# Patient Record
Sex: Male | Born: 1970
Health system: Southern US, Community
[De-identification: ages and names within clinical notes are randomized; demographics above are authoritative.]

## PROBLEM LIST (undated history)

## (undated) DIAGNOSIS — D649 Anemia, unspecified: Secondary | ICD-10-CM

## (undated) DIAGNOSIS — N186 End stage renal disease: Secondary | ICD-10-CM

## (undated) DIAGNOSIS — I499 Cardiac arrhythmia, unspecified: Secondary | ICD-10-CM

## (undated) DIAGNOSIS — J449 Chronic obstructive pulmonary disease, unspecified: Secondary | ICD-10-CM

## (undated) DIAGNOSIS — Z720 Tobacco use: Secondary | ICD-10-CM

## (undated) DIAGNOSIS — I509 Heart failure, unspecified: Secondary | ICD-10-CM

## (undated) DIAGNOSIS — I10 Essential (primary) hypertension: Secondary | ICD-10-CM

## (undated) DIAGNOSIS — J45909 Unspecified asthma, uncomplicated: Secondary | ICD-10-CM

## (undated) DIAGNOSIS — I5032 Chronic diastolic (congestive) heart failure: Secondary | ICD-10-CM

## (undated) DIAGNOSIS — E669 Obesity, unspecified: Secondary | ICD-10-CM

## (undated) DIAGNOSIS — Z992 Dependence on renal dialysis: Secondary | ICD-10-CM

## (undated) HISTORY — DX: Essential (primary) hypertension: I10

## (undated) HISTORY — DX: End stage renal disease: N18.6

## (undated) HISTORY — DX: Anemia, unspecified: D64.9

## (undated) HISTORY — DX: Tobacco use: Z72.0

## (undated) HISTORY — DX: Obesity, unspecified: E66.9

## (undated) HISTORY — DX: Chronic obstructive pulmonary disease, unspecified: J44.9

## (undated) HISTORY — DX: Heart failure, unspecified: I50.9

## (undated) HISTORY — DX: Chronic diastolic (congestive) heart failure: I50.32

## (undated) HISTORY — DX: Dependence on renal dialysis: Z99.2

## (undated) SURGERY — Surgical Case
Anesthesia: *Unknown

---

## 2006-02-15 ENCOUNTER — Emergency Department (HOSPITAL_COMMUNITY): Admission: EM | Admit: 2006-02-15 | Discharge: 2006-02-15 | Payer: Self-pay | Admitting: Emergency Medicine

## 2006-03-02 ENCOUNTER — Emergency Department (HOSPITAL_COMMUNITY): Admission: EM | Admit: 2006-03-02 | Discharge: 2006-03-02 | Payer: Self-pay | Admitting: Emergency Medicine

## 2006-03-11 ENCOUNTER — Inpatient Hospital Stay (HOSPITAL_COMMUNITY): Admission: EM | Admit: 2006-03-11 | Discharge: 2006-03-14 | Payer: Self-pay | Admitting: Emergency Medicine

## 2007-10-12 ENCOUNTER — Ambulatory Visit (HOSPITAL_COMMUNITY): Admission: RE | Admit: 2007-10-12 | Discharge: 2007-10-12 | Payer: Self-pay | Admitting: Nephrology

## 2008-09-26 ENCOUNTER — Emergency Department (HOSPITAL_COMMUNITY): Admission: EM | Admit: 2008-09-26 | Discharge: 2008-09-26 | Payer: Self-pay | Admitting: Emergency Medicine

## 2009-05-24 DIAGNOSIS — I5032 Chronic diastolic (congestive) heart failure: Secondary | ICD-10-CM

## 2009-05-24 HISTORY — PX: ARTERIOVENOUS GRAFT PLACEMENT: SUR1029

## 2009-05-24 HISTORY — DX: Chronic diastolic (congestive) heart failure: I50.32

## 2009-08-02 ENCOUNTER — Inpatient Hospital Stay (HOSPITAL_COMMUNITY)
Admission: EM | Admit: 2009-08-02 | Discharge: 2009-08-07 | Payer: Self-pay | Source: Home / Self Care | Admitting: Emergency Medicine

## 2009-08-04 ENCOUNTER — Encounter (INDEPENDENT_AMBULATORY_CARE_PROVIDER_SITE_OTHER): Payer: Self-pay | Admitting: Nephrology

## 2009-09-01 ENCOUNTER — Ambulatory Visit: Payer: Self-pay | Admitting: Surgery

## 2009-10-14 ENCOUNTER — Ambulatory Visit: Payer: Self-pay | Admitting: Surgery

## 2009-11-05 ENCOUNTER — Ambulatory Visit: Payer: Self-pay | Admitting: Vascular Surgery

## 2009-11-05 ENCOUNTER — Ambulatory Visit (HOSPITAL_COMMUNITY)
Admission: RE | Admit: 2009-11-05 | Discharge: 2009-11-05 | Payer: Self-pay | Source: Home / Self Care | Admitting: Surgery

## 2009-12-25 ENCOUNTER — Ambulatory Visit: Payer: Self-pay | Admitting: Surgery

## 2010-02-11 DIAGNOSIS — I509 Heart failure, unspecified: Secondary | ICD-10-CM | POA: Insufficient documentation

## 2010-07-25 ENCOUNTER — Inpatient Hospital Stay (HOSPITAL_COMMUNITY)
Admission: EM | Admit: 2010-07-25 | Discharge: 2010-07-28 | DRG: 190 | Disposition: A | Discharge status: Home / Self Care | Payer: Medicaid Other | Attending: Internal Medicine | Admitting: Internal Medicine

## 2010-07-25 ENCOUNTER — Emergency Department (HOSPITAL_COMMUNITY): Payer: Medicaid Other

## 2010-07-25 DIAGNOSIS — I5043 Acute on chronic combined systolic (congestive) and diastolic (congestive) heart failure: Secondary | ICD-10-CM | POA: Diagnosis present

## 2010-07-25 DIAGNOSIS — I120 Hypertensive chronic kidney disease with stage 5 chronic kidney disease or end stage renal disease: Secondary | ICD-10-CM | POA: Diagnosis present

## 2010-07-25 DIAGNOSIS — N186 End stage renal disease: Secondary | ICD-10-CM | POA: Diagnosis present

## 2010-07-25 DIAGNOSIS — N039 Chronic nephritic syndrome with unspecified morphologic changes: Secondary | ICD-10-CM | POA: Diagnosis present

## 2010-07-25 DIAGNOSIS — D631 Anemia in chronic kidney disease: Secondary | ICD-10-CM | POA: Diagnosis present

## 2010-07-25 DIAGNOSIS — I509 Heart failure, unspecified: Secondary | ICD-10-CM | POA: Diagnosis present

## 2010-07-25 DIAGNOSIS — J441 Chronic obstructive pulmonary disease with (acute) exacerbation: Principal | ICD-10-CM | POA: Diagnosis present

## 2010-07-25 DIAGNOSIS — F17200 Nicotine dependence, unspecified, uncomplicated: Secondary | ICD-10-CM | POA: Diagnosis present

## 2010-07-25 LAB — BLOOD GAS, ARTERIAL
Acid-base deficit: 2.7 mmol/L — ABNORMAL HIGH (ref 0.0–2.0)
Bicarbonate: 22.2 mEq/L (ref 20.0–24.0)
Drawn by: 23534
FIO2: 0.21 %
O2 Content: 21 L/min
O2 Saturation: 77 %
Patient temperature: 37
TCO2: 20.4 mmol/L (ref 0–100)
pCO2 arterial: 42.7 mmHg (ref 35.0–45.0)
pH, Arterial: 7.336 — ABNORMAL LOW (ref 7.350–7.450)
pO2, Arterial: 45.4 mmHg — ABNORMAL LOW (ref 80.0–100.0)

## 2010-07-25 LAB — DIFFERENTIAL
Basophils Absolute: 0 10*3/uL (ref 0.0–0.1)
Basophils Relative: 0 % (ref 0–1)
Eosinophils Absolute: 0.3 10*3/uL (ref 0.0–0.7)
Eosinophils Relative: 3 % (ref 0–5)
Lymphocytes Relative: 7 % — ABNORMAL LOW (ref 12–46)
Lymphs Abs: 0.8 10*3/uL (ref 0.7–4.0)
Monocytes Absolute: 1 10*3/uL (ref 0.1–1.0)
Monocytes Relative: 9 % (ref 3–12)
Neutro Abs: 8.7 10*3/uL — ABNORMAL HIGH (ref 1.7–7.7)
Neutrophils Relative %: 81 % — ABNORMAL HIGH (ref 43–77)

## 2010-07-25 LAB — CBC
HCT: 37.8 % — ABNORMAL LOW (ref 39.0–52.0)
Hemoglobin: 11.9 g/dL — ABNORMAL LOW (ref 13.0–17.0)
MCH: 27.7 pg (ref 26.0–34.0)
MCHC: 31.5 g/dL (ref 30.0–36.0)
MCV: 88.1 fL (ref 78.0–100.0)
Platelets: 193 10*3/uL (ref 150–400)
RBC: 4.29 MIL/uL (ref 4.22–5.81)
RDW: 15.7 % — ABNORMAL HIGH (ref 11.5–15.5)
WBC: 10.8 10*3/uL — ABNORMAL HIGH (ref 4.0–10.5)

## 2010-07-25 LAB — CARDIAC PANEL(CRET KIN+CKTOT+MB+TROPI)
CK, MB: 15.3 ng/mL (ref 0.3–4.0)
Relative Index: 1.6 (ref 0.0–2.5)
Total CK: 959 U/L — ABNORMAL HIGH (ref 7–232)
Troponin I: 0.04 ng/mL (ref 0.00–0.06)

## 2010-07-25 LAB — PROTIME-INR
INR: 1.06 (ref 0.00–1.49)
Prothrombin Time: 14 seconds (ref 11.6–15.2)

## 2010-07-25 LAB — GLUCOSE, CAPILLARY: Glucose-Capillary: 163 mg/dL — ABNORMAL HIGH (ref 70–99)

## 2010-07-25 LAB — BRAIN NATRIURETIC PEPTIDE: Pro B Natriuretic peptide (BNP): 530 pg/mL — ABNORMAL HIGH (ref 0.0–100.0)

## 2010-07-25 LAB — BASIC METABOLIC PANEL
BUN: 50 mg/dL — ABNORMAL HIGH (ref 6–23)
CO2: 23 mEq/L (ref 19–32)
Calcium: 9 mg/dL (ref 8.4–10.5)
Chloride: 106 mEq/L (ref 96–112)
Creatinine, Ser: 5.7 mg/dL — ABNORMAL HIGH (ref 0.4–1.5)
GFR calc Af Amer: 13 mL/min — ABNORMAL LOW (ref >60)
GFR calc non Af Amer: 11 mL/min — ABNORMAL LOW (ref >60)
Glucose, Bld: 117 mg/dL — ABNORMAL HIGH (ref 70–99)
Potassium: 4.3 mEq/L (ref 3.5–5.1)
Sodium: 139 mEq/L (ref 135–145)

## 2010-07-25 LAB — APTT: aPTT: 34 seconds (ref 24–37)

## 2010-07-25 LAB — POCT CARDIAC MARKERS
CKMB, poc: 12.4 ng/mL (ref 1.0–8.0)
Myoglobin, poc: >500 ng/mL (ref 12–200)
Troponin i, poc: <0.05 ng/mL (ref 0.00–0.09)

## 2010-07-25 LAB — CK TOTAL AND CKMB (NOT AT ARMC)
CK, MB: 15.5 ng/mL (ref 0.3–4.0)
Relative Index: 1.6 (ref 0.0–2.5)
Total CK: 998 U/L — ABNORMAL HIGH (ref 7–232)

## 2010-07-25 LAB — TROPONIN I: Troponin I: 0.03 ng/mL (ref 0.00–0.06)

## 2010-07-25 LAB — MRSA PCR SCREENING: MRSA by PCR: NEGATIVE

## 2010-07-26 LAB — GLUCOSE, CAPILLARY
Glucose-Capillary: 169 mg/dL — ABNORMAL HIGH (ref 70–99)
Glucose-Capillary: 174 mg/dL — ABNORMAL HIGH (ref 70–99)
Glucose-Capillary: 136 mg/dL — ABNORMAL HIGH (ref 70–99)
Glucose-Capillary: 140 mg/dL — ABNORMAL HIGH (ref 70–99)

## 2010-07-26 LAB — CBC
HCT: 36 % — ABNORMAL LOW (ref 39.0–52.0)
Hemoglobin: 11.1 g/dL — ABNORMAL LOW (ref 13.0–17.0)
MCH: 27.5 pg (ref 26.0–34.0)
MCHC: 30.8 g/dL (ref 30.0–36.0)
MCV: 89.3 fL (ref 78.0–100.0)
Platelets: 184 10*3/uL (ref 150–400)
RBC: 4.03 MIL/uL — ABNORMAL LOW (ref 4.22–5.81)
RDW: 15.7 % — ABNORMAL HIGH (ref 11.5–15.5)
WBC: 8.4 10*3/uL (ref 4.0–10.5)

## 2010-07-26 LAB — URINE MICROSCOPIC-ADD ON

## 2010-07-26 LAB — URINALYSIS, ROUTINE W REFLEX MICROSCOPIC
Bilirubin Urine: NEGATIVE
Glucose, UA: NEGATIVE mg/dL
Ketones, ur: NEGATIVE mg/dL
Leukocytes, UA: NEGATIVE
Nitrite: NEGATIVE
Protein, ur: 100 mg/dL — AB
Specific Gravity, Urine: 1.025 (ref 1.005–1.030)
Urobilinogen, UA: 0.2 mg/dL (ref 0.0–1.0)
pH: 5 (ref 5.0–8.0)

## 2010-07-26 LAB — CARDIAC PANEL(CRET KIN+CKTOT+MB+TROPI)
CK, MB: 12.6 ng/mL (ref 0.3–4.0)
CK, MB: 12.7 ng/mL (ref 0.3–4.0)
Relative Index: 1.5 (ref 0.0–2.5)
Relative Index: 1.7 (ref 0.0–2.5)
Total CK: 731 U/L — ABNORMAL HIGH (ref 7–232)
Total CK: 854 U/L — ABNORMAL HIGH (ref 7–232)
Troponin I: 0.03 ng/mL (ref 0.00–0.06)
Troponin I: 0.04 ng/mL (ref 0.00–0.06)

## 2010-07-26 LAB — RENAL FUNCTION PANEL
Albumin: 3.7 g/dL (ref 3.5–5.2)
BUN: 58 mg/dL — ABNORMAL HIGH (ref 6–23)
CO2: 23 mEq/L (ref 19–32)
Calcium: 9 mg/dL (ref 8.4–10.5)
Chloride: 108 mEq/L (ref 96–112)
Creatinine, Ser: 6.03 mg/dL — ABNORMAL HIGH (ref 0.4–1.5)
GFR calc Af Amer: 13 mL/min — ABNORMAL LOW (ref >60)
GFR calc non Af Amer: 10 mL/min — ABNORMAL LOW (ref >60)
Glucose, Bld: 147 mg/dL — ABNORMAL HIGH (ref 70–99)
Phosphorus: 4.2 mg/dL (ref 2.3–4.6)
Potassium: 5 mEq/L (ref 3.5–5.1)
Sodium: 142 mEq/L (ref 135–145)

## 2010-07-26 LAB — RAPID URINE DRUG SCREEN, HOSP PERFORMED
Amphetamines: NOT DETECTED
Barbiturates: NOT DETECTED
Benzodiazepines: NOT DETECTED
Cocaine: NOT DETECTED
Opiates: NOT DETECTED
Tetrahydrocannabinol: NOT DETECTED

## 2010-07-26 LAB — BRAIN NATRIURETIC PEPTIDE: Pro B Natriuretic peptide (BNP): 515 pg/mL — ABNORMAL HIGH (ref 0.0–100.0)

## 2010-07-27 LAB — CBC
HCT: 36.3 % — ABNORMAL LOW (ref 39.0–52.0)
HCT: 36.6 % — ABNORMAL LOW (ref 39.0–52.0)
Hemoglobin: 11.3 g/dL — ABNORMAL LOW (ref 13.0–17.0)
Hemoglobin: 11.3 g/dL — ABNORMAL LOW (ref 13.0–17.0)
MCH: 27.9 pg (ref 26.0–34.0)
MCH: 27.6 pg (ref 26.0–34.0)
MCHC: 31.1 g/dL (ref 30.0–36.0)
MCHC: 30.9 g/dL (ref 30.0–36.0)
MCV: 89.6 fL (ref 78.0–100.0)
MCV: 89.3 fL (ref 78.0–100.0)
Platelets: 185 10*3/uL (ref 150–400)
Platelets: 181 10*3/uL (ref 150–400)
RBC: 4.05 MIL/uL — ABNORMAL LOW (ref 4.22–5.81)
RBC: 4.1 MIL/uL — ABNORMAL LOW (ref 4.22–5.81)
RDW: 15.7 % — ABNORMAL HIGH (ref 11.5–15.5)
RDW: 15.8 % — ABNORMAL HIGH (ref 11.5–15.5)
WBC: 11.2 10*3/uL — ABNORMAL HIGH (ref 4.0–10.5)
WBC: 10.1 10*3/uL (ref 4.0–10.5)

## 2010-07-27 LAB — DIFFERENTIAL
Basophils Absolute: 0 10*3/uL (ref 0.0–0.1)
Basophils Relative: 0 % (ref 0–1)
Eosinophils Absolute: 0 10*3/uL (ref 0.0–0.7)
Eosinophils Relative: 0 % (ref 0–5)
Lymphocytes Relative: 6 % — ABNORMAL LOW (ref 12–46)
Lymphs Abs: 0.6 10*3/uL — ABNORMAL LOW (ref 0.7–4.0)
Monocytes Absolute: 0.3 10*3/uL (ref 0.1–1.0)
Monocytes Relative: 3 % (ref 3–12)
Neutro Abs: 9.2 10*3/uL — ABNORMAL HIGH (ref 1.7–7.7)
Neutrophils Relative %: 92 % — ABNORMAL HIGH (ref 43–77)

## 2010-07-27 LAB — RENAL FUNCTION PANEL
Albumin: 3.6 g/dL (ref 3.5–5.2)
Albumin: 3.7 g/dL (ref 3.5–5.2)
BUN: 75 mg/dL — ABNORMAL HIGH (ref 6–23)
BUN: 80 mg/dL — ABNORMAL HIGH (ref 6–23)
CO2: 22 mEq/L (ref 19–32)
CO2: 20 mEq/L (ref 19–32)
Calcium: 8.6 mg/dL (ref 8.4–10.5)
Calcium: 8.5 mg/dL (ref 8.4–10.5)
Chloride: 104 mEq/L (ref 96–112)
Chloride: 104 mEq/L (ref 96–112)
Creatinine, Ser: 6.46 mg/dL — ABNORMAL HIGH (ref 0.4–1.5)
Creatinine, Ser: 6.67 mg/dL — ABNORMAL HIGH (ref 0.4–1.5)
GFR calc Af Amer: 12 mL/min — ABNORMAL LOW (ref >60)
GFR calc Af Amer: 11 mL/min — ABNORMAL LOW (ref >60)
GFR calc non Af Amer: 10 mL/min — ABNORMAL LOW (ref >60)
GFR calc non Af Amer: 9 mL/min — ABNORMAL LOW (ref >60)
Glucose, Bld: 179 mg/dL — ABNORMAL HIGH (ref 70–99)
Glucose, Bld: 213 mg/dL — ABNORMAL HIGH (ref 70–99)
Phosphorus: 3.9 mg/dL (ref 2.3–4.6)
Phosphorus: 5 mg/dL — ABNORMAL HIGH (ref 2.3–4.6)
Potassium: 4.9 mEq/L (ref 3.5–5.1)
Potassium: 4.6 mEq/L (ref 3.5–5.1)
Sodium: 138 mEq/L (ref 135–145)
Sodium: 137 mEq/L (ref 135–145)

## 2010-07-27 LAB — GLUCOSE, CAPILLARY
Glucose-Capillary: 136 mg/dL — ABNORMAL HIGH (ref 70–99)
Glucose-Capillary: 189 mg/dL — ABNORMAL HIGH (ref 70–99)
Glucose-Capillary: 174 mg/dL — ABNORMAL HIGH (ref 70–99)

## 2010-07-28 LAB — HEPATITIS C ANTIBODY: HCV Ab: NEGATIVE

## 2010-07-28 LAB — BASIC METABOLIC PANEL
BUN: 90 mg/dL — ABNORMAL HIGH (ref 6–23)
CO2: 21 mEq/L (ref 19–32)
Calcium: 8.2 mg/dL — ABNORMAL LOW (ref 8.4–10.5)
Chloride: 102 mEq/L (ref 96–112)
Creatinine, Ser: 6.82 mg/dL — ABNORMAL HIGH (ref 0.4–1.5)
GFR calc Af Amer: 11 mL/min — ABNORMAL LOW (ref >60)
GFR calc non Af Amer: 9 mL/min — ABNORMAL LOW (ref >60)
Glucose, Bld: 169 mg/dL — ABNORMAL HIGH (ref 70–99)
Potassium: 4.7 mEq/L (ref 3.5–5.1)
Sodium: 133 mEq/L — ABNORMAL LOW (ref 135–145)

## 2010-07-28 LAB — CULTURE, RESPIRATORY W GRAM STAIN: Culture: NORMAL

## 2010-07-28 LAB — CBC
HCT: 36.2 % — ABNORMAL LOW (ref 39.0–52.0)
Hemoglobin: 11.3 g/dL — ABNORMAL LOW (ref 13.0–17.0)
MCH: 27.8 pg (ref 26.0–34.0)
MCHC: 31.2 g/dL (ref 30.0–36.0)
MCV: 89.2 fL (ref 78.0–100.0)
Platelets: 200 10*3/uL (ref 150–400)
RBC: 4.06 MIL/uL — ABNORMAL LOW (ref 4.22–5.81)
RDW: 15.8 % — ABNORMAL HIGH (ref 11.5–15.5)
WBC: 9.5 10*3/uL (ref 4.0–10.5)

## 2010-07-28 LAB — GLUCOSE, CAPILLARY
Glucose-Capillary: 162 mg/dL — ABNORMAL HIGH (ref 70–99)
Glucose-Capillary: 153 mg/dL — ABNORMAL HIGH (ref 70–99)
Glucose-Capillary: 175 mg/dL — ABNORMAL HIGH (ref 70–99)

## 2010-07-28 LAB — HEPATITIS B SURFACE ANTIGEN: Hepatitis B Surface Ag: NEGATIVE

## 2010-07-28 LAB — HEPATITIS B CORE ANTIBODY, IGM: Hep B C IgM: NEGATIVE

## 2010-08-02 NOTE — Consult Note (Signed)
NAMERIGOVERTO, STONEBARGER             ACCOUNT NO.:  [id number]  MEDICAL RECORD NO.:  [id number]           PATIENT TYPE:  I  LOCATION:  [id number]                          FACILITY:  [facility]  PHYSICIAN:  Alison Murray, M.D.DATE OF BIRTH:  [birth year]  DATE OF CONSULTATION: DATE OF DISCHARGE:                                CONSULTATION   REASON FOR CONSULTATION:  Worsening of renal failure.  Brian Barrera is a 40 year old gentleman with a history of hypertension and a history of worsening diastolic dysfunction with ejection fraction of 75% and a history of obesity presently who came to the emergency room with complaints of shortness of breath, orthopnea of 2-3 days' duration. According to the patient, also he has cough with yellowish sputum production.  Presently, he said he is feeling better.  He denies any nausea.  No vomiting.  Appetite is overall good.  PAST MEDICAL HISTORY: 1. He has a history of chronic renal failure, stage IV with baseline    creatinine about 5.1 to 5.6 and his GFR calculated was between 12-     13. 2. History of hypertension, usually not controlled that well, most of     it is because of financial issues, and he does not get his     medications. 3. History of diastolic dysfunction. 4. History of systolic dysfunction with ejection fraction of 75%. 5. History of obesity. 6. History of asthma/COPD. 7. History of unequal kidney. 8. History of anemia, possibly related to his renal insufficiency. 9. History of hypertension, the patient is on multiple medications. 10.History of secondary hyperparathyroidism.  Medications at this moment consist of albuterol inhaler 2.5 mg every 6 hours, Norvasc 10 mg p.o. daily, aspirin 81 mg p.o. daily, clonidine 0.3 mg p.o. t.i.d., Mucinex 600 mg p.o. b.i.d., heparin 5000 units subcu q.8 h., and hydralazine 50 mg p.o. t.i.d.  He is also on insulin.  He is on Atrovent 0.5 mg q.6 h., Solu-Medrol 60 mg IV q.12 h., Avelox 400 mg  p.o. daily, Renagel 800 mg p.o. t.i.d. with each meal.  Other medications at this moment are p.r.n. medications.  SOCIAL HISTORY:  He has history of still smoking.  He smokes roughly about a little lesser than a pack a day.  He does not have any history of illicit drug use.  He does not have also any history of alcohol abuse.  FAMILY HISTORY:  His mother used to be on dialysis.  REVIEW OF SYSTEMS:  He seems to be feeling better, but he came with shortness of breath and also orthopnea and paroxysmal nocturnal dyspnea. This is also associated with cough with yellowish sputum production.  He denies any fevers, chills, or sweating.  He denies any chest pain.  He does not have any nausea or vomiting.  Appetite is good.  He does not have any swelling of the legs.  PHYSICAL EXAMINATION:  VITAL SIGNS:  Temperature is 97.2, pulse was 91, blood pressure 170/93. CHEST:  Decreased breath sounds, otherwise seems to be clear.  No rales, no rhonchi.  No egophony.  However, he has expiratory crackles.  He has also some expiratory wheezing. HEART:  Regular  rate and rhythm.  No murmur.  No S3. ABDOMEN:  Positive bowel sounds. EXTREMITIES:  He does not have any edema.  Blood work, his white blood cell count is 10.1, hemoglobin 11.3, hematocrit 76.6, and platelet of 181.  Sodium is 147, potassium 4.6, BUN is 80, creatinine is 6.67.  His albumin is 3.7, calcium 8.5, and phosphorus of 5.  His BNP is 5015.  He has chest x-ray, which showed cardiomegaly with vascular congestion.  ASSESSMENT: 1. Congestive heart failure, probably multifactorial including     uncontrolled salt and fluid intake and also, the patient has severe     post diastolic and systolic dysfunction and presently came with     shortness of breath, and he was found to have congestive heart     failure.  He is on diuretics.  However, the urine output is not     documented.  Hence, very difficult to know how much he has lost.      However, the patient states that he is feeling better. 2. History of asthma/chronic obstructive pulmonary disease.  He is on     inhaler and also he is on antibiotics.  Presently, seems to be     feeling better also. 3. History of hypertension.  The patient is on hydralazine 50 mg p.o.     t.i.d., he is also on clonidine 0.3 mg p.o. b.i.d., amlodipine 10     mg p.o. daily.  Blood pressure still does not seem to be     controlled. 4. History of anemia, thought to be secondary to chronic renal     failure. 5. History of morbid obesity. 6. History of unequal kidney. 7. History of hyperphosphatemia.  He is on a binder.  RECOMMENDATIONS:  At this moment, I agree with diuretics, probably we will use Demadex oral.  I have discussed with him also because of worsening of renal failure plus the patient having severe cardiomyopathy with recurrent CHF, probably he may benefit from dialysis.  At this moment since the patient did not make any decisions, we will follow his blood work and we will follow his input and output.  We will check his hepatitis B surface antigen and also hepatitis surface antibody and core antibody plus we will check also PPD.  We will talk with him tomorrow. If he agrees with dialysis, probably we will continue with that.     Alison Murray, M.D.     BB/MEDQ  D:  07/27/2010  T:  07/28/2010  Job:  [id number]  Electronically Signed by Alison Murray M.D. on 08/02/2010 08:35:06 AM

## 2010-08-04 NOTE — Discharge Summary (Signed)
Brian Barrera, Brian Barrera             ACCOUNT NO.:  [id number]  MEDICAL RECORD NO.:  [id number]           PATIENT TYPE:  I  LOCATION:  [id number]                          FACILITY:  [facility]  PHYSICIAN:  Niel Hummer, MD    DATE OF BIRTH:  14-Jun-1970  DATE OF ADMISSION:  07/25/2010 DATE OF DISCHARGE:  03/06/2012LH                              DISCHARGE SUMMARY   DISCHARGE DIAGNOSES: 1. Asthma/chronic obstructive pulmonary disease exacerbation. 2. Respiratory distress secondary to asthma, chronic obstructive     pulmonary disease exacerbation. 3. End-stage renal disease. 4. Mild exacerbation of systolic and diastolic heart failure, ejection     fraction 35% by 2-D echo in 2011. 5. Uncontrolled hypertension. 6. Elevation of CK-MB and total CK likely secondary to renal failure.     Decreased renal excretion.  OTHER PAST MEDICAL HISTORY: 1. Morbid obesity. 2. Tobacco abuse. 3. Anemia of chronic disease. 4. History of asthma. 5. Prior history of hypertensive encephalopathy.  DISCHARGE MEDICATIONS: 1. Combivent inhaler every 6 hours. 2. Lasix 40 mg tablet 100 mg b.i.d. 3. Guaifenesin 600 mg p.o. b.i.d. 4. Moxifloxacin 400 mg p.o. daily. 5. Prednisone take 3 tablets for 3 days, then 2 tablets for 2 days,     then 1 tablet for 1 day, then half tablet, then stop. 6. Clonidine 0.3 mg p.o.  t.i.d. 7. Aspirin 81 mg p.o. daily. 8. Hydralazine 50 mg one tablet by mouth 3 times a day. 9. Norvasc 10 mg one tablet by mouth daily. 10.Renvela 800 mg one tablet by mouth 3 times a day.  MEDICATIONS:  Stopped during this hospitalization:  Labetalol, potassium chloride, and chlorpheniramine.  DISPOSITION AND FOLLOWUP:  [name] will need to follow with Dr. Lowanda Foster within 1 week.  He will need a BMET to follow renal function and potassium level.  He will be referred to the health self.  For further evaluation of COPD, asthma.  He will need a pulmonary function test.  He had a PPD that was placed  on July 28, 2010.  He was instructed to go to Dr. Florentina Addison office to have reader to go with the emergency department.  BRIEF HISTORY OF PRESENT ILLNESS:  This is a 40 year old with past medical history of end-stage renal disease, systolic, and diastolic heart failure, ejection fraction 25%.  Prior history of asthma, current smoker who presents complaining of shortness of breath.  He related productive cough yellow sputum that started the night prior to admission.  He is having shortness of breath and has been progressively getting worse accompanied by wheezing.  He was using albuterol inhaler that he got from his girlfriend, he had some relief with albuterol use. He relates some chest pain when he coughs.  STUDIES PERFORMED:  Chest x-ray show cardiomegaly with vascular congestion.  CONSULTANT:  Nephrologist, Dr. Lowanda Foster.  HOSPITAL COURSE: 1. Respiratory failure.  The patient presented with shortness of     breath very tachypneic, it was thought to be multifactorial     secondary to mild CHF exacerbation plus a component of asthma and     COPD exacerbation.  The patient was admitted to step-down unit.  He  was started on Solu-Medrol, albuterol, ipratropium, and he received     a dose of Lasix.  During hospitalization, his respiratory failure     resolved.  His oxygen saturation on the date of discharge was at 96     on room air. 2. Asthma and COPD exacerbation.  He will be discharged on Combivent.     He will need to follow up with primary care physician.  Followup     arrangement will be made.  He will need a pulmonary function test.     He was counseled regarding smoking. 3. Hypertension.  uncontrol will continue with hydralazine,     clonidine, amlodipine.  A clonidine was increased to t.i.d..  He     will need to follow with his nephrologist for further adjustment of     medications.  Labetalol was stopped because of acute asthma     exacerbation.  We will need to wait  the patient asthma and COPD be     better controlled prior to starting him back on Labetalol. 4. End-stage renal disease.  The patient received a dose of Lasix for     mild CHF exacerbation.  Because during this hospitalization his      creatinine increased and his renal function was worsening,     Dr. Lowanda Foster was consulted.  He recommended start Lasix 100 mg p.o.     b.i.d.  He discussed with the patient option for dialysis but the     patient has declined at this time.  The patient will follow up with     Dr. Lowanda Foster for further care. 5. History of systolic and diastolic heart failure.  The patient was     restarted on Lasix 100 mg p.o. b.i.d.  No shortness of breath,     heart failure at this time compensated.  On the day of discharge,     the patient was in improved condition.  He denies shortness of     breath.  He is feeling very well.  Blood pressure 156/80, sat 96 on     room air, respirations 20, pulse 82, temperature 98.2.  Hepatitis B     core antibody negative.  CBC white blood cell 9.5, hemoglobin 11.3,     hematocrit 36.2, platelets 200.  Sodium 133, potassium 4.7,     chloride 102, CO2 of 21, glucose 169, BUN 90, and creatinine 6.18.     The patient was discharged in improved condition.     Niel Hummer, MD     BR/MEDQ  D:  07/28/2010  T:  07/29/2010  Job:  [id number]  Electronically Signed by Niel Hummer MD on 08/04/2010 09:39:37 AM

## 2010-08-04 NOTE — H&P (Signed)
NAMEBREN, Brian Barrera             ACCOUNT NO.:  [id number]  MEDICAL RECORD NO.:  [id number]           PATIENT TYPE:  I  LOCATION:  IC02                          FACILITY:  [facility]  PHYSICIAN:  Niel Hummer, MD    DATE OF BIRTH:  1970-12-24  DATE OF ADMISSION:  07/25/2010 DATE OF DISCHARGE:  LH                             HISTORY & PHYSICAL   PRIMARY CARE PHYSICIAN:  No physician.  NEPHROLOGIST:  Alison Murray, MD.  CHIEF COMPLAINT:  Shortness of breath.  HISTORY OF PRESENT ILLNESS:  This is a 40 year old African-American with past medical history significant for systolic and diastolic heart failure, ejection fraction at 35% by 2-D echo on March 2011, a history of asthma, current smoker, end-stage renal disease, not on dialysis, who presented to the emergency department complaining of shortness of breath.  The patient relates cough productive yellow sputum that started the night prior to admission.  He said to have shortness of breath. Also that started the night prior to admission is that they are progressively getting worse.  Shortness of breath has been getting progressively worse accompanied by wheezing.  He use albuterol inhaler that he got from his girlfriend.  He use it twice with some relief.  He relates some chest tightness and abdominal pain when he cough.  He denies fever or diarrhea.  Denies worsening lower extremity edema.  He has been taken his medications.  In the emergency department, he received nitroglycerin ointment, albuterol, and prednisone.  He relates improvement after medications were provided.  ALLERGIES:  No known drug allergies.  PAST MEDICAL HISTORY: 1. Systolic and diastolic heart failure, ejection fraction 35%, on 2-D     echo on March 2011. 2. End-stage renal disease. 3. Hypertension. 4. Morbid obesity. 5. Tobacco abuse. 6. Anemia of chronic disease. 7. History of asthma. 8. Prior history of hypertensive encephalopathy.  HOME  MEDICATIONS: 1. Hydralazine 50 mg p.o. t.i.d. 2. Clonidine 0.3 mg p.o. b.i.d. 3. Renvela 800 mg. 4. Amlodipine 10 mg. 5. Labetalol 200 mg p.o. b.i.d. 6. Zemplar 1 mcg. 7. Potassium supplement.  SOCIAL HISTORY:  He is on disability.  He denies alcohol or recreational drugs.  He is current smoker, smokes half a pack per day for the last 20 years.  He is single.  He has a girlfriend, he has three children.  FAMILY HISTORY:  Mother and father are deceased.  Her mother had a prior history of diabetes and hypertension.  Father died of lung disease.  He was not able to specify.  REVIEW OF SYSTEMS:  Negative except as per HPI.  PHYSICAL EXAMINATION:  VITALS:  Sat 94 on 2 L, pulse 100-99, blood pressure 183/91, respirations 24, temperature 98.8. GENERAL:  The patient lying in bed, in no acute distress, speaking in full sentences, not in acute distress. HEENT:  Head, atraumatic and normocephalic.  Eyes, anicteric.  Pupils equal and reactive to light.  Extraocular muscle intact. NECK:  Supple.  JVD difficult to assess due to thick neck, no thyromegaly. CARDIOVASCULAR:  S1 and S2, regular rhythm and rate.  No rub, murmurs, or gallops. LUNGS:  Bilateral diffuse wheezes and bilateral  crackles at the bases. ABDOMEN:  Bowel sounds positive, soft, nontender, and nondistended.  No rigidity, no guarding. EXTREMITY:  Pulse present +1 edema. NEURO EXAM:  Nonfocal.  LABORATORY DATA:  Admission labs, troponin 0.03, sodium 139, potassium 4.3, chloride 106, bicarb 23, glucose 117, BUN 50, creatinine 5.7, calcium 9.0, total CK 998, CK-MB 15.5.  ABG, it was reported as an arterial, but ED physician said that it was venous, pH 7.3, PCO2 of 42, PO2 of 45.  BNP 530.  CBC:  White blood cell 10.8, hemoglobin 11.9, platelet 193, troponin 0.05.  RADIOGRAPHIC STUDIES:  Chest x-ray:  Cardiomegaly with vascular congestion.  ASSESSMENT AND PLAN:  This is a 40 year old that presented with productive cough,  shortness of breath, wheezing on lung exam. 1. Asthma exacerbation versus chronic obstructive pulmonary disease     exacerbation.  The patient presents complaining of shortness of     breath, productive cough, wheezing on lung exam, current smoker.     We will admit to the [facility].  We will start Solu-Medrol,     Atrovent, albuterol, Avelox to cover for any infectious process.     He will need pulmonary function test as an outpatient.  He will     need a smoking cessation counseling.  Less likely that his     shortness of breath is secondary to congestive heart failure     exacerbation.  His BNP is lower than his baseline, no worsening     lower extremity edema.  Chest x-ray only showed some mild vascular     congestion.  He takes Lasix as needed.  I will give him a dose     today. 2. Respiratory distress, improved at this time, likely secondary to     asthma exacerbation versus a component of chronic obstructive     pulmonary disease.  We will admit the patient to the [facility].     Please refer to asthma exacerbation versus chronic obstructive     pulmonary disease exacerbation. 3. End-stage renal disease, not on dialysis yet.  His creatinine is at     5.7.  Prior records show a creatinine at 5.1.  I will give him a     dose of Lasix today.  We will monitor renal function closely. 4. History of systolic and diastolic heart failure.  We will give one     dose of Lasix.  Less likely, this is a heart failure exacerbation.     I will continue with hydralazine.  I will hold at this time     labetalol due to possible asthma and chronic obstructive pulmonary     disease  exacerbation. 5. Increased CK-MB, total CK.  This is likely secondary to decreased     excretion secondary to renal failure.  We will monitor for now.     Troponin is negative at this time. 6. Prolonged QT, chronic.  We will repeat EKG in the morning. 7. For deep venous thrombosis prophylaxis, Lovenox. 8. Uncontrolled  hypertension.  His hypertension has been in the     180/170.  I will restart his hydralazine 50 mg p.o. t.i.d., Norvasc     10 mg.  We will continue with clonidine 0.3 mg p.o. b.i.d.  We will     hold labetalol due to asthma exacerbation.  The patient received     nitroglycerin ointment in the emergency department.     Niel Hummer, MD     BR/MEDQ  D:  07/25/2010  T:  07/25/2010  Job:  [id number]  Electronically Signed by Niel Hummer MD on 08/04/2010 09:42:49 AM

## 2010-08-10 LAB — POCT I-STAT 4, (NA,K, GLUC, HGB,HCT)
Glucose, Bld: 107 mg/dL — ABNORMAL HIGH (ref 70–99)
HCT: 42 % (ref 39.0–52.0)
Hemoglobin: 14.3 g/dL (ref 13.0–17.0)
Potassium: 4.2 mEq/L (ref 3.5–5.1)
Sodium: 146 mEq/L — ABNORMAL HIGH (ref 135–145)

## 2010-08-16 LAB — DIFFERENTIAL
Basophils Absolute: 0 10*3/uL (ref 0.0–0.1)
Basophils Absolute: 0 10*3/uL (ref 0.0–0.1)
Basophils Absolute: 0 10*3/uL (ref 0.0–0.1)
Basophils Relative: 1 % (ref 0–1)
Basophils Relative: 1 % (ref 0–1)
Basophils Relative: 1 % (ref 0–1)
Eosinophils Absolute: 0.1 10*3/uL (ref 0.0–0.7)
Eosinophils Absolute: 0.2 10*3/uL (ref 0.0–0.7)
Eosinophils Absolute: 0.2 10*3/uL (ref 0.0–0.7)
Eosinophils Relative: 1 % (ref 0–5)
Eosinophils Relative: 4 % (ref 0–5)
Eosinophils Relative: 5 % (ref 0–5)
Lymphocytes Relative: 12 % (ref 12–46)
Lymphocytes Relative: 21 % (ref 12–46)
Lymphocytes Relative: 25 % (ref 12–46)
Lymphs Abs: 1 10*3/uL (ref 0.7–4.0)
Lymphs Abs: 1.2 10*3/uL (ref 0.7–4.0)
Lymphs Abs: 1.3 10*3/uL (ref 0.7–4.0)
Monocytes Absolute: 0.4 10*3/uL (ref 0.1–1.0)
Monocytes Absolute: 0.4 10*3/uL (ref 0.1–1.0)
Monocytes Absolute: 0.7 10*3/uL (ref 0.1–1.0)
Monocytes Relative: 7 % (ref 3–12)
Monocytes Relative: 8 % (ref 3–12)
Monocytes Relative: 8 % (ref 3–12)
Neutro Abs: 2.8 10*3/uL (ref 1.7–7.7)
Neutro Abs: 4.1 10*3/uL (ref 1.7–7.7)
Neutro Abs: 6.7 10*3/uL (ref 1.7–7.7)
Neutrophils Relative %: 61 % (ref 43–77)
Neutrophils Relative %: 68 % (ref 43–77)
Neutrophils Relative %: 79 % — ABNORMAL HIGH (ref 43–77)

## 2010-08-16 LAB — BASIC METABOLIC PANEL
BUN: 58 mg/dL — ABNORMAL HIGH (ref 6–23)
BUN: 59 mg/dL — ABNORMAL HIGH (ref 6–23)
BUN: 59 mg/dL — ABNORMAL HIGH (ref 6–23)
BUN: 64 mg/dL — ABNORMAL HIGH (ref 6–23)
BUN: 66 mg/dL — ABNORMAL HIGH (ref 6–23)
CO2: 25 mEq/L (ref 19–32)
CO2: 25 mEq/L (ref 19–32)
CO2: 27 mEq/L (ref 19–32)
CO2: 27 mEq/L (ref 19–32)
CO2: 27 mEq/L (ref 19–32)
Calcium: 8.4 mg/dL (ref 8.4–10.5)
Calcium: 8.8 mg/dL (ref 8.4–10.5)
Calcium: 8.9 mg/dL (ref 8.4–10.5)
Calcium: 8.9 mg/dL (ref 8.4–10.5)
Calcium: 8.9 mg/dL (ref 8.4–10.5)
Chloride: 105 mEq/L (ref 96–112)
Chloride: 105 mEq/L (ref 96–112)
Chloride: 107 mEq/L (ref 96–112)
Chloride: 107 mEq/L (ref 96–112)
Chloride: 107 mEq/L (ref 96–112)
Creatinine, Ser: 5.17 mg/dL — ABNORMAL HIGH (ref 0.4–1.5)
Creatinine, Ser: 5.31 mg/dL — ABNORMAL HIGH (ref 0.4–1.5)
Creatinine, Ser: 5.36 mg/dL — ABNORMAL HIGH (ref 0.4–1.5)
Creatinine, Ser: 5.42 mg/dL — ABNORMAL HIGH (ref 0.4–1.5)
Creatinine, Ser: 5.66 mg/dL — ABNORMAL HIGH (ref 0.4–1.5)
GFR calc Af Amer: 14 mL/min — ABNORMAL LOW (ref >60)
GFR calc Af Amer: 14 mL/min — ABNORMAL LOW (ref >60)
GFR calc Af Amer: 15 mL/min — ABNORMAL LOW (ref >60)
GFR calc Af Amer: 15 mL/min — ABNORMAL LOW (ref >60)
GFR calc Af Amer: 15 mL/min — ABNORMAL LOW (ref >60)
GFR calc non Af Amer: 11 mL/min — ABNORMAL LOW (ref >60)
GFR calc non Af Amer: 12 mL/min — ABNORMAL LOW (ref >60)
GFR calc non Af Amer: 12 mL/min — ABNORMAL LOW (ref >60)
GFR calc non Af Amer: 12 mL/min — ABNORMAL LOW (ref >60)
GFR calc non Af Amer: 12 mL/min — ABNORMAL LOW (ref >60)
Glucose, Bld: 107 mg/dL — ABNORMAL HIGH (ref 70–99)
Glucose, Bld: 113 mg/dL — ABNORMAL HIGH (ref 70–99)
Glucose, Bld: 113 mg/dL — ABNORMAL HIGH (ref 70–99)
Glucose, Bld: 117 mg/dL — ABNORMAL HIGH (ref 70–99)
Glucose, Bld: 118 mg/dL — ABNORMAL HIGH (ref 70–99)
Potassium: 3.1 mEq/L — ABNORMAL LOW (ref 3.5–5.1)
Potassium: 3.4 mEq/L — ABNORMAL LOW (ref 3.5–5.1)
Potassium: 3.7 mEq/L (ref 3.5–5.1)
Potassium: 3.9 mEq/L (ref 3.5–5.1)
Potassium: 4 mEq/L (ref 3.5–5.1)
Sodium: 141 mEq/L (ref 135–145)
Sodium: 141 mEq/L (ref 135–145)
Sodium: 141 mEq/L (ref 135–145)
Sodium: 142 mEq/L (ref 135–145)
Sodium: 144 mEq/L (ref 135–145)

## 2010-08-16 LAB — HEPATITIS PANEL, ACUTE
HCV Ab: NEGATIVE
Hep A IgM: NEGATIVE
Hep B C IgM: NEGATIVE
Hepatitis B Surface Ag: NEGATIVE

## 2010-08-16 LAB — URINALYSIS, ROUTINE W REFLEX MICROSCOPIC
Bilirubin Urine: NEGATIVE
Glucose, UA: NEGATIVE mg/dL
Ketones, ur: NEGATIVE mg/dL
Leukocytes, UA: NEGATIVE
Nitrite: NEGATIVE
Protein, ur: 30 mg/dL — AB
Specific Gravity, Urine: 1.01 (ref 1.005–1.030)
Urobilinogen, UA: 0.2 mg/dL (ref 0.0–1.0)
pH: 5.5 (ref 5.0–8.0)

## 2010-08-16 LAB — COMPREHENSIVE METABOLIC PANEL
ALT: 29 U/L (ref 0–53)
AST: 22 U/L (ref 0–37)
Albumin: 2.9 g/dL — ABNORMAL LOW (ref 3.5–5.2)
Alkaline Phosphatase: 64 U/L (ref 39–117)
BUN: 66 mg/dL — ABNORMAL HIGH (ref 6–23)
CO2: 26 mEq/L (ref 19–32)
Calcium: 8.7 mg/dL (ref 8.4–10.5)
Chloride: 107 mEq/L (ref 96–112)
Creatinine, Ser: 5.91 mg/dL — ABNORMAL HIGH (ref 0.4–1.5)
GFR calc Af Amer: 13 mL/min — ABNORMAL LOW (ref >60)
GFR calc non Af Amer: 11 mL/min — ABNORMAL LOW (ref >60)
Glucose, Bld: 118 mg/dL — ABNORMAL HIGH (ref 70–99)
Potassium: 3.2 mEq/L — ABNORMAL LOW (ref 3.5–5.1)
Sodium: 143 mEq/L (ref 135–145)
Total Bilirubin: 1.3 mg/dL — ABNORMAL HIGH (ref 0.3–1.2)
Total Protein: 5.3 g/dL — ABNORMAL LOW (ref 6.0–8.3)

## 2010-08-16 LAB — CBC
HCT: 39.6 % (ref 39.0–52.0)
HCT: 40.9 % (ref 39.0–52.0)
HCT: 44.1 % (ref 39.0–52.0)
Hemoglobin: 13.1 g/dL (ref 13.0–17.0)
Hemoglobin: 13.6 g/dL (ref 13.0–17.0)
Hemoglobin: 14.4 g/dL (ref 13.0–17.0)
MCHC: 32.5 g/dL (ref 30.0–36.0)
MCHC: 33 g/dL (ref 30.0–36.0)
MCHC: 33.2 g/dL (ref 30.0–36.0)
MCV: 86.3 fL (ref 78.0–100.0)
MCV: 86.4 fL (ref 78.0–100.0)
MCV: 86.5 fL (ref 78.0–100.0)
Platelets: 199 10*3/uL (ref 150–400)
Platelets: 200 10*3/uL (ref 150–400)
Platelets: 213 10*3/uL (ref 150–400)
RBC: 4.58 MIL/uL (ref 4.22–5.81)
RBC: 4.73 MIL/uL (ref 4.22–5.81)
RBC: 5.11 MIL/uL (ref 4.22–5.81)
RDW: 15.6 % — ABNORMAL HIGH (ref 11.5–15.5)
RDW: 15.7 % — ABNORMAL HIGH (ref 11.5–15.5)
RDW: 15.8 % — ABNORMAL HIGH (ref 11.5–15.5)
WBC: 4.7 10*3/uL (ref 4.0–10.5)
WBC: 6.1 10*3/uL (ref 4.0–10.5)
WBC: 8.5 10*3/uL (ref 4.0–10.5)

## 2010-08-16 LAB — SEDIMENTATION RATE: Sed Rate: 13 mm/hr (ref 0–16)

## 2010-08-16 LAB — HIV ANTIBODY (ROUTINE TESTING W REFLEX): HIV: NONREACTIVE

## 2010-08-16 LAB — PHOSPHORUS
Phosphorus: 4.8 mg/dL — ABNORMAL HIGH (ref 2.3–4.6)
Phosphorus: 5.3 mg/dL — ABNORMAL HIGH (ref 2.3–4.6)
Phosphorus: 5.3 mg/dL — ABNORMAL HIGH (ref 2.3–4.6)
Phosphorus: 5.4 mg/dL — ABNORMAL HIGH (ref 2.3–4.6)

## 2010-08-16 LAB — ANTI-NEUTROPHIL ANTIBODY

## 2010-08-16 LAB — POCT CARDIAC MARKERS
CKMB, poc: 5.4 ng/mL (ref 1.0–8.0)
Myoglobin, poc: 452 ng/mL (ref 12–200)
Troponin i, poc: <0.05 ng/mL (ref 0.00–0.09)

## 2010-08-16 LAB — BRAIN NATRIURETIC PEPTIDE
Pro B Natriuretic peptide (BNP): 1150 pg/mL — ABNORMAL HIGH (ref 0.0–100.0)
Pro B Natriuretic peptide (BNP): 1200 pg/mL — ABNORMAL HIGH (ref 0.0–100.0)
Pro B Natriuretic peptide (BNP): 2780 pg/mL — ABNORMAL HIGH (ref 0.0–100.0)
Pro B Natriuretic peptide (BNP): 841 pg/mL — ABNORMAL HIGH (ref 0.0–100.0)
Pro B Natriuretic peptide (BNP): 874 pg/mL — ABNORMAL HIGH (ref 0.0–100.0)
Pro B Natriuretic peptide (BNP): >3200 pg/mL — ABNORMAL HIGH (ref 0.0–100.0)

## 2010-08-16 LAB — MPO/PR-3 (ANCA) ANTIBODIES

## 2010-08-16 LAB — ALBUMIN: Albumin: 3 g/dL — ABNORMAL LOW (ref 3.5–5.2)

## 2010-08-16 LAB — PTH, INTACT AND CALCIUM
Calcium, Total (PTH): 8.8 mg/dL (ref 8.4–10.5)
PTH: 270 pg/mL — ABNORMAL HIGH (ref 14.0–72.0)

## 2010-08-16 LAB — MRSA PCR SCREENING: MRSA by PCR: NEGATIVE

## 2010-08-16 LAB — URINE MICROSCOPIC-ADD ON

## 2010-08-16 LAB — PROTEIN, URINE, 24 HOUR
Collection Interval-UPROT: 24 hours
Protein, 24H Urine: 1209 mg/d — ABNORMAL HIGH (ref 50–100)
Urine Total Volume-UPROT: 3100 mL

## 2010-08-16 LAB — ANA: Anti Nuclear Antibody(ANA): NEGATIVE

## 2010-10-06 NOTE — Procedures (Signed)
CEPHALIC VEIN MAPPING   INDICATION:  Preoperative arteriovenous fistula placement.   HISTORY:   EXAM:   The right cephalic vein is compressible.   Diameter measurements range from 0.16 to 0.29 cm.   The right basilic vein is compressible.   Diameter measurements range from 0.21 to 0.40 cm.   The left cephalic vein is compressible.   Diameter measurements range from 0.15 to 0.22 cm.   The left basilic vein is compressible.   Diameter measurements range from 0.19 to 0.23 cm.   See attached worksheet for all measurements.   IMPRESSION:  The patient's bilateral cephalic veins are not of  acceptable diameter for use as a dialysis access site.   The patient's bilateral basilic veins are marginally of acceptable  diameter for use as a dialysis access site.        ___________________________________________  V. Leia Alf, MD   CB/MEDQ  D:  09/01/2009  T:  09/02/2009  Job:  [id number]

## 2010-10-06 NOTE — Assessment & Plan Note (Signed)
OFFICE VISIT   Brian Barrera, Brian Barrera  DOB:  1971-02-01                                       09/01/2009  CHART#:[id number]   REASON FOR VISIT:  Access planning.   REFERRING PHYSICIAN:  Dr. Lowanda Foster.   HISTORY:  This is a 40 year old gentleman with chronic kidney disease,  stage IV, who is nearing dialysis.  He is right-handed.  His renal  failure is secondary to hypertension.  He has been admitted multiple  times to the hospital for hypertensive urgency.  He is now having  complaints of swelling in his legs.  He was admitted to the hospital for  this in March 2011.  He is not having shortness of breath at this time.  The patient continues to be a smoker.  He smokes a half pack a day.   REVIEW OF SYSTEMS:  Positive for leg swelling.  Negative for shortness  of breath.  Negative for chest pain.  All other review systems are  negative as documented in the encounter form.   PAST MEDICAL HISTORY:  Hypertension.   FAMILY HISTORY:  Positive for renal disease in his mother.   SOCIAL HISTORY:  He is single with 3 children.  Currently smokes a half  pack a day.  Does not drink alcohol.   ALLERGIES:  None.   PHYSICAL EXAMINATION:  Heart rate 73, blood pressure 164/113,  temperature is 98.0.  General:  He is well-appearing in no distress.  HEENT:  Within normal limits.  Lungs:  Respirations are nonlabored.  Cardiovascular:  Regular rate and rhythm.  He has a palpable left  brachial and left radial pulse.  Abdomen is obese and soft.  Musculoskeletal:  Without major deformities.  Neuro:  He has no focal  deficits or weakness.  Skin is without rash.   DIAGNOSTIC STUDIES:  Vein mapping was performed today.  The patient's  cephalic and basilic veins are not adequate for fistula.   Chronic kidney disease, needing access.   PLAN:  The patient is scheduled for a left forearm graft, as he is not a  candidate for a fistula.  I discussed the details of the procedure,  including the risk of infection, the risk of steal syndrome.  The  patient is going to contact me for the time of his operation.Eldridge Abrahams, MD  Electronically Signed   VWB/MEDQ  D:  09/01/2009  T:  09/02/2009  Job:  [id number]   cc:   Tesfaye D. Legrand Rams, MD  Dr. Lowanda Foster

## 2010-10-09 NOTE — Group Therapy Note (Signed)
[name], Brian Barrera             ACCOUNT NO.:  [id number]   MEDICAL RECORD NO.:  [id number]          PATIENT TYPE:  INP   LOCATION:  A204                          FACILITY:  [facility]   PHYSICIAN:  Audria Nine, M.D.DATE OF BIRTH:  1970/05/30   DATE OF PROCEDURE:  03/12/2006  DATE OF DISCHARGE:                                   PROGRESS NOTE   SUBJECTIVE:  Patient feels much better today.  He has no more nausea or  vomiting, no more vertigo.  He denies any chest pain.  No shortness of  breath.  Patient was admitted with what appears to be hypertensive  encephalopathy yesterday.  Patient has refractory hypertension and he is on  multiple medications but has very poor compliance.  Patient also has some  polysubstance abuse issues including alcohol and cigarettes.   OBJECTIVE:  GENERAL:  Conscious, alert, comfortable, not in acute distress.  VITAL SIGNS:  Blood pressure ranging between 170-160/70-90, respiratory rate  of 16, temperature 98.5, oxygen saturation 98% on room air.  HEENT:  Normocephalic, atraumatic.  Oral mucosa was moist.  No exudates.  NECK:  Supple.  No JVD or lymphadenopathy.  LUNGS:  Clear clinically.  Good air entry bilaterally.  HEART:  S1 and S2 regular.  No S3, S4 gallops or rubs.  ABDOMEN:  Soft, nontender.  Bowel sounds positive.  No masses palpable.  EXTREMITIES:  No pitting pedal edema.  No calf induration or tenderness.  CNS:  Grossly intact with no focal neurological deficits.   LABORATORY/DIAGNOSTIC DATA:  White blood cell count was 7.0, hemoglobin  14.4, hematocrit 42.7, platelet count 194 with no left shift.  Sodium 137,  potassium 3.4, chloride 103, CO2 of 26, glucose 104, BUN of 15, creatinine  1.5, calcium 8.8.  Hemoglobin A1c was 6.2.  Cardiac enzymes were negative.  LDL was 123, total cholesterol 188, triglycerides 67, cholesterol was 52.  Homocystine level was 11.6.  TSH was 0.546.   1. Hypertensive encephalopathy.  Patient has improved.  Blood  pressure has      returned to normal, and patient is off the nitroprusside infusion.      Would increase his Norvasc to 10 mg p.o. once a day.  I am not so sure      that clonidine will be a good choice of antihypertensive in this      patient, given his history of poor compliance and risk of rebound      hypertension with noncompliance.  Will evaluate this as he is      hospitalized and we have more information about his compliance.  Will      continue all the other medications at this time.  2. Probable borderline diabetes.  I discussed this with him.  I suspect      type 2 diabetes as patient is fairly overweight.  We will continue      order a diabetic diet for now.  I have informed him that he would need      a weight loss and lifestyle changes to prevent rapid acceleration to      diabetes mellitus.  Audria Nine, M.D.  Electronically Signed    AM/MEDQ  D:  03/12/2006  T:  03/12/2006  Job:  [id number]

## 2010-10-09 NOTE — H&P (Signed)
NAME:  Brian Barrera, AKRIDGE NO.:  [id number]   MEDICAL RECORD NO.:  [id number]          PATIENT TYPE:  INP   LOCATION:  IC04                          FACILITY:  [facility]   PHYSICIAN:  Edythe Lynn, M.D.       DATE OF BIRTH:  1970-12-12   DATE OF ADMISSION:  03/11/2006  DATE OF DISCHARGE:  LH                                HISTORY & PHYSICAL   PRIMARY CARE PHYSICIAN:  The patient's primary care physician is in  [place], [place].   CHIEF COMPLAINT:  Dizziness.   HISTORY OF PRESENT ILLNESS:  Brian Barrera is a 40 year old African American  gentleman known with severe hypertension on five different antihypertensive  medications at home, that presented to [facility] Emergency Room after  sudden onset of vertigo, nausea and vomiting on the day of admission.  The  symptoms were so severe that the patient just could not stand on his feet  and could not eat anything so he had to come to the Emergency Room.  In the  Emergency Room he was found to have elevated blood pressure levels and it  was considered that he may have hypertensive encephalopathy so he was  started on repeat doses of antihypertensives to improve his symptoms.  I was  called to admit the patient to the hospital and by the time I am arriving  the patient reports that his vertigo has improved.  When asked if he is  compliant with the medications the patient adamantly tells me that he is,  although his girl friend was sitting right next to him at the bedside is  shaking her head.   PAST MEDICAL HISTORY:  1. Hypertension.  2. Probable mild asthma.  3. Recent episode of cellulitis of the back that has completely resolved.   HOME MEDICATIONS:  1. Aspirin 81 mg daily.  2. Micardis/HCTZ 40/12.5 mg daily.  3. Hydralazine 100 mg three times a day.  4. Labetalol 300 mg three times a day.  5. Lisinopril 40 mg twice a day.   SOCIAL HISTORY:  The patient smokes 1/2 pack of cigarettes a day.  He  occasionally drinks  alcohol, beer and liquor.  In terms of quantity, he  claims very little to none but his girl friend at the bedside says that he  drinks much more than he is claiming to.  The patient works in a [employer].  He is single.  He does have three children.   FAMILY HISTORY:  Patient's mother died at age 56 with diabetes,  hypertension, end stage renal disease.  The patient's father died in his  64's of unknown cause.  The patient has three brothers and three sisters  alive, two of them have severe hypertension.   REVIEW OF SYSTEMS:  As per HPI.  The patient also denies chest pain.  Denies  abdominal pain.  Reports some occasional lower extremity swelling.  All  other systems are negative.   PHYSICAL EXAMINATION:  VITAL SIGNS:  The patient is afebrile with blood  pressure of 206/136.  Pulse 73, respirations 19, saturation 97% on room  air.  GENERAL APPEARANCE:  Obese African American gentleman in no acute distress  lying on a stretcher with eyes closed.  Calm and cooperative, in no acute  distress.  EYES:  Pupils are equal, round and reactive to light and accommodation.  Fundi as far as I can tell, they do not have major papilledema.  Sclerae  anicteric.  Conjunctivae pink.  Throat is clear.  NECK:  Supple.  No jugular venous distention, no carotid bruits.  CHEST:  Clear to auscultation bilaterally without wheezes, rhonchi or  crackles.  HEART:  Regular rate and rhythm without murmurs, gallops or rubs.  ABDOMEN:  The patient's abdomen is soft, nontender, nondistended.  Bowel  sounds are present.  EXTREMITIES:  +1 trace edema.  MUSCULOSKELETAL:  The patient has increased muscle bulk and tone due to his  constitution.  NEUROLOGIC:  Roughly as best I can tell the patient has 5/5 strength, intact  sensation but he is not willing to participate in a full neurological exam  at this point in time.   LABORATORY DATA:  Sodium 140, potassium 3.8, chloride 108, bicarb 27, BUN  17,  creatinine 1.8, calcium 9.5, glucose 179, white blood cell count 10.2,  hemoglobin 14.7, hematocrit 42.6, platelet count 229.  EKG shows severe left  ventricular hypertrophy with repolarization changes.   ASSESSMENT/PLAN:  1. Acute onset nausea and vomiting and vertigo.  The symptoms could be      related to hypertensive encephalopathy but they also could be related      to lacunar stroke.  The patient will be admitted to the Intensive Care      Unit with careful monitoring of his neurological status and vital      signs.  The patient will have an MRI of the brain to further delineate      intracranial process.  At this point in time the patient is on      Nitroprusside drip which I have indicated not to be titrated further      since the blood pressure is already decreased significantly.  The      patient will be resumed on Hydralazine, Labetalol and Lisinopril in the      hospital and he will be observed closely for titration as indicated for      the medications.  Also will check fasting lipid panel, cardiac enzymes,      hemoglobin A1C, TSH, urine drug screen and urinalysis.  In regards to      his tobacco abuse will place the patient on nicotine patch and give him      smoking cessation consultation.  2. Elevated creatinine.  This is probably related to chronic kidney      disease secondary to the hypertrophy the patient is experiencing.      Unfortunately I do not have a baseline creatinine but I do not suspect      this is an acute process for the patient.  In any case, I will hold      angiotensin receptor blocker and recheck a creatinine tomorrow.      Edythe Lynn, M.D.  Electronically Signed     SL/MEDQ  D:  03/11/2006  T:  03/11/2006  Job:  [id number]   cc:   Dr. Alroy Dust - [place]

## 2010-10-09 NOTE — Procedures (Signed)
[name], Brian Barrera             ACCOUNT NO.:  [id number]   MEDICAL RECORD NO.:  [id number]          PATIENT TYPE:  INP   LOCATION:  A204                          FACILITY:  [facility]   PHYSICIAN:  Edward L. Luan Pulling, M.D.DATE OF BIRTH:  31-Mar-1971   DATE OF PROCEDURE:  03/11/2006  DATE OF DISCHARGE:                                EKG INTERPRETATION   Time 4:40, March 11, 2006.  The rhythm is sinus rhythm with a rate in the  70s.  There is right atrial enlargement.  There is left ventricular  hypertrophy with strain.  QT interval is prolonged which may be due to  multiple causes.  Abnormal electrocardiogram.      Jasper Loser. Luan Pulling, M.D.  Electronically Signed     ELH/MEDQ  D:  03/11/2006  T:  03/12/2006  Job:  [id number]

## 2010-10-09 NOTE — Group Therapy Note (Signed)
Brian Barrera, Brian Barrera             ACCOUNT NO.:  [id number]   MEDICAL RECORD NO.:  [id number]          PATIENT TYPE:  INP   LOCATION:  A204                          FACILITY:  [facility]   PHYSICIAN:  Audria Nine, M.D.DATE OF BIRTH:  1971-01-17   DATE OF PROCEDURE:  03/13/2006  DATE OF DISCHARGE:                                   PROGRESS NOTE   OBJECTIVE:  The patient feels a little tired, but he is better.  He denies  any nausea or vomiting, no diarrhea.  He has no chest pain or shortness of  breath.  The patient has history of refractory severe hypertension with poor  compliance to medications.   OBJECTIVE:  GENERAL:  He is conscious, alert, comfortable, not in acute  distress.  VITAL SIGNS:  Blood pressure:  Is improved to between 120/65 to 152/91.  Oxygen saturation 97% on room air.  Pulse:  80.  Respirations:  18.  T-max  97.6.  HEENT:  Normocephalic, atraumatic.  Oral mucosa is moist with no exudates.  NECK:  Supple.  No JVD, no lymphadenopathy.  LUNGS:  Clear, good air entry bilaterally.  HEART:  S1, S2 regular.  No S3, S4, gallops or rubs.  ABDOMEN:  Soft, nontender.  Bowel sounds positive.  EXTREMITIES:  No edema.   LABORATORY/DIAGNOSTIC DATA:  BUN, CBC was normal and unchanged from  yesterday.   ASSESSMENT/PLAN:  1. Hypertensive encephalopathy.  This is resolved.  The patient's blood      pressure is better.  Will continue his current medications.  I think we      will observe him for 1 more day to insure that improvement in his blood      pressure is sustained.  He will likely be able to go home tomorrow.  2. Probable borderline diabetes.  His blood sugar on his b-mets today was      137 and this is a fasting blood sugar.  It was drawn at about 5 a.m.  I      have again informed him that he will need to be on a diet and close      followup with his primary care physician.   DISPOSITION:  Blood pressure is improved.  Will continue current  medications.  I expect  discharge in the a.m.      Audria Nine, M.D.  Electronically Signed     AM/MEDQ  D:  03/13/2006  T:  03/13/2006  Job:  [id number]

## 2010-10-09 NOTE — Discharge Summary (Signed)
NAMEMONSERRATE, FERRELLI NO.:  [id number]   MEDICAL RECORD NO.:  [id number]          PATIENT TYPE:  INP   LOCATION:  A204                          FACILITY:  [facility]   PHYSICIAN:  Bonnielee Haff, MD     DATE OF BIRTH:  Jan 21, 1971   DATE OF ADMISSION:  03/11/2006  DATE OF DISCHARGE:  10/22/2007LH                                 DISCHARGE SUMMARY   DISCHARGE SUMMARY:  Please review H&P dictated by Dr. Edythe Lynn for details  regarding patient's presenting illness.   DISCHARGE DIAGNOSES:  1. Hypertensive urgency with symptoms of nausea/vomiting, improved.  2. Chronic renal insufficiency, stable.  3. History of mild asthma, stable.   BRIEF HOSPITAL COURSE:  Briefly, this is a 40 year old African-American male  who has severe hypertension, on multiple medications, who presented with  symptoms of sudden-onset dizziness, nausea, vomiting.  He was found to have  elevated blood pressure when he showed up in the emergency department  measuring 206/136.  It was thought that the patient had either vertigo or  hypertensive  encephalopathy.  Patient underwent a CAT scan of the head,  which was unremarkable.  Chest x-ray showed mild cardiomegaly with no acute  abnormalities.  Patient was admitted to the hospital and underwent ruling  out protocol for acute coronary syndrome, which was also ruled out.  His  lipid profiles were not markedly abnormal.  His TSH was normal.  Homocystine  was normal.  HBA1c was 6.2, however.  Blood sugars, otherwise, were in the  normal range.  Patient was started on additional antihypertensive agents  apart from the ones that he was already taking.  He was initiated on  clonidine as well as Norvasc; with this his blood pressure started  improving, and on the day of discharge it was down in the acceptable range,  though not completely normal.   On the day of discharge the patient was feeling well, asymptomatic, no chest  pain, no shortness of breath.   His vital signs showed that he was afebrile,  heart rate was in the 70s, blood pressure 162/94, though it had been running  128/65, 140/71 prior to that.  Considering improvement in his blood  pressures as well as his symptoms, he was considered stable for discharge.  He was counseled extensively on the importance of being compliant with his  medication regimen.  He was also told that he needs to follow up with his  PMD on a regular basis to avoid similar complications in the future.   He was also noted to have elevated creatinine of 1.8.  This went down to  1.6, 1.5.  Patient is a slightly large individual, probably obese.  His BUN  was normal.  This could all be coming from his muscles, or patient could  also have chronic renal insufficiency.  We do not have imaging studies on  his kidneys to offer any more diagnoses.  He was asked to follow up with his  PMD for the same.   DISCHARGE MEDICATIONS:  1. Clonidine 0.2 mg p.o. b.i.d.  2. Norvasc 10 mg p.o. daily.  3.  He was otherwise asked to continue his home medications including      aspirin, Micardis, HCTZ, hydralazine, labetalol, and lisinopril. Please      see H&P for full list.   FOLLOWUP:  With his PMD in the next couple of weeks to make sure his blood  pressures are in the therapeutic range.   DIET:  He needs to have a heart-healthy diet.   PHYSICAL ACTIVITY:  No restrictions.   Total time of discharge about 35 minutes.      Bonnielee Haff, MD  Electronically Signed     GK/MEDQ  D:  03/15/2006  T:  03/15/2006  Job:  [id number]

## 2011-02-08 ENCOUNTER — Ambulatory Visit: Payer: Self-pay | Admitting: Vascular Surgery

## 2011-05-27 DIAGNOSIS — D509 Iron deficiency anemia, unspecified: Secondary | ICD-10-CM | POA: Diagnosis not present

## 2011-05-27 DIAGNOSIS — N039 Chronic nephritic syndrome with unspecified morphologic changes: Secondary | ICD-10-CM | POA: Diagnosis not present

## 2011-05-27 DIAGNOSIS — N2581 Secondary hyperparathyroidism of renal origin: Secondary | ICD-10-CM | POA: Diagnosis not present

## 2011-05-27 DIAGNOSIS — N186 End stage renal disease: Secondary | ICD-10-CM | POA: Diagnosis not present

## 2011-05-27 DIAGNOSIS — D631 Anemia in chronic kidney disease: Secondary | ICD-10-CM | POA: Diagnosis not present

## 2011-05-29 DIAGNOSIS — D509 Iron deficiency anemia, unspecified: Secondary | ICD-10-CM | POA: Diagnosis not present

## 2011-05-29 DIAGNOSIS — N186 End stage renal disease: Secondary | ICD-10-CM | POA: Diagnosis not present

## 2011-05-29 DIAGNOSIS — D631 Anemia in chronic kidney disease: Secondary | ICD-10-CM | POA: Diagnosis not present

## 2011-05-29 DIAGNOSIS — N2581 Secondary hyperparathyroidism of renal origin: Secondary | ICD-10-CM | POA: Diagnosis not present

## 2011-06-01 DIAGNOSIS — D631 Anemia in chronic kidney disease: Secondary | ICD-10-CM | POA: Diagnosis not present

## 2011-06-01 DIAGNOSIS — N186 End stage renal disease: Secondary | ICD-10-CM | POA: Diagnosis not present

## 2011-06-01 DIAGNOSIS — D509 Iron deficiency anemia, unspecified: Secondary | ICD-10-CM | POA: Diagnosis not present

## 2011-06-01 DIAGNOSIS — N2581 Secondary hyperparathyroidism of renal origin: Secondary | ICD-10-CM | POA: Diagnosis not present

## 2011-06-03 DIAGNOSIS — D509 Iron deficiency anemia, unspecified: Secondary | ICD-10-CM | POA: Diagnosis not present

## 2011-06-03 DIAGNOSIS — D631 Anemia in chronic kidney disease: Secondary | ICD-10-CM | POA: Diagnosis not present

## 2011-06-03 DIAGNOSIS — N2581 Secondary hyperparathyroidism of renal origin: Secondary | ICD-10-CM | POA: Diagnosis not present

## 2011-06-03 DIAGNOSIS — N186 End stage renal disease: Secondary | ICD-10-CM | POA: Diagnosis not present

## 2011-06-05 DIAGNOSIS — N186 End stage renal disease: Secondary | ICD-10-CM | POA: Diagnosis not present

## 2011-06-05 DIAGNOSIS — D631 Anemia in chronic kidney disease: Secondary | ICD-10-CM | POA: Diagnosis not present

## 2011-06-05 DIAGNOSIS — N039 Chronic nephritic syndrome with unspecified morphologic changes: Secondary | ICD-10-CM | POA: Diagnosis not present

## 2011-06-05 DIAGNOSIS — N2581 Secondary hyperparathyroidism of renal origin: Secondary | ICD-10-CM | POA: Diagnosis not present

## 2011-06-05 DIAGNOSIS — D509 Iron deficiency anemia, unspecified: Secondary | ICD-10-CM | POA: Diagnosis not present

## 2011-06-08 DIAGNOSIS — N2581 Secondary hyperparathyroidism of renal origin: Secondary | ICD-10-CM | POA: Diagnosis not present

## 2011-06-08 DIAGNOSIS — D631 Anemia in chronic kidney disease: Secondary | ICD-10-CM | POA: Diagnosis not present

## 2011-06-08 DIAGNOSIS — N186 End stage renal disease: Secondary | ICD-10-CM | POA: Diagnosis not present

## 2011-06-08 DIAGNOSIS — N039 Chronic nephritic syndrome with unspecified morphologic changes: Secondary | ICD-10-CM | POA: Diagnosis not present

## 2011-06-08 DIAGNOSIS — D509 Iron deficiency anemia, unspecified: Secondary | ICD-10-CM | POA: Diagnosis not present

## 2011-06-10 DIAGNOSIS — N2581 Secondary hyperparathyroidism of renal origin: Secondary | ICD-10-CM | POA: Diagnosis not present

## 2011-06-10 DIAGNOSIS — D509 Iron deficiency anemia, unspecified: Secondary | ICD-10-CM | POA: Diagnosis not present

## 2011-06-10 DIAGNOSIS — N186 End stage renal disease: Secondary | ICD-10-CM | POA: Diagnosis not present

## 2011-06-10 DIAGNOSIS — D631 Anemia in chronic kidney disease: Secondary | ICD-10-CM | POA: Diagnosis not present

## 2011-06-12 DIAGNOSIS — N186 End stage renal disease: Secondary | ICD-10-CM | POA: Diagnosis not present

## 2011-06-12 DIAGNOSIS — D509 Iron deficiency anemia, unspecified: Secondary | ICD-10-CM | POA: Diagnosis not present

## 2011-06-12 DIAGNOSIS — N2581 Secondary hyperparathyroidism of renal origin: Secondary | ICD-10-CM | POA: Diagnosis not present

## 2011-06-12 DIAGNOSIS — D631 Anemia in chronic kidney disease: Secondary | ICD-10-CM | POA: Diagnosis not present

## 2011-06-15 DIAGNOSIS — D631 Anemia in chronic kidney disease: Secondary | ICD-10-CM | POA: Diagnosis not present

## 2011-06-15 DIAGNOSIS — N186 End stage renal disease: Secondary | ICD-10-CM | POA: Diagnosis not present

## 2011-06-15 DIAGNOSIS — D509 Iron deficiency anemia, unspecified: Secondary | ICD-10-CM | POA: Diagnosis not present

## 2011-06-15 DIAGNOSIS — N2581 Secondary hyperparathyroidism of renal origin: Secondary | ICD-10-CM | POA: Diagnosis not present

## 2011-06-17 DIAGNOSIS — N2581 Secondary hyperparathyroidism of renal origin: Secondary | ICD-10-CM | POA: Diagnosis not present

## 2011-06-17 DIAGNOSIS — N186 End stage renal disease: Secondary | ICD-10-CM | POA: Diagnosis not present

## 2011-06-17 DIAGNOSIS — D631 Anemia in chronic kidney disease: Secondary | ICD-10-CM | POA: Diagnosis not present

## 2011-06-17 DIAGNOSIS — D509 Iron deficiency anemia, unspecified: Secondary | ICD-10-CM | POA: Diagnosis not present

## 2011-06-19 DIAGNOSIS — N186 End stage renal disease: Secondary | ICD-10-CM | POA: Diagnosis not present

## 2011-06-19 DIAGNOSIS — D631 Anemia in chronic kidney disease: Secondary | ICD-10-CM | POA: Diagnosis not present

## 2011-06-19 DIAGNOSIS — D509 Iron deficiency anemia, unspecified: Secondary | ICD-10-CM | POA: Diagnosis not present

## 2011-06-19 DIAGNOSIS — N2581 Secondary hyperparathyroidism of renal origin: Secondary | ICD-10-CM | POA: Diagnosis not present

## 2011-06-22 DIAGNOSIS — N2581 Secondary hyperparathyroidism of renal origin: Secondary | ICD-10-CM | POA: Diagnosis not present

## 2011-06-22 DIAGNOSIS — D631 Anemia in chronic kidney disease: Secondary | ICD-10-CM | POA: Diagnosis not present

## 2011-06-22 DIAGNOSIS — D509 Iron deficiency anemia, unspecified: Secondary | ICD-10-CM | POA: Diagnosis not present

## 2011-06-22 DIAGNOSIS — N186 End stage renal disease: Secondary | ICD-10-CM | POA: Diagnosis not present

## 2011-06-24 DIAGNOSIS — N2581 Secondary hyperparathyroidism of renal origin: Secondary | ICD-10-CM | POA: Diagnosis not present

## 2011-06-24 DIAGNOSIS — D631 Anemia in chronic kidney disease: Secondary | ICD-10-CM | POA: Diagnosis not present

## 2011-06-24 DIAGNOSIS — D509 Iron deficiency anemia, unspecified: Secondary | ICD-10-CM | POA: Diagnosis not present

## 2011-06-24 DIAGNOSIS — N186 End stage renal disease: Secondary | ICD-10-CM | POA: Diagnosis not present

## 2011-06-26 DIAGNOSIS — N186 End stage renal disease: Secondary | ICD-10-CM | POA: Diagnosis not present

## 2011-06-26 DIAGNOSIS — D509 Iron deficiency anemia, unspecified: Secondary | ICD-10-CM | POA: Diagnosis not present

## 2011-06-26 DIAGNOSIS — D631 Anemia in chronic kidney disease: Secondary | ICD-10-CM | POA: Diagnosis not present

## 2011-06-26 DIAGNOSIS — N2581 Secondary hyperparathyroidism of renal origin: Secondary | ICD-10-CM | POA: Diagnosis not present

## 2011-06-28 ENCOUNTER — Encounter: Payer: Self-pay | Admitting: Family Medicine

## 2011-06-28 ENCOUNTER — Ambulatory Visit (INDEPENDENT_AMBULATORY_CARE_PROVIDER_SITE_OTHER): Payer: Medicare Other | Admitting: Family Medicine

## 2011-06-28 VITALS — BP 120/80 | HR 93 | Resp 16 | Ht 72.25 in | Wt 293.4 lb

## 2011-06-28 DIAGNOSIS — N189 Chronic kidney disease, unspecified: Secondary | ICD-10-CM

## 2011-06-28 DIAGNOSIS — N186 End stage renal disease: Secondary | ICD-10-CM

## 2011-06-28 DIAGNOSIS — I5042 Chronic combined systolic (congestive) and diastolic (congestive) heart failure: Secondary | ICD-10-CM

## 2011-06-28 DIAGNOSIS — J449 Chronic obstructive pulmonary disease, unspecified: Secondary | ICD-10-CM | POA: Diagnosis not present

## 2011-06-28 DIAGNOSIS — D631 Anemia in chronic kidney disease: Secondary | ICD-10-CM

## 2011-06-28 DIAGNOSIS — M25569 Pain in unspecified knee: Secondary | ICD-10-CM

## 2011-06-28 DIAGNOSIS — J069 Acute upper respiratory infection, unspecified: Secondary | ICD-10-CM

## 2011-06-28 DIAGNOSIS — E669 Obesity, unspecified: Secondary | ICD-10-CM

## 2011-06-28 DIAGNOSIS — Z992 Dependence on renal dialysis: Secondary | ICD-10-CM

## 2011-06-28 DIAGNOSIS — I10 Essential (primary) hypertension: Secondary | ICD-10-CM

## 2011-06-28 DIAGNOSIS — M25519 Pain in unspecified shoulder: Secondary | ICD-10-CM

## 2011-06-28 MED ORDER — IPRATROPIUM-ALBUTEROL 18-103 MCG/ACT IN AERO: 2.0000 | INHALATION_SPRAY | Freq: Four times a day (QID) | RESPIRATORY_TRACT | Status: DC | PRN

## 2011-06-28 MED ORDER — GUAIFENESIN-CODEINE 100-10 MG/5ML PO SYRP: 5.0000 mL | ORAL_SOLUTION | Freq: Three times a day (TID) | ORAL | Status: DC | PRN

## 2011-06-28 MED ORDER — TRAMADOL HCL 50 MG PO TABS: 50.0000 mg | ORAL_TABLET | Freq: Four times a day (QID) | ORAL | Status: DC | PRN

## 2011-06-28 NOTE — Patient Instructions (Signed)
Call if the pain medication is not helping in 2 weeks Use your inhaler as needed four times a day Use the cough medicine as needed for your virus Continue your other medications I will review your records  F/u 6 weeks

## 2011-06-28 NOTE — Assessment & Plan Note (Signed)
Obtain records from patient nephrologist.

## 2011-06-28 NOTE — Assessment & Plan Note (Signed)
Currently compensated. Hospital records state that heart failure is mild.

## 2011-06-28 NOTE — Progress Notes (Signed)
  Subjective:    Patient ID: Brian Barrera, male    DOB: 05/29/1970, 41 y.o.   MRN: [id number]  HPI  Pt here to establish care, previous PCP  [facility]   Cough- cough productive of sputum for past 3-4days, no fever, no SOB, no sore throat, +flu shot   ESRD- July 2012, renal failure secondary to HTN, mother was on dialysis as well , T, Th, Sat, taking neprho vite, Renevela , Dr. 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 No specific injury to shoulder    Review of Systems   GEN- denies fatigue, fever, weight loss,weakness, recent illness HEENT- denies eye drainage, change in vision, nasal discharge, CVS- denies chest pain, palpitations RESP- denies SOB, +cough, denieswheeze ABD- denies N/V, change in stools, abd pain GU- denies dysuria, hematuria, dribbling, incontinence MSK- +joint pain, muscle aches, injury Neuro- denies headache, dizziness, syncope, seizure activity       Objective:   Physical Exam GEN- NAD, alert and oriented x3, obese HEENT- PERRL, EOMI, non injected sclera, pink conjunctiva, MMM, oropharynx clear Neck- Supple, no JVD, normal ROM CVS- RRR, no murmur RESP-CTAB, no rhonchi, no crackles ABD- NABS, soft, NT,ND EXT- No edema, shoulder- rotator cuff in tact bilat, biceps in tact bilat, strength equal bilat upper and lower ext.  Left knee- no effusion, no deformity, +crepitus, ligaments grossly in tact, mild TTP lateral aspect of knee Pulses- Radial, DP- 2+ AV graft-  Left UE        Assessment & Plan:

## 2011-06-28 NOTE — Assessment & Plan Note (Signed)
Blood pressure at goal on multiple agents.

## 2011-06-28 NOTE — Assessment & Plan Note (Signed)
No evidence of bronchitis or pneumonia. Patient with cough symptoms for the past 3 days. Will give Robitussin AC for cough. No antibiotics needed at this time

## 2011-06-28 NOTE — Assessment & Plan Note (Signed)
No evidence of acute exacerbation. Patient has COPD and asthma. Prescription given for Combivent. The new nebulizer machine was also sent in

## 2011-06-28 NOTE — Assessment & Plan Note (Signed)
No specific injury. Exam not concerning .He is  getting generalized aches and pains in the joints. He may have some early arthritis. Ultram per above her knee

## 2011-06-28 NOTE — Assessment & Plan Note (Signed)
Likely early arthritis. He is a very heavyset male also with injury in the past. No red flags on exam. Will give trial of Ultram. This does not work will use low-dose hydrocodone when necessary.

## 2011-06-28 NOTE — Assessment & Plan Note (Signed)
Obtain most recent set of labs. Patient followed by nephrology.

## 2011-07-05 ENCOUNTER — Other Ambulatory Visit: Payer: Self-pay | Admitting: Family Medicine

## 2011-07-05 MED ORDER — FLUTICASONE-SALMETEROL 250-50 MCG/DOSE IN AEPB: 1.0000 | INHALATION_SPRAY | Freq: Two times a day (BID) | RESPIRATORY_TRACT | Status: DC

## 2011-07-05 MED ORDER — ALBUTEROL 90 MCG/ACT IN AERS: 2.0000 | INHALATION_SPRAY | RESPIRATORY_TRACT | Status: DC | PRN

## 2011-07-05 NOTE — Progress Notes (Signed)
Pt aware.

## 2011-07-12 ENCOUNTER — Telehealth: Payer: Self-pay | Admitting: Family Medicine

## 2011-07-12 MED ORDER — HYDROCODONE-ACETAMINOPHEN 5-500 MG PO TABS: 1.0000 | ORAL_TABLET | Freq: Two times a day (BID) | ORAL | Status: DC | PRN

## 2011-07-12 NOTE — Telephone Encounter (Signed)
Start low dose vicodin

## 2011-07-12 NOTE — Telephone Encounter (Signed)
Noted and completed.

## 2011-07-22 DIAGNOSIS — N186 End stage renal disease: Secondary | ICD-10-CM | POA: Diagnosis not present

## 2011-07-24 DIAGNOSIS — N2581 Secondary hyperparathyroidism of renal origin: Secondary | ICD-10-CM | POA: Diagnosis not present

## 2011-07-24 DIAGNOSIS — D631 Anemia in chronic kidney disease: Secondary | ICD-10-CM | POA: Diagnosis not present

## 2011-07-24 DIAGNOSIS — D509 Iron deficiency anemia, unspecified: Secondary | ICD-10-CM | POA: Diagnosis not present

## 2011-07-24 DIAGNOSIS — N186 End stage renal disease: Secondary | ICD-10-CM | POA: Diagnosis not present

## 2011-08-06 ENCOUNTER — Ambulatory Visit: Payer: Self-pay | Admitting: Vascular Surgery

## 2011-08-06 DIAGNOSIS — Z79899 Other long term (current) drug therapy: Secondary | ICD-10-CM | POA: Diagnosis not present

## 2011-08-06 DIAGNOSIS — T82898A Other specified complication of vascular prosthetic devices, implants and grafts, initial encounter: Secondary | ICD-10-CM | POA: Diagnosis not present

## 2011-08-06 DIAGNOSIS — I120 Hypertensive chronic kidney disease with stage 5 chronic kidney disease or end stage renal disease: Secondary | ICD-10-CM | POA: Diagnosis not present

## 2011-08-06 DIAGNOSIS — E669 Obesity, unspecified: Secondary | ICD-10-CM | POA: Diagnosis not present

## 2011-08-06 DIAGNOSIS — F17200 Nicotine dependence, unspecified, uncomplicated: Secondary | ICD-10-CM | POA: Diagnosis not present

## 2011-08-06 DIAGNOSIS — N186 End stage renal disease: Secondary | ICD-10-CM | POA: Diagnosis not present

## 2011-08-06 DIAGNOSIS — Z7982 Long term (current) use of aspirin: Secondary | ICD-10-CM | POA: Diagnosis not present

## 2011-08-06 DIAGNOSIS — I10 Essential (primary) hypertension: Secondary | ICD-10-CM | POA: Diagnosis not present

## 2011-08-06 DIAGNOSIS — Z992 Dependence on renal dialysis: Secondary | ICD-10-CM | POA: Diagnosis not present

## 2011-08-06 LAB — POTASSIUM: Potassium: 4.2 mmol/L (ref 3.5–5.1)

## 2011-08-09 ENCOUNTER — Ambulatory Visit (INDEPENDENT_AMBULATORY_CARE_PROVIDER_SITE_OTHER): Payer: Medicare Other | Admitting: Family Medicine

## 2011-08-09 ENCOUNTER — Encounter: Payer: Self-pay | Admitting: Family Medicine

## 2011-08-09 VITALS — BP 130/92 | HR 75 | Resp 16 | Ht 72.25 in | Wt 284.0 lb

## 2011-08-09 DIAGNOSIS — F17200 Nicotine dependence, unspecified, uncomplicated: Secondary | ICD-10-CM

## 2011-08-09 DIAGNOSIS — J449 Chronic obstructive pulmonary disease, unspecified: Secondary | ICD-10-CM

## 2011-08-09 DIAGNOSIS — I10 Essential (primary) hypertension: Secondary | ICD-10-CM

## 2011-08-09 DIAGNOSIS — F329 Major depressive disorder, single episode, unspecified: Secondary | ICD-10-CM

## 2011-08-09 DIAGNOSIS — Z992 Dependence on renal dialysis: Secondary | ICD-10-CM

## 2011-08-09 DIAGNOSIS — G4700 Insomnia, unspecified: Secondary | ICD-10-CM | POA: Diagnosis not present

## 2011-08-09 DIAGNOSIS — N186 End stage renal disease: Secondary | ICD-10-CM | POA: Diagnosis not present

## 2011-08-09 DIAGNOSIS — F3289 Other specified depressive episodes: Secondary | ICD-10-CM

## 2011-08-09 DIAGNOSIS — Z720 Tobacco use: Secondary | ICD-10-CM

## 2011-08-09 DIAGNOSIS — R4589 Other symptoms and signs involving emotional state: Secondary | ICD-10-CM

## 2011-08-09 MED ORDER — ZOLPIDEM TARTRATE 10 MG PO TABS: 10.0000 mg | ORAL_TABLET | Freq: Every evening | ORAL | Status: DC | PRN

## 2011-08-09 NOTE — Progress Notes (Signed)
  Subjective:    Patient ID: Brian Barrera, male    DOB: 08/08/1970, 41 y.o.   MRN: [id number]  HPI Pt here to f/u medications and COPD  COPD- doing well no SOB HTN- no change in medication recently BP has been stable, no edema ESRD- his graft is now non functional and he has temporary access in his chest wall , they are planning for another graft vs fistula Knee pain- asking for refill on medications for pain Insomnia- unable to sleep       Review of Systems     GEN- denies fatigue, fever, weight loss,weakness, recent illness HEENT- denies eye drainage, change in vision, nasal discharge, CVS- denies chest pain, palpitations RESP- denies SOB, +cough, denieswheeze ABD- denies N/V, change in stools, abd pain GU- denies dysuria, hematuria, dribbling, incontinence MSK- +joint pain, muscle aches, injury Neuro- denies headache, dizziness, syncope, seizure activity     Objective:   Physical Exam GEN- NAD, alert and oriented x3, obese HEENT- PERRL, EOMI, non injected sclera, pink conjunctiva, MMM, oropharynx clear CVS- RRR, no murmur RESP-CTAB, no rhonchi, no crackles EXT- No edema,  Pulses- Radial, DP- 2+ Right chest wall- catheter D/C/I Psych- depressed affect, no anxious appearing      Assessment & Plan:

## 2011-08-09 NOTE — Patient Instructions (Addendum)
Continue your current medications Try the ambien for sleep  Call ahead for your pain medication refill  I advise you to quit smoking F/U 2 months

## 2011-08-11 DIAGNOSIS — R4589 Other symptoms and signs involving emotional state: Secondary | ICD-10-CM | POA: Insufficient documentation

## 2011-08-11 DIAGNOSIS — N186 End stage renal disease: Secondary | ICD-10-CM | POA: Diagnosis not present

## 2011-08-11 DIAGNOSIS — T82898A Other specified complication of vascular prosthetic devices, implants and grafts, initial encounter: Secondary | ICD-10-CM | POA: Diagnosis not present

## 2011-08-11 DIAGNOSIS — Z01810 Encounter for preprocedural cardiovascular examination: Secondary | ICD-10-CM | POA: Diagnosis not present

## 2011-08-11 DIAGNOSIS — G4700 Insomnia, unspecified: Secondary | ICD-10-CM | POA: Insufficient documentation

## 2011-08-11 DIAGNOSIS — F17200 Nicotine dependence, unspecified, uncomplicated: Secondary | ICD-10-CM | POA: Diagnosis not present

## 2011-08-11 DIAGNOSIS — N19 Unspecified kidney failure: Secondary | ICD-10-CM | POA: Diagnosis not present

## 2011-08-11 NOTE — Assessment & Plan Note (Signed)
Trial of ambien 

## 2011-08-11 NOTE — Assessment & Plan Note (Signed)
No change to meds on HD

## 2011-08-11 NOTE — Assessment & Plan Note (Signed)
Will continue current medications, breathing improved

## 2011-08-11 NOTE — Assessment & Plan Note (Signed)
He has a quite depressed affect, he is very young and in now on HD with difficulties with COPD, HTN and mild heart failure. He is concerned he is already experiencing trouble with his HD sites and he knows he needs HD in order to live. He declines any medications to help his mood, he stays to himself does not see family very much, encouraged him to get out and visit with friends and family. I will give him a trial of ambien for sleep

## 2011-08-11 NOTE — Assessment & Plan Note (Signed)
I reviewed his last set of labs, and scanned, he is quite upset about having to have his graft redone

## 2011-08-11 NOTE — Assessment & Plan Note (Signed)
counseled to quit.  

## 2011-08-13 ENCOUNTER — Ambulatory Visit: Payer: Self-pay | Admitting: Vascular Surgery

## 2011-08-13 DIAGNOSIS — I10 Essential (primary) hypertension: Secondary | ICD-10-CM | POA: Diagnosis not present

## 2011-08-13 DIAGNOSIS — Z01810 Encounter for preprocedural cardiovascular examination: Secondary | ICD-10-CM | POA: Diagnosis not present

## 2011-08-13 DIAGNOSIS — Z01812 Encounter for preprocedural laboratory examination: Secondary | ICD-10-CM | POA: Diagnosis not present

## 2011-08-13 DIAGNOSIS — N186 End stage renal disease: Secondary | ICD-10-CM | POA: Diagnosis not present

## 2011-08-13 LAB — CBC
HCT: 30.4 % — ABNORMAL LOW (ref 40.0–52.0)
HGB: 10.1 g/dL — ABNORMAL LOW (ref 13.0–18.0)
MCH: 31.8 pg (ref 26.0–34.0)
MCHC: 33.2 g/dL (ref 32.0–36.0)
MCV: 96 fL (ref 80–100)
Platelet: 237 10*3/uL (ref 150–440)
RBC: 3.17 10*6/uL — ABNORMAL LOW (ref 4.40–5.90)
RDW: 14.7 % — ABNORMAL HIGH (ref 11.5–14.5)
WBC: 7.1 10*3/uL (ref 3.8–10.6)

## 2011-08-13 LAB — BASIC METABOLIC PANEL
Anion Gap: 10 (ref 7–16)
BUN: 24 mg/dL — ABNORMAL HIGH (ref 7–18)
Calcium, Total: 9.6 mg/dL (ref 8.5–10.1)
Chloride: 104 mmol/L (ref 98–107)
Co2: 27 mmol/L (ref 21–32)
Creatinine: 7.85 mg/dL — ABNORMAL HIGH (ref 0.60–1.30)
EGFR (African American): 10 — ABNORMAL LOW
EGFR (Non-African Amer.): 8 — ABNORMAL LOW
Glucose: 90 mg/dL (ref 65–99)
Osmolality: 285 (ref 275–301)
Potassium: 4.1 mmol/L (ref 3.5–5.1)
Sodium: 141 mmol/L (ref 136–145)

## 2011-08-18 ENCOUNTER — Telehealth: Payer: Self-pay | Admitting: Family Medicine

## 2011-08-18 MED ORDER — TEMAZEPAM 15 MG PO CAPS: 15.0000 mg | ORAL_CAPSULE | Freq: Every evening | ORAL | Status: DC | PRN

## 2011-08-18 NOTE — Telephone Encounter (Signed)
Is there anything else he can try

## 2011-08-18 NOTE — Telephone Encounter (Signed)
Pt aware.

## 2011-08-18 NOTE — Telephone Encounter (Signed)
He can try restoril. Please fax to [employer]

## 2011-08-22 DIAGNOSIS — N186 End stage renal disease: Secondary | ICD-10-CM | POA: Diagnosis not present

## 2011-08-24 DIAGNOSIS — N186 End stage renal disease: Secondary | ICD-10-CM | POA: Diagnosis not present

## 2011-08-24 DIAGNOSIS — N2581 Secondary hyperparathyroidism of renal origin: Secondary | ICD-10-CM | POA: Diagnosis not present

## 2011-08-24 DIAGNOSIS — D509 Iron deficiency anemia, unspecified: Secondary | ICD-10-CM | POA: Diagnosis not present

## 2011-08-24 DIAGNOSIS — D631 Anemia in chronic kidney disease: Secondary | ICD-10-CM | POA: Diagnosis not present

## 2011-09-03 ENCOUNTER — Ambulatory Visit (INDEPENDENT_AMBULATORY_CARE_PROVIDER_SITE_OTHER): Payer: Medicare Other | Admitting: Cardiology

## 2011-09-03 ENCOUNTER — Encounter: Payer: Self-pay | Admitting: Cardiology

## 2011-09-03 ENCOUNTER — Telehealth: Payer: Self-pay | Admitting: Family Medicine

## 2011-09-03 DIAGNOSIS — I10 Essential (primary) hypertension: Secondary | ICD-10-CM

## 2011-09-03 DIAGNOSIS — N039 Chronic nephritic syndrome with unspecified morphologic changes: Secondary | ICD-10-CM | POA: Diagnosis not present

## 2011-09-03 DIAGNOSIS — Z992 Dependence on renal dialysis: Secondary | ICD-10-CM

## 2011-09-03 DIAGNOSIS — N189 Chronic kidney disease, unspecified: Secondary | ICD-10-CM | POA: Diagnosis not present

## 2011-09-03 DIAGNOSIS — N186 End stage renal disease: Secondary | ICD-10-CM

## 2011-09-03 DIAGNOSIS — Z720 Tobacco use: Secondary | ICD-10-CM

## 2011-09-03 DIAGNOSIS — I5042 Chronic combined systolic (congestive) and diastolic (congestive) heart failure: Secondary | ICD-10-CM

## 2011-09-03 DIAGNOSIS — I428 Other cardiomyopathies: Secondary | ICD-10-CM | POA: Diagnosis not present

## 2011-09-03 DIAGNOSIS — F17200 Nicotine dependence, unspecified, uncomplicated: Secondary | ICD-10-CM

## 2011-09-03 DIAGNOSIS — D631 Anemia in chronic kidney disease: Secondary | ICD-10-CM

## 2011-09-03 MED ORDER — HYDROCODONE-ACETAMINOPHEN 5-500 MG PO TABS: 1.0000 | ORAL_TABLET | Freq: Two times a day (BID) | ORAL | Status: DC | PRN

## 2011-09-03 NOTE — Assessment & Plan Note (Signed)
The importance of discontinuing tobacco use was discussed with Brian Barrera.  Unfortunately, he does not appear ready to stop smoking.

## 2011-09-03 NOTE — Telephone Encounter (Signed)
Pt aware.

## 2011-09-03 NOTE — Telephone Encounter (Signed)
When can he collect?

## 2011-09-03 NOTE — Assessment & Plan Note (Addendum)
Dialysis is proceeding via central line pending surgical creation of new vascular access.  The planned surgery Is associated with modest physiologic stress and low perioperative risk.  No preoperative testing nor change in medical therapy would likely significantly lower the magnitude of the small risk. Surgery can proceed as planned at the surgeon's and patient's earliest mutual convenience.

## 2011-09-03 NOTE — Telephone Encounter (Signed)
He can pick up today

## 2011-09-03 NOTE — Assessment & Plan Note (Addendum)
Anemia is minimal and does not contribute to perioperative risk for the relatively minor surgical procedure that is proposed.

## 2011-09-03 NOTE — Assessment & Plan Note (Addendum)
Patient has no signs nor symptoms at present to suggest active congestive heart failure.  BNP level was not dramatically increased one month ago.  Chest x-ray from one year ago showed vascular redistribution without frank pulmonary edema.  A preoperative study should be obtained and reviewed.  If pulmonary edema is noted, additional dialysis should be performed until the lungs are clear.  An echocardiogram will be performed to determine if previously diagnosed left ventricular systolic dysfunction was transient.

## 2011-09-03 NOTE — Patient Instructions (Signed)
Your physician recommends that you schedule a follow-up appointment in: 3 months  Your physician has requested that you have an echocardiogram. Echocardiography is a painless test that uses sound waves to create images of your heart. It provides your doctor with information about the size and shape of your heart and how well your heart's chambers and valves are working. This procedure takes approximately one hour. There are no restrictions for this procedure.   

## 2011-09-03 NOTE — Assessment & Plan Note (Signed)
Blood pressure control is good, but patient feels betrayed that he was told antihypertensive regimen would be reduced once he started dialysis, and it has not been.  I see no benefit from treatment with furosemide and would manage intravascular volume with dialysis.  If LV systolic function is not substantially reduced, hydralazine can likely be discontinued.

## 2011-09-03 NOTE — Progress Notes (Signed)
Name: Brian Barrera    DOB: March 20, 1971  Age: 41 y.o.  MR#: [id number]       PCP:  Vic Blackbird, MD, MD      Insurance: @PAYORNAME @   CC:    Chief Complaint  Patient presents with  . Per dr Hortencia Pilar @ [facility]    Clearance for dialysis catheter placement    VS BP 138/78  Pulse 89  Ht 6' (1.829 m)  Wt 286 lb (129.729 kg)  BMI 38.79 kg/m2  SpO2 97%  Weights Current Weight  09/03/11 286 lb (129.729 kg)  08/09/11 284 lb (128.822 kg)  06/28/11 293 lb 6.4 oz (133.085 kg)    Blood Pressure  BP Readings from Last 3 Encounters:  09/03/11 138/78  08/09/11 130/92  06/28/11 120/80     Admit date:  (Not on file) Last encounter with RMR:  Visit date not found   Allergy No Known Allergies  Current Outpatient Prescriptions  Medication Sig Dispense Refill  . albuterol (PROVENTIL,VENTOLIN) 90 MCG/ACT inhaler Inhale 2 puffs into the lungs every 4 (four) hours as needed for wheezing.  17 g  3  . albuterol-ipratropium (COMBIVENT) 18-103 MCG/ACT inhaler Inhale 2 puffs into the lungs 4 (four) times daily as needed for wheezing.  1 Inhaler  3  . amLODipine (NORVASC) 10 MG tablet Take 10 mg by mouth daily.      [name] aspirin 81 MG tablet Take 160 mg by mouth daily.      [name] b complex-vitamin c-folic acid (NEPHRO-VITE) 0.8 MG TABS Take 0.8 mg by mouth at bedtime.      . cloNIDine (CATAPRES) 0.3 MG tablet Take 0.3 mg by mouth 3 (three) times daily.      . Fluticasone-Salmeterol (ADVAIR DISKUS) 250-50 MCG/DOSE AEPB Inhale 1 puff into the lungs 2 (two) times daily. For COPD  1 each  6  . furosemide (LASIX) 40 MG tablet Take 80 mg by mouth 2 (two) times daily.      . hydrALAZINE (APRESOLINE) 50 MG tablet Take 50 mg by mouth 3 (three) times daily.      [name] HYDROcodone-acetaminophen (VICODIN) 5-500 MG per tablet Take 1 tablet by mouth 2 (two) times daily as needed for pain.  45 tablet  1  . labetalol (NORMODYNE) 200 MG tablet Take 200 mg by mouth 2 (two) times daily.      . sevelamer  (RENVELA) 800 MG tablet Take 800 mg by mouth 3 (three) times daily with meals.      . temazepam (RESTORIL) 15 MG capsule Take 1 capsule (15 mg total) by mouth at bedtime as needed for sleep.  30 capsule  0  . traMADol (ULTRAM) 50 MG tablet Take 1 tablet (50 mg total) by mouth every 6 (six) hours as needed for pain. Take 1-2 tablets by mouth twice a day as needed for pain  40 tablet  1  . zolpidem (AMBIEN) 10 MG tablet Take 1 tablet (10 mg total) by mouth at bedtime as needed for sleep.  30 tablet  1    Discontinued Meds:    Medications Discontinued During This Encounter  Medication Reason  . guaiFENesin-codeine (ROBITUSSIN AC) 100-10 MG/5ML syrup Completed Course    Patient Active Problem List  Diagnoses  . COPD (chronic obstructive pulmonary disease)/Asthma  . ESRD (end stage renal disease) on dialysis  . HTN (hypertension)  . Anemia of renal disease  . Shoulder pain  . Knee pain  . Obesity  . Heart failure, systolic and  diastolic, chronic  . Insomnia  . Depressed affect  . Tobacco user    LABS No visits with results within 3 Month(s) from this visit. Latest known visit with results is:  Admission on 07/25/2010, Discharged on 07/28/2010  No results displayed because visit has over 200 results.       Results for this Opt Visit:     Results for orders placed during the hospital encounter of 07/25/10  POCT CARDIAC MARKERS      Component Value Range   Myoglobin, poc >500 (*) 12 - 200 (ng/mL)   CKMB, poc 12.4 (*) 1.0 - 8.0 (ng/mL)   Troponin i, poc <0.05  0.00 - 0.09 (ng/mL)   Comment       Value:            TROPONIN VALUES IN THE RANGE     OF 0.00-0.09 ng/mL SHOW     NO INDICATION OF     MYOCARDIAL INJURY.                PERSISTENTLY INCREASED TROPONIN     VALUES IN THE RANGE OF 0.10-0.24     ng/mL CAN BE SEEN IN:           -UNSTABLE ANGINA           -CONGESTIVE HEART FAILURE           -MYOCARDITIS           -CHEST TRAUMA           -ARRYHTHMIAS           -LATE  PRESENTING MI           -COPD       CLINICAL FOLLOW-UP RECOMMENDED.                TROPONIN VALUES >=0.25 ng/mL     INDICATE POSSIBLE MYOCARDIAL     ISCHEMIA. SERIAL TESTING     RECOMMENDED.  DIFFERENTIAL      Component Value Range   Neutrophils Relative 81 (*) 43 - 77 (%)   Neutro Abs 8.7 (*) 1.7 - 7.7 (K/uL)   Lymphocytes Relative 7 (*) 12 - 46 (%)   Lymphs Abs 0.8  0.7 - 4.0 (K/uL)   Monocytes Relative 9  3 - 12 (%)   Monocytes Absolute 1.0  0.1 - 1.0 (K/uL)   Eosinophils Relative 3  0 - 5 (%)   Eosinophils Absolute 0.3  0.0 - 0.7 (K/uL)   Basophils Relative 0  0 - 1 (%)   Basophils Absolute 0.0  0.0 - 0.1 (K/uL)  CBC      Component Value Range   WBC 10.8 (*) 4.0 - 10.5 (K/uL)   RBC 4.29  4.22 - 5.81 (MIL/uL)   Hemoglobin 11.9 (*) 13.0 - 17.0 (g/dL)   HCT 37.8 (*) 39.0 - 52.0 (%)   MCV 88.1  78.0 - 100.0 (fL)   MCH 27.7  26.0 - 34.0 (pg)   MCHC 31.5  30.0 - 36.0 (g/dL)   RDW 15.7 (*) 11.5 - 15.5 (%)   Platelets 193  150 - 400 (K/uL)  BASIC METABOLIC PANEL      Component Value Range   Sodium 139  135 - 145 (mEq/L)   Potassium 4.3  3.5 - 5.1 (mEq/L)   Chloride 106  96 - 112 (mEq/L)   CO2 23  19 - 32 (mEq/L)   Glucose, Bld 117 (*) 70 - 99 (mg/dL)   BUN 50 (*) 6 - 23 (  mg/dL)   Creatinine, Ser 5.70 (*) 0.4 - 1.5 (mg/dL)   Calcium 9.0  8.4 - 10.5 (mg/dL)   GFR calc non Af Amer 11 (*) >60 (mL/min)   GFR calc Af Amer   (*) >60 (mL/min)   Value: 13            The eGFR has been calculated     using the MDRD equation.     This calculation has not been     validated in all clinical     situations.     eGFR's persistently     <60 mL/min signify     possible Chronic Kidney Disease.  BRAIN NATRIURETIC PEPTIDE      Component Value Range   Pro B Natriuretic peptide (BNP) 530.0 (*) 0.0 - 100.0 (pg/mL)  BLOOD GAS, ARTERIAL      Component Value Range   FIO2 0.21     O2 Content 21.0     Delivery systems ROOM AIR     pH, Arterial 7.336 (*) 7.350 - 7.450    pCO2 arterial 42.7   35.0 - 45.0 (mmHg)   pO2, Arterial 45.4 (*) 80.0 - 100.0 (mmHg)   Bicarbonate 22.2  20.0 - 24.0 (mEq/L)   TCO2 20.4  0 - 100 (mmol/L)   Acid-base deficit 2.7 (*) 0.0 - 2.0 (mmol/L)   O2 Saturation 77.0     Patient temperature 37.0     Collection site RIGHT RADIAL     Drawn by [phone]     Sample type ARTERIAL     Allens test (pass/fail) PASS  PASS   CK TOTAL AND CKMB      Component Value Range   Total CK 998 (*) 7 - 232 (U/L)   CK, MB   (*) 0.3 - 4.0 (ng/mL)   Value: 15.5 CRITICAL RESULT CALLED TO, READ BACK BY AND VERIFIED WITH: YOUNG J AT 1510 ON 07/25/10 BY VASSER A   Relative Index 1.6  0.0 - 2.5   TROPONIN I      Component Value Range   Troponin I    0.00 - 0.06 (ng/mL)   Value: 0.03            NO INDICATION OF     MYOCARDIAL INJURY.  PROTIME-INR      Component Value Range   Prothrombin Time 14.0  11.6 - 15.2 (seconds)   INR 1.06  0.00 - 1.49   APTT      Component Value Range   aPTT 34  24 - 37 (seconds)  CARDIAC PANEL(CRET KIN+CKTOT+MB+TROPI)      Component Value Range   Total CK 959 (*) 7 - 232 (U/L)   CK, MB   (*) 0.3 - 4.0 (ng/mL)   Value: 15.3 CRITICAL VALUE NOTED.  VALUE IS CONSISTENT WITH PREVIOUSLY REPORTED AND CALLED VALUE.   Troponin I    0.00 - 0.06 (ng/mL)   Value: 0.04            NO INDICATION OF     MYOCARDIAL INJURY.   Relative Index 1.6  0.0 - 2.5   MRSA PCR SCREENING      Component Value Range   MRSA by PCR    NEGATIVE    Value: NEGATIVE            The GeneXpert MRSA Assay (FDA     approved for NASAL specimens     only), is one component of a     comprehensive MRSA colonization  surveillance program. It is not     intended to diagnose MRSA     infection nor to guide or     monitor treatment for     MRSA infections.  GLUCOSE, CAPILLARY      Component Value Range   Glucose-Capillary 163 (*) 70 - 99 (mg/dL)  CARDIAC PANEL(CRET KIN+CKTOT+MB+TROPI)      Component Value Range   Total CK 854 (*) 7 - 232 (U/L)   CK, MB   (*) 0.3 - 4.0 (ng/mL)    Value: 12.7 CRITICAL VALUE NOTED.  VALUE IS CONSISTENT WITH PREVIOUSLY REPORTED AND CALLED VALUE.   Troponin I    0.00 - 0.06 (ng/mL)   Value: 0.04            NO INDICATION OF     MYOCARDIAL INJURY.   Relative Index 1.5  0.0 - 2.5   CBC      Component Value Range   WBC 8.4  4.0 - 10.5 (K/uL)   RBC 4.03 (*) 4.22 - 5.81 (MIL/uL)   Hemoglobin 11.1 (*) 13.0 - 17.0 (g/dL)   HCT 36.0 (*) 39.0 - 52.0 (%)   MCV 89.3  78.0 - 100.0 (fL)   MCH 27.5  26.0 - 34.0 (pg)   MCHC 30.8  30.0 - 36.0 (g/dL)   RDW 15.7 (*) 11.5 - 15.5 (%)   Platelets 184  150 - 400 (K/uL)  RENAL FUNCTION PANEL      Component Value Range   Sodium 142  135 - 145 (mEq/L)   Potassium 5.0  3.5 - 5.1 (mEq/L)   Chloride 108  96 - 112 (mEq/L)   CO2 23  19 - 32 (mEq/L)   Glucose, Bld 147 (*) 70 - 99 (mg/dL)   BUN 58 (*) 6 - 23 (mg/dL)   Creatinine, Ser 6.03 (*) 0.4 - 1.5 (mg/dL)   Calcium 9.0  8.4 - 10.5 (mg/dL)   Phosphorus 4.2  2.3 - 4.6 (mg/dL)   Albumin 3.7  3.5 - 5.2 (g/dL)   GFR calc non Af Amer 10 (*) >60 (mL/min)   GFR calc Af Amer   (*) >60 (mL/min)   Value: 13            The eGFR has been calculated     using the MDRD equation.     This calculation has not been     validated in all clinical     situations.     eGFR's persistently     <60 mL/min signify     possible Chronic Kidney Disease.  GLUCOSE, CAPILLARY      Component Value Range   Glucose-Capillary 169 (*) 70 - 99 (mg/dL)   Comment 1 Notify RN     Comment 2 Documented in Chart    BRAIN NATRIURETIC PEPTIDE      Component Value Range   Pro B Natriuretic peptide (BNP) 515.0 (*) 0.0 - 100.0 (pg/mL)  CARDIAC PANEL(CRET KIN+CKTOT+MB+TROPI)      Component Value Range   Total CK 731 (*) 7 - 232 (U/L)   CK, MB   (*) 0.3 - 4.0 (ng/mL)   Value: 12.6 CRITICAL VALUE NOTED.  VALUE IS CONSISTENT WITH PREVIOUSLY REPORTED AND CALLED VALUE.   Troponin I    0.00 - 0.06 (ng/mL)   Value: 0.03            NO INDICATION OF     MYOCARDIAL INJURY.   Relative Index  1.7  0.0 - 2.5   GLUCOSE, CAPILLARY  Component Value Range   Glucose-Capillary 174 (*) 70 - 99 (mg/dL)   Comment 1 Documented in Chart     Comment 2 Notify RN    DRUG SCREEN PANEL, EMERGENCY      Component Value Range   Opiates NONE DETECTED  NONE DETECTED    Cocaine NONE DETECTED  NONE DETECTED    Benzodiazepines NONE DETECTED  NONE DETECTED    Amphetamines NONE DETECTED  NONE DETECTED    Tetrahydrocannabinol NONE DETECTED  NONE DETECTED    Barbiturates    NONE DETECTED    Value: NONE DETECTED            DRUG SCREEN FOR MEDICAL PURPOSES     ONLY.  IF CONFIRMATION IS NEEDED     FOR ANY PURPOSE, NOTIFY LAB     WITHIN 5 DAYS.                LOWEST DETECTABLE LIMITS     FOR URINE DRUG SCREEN     Drug Class       Cutoff (ng/mL)     Amphetamine      1000     Barbiturate      200     Benzodiazepine   [id number]     Tricyclics       [id number]     Opiates          300     Cocaine          300     THC              50  URINALYSIS, ROUTINE W REFLEX MICROSCOPIC      Component Value Range   Color, Urine YELLOW  YELLOW    APPearance CLEAR  CLEAR    Specific Gravity, Urine 1.025  1.005 - 1.030    pH 5.0  5.0 - 8.0    Glucose, UA NEGATIVE  NEGATIVE (mg/dL)   Hgb urine dipstick TRACE (*) NEGATIVE    Bilirubin Urine NEGATIVE  NEGATIVE    Ketones, ur NEGATIVE  NEGATIVE (mg/dL)   Protein, ur 100 (*) NEGATIVE (mg/dL)   Urobilinogen, UA 0.2  0.0 - 1.0 (mg/dL)   Nitrite NEGATIVE  NEGATIVE    Leukocytes, UA NEGATIVE  NEGATIVE   URINE MICROSCOPIC-ADD ON      Component Value Range   WBC, UA 3-6  <3 (WBC/hpf)   RBC / HPF 3-6  <3 (RBC/hpf)   Bacteria, UA RARE  RARE    Casts GRANULAR CAST (*) NEGATIVE   GLUCOSE, CAPILLARY      Component Value Range   Glucose-Capillary 136 (*) 70 - 99 (mg/dL)   Comment 1 Notify RN     Comment 2 Documented in Chart    GLUCOSE, CAPILLARY      Component Value Range   Glucose-Capillary 140 (*) 70 - 99 (mg/dL)  CULTURE, RESPIRATORY      Component Value Range    Specimen Description SPUTUM     Special Requests NONE     Gram Stain       Value: RARE WBC PRESENT, PREDOMINANTLY PMN     FEW SQUAMOUS EPITHELIAL CELLS PRESENT     MODERATE GRAM NEGATIVE RODS     FEW GRAM POSITIVE RODS     FEW GRAM POSITIVE COCCI IN PAIRS     IN CHAINS   Culture NORMAL OROPHARYNGEAL FLORA     Report Status 07/28/2010 FINAL    CBC      Component Value Range  WBC 11.2 (*) 4.0 - 10.5 (K/uL)   RBC 4.05 (*) 4.22 - 5.81 (MIL/uL)   Hemoglobin 11.3 (*) 13.0 - 17.0 (g/dL)   HCT 36.3 (*) 39.0 - 52.0 (%)   MCV 89.6  78.0 - 100.0 (fL)   MCH 27.9  26.0 - 34.0 (pg)   MCHC 31.1  30.0 - 36.0 (g/dL)   RDW 15.7 (*) 11.5 - 15.5 (%)   Platelets 185  150 - 400 (K/uL)  RENAL FUNCTION PANEL      Component Value Range   Sodium 138  135 - 145 (mEq/L)   Potassium 4.9  3.5 - 5.1 (mEq/L)   Chloride 104  96 - 112 (mEq/L)   CO2 22  19 - 32 (mEq/L)   Glucose, Bld 179 (*) 70 - 99 (mg/dL)   BUN 75 (*) 6 - 23 (mg/dL)   Creatinine, Ser 6.46 (*) 0.4 - 1.5 (mg/dL)   Calcium 8.6  8.4 - 10.5 (mg/dL)   Phosphorus 3.9  2.3 - 4.6 (mg/dL)   Albumin 3.6  3.5 - 5.2 (g/dL)   GFR calc non Af Amer 10 (*) >60 (mL/min)   GFR calc Af Amer   (*) >60 (mL/min)   Value: 12            The eGFR has been calculated     using the MDRD equation.     This calculation has not been     validated in all clinical     situations.     eGFR's persistently     <60 mL/min signify     possible Chronic Kidney Disease.  GLUCOSE, CAPILLARY      Component Value Range   Glucose-Capillary 136 (*) 70 - 99 (mg/dL)  DIFFERENTIAL      Component Value Range   Neutrophils Relative 92 (*) 43 - 77 (%)   Neutro Abs 9.2 (*) 1.7 - 7.7 (K/uL)   Lymphocytes Relative 6 (*) 12 - 46 (%)   Lymphs Abs 0.6 (*) 0.7 - 4.0 (K/uL)   Monocytes Relative 3  3 - 12 (%)   Monocytes Absolute 0.3  0.1 - 1.0 (K/uL)   Eosinophils Relative 0  0 - 5 (%)   Eosinophils Absolute 0.0  0.0 - 0.7 (K/uL)   Basophils Relative 0  0 - 1 (%)   Basophils  Absolute 0.0  0.0 - 0.1 (K/uL)  CBC      Component Value Range   WBC 10.1  4.0 - 10.5 (K/uL)   RBC 4.10 (*) 4.22 - 5.81 (MIL/uL)   Hemoglobin 11.3 (*) 13.0 - 17.0 (g/dL)   HCT 36.6 (*) 39.0 - 52.0 (%)   MCV 89.3  78.0 - 100.0 (fL)   MCH 27.6  26.0 - 34.0 (pg)   MCHC 30.9  30.0 - 36.0 (g/dL)   RDW 15.8 (*) 11.5 - 15.5 (%)   Platelets 181  150 - 400 (K/uL)  RENAL FUNCTION PANEL      Component Value Range   Sodium 137  135 - 145 (mEq/L)   Potassium 4.6  3.5 - 5.1 (mEq/L)   Chloride 104  96 - 112 (mEq/L)   CO2 20  19 - 32 (mEq/L)   Glucose, Bld 213 (*) 70 - 99 (mg/dL)   BUN 80 (*) 6 - 23 (mg/dL)   Creatinine, Ser 6.67 (*) 0.4 - 1.5 (mg/dL)   Calcium 8.5  8.4 - 10.5 (mg/dL)   Phosphorus 5.0 (*) 2.3 - 4.6 (mg/dL)   Albumin 3.7  3.5 -  5.2 (g/dL)   GFR calc non Af Amer 9 (*) >60 (mL/min)   GFR calc Af Amer   (*) >60 (mL/min)   Value: 11            The eGFR has been calculated     using the MDRD equation.     This calculation has not been     validated in all clinical     situations.     eGFR's persistently     <60 mL/min signify     possible Chronic Kidney Disease.  GLUCOSE, CAPILLARY      Component Value Range   Glucose-Capillary 189 (*) 70 - 99 (mg/dL)  GLUCOSE, CAPILLARY      Component Value Range   Glucose-Capillary 174 (*) 70 - 99 (mg/dL)  HEPATITIS B SURFACE ANTIGEN      Component Value Range   Hepatitis B Surface Ag NEGATIVE  NEGATIVE   HEPATITIS C ANTIBODY      Component Value Range   HCV Ab NEGATIVE  NEGATIVE   GLUCOSE, CAPILLARY      Component Value Range   Glucose-Capillary 162 (*) 70 - 99 (mg/dL)   Comment 1 Notify RN     Comment 2 Documented in Chart    BASIC METABOLIC PANEL      Component Value Range   Sodium 133 (*) 135 - 145 (mEq/L)   Potassium 4.7  3.5 - 5.1 (mEq/L)   Chloride 102  96 - 112 (mEq/L)   CO2 21  19 - 32 (mEq/L)   Glucose, Bld 169 (*) 70 - 99 (mg/dL)   BUN 90 (*) 6 - 23 (mg/dL)   Creatinine, Ser 6.82 (*) 0.4 - 1.5 (mg/dL)   Calcium 8.2  (*) 8.4 - 10.5 (mg/dL)   GFR calc non Af Amer 9 (*) >60 (mL/min)   GFR calc Af Amer   (*) >60 (mL/min)   Value: 11            The eGFR has been calculated     using the MDRD equation.     This calculation has not been     validated in all clinical     situations.     eGFR's persistently     <60 mL/min signify     possible Chronic Kidney Disease.  CBC      Component Value Range   WBC 9.5  4.0 - 10.5 (K/uL)   RBC 4.06 (*) 4.22 - 5.81 (MIL/uL)   Hemoglobin 11.3 (*) 13.0 - 17.0 (g/dL)   HCT 36.2 (*) 39.0 - 52.0 (%)   MCV 89.2  78.0 - 100.0 (fL)   MCH 27.8  26.0 - 34.0 (pg)   MCHC 31.2  30.0 - 36.0 (g/dL)   RDW 15.8 (*) 11.5 - 15.5 (%)   Platelets 200  150 - 400 (K/uL)  HEPATITIS B CORE ANTIBODY, IGM      Component Value Range   Hep B C IgM    NEGATIVE    Value: NEGATIVE     (NOTE) High levels of Hepatitis B Core IgM antibody are detectable during the acute stage of Hepatitis B. This antibody is used to differentiate current from past HBV infection.   GLUCOSE, CAPILLARY      Component Value Range   Glucose-Capillary 153 (*) 70 - 99 (mg/dL)  GLUCOSE, CAPILLARY      Component Value Range   Glucose-Capillary 175 (*) 70 - 99 (mg/dL)    EKG Orders placed in visit on 03/11/06  .  EKG     Prior Assessment and Plan Problem List as of 09/03/2011          Cardiology Problems   HTN (hypertension)   Last Assessment & Plan Note   08/09/2011 Office Visit Signed 08/11/2011  6:04 PM by [name], MD    No change to meds on HD    Heart failure, systolic and diastolic, chronic   Last Assessment & Plan Note   06/28/2011 Office Visit Signed 06/28/2011  4:30 PM by [name], MD    Currently compensated. Hospital records state that heart failure is mild.      Other   COPD (chronic obstructive pulmonary disease)/Asthma   Last Assessment & Plan Note   08/09/2011 Office Visit Signed 08/11/2011  6:03 PM by [name], MD    Will continue current medications, breathing  improved    ESRD (end stage renal disease) on dialysis   Last Assessment & Plan Note   08/09/2011 Office Visit Signed 08/11/2011  6:04 PM by [name], MD    I reviewed his last set of labs, and scanned, he is quite upset about having to have his graft redone    Anemia of renal disease   Last Assessment & Plan Note   06/28/2011 Office Visit Signed 06/28/2011  4:29 PM by [name], MD    Obtain most recent set of labs. Patient followed by nephrology.    Shoulder pain   Last Assessment & Plan Note   06/28/2011 Office Visit Signed 06/28/2011  4:31 PM by [name], MD    No specific injury. Exam not concerning .He is  getting generalized aches and pains in the joints. He may have some early arthritis. Ultram per above her knee    Knee pain   Last Assessment & Plan Note   06/28/2011 Office Visit Signed 06/28/2011  4:31 PM by [name], MD    Likely early arthritis. He is a very heavyset male also with injury in the past. No red flags on exam. Will give trial of Ultram. This does not work will use low-dose hydrocodone when necessary.    Obesity   Insomnia   Last Assessment & Plan Note   08/09/2011 Office Visit Signed 08/11/2011  6:07 PM by [name], MD    Trial of ambien    Depressed affect   Last Assessment & Plan Note   08/09/2011 Office Visit Signed 08/11/2011  6:07 PM by [name], MD    He has a quite depressed affect, he is very young and in now on HD with difficulties with COPD, HTN and mild heart failure. He is concerned he is already experiencing trouble with his HD sites and he knows he needs HD in order to live. He declines any medications to help his mood, he stays to himself does not see family very much, encouraged him to get out and visit with friends and family. I will give him a trial of ambien for sleep    Tobacco user   Last Assessment & Plan Note   08/09/2011 Office Visit Signed 08/11/2011  6:08 PM by [name], MD    counseled to  quit        Imaging: No results found.   FRS Calculation: Score not calculated. Missing: Total Cholesterol, HDL

## 2011-09-03 NOTE — Progress Notes (Signed)
Patient ID: Brian Barrera, male   DOB: [birth year], 41 y.o.   MRN: [id number]  HPI: Brian Barrera gentleman with a history of severe hypertension, hypertensive heart disease and end-stage renal disease referred by Dr. Leotis Pain for preoperative consultation prior to planned vascular access graft surgery.  [name] has had severe hypertension for as long as he can remember and was noncompliant with medications when he was younger resulting in long-standing poorly controlled hypertension.   He was previously followed by a cardiologist in [place], but does not believe that he has a history of significant cardiac problems.  He was admitted to [facility] in 07/2009 with edema and generalized weakness.  Advanced chronic kidney disease was present prompting arrangements for his 1st vascular surgery for fistula creation.  An echocardiogram at that time revealed severe LVH with global hypokinesis and an ejection fraction of approximately 35%.  Since dialysis was initiated approximately 9 months ago, patient has done fairly well.  He has had no signs nor symptoms of congestive heart failure.  The graft in his left arm recently occluded prompting plans for new access in the right arm.  Preoperative EKG showed marked ST-T wave abnormalities prompting preoperative consultation.  Patient is somewhat perturbed about the requirement for preoperative consultation.  He notes that his original graft was done without any cardiology involvement.  I explained the nature of his known cardiac problem and the importance of continuing cardiology care.  He has not experienced chest discomfort, dyspnea, orthopnea nor PND.  He has not been employed for 5 years, and activity is limited.  Blood pressure control has been good since he became compliant with his medical regime.  He continues to urinate and is maintained on furosemide despite thrice weekly dialysis sessions.  Current Outpatient Prescriptions on File Prior to  Visit  Medication Sig Dispense Refill  . albuterol (PROVENTIL,VENTOLIN) 90 MCG/ACT inhaler Inhale 2 puffs into the lungs every 4 (four) hours as needed for wheezing.  17 g  3  . albuterol-ipratropium (COMBIVENT) 18-103 MCG/ACT inhaler Inhale 2 puffs into the lungs 4 (four) times daily as needed for wheezing.  1 Inhaler  3  . amLODipine (NORVASC) 10 MG tablet Take 10 mg by mouth daily.      [name] aspirin 81 MG tablet Take 160 mg by mouth daily.      [name] b complex-vitamin c-folic acid (NEPHRO-VITE) 0.8 MG TABS Take 0.8 mg by mouth at bedtime.      . cloNIDine (CATAPRES) 0.3 MG tablet Take 0.3 mg by mouth 3 (three) times daily.      . Fluticasone-Salmeterol (ADVAIR DISKUS) 250-50 MCG/DOSE AEPB Inhale 1 puff into the lungs 2 (two) times daily. For COPD  1 each  6  . furosemide (LASIX) 40 MG tablet Take 80 mg by mouth 2 (two) times daily.      . hydrALAZINE (APRESOLINE) 50 MG tablet Take 50 mg by mouth 3 (three) times daily.      [name] labetalol (NORMODYNE) 200 MG tablet Take 200 mg by mouth 2 (two) times daily.      . sevelamer (RENVELA) 800 MG tablet Take 800 mg by mouth 3 (three) times daily with meals.      . temazepam (RESTORIL) 15 MG capsule Take 1 capsule (15 mg total) by mouth at bedtime as needed for sleep.  30 capsule  0  . traMADol (ULTRAM) 50 MG tablet Take 1 tablet (50 mg total) by mouth every 6 (six) hours as needed for pain.  Take 1-2 tablets by mouth twice a day as needed for pain  40 tablet  1  . zolpidem (AMBIEN) 10 MG tablet Take 1 tablet (10 mg total) by mouth at bedtime as needed for sleep.  30 tablet  1    No Known Allergies    Past Medical History  Diagnosis Date  . Hypertension   . Dialysis care December 01, 2010  . COPD (chronic obstructive pulmonary disease)   . Asthma   . Anemia   . CHF (congestive heart failure) 2011    EF 35%  . ESRD (end stage renal disease) on dialysis      Past Surgical History  Procedure Date  . Arteriovenous graft placement 2011     Family History    Problem Relation Age of Onset  . Heart disease Father   . Diabetes Sister   . Hypertension Sister   . Diabetes Sister   . Hypertension Sister   . Kidney disease Mother   . Hypertension Mother   . Hypertension Sister   . Hypertension Brother   . Hypertension Brother   . Hypertension Brother      History   Social History  . Marital Status: Single    Spouse Name: N/A    Number of Children: N/A  . Years of Education: N/A   Occupational History  . Not on file.   Social History Main Topics  . Smoking status: Current Everyday Smoker -- 0.5 packs/day  . Smokeless tobacco: Not on file  . Alcohol Use: No     quit etoh  . Drug Use: No  . Sexually Active: Not on file   Other Topics Concern  . Not on file   Social History Narrative  . No narrative on file     ROS:      All other systems reviewed and are negative.  PHYSICAL EXAM: BP 138/78  Pulse 89  Ht 6' (1.829 m)  Wt 129.729 kg (286 lb)  BMI 38.79 kg/m2  SpO2 97%  General-Well-developed; no acute distress Body Habitus-Moderately overweight HEENT-[place]/AT; PERRL; EOM intact; conjunctiva and lids nl Neck-No JVD; no carotid bruits Endocrine-No thyromegaly Lungs-Expiratory wheeze with markedly prolonged expiratory phase; resonant percussion; tunneled catheter over the right chest Cardiovascular- normal PMI; normal S1 and S2; S4 present; modest systolic ejection murmur Abdomen-BS normal; soft and non-tender without masses or organomegaly Musculoskeletal-No deformities, cyanosis or clubbing Neurologic-Nl cranial nerves; symmetric strength and tone Skin- Warm, no significant lesions Extremities-Nl distal pulses; no edema; graft in the left upper extremity is occluded  EKG:  Tracing performed 08/14/11 obtained and reviewed: Normal sinus rhythm, borderline left atrial abnormality, ST-T wave abnormalities consistent with inferolateral ischemia or LVH.   ASSESSMENT AND PLAN:  Jacqulyn Ducking, MD 09/03/2011 3:52 PM

## 2011-09-06 ENCOUNTER — Encounter: Payer: Self-pay | Admitting: Cardiology

## 2011-09-08 ENCOUNTER — Ambulatory Visit (HOSPITAL_COMMUNITY)
Admission: RE | Admit: 2011-09-08 | Discharge: 2011-09-08 | Disposition: A | Discharge status: Home / Self Care | Payer: Medicare Other | Source: Ambulatory Visit | Attending: Cardiology | Admitting: Cardiology

## 2011-09-08 DIAGNOSIS — I517 Cardiomegaly: Secondary | ICD-10-CM

## 2011-09-08 DIAGNOSIS — I428 Other cardiomyopathies: Secondary | ICD-10-CM | POA: Diagnosis not present

## 2011-09-08 NOTE — Progress Notes (Signed)
*  PRELIMINARY RESULTS* Echocardiogram 2D Echocardiogram has been performed.  Tera Partridge 09/08/2011, 9:11 AM

## 2011-09-09 ENCOUNTER — Encounter: Payer: Self-pay | Admitting: *Deleted

## 2011-09-15 ENCOUNTER — Ambulatory Visit: Payer: Self-pay | Admitting: Vascular Surgery

## 2011-09-15 DIAGNOSIS — F17200 Nicotine dependence, unspecified, uncomplicated: Secondary | ICD-10-CM | POA: Diagnosis not present

## 2011-09-15 DIAGNOSIS — J449 Chronic obstructive pulmonary disease, unspecified: Secondary | ICD-10-CM | POA: Diagnosis not present

## 2011-09-15 DIAGNOSIS — E669 Obesity, unspecified: Secondary | ICD-10-CM | POA: Diagnosis not present

## 2011-09-15 DIAGNOSIS — Z992 Dependence on renal dialysis: Secondary | ICD-10-CM | POA: Diagnosis not present

## 2011-09-15 DIAGNOSIS — Z7982 Long term (current) use of aspirin: Secondary | ICD-10-CM | POA: Diagnosis not present

## 2011-09-15 DIAGNOSIS — I120 Hypertensive chronic kidney disease with stage 5 chronic kidney disease or end stage renal disease: Secondary | ICD-10-CM | POA: Diagnosis not present

## 2011-09-15 DIAGNOSIS — N186 End stage renal disease: Secondary | ICD-10-CM | POA: Diagnosis not present

## 2011-09-15 DIAGNOSIS — Z79899 Other long term (current) drug therapy: Secondary | ICD-10-CM | POA: Diagnosis not present

## 2011-09-15 LAB — POTASSIUM: Potassium: 4.4 mmol/L (ref 3.5–5.1)

## 2011-09-21 DIAGNOSIS — N186 End stage renal disease: Secondary | ICD-10-CM | POA: Diagnosis not present

## 2011-09-23 DIAGNOSIS — D631 Anemia in chronic kidney disease: Secondary | ICD-10-CM | POA: Diagnosis not present

## 2011-09-23 DIAGNOSIS — N2581 Secondary hyperparathyroidism of renal origin: Secondary | ICD-10-CM | POA: Diagnosis not present

## 2011-09-23 DIAGNOSIS — N186 End stage renal disease: Secondary | ICD-10-CM | POA: Diagnosis not present

## 2011-09-23 DIAGNOSIS — D509 Iron deficiency anemia, unspecified: Secondary | ICD-10-CM | POA: Diagnosis not present

## 2011-10-05 ENCOUNTER — Telehealth: Payer: Self-pay | Admitting: Family Medicine

## 2011-10-06 DIAGNOSIS — N186 End stage renal disease: Secondary | ICD-10-CM | POA: Diagnosis not present

## 2011-10-06 DIAGNOSIS — I10 Essential (primary) hypertension: Secondary | ICD-10-CM | POA: Diagnosis not present

## 2011-10-06 DIAGNOSIS — T82898A Other specified complication of vascular prosthetic devices, implants and grafts, initial encounter: Secondary | ICD-10-CM | POA: Diagnosis not present

## 2011-10-06 MED ORDER — HYDROCODONE-ACETAMINOPHEN 5-500 MG PO TABS: 1.0000 | ORAL_TABLET | Freq: Two times a day (BID) | ORAL | Status: DC | PRN

## 2011-10-06 NOTE — Telephone Encounter (Signed)
Refill done, Pt needs pain contact if not signed

## 2011-10-06 NOTE — Telephone Encounter (Signed)
Is it ok to refill Vicodin?

## 2011-10-06 NOTE — Telephone Encounter (Signed)
Called pt.  No answering machine available.

## 2011-10-07 ENCOUNTER — Telehealth: Payer: Self-pay

## 2011-10-07 NOTE — Telephone Encounter (Signed)
Pt in to pick up rx 

## 2011-10-07 NOTE — Telephone Encounter (Signed)
I spoke with pharmacy- Pt has been having medication filled by myself as well as [facility], he recently tried to get my hydrocodone prescription filled when he was given percocet #50 tablets yesterday.  I have left a message with [facility] and will discuss with prescriber and patient

## 2011-10-08 NOTE — Telephone Encounter (Signed)
He has an appt on Monday will discuss with him then, no answer again

## 2011-10-08 NOTE — Telephone Encounter (Signed)
I spoke with Brian Barrera at [facility] he was prescribing narcotics for his arm pain but was not aware he was also getting medication from our office. He will no longer prescribe narcotics.I will call and talk to Brian Barrera about this, he did not answer his phone this morning. Unable to leave voice message

## 2011-10-11 ENCOUNTER — Encounter: Payer: Self-pay | Admitting: Family Medicine

## 2011-10-11 ENCOUNTER — Ambulatory Visit (INDEPENDENT_AMBULATORY_CARE_PROVIDER_SITE_OTHER): Payer: Medicare Other | Admitting: Family Medicine

## 2011-10-11 VITALS — BP 142/84 | HR 85 | Resp 18 | Ht 72.25 in | Wt 287.1 lb

## 2011-10-11 DIAGNOSIS — E669 Obesity, unspecified: Secondary | ICD-10-CM

## 2011-10-11 DIAGNOSIS — I5042 Chronic combined systolic (congestive) and diastolic (congestive) heart failure: Secondary | ICD-10-CM | POA: Diagnosis not present

## 2011-10-11 DIAGNOSIS — M25519 Pain in unspecified shoulder: Secondary | ICD-10-CM

## 2011-10-11 DIAGNOSIS — N529 Male erectile dysfunction, unspecified: Secondary | ICD-10-CM

## 2011-10-11 DIAGNOSIS — F17200 Nicotine dependence, unspecified, uncomplicated: Secondary | ICD-10-CM

## 2011-10-11 DIAGNOSIS — J449 Chronic obstructive pulmonary disease, unspecified: Secondary | ICD-10-CM

## 2011-10-11 DIAGNOSIS — I10 Essential (primary) hypertension: Secondary | ICD-10-CM

## 2011-10-11 DIAGNOSIS — Z720 Tobacco use: Secondary | ICD-10-CM

## 2011-10-11 MED ORDER — TEMAZEPAM 15 MG PO CAPS: 15.0000 mg | ORAL_CAPSULE | Freq: Every evening | ORAL | Status: DC | PRN

## 2011-10-11 MED ORDER — TADALAFIL 5 MG PO TABS: 5.0000 mg | ORAL_TABLET | Freq: Every day | ORAL | Status: DC | PRN

## 2011-10-11 MED ORDER — LABETALOL HCL 200 MG PO TABS: 200.0000 mg | ORAL_TABLET | Freq: Two times a day (BID) | ORAL | Status: DC

## 2011-10-11 NOTE — Patient Instructions (Signed)
Continue your current medication Work on the smoking! You are on a pain contract, no narcotics from other practices Your prescriptions will be sent to [facility] F/U 4 months

## 2011-10-11 NOTE — Assessment & Plan Note (Signed)
Cardiology note reviewed, no change to meds

## 2011-10-11 NOTE — Assessment & Plan Note (Signed)
Counseled on cessation, pt not interested in quitting

## 2011-10-11 NOTE — Assessment & Plan Note (Signed)
Trial of Cialis, given instructions and side effects, no history of CAD and pt not on nitrate

## 2011-10-11 NOTE — Assessment & Plan Note (Signed)
Currently stable.

## 2011-10-11 NOTE — Progress Notes (Signed)
  Subjective:    Patient ID: Brian Barrera, male    DOB: 08-Aug-1970, 41 y.o.   MRN: [id number]  HPI  Pt here to f/u COPD and medications  COPD has been stable. He has not required his short acting inhaler. He continues to use his Advair. He is not interested in quitting smoking.  Hypertension-he has been seen by cardiology secondary to cardiac clearance. At that visit echocardiogram was at which showed LVH. There was concern that maybe he did not eat his diuretics of the hydralazine however they were not discontinued. He continues to have elevated blood pressures especially before hemodialysis Erectile dysfunction-he has had problems getting his erection for many months now. He said started after he started hemodialysis. He cannot keep his erection. He denies premature ejaculation. He is in a relationship and would like to try something for this Chronic pain-I discussed his use of narcotics as well as a prescription for myself and his vascular specialist. He was not aware that he was getting prescription medication. He states it the medication I gave him did not last [employer] he would use the medication from the surgeon as well. We discussed his pain policy again today. He is aware that pain and vascular will not be prescribing any narcotic medications for him.   Review of Systems   GEN- denies fatigue, fever, weight loss,weakness, recent illness HEENT- denies eye drainage, change in vision, nasal discharge, CVS- denies chest pain, palpitations RESP- denies SOB, cough, wheeze ABD- denies N/V, change in stools, abd pain GU- denies dysuria, hematuria, dribbling, incontinence MSK- + joint pain, muscle aches, injury Neuro- denies headache, dizziness, syncope, seizure activity      Objective:   Physical Exam GEN- NAD, alert and oriented x3, obese CVS- RRR, no murmur RESP-CTAB, no rhonchi, no crackles EXT- No edema,  Pulses- Radial, DP- 2+ Graft Right forearm Psych- depressed affect, no  anxious appearing       Assessment & Plan:

## 2011-10-11 NOTE — Assessment & Plan Note (Signed)
unchanged

## 2011-10-11 NOTE — Assessment & Plan Note (Signed)
BP elevated some today, with his increasing BP before HD, would not change medications

## 2011-10-11 NOTE — Assessment & Plan Note (Signed)
Chronic pain, discussed pain policy, he will only be prescribed vicodin by my office.

## 2011-10-11 NOTE — Progress Notes (Signed)
Addended by: Vic Blackbird F on: 10/11/2011 12:47 PM   Modules accepted: Orders

## 2011-10-15 ENCOUNTER — Telehealth: Payer: Self-pay | Admitting: Family Medicine

## 2011-10-15 NOTE — Telephone Encounter (Signed)
Paper faxed back to [facility] with dea #

## 2011-10-22 DIAGNOSIS — N186 End stage renal disease: Secondary | ICD-10-CM | POA: Diagnosis not present

## 2011-10-23 DIAGNOSIS — N186 End stage renal disease: Secondary | ICD-10-CM | POA: Diagnosis not present

## 2011-10-23 DIAGNOSIS — D509 Iron deficiency anemia, unspecified: Secondary | ICD-10-CM | POA: Diagnosis not present

## 2011-10-23 DIAGNOSIS — D631 Anemia in chronic kidney disease: Secondary | ICD-10-CM | POA: Diagnosis not present

## 2011-10-23 DIAGNOSIS — N2581 Secondary hyperparathyroidism of renal origin: Secondary | ICD-10-CM | POA: Diagnosis not present

## 2011-11-03 DIAGNOSIS — N189 Chronic kidney disease, unspecified: Secondary | ICD-10-CM | POA: Diagnosis not present

## 2011-11-03 DIAGNOSIS — Z01818 Encounter for other preprocedural examination: Secondary | ICD-10-CM | POA: Diagnosis not present

## 2011-11-03 DIAGNOSIS — Z01810 Encounter for preprocedural cardiovascular examination: Secondary | ICD-10-CM | POA: Diagnosis not present

## 2011-11-19 DIAGNOSIS — N186 End stage renal disease: Secondary | ICD-10-CM | POA: Diagnosis not present

## 2011-11-19 DIAGNOSIS — T82898A Other specified complication of vascular prosthetic devices, implants and grafts, initial encounter: Secondary | ICD-10-CM | POA: Diagnosis not present

## 2011-11-21 DIAGNOSIS — N186 End stage renal disease: Secondary | ICD-10-CM | POA: Diagnosis not present

## 2011-11-23 DIAGNOSIS — D509 Iron deficiency anemia, unspecified: Secondary | ICD-10-CM | POA: Diagnosis not present

## 2011-11-23 DIAGNOSIS — D631 Anemia in chronic kidney disease: Secondary | ICD-10-CM | POA: Diagnosis not present

## 2011-11-23 DIAGNOSIS — N186 End stage renal disease: Secondary | ICD-10-CM | POA: Diagnosis not present

## 2011-11-23 DIAGNOSIS — N2581 Secondary hyperparathyroidism of renal origin: Secondary | ICD-10-CM | POA: Diagnosis not present

## 2011-11-24 DIAGNOSIS — N189 Chronic kidney disease, unspecified: Secondary | ICD-10-CM | POA: Diagnosis not present

## 2011-11-24 DIAGNOSIS — Z01811 Encounter for preprocedural respiratory examination: Secondary | ICD-10-CM | POA: Diagnosis not present

## 2011-11-24 DIAGNOSIS — I517 Cardiomegaly: Secondary | ICD-10-CM | POA: Diagnosis not present

## 2011-11-24 DIAGNOSIS — I440 Atrioventricular block, first degree: Secondary | ICD-10-CM | POA: Diagnosis not present

## 2011-11-24 DIAGNOSIS — Z01810 Encounter for preprocedural cardiovascular examination: Secondary | ICD-10-CM | POA: Diagnosis not present

## 2011-11-24 DIAGNOSIS — I059 Rheumatic mitral valve disease, unspecified: Secondary | ICD-10-CM | POA: Diagnosis not present

## 2011-12-03 ENCOUNTER — Other Ambulatory Visit: Payer: Self-pay | Admitting: Family Medicine

## 2011-12-03 MED ORDER — HYDROCODONE-ACETAMINOPHEN 5-500 MG PO TABS: 1.0000 | ORAL_TABLET | Freq: Two times a day (BID) | ORAL | Status: DC | PRN

## 2011-12-03 NOTE — Telephone Encounter (Signed)
Pain med refill °

## 2011-12-06 ENCOUNTER — Encounter: Payer: Self-pay | Admitting: Cardiology

## 2011-12-06 ENCOUNTER — Ambulatory Visit (INDEPENDENT_AMBULATORY_CARE_PROVIDER_SITE_OTHER): Payer: Medicare Other | Admitting: Cardiology

## 2011-12-06 VITALS — BP 122/80 | HR 74 | Ht 72.0 in | Wt 283.0 lb

## 2011-12-06 DIAGNOSIS — Z720 Tobacco use: Secondary | ICD-10-CM | POA: Insufficient documentation

## 2011-12-06 DIAGNOSIS — F17200 Nicotine dependence, unspecified, uncomplicated: Secondary | ICD-10-CM

## 2011-12-06 DIAGNOSIS — N186 End stage renal disease: Secondary | ICD-10-CM

## 2011-12-06 DIAGNOSIS — I10 Essential (primary) hypertension: Secondary | ICD-10-CM

## 2011-12-06 DIAGNOSIS — J449 Chronic obstructive pulmonary disease, unspecified: Secondary | ICD-10-CM

## 2011-12-06 DIAGNOSIS — I5032 Chronic diastolic (congestive) heart failure: Secondary | ICD-10-CM | POA: Insufficient documentation

## 2011-12-06 NOTE — Progress Notes (Signed)
Patient ID: Brian Barrera, male   DOB: December 24, 1970, 41 y.o.   MRN: [id number]  HPI: Scheduled return visit for this very nice young gentleman with severe hypertension and end-stage renal disease.  Uncomplicated and successful surgery was performed to establish dialysis access with recent utilization of the fistula for his dialysis treatments.  Unfortunately, he continues to smoke cigarettes, albeit at a reduced rate of 1/2 pack per day.  He reports good exercise tolerance without any other health problems.  He has been evaluated at [facility] and reportedly approved for renal transplantation.  Prior to Admission medications   Medication Sig Start Date End Date Taking? Authorizing Provider  albuterol (PROVENTIL,VENTOLIN) 90 MCG/ACT inhaler Inhale 2 puffs into the lungs every 4 (four) hours as needed for wheezing. 07/05/11 06/29/12 Yes [name], MD  amLODipine (NORVASC) 10 MG tablet Take 10 mg by mouth daily.   Yes Historical Provider, MD  aspirin 325 MG tablet Take 325 mg by mouth daily.   Yes Historical Provider, MD  b complex-vitamin c-folic acid (NEPHRO-VITE) 0.8 MG TABS Take 0.8 mg by mouth at bedtime.   Yes Historical Provider, MD  cloNIDine (CATAPRES) 0.3 MG tablet Take 0.3 mg by mouth 3 (three) times daily.   Yes Historical Provider, MD  Fluticasone-Salmeterol (ADVAIR DISKUS) 250-50 MCG/DOSE AEPB Inhale 1 puff into the lungs 2 (two) times daily. For COPD 07/05/11 07/04/12 Yes [name], MD  furosemide (LASIX) 40 MG tablet Take 80 mg by mouth 2 (two) times daily.   Yes Historical Provider, MD  hydrALAZINE (APRESOLINE) 50 MG tablet Take 50 mg by mouth 3 (three) times daily.   Yes Historical Provider, MD  HYDROcodone-acetaminophen (VICODIN) 5-500 MG per tablet Take 1 tablet by mouth 2 (two) times daily as needed for pain. 12/03/11 12/02/12 Yes [name], MD  labetalol (NORMODYNE) 200 MG tablet Take 1 tablet (200 mg total) by mouth 2 (two) times daily. 10/11/11  Yes [name], MD    oxyCODONE-acetaminophen (PERCOCET) 5-325 MG per tablet Take 1 tablet by mouth every 6 (six) hours as needed.   Yes Historical Provider, MD  sevelamer (RENVELA) 800 MG tablet Take 800 mg by mouth 3 (three) times daily with meals.   Yes Historical Provider, MD  temazepam (RESTORIL) 15 MG capsule Take 1 capsule (15 mg total) by mouth at bedtime as needed. 10/11/11  Yes [name], MD  tadalafil (CIALIS) 5 MG tablet Take 1 tablet (5 mg total) by mouth daily as needed for erectile dysfunction. 10/11/11 11/10/11  [name], MD   No Known Allergies    Past medical history, social history, and family history reviewed and updated.  ROS: Denies chest discomfort, orthopnea, PND, peripheral edema, excessive salt intake, palpitations, known hyperlipidemia or syncope.  He has some difficulty in following his renal diet.  All other systems reviewed and are negative.  PHYSICAL EXAM: BP 122/80  Pulse 74  Ht 6' (1.829 m)  Wt 128.368 kg (283 lb)  BMI 38.38 kg/m2  SpO2 97%  General-Well developed; no acute distress Body habitus-Moderately overweight Neck-No JVD; no carotid bruits Lungs-Inspiratory and expiratory rhonchi; prolonged expiratory phase; resonant to percussion Cardiovascular-normal PMI; normal S1 and S2; modest basilar systolic murmur Abdomen-normal bowel sounds; soft and non-tender without masses or organomegaly Musculoskeletal-No deformities, no cyanosis or clubbing Neurologic-Normal cranial nerves; symmetric strength and tone Skin-Warm, no significant lesions Extremities-distal pulses intact; no edema  ASSESSMENT AND PLAN:  Jacqulyn Ducking, MD 12/06/2011 11:47 AM

## 2011-12-06 NOTE — Assessment & Plan Note (Signed)
Hypertensive control has improved since dialysis was initiated although a multidrug regimen is still required.  Patient reports BP is typically slightly elevated prior to dialysis, but normal throughout and there after.  Dr. Lowanda Foster has the best information to allow him to adjust antihypertensive therapy.

## 2011-12-06 NOTE — Assessment & Plan Note (Signed)
Patient is tolerating dialysis well.  I cannot locate a lipid profile and will ask that one be performed at his next treatment.

## 2011-12-06 NOTE — Progress Notes (Deleted)
Name: Brian Barrera    DOB: February 23, 1971  Age: 41 y.o.  MR#: [id number]       PCP:  Vic Blackbird, MD      Insurance: @PAYORNAME @   CC:    Chief Complaint  Patient presents with  . HYPERTENSION    NO COMPLAINTS/ MED BOTTLES/ TC    VS BP 122/80  Pulse 74  Ht 6' (1.829 m)  Wt 283 lb (128.368 kg)  BMI 38.38 kg/m2  SpO2 97%  Weights Current Weight  12/06/11 283 lb (128.368 kg)  10/11/11 287 lb 1.3 oz (130.219 kg)  09/03/11 286 lb (129.729 kg)    Blood Pressure  BP Readings from Last 3 Encounters:  12/06/11 122/80  10/11/11 142/84  09/03/11 138/78     Admit date:  (Not on file) Last encounter with RMR:  09/06/2011   Allergy No Known Allergies  Current Outpatient Prescriptions  Medication Sig Dispense Refill  . albuterol (PROVENTIL,VENTOLIN) 90 MCG/ACT inhaler Inhale 2 puffs into the lungs every 4 (four) hours as needed for wheezing.  17 g  3  . amLODipine (NORVASC) 10 MG tablet Take 10 mg by mouth daily.      [name] aspirin 81 MG tablet Take 160 mg by mouth daily.      [name] b complex-vitamin c-folic acid (NEPHRO-VITE) 0.8 MG TABS Take 0.8 mg by mouth at bedtime.      . cloNIDine (CATAPRES) 0.3 MG tablet Take 0.3 mg by mouth 3 (three) times daily.      . Fluticasone-Salmeterol (ADVAIR DISKUS) 250-50 MCG/DOSE AEPB Inhale 1 puff into the lungs 2 (two) times daily. For COPD  1 each  6  . furosemide (LASIX) 40 MG tablet Take 80 mg by mouth 2 (two) times daily.      . hydrALAZINE (APRESOLINE) 50 MG tablet Take 50 mg by mouth 3 (three) times daily.      [name] HYDROcodone-acetaminophen (VICODIN) 5-500 MG per tablet Take 1 tablet by mouth 2 (two) times daily as needed for pain.  60 tablet  1  . labetalol (NORMODYNE) 200 MG tablet Take 1 tablet (200 mg total) by mouth 2 (two) times daily.  60 tablet  0  . oxyCODONE-acetaminophen (PERCOCET) 5-325 MG per tablet Take 1 tablet by mouth every 6 (six) hours as needed.      . sevelamer (RENVELA) 800 MG tablet Take 800 mg by mouth 3 (three) times  daily with meals.      . temazepam (RESTORIL) 15 MG capsule Take 1 capsule (15 mg total) by mouth at bedtime as needed.  90 capsule  1  . tadalafil (CIALIS) 5 MG tablet Take 1 tablet (5 mg total) by mouth daily as needed for erectile dysfunction.  30 tablet  0    Discontinued Meds:   There are no discontinued medications.  Patient Active Problem List  Diagnosis  . ESRD (end stage renal disease) on dialysis  . Anemia of renal disease  . Obesity  . Insomnia  . Depressed affect  . ED (erectile dysfunction)  . Hypertension  . Chronic obstructive pulmonary disease  . Chronic diastolic heart failure  . Tobacco abuse    LABS No visits with results within 3 Month(s) from this visit. Latest known visit with results is:  Admission on 07/25/2010, Discharged on 07/28/2010  No results displayed because visit has over 200 results.       Results for this Opt Visit:     Results for orders placed during the hospital encounter of  07/25/10  POCT CARDIAC MARKERS      Component Value Range   Myoglobin, poc >500 (*) 12 - 200 ng/mL   CKMB, poc 12.4 (*) 1.0 - 8.0 ng/mL   Troponin i, poc <0.05  0.00 - 0.09 ng/mL   Comment       Value:            TROPONIN VALUES IN THE RANGE     OF 0.00-0.09 ng/mL SHOW     NO INDICATION OF     MYOCARDIAL INJURY.                PERSISTENTLY INCREASED TROPONIN     VALUES IN THE RANGE OF 0.10-0.24     ng/mL CAN BE SEEN IN:           -UNSTABLE ANGINA           -CONGESTIVE HEART FAILURE           -MYOCARDITIS           -CHEST TRAUMA           -ARRYHTHMIAS           -LATE PRESENTING MI           -COPD       CLINICAL FOLLOW-UP RECOMMENDED.                TROPONIN VALUES >=0.25 ng/mL     INDICATE POSSIBLE MYOCARDIAL     ISCHEMIA. SERIAL TESTING     RECOMMENDED.  DIFFERENTIAL      Component Value Range   Neutrophils Relative 81 (*) 43 - 77 %   Neutro Abs 8.7 (*) 1.7 - 7.7 K/uL   Lymphocytes Relative 7 (*) 12 - 46 %   Lymphs Abs 0.8  0.7 - 4.0 K/uL    Monocytes Relative 9  3 - 12 %   Monocytes Absolute 1.0  0.1 - 1.0 K/uL   Eosinophils Relative 3  0 - 5 %   Eosinophils Absolute 0.3  0.0 - 0.7 K/uL   Basophils Relative 0  0 - 1 %   Basophils Absolute 0.0  0.0 - 0.1 K/uL  CBC      Component Value Range   WBC 10.8 (*) 4.0 - 10.5 K/uL   RBC 4.29  4.22 - 5.81 MIL/uL   Hemoglobin 11.9 (*) 13.0 - 17.0 g/dL   HCT 37.8 (*) 39.0 - 52.0 %   MCV 88.1  78.0 - 100.0 fL   MCH 27.7  26.0 - 34.0 pg   MCHC 31.5  30.0 - 36.0 g/dL   RDW 15.7 (*) 11.5 - 15.5 %   Platelets 193  150 - 400 K/uL  BASIC METABOLIC PANEL      Component Value Range   Sodium 139  135 - 145 mEq/L   Potassium 4.3  3.5 - 5.1 mEq/L   Chloride 106  96 - 112 mEq/L   CO2 23  19 - 32 mEq/L   Glucose, Bld 117 (*) 70 - 99 mg/dL   BUN 50 (*) 6 - 23 mg/dL   Creatinine, Ser 5.70 (*) 0.4 - 1.5 mg/dL   Calcium 9.0  8.4 - 10.5 mg/dL   GFR calc non Af Amer 11 (*) >60 mL/min   GFR calc Af Amer   (*) >60 mL/min   Value: 13            The eGFR has been calculated     using the MDRD equation.  This calculation has not been     validated in all clinical     situations.     eGFR's persistently     <60 mL/min signify     possible Chronic Kidney Disease.  BRAIN NATRIURETIC PEPTIDE      Component Value Range   Pro B Natriuretic peptide (BNP) 530.0 (*) 0.0 - 100.0 pg/mL  BLOOD GAS, ARTERIAL      Component Value Range   FIO2 0.21     O2 Content 21.0     Delivery systems ROOM AIR     pH, Arterial 7.336 (*) 7.350 - 7.450   pCO2 arterial 42.7  35.0 - 45.0 mmHg   pO2, Arterial 45.4 (*) 80.0 - 100.0 mmHg   Bicarbonate 22.2  20.0 - 24.0 mEq/L   TCO2 20.4  0 - 100 mmol/L   Acid-base deficit 2.7 (*) 0.0 - 2.0 mmol/L   O2 Saturation 77.0     Patient temperature 37.0     Collection site RIGHT RADIAL     Drawn by [phone]     Sample type ARTERIAL     Allens test (pass/fail) PASS  PASS  CK TOTAL AND CKMB      Component Value Range   Total CK 998 (*) 7 - 232 U/L   CK, MB   (*) 0.3 - 4.0  ng/mL   Value: 15.5 CRITICAL RESULT CALLED TO, READ BACK BY AND VERIFIED WITH: YOUNG J AT 1510 ON 07/25/10 BY VASSER A   Relative Index 1.6  0.0 - 2.5  TROPONIN I      Component Value Range   Troponin I    0.00 - 0.06 ng/mL   Value: 0.03            NO INDICATION OF     MYOCARDIAL INJURY.  PROTIME-INR      Component Value Range   Prothrombin Time 14.0  11.6 - 15.2 seconds   INR 1.06  0.00 - 1.49  APTT      Component Value Range   aPTT 34  24 - 37 seconds  CARDIAC PANEL(CRET KIN+CKTOT+MB+TROPI)      Component Value Range   Total CK 959 (*) 7 - 232 U/L   CK, MB   (*) 0.3 - 4.0 ng/mL   Value: 15.3 CRITICAL VALUE NOTED.  VALUE IS CONSISTENT WITH PREVIOUSLY REPORTED AND CALLED VALUE.   Troponin I    0.00 - 0.06 ng/mL   Value: 0.04            NO INDICATION OF     MYOCARDIAL INJURY.   Relative Index 1.6  0.0 - 2.5  MRSA PCR SCREENING      Component Value Range   MRSA by PCR    NEGATIVE   Value: NEGATIVE            The GeneXpert MRSA Assay (FDA     approved for NASAL specimens     only), is one component of a     comprehensive MRSA colonization     surveillance program. It is not     intended to diagnose MRSA     infection nor to guide or     monitor treatment for     MRSA infections.  GLUCOSE, CAPILLARY      Component Value Range   Glucose-Capillary 163 (*) 70 - 99 mg/dL  CARDIAC PANEL(CRET KIN+CKTOT+MB+TROPI)      Component Value Range   Total CK 854 (*) 7 - 232 U/L  CK, MB   (*) 0.3 - 4.0 ng/mL   Value: 12.7 CRITICAL VALUE NOTED.  VALUE IS CONSISTENT WITH PREVIOUSLY REPORTED AND CALLED VALUE.   Troponin I    0.00 - 0.06 ng/mL   Value: 0.04            NO INDICATION OF     MYOCARDIAL INJURY.   Relative Index 1.5  0.0 - 2.5  CBC      Component Value Range   WBC 8.4  4.0 - 10.5 K/uL   RBC 4.03 (*) 4.22 - 5.81 MIL/uL   Hemoglobin 11.1 (*) 13.0 - 17.0 g/dL   HCT 36.0 (*) 39.0 - 52.0 %   MCV 89.3  78.0 - 100.0 fL   MCH 27.5  26.0 - 34.0 pg   MCHC 30.8  30.0 - 36.0 g/dL     RDW 15.7 (*) 11.5 - 15.5 %   Platelets 184  150 - 400 K/uL  RENAL FUNCTION PANEL      Component Value Range   Sodium 142  135 - 145 mEq/L   Potassium 5.0  3.5 - 5.1 mEq/L   Chloride 108  96 - 112 mEq/L   CO2 23  19 - 32 mEq/L   Glucose, Bld 147 (*) 70 - 99 mg/dL   BUN 58 (*) 6 - 23 mg/dL   Creatinine, Ser 6.03 (*) 0.4 - 1.5 mg/dL   Calcium 9.0  8.4 - 10.5 mg/dL   Phosphorus 4.2  2.3 - 4.6 mg/dL   Albumin 3.7  3.5 - 5.2 g/dL   GFR calc non Af Amer 10 (*) >60 mL/min   GFR calc Af Amer   (*) >60 mL/min   Value: 13            The eGFR has been calculated     using the MDRD equation.     This calculation has not been     validated in all clinical     situations.     eGFR's persistently     <60 mL/min signify     possible Chronic Kidney Disease.  GLUCOSE, CAPILLARY      Component Value Range   Glucose-Capillary 169 (*) 70 - 99 mg/dL   Comment 1 Notify RN     Comment 2 Documented in Chart    BRAIN NATRIURETIC PEPTIDE      Component Value Range   Pro B Natriuretic peptide (BNP) 515.0 (*) 0.0 - 100.0 pg/mL  CARDIAC PANEL(CRET KIN+CKTOT+MB+TROPI)      Component Value Range   Total CK 731 (*) 7 - 232 U/L   CK, MB   (*) 0.3 - 4.0 ng/mL   Value: 12.6 CRITICAL VALUE NOTED.  VALUE IS CONSISTENT WITH PREVIOUSLY REPORTED AND CALLED VALUE.   Troponin I    0.00 - 0.06 ng/mL   Value: 0.03            NO INDICATION OF     MYOCARDIAL INJURY.   Relative Index 1.7  0.0 - 2.5  GLUCOSE, CAPILLARY      Component Value Range   Glucose-Capillary 174 (*) 70 - 99 mg/dL   Comment 1 Documented in Chart     Comment 2 Notify RN    DRUG SCREEN PANEL, EMERGENCY      Component Value Range   Opiates NONE DETECTED  NONE DETECTED   Cocaine NONE DETECTED  NONE DETECTED   Benzodiazepines NONE DETECTED  NONE DETECTED   Amphetamines NONE DETECTED  NONE DETECTED  Tetrahydrocannabinol NONE DETECTED  NONE DETECTED   Barbiturates    NONE DETECTED   Value: NONE DETECTED            DRUG SCREEN FOR MEDICAL  PURPOSES     ONLY.  IF CONFIRMATION IS NEEDED     FOR ANY PURPOSE, NOTIFY LAB     WITHIN 5 DAYS.                LOWEST DETECTABLE LIMITS     FOR URINE DRUG SCREEN     Drug Class       Cutoff (ng/mL)     Amphetamine      1000     Barbiturate      200     Benzodiazepine   [id number]     Tricyclics       [id number]     Opiates          300     Cocaine          300     THC              50  URINALYSIS, ROUTINE W REFLEX MICROSCOPIC      Component Value Range   Color, Urine YELLOW  YELLOW   APPearance CLEAR  CLEAR   Specific Gravity, Urine 1.025  1.005 - 1.030   pH 5.0  5.0 - 8.0   Glucose, UA NEGATIVE  NEGATIVE mg/dL   Hgb urine dipstick TRACE (*) NEGATIVE   Bilirubin Urine NEGATIVE  NEGATIVE   Ketones, ur NEGATIVE  NEGATIVE mg/dL   Protein, ur 100 (*) NEGATIVE mg/dL   Urobilinogen, UA 0.2  0.0 - 1.0 mg/dL   Nitrite NEGATIVE  NEGATIVE   Leukocytes, UA NEGATIVE  NEGATIVE  URINE MICROSCOPIC-ADD ON      Component Value Range   WBC, UA 3-6  <3 WBC/hpf   RBC / HPF 3-6  <3 RBC/hpf   Bacteria, UA RARE  RARE   Casts GRANULAR CAST (*) NEGATIVE  GLUCOSE, CAPILLARY      Component Value Range   Glucose-Capillary 136 (*) 70 - 99 mg/dL   Comment 1 Notify RN     Comment 2 Documented in Chart    GLUCOSE, CAPILLARY      Component Value Range   Glucose-Capillary 140 (*) 70 - 99 mg/dL  CULTURE, RESPIRATORY      Component Value Range   Specimen Description SPUTUM     Special Requests NONE     Gram Stain       Value: RARE WBC PRESENT, PREDOMINANTLY PMN     FEW SQUAMOUS EPITHELIAL CELLS PRESENT     MODERATE GRAM NEGATIVE RODS     FEW GRAM POSITIVE RODS     FEW GRAM POSITIVE COCCI IN PAIRS     IN CHAINS   Culture NORMAL OROPHARYNGEAL FLORA     Report Status 07/28/2010 FINAL    CBC      Component Value Range   WBC 11.2 (*) 4.0 - 10.5 K/uL   RBC 4.05 (*) 4.22 - 5.81 MIL/uL   Hemoglobin 11.3 (*) 13.0 - 17.0 g/dL   HCT 36.3 (*) 39.0 - 52.0 %   MCV 89.6  78.0 - 100.0 fL   MCH 27.9  26.0 - 34.0 pg    MCHC 31.1  30.0 - 36.0 g/dL   RDW 15.7 (*) 11.5 - 15.5 %   Platelets 185  150 - 400 K/uL  RENAL FUNCTION PANEL      Component  Value Range   Sodium 138  135 - 145 mEq/L   Potassium 4.9  3.5 - 5.1 mEq/L   Chloride 104  96 - 112 mEq/L   CO2 22  19 - 32 mEq/L   Glucose, Bld 179 (*) 70 - 99 mg/dL   BUN 75 (*) 6 - 23 mg/dL   Creatinine, Ser 6.46 (*) 0.4 - 1.5 mg/dL   Calcium 8.6  8.4 - 10.5 mg/dL   Phosphorus 3.9  2.3 - 4.6 mg/dL   Albumin 3.6  3.5 - 5.2 g/dL   GFR calc non Af Amer 10 (*) >60 mL/min   GFR calc Af Amer   (*) >60 mL/min   Value: 12            The eGFR has been calculated     using the MDRD equation.     This calculation has not been     validated in all clinical     situations.     eGFR's persistently     <60 mL/min signify     possible Chronic Kidney Disease.  GLUCOSE, CAPILLARY      Component Value Range   Glucose-Capillary 136 (*) 70 - 99 mg/dL  DIFFERENTIAL      Component Value Range   Neutrophils Relative 92 (*) 43 - 77 %   Neutro Abs 9.2 (*) 1.7 - 7.7 K/uL   Lymphocytes Relative 6 (*) 12 - 46 %   Lymphs Abs 0.6 (*) 0.7 - 4.0 K/uL   Monocytes Relative 3  3 - 12 %   Monocytes Absolute 0.3  0.1 - 1.0 K/uL   Eosinophils Relative 0  0 - 5 %   Eosinophils Absolute 0.0  0.0 - 0.7 K/uL   Basophils Relative 0  0 - 1 %   Basophils Absolute 0.0  0.0 - 0.1 K/uL  CBC      Component Value Range   WBC 10.1  4.0 - 10.5 K/uL   RBC 4.10 (*) 4.22 - 5.81 MIL/uL   Hemoglobin 11.3 (*) 13.0 - 17.0 g/dL   HCT 36.6 (*) 39.0 - 52.0 %   MCV 89.3  78.0 - 100.0 fL   MCH 27.6  26.0 - 34.0 pg   MCHC 30.9  30.0 - 36.0 g/dL   RDW 15.8 (*) 11.5 - 15.5 %   Platelets 181  150 - 400 K/uL  RENAL FUNCTION PANEL      Component Value Range   Sodium 137  135 - 145 mEq/L   Potassium 4.6  3.5 - 5.1 mEq/L   Chloride 104  96 - 112 mEq/L   CO2 20  19 - 32 mEq/L   Glucose, Bld 213 (*) 70 - 99 mg/dL   BUN 80 (*) 6 - 23 mg/dL   Creatinine, Ser 6.67 (*) 0.4 - 1.5 mg/dL   Calcium 8.5  8.4 -  10.5 mg/dL   Phosphorus 5.0 (*) 2.3 - 4.6 mg/dL   Albumin 3.7  3.5 - 5.2 g/dL   GFR calc non Af Amer 9 (*) >60 mL/min   GFR calc Af Amer   (*) >60 mL/min   Value: 11            The eGFR has been calculated     using the MDRD equation.     This calculation has not been     validated in all clinical     situations.     eGFR's persistently     <60 mL/min signify  possible Chronic Kidney Disease.  GLUCOSE, CAPILLARY      Component Value Range   Glucose-Capillary 189 (*) 70 - 99 mg/dL  GLUCOSE, CAPILLARY      Component Value Range   Glucose-Capillary 174 (*) 70 - 99 mg/dL  HEPATITIS B SURFACE ANTIGEN      Component Value Range   Hepatitis B Surface Ag NEGATIVE  NEGATIVE  HEPATITIS C ANTIBODY      Component Value Range   HCV Ab NEGATIVE  NEGATIVE  GLUCOSE, CAPILLARY      Component Value Range   Glucose-Capillary 162 (*) 70 - 99 mg/dL   Comment 1 Notify RN     Comment 2 Documented in Chart    BASIC METABOLIC PANEL      Component Value Range   Sodium 133 (*) 135 - 145 mEq/L   Potassium 4.7  3.5 - 5.1 mEq/L   Chloride 102  96 - 112 mEq/L   CO2 21  19 - 32 mEq/L   Glucose, Bld 169 (*) 70 - 99 mg/dL   BUN 90 (*) 6 - 23 mg/dL   Creatinine, Ser 6.82 (*) 0.4 - 1.5 mg/dL   Calcium 8.2 (*) 8.4 - 10.5 mg/dL   GFR calc non Af Amer 9 (*) >60 mL/min   GFR calc Af Amer   (*) >60 mL/min   Value: 11            The eGFR has been calculated     using the MDRD equation.     This calculation has not been     validated in all clinical     situations.     eGFR's persistently     <60 mL/min signify     possible Chronic Kidney Disease.  CBC      Component Value Range   WBC 9.5  4.0 - 10.5 K/uL   RBC 4.06 (*) 4.22 - 5.81 MIL/uL   Hemoglobin 11.3 (*) 13.0 - 17.0 g/dL   HCT 36.2 (*) 39.0 - 52.0 %   MCV 89.2  78.0 - 100.0 fL   MCH 27.8  26.0 - 34.0 pg   MCHC 31.2  30.0 - 36.0 g/dL   RDW 15.8 (*) 11.5 - 15.5 %   Platelets 200  150 - 400 K/uL  HEPATITIS B CORE ANTIBODY, IGM       Component Value Range   Hep B C IgM    NEGATIVE   Value: NEGATIVE     (NOTE) High levels of Hepatitis B Core IgM antibody are detectable during the acute stage of Hepatitis B. This antibody is used to differentiate current from past HBV infection.   GLUCOSE, CAPILLARY      Component Value Range   Glucose-Capillary 153 (*) 70 - 99 mg/dL  GLUCOSE, CAPILLARY      Component Value Range   Glucose-Capillary 175 (*) 70 - 99 mg/dL    EKG Orders placed in visit on 09/06/11  . EKG 12-LEAD     Prior Assessment and Plan Problem List as of 12/06/2011            Cardiology Problems   Hypertension   Chronic diastolic heart failure     Other   ESRD (end stage renal disease) on dialysis   Last Assessment & Plan Note   09/03/2011 Office Visit Addendum 09/09/2011  9:46 AM by Yehuda Savannah, MD    Dialysis is proceeding via central line pending surgical creation of new vascular access.  The planned surgery  Is associated with modest physiologic stress and low perioperative risk.  No preoperative testing nor change in medical therapy would likely significantly lower the magnitude of the small risk. Surgery can proceed as planned at the surgeon's and patient's earliest mutual convenience.    Anemia of renal disease   Last Assessment & Plan Note   09/03/2011 Office Visit Addendum 09/03/2011  4:10 PM by Yehuda Savannah, MD    Anemia is minimal and does not contribute to perioperative risk for the relatively minor surgical procedure that is proposed.    Obesity   Last Assessment & Plan Note   10/11/2011 Office Visit Signed 10/11/2011 12:21 PM by [name], MD    unchanged    Insomnia   Last Assessment & Plan Note   08/09/2011 Office Visit Signed 08/11/2011  6:07 PM by [name], MD    Trial of ambien    Depressed affect   Last Assessment & Plan Note   08/09/2011 Office Visit Signed 08/11/2011  6:07 PM by [name], MD    He has a quite depressed affect, he is very young and in  now on HD with difficulties with COPD, HTN and mild heart failure. He is concerned he is already experiencing trouble with his HD sites and he knows he needs HD in order to live. He declines any medications to help his mood, he stays to himself does not see family very much, encouraged him to get out and visit with friends and family. I will give him a trial of ambien for sleep    ED (erectile dysfunction)   Last Assessment & Plan Note   10/11/2011 Office Visit Signed 10/11/2011 12:18 PM by [name], MD    Trial of Cialis, given instructions and side effects, no history of CAD and pt not on nitrate    Chronic obstructive pulmonary disease   Tobacco abuse       Imaging: No results found.   FRS Calculation: Score not calculated. Missing: Total Cholesterol, HDL

## 2011-12-06 NOTE — Assessment & Plan Note (Addendum)
LV systolic function has returned to normal at some point over the past 2 years.  CHF will likely be controlled by dialysis.  He has no symptoms to suggest coronary disease, but was advised of his considerably increased risk, and the importance of optimal control of risk factors was emphasized.

## 2011-12-06 NOTE — Assessment & Plan Note (Signed)
Patient has physical findings of bronchospasm without current symptoms.  He might benefit from treatment with bronchodilators or steroids via metered dose inhaler.

## 2011-12-06 NOTE — Assessment & Plan Note (Signed)
Patient last stopped smoking when he was in the [employer] and was forced to.  Otherwise, he has not abstained for more than a week or so.  He was advised to set a date for a quit attempt, and if unsuccessful, to use nicotine patches or to seek a prescription for an agent to assist with tobacco cessation.

## 2011-12-06 NOTE — Patient Instructions (Addendum)
Your physician recommends that you schedule a follow-up appointment in: As needed  Draw lipids at next dialysis blood draw - Script provided

## 2011-12-13 ENCOUNTER — Ambulatory Visit (INDEPENDENT_AMBULATORY_CARE_PROVIDER_SITE_OTHER): Payer: Medicare Other | Admitting: Family Medicine

## 2011-12-13 ENCOUNTER — Encounter: Payer: Self-pay | Admitting: Family Medicine

## 2011-12-13 VITALS — BP 120/90 | HR 78 | Resp 16 | Ht 72.25 in | Wt 282.1 lb

## 2011-12-13 DIAGNOSIS — Z0000 Encounter for general adult medical examination without abnormal findings: Secondary | ICD-10-CM | POA: Diagnosis not present

## 2011-12-13 DIAGNOSIS — J209 Acute bronchitis, unspecified: Secondary | ICD-10-CM

## 2011-12-13 MED ORDER — GUAIFENESIN-CODEINE 100-10 MG/5ML PO SYRP: 5.0000 mL | ORAL_SOLUTION | Freq: Two times a day (BID) | ORAL | Status: DC | PRN

## 2011-12-13 MED ORDER — AZITHROMYCIN 250 MG PO TABS: ORAL_TABLET | ORAL | Status: AC

## 2011-12-13 NOTE — Patient Instructions (Addendum)
I recommend eye visit once a year I recommend dental visit every 6 months Goal is to  Exercise 30 minutes 5 days a week I will call for the cholesterol labs  Use the albuterol  Use the advair twice a day  Take the antibiotics and cough medication  F/U in 3 months- please change f/u visit

## 2011-12-13 NOTE — Progress Notes (Signed)
Subjective:    Patient ID: Brian Barrera, male    DOB: 10-09-1970, 41 y.o.   MRN: [id number]  HPI Subjective:   Patient presents for Medicare Annual/Subsequent preventive examination.   Only concern is cough with production for past 5 days, no SOB, no CP, no fever. On HD.  Cough keeping him up at night, no wheeze, has not required albuterol Smoker has COPD, has not been using advair on regular basis  Review Past Medical/Family/Social: per EMR    Risk Factors  Current exercise habits: None  Dietary issues discussed:  Renal diet   Cardiac risk factors: Obesity (BMI >= 30 kg/m2).   Depression Screen  (Note: if answer to either of the following is "Yes", a more complete depression screening is indicated)  Over the past two weeks, have you felt down, depressed or hopeless? No Over the past two weeks, have you felt little interest or pleasure in doing things? Yes Have you lost interest or pleasure in daily life? No Do you often feel hopeless? No Do you cry easily over simple problems? No   Activities of Daily Living  In your present state of health, do you have any difficulty performing the following activities?:  Driving? No  Managing money? No  Feeding yourself? No  Getting from bed to chair? No  Climbing a flight of stairs? No  Preparing food and eating?: No  Bathing or showering? No  Getting dressed: No  Getting to the toilet? No  Using the toilet:No  Moving around from place to place: No  In the past year have you fallen or had a near fall?:No  Are you sexually active? Yes Do you have more than one partner? No   Hearing Difficulties: No  Do you often ask people to speak up or repeat themselves? No  Do you experience ringing or noises in your ears? No Do you have difficulty understanding soft or whispered voices? No  Do you feel that you have a problem with memory? No Do you often misplace items? No  Do you feel safe at home? Yes  Cognitive Testing  Alert? Yes  Normal Appearance?Yes  Oriented to person? Yes Place? Yes  Time? Yes  Recall of three objects? Yes  Can perform simple calculations? Yes  Displays appropriate judgment?Yes  Can read the correct time from a watch face?Yes   List the Names of Other Physician/Practitioners you currently use:   Dr. Birdie Sons- Renal,  Dr. Lattie Haw- Cardiology, [facility]   Indicate any recent Medical Services you may have received from other than [facility] providers in the past year (date may be approximate).   [facility]-  [place] for fistula   Screening Tests / Date Colonoscopy- not due until age 50                      Influenza Vaccine - 2012 Tetanus/tdap- 2012    Assessment:    Annual wellness medicare exam   Plan:    During the course of the visit the patient was educated and counseled about appropriate screening and preventive services including:    Screen negative for depression  Lipid panel to be reviewed - obtain for [facility]/nephrology  Counsled on tobacco cessation - pt not ready to quit   Diet review for nutrition referral? Yes ____ Not Indicated __x__  Patient Instructions (the written plan) was given to the patient.  Medicare Attestation  I have personally reviewed:  The patient's medical and social history  Their  use of alcohol, tobacco or illicit drugs  Their current medications and supplements  The patient's functional ability including ADLs,fall risks, home safety risks, cognitive, and hearing and visual impairment  Diet and physical activities  Evidence for depression or mood disorders  The patient's weight, height, BMI, and visual acuity have been recorded in the chart. I have made referrals, counseling, and provided education to the patient based on review of the above and I have provided the patient with a written personalized care plan for preventive services.        Review of Systems - per above    GEN- denies fatigue, fever, weight loss,weakness, recent illness HEENT- denies  eye drainage, change in vision,+ nasal discharge, CVS- denies chest pain, palpitations RESP- denies SOB, +cough, wheeze ABD- denies N/V, change in stools, abd pain GU- denies dysuria, hematuria, dribbling, incontinence MSK- + joint pain, muscle aches, injury Neuro- denies headache, dizziness, syncope, seizure activity      Objective:   Physical Exam GEN- NAD, alert and oriented x3, obese HEENT- PERRL , EOMI, oropharynx clear, MMM, TM clear no effusion left side, right side wax obscurring TM , sinus non tender  Neck- no LAD  CVS- RRR, no murmur RESP-CTAB, no rhonchi, no crackles, normal WOB  EXT- No edema,  Pulses- Radial, DP- 2+ GU- Rectal tone normal, prostate smooth , soft brown stool  FOBT negative Graft Right forearm Psych- depressed affect, no anxious appearing        Assessment & Plan:    Acute bronchitis- treat with  zpak and cough suppressant, restart advair, albuterol prn, hold on steroids

## 2011-12-20 ENCOUNTER — Telehealth: Payer: Self-pay | Admitting: Cardiology

## 2011-12-20 NOTE — Telephone Encounter (Signed)
[facility] IS CALLING WANTING SURGICAL CLEARANCE ON PT FOR KIDNEY TRANSPLANT. THEY ARE REQUESTING THAT WE DO A STRESS TEST.

## 2011-12-21 NOTE — Telephone Encounter (Signed)
Patient has not been seen since April and will need an appointment prior to scheduling any further testing and clearance.

## 2011-12-21 NOTE — Telephone Encounter (Signed)
i show 12/06/11 for last appt, will that change anything?

## 2011-12-22 ENCOUNTER — Encounter: Payer: Self-pay | Admitting: *Deleted

## 2011-12-22 ENCOUNTER — Encounter: Payer: Self-pay | Admitting: Cardiology

## 2011-12-22 DIAGNOSIS — N186 End stage renal disease: Secondary | ICD-10-CM | POA: Diagnosis not present

## 2011-12-22 NOTE — Telephone Encounter (Signed)
Will fax note to Dr Francee Piccolo c/o priscilla at [phone]

## 2011-12-22 NOTE — Telephone Encounter (Signed)
Please advise asap

## 2011-12-22 NOTE — Telephone Encounter (Addendum)
12:20 PM  Low to moderate risk surgery planned.  Patient has no symptoms to suggest significant coronary artery disease.  Although based upon his multiple comorbidities, surgical risk is increased relative to age match cohort, no cardiac testing is required preoperatively.  Chronic treatment with beta blocker afford some measure of cardiac protection perioperatively.  2:20 PM Echocardiogram and  echocardiogram performed at [facility] earlier this month reviewed at the request of the transplant coordinator.  Echocardiogram was normal except for the presence of moderate LVH, mild left atrial enlargement and mild mitral regurgitation.  Pharmacologic stress echocardiographic examination was entirely inadequate.  Dobutamine infusion resulted in marked hypertension with a maximal value of 226/127 mmHg but a peak heart rate of only 75 bpm.  Although the stress test was inadequate and provides little diagnostic information, it was not indicated in the first place.  In the absence of symptoms in a patient with a reasonable ability to exert himself, preoperative stress testing has not been shown to be of value and is not thought to be of value by an expert consensus panel of the [employer] of Cardiology.  If preoperative stress testing is considered absolutely necessary, a pharmacologic stress nuclear study can be performed with adenosine or a similar agent.

## 2011-12-23 DIAGNOSIS — N2581 Secondary hyperparathyroidism of renal origin: Secondary | ICD-10-CM | POA: Diagnosis not present

## 2011-12-23 DIAGNOSIS — N186 End stage renal disease: Secondary | ICD-10-CM | POA: Diagnosis not present

## 2011-12-23 DIAGNOSIS — D631 Anemia in chronic kidney disease: Secondary | ICD-10-CM | POA: Diagnosis not present

## 2011-12-23 DIAGNOSIS — D509 Iron deficiency anemia, unspecified: Secondary | ICD-10-CM | POA: Diagnosis not present

## 2011-12-28 DIAGNOSIS — N186 End stage renal disease: Secondary | ICD-10-CM | POA: Diagnosis not present

## 2011-12-30 DIAGNOSIS — N186 End stage renal disease: Secondary | ICD-10-CM | POA: Diagnosis not present

## 2012-01-10 DIAGNOSIS — Z01818 Encounter for other preprocedural examination: Secondary | ICD-10-CM | POA: Diagnosis not present

## 2012-01-10 DIAGNOSIS — R9431 Abnormal electrocardiogram [ECG] [EKG]: Secondary | ICD-10-CM | POA: Diagnosis not present

## 2012-01-22 DIAGNOSIS — N186 End stage renal disease: Secondary | ICD-10-CM | POA: Diagnosis not present

## 2012-01-25 DIAGNOSIS — N2581 Secondary hyperparathyroidism of renal origin: Secondary | ICD-10-CM | POA: Diagnosis not present

## 2012-01-25 DIAGNOSIS — D509 Iron deficiency anemia, unspecified: Secondary | ICD-10-CM | POA: Diagnosis not present

## 2012-01-25 DIAGNOSIS — Z23 Encounter for immunization: Secondary | ICD-10-CM | POA: Diagnosis not present

## 2012-01-25 DIAGNOSIS — D631 Anemia in chronic kidney disease: Secondary | ICD-10-CM | POA: Diagnosis not present

## 2012-01-25 DIAGNOSIS — N186 End stage renal disease: Secondary | ICD-10-CM | POA: Diagnosis not present

## 2012-01-27 DIAGNOSIS — N186 End stage renal disease: Secondary | ICD-10-CM | POA: Diagnosis not present

## 2012-01-31 DIAGNOSIS — N186 End stage renal disease: Secondary | ICD-10-CM | POA: Diagnosis not present

## 2012-01-31 DIAGNOSIS — Z01810 Encounter for preprocedural cardiovascular examination: Secondary | ICD-10-CM | POA: Diagnosis not present

## 2012-01-31 DIAGNOSIS — F17200 Nicotine dependence, unspecified, uncomplicated: Secondary | ICD-10-CM | POA: Diagnosis not present

## 2012-01-31 DIAGNOSIS — I120 Hypertensive chronic kidney disease with stage 5 chronic kidney disease or end stage renal disease: Secondary | ICD-10-CM | POA: Diagnosis not present

## 2012-01-31 DIAGNOSIS — J449 Chronic obstructive pulmonary disease, unspecified: Secondary | ICD-10-CM | POA: Diagnosis not present

## 2012-01-31 DIAGNOSIS — I509 Heart failure, unspecified: Secondary | ICD-10-CM | POA: Diagnosis not present

## 2012-02-01 DIAGNOSIS — R0789 Other chest pain: Secondary | ICD-10-CM | POA: Diagnosis not present

## 2012-02-01 DIAGNOSIS — I519 Heart disease, unspecified: Secondary | ICD-10-CM | POA: Diagnosis not present

## 2012-02-04 ENCOUNTER — Other Ambulatory Visit: Payer: Self-pay

## 2012-02-04 MED ORDER — HYDROCODONE-ACETAMINOPHEN 5-500 MG PO TABS: 1.0000 | ORAL_TABLET | Freq: Two times a day (BID) | ORAL | Status: DC | PRN

## 2012-02-14 ENCOUNTER — Ambulatory Visit: Payer: Medicare Other | Admitting: Family Medicine

## 2012-02-16 ENCOUNTER — Other Ambulatory Visit: Payer: Self-pay | Admitting: Family Medicine

## 2012-02-21 DIAGNOSIS — N186 End stage renal disease: Secondary | ICD-10-CM | POA: Diagnosis not present

## 2012-02-22 DIAGNOSIS — D509 Iron deficiency anemia, unspecified: Secondary | ICD-10-CM | POA: Diagnosis not present

## 2012-02-22 DIAGNOSIS — Z992 Dependence on renal dialysis: Secondary | ICD-10-CM | POA: Diagnosis not present

## 2012-02-22 DIAGNOSIS — D631 Anemia in chronic kidney disease: Secondary | ICD-10-CM | POA: Diagnosis not present

## 2012-02-22 DIAGNOSIS — N186 End stage renal disease: Secondary | ICD-10-CM | POA: Diagnosis not present

## 2012-02-22 DIAGNOSIS — N2581 Secondary hyperparathyroidism of renal origin: Secondary | ICD-10-CM | POA: Diagnosis not present

## 2012-03-02 DIAGNOSIS — N186 End stage renal disease: Secondary | ICD-10-CM | POA: Diagnosis not present

## 2012-03-16 DIAGNOSIS — N186 End stage renal disease: Secondary | ICD-10-CM | POA: Diagnosis not present

## 2012-03-17 ENCOUNTER — Encounter: Payer: Self-pay | Admitting: Family Medicine

## 2012-03-17 ENCOUNTER — Ambulatory Visit (INDEPENDENT_AMBULATORY_CARE_PROVIDER_SITE_OTHER): Payer: Medicare Other | Admitting: Family Medicine

## 2012-03-17 VITALS — BP 140/74 | HR 106 | Resp 18 | Ht 72.25 in | Wt 277.1 lb

## 2012-03-17 DIAGNOSIS — F17200 Nicotine dependence, unspecified, uncomplicated: Secondary | ICD-10-CM

## 2012-03-17 DIAGNOSIS — Z992 Dependence on renal dialysis: Secondary | ICD-10-CM

## 2012-03-17 DIAGNOSIS — J449 Chronic obstructive pulmonary disease, unspecified: Secondary | ICD-10-CM

## 2012-03-17 DIAGNOSIS — G4700 Insomnia, unspecified: Secondary | ICD-10-CM

## 2012-03-17 DIAGNOSIS — E669 Obesity, unspecified: Secondary | ICD-10-CM | POA: Diagnosis not present

## 2012-03-17 DIAGNOSIS — N186 End stage renal disease: Secondary | ICD-10-CM | POA: Diagnosis not present

## 2012-03-17 DIAGNOSIS — Z720 Tobacco use: Secondary | ICD-10-CM

## 2012-03-17 DIAGNOSIS — I10 Essential (primary) hypertension: Secondary | ICD-10-CM | POA: Diagnosis not present

## 2012-03-17 NOTE — Assessment & Plan Note (Signed)
Doing well, advised to use advair during winter for illness and albuterol as rescue inhaler

## 2012-03-17 NOTE — Assessment & Plan Note (Signed)
Doing well on restoril

## 2012-03-17 NOTE — Patient Instructions (Signed)
Restart advair if you feel a cold coming on or asthma acts up Use albuterol as rescue inhaler  Have dialysis draw your cholesterol level F/U 4 months

## 2012-03-17 NOTE — Assessment & Plan Note (Signed)
No change to meds, BP looks okay today

## 2012-03-17 NOTE — Assessment & Plan Note (Signed)
Unchanged, will have dialysis draw a FLP, this was not done a few months ago

## 2012-03-17 NOTE — Assessment & Plan Note (Signed)
Discussed cessation, he is contemplating, declines use of meds, patches, electronic cigarrettte

## 2012-03-17 NOTE — Progress Notes (Signed)
  Subjective:    Patient ID: Brian Barrera, male    DOB: 02-Dec-1970, 41 y.o.   MRN: [id number]  HPI Patient here to follow chronic medical problems. He has no specific concerns today. His fasting lipid panel was not obtained her last visit. He's been followed at [facility] as a possible transplant candidate. He was advised he needs to lose 20 pounds. He's doing well on his pain medication uses as needed. His sleeping medication is also helping he uses typically prior to dialysis otherwise does not need on a regular basis His breathing has been good. He is stop the Advair and has rarely used the albuterol. Flu shot was given at hemodialysis   Review of Systems  GEN- denies fatigue, fever, weight loss,weakness, recent illness HEENT- denies eye drainage, change in vision, nasal discharge, CVS- denies chest pain, palpitations RESP- denies SOB, cough, wheeze ABD- denies N/V, change in stools, abd pain GU- denies dysuria, hematuria, dribbling, incontinence MSK- + joint pain, muscle aches, injury Neuro- denies headache, dizziness, syncope, seizure activity      Objective:   Physical Exam GEN- NAD, alert and oriented x3, obese HEENT- PERRL , EOMI, oropharynx clear, MMM,  CVS- RRR, no murmur RESP-CTAB, no rhonchi, no crackles, normal WOB  EXT- No edema,  Pulses- Radial 2+ Graft Right forearm         Assessment & Plan:

## 2012-03-17 NOTE — Assessment & Plan Note (Signed)
He has been advised to lose 20lbs, discussed water aerobics, stationary biking

## 2012-03-20 ENCOUNTER — Telehealth: Payer: Self-pay | Admitting: Family Medicine

## 2012-03-20 DIAGNOSIS — I10 Essential (primary) hypertension: Secondary | ICD-10-CM

## 2012-03-20 DIAGNOSIS — E669 Obesity, unspecified: Secondary | ICD-10-CM

## 2012-03-20 NOTE — Telephone Encounter (Signed)
Labs printed, he needs to be fasting

## 2012-03-20 NOTE — Telephone Encounter (Signed)
Patient called stating that dialysis center will no longer draw labs and send results to pcp.  Are there any labs that you would like to order for him to do at [place].  Patient stated that he will go to [facility] sometime this week if any labs need to be completed.

## 2012-03-20 NOTE — Telephone Encounter (Signed)
Patient aware that he should be fasting.

## 2012-03-22 DIAGNOSIS — I10 Essential (primary) hypertension: Secondary | ICD-10-CM | POA: Diagnosis not present

## 2012-03-22 DIAGNOSIS — E669 Obesity, unspecified: Secondary | ICD-10-CM | POA: Diagnosis not present

## 2012-03-22 LAB — CBC
HCT: 35.4 % — ABNORMAL LOW (ref 39.0–52.0)
Hemoglobin: 11.7 g/dL — ABNORMAL LOW (ref 13.0–17.0)
MCH: 31.1 pg (ref 26.0–34.0)
MCHC: 33.1 g/dL (ref 30.0–36.0)
MCV: 94.1 fL (ref 78.0–100.0)
Platelets: 231 10*3/uL (ref 150–400)
RBC: 3.76 MIL/uL — ABNORMAL LOW (ref 4.22–5.81)
RDW: 13.9 % (ref 11.5–15.5)
WBC: 6.5 10*3/uL (ref 4.0–10.5)

## 2012-03-22 LAB — LIPID PANEL
Cholesterol: 137 mg/dL (ref 0–200)
HDL: 38 mg/dL — ABNORMAL LOW (ref >39)
LDL Cholesterol: 77 mg/dL (ref 0–99)
Total CHOL/HDL Ratio: 3.6 Ratio
Triglycerides: 109 mg/dL (ref <150)
VLDL: 22 mg/dL (ref 0–40)

## 2012-03-22 LAB — COMPREHENSIVE METABOLIC PANEL
ALT: 11 U/L (ref 0–53)
AST: 15 U/L (ref 0–37)
Albumin: 4.6 g/dL (ref 3.5–5.2)
Alkaline Phosphatase: 67 U/L (ref 39–117)
BUN: 31 mg/dL — ABNORMAL HIGH (ref 6–23)
CO2: 24 mEq/L (ref 19–32)
Calcium: 9.6 mg/dL (ref 8.4–10.5)
Chloride: 106 mEq/L (ref 96–112)
Creat: 9.76 mg/dL — ABNORMAL HIGH (ref 0.50–1.35)
Glucose, Bld: 84 mg/dL (ref 70–99)
Potassium: 4.4 mEq/L (ref 3.5–5.3)
Sodium: 142 mEq/L (ref 135–145)
Total Bilirubin: 0.5 mg/dL (ref 0.3–1.2)
Total Protein: 7.2 g/dL (ref 6.0–8.3)

## 2012-03-23 DIAGNOSIS — N186 End stage renal disease: Secondary | ICD-10-CM | POA: Diagnosis not present

## 2012-03-25 DIAGNOSIS — N2581 Secondary hyperparathyroidism of renal origin: Secondary | ICD-10-CM | POA: Diagnosis not present

## 2012-03-25 DIAGNOSIS — D631 Anemia in chronic kidney disease: Secondary | ICD-10-CM | POA: Diagnosis not present

## 2012-03-25 DIAGNOSIS — D509 Iron deficiency anemia, unspecified: Secondary | ICD-10-CM | POA: Diagnosis not present

## 2012-03-25 DIAGNOSIS — N186 End stage renal disease: Secondary | ICD-10-CM | POA: Diagnosis not present

## 2012-03-30 DIAGNOSIS — N186 End stage renal disease: Secondary | ICD-10-CM | POA: Diagnosis not present

## 2012-04-04 ENCOUNTER — Telehealth: Payer: Self-pay | Admitting: Family Medicine

## 2012-04-04 MED ORDER — HYDROCODONE-ACETAMINOPHEN 5-500 MG PO TABS: 1.0000 | ORAL_TABLET | Freq: Two times a day (BID) | ORAL | Status: DC | PRN

## 2012-04-04 NOTE — Telephone Encounter (Signed)
Med printed to be signed

## 2012-04-22 DIAGNOSIS — N186 End stage renal disease: Secondary | ICD-10-CM | POA: Diagnosis not present

## 2012-04-25 DIAGNOSIS — D509 Iron deficiency anemia, unspecified: Secondary | ICD-10-CM | POA: Diagnosis not present

## 2012-04-25 DIAGNOSIS — N186 End stage renal disease: Secondary | ICD-10-CM | POA: Diagnosis not present

## 2012-04-25 DIAGNOSIS — N2581 Secondary hyperparathyroidism of renal origin: Secondary | ICD-10-CM | POA: Diagnosis not present

## 2012-04-25 DIAGNOSIS — D631 Anemia in chronic kidney disease: Secondary | ICD-10-CM | POA: Diagnosis not present

## 2012-04-27 DIAGNOSIS — N186 End stage renal disease: Secondary | ICD-10-CM | POA: Diagnosis not present

## 2012-04-27 DIAGNOSIS — N2581 Secondary hyperparathyroidism of renal origin: Secondary | ICD-10-CM | POA: Diagnosis not present

## 2012-04-27 DIAGNOSIS — D509 Iron deficiency anemia, unspecified: Secondary | ICD-10-CM | POA: Diagnosis not present

## 2012-04-27 DIAGNOSIS — D631 Anemia in chronic kidney disease: Secondary | ICD-10-CM | POA: Diagnosis not present

## 2012-04-29 DIAGNOSIS — N039 Chronic nephritic syndrome with unspecified morphologic changes: Secondary | ICD-10-CM | POA: Diagnosis not present

## 2012-04-29 DIAGNOSIS — N186 End stage renal disease: Secondary | ICD-10-CM | POA: Diagnosis not present

## 2012-04-29 DIAGNOSIS — N2581 Secondary hyperparathyroidism of renal origin: Secondary | ICD-10-CM | POA: Diagnosis not present

## 2012-04-29 DIAGNOSIS — D509 Iron deficiency anemia, unspecified: Secondary | ICD-10-CM | POA: Diagnosis not present

## 2012-04-29 DIAGNOSIS — D631 Anemia in chronic kidney disease: Secondary | ICD-10-CM | POA: Diagnosis not present

## 2012-05-02 DIAGNOSIS — D631 Anemia in chronic kidney disease: Secondary | ICD-10-CM | POA: Diagnosis not present

## 2012-05-02 DIAGNOSIS — D509 Iron deficiency anemia, unspecified: Secondary | ICD-10-CM | POA: Diagnosis not present

## 2012-05-02 DIAGNOSIS — N039 Chronic nephritic syndrome with unspecified morphologic changes: Secondary | ICD-10-CM | POA: Diagnosis not present

## 2012-05-02 DIAGNOSIS — N2581 Secondary hyperparathyroidism of renal origin: Secondary | ICD-10-CM | POA: Diagnosis not present

## 2012-05-02 DIAGNOSIS — N186 End stage renal disease: Secondary | ICD-10-CM | POA: Diagnosis not present

## 2012-05-03 ENCOUNTER — Telehealth: Payer: Self-pay | Admitting: Family Medicine

## 2012-05-04 ENCOUNTER — Other Ambulatory Visit: Payer: Self-pay

## 2012-05-04 DIAGNOSIS — N186 End stage renal disease: Secondary | ICD-10-CM | POA: Diagnosis not present

## 2012-05-04 DIAGNOSIS — D509 Iron deficiency anemia, unspecified: Secondary | ICD-10-CM | POA: Diagnosis not present

## 2012-05-04 DIAGNOSIS — D631 Anemia in chronic kidney disease: Secondary | ICD-10-CM | POA: Diagnosis not present

## 2012-05-04 DIAGNOSIS — N2581 Secondary hyperparathyroidism of renal origin: Secondary | ICD-10-CM | POA: Diagnosis not present

## 2012-05-04 MED ORDER — HYDROCODONE-ACETAMINOPHEN 5-500 MG PO TABS: 1.0000 | ORAL_TABLET | Freq: Two times a day (BID) | ORAL | Status: DC | PRN

## 2012-05-04 NOTE — Telephone Encounter (Signed)
Noted and printed.

## 2012-05-06 DIAGNOSIS — N039 Chronic nephritic syndrome with unspecified morphologic changes: Secondary | ICD-10-CM | POA: Diagnosis not present

## 2012-05-06 DIAGNOSIS — N186 End stage renal disease: Secondary | ICD-10-CM | POA: Diagnosis not present

## 2012-05-06 DIAGNOSIS — N2581 Secondary hyperparathyroidism of renal origin: Secondary | ICD-10-CM | POA: Diagnosis not present

## 2012-05-06 DIAGNOSIS — D631 Anemia in chronic kidney disease: Secondary | ICD-10-CM | POA: Diagnosis not present

## 2012-05-06 DIAGNOSIS — D509 Iron deficiency anemia, unspecified: Secondary | ICD-10-CM | POA: Diagnosis not present

## 2012-05-09 DIAGNOSIS — N186 End stage renal disease: Secondary | ICD-10-CM | POA: Diagnosis not present

## 2012-05-09 DIAGNOSIS — N2581 Secondary hyperparathyroidism of renal origin: Secondary | ICD-10-CM | POA: Diagnosis not present

## 2012-05-09 DIAGNOSIS — D631 Anemia in chronic kidney disease: Secondary | ICD-10-CM | POA: Diagnosis not present

## 2012-05-09 DIAGNOSIS — D509 Iron deficiency anemia, unspecified: Secondary | ICD-10-CM | POA: Diagnosis not present

## 2012-05-11 DIAGNOSIS — D509 Iron deficiency anemia, unspecified: Secondary | ICD-10-CM | POA: Diagnosis not present

## 2012-05-11 DIAGNOSIS — D631 Anemia in chronic kidney disease: Secondary | ICD-10-CM | POA: Diagnosis not present

## 2012-05-11 DIAGNOSIS — N186 End stage renal disease: Secondary | ICD-10-CM | POA: Diagnosis not present

## 2012-05-11 DIAGNOSIS — N2581 Secondary hyperparathyroidism of renal origin: Secondary | ICD-10-CM | POA: Diagnosis not present

## 2012-05-13 DIAGNOSIS — N2581 Secondary hyperparathyroidism of renal origin: Secondary | ICD-10-CM | POA: Diagnosis not present

## 2012-05-13 DIAGNOSIS — N039 Chronic nephritic syndrome with unspecified morphologic changes: Secondary | ICD-10-CM | POA: Diagnosis not present

## 2012-05-13 DIAGNOSIS — D631 Anemia in chronic kidney disease: Secondary | ICD-10-CM | POA: Diagnosis not present

## 2012-05-13 DIAGNOSIS — N186 End stage renal disease: Secondary | ICD-10-CM | POA: Diagnosis not present

## 2012-05-13 DIAGNOSIS — D509 Iron deficiency anemia, unspecified: Secondary | ICD-10-CM | POA: Diagnosis not present

## 2012-05-15 DIAGNOSIS — D509 Iron deficiency anemia, unspecified: Secondary | ICD-10-CM | POA: Diagnosis not present

## 2012-05-15 DIAGNOSIS — N186 End stage renal disease: Secondary | ICD-10-CM | POA: Diagnosis not present

## 2012-05-15 DIAGNOSIS — N2581 Secondary hyperparathyroidism of renal origin: Secondary | ICD-10-CM | POA: Diagnosis not present

## 2012-05-15 DIAGNOSIS — N039 Chronic nephritic syndrome with unspecified morphologic changes: Secondary | ICD-10-CM | POA: Diagnosis not present

## 2012-05-15 DIAGNOSIS — D631 Anemia in chronic kidney disease: Secondary | ICD-10-CM | POA: Diagnosis not present

## 2012-05-18 DIAGNOSIS — N186 End stage renal disease: Secondary | ICD-10-CM | POA: Diagnosis not present

## 2012-05-18 DIAGNOSIS — N2581 Secondary hyperparathyroidism of renal origin: Secondary | ICD-10-CM | POA: Diagnosis not present

## 2012-05-18 DIAGNOSIS — N039 Chronic nephritic syndrome with unspecified morphologic changes: Secondary | ICD-10-CM | POA: Diagnosis not present

## 2012-05-18 DIAGNOSIS — D631 Anemia in chronic kidney disease: Secondary | ICD-10-CM | POA: Diagnosis not present

## 2012-05-18 DIAGNOSIS — D509 Iron deficiency anemia, unspecified: Secondary | ICD-10-CM | POA: Diagnosis not present

## 2012-05-20 DIAGNOSIS — D509 Iron deficiency anemia, unspecified: Secondary | ICD-10-CM | POA: Diagnosis not present

## 2012-05-20 DIAGNOSIS — D631 Anemia in chronic kidney disease: Secondary | ICD-10-CM | POA: Diagnosis not present

## 2012-05-20 DIAGNOSIS — N2581 Secondary hyperparathyroidism of renal origin: Secondary | ICD-10-CM | POA: Diagnosis not present

## 2012-05-20 DIAGNOSIS — N186 End stage renal disease: Secondary | ICD-10-CM | POA: Diagnosis not present

## 2012-05-22 DIAGNOSIS — D509 Iron deficiency anemia, unspecified: Secondary | ICD-10-CM | POA: Diagnosis not present

## 2012-05-22 DIAGNOSIS — D631 Anemia in chronic kidney disease: Secondary | ICD-10-CM | POA: Diagnosis not present

## 2012-05-22 DIAGNOSIS — N2581 Secondary hyperparathyroidism of renal origin: Secondary | ICD-10-CM | POA: Diagnosis not present

## 2012-05-22 DIAGNOSIS — N039 Chronic nephritic syndrome with unspecified morphologic changes: Secondary | ICD-10-CM | POA: Diagnosis not present

## 2012-05-22 DIAGNOSIS — N186 End stage renal disease: Secondary | ICD-10-CM | POA: Diagnosis not present

## 2012-05-23 DIAGNOSIS — N186 End stage renal disease: Secondary | ICD-10-CM | POA: Diagnosis not present

## 2012-05-25 DIAGNOSIS — N186 End stage renal disease: Secondary | ICD-10-CM | POA: Diagnosis not present

## 2012-05-25 DIAGNOSIS — N2581 Secondary hyperparathyroidism of renal origin: Secondary | ICD-10-CM | POA: Diagnosis not present

## 2012-05-25 DIAGNOSIS — D631 Anemia in chronic kidney disease: Secondary | ICD-10-CM | POA: Diagnosis not present

## 2012-05-25 DIAGNOSIS — D509 Iron deficiency anemia, unspecified: Secondary | ICD-10-CM | POA: Diagnosis not present

## 2012-05-26 ENCOUNTER — Other Ambulatory Visit: Payer: Self-pay

## 2012-05-26 DIAGNOSIS — T82898A Other specified complication of vascular prosthetic devices, implants and grafts, initial encounter: Secondary | ICD-10-CM | POA: Diagnosis not present

## 2012-05-26 DIAGNOSIS — N186 End stage renal disease: Secondary | ICD-10-CM | POA: Diagnosis not present

## 2012-05-26 MED ORDER — HYDROCODONE-ACETAMINOPHEN 5-500 MG PO TABS: 1.0000 | ORAL_TABLET | Freq: Two times a day (BID) | ORAL | Status: DC | PRN

## 2012-05-27 DIAGNOSIS — D631 Anemia in chronic kidney disease: Secondary | ICD-10-CM | POA: Diagnosis not present

## 2012-05-27 DIAGNOSIS — D509 Iron deficiency anemia, unspecified: Secondary | ICD-10-CM | POA: Diagnosis not present

## 2012-05-27 DIAGNOSIS — N186 End stage renal disease: Secondary | ICD-10-CM | POA: Diagnosis not present

## 2012-05-27 DIAGNOSIS — N2581 Secondary hyperparathyroidism of renal origin: Secondary | ICD-10-CM | POA: Diagnosis not present

## 2012-05-30 DIAGNOSIS — N2581 Secondary hyperparathyroidism of renal origin: Secondary | ICD-10-CM | POA: Diagnosis not present

## 2012-05-30 DIAGNOSIS — D631 Anemia in chronic kidney disease: Secondary | ICD-10-CM | POA: Diagnosis not present

## 2012-05-30 DIAGNOSIS — N039 Chronic nephritic syndrome with unspecified morphologic changes: Secondary | ICD-10-CM | POA: Diagnosis not present

## 2012-05-30 DIAGNOSIS — D509 Iron deficiency anemia, unspecified: Secondary | ICD-10-CM | POA: Diagnosis not present

## 2012-05-30 DIAGNOSIS — N186 End stage renal disease: Secondary | ICD-10-CM | POA: Diagnosis not present

## 2012-06-01 DIAGNOSIS — N186 End stage renal disease: Secondary | ICD-10-CM | POA: Diagnosis not present

## 2012-06-01 DIAGNOSIS — D509 Iron deficiency anemia, unspecified: Secondary | ICD-10-CM | POA: Diagnosis not present

## 2012-06-01 DIAGNOSIS — D631 Anemia in chronic kidney disease: Secondary | ICD-10-CM | POA: Diagnosis not present

## 2012-06-01 DIAGNOSIS — N2581 Secondary hyperparathyroidism of renal origin: Secondary | ICD-10-CM | POA: Diagnosis not present

## 2012-06-03 DIAGNOSIS — D631 Anemia in chronic kidney disease: Secondary | ICD-10-CM | POA: Diagnosis not present

## 2012-06-03 DIAGNOSIS — N039 Chronic nephritic syndrome with unspecified morphologic changes: Secondary | ICD-10-CM | POA: Diagnosis not present

## 2012-06-03 DIAGNOSIS — D509 Iron deficiency anemia, unspecified: Secondary | ICD-10-CM | POA: Diagnosis not present

## 2012-06-03 DIAGNOSIS — N2581 Secondary hyperparathyroidism of renal origin: Secondary | ICD-10-CM | POA: Diagnosis not present

## 2012-06-03 DIAGNOSIS — N186 End stage renal disease: Secondary | ICD-10-CM | POA: Diagnosis not present

## 2012-06-06 DIAGNOSIS — N2581 Secondary hyperparathyroidism of renal origin: Secondary | ICD-10-CM | POA: Diagnosis not present

## 2012-06-06 DIAGNOSIS — N039 Chronic nephritic syndrome with unspecified morphologic changes: Secondary | ICD-10-CM | POA: Diagnosis not present

## 2012-06-06 DIAGNOSIS — N186 End stage renal disease: Secondary | ICD-10-CM | POA: Diagnosis not present

## 2012-06-06 DIAGNOSIS — D631 Anemia in chronic kidney disease: Secondary | ICD-10-CM | POA: Diagnosis not present

## 2012-06-06 DIAGNOSIS — D509 Iron deficiency anemia, unspecified: Secondary | ICD-10-CM | POA: Diagnosis not present

## 2012-06-08 DIAGNOSIS — D509 Iron deficiency anemia, unspecified: Secondary | ICD-10-CM | POA: Diagnosis not present

## 2012-06-08 DIAGNOSIS — D631 Anemia in chronic kidney disease: Secondary | ICD-10-CM | POA: Diagnosis not present

## 2012-06-08 DIAGNOSIS — N2581 Secondary hyperparathyroidism of renal origin: Secondary | ICD-10-CM | POA: Diagnosis not present

## 2012-06-08 DIAGNOSIS — N186 End stage renal disease: Secondary | ICD-10-CM | POA: Diagnosis not present

## 2012-06-10 DIAGNOSIS — D631 Anemia in chronic kidney disease: Secondary | ICD-10-CM | POA: Diagnosis not present

## 2012-06-10 DIAGNOSIS — N039 Chronic nephritic syndrome with unspecified morphologic changes: Secondary | ICD-10-CM | POA: Diagnosis not present

## 2012-06-10 DIAGNOSIS — N186 End stage renal disease: Secondary | ICD-10-CM | POA: Diagnosis not present

## 2012-06-10 DIAGNOSIS — N2581 Secondary hyperparathyroidism of renal origin: Secondary | ICD-10-CM | POA: Diagnosis not present

## 2012-06-10 DIAGNOSIS — D509 Iron deficiency anemia, unspecified: Secondary | ICD-10-CM | POA: Diagnosis not present

## 2012-06-13 DIAGNOSIS — N2581 Secondary hyperparathyroidism of renal origin: Secondary | ICD-10-CM | POA: Diagnosis not present

## 2012-06-13 DIAGNOSIS — N186 End stage renal disease: Secondary | ICD-10-CM | POA: Diagnosis not present

## 2012-06-13 DIAGNOSIS — D631 Anemia in chronic kidney disease: Secondary | ICD-10-CM | POA: Diagnosis not present

## 2012-06-13 DIAGNOSIS — D509 Iron deficiency anemia, unspecified: Secondary | ICD-10-CM | POA: Diagnosis not present

## 2012-06-15 ENCOUNTER — Encounter: Payer: Self-pay | Admitting: Family Medicine

## 2012-06-15 DIAGNOSIS — M25519 Pain in unspecified shoulder: Secondary | ICD-10-CM

## 2012-06-15 DIAGNOSIS — D631 Anemia in chronic kidney disease: Secondary | ICD-10-CM | POA: Diagnosis not present

## 2012-06-15 DIAGNOSIS — M179 Osteoarthritis of knee, unspecified: Secondary | ICD-10-CM | POA: Insufficient documentation

## 2012-06-15 DIAGNOSIS — N186 End stage renal disease: Secondary | ICD-10-CM | POA: Diagnosis not present

## 2012-06-15 DIAGNOSIS — D509 Iron deficiency anemia, unspecified: Secondary | ICD-10-CM | POA: Diagnosis not present

## 2012-06-15 DIAGNOSIS — M1710 Unilateral primary osteoarthritis, unspecified knee: Secondary | ICD-10-CM | POA: Insufficient documentation

## 2012-06-15 DIAGNOSIS — G8929 Other chronic pain: Secondary | ICD-10-CM | POA: Insufficient documentation

## 2012-06-15 DIAGNOSIS — N2581 Secondary hyperparathyroidism of renal origin: Secondary | ICD-10-CM | POA: Diagnosis not present

## 2012-06-17 DIAGNOSIS — N2581 Secondary hyperparathyroidism of renal origin: Secondary | ICD-10-CM | POA: Diagnosis not present

## 2012-06-17 DIAGNOSIS — N186 End stage renal disease: Secondary | ICD-10-CM | POA: Diagnosis not present

## 2012-06-17 DIAGNOSIS — D631 Anemia in chronic kidney disease: Secondary | ICD-10-CM | POA: Diagnosis not present

## 2012-06-17 DIAGNOSIS — D509 Iron deficiency anemia, unspecified: Secondary | ICD-10-CM | POA: Diagnosis not present

## 2012-06-20 DIAGNOSIS — D631 Anemia in chronic kidney disease: Secondary | ICD-10-CM | POA: Diagnosis not present

## 2012-06-20 DIAGNOSIS — D509 Iron deficiency anemia, unspecified: Secondary | ICD-10-CM | POA: Diagnosis not present

## 2012-06-20 DIAGNOSIS — N186 End stage renal disease: Secondary | ICD-10-CM | POA: Diagnosis not present

## 2012-06-20 DIAGNOSIS — N2581 Secondary hyperparathyroidism of renal origin: Secondary | ICD-10-CM | POA: Diagnosis not present

## 2012-06-22 DIAGNOSIS — N186 End stage renal disease: Secondary | ICD-10-CM | POA: Diagnosis not present

## 2012-06-22 DIAGNOSIS — N039 Chronic nephritic syndrome with unspecified morphologic changes: Secondary | ICD-10-CM | POA: Diagnosis not present

## 2012-06-22 DIAGNOSIS — D631 Anemia in chronic kidney disease: Secondary | ICD-10-CM | POA: Diagnosis not present

## 2012-06-22 DIAGNOSIS — D509 Iron deficiency anemia, unspecified: Secondary | ICD-10-CM | POA: Diagnosis not present

## 2012-06-22 DIAGNOSIS — N2581 Secondary hyperparathyroidism of renal origin: Secondary | ICD-10-CM | POA: Diagnosis not present

## 2012-06-23 DIAGNOSIS — N186 End stage renal disease: Secondary | ICD-10-CM | POA: Diagnosis not present

## 2012-06-24 DIAGNOSIS — N186 End stage renal disease: Secondary | ICD-10-CM | POA: Diagnosis not present

## 2012-06-24 DIAGNOSIS — N039 Chronic nephritic syndrome with unspecified morphologic changes: Secondary | ICD-10-CM | POA: Diagnosis not present

## 2012-06-24 DIAGNOSIS — D631 Anemia in chronic kidney disease: Secondary | ICD-10-CM | POA: Diagnosis not present

## 2012-06-24 DIAGNOSIS — D509 Iron deficiency anemia, unspecified: Secondary | ICD-10-CM | POA: Diagnosis not present

## 2012-06-24 DIAGNOSIS — N2581 Secondary hyperparathyroidism of renal origin: Secondary | ICD-10-CM | POA: Diagnosis not present

## 2012-06-27 DIAGNOSIS — D509 Iron deficiency anemia, unspecified: Secondary | ICD-10-CM | POA: Diagnosis not present

## 2012-06-27 DIAGNOSIS — D631 Anemia in chronic kidney disease: Secondary | ICD-10-CM | POA: Diagnosis not present

## 2012-06-27 DIAGNOSIS — N186 End stage renal disease: Secondary | ICD-10-CM | POA: Diagnosis not present

## 2012-06-27 DIAGNOSIS — N2581 Secondary hyperparathyroidism of renal origin: Secondary | ICD-10-CM | POA: Diagnosis not present

## 2012-06-29 DIAGNOSIS — D509 Iron deficiency anemia, unspecified: Secondary | ICD-10-CM | POA: Diagnosis not present

## 2012-06-29 DIAGNOSIS — N039 Chronic nephritic syndrome with unspecified morphologic changes: Secondary | ICD-10-CM | POA: Diagnosis not present

## 2012-06-29 DIAGNOSIS — D631 Anemia in chronic kidney disease: Secondary | ICD-10-CM | POA: Diagnosis not present

## 2012-06-29 DIAGNOSIS — N2581 Secondary hyperparathyroidism of renal origin: Secondary | ICD-10-CM | POA: Diagnosis not present

## 2012-06-29 DIAGNOSIS — N186 End stage renal disease: Secondary | ICD-10-CM | POA: Diagnosis not present

## 2012-07-01 DIAGNOSIS — D631 Anemia in chronic kidney disease: Secondary | ICD-10-CM | POA: Diagnosis not present

## 2012-07-01 DIAGNOSIS — N039 Chronic nephritic syndrome with unspecified morphologic changes: Secondary | ICD-10-CM | POA: Diagnosis not present

## 2012-07-01 DIAGNOSIS — N2581 Secondary hyperparathyroidism of renal origin: Secondary | ICD-10-CM | POA: Diagnosis not present

## 2012-07-01 DIAGNOSIS — D509 Iron deficiency anemia, unspecified: Secondary | ICD-10-CM | POA: Diagnosis not present

## 2012-07-01 DIAGNOSIS — N186 End stage renal disease: Secondary | ICD-10-CM | POA: Diagnosis not present

## 2012-07-04 DIAGNOSIS — D631 Anemia in chronic kidney disease: Secondary | ICD-10-CM | POA: Diagnosis not present

## 2012-07-04 DIAGNOSIS — N186 End stage renal disease: Secondary | ICD-10-CM | POA: Diagnosis not present

## 2012-07-04 DIAGNOSIS — D509 Iron deficiency anemia, unspecified: Secondary | ICD-10-CM | POA: Diagnosis not present

## 2012-07-04 DIAGNOSIS — N2581 Secondary hyperparathyroidism of renal origin: Secondary | ICD-10-CM | POA: Diagnosis not present

## 2012-07-07 DIAGNOSIS — D509 Iron deficiency anemia, unspecified: Secondary | ICD-10-CM | POA: Diagnosis not present

## 2012-07-07 DIAGNOSIS — N186 End stage renal disease: Secondary | ICD-10-CM | POA: Diagnosis not present

## 2012-07-07 DIAGNOSIS — D631 Anemia in chronic kidney disease: Secondary | ICD-10-CM | POA: Diagnosis not present

## 2012-07-07 DIAGNOSIS — N2581 Secondary hyperparathyroidism of renal origin: Secondary | ICD-10-CM | POA: Diagnosis not present

## 2012-07-09 DIAGNOSIS — D631 Anemia in chronic kidney disease: Secondary | ICD-10-CM | POA: Diagnosis not present

## 2012-07-09 DIAGNOSIS — N186 End stage renal disease: Secondary | ICD-10-CM | POA: Diagnosis not present

## 2012-07-09 DIAGNOSIS — N2581 Secondary hyperparathyroidism of renal origin: Secondary | ICD-10-CM | POA: Diagnosis not present

## 2012-07-09 DIAGNOSIS — D509 Iron deficiency anemia, unspecified: Secondary | ICD-10-CM | POA: Diagnosis not present

## 2012-07-09 DIAGNOSIS — N039 Chronic nephritic syndrome with unspecified morphologic changes: Secondary | ICD-10-CM | POA: Diagnosis not present

## 2012-07-11 DIAGNOSIS — N039 Chronic nephritic syndrome with unspecified morphologic changes: Secondary | ICD-10-CM | POA: Diagnosis not present

## 2012-07-11 DIAGNOSIS — N186 End stage renal disease: Secondary | ICD-10-CM | POA: Diagnosis not present

## 2012-07-11 DIAGNOSIS — N2581 Secondary hyperparathyroidism of renal origin: Secondary | ICD-10-CM | POA: Diagnosis not present

## 2012-07-11 DIAGNOSIS — D631 Anemia in chronic kidney disease: Secondary | ICD-10-CM | POA: Diagnosis not present

## 2012-07-11 DIAGNOSIS — D509 Iron deficiency anemia, unspecified: Secondary | ICD-10-CM | POA: Diagnosis not present

## 2012-07-13 DIAGNOSIS — D631 Anemia in chronic kidney disease: Secondary | ICD-10-CM | POA: Diagnosis not present

## 2012-07-13 DIAGNOSIS — N186 End stage renal disease: Secondary | ICD-10-CM | POA: Diagnosis not present

## 2012-07-13 DIAGNOSIS — N039 Chronic nephritic syndrome with unspecified morphologic changes: Secondary | ICD-10-CM | POA: Diagnosis not present

## 2012-07-13 DIAGNOSIS — N2581 Secondary hyperparathyroidism of renal origin: Secondary | ICD-10-CM | POA: Diagnosis not present

## 2012-07-13 DIAGNOSIS — D509 Iron deficiency anemia, unspecified: Secondary | ICD-10-CM | POA: Diagnosis not present

## 2012-07-15 DIAGNOSIS — N039 Chronic nephritic syndrome with unspecified morphologic changes: Secondary | ICD-10-CM | POA: Diagnosis not present

## 2012-07-15 DIAGNOSIS — N2581 Secondary hyperparathyroidism of renal origin: Secondary | ICD-10-CM | POA: Diagnosis not present

## 2012-07-15 DIAGNOSIS — D509 Iron deficiency anemia, unspecified: Secondary | ICD-10-CM | POA: Diagnosis not present

## 2012-07-15 DIAGNOSIS — D631 Anemia in chronic kidney disease: Secondary | ICD-10-CM | POA: Diagnosis not present

## 2012-07-15 DIAGNOSIS — N186 End stage renal disease: Secondary | ICD-10-CM | POA: Diagnosis not present

## 2012-07-18 DIAGNOSIS — N039 Chronic nephritic syndrome with unspecified morphologic changes: Secondary | ICD-10-CM | POA: Diagnosis not present

## 2012-07-18 DIAGNOSIS — D631 Anemia in chronic kidney disease: Secondary | ICD-10-CM | POA: Diagnosis not present

## 2012-07-18 DIAGNOSIS — D509 Iron deficiency anemia, unspecified: Secondary | ICD-10-CM | POA: Diagnosis not present

## 2012-07-18 DIAGNOSIS — N186 End stage renal disease: Secondary | ICD-10-CM | POA: Diagnosis not present

## 2012-07-18 DIAGNOSIS — N2581 Secondary hyperparathyroidism of renal origin: Secondary | ICD-10-CM | POA: Diagnosis not present

## 2012-07-20 ENCOUNTER — Telehealth: Payer: Self-pay | Admitting: Family Medicine

## 2012-07-20 ENCOUNTER — Other Ambulatory Visit: Payer: Self-pay

## 2012-07-20 DIAGNOSIS — N2581 Secondary hyperparathyroidism of renal origin: Secondary | ICD-10-CM | POA: Diagnosis not present

## 2012-07-20 DIAGNOSIS — D631 Anemia in chronic kidney disease: Secondary | ICD-10-CM | POA: Diagnosis not present

## 2012-07-20 DIAGNOSIS — D509 Iron deficiency anemia, unspecified: Secondary | ICD-10-CM | POA: Diagnosis not present

## 2012-07-20 DIAGNOSIS — N186 End stage renal disease: Secondary | ICD-10-CM | POA: Diagnosis not present

## 2012-07-20 MED ORDER — HYDROCODONE-ACETAMINOPHEN 5-500 MG PO TABS: 1.0000 | ORAL_TABLET | Freq: Two times a day (BID) | ORAL | Status: DC | PRN

## 2012-07-20 MED ORDER — HYDROCODONE-ACETAMINOPHEN 5-500 MG PO TABS
1.0000 | ORAL_TABLET | Freq: Two times a day (BID) | ORAL | Status: DC | PRN
Start: 2012-07-20 — End: 2012-08-17

## 2012-07-20 NOTE — Telephone Encounter (Signed)
Med refilled and ready for collection

## 2012-07-21 DIAGNOSIS — N186 End stage renal disease: Secondary | ICD-10-CM | POA: Diagnosis not present

## 2012-07-22 DIAGNOSIS — D631 Anemia in chronic kidney disease: Secondary | ICD-10-CM | POA: Diagnosis not present

## 2012-07-22 DIAGNOSIS — N2581 Secondary hyperparathyroidism of renal origin: Secondary | ICD-10-CM | POA: Diagnosis not present

## 2012-07-22 DIAGNOSIS — N186 End stage renal disease: Secondary | ICD-10-CM | POA: Diagnosis not present

## 2012-07-22 DIAGNOSIS — D509 Iron deficiency anemia, unspecified: Secondary | ICD-10-CM | POA: Diagnosis not present

## 2012-07-22 DIAGNOSIS — N039 Chronic nephritic syndrome with unspecified morphologic changes: Secondary | ICD-10-CM | POA: Diagnosis not present

## 2012-07-25 DIAGNOSIS — N039 Chronic nephritic syndrome with unspecified morphologic changes: Secondary | ICD-10-CM | POA: Diagnosis not present

## 2012-07-25 DIAGNOSIS — D631 Anemia in chronic kidney disease: Secondary | ICD-10-CM | POA: Diagnosis not present

## 2012-07-25 DIAGNOSIS — N186 End stage renal disease: Secondary | ICD-10-CM | POA: Diagnosis not present

## 2012-07-25 DIAGNOSIS — D509 Iron deficiency anemia, unspecified: Secondary | ICD-10-CM | POA: Diagnosis not present

## 2012-07-25 DIAGNOSIS — N2581 Secondary hyperparathyroidism of renal origin: Secondary | ICD-10-CM | POA: Diagnosis not present

## 2012-07-27 DIAGNOSIS — D509 Iron deficiency anemia, unspecified: Secondary | ICD-10-CM | POA: Diagnosis not present

## 2012-07-27 DIAGNOSIS — D631 Anemia in chronic kidney disease: Secondary | ICD-10-CM | POA: Diagnosis not present

## 2012-07-27 DIAGNOSIS — N186 End stage renal disease: Secondary | ICD-10-CM | POA: Diagnosis not present

## 2012-07-27 DIAGNOSIS — N2581 Secondary hyperparathyroidism of renal origin: Secondary | ICD-10-CM | POA: Diagnosis not present

## 2012-07-29 DIAGNOSIS — N039 Chronic nephritic syndrome with unspecified morphologic changes: Secondary | ICD-10-CM | POA: Diagnosis not present

## 2012-07-29 DIAGNOSIS — N186 End stage renal disease: Secondary | ICD-10-CM | POA: Diagnosis not present

## 2012-07-29 DIAGNOSIS — D509 Iron deficiency anemia, unspecified: Secondary | ICD-10-CM | POA: Diagnosis not present

## 2012-07-29 DIAGNOSIS — N2581 Secondary hyperparathyroidism of renal origin: Secondary | ICD-10-CM | POA: Diagnosis not present

## 2012-07-29 DIAGNOSIS — D631 Anemia in chronic kidney disease: Secondary | ICD-10-CM | POA: Diagnosis not present

## 2012-07-31 ENCOUNTER — Encounter: Payer: Self-pay | Admitting: Family Medicine

## 2012-07-31 ENCOUNTER — Ambulatory Visit (INDEPENDENT_AMBULATORY_CARE_PROVIDER_SITE_OTHER): Payer: Medicare Other | Admitting: Family Medicine

## 2012-07-31 VITALS — BP 130/80 | HR 92 | Resp 16 | Ht 72.25 in | Wt 277.8 lb

## 2012-07-31 DIAGNOSIS — IMO0002 Reserved for concepts with insufficient information to code with codable children: Secondary | ICD-10-CM | POA: Diagnosis not present

## 2012-07-31 DIAGNOSIS — J449 Chronic obstructive pulmonary disease, unspecified: Secondary | ICD-10-CM

## 2012-07-31 DIAGNOSIS — M179 Osteoarthritis of knee, unspecified: Secondary | ICD-10-CM

## 2012-07-31 DIAGNOSIS — M1710 Unilateral primary osteoarthritis, unspecified knee: Secondary | ICD-10-CM

## 2012-07-31 DIAGNOSIS — I10 Essential (primary) hypertension: Secondary | ICD-10-CM | POA: Diagnosis not present

## 2012-07-31 DIAGNOSIS — Z79899 Other long term (current) drug therapy: Secondary | ICD-10-CM

## 2012-07-31 DIAGNOSIS — F17200 Nicotine dependence, unspecified, uncomplicated: Secondary | ICD-10-CM

## 2012-07-31 DIAGNOSIS — E669 Obesity, unspecified: Secondary | ICD-10-CM

## 2012-07-31 DIAGNOSIS — G4700 Insomnia, unspecified: Secondary | ICD-10-CM

## 2012-07-31 DIAGNOSIS — Z720 Tobacco use: Secondary | ICD-10-CM

## 2012-07-31 MED ORDER — TEMAZEPAM 30 MG PO CAPS: ORAL_CAPSULE | ORAL | Status: DC

## 2012-07-31 MED ORDER — FLUTICASONE-SALMETEROL 250-50 MCG/DOSE IN AEPB: 1.0000 | INHALATION_SPRAY | Freq: Two times a day (BID) | RESPIRATORY_TRACT | Status: DC

## 2012-07-31 MED ORDER — ALBUTEROL SULFATE HFA 108 (90 BASE) MCG/ACT IN AERS: 2.0000 | INHALATION_SPRAY | RESPIRATORY_TRACT | Status: DC | PRN

## 2012-07-31 NOTE — Progress Notes (Signed)
  Subjective:    Patient ID: Brian Barrera, male    DOB: July 12, 1970, 42 y.o.   MRN: [id number]  HPI  Patient here to follow chronic medical problems. No specific concerns. He continues to use his pain medications, take at times he runs out early this he will take 3 a day. He is currently on dialysis they have been no changes He does not think that his sleeping medication is working as he often wakes up and is unable to go back to sleep after about 3 or 4 hours Flu shot given at dialysis  Review of Systems  GEN- denies fatigue, fever, weight loss,weakness, recent illness HEENT- denies eye drainage, change in vision, nasal discharge, CVS- denies chest pain, palpitations RESP- denies SOB, cough, wheeze ABD- denies N/V, change in stools, abd pain GU- denies dysuria, hematuria, dribbling, incontinence MSK- + joint pain, muscle aches, injury Neuro- denies headache, dizziness, syncope, seizure activity      Objective:   Physical Exam GEN- NAD, alert and oriented x3 HEENT- PERRL, EOMI, non injected sclera, pink conjunctiva, MMM, oropharynx clear Neck- Supple, CVS- RRR, no murmur RESP-CTAB EXT- No edema Pulses- Radial, DP- 2+        Assessment & Plan:

## 2012-07-31 NOTE — Patient Instructions (Addendum)
Increase sleeping pill to 30mg  at bedtime Drug screen to be done at lab We will call about the albuterol Restart the advair F/U 4 months

## 2012-08-01 DIAGNOSIS — N2581 Secondary hyperparathyroidism of renal origin: Secondary | ICD-10-CM | POA: Diagnosis not present

## 2012-08-01 DIAGNOSIS — N186 End stage renal disease: Secondary | ICD-10-CM | POA: Diagnosis not present

## 2012-08-01 DIAGNOSIS — D631 Anemia in chronic kidney disease: Secondary | ICD-10-CM | POA: Diagnosis not present

## 2012-08-01 DIAGNOSIS — D509 Iron deficiency anemia, unspecified: Secondary | ICD-10-CM | POA: Diagnosis not present

## 2012-08-01 DIAGNOSIS — N039 Chronic nephritic syndrome with unspecified morphologic changes: Secondary | ICD-10-CM | POA: Diagnosis not present

## 2012-08-02 ENCOUNTER — Encounter: Payer: Self-pay | Admitting: Family Medicine

## 2012-08-02 NOTE — Assessment & Plan Note (Signed)
Discussed importance of meds, restart advair

## 2012-08-02 NOTE — Assessment & Plan Note (Signed)
On chronic pain meds for this and shoulder , serum drug screen done

## 2012-08-02 NOTE — Assessment & Plan Note (Signed)
Continues to smoke tobacco cessation counseling done, he wishes to quit cold [place]

## 2012-08-02 NOTE — Assessment & Plan Note (Signed)
Discussed weight gain and need for exercise, weight loss

## 2012-08-02 NOTE — Assessment & Plan Note (Signed)
BP has done well on current meds

## 2012-08-02 NOTE — Assessment & Plan Note (Signed)
Increase restoril to 30mg 

## 2012-08-03 DIAGNOSIS — D631 Anemia in chronic kidney disease: Secondary | ICD-10-CM | POA: Diagnosis not present

## 2012-08-03 DIAGNOSIS — D509 Iron deficiency anemia, unspecified: Secondary | ICD-10-CM | POA: Diagnosis not present

## 2012-08-03 DIAGNOSIS — N2581 Secondary hyperparathyroidism of renal origin: Secondary | ICD-10-CM | POA: Diagnosis not present

## 2012-08-03 DIAGNOSIS — N039 Chronic nephritic syndrome with unspecified morphologic changes: Secondary | ICD-10-CM | POA: Diagnosis not present

## 2012-08-03 DIAGNOSIS — N186 End stage renal disease: Secondary | ICD-10-CM | POA: Diagnosis not present

## 2012-08-04 ENCOUNTER — Ambulatory Visit: Payer: Medicare Other | Admitting: Family Medicine

## 2012-08-05 DIAGNOSIS — D631 Anemia in chronic kidney disease: Secondary | ICD-10-CM | POA: Diagnosis not present

## 2012-08-05 DIAGNOSIS — N2581 Secondary hyperparathyroidism of renal origin: Secondary | ICD-10-CM | POA: Diagnosis not present

## 2012-08-05 DIAGNOSIS — N039 Chronic nephritic syndrome with unspecified morphologic changes: Secondary | ICD-10-CM | POA: Diagnosis not present

## 2012-08-05 DIAGNOSIS — D509 Iron deficiency anemia, unspecified: Secondary | ICD-10-CM | POA: Diagnosis not present

## 2012-08-05 DIAGNOSIS — N186 End stage renal disease: Secondary | ICD-10-CM | POA: Diagnosis not present

## 2012-08-08 DIAGNOSIS — D509 Iron deficiency anemia, unspecified: Secondary | ICD-10-CM | POA: Diagnosis not present

## 2012-08-08 DIAGNOSIS — D631 Anemia in chronic kidney disease: Secondary | ICD-10-CM | POA: Diagnosis not present

## 2012-08-08 DIAGNOSIS — N039 Chronic nephritic syndrome with unspecified morphologic changes: Secondary | ICD-10-CM | POA: Diagnosis not present

## 2012-08-08 DIAGNOSIS — N2581 Secondary hyperparathyroidism of renal origin: Secondary | ICD-10-CM | POA: Diagnosis not present

## 2012-08-08 DIAGNOSIS — N186 End stage renal disease: Secondary | ICD-10-CM | POA: Diagnosis not present

## 2012-08-08 LAB — DRUG SCREEN PANEL (SERUM)

## 2012-08-10 DIAGNOSIS — D509 Iron deficiency anemia, unspecified: Secondary | ICD-10-CM | POA: Diagnosis not present

## 2012-08-10 DIAGNOSIS — D631 Anemia in chronic kidney disease: Secondary | ICD-10-CM | POA: Diagnosis not present

## 2012-08-10 DIAGNOSIS — N2581 Secondary hyperparathyroidism of renal origin: Secondary | ICD-10-CM | POA: Diagnosis not present

## 2012-08-10 DIAGNOSIS — N186 End stage renal disease: Secondary | ICD-10-CM | POA: Diagnosis not present

## 2012-08-10 DIAGNOSIS — N039 Chronic nephritic syndrome with unspecified morphologic changes: Secondary | ICD-10-CM | POA: Diagnosis not present

## 2012-08-12 DIAGNOSIS — N2581 Secondary hyperparathyroidism of renal origin: Secondary | ICD-10-CM | POA: Diagnosis not present

## 2012-08-12 DIAGNOSIS — D631 Anemia in chronic kidney disease: Secondary | ICD-10-CM | POA: Diagnosis not present

## 2012-08-12 DIAGNOSIS — N186 End stage renal disease: Secondary | ICD-10-CM | POA: Diagnosis not present

## 2012-08-12 DIAGNOSIS — N039 Chronic nephritic syndrome with unspecified morphologic changes: Secondary | ICD-10-CM | POA: Diagnosis not present

## 2012-08-12 DIAGNOSIS — D509 Iron deficiency anemia, unspecified: Secondary | ICD-10-CM | POA: Diagnosis not present

## 2012-08-15 DIAGNOSIS — D631 Anemia in chronic kidney disease: Secondary | ICD-10-CM | POA: Diagnosis not present

## 2012-08-15 DIAGNOSIS — D509 Iron deficiency anemia, unspecified: Secondary | ICD-10-CM | POA: Diagnosis not present

## 2012-08-15 DIAGNOSIS — N2581 Secondary hyperparathyroidism of renal origin: Secondary | ICD-10-CM | POA: Diagnosis not present

## 2012-08-15 DIAGNOSIS — N186 End stage renal disease: Secondary | ICD-10-CM | POA: Diagnosis not present

## 2012-08-17 ENCOUNTER — Other Ambulatory Visit: Payer: Self-pay

## 2012-08-17 DIAGNOSIS — D631 Anemia in chronic kidney disease: Secondary | ICD-10-CM | POA: Diagnosis not present

## 2012-08-17 DIAGNOSIS — D509 Iron deficiency anemia, unspecified: Secondary | ICD-10-CM | POA: Diagnosis not present

## 2012-08-17 DIAGNOSIS — N2581 Secondary hyperparathyroidism of renal origin: Secondary | ICD-10-CM | POA: Diagnosis not present

## 2012-08-17 DIAGNOSIS — N186 End stage renal disease: Secondary | ICD-10-CM | POA: Diagnosis not present

## 2012-08-17 MED ORDER — HYDROCODONE-ACETAMINOPHEN 5-325 MG PO TABS: 1.0000 | ORAL_TABLET | Freq: Two times a day (BID) | ORAL | Status: DC | PRN

## 2012-08-19 DIAGNOSIS — N186 End stage renal disease: Secondary | ICD-10-CM | POA: Diagnosis not present

## 2012-08-19 DIAGNOSIS — N2581 Secondary hyperparathyroidism of renal origin: Secondary | ICD-10-CM | POA: Diagnosis not present

## 2012-08-19 DIAGNOSIS — D509 Iron deficiency anemia, unspecified: Secondary | ICD-10-CM | POA: Diagnosis not present

## 2012-08-19 DIAGNOSIS — D631 Anemia in chronic kidney disease: Secondary | ICD-10-CM | POA: Diagnosis not present

## 2012-08-21 DIAGNOSIS — N186 End stage renal disease: Secondary | ICD-10-CM | POA: Diagnosis not present

## 2012-08-22 DIAGNOSIS — N186 End stage renal disease: Secondary | ICD-10-CM | POA: Diagnosis not present

## 2012-08-22 DIAGNOSIS — D509 Iron deficiency anemia, unspecified: Secondary | ICD-10-CM | POA: Diagnosis not present

## 2012-08-22 DIAGNOSIS — N2581 Secondary hyperparathyroidism of renal origin: Secondary | ICD-10-CM | POA: Diagnosis not present

## 2012-08-22 DIAGNOSIS — D631 Anemia in chronic kidney disease: Secondary | ICD-10-CM | POA: Diagnosis not present

## 2012-08-23 ENCOUNTER — Other Ambulatory Visit: Payer: Self-pay

## 2012-08-23 MED ORDER — BUDESONIDE-FORMOTEROL FUMARATE 80-4.5 MCG/ACT IN AERO: 2.0000 | INHALATION_SPRAY | Freq: Two times a day (BID) | RESPIRATORY_TRACT | Status: DC

## 2012-08-24 DIAGNOSIS — D509 Iron deficiency anemia, unspecified: Secondary | ICD-10-CM | POA: Diagnosis not present

## 2012-08-24 DIAGNOSIS — N2581 Secondary hyperparathyroidism of renal origin: Secondary | ICD-10-CM | POA: Diagnosis not present

## 2012-08-24 DIAGNOSIS — D631 Anemia in chronic kidney disease: Secondary | ICD-10-CM | POA: Diagnosis not present

## 2012-08-24 DIAGNOSIS — N186 End stage renal disease: Secondary | ICD-10-CM | POA: Diagnosis not present

## 2012-08-26 DIAGNOSIS — D631 Anemia in chronic kidney disease: Secondary | ICD-10-CM | POA: Diagnosis not present

## 2012-08-26 DIAGNOSIS — N186 End stage renal disease: Secondary | ICD-10-CM | POA: Diagnosis not present

## 2012-08-26 DIAGNOSIS — N2581 Secondary hyperparathyroidism of renal origin: Secondary | ICD-10-CM | POA: Diagnosis not present

## 2012-08-26 DIAGNOSIS — D509 Iron deficiency anemia, unspecified: Secondary | ICD-10-CM | POA: Diagnosis not present

## 2012-08-29 DIAGNOSIS — N186 End stage renal disease: Secondary | ICD-10-CM | POA: Diagnosis not present

## 2012-08-29 DIAGNOSIS — D631 Anemia in chronic kidney disease: Secondary | ICD-10-CM | POA: Diagnosis not present

## 2012-08-29 DIAGNOSIS — D509 Iron deficiency anemia, unspecified: Secondary | ICD-10-CM | POA: Diagnosis not present

## 2012-08-29 DIAGNOSIS — N2581 Secondary hyperparathyroidism of renal origin: Secondary | ICD-10-CM | POA: Diagnosis not present

## 2012-08-31 DIAGNOSIS — N186 End stage renal disease: Secondary | ICD-10-CM | POA: Diagnosis not present

## 2012-08-31 DIAGNOSIS — D509 Iron deficiency anemia, unspecified: Secondary | ICD-10-CM | POA: Diagnosis not present

## 2012-08-31 DIAGNOSIS — D631 Anemia in chronic kidney disease: Secondary | ICD-10-CM | POA: Diagnosis not present

## 2012-08-31 DIAGNOSIS — N2581 Secondary hyperparathyroidism of renal origin: Secondary | ICD-10-CM | POA: Diagnosis not present

## 2012-09-02 DIAGNOSIS — D631 Anemia in chronic kidney disease: Secondary | ICD-10-CM | POA: Diagnosis not present

## 2012-09-02 DIAGNOSIS — N2581 Secondary hyperparathyroidism of renal origin: Secondary | ICD-10-CM | POA: Diagnosis not present

## 2012-09-02 DIAGNOSIS — D509 Iron deficiency anemia, unspecified: Secondary | ICD-10-CM | POA: Diagnosis not present

## 2012-09-02 DIAGNOSIS — N186 End stage renal disease: Secondary | ICD-10-CM | POA: Diagnosis not present

## 2012-09-02 DIAGNOSIS — N039 Chronic nephritic syndrome with unspecified morphologic changes: Secondary | ICD-10-CM | POA: Diagnosis not present

## 2012-09-05 DIAGNOSIS — N2581 Secondary hyperparathyroidism of renal origin: Secondary | ICD-10-CM | POA: Diagnosis not present

## 2012-09-05 DIAGNOSIS — D631 Anemia in chronic kidney disease: Secondary | ICD-10-CM | POA: Diagnosis not present

## 2012-09-05 DIAGNOSIS — N039 Chronic nephritic syndrome with unspecified morphologic changes: Secondary | ICD-10-CM | POA: Diagnosis not present

## 2012-09-05 DIAGNOSIS — D509 Iron deficiency anemia, unspecified: Secondary | ICD-10-CM | POA: Diagnosis not present

## 2012-09-05 DIAGNOSIS — N186 End stage renal disease: Secondary | ICD-10-CM | POA: Diagnosis not present

## 2012-09-07 DIAGNOSIS — N039 Chronic nephritic syndrome with unspecified morphologic changes: Secondary | ICD-10-CM | POA: Diagnosis not present

## 2012-09-07 DIAGNOSIS — N2581 Secondary hyperparathyroidism of renal origin: Secondary | ICD-10-CM | POA: Diagnosis not present

## 2012-09-07 DIAGNOSIS — D509 Iron deficiency anemia, unspecified: Secondary | ICD-10-CM | POA: Diagnosis not present

## 2012-09-07 DIAGNOSIS — N186 End stage renal disease: Secondary | ICD-10-CM | POA: Diagnosis not present

## 2012-09-07 DIAGNOSIS — D631 Anemia in chronic kidney disease: Secondary | ICD-10-CM | POA: Diagnosis not present

## 2012-09-09 DIAGNOSIS — D509 Iron deficiency anemia, unspecified: Secondary | ICD-10-CM | POA: Diagnosis not present

## 2012-09-09 DIAGNOSIS — N186 End stage renal disease: Secondary | ICD-10-CM | POA: Diagnosis not present

## 2012-09-09 DIAGNOSIS — N2581 Secondary hyperparathyroidism of renal origin: Secondary | ICD-10-CM | POA: Diagnosis not present

## 2012-09-09 DIAGNOSIS — D631 Anemia in chronic kidney disease: Secondary | ICD-10-CM | POA: Diagnosis not present

## 2012-09-12 DIAGNOSIS — N186 End stage renal disease: Secondary | ICD-10-CM | POA: Diagnosis not present

## 2012-09-12 DIAGNOSIS — D631 Anemia in chronic kidney disease: Secondary | ICD-10-CM | POA: Diagnosis not present

## 2012-09-12 DIAGNOSIS — D509 Iron deficiency anemia, unspecified: Secondary | ICD-10-CM | POA: Diagnosis not present

## 2012-09-12 DIAGNOSIS — N2581 Secondary hyperparathyroidism of renal origin: Secondary | ICD-10-CM | POA: Diagnosis not present

## 2012-09-12 DIAGNOSIS — N039 Chronic nephritic syndrome with unspecified morphologic changes: Secondary | ICD-10-CM | POA: Diagnosis not present

## 2012-09-14 DIAGNOSIS — N039 Chronic nephritic syndrome with unspecified morphologic changes: Secondary | ICD-10-CM | POA: Diagnosis not present

## 2012-09-14 DIAGNOSIS — N2581 Secondary hyperparathyroidism of renal origin: Secondary | ICD-10-CM | POA: Diagnosis not present

## 2012-09-14 DIAGNOSIS — N186 End stage renal disease: Secondary | ICD-10-CM | POA: Diagnosis not present

## 2012-09-14 DIAGNOSIS — D631 Anemia in chronic kidney disease: Secondary | ICD-10-CM | POA: Diagnosis not present

## 2012-09-14 DIAGNOSIS — D509 Iron deficiency anemia, unspecified: Secondary | ICD-10-CM | POA: Diagnosis not present

## 2012-09-16 DIAGNOSIS — N186 End stage renal disease: Secondary | ICD-10-CM | POA: Diagnosis not present

## 2012-09-16 DIAGNOSIS — D509 Iron deficiency anemia, unspecified: Secondary | ICD-10-CM | POA: Diagnosis not present

## 2012-09-16 DIAGNOSIS — D631 Anemia in chronic kidney disease: Secondary | ICD-10-CM | POA: Diagnosis not present

## 2012-09-16 DIAGNOSIS — N2581 Secondary hyperparathyroidism of renal origin: Secondary | ICD-10-CM | POA: Diagnosis not present

## 2012-09-18 ENCOUNTER — Telehealth: Payer: Self-pay | Admitting: Family Medicine

## 2012-09-18 MED ORDER — HYDROCODONE-ACETAMINOPHEN 5-325 MG PO TABS: 1.0000 | ORAL_TABLET | Freq: Two times a day (BID) | ORAL | Status: DC | PRN

## 2012-09-18 NOTE — Telephone Encounter (Signed)
printed

## 2012-09-19 ENCOUNTER — Encounter: Payer: Self-pay | Admitting: *Deleted

## 2012-09-19 DIAGNOSIS — N2581 Secondary hyperparathyroidism of renal origin: Secondary | ICD-10-CM | POA: Diagnosis not present

## 2012-09-19 DIAGNOSIS — N186 End stage renal disease: Secondary | ICD-10-CM | POA: Diagnosis not present

## 2012-09-19 DIAGNOSIS — D509 Iron deficiency anemia, unspecified: Secondary | ICD-10-CM | POA: Diagnosis not present

## 2012-09-19 DIAGNOSIS — D631 Anemia in chronic kidney disease: Secondary | ICD-10-CM | POA: Diagnosis not present

## 2012-09-20 DIAGNOSIS — N186 End stage renal disease: Secondary | ICD-10-CM | POA: Diagnosis not present

## 2012-09-21 DIAGNOSIS — D631 Anemia in chronic kidney disease: Secondary | ICD-10-CM | POA: Diagnosis not present

## 2012-09-21 DIAGNOSIS — N039 Chronic nephritic syndrome with unspecified morphologic changes: Secondary | ICD-10-CM | POA: Diagnosis not present

## 2012-09-21 DIAGNOSIS — N2581 Secondary hyperparathyroidism of renal origin: Secondary | ICD-10-CM | POA: Diagnosis not present

## 2012-09-21 DIAGNOSIS — D509 Iron deficiency anemia, unspecified: Secondary | ICD-10-CM | POA: Diagnosis not present

## 2012-09-21 DIAGNOSIS — N186 End stage renal disease: Secondary | ICD-10-CM | POA: Diagnosis not present

## 2012-09-23 DIAGNOSIS — N186 End stage renal disease: Secondary | ICD-10-CM | POA: Diagnosis not present

## 2012-09-23 DIAGNOSIS — N2581 Secondary hyperparathyroidism of renal origin: Secondary | ICD-10-CM | POA: Diagnosis not present

## 2012-09-23 DIAGNOSIS — D509 Iron deficiency anemia, unspecified: Secondary | ICD-10-CM | POA: Diagnosis not present

## 2012-09-23 DIAGNOSIS — D631 Anemia in chronic kidney disease: Secondary | ICD-10-CM | POA: Diagnosis not present

## 2012-09-26 DIAGNOSIS — D509 Iron deficiency anemia, unspecified: Secondary | ICD-10-CM | POA: Diagnosis not present

## 2012-09-26 DIAGNOSIS — D631 Anemia in chronic kidney disease: Secondary | ICD-10-CM | POA: Diagnosis not present

## 2012-09-26 DIAGNOSIS — N2581 Secondary hyperparathyroidism of renal origin: Secondary | ICD-10-CM | POA: Diagnosis not present

## 2012-09-26 DIAGNOSIS — N186 End stage renal disease: Secondary | ICD-10-CM | POA: Diagnosis not present

## 2012-09-27 DIAGNOSIS — T82898A Other specified complication of vascular prosthetic devices, implants and grafts, initial encounter: Secondary | ICD-10-CM | POA: Diagnosis not present

## 2012-09-27 DIAGNOSIS — N186 End stage renal disease: Secondary | ICD-10-CM | POA: Diagnosis not present

## 2012-09-28 DIAGNOSIS — D631 Anemia in chronic kidney disease: Secondary | ICD-10-CM | POA: Diagnosis not present

## 2012-09-28 DIAGNOSIS — N186 End stage renal disease: Secondary | ICD-10-CM | POA: Diagnosis not present

## 2012-09-28 DIAGNOSIS — D509 Iron deficiency anemia, unspecified: Secondary | ICD-10-CM | POA: Diagnosis not present

## 2012-09-28 DIAGNOSIS — N2581 Secondary hyperparathyroidism of renal origin: Secondary | ICD-10-CM | POA: Diagnosis not present

## 2012-09-30 DIAGNOSIS — N186 End stage renal disease: Secondary | ICD-10-CM | POA: Diagnosis not present

## 2012-09-30 DIAGNOSIS — D631 Anemia in chronic kidney disease: Secondary | ICD-10-CM | POA: Diagnosis not present

## 2012-09-30 DIAGNOSIS — D509 Iron deficiency anemia, unspecified: Secondary | ICD-10-CM | POA: Diagnosis not present

## 2012-09-30 DIAGNOSIS — N2581 Secondary hyperparathyroidism of renal origin: Secondary | ICD-10-CM | POA: Diagnosis not present

## 2012-10-03 DIAGNOSIS — N2581 Secondary hyperparathyroidism of renal origin: Secondary | ICD-10-CM | POA: Diagnosis not present

## 2012-10-03 DIAGNOSIS — D509 Iron deficiency anemia, unspecified: Secondary | ICD-10-CM | POA: Diagnosis not present

## 2012-10-03 DIAGNOSIS — N186 End stage renal disease: Secondary | ICD-10-CM | POA: Diagnosis not present

## 2012-10-03 DIAGNOSIS — D631 Anemia in chronic kidney disease: Secondary | ICD-10-CM | POA: Diagnosis not present

## 2012-10-05 DIAGNOSIS — N039 Chronic nephritic syndrome with unspecified morphologic changes: Secondary | ICD-10-CM | POA: Diagnosis not present

## 2012-10-05 DIAGNOSIS — N186 End stage renal disease: Secondary | ICD-10-CM | POA: Diagnosis not present

## 2012-10-05 DIAGNOSIS — N2581 Secondary hyperparathyroidism of renal origin: Secondary | ICD-10-CM | POA: Diagnosis not present

## 2012-10-05 DIAGNOSIS — D509 Iron deficiency anemia, unspecified: Secondary | ICD-10-CM | POA: Diagnosis not present

## 2012-10-05 DIAGNOSIS — D631 Anemia in chronic kidney disease: Secondary | ICD-10-CM | POA: Diagnosis not present

## 2012-10-07 DIAGNOSIS — N2581 Secondary hyperparathyroidism of renal origin: Secondary | ICD-10-CM | POA: Diagnosis not present

## 2012-10-07 DIAGNOSIS — N186 End stage renal disease: Secondary | ICD-10-CM | POA: Diagnosis not present

## 2012-10-07 DIAGNOSIS — D509 Iron deficiency anemia, unspecified: Secondary | ICD-10-CM | POA: Diagnosis not present

## 2012-10-07 DIAGNOSIS — D631 Anemia in chronic kidney disease: Secondary | ICD-10-CM | POA: Diagnosis not present

## 2012-10-10 DIAGNOSIS — D631 Anemia in chronic kidney disease: Secondary | ICD-10-CM | POA: Diagnosis not present

## 2012-10-10 DIAGNOSIS — D509 Iron deficiency anemia, unspecified: Secondary | ICD-10-CM | POA: Diagnosis not present

## 2012-10-10 DIAGNOSIS — N186 End stage renal disease: Secondary | ICD-10-CM | POA: Diagnosis not present

## 2012-10-10 DIAGNOSIS — N2581 Secondary hyperparathyroidism of renal origin: Secondary | ICD-10-CM | POA: Diagnosis not present

## 2012-10-12 DIAGNOSIS — D631 Anemia in chronic kidney disease: Secondary | ICD-10-CM | POA: Diagnosis not present

## 2012-10-12 DIAGNOSIS — N186 End stage renal disease: Secondary | ICD-10-CM | POA: Diagnosis not present

## 2012-10-12 DIAGNOSIS — N2581 Secondary hyperparathyroidism of renal origin: Secondary | ICD-10-CM | POA: Diagnosis not present

## 2012-10-12 DIAGNOSIS — D509 Iron deficiency anemia, unspecified: Secondary | ICD-10-CM | POA: Diagnosis not present

## 2012-10-14 DIAGNOSIS — D509 Iron deficiency anemia, unspecified: Secondary | ICD-10-CM | POA: Diagnosis not present

## 2012-10-14 DIAGNOSIS — D631 Anemia in chronic kidney disease: Secondary | ICD-10-CM | POA: Diagnosis not present

## 2012-10-14 DIAGNOSIS — N2581 Secondary hyperparathyroidism of renal origin: Secondary | ICD-10-CM | POA: Diagnosis not present

## 2012-10-14 DIAGNOSIS — N186 End stage renal disease: Secondary | ICD-10-CM | POA: Diagnosis not present

## 2012-10-17 ENCOUNTER — Encounter: Payer: Self-pay | Admitting: Family Medicine

## 2012-10-17 DIAGNOSIS — D631 Anemia in chronic kidney disease: Secondary | ICD-10-CM | POA: Diagnosis not present

## 2012-10-17 DIAGNOSIS — N186 End stage renal disease: Secondary | ICD-10-CM | POA: Diagnosis not present

## 2012-10-17 DIAGNOSIS — D509 Iron deficiency anemia, unspecified: Secondary | ICD-10-CM | POA: Diagnosis not present

## 2012-10-17 DIAGNOSIS — N2581 Secondary hyperparathyroidism of renal origin: Secondary | ICD-10-CM | POA: Diagnosis not present

## 2012-10-18 ENCOUNTER — Other Ambulatory Visit: Payer: Self-pay

## 2012-10-18 MED ORDER — HYDROCODONE-ACETAMINOPHEN 5-325 MG PO TABS: 1.0000 | ORAL_TABLET | Freq: Two times a day (BID) | ORAL | Status: DC | PRN

## 2012-10-19 DIAGNOSIS — D631 Anemia in chronic kidney disease: Secondary | ICD-10-CM | POA: Diagnosis not present

## 2012-10-19 DIAGNOSIS — N2581 Secondary hyperparathyroidism of renal origin: Secondary | ICD-10-CM | POA: Diagnosis not present

## 2012-10-19 DIAGNOSIS — D509 Iron deficiency anemia, unspecified: Secondary | ICD-10-CM | POA: Diagnosis not present

## 2012-10-19 DIAGNOSIS — N186 End stage renal disease: Secondary | ICD-10-CM | POA: Diagnosis not present

## 2012-10-21 DIAGNOSIS — D631 Anemia in chronic kidney disease: Secondary | ICD-10-CM | POA: Diagnosis not present

## 2012-10-21 DIAGNOSIS — N186 End stage renal disease: Secondary | ICD-10-CM | POA: Diagnosis not present

## 2012-10-21 DIAGNOSIS — N2581 Secondary hyperparathyroidism of renal origin: Secondary | ICD-10-CM | POA: Diagnosis not present

## 2012-10-21 DIAGNOSIS — N039 Chronic nephritic syndrome with unspecified morphologic changes: Secondary | ICD-10-CM | POA: Diagnosis not present

## 2012-10-21 DIAGNOSIS — D509 Iron deficiency anemia, unspecified: Secondary | ICD-10-CM | POA: Diagnosis not present

## 2012-10-24 DIAGNOSIS — D631 Anemia in chronic kidney disease: Secondary | ICD-10-CM | POA: Diagnosis not present

## 2012-10-24 DIAGNOSIS — N2581 Secondary hyperparathyroidism of renal origin: Secondary | ICD-10-CM | POA: Diagnosis not present

## 2012-10-24 DIAGNOSIS — N186 End stage renal disease: Secondary | ICD-10-CM | POA: Diagnosis not present

## 2012-10-24 DIAGNOSIS — N039 Chronic nephritic syndrome with unspecified morphologic changes: Secondary | ICD-10-CM | POA: Diagnosis not present

## 2012-10-24 DIAGNOSIS — D509 Iron deficiency anemia, unspecified: Secondary | ICD-10-CM | POA: Diagnosis not present

## 2012-10-26 DIAGNOSIS — N2581 Secondary hyperparathyroidism of renal origin: Secondary | ICD-10-CM | POA: Diagnosis not present

## 2012-10-26 DIAGNOSIS — N039 Chronic nephritic syndrome with unspecified morphologic changes: Secondary | ICD-10-CM | POA: Diagnosis not present

## 2012-10-26 DIAGNOSIS — N186 End stage renal disease: Secondary | ICD-10-CM | POA: Diagnosis not present

## 2012-10-26 DIAGNOSIS — D509 Iron deficiency anemia, unspecified: Secondary | ICD-10-CM | POA: Diagnosis not present

## 2012-10-26 DIAGNOSIS — D631 Anemia in chronic kidney disease: Secondary | ICD-10-CM | POA: Diagnosis not present

## 2012-10-28 DIAGNOSIS — D631 Anemia in chronic kidney disease: Secondary | ICD-10-CM | POA: Diagnosis not present

## 2012-10-28 DIAGNOSIS — D509 Iron deficiency anemia, unspecified: Secondary | ICD-10-CM | POA: Diagnosis not present

## 2012-10-28 DIAGNOSIS — N2581 Secondary hyperparathyroidism of renal origin: Secondary | ICD-10-CM | POA: Diagnosis not present

## 2012-10-28 DIAGNOSIS — N186 End stage renal disease: Secondary | ICD-10-CM | POA: Diagnosis not present

## 2012-10-31 DIAGNOSIS — D631 Anemia in chronic kidney disease: Secondary | ICD-10-CM | POA: Diagnosis not present

## 2012-10-31 DIAGNOSIS — N2581 Secondary hyperparathyroidism of renal origin: Secondary | ICD-10-CM | POA: Diagnosis not present

## 2012-10-31 DIAGNOSIS — D509 Iron deficiency anemia, unspecified: Secondary | ICD-10-CM | POA: Diagnosis not present

## 2012-10-31 DIAGNOSIS — N186 End stage renal disease: Secondary | ICD-10-CM | POA: Diagnosis not present

## 2012-11-02 DIAGNOSIS — N186 End stage renal disease: Secondary | ICD-10-CM | POA: Diagnosis not present

## 2012-11-02 DIAGNOSIS — D631 Anemia in chronic kidney disease: Secondary | ICD-10-CM | POA: Diagnosis not present

## 2012-11-02 DIAGNOSIS — D509 Iron deficiency anemia, unspecified: Secondary | ICD-10-CM | POA: Diagnosis not present

## 2012-11-02 DIAGNOSIS — N2581 Secondary hyperparathyroidism of renal origin: Secondary | ICD-10-CM | POA: Diagnosis not present

## 2012-11-04 DIAGNOSIS — N039 Chronic nephritic syndrome with unspecified morphologic changes: Secondary | ICD-10-CM | POA: Diagnosis not present

## 2012-11-04 DIAGNOSIS — N2581 Secondary hyperparathyroidism of renal origin: Secondary | ICD-10-CM | POA: Diagnosis not present

## 2012-11-04 DIAGNOSIS — N186 End stage renal disease: Secondary | ICD-10-CM | POA: Diagnosis not present

## 2012-11-04 DIAGNOSIS — D631 Anemia in chronic kidney disease: Secondary | ICD-10-CM | POA: Diagnosis not present

## 2012-11-04 DIAGNOSIS — D509 Iron deficiency anemia, unspecified: Secondary | ICD-10-CM | POA: Diagnosis not present

## 2012-11-07 DIAGNOSIS — D509 Iron deficiency anemia, unspecified: Secondary | ICD-10-CM | POA: Diagnosis not present

## 2012-11-07 DIAGNOSIS — N186 End stage renal disease: Secondary | ICD-10-CM | POA: Diagnosis not present

## 2012-11-07 DIAGNOSIS — D631 Anemia in chronic kidney disease: Secondary | ICD-10-CM | POA: Diagnosis not present

## 2012-11-07 DIAGNOSIS — N2581 Secondary hyperparathyroidism of renal origin: Secondary | ICD-10-CM | POA: Diagnosis not present

## 2012-11-09 DIAGNOSIS — N2581 Secondary hyperparathyroidism of renal origin: Secondary | ICD-10-CM | POA: Diagnosis not present

## 2012-11-09 DIAGNOSIS — N186 End stage renal disease: Secondary | ICD-10-CM | POA: Diagnosis not present

## 2012-11-09 DIAGNOSIS — D631 Anemia in chronic kidney disease: Secondary | ICD-10-CM | POA: Diagnosis not present

## 2012-11-09 DIAGNOSIS — D509 Iron deficiency anemia, unspecified: Secondary | ICD-10-CM | POA: Diagnosis not present

## 2012-11-11 DIAGNOSIS — N186 End stage renal disease: Secondary | ICD-10-CM | POA: Diagnosis not present

## 2012-11-11 DIAGNOSIS — D631 Anemia in chronic kidney disease: Secondary | ICD-10-CM | POA: Diagnosis not present

## 2012-11-11 DIAGNOSIS — N2581 Secondary hyperparathyroidism of renal origin: Secondary | ICD-10-CM | POA: Diagnosis not present

## 2012-11-11 DIAGNOSIS — D509 Iron deficiency anemia, unspecified: Secondary | ICD-10-CM | POA: Diagnosis not present

## 2012-11-14 DIAGNOSIS — N186 End stage renal disease: Secondary | ICD-10-CM | POA: Diagnosis not present

## 2012-11-14 DIAGNOSIS — N2581 Secondary hyperparathyroidism of renal origin: Secondary | ICD-10-CM | POA: Diagnosis not present

## 2012-11-14 DIAGNOSIS — N039 Chronic nephritic syndrome with unspecified morphologic changes: Secondary | ICD-10-CM | POA: Diagnosis not present

## 2012-11-14 DIAGNOSIS — D631 Anemia in chronic kidney disease: Secondary | ICD-10-CM | POA: Diagnosis not present

## 2012-11-14 DIAGNOSIS — D509 Iron deficiency anemia, unspecified: Secondary | ICD-10-CM | POA: Diagnosis not present

## 2012-11-16 ENCOUNTER — Telehealth: Payer: Self-pay | Admitting: Family Medicine

## 2012-11-16 DIAGNOSIS — N186 End stage renal disease: Secondary | ICD-10-CM | POA: Diagnosis not present

## 2012-11-16 DIAGNOSIS — N039 Chronic nephritic syndrome with unspecified morphologic changes: Secondary | ICD-10-CM | POA: Diagnosis not present

## 2012-11-16 DIAGNOSIS — D509 Iron deficiency anemia, unspecified: Secondary | ICD-10-CM | POA: Diagnosis not present

## 2012-11-16 DIAGNOSIS — D631 Anemia in chronic kidney disease: Secondary | ICD-10-CM | POA: Diagnosis not present

## 2012-11-16 DIAGNOSIS — N2581 Secondary hyperparathyroidism of renal origin: Secondary | ICD-10-CM | POA: Diagnosis not present

## 2012-11-16 NOTE — Telephone Encounter (Signed)
Yes, he needs to pick up, okay to refill

## 2012-11-16 NOTE — Telephone Encounter (Signed)
?  ok to refill °

## 2012-11-17 MED ORDER — HYDROCODONE-ACETAMINOPHEN 5-325 MG PO TABS: 1.0000 | ORAL_TABLET | Freq: Two times a day (BID) | ORAL | Status: DC | PRN

## 2012-11-17 NOTE — Telephone Encounter (Signed)
Med printed out ready to pickup

## 2012-11-18 DIAGNOSIS — N186 End stage renal disease: Secondary | ICD-10-CM | POA: Diagnosis not present

## 2012-11-18 DIAGNOSIS — N039 Chronic nephritic syndrome with unspecified morphologic changes: Secondary | ICD-10-CM | POA: Diagnosis not present

## 2012-11-18 DIAGNOSIS — D631 Anemia in chronic kidney disease: Secondary | ICD-10-CM | POA: Diagnosis not present

## 2012-11-18 DIAGNOSIS — D509 Iron deficiency anemia, unspecified: Secondary | ICD-10-CM | POA: Diagnosis not present

## 2012-11-18 DIAGNOSIS — N2581 Secondary hyperparathyroidism of renal origin: Secondary | ICD-10-CM | POA: Diagnosis not present

## 2012-11-20 DIAGNOSIS — N186 End stage renal disease: Secondary | ICD-10-CM | POA: Diagnosis not present

## 2012-11-21 ENCOUNTER — Encounter: Payer: Self-pay | Admitting: Family Medicine

## 2012-11-21 DIAGNOSIS — D631 Anemia in chronic kidney disease: Secondary | ICD-10-CM | POA: Diagnosis not present

## 2012-11-21 DIAGNOSIS — D509 Iron deficiency anemia, unspecified: Secondary | ICD-10-CM | POA: Diagnosis not present

## 2012-11-21 DIAGNOSIS — N2581 Secondary hyperparathyroidism of renal origin: Secondary | ICD-10-CM | POA: Diagnosis not present

## 2012-11-21 DIAGNOSIS — N186 End stage renal disease: Secondary | ICD-10-CM | POA: Diagnosis not present

## 2012-11-23 DIAGNOSIS — N186 End stage renal disease: Secondary | ICD-10-CM | POA: Diagnosis not present

## 2012-11-23 DIAGNOSIS — D509 Iron deficiency anemia, unspecified: Secondary | ICD-10-CM | POA: Diagnosis not present

## 2012-11-23 DIAGNOSIS — D631 Anemia in chronic kidney disease: Secondary | ICD-10-CM | POA: Diagnosis not present

## 2012-11-23 DIAGNOSIS — N2581 Secondary hyperparathyroidism of renal origin: Secondary | ICD-10-CM | POA: Diagnosis not present

## 2012-11-25 DIAGNOSIS — D631 Anemia in chronic kidney disease: Secondary | ICD-10-CM | POA: Diagnosis not present

## 2012-11-25 DIAGNOSIS — N186 End stage renal disease: Secondary | ICD-10-CM | POA: Diagnosis not present

## 2012-11-25 DIAGNOSIS — N039 Chronic nephritic syndrome with unspecified morphologic changes: Secondary | ICD-10-CM | POA: Diagnosis not present

## 2012-11-25 DIAGNOSIS — N2581 Secondary hyperparathyroidism of renal origin: Secondary | ICD-10-CM | POA: Diagnosis not present

## 2012-11-25 DIAGNOSIS — D509 Iron deficiency anemia, unspecified: Secondary | ICD-10-CM | POA: Diagnosis not present

## 2012-11-27 ENCOUNTER — Encounter: Payer: Medicare Other | Admitting: Family Medicine

## 2012-11-27 ENCOUNTER — Encounter: Payer: Self-pay | Admitting: Family Medicine

## 2012-11-28 DIAGNOSIS — N2581 Secondary hyperparathyroidism of renal origin: Secondary | ICD-10-CM | POA: Diagnosis not present

## 2012-11-28 DIAGNOSIS — D631 Anemia in chronic kidney disease: Secondary | ICD-10-CM | POA: Diagnosis not present

## 2012-11-28 DIAGNOSIS — D509 Iron deficiency anemia, unspecified: Secondary | ICD-10-CM | POA: Diagnosis not present

## 2012-11-28 DIAGNOSIS — N186 End stage renal disease: Secondary | ICD-10-CM | POA: Diagnosis not present

## 2012-11-30 DIAGNOSIS — N186 End stage renal disease: Secondary | ICD-10-CM | POA: Diagnosis not present

## 2012-11-30 DIAGNOSIS — N039 Chronic nephritic syndrome with unspecified morphologic changes: Secondary | ICD-10-CM | POA: Diagnosis not present

## 2012-11-30 DIAGNOSIS — D631 Anemia in chronic kidney disease: Secondary | ICD-10-CM | POA: Diagnosis not present

## 2012-11-30 DIAGNOSIS — N2581 Secondary hyperparathyroidism of renal origin: Secondary | ICD-10-CM | POA: Diagnosis not present

## 2012-11-30 DIAGNOSIS — D509 Iron deficiency anemia, unspecified: Secondary | ICD-10-CM | POA: Diagnosis not present

## 2012-12-02 DIAGNOSIS — D509 Iron deficiency anemia, unspecified: Secondary | ICD-10-CM | POA: Diagnosis not present

## 2012-12-02 DIAGNOSIS — D631 Anemia in chronic kidney disease: Secondary | ICD-10-CM | POA: Diagnosis not present

## 2012-12-02 DIAGNOSIS — N2581 Secondary hyperparathyroidism of renal origin: Secondary | ICD-10-CM | POA: Diagnosis not present

## 2012-12-02 DIAGNOSIS — N186 End stage renal disease: Secondary | ICD-10-CM | POA: Diagnosis not present

## 2012-12-02 DIAGNOSIS — N039 Chronic nephritic syndrome with unspecified morphologic changes: Secondary | ICD-10-CM | POA: Diagnosis not present

## 2012-12-04 ENCOUNTER — Encounter: Payer: Self-pay | Admitting: Family Medicine

## 2012-12-05 DIAGNOSIS — N2581 Secondary hyperparathyroidism of renal origin: Secondary | ICD-10-CM | POA: Diagnosis not present

## 2012-12-05 DIAGNOSIS — D631 Anemia in chronic kidney disease: Secondary | ICD-10-CM | POA: Diagnosis not present

## 2012-12-05 DIAGNOSIS — N186 End stage renal disease: Secondary | ICD-10-CM | POA: Diagnosis not present

## 2012-12-05 DIAGNOSIS — D509 Iron deficiency anemia, unspecified: Secondary | ICD-10-CM | POA: Diagnosis not present

## 2012-12-07 DIAGNOSIS — N039 Chronic nephritic syndrome with unspecified morphologic changes: Secondary | ICD-10-CM | POA: Diagnosis not present

## 2012-12-07 DIAGNOSIS — N186 End stage renal disease: Secondary | ICD-10-CM | POA: Diagnosis not present

## 2012-12-07 DIAGNOSIS — N2581 Secondary hyperparathyroidism of renal origin: Secondary | ICD-10-CM | POA: Diagnosis not present

## 2012-12-07 DIAGNOSIS — D631 Anemia in chronic kidney disease: Secondary | ICD-10-CM | POA: Diagnosis not present

## 2012-12-07 DIAGNOSIS — D509 Iron deficiency anemia, unspecified: Secondary | ICD-10-CM | POA: Diagnosis not present

## 2012-12-09 DIAGNOSIS — N2581 Secondary hyperparathyroidism of renal origin: Secondary | ICD-10-CM | POA: Diagnosis not present

## 2012-12-09 DIAGNOSIS — D631 Anemia in chronic kidney disease: Secondary | ICD-10-CM | POA: Diagnosis not present

## 2012-12-09 DIAGNOSIS — N186 End stage renal disease: Secondary | ICD-10-CM | POA: Diagnosis not present

## 2012-12-09 DIAGNOSIS — D509 Iron deficiency anemia, unspecified: Secondary | ICD-10-CM | POA: Diagnosis not present

## 2012-12-09 DIAGNOSIS — N039 Chronic nephritic syndrome with unspecified morphologic changes: Secondary | ICD-10-CM | POA: Diagnosis not present

## 2012-12-12 DIAGNOSIS — N2581 Secondary hyperparathyroidism of renal origin: Secondary | ICD-10-CM | POA: Diagnosis not present

## 2012-12-12 DIAGNOSIS — D509 Iron deficiency anemia, unspecified: Secondary | ICD-10-CM | POA: Diagnosis not present

## 2012-12-12 DIAGNOSIS — N186 End stage renal disease: Secondary | ICD-10-CM | POA: Diagnosis not present

## 2012-12-12 DIAGNOSIS — N039 Chronic nephritic syndrome with unspecified morphologic changes: Secondary | ICD-10-CM | POA: Diagnosis not present

## 2012-12-12 DIAGNOSIS — D631 Anemia in chronic kidney disease: Secondary | ICD-10-CM | POA: Diagnosis not present

## 2012-12-14 DIAGNOSIS — D509 Iron deficiency anemia, unspecified: Secondary | ICD-10-CM | POA: Diagnosis not present

## 2012-12-14 DIAGNOSIS — N039 Chronic nephritic syndrome with unspecified morphologic changes: Secondary | ICD-10-CM | POA: Diagnosis not present

## 2012-12-14 DIAGNOSIS — N2581 Secondary hyperparathyroidism of renal origin: Secondary | ICD-10-CM | POA: Diagnosis not present

## 2012-12-14 DIAGNOSIS — N186 End stage renal disease: Secondary | ICD-10-CM | POA: Diagnosis not present

## 2012-12-14 DIAGNOSIS — D631 Anemia in chronic kidney disease: Secondary | ICD-10-CM | POA: Diagnosis not present

## 2012-12-16 DIAGNOSIS — D631 Anemia in chronic kidney disease: Secondary | ICD-10-CM | POA: Diagnosis not present

## 2012-12-16 DIAGNOSIS — N186 End stage renal disease: Secondary | ICD-10-CM | POA: Diagnosis not present

## 2012-12-16 DIAGNOSIS — N2581 Secondary hyperparathyroidism of renal origin: Secondary | ICD-10-CM | POA: Diagnosis not present

## 2012-12-16 DIAGNOSIS — N039 Chronic nephritic syndrome with unspecified morphologic changes: Secondary | ICD-10-CM | POA: Diagnosis not present

## 2012-12-16 DIAGNOSIS — D509 Iron deficiency anemia, unspecified: Secondary | ICD-10-CM | POA: Diagnosis not present

## 2012-12-18 ENCOUNTER — Telehealth: Payer: Self-pay | Admitting: Family Medicine

## 2012-12-18 MED ORDER — HYDROCODONE-ACETAMINOPHEN 5-325 MG PO TABS: 1.0000 | ORAL_TABLET | Freq: Two times a day (BID) | ORAL | Status: DC | PRN

## 2012-12-18 NOTE — Telephone Encounter (Signed)
Ok to refill 

## 2012-12-18 NOTE — Telephone Encounter (Signed)
Med refilled.

## 2012-12-18 NOTE — Telephone Encounter (Signed)
Okay to refill? 

## 2012-12-19 DIAGNOSIS — D509 Iron deficiency anemia, unspecified: Secondary | ICD-10-CM | POA: Diagnosis not present

## 2012-12-19 DIAGNOSIS — D631 Anemia in chronic kidney disease: Secondary | ICD-10-CM | POA: Diagnosis not present

## 2012-12-19 DIAGNOSIS — N2581 Secondary hyperparathyroidism of renal origin: Secondary | ICD-10-CM | POA: Diagnosis not present

## 2012-12-19 DIAGNOSIS — N186 End stage renal disease: Secondary | ICD-10-CM | POA: Diagnosis not present

## 2012-12-21 DIAGNOSIS — N186 End stage renal disease: Secondary | ICD-10-CM | POA: Diagnosis not present

## 2012-12-21 DIAGNOSIS — D631 Anemia in chronic kidney disease: Secondary | ICD-10-CM | POA: Diagnosis not present

## 2012-12-21 DIAGNOSIS — D509 Iron deficiency anemia, unspecified: Secondary | ICD-10-CM | POA: Diagnosis not present

## 2012-12-21 DIAGNOSIS — N2581 Secondary hyperparathyroidism of renal origin: Secondary | ICD-10-CM | POA: Diagnosis not present

## 2012-12-23 DIAGNOSIS — N186 End stage renal disease: Secondary | ICD-10-CM | POA: Diagnosis not present

## 2012-12-23 DIAGNOSIS — D509 Iron deficiency anemia, unspecified: Secondary | ICD-10-CM | POA: Diagnosis not present

## 2012-12-23 DIAGNOSIS — D631 Anemia in chronic kidney disease: Secondary | ICD-10-CM | POA: Diagnosis not present

## 2012-12-23 DIAGNOSIS — N2581 Secondary hyperparathyroidism of renal origin: Secondary | ICD-10-CM | POA: Diagnosis not present

## 2012-12-25 ENCOUNTER — Encounter: Payer: Medicare Other | Admitting: Family Medicine

## 2012-12-26 DIAGNOSIS — D631 Anemia in chronic kidney disease: Secondary | ICD-10-CM | POA: Diagnosis not present

## 2012-12-26 DIAGNOSIS — N039 Chronic nephritic syndrome with unspecified morphologic changes: Secondary | ICD-10-CM | POA: Diagnosis not present

## 2012-12-26 DIAGNOSIS — N186 End stage renal disease: Secondary | ICD-10-CM | POA: Diagnosis not present

## 2012-12-26 DIAGNOSIS — D509 Iron deficiency anemia, unspecified: Secondary | ICD-10-CM | POA: Diagnosis not present

## 2012-12-26 DIAGNOSIS — N2581 Secondary hyperparathyroidism of renal origin: Secondary | ICD-10-CM | POA: Diagnosis not present

## 2012-12-28 DIAGNOSIS — D631 Anemia in chronic kidney disease: Secondary | ICD-10-CM | POA: Diagnosis not present

## 2012-12-28 DIAGNOSIS — D509 Iron deficiency anemia, unspecified: Secondary | ICD-10-CM | POA: Diagnosis not present

## 2012-12-28 DIAGNOSIS — N186 End stage renal disease: Secondary | ICD-10-CM | POA: Diagnosis not present

## 2012-12-28 DIAGNOSIS — N2581 Secondary hyperparathyroidism of renal origin: Secondary | ICD-10-CM | POA: Diagnosis not present

## 2012-12-30 DIAGNOSIS — D509 Iron deficiency anemia, unspecified: Secondary | ICD-10-CM | POA: Diagnosis not present

## 2012-12-30 DIAGNOSIS — N186 End stage renal disease: Secondary | ICD-10-CM | POA: Diagnosis not present

## 2012-12-30 DIAGNOSIS — D631 Anemia in chronic kidney disease: Secondary | ICD-10-CM | POA: Diagnosis not present

## 2012-12-30 DIAGNOSIS — N039 Chronic nephritic syndrome with unspecified morphologic changes: Secondary | ICD-10-CM | POA: Diagnosis not present

## 2012-12-30 DIAGNOSIS — N2581 Secondary hyperparathyroidism of renal origin: Secondary | ICD-10-CM | POA: Diagnosis not present

## 2013-01-01 ENCOUNTER — Ambulatory Visit (INDEPENDENT_AMBULATORY_CARE_PROVIDER_SITE_OTHER): Payer: Medicare Other | Admitting: Family Medicine

## 2013-01-01 ENCOUNTER — Encounter: Payer: Self-pay | Admitting: Family Medicine

## 2013-01-01 VITALS — BP 138/90 | HR 88 | Temp 97.1°F | Resp 18 | Ht 72.0 in | Wt 275.0 lb

## 2013-01-01 DIAGNOSIS — M25519 Pain in unspecified shoulder: Secondary | ICD-10-CM

## 2013-01-01 DIAGNOSIS — Z0000 Encounter for general adult medical examination without abnormal findings: Secondary | ICD-10-CM

## 2013-01-01 DIAGNOSIS — G8929 Other chronic pain: Secondary | ICD-10-CM

## 2013-01-01 DIAGNOSIS — J449 Chronic obstructive pulmonary disease, unspecified: Secondary | ICD-10-CM

## 2013-01-01 DIAGNOSIS — I10 Essential (primary) hypertension: Secondary | ICD-10-CM

## 2013-01-01 DIAGNOSIS — Z125 Encounter for screening for malignant neoplasm of prostate: Secondary | ICD-10-CM

## 2013-01-01 DIAGNOSIS — Z79899 Other long term (current) drug therapy: Secondary | ICD-10-CM

## 2013-01-01 DIAGNOSIS — G4700 Insomnia, unspecified: Secondary | ICD-10-CM

## 2013-01-01 MED ORDER — HYDROCODONE-ACETAMINOPHEN 5-325 MG PO TABS: 1.0000 | ORAL_TABLET | Freq: Four times a day (QID) | ORAL | Status: DC | PRN

## 2013-01-01 MED ORDER — TRAZODONE HCL 50 MG PO TABS: ORAL_TABLET | ORAL | Status: DC

## 2013-01-01 NOTE — Assessment & Plan Note (Signed)
Trial of trazodone 50-100mg  at bedtime

## 2013-01-01 NOTE — Assessment & Plan Note (Signed)
BP looks good today. 

## 2013-01-01 NOTE — Patient Instructions (Addendum)
Continue current medication Next pain script will give 90 tablets a month Start the trazodone take 1 tablet, if needed increase to 2 tablets We will call with lab results F/U 4 months

## 2013-01-01 NOTE — Progress Notes (Signed)
Subjective:    Patient ID: Brian Barrera, male    DOB: 07-05-1970, 42 y.o.   MRN: [id number]  HPI Subjective:   Patient presents for Medicare Annual/Subsequent preventive examination.   Continues to have difficulty sleeping, using restoril only sleeps 3 hours at a time. Ambien tried in past did not help.  Chronic pain - taking meds typically 2 times a day, some days after HD hurts worse in joints takes 3 those days, often runs out 1 week early. Last pill taken yesterday  Review Past Medical/Family/Social: Reviewed no changes   Risk Factors  Current exercise habits: Minimal Dietary issues discussed: Yes  Cardiac risk factors: Obesity (BMI >= 30 kg/m2).   Depression Screen  (Note: if answer to either of the following is "Yes", a more complete depression screening is indicated)  Over the past two weeks, have you felt down, depressed or hopeless? No Over the past two weeks, have you felt little interest or pleasure in doing things? No Have you lost interest or pleasure in daily life? No Do you often feel hopeless? No Do you cry easily over simple problems? No   Activities of Daily Living  In your present state of health, do you have any difficulty performing the following activities?:  Driving? No  Managing money? No  Feeding yourself? No  Getting from bed to chair? No  Climbing a flight of stairs? No  Preparing food and eating?: No  Bathing or showering? No  Getting dressed: No  Getting to the toilet? No  Using the toilet:No  Moving around from place to place: No  In the past year have you fallen or had a near fall?:No  Are you sexually active? No  Do you have more than one partner? No   Hearing Difficulties: No  Do you often ask people to speak up or repeat themselves? No  Do you experience ringing or noises in your ears? No Do you have difficulty understanding soft or whispered voices? No  Do you feel that you have a problem with memory? No Do you often misplace  items? No  Do you feel safe at home? Yes  Cognitive Testing  Alert? Yes Normal Appearance?Yes  Oriented to person? Yes Place? Yes  Time? Yes  Recall of three objects? Yes  Can perform simple calculations? Yes  Displays appropriate judgment?Yes  Can read the correct time from a watch face?Yes   List the Names of Other Physician/Practitioners you currently use:  Dr. Hinda Lenis- Nephrology     Screening Tests / Date Colonoscopy      - Due age 59               Influenza Vaccine - Due Fall 2014 Tetanus/tdap- UTD    Assessment:    Annual wellness medicare exam   Plan:    During the course of the visit the patient was educated and counseled about appropriate screening and preventive services including:  Screening UTD, needs FLU shot in fall Screen negative for depression.  Diet review for nutrition referral? Yes ____ Not Indicated __x__  Patient Instructions (the written plan) was given to the patient.  Fasting labs including PSA, discussed risks and benefits of PSA testing Medicare Attestation  I have personally reviewed:  The patient's medical and social history  Their use of alcohol, tobacco or illicit drugs  Their current medications and supplements  The patient's functional ability including ADLs,fall risks, home safety risks, cognitive, and hearing and visual impairment  Diet and physical activities  Evidence for depression or mood disorders  The patient's weight, height, BMI, and visual acuity have been recorded in the chart. I have made referrals, counseling, and provided education to the patient based on review of the above and I have provided the patient with a written personalized care plan for preventive services.        Review of Systems  GEN- denies fatigue, fever, weight loss,weakness, recent illness HEENT- denies eye drainage, change in vision, nasal discharge, CVS- denies chest pain, palpitations RESP- denies SOB, cough, wheeze ABD- denies N/V, change  in stools, abd pain GU- denies dysuria, hematuria, dribbling, incontinence MSK- + joint pain, muscle aches, injury Neuro- denies headache, dizziness, syncope, seizure activity      Objective:   Physical Exam GEN- NAD, alert and oriented x3, obese HEENT- PERRL , EOMI, oropharynx clear, MMM, TM clear no effusion left side, right side wax obscurring TM ,  Neck- no LAD  CVS- RRR, no murmur RESP-CTAB,  ABD-NABS,soft,NT,ND Pulses- Radial, DP- 2+ GU- Rectal tone normal, prostate smooth, no nodules  FOBT negative Graft Right forearm EXT- No edema,        Assessment & Plan:

## 2013-01-01 NOTE — Assessment & Plan Note (Signed)
On symbicort as covered by insurance

## 2013-01-01 NOTE — Assessment & Plan Note (Signed)
Continue pain meds, serum UDS

## 2013-01-02 DIAGNOSIS — D509 Iron deficiency anemia, unspecified: Secondary | ICD-10-CM | POA: Diagnosis not present

## 2013-01-02 DIAGNOSIS — D631 Anemia in chronic kidney disease: Secondary | ICD-10-CM | POA: Diagnosis not present

## 2013-01-02 DIAGNOSIS — N186 End stage renal disease: Secondary | ICD-10-CM | POA: Diagnosis not present

## 2013-01-02 DIAGNOSIS — N039 Chronic nephritic syndrome with unspecified morphologic changes: Secondary | ICD-10-CM | POA: Diagnosis not present

## 2013-01-02 DIAGNOSIS — N2581 Secondary hyperparathyroidism of renal origin: Secondary | ICD-10-CM | POA: Diagnosis not present

## 2013-01-04 DIAGNOSIS — D509 Iron deficiency anemia, unspecified: Secondary | ICD-10-CM | POA: Diagnosis not present

## 2013-01-04 DIAGNOSIS — Z79899 Other long term (current) drug therapy: Secondary | ICD-10-CM | POA: Diagnosis not present

## 2013-01-04 DIAGNOSIS — N2581 Secondary hyperparathyroidism of renal origin: Secondary | ICD-10-CM | POA: Diagnosis not present

## 2013-01-04 DIAGNOSIS — N186 End stage renal disease: Secondary | ICD-10-CM | POA: Diagnosis not present

## 2013-01-04 DIAGNOSIS — I10 Essential (primary) hypertension: Secondary | ICD-10-CM | POA: Diagnosis not present

## 2013-01-04 DIAGNOSIS — D631 Anemia in chronic kidney disease: Secondary | ICD-10-CM | POA: Diagnosis not present

## 2013-01-04 DIAGNOSIS — Z125 Encounter for screening for malignant neoplasm of prostate: Secondary | ICD-10-CM | POA: Diagnosis not present

## 2013-01-05 LAB — PSA, MEDICARE: PSA: 0.71 ng/mL (ref <=4.00)

## 2013-01-05 LAB — CBC
HCT: 35.8 % — ABNORMAL LOW (ref 39.0–52.0)
Hemoglobin: 11.8 g/dL — ABNORMAL LOW (ref 13.0–17.0)
MCH: 29.9 pg (ref 26.0–34.0)
MCHC: 33 g/dL (ref 30.0–36.0)
MCV: 90.9 fL (ref 78.0–100.0)
Platelets: 218 10*3/uL (ref 150–400)
RBC: 3.94 MIL/uL — ABNORMAL LOW (ref 4.22–5.81)
RDW: 14.4 % (ref 11.5–15.5)
WBC: 7.4 10*3/uL (ref 4.0–10.5)

## 2013-01-05 LAB — LIPID PANEL
Cholesterol: 175 mg/dL (ref 0–200)
HDL: 45 mg/dL (ref >39)
LDL Cholesterol: 96 mg/dL (ref 0–99)
Total CHOL/HDL Ratio: 3.9 Ratio
Triglycerides: 172 mg/dL — ABNORMAL HIGH (ref <150)
VLDL: 34 mg/dL (ref 0–40)

## 2013-01-05 LAB — BASIC METABOLIC PANEL
BUN: 20 mg/dL (ref 6–23)
CO2: 30 mEq/L (ref 19–32)
Calcium: 9.5 mg/dL (ref 8.4–10.5)
Chloride: 96 mEq/L (ref 96–112)
Creat: 5.75 mg/dL — ABNORMAL HIGH (ref 0.50–1.35)
Glucose, Bld: 70 mg/dL (ref 70–99)
Potassium: 3.5 mEq/L (ref 3.5–5.3)
Sodium: 139 mEq/L (ref 135–145)

## 2013-01-06 DIAGNOSIS — D631 Anemia in chronic kidney disease: Secondary | ICD-10-CM | POA: Diagnosis not present

## 2013-01-06 DIAGNOSIS — N039 Chronic nephritic syndrome with unspecified morphologic changes: Secondary | ICD-10-CM | POA: Diagnosis not present

## 2013-01-06 DIAGNOSIS — N2581 Secondary hyperparathyroidism of renal origin: Secondary | ICD-10-CM | POA: Diagnosis not present

## 2013-01-06 DIAGNOSIS — N186 End stage renal disease: Secondary | ICD-10-CM | POA: Diagnosis not present

## 2013-01-06 DIAGNOSIS — D509 Iron deficiency anemia, unspecified: Secondary | ICD-10-CM | POA: Diagnosis not present

## 2013-01-09 DIAGNOSIS — D631 Anemia in chronic kidney disease: Secondary | ICD-10-CM | POA: Diagnosis not present

## 2013-01-09 DIAGNOSIS — D509 Iron deficiency anemia, unspecified: Secondary | ICD-10-CM | POA: Diagnosis not present

## 2013-01-09 DIAGNOSIS — N2581 Secondary hyperparathyroidism of renal origin: Secondary | ICD-10-CM | POA: Diagnosis not present

## 2013-01-09 DIAGNOSIS — N186 End stage renal disease: Secondary | ICD-10-CM | POA: Diagnosis not present

## 2013-01-09 LAB — DRUG SCREEN PANEL (SERUM)

## 2013-01-11 DIAGNOSIS — N186 End stage renal disease: Secondary | ICD-10-CM | POA: Diagnosis not present

## 2013-01-11 DIAGNOSIS — D509 Iron deficiency anemia, unspecified: Secondary | ICD-10-CM | POA: Diagnosis not present

## 2013-01-11 DIAGNOSIS — D631 Anemia in chronic kidney disease: Secondary | ICD-10-CM | POA: Diagnosis not present

## 2013-01-11 DIAGNOSIS — N2581 Secondary hyperparathyroidism of renal origin: Secondary | ICD-10-CM | POA: Diagnosis not present

## 2013-01-13 DIAGNOSIS — D509 Iron deficiency anemia, unspecified: Secondary | ICD-10-CM | POA: Diagnosis not present

## 2013-01-13 DIAGNOSIS — N186 End stage renal disease: Secondary | ICD-10-CM | POA: Diagnosis not present

## 2013-01-13 DIAGNOSIS — D631 Anemia in chronic kidney disease: Secondary | ICD-10-CM | POA: Diagnosis not present

## 2013-01-13 DIAGNOSIS — N2581 Secondary hyperparathyroidism of renal origin: Secondary | ICD-10-CM | POA: Diagnosis not present

## 2013-01-16 DIAGNOSIS — D631 Anemia in chronic kidney disease: Secondary | ICD-10-CM | POA: Diagnosis not present

## 2013-01-16 DIAGNOSIS — N2581 Secondary hyperparathyroidism of renal origin: Secondary | ICD-10-CM | POA: Diagnosis not present

## 2013-01-16 DIAGNOSIS — D509 Iron deficiency anemia, unspecified: Secondary | ICD-10-CM | POA: Diagnosis not present

## 2013-01-16 DIAGNOSIS — N186 End stage renal disease: Secondary | ICD-10-CM | POA: Diagnosis not present

## 2013-01-17 ENCOUNTER — Telehealth: Payer: Self-pay | Admitting: Family Medicine

## 2013-01-17 DIAGNOSIS — J209 Acute bronchitis, unspecified: Secondary | ICD-10-CM | POA: Diagnosis not present

## 2013-01-17 DIAGNOSIS — J441 Chronic obstructive pulmonary disease with (acute) exacerbation: Secondary | ICD-10-CM | POA: Diagnosis not present

## 2013-01-17 DIAGNOSIS — I10 Essential (primary) hypertension: Secondary | ICD-10-CM | POA: Diagnosis not present

## 2013-01-17 DIAGNOSIS — N19 Unspecified kidney failure: Secondary | ICD-10-CM | POA: Diagnosis not present

## 2013-01-17 NOTE — Telephone Encounter (Signed)
Norco 5-325 mg 1 q6 hours prn pain

## 2013-01-17 NOTE — Telephone Encounter (Signed)
?   Ok to refill, last refill 01/01/13

## 2013-01-17 NOTE — Telephone Encounter (Signed)
He is due for fill,I actually changed the quantity that day.He can get 90 tablets , he has to pick up

## 2013-01-18 DIAGNOSIS — D509 Iron deficiency anemia, unspecified: Secondary | ICD-10-CM | POA: Diagnosis not present

## 2013-01-18 DIAGNOSIS — D631 Anemia in chronic kidney disease: Secondary | ICD-10-CM | POA: Diagnosis not present

## 2013-01-18 DIAGNOSIS — N186 End stage renal disease: Secondary | ICD-10-CM | POA: Diagnosis not present

## 2013-01-18 DIAGNOSIS — N2581 Secondary hyperparathyroidism of renal origin: Secondary | ICD-10-CM | POA: Diagnosis not present

## 2013-01-18 MED ORDER — HYDROCODONE-ACETAMINOPHEN 5-325 MG PO TABS: 1.0000 | ORAL_TABLET | Freq: Four times a day (QID) | ORAL | Status: DC | PRN

## 2013-01-18 NOTE — Telephone Encounter (Signed)
Script filled, needs to be signed and ready for pickup

## 2013-01-20 DIAGNOSIS — N2581 Secondary hyperparathyroidism of renal origin: Secondary | ICD-10-CM | POA: Diagnosis not present

## 2013-01-20 DIAGNOSIS — D509 Iron deficiency anemia, unspecified: Secondary | ICD-10-CM | POA: Diagnosis not present

## 2013-01-20 DIAGNOSIS — D631 Anemia in chronic kidney disease: Secondary | ICD-10-CM | POA: Diagnosis not present

## 2013-01-20 DIAGNOSIS — N186 End stage renal disease: Secondary | ICD-10-CM | POA: Diagnosis not present

## 2013-01-21 DIAGNOSIS — N186 End stage renal disease: Secondary | ICD-10-CM | POA: Diagnosis not present

## 2013-01-23 DIAGNOSIS — Z23 Encounter for immunization: Secondary | ICD-10-CM | POA: Diagnosis not present

## 2013-01-23 DIAGNOSIS — N2581 Secondary hyperparathyroidism of renal origin: Secondary | ICD-10-CM | POA: Diagnosis not present

## 2013-01-23 DIAGNOSIS — D631 Anemia in chronic kidney disease: Secondary | ICD-10-CM | POA: Diagnosis not present

## 2013-01-23 DIAGNOSIS — D509 Iron deficiency anemia, unspecified: Secondary | ICD-10-CM | POA: Diagnosis not present

## 2013-01-23 DIAGNOSIS — N186 End stage renal disease: Secondary | ICD-10-CM | POA: Diagnosis not present

## 2013-01-25 DIAGNOSIS — N186 End stage renal disease: Secondary | ICD-10-CM | POA: Diagnosis not present

## 2013-01-25 DIAGNOSIS — D631 Anemia in chronic kidney disease: Secondary | ICD-10-CM | POA: Diagnosis not present

## 2013-01-25 DIAGNOSIS — D509 Iron deficiency anemia, unspecified: Secondary | ICD-10-CM | POA: Diagnosis not present

## 2013-01-25 DIAGNOSIS — Z23 Encounter for immunization: Secondary | ICD-10-CM | POA: Diagnosis not present

## 2013-01-25 DIAGNOSIS — N2581 Secondary hyperparathyroidism of renal origin: Secondary | ICD-10-CM | POA: Diagnosis not present

## 2013-01-27 DIAGNOSIS — N2581 Secondary hyperparathyroidism of renal origin: Secondary | ICD-10-CM | POA: Diagnosis not present

## 2013-01-27 DIAGNOSIS — D509 Iron deficiency anemia, unspecified: Secondary | ICD-10-CM | POA: Diagnosis not present

## 2013-01-27 DIAGNOSIS — D631 Anemia in chronic kidney disease: Secondary | ICD-10-CM | POA: Diagnosis not present

## 2013-01-27 DIAGNOSIS — N186 End stage renal disease: Secondary | ICD-10-CM | POA: Diagnosis not present

## 2013-01-27 DIAGNOSIS — Z23 Encounter for immunization: Secondary | ICD-10-CM | POA: Diagnosis not present

## 2013-01-29 DIAGNOSIS — N186 End stage renal disease: Secondary | ICD-10-CM | POA: Diagnosis not present

## 2013-01-29 DIAGNOSIS — T82898A Other specified complication of vascular prosthetic devices, implants and grafts, initial encounter: Secondary | ICD-10-CM | POA: Diagnosis not present

## 2013-01-30 DIAGNOSIS — N186 End stage renal disease: Secondary | ICD-10-CM | POA: Diagnosis not present

## 2013-01-30 DIAGNOSIS — D509 Iron deficiency anemia, unspecified: Secondary | ICD-10-CM | POA: Diagnosis not present

## 2013-01-30 DIAGNOSIS — D631 Anemia in chronic kidney disease: Secondary | ICD-10-CM | POA: Diagnosis not present

## 2013-01-30 DIAGNOSIS — Z23 Encounter for immunization: Secondary | ICD-10-CM | POA: Diagnosis not present

## 2013-01-30 DIAGNOSIS — N2581 Secondary hyperparathyroidism of renal origin: Secondary | ICD-10-CM | POA: Diagnosis not present

## 2013-02-01 DIAGNOSIS — N2581 Secondary hyperparathyroidism of renal origin: Secondary | ICD-10-CM | POA: Diagnosis not present

## 2013-02-01 DIAGNOSIS — D631 Anemia in chronic kidney disease: Secondary | ICD-10-CM | POA: Diagnosis not present

## 2013-02-01 DIAGNOSIS — N186 End stage renal disease: Secondary | ICD-10-CM | POA: Diagnosis not present

## 2013-02-01 DIAGNOSIS — Z23 Encounter for immunization: Secondary | ICD-10-CM | POA: Diagnosis not present

## 2013-02-01 DIAGNOSIS — D509 Iron deficiency anemia, unspecified: Secondary | ICD-10-CM | POA: Diagnosis not present

## 2013-02-03 DIAGNOSIS — D631 Anemia in chronic kidney disease: Secondary | ICD-10-CM | POA: Diagnosis not present

## 2013-02-03 DIAGNOSIS — D509 Iron deficiency anemia, unspecified: Secondary | ICD-10-CM | POA: Diagnosis not present

## 2013-02-03 DIAGNOSIS — N186 End stage renal disease: Secondary | ICD-10-CM | POA: Diagnosis not present

## 2013-02-03 DIAGNOSIS — N2581 Secondary hyperparathyroidism of renal origin: Secondary | ICD-10-CM | POA: Diagnosis not present

## 2013-02-03 DIAGNOSIS — Z23 Encounter for immunization: Secondary | ICD-10-CM | POA: Diagnosis not present

## 2013-02-06 DIAGNOSIS — Z23 Encounter for immunization: Secondary | ICD-10-CM | POA: Diagnosis not present

## 2013-02-06 DIAGNOSIS — D509 Iron deficiency anemia, unspecified: Secondary | ICD-10-CM | POA: Diagnosis not present

## 2013-02-06 DIAGNOSIS — N2581 Secondary hyperparathyroidism of renal origin: Secondary | ICD-10-CM | POA: Diagnosis not present

## 2013-02-06 DIAGNOSIS — D631 Anemia in chronic kidney disease: Secondary | ICD-10-CM | POA: Diagnosis not present

## 2013-02-06 DIAGNOSIS — N186 End stage renal disease: Secondary | ICD-10-CM | POA: Diagnosis not present

## 2013-02-08 DIAGNOSIS — N186 End stage renal disease: Secondary | ICD-10-CM | POA: Diagnosis not present

## 2013-02-08 DIAGNOSIS — D509 Iron deficiency anemia, unspecified: Secondary | ICD-10-CM | POA: Diagnosis not present

## 2013-02-08 DIAGNOSIS — Z23 Encounter for immunization: Secondary | ICD-10-CM | POA: Diagnosis not present

## 2013-02-08 DIAGNOSIS — N2581 Secondary hyperparathyroidism of renal origin: Secondary | ICD-10-CM | POA: Diagnosis not present

## 2013-02-08 DIAGNOSIS — D631 Anemia in chronic kidney disease: Secondary | ICD-10-CM | POA: Diagnosis not present

## 2013-02-10 DIAGNOSIS — N2581 Secondary hyperparathyroidism of renal origin: Secondary | ICD-10-CM | POA: Diagnosis not present

## 2013-02-10 DIAGNOSIS — Z23 Encounter for immunization: Secondary | ICD-10-CM | POA: Diagnosis not present

## 2013-02-10 DIAGNOSIS — N186 End stage renal disease: Secondary | ICD-10-CM | POA: Diagnosis not present

## 2013-02-10 DIAGNOSIS — D509 Iron deficiency anemia, unspecified: Secondary | ICD-10-CM | POA: Diagnosis not present

## 2013-02-10 DIAGNOSIS — D631 Anemia in chronic kidney disease: Secondary | ICD-10-CM | POA: Diagnosis not present

## 2013-02-13 DIAGNOSIS — D631 Anemia in chronic kidney disease: Secondary | ICD-10-CM | POA: Diagnosis not present

## 2013-02-13 DIAGNOSIS — Z23 Encounter for immunization: Secondary | ICD-10-CM | POA: Diagnosis not present

## 2013-02-13 DIAGNOSIS — D509 Iron deficiency anemia, unspecified: Secondary | ICD-10-CM | POA: Diagnosis not present

## 2013-02-13 DIAGNOSIS — N186 End stage renal disease: Secondary | ICD-10-CM | POA: Diagnosis not present

## 2013-02-13 DIAGNOSIS — N2581 Secondary hyperparathyroidism of renal origin: Secondary | ICD-10-CM | POA: Diagnosis not present

## 2013-02-15 DIAGNOSIS — Z23 Encounter for immunization: Secondary | ICD-10-CM | POA: Diagnosis not present

## 2013-02-15 DIAGNOSIS — N2581 Secondary hyperparathyroidism of renal origin: Secondary | ICD-10-CM | POA: Diagnosis not present

## 2013-02-15 DIAGNOSIS — D631 Anemia in chronic kidney disease: Secondary | ICD-10-CM | POA: Diagnosis not present

## 2013-02-15 DIAGNOSIS — N186 End stage renal disease: Secondary | ICD-10-CM | POA: Diagnosis not present

## 2013-02-15 DIAGNOSIS — D509 Iron deficiency anemia, unspecified: Secondary | ICD-10-CM | POA: Diagnosis not present

## 2013-02-17 DIAGNOSIS — N186 End stage renal disease: Secondary | ICD-10-CM | POA: Diagnosis not present

## 2013-02-17 DIAGNOSIS — D509 Iron deficiency anemia, unspecified: Secondary | ICD-10-CM | POA: Diagnosis not present

## 2013-02-17 DIAGNOSIS — D631 Anemia in chronic kidney disease: Secondary | ICD-10-CM | POA: Diagnosis not present

## 2013-02-17 DIAGNOSIS — Z23 Encounter for immunization: Secondary | ICD-10-CM | POA: Diagnosis not present

## 2013-02-17 DIAGNOSIS — N2581 Secondary hyperparathyroidism of renal origin: Secondary | ICD-10-CM | POA: Diagnosis not present

## 2013-02-19 ENCOUNTER — Telehealth: Payer: Self-pay | Admitting: Family Medicine

## 2013-02-19 NOTE — Telephone Encounter (Signed)
Norco 5-325 mg 1 q6 hours prn

## 2013-02-19 NOTE — Telephone Encounter (Signed)
?   Ok to refill, last refill 01/18/13

## 2013-02-19 NOTE — Telephone Encounter (Signed)
Okay to refill? 

## 2013-02-20 DIAGNOSIS — N2581 Secondary hyperparathyroidism of renal origin: Secondary | ICD-10-CM | POA: Diagnosis not present

## 2013-02-20 DIAGNOSIS — Z23 Encounter for immunization: Secondary | ICD-10-CM | POA: Diagnosis not present

## 2013-02-20 DIAGNOSIS — D631 Anemia in chronic kidney disease: Secondary | ICD-10-CM | POA: Diagnosis not present

## 2013-02-20 DIAGNOSIS — D509 Iron deficiency anemia, unspecified: Secondary | ICD-10-CM | POA: Diagnosis not present

## 2013-02-20 DIAGNOSIS — N186 End stage renal disease: Secondary | ICD-10-CM | POA: Diagnosis not present

## 2013-02-20 MED ORDER — HYDROCODONE-ACETAMINOPHEN 5-325 MG PO TABS: 1.0000 | ORAL_TABLET | Freq: Four times a day (QID) | ORAL | Status: DC | PRN

## 2013-02-20 NOTE — Telephone Encounter (Signed)
Med phoned in °

## 2013-02-21 ENCOUNTER — Telehealth: Payer: Self-pay | Admitting: Family Medicine

## 2013-02-21 MED ORDER — HYDROCODONE-ACETAMINOPHEN 5-325 MG PO TABS: 1.0000 | ORAL_TABLET | Freq: Four times a day (QID) | ORAL | Status: DC | PRN

## 2013-02-21 NOTE — Telephone Encounter (Signed)
Script printed out,signed and ready for pick up

## 2013-02-21 NOTE — Telephone Encounter (Signed)
Pt called the other day for a refill on his Norco. We called it into [facility], but he always comes by and picks up the Rx so he can get it at a local pharmacy. Can we please cancel the one with [facility] and print it out so he can come by to get it today?

## 2013-02-22 DIAGNOSIS — D631 Anemia in chronic kidney disease: Secondary | ICD-10-CM | POA: Diagnosis not present

## 2013-02-22 DIAGNOSIS — D509 Iron deficiency anemia, unspecified: Secondary | ICD-10-CM | POA: Diagnosis not present

## 2013-02-22 DIAGNOSIS — N2581 Secondary hyperparathyroidism of renal origin: Secondary | ICD-10-CM | POA: Diagnosis not present

## 2013-02-22 DIAGNOSIS — N186 End stage renal disease: Secondary | ICD-10-CM | POA: Diagnosis not present

## 2013-02-22 DIAGNOSIS — Z992 Dependence on renal dialysis: Secondary | ICD-10-CM | POA: Diagnosis not present

## 2013-02-24 DIAGNOSIS — D509 Iron deficiency anemia, unspecified: Secondary | ICD-10-CM | POA: Diagnosis not present

## 2013-02-24 DIAGNOSIS — D631 Anemia in chronic kidney disease: Secondary | ICD-10-CM | POA: Diagnosis not present

## 2013-02-24 DIAGNOSIS — N186 End stage renal disease: Secondary | ICD-10-CM | POA: Diagnosis not present

## 2013-02-24 DIAGNOSIS — Z992 Dependence on renal dialysis: Secondary | ICD-10-CM | POA: Diagnosis not present

## 2013-02-24 DIAGNOSIS — N2581 Secondary hyperparathyroidism of renal origin: Secondary | ICD-10-CM | POA: Diagnosis not present

## 2013-02-27 DIAGNOSIS — D509 Iron deficiency anemia, unspecified: Secondary | ICD-10-CM | POA: Diagnosis not present

## 2013-02-27 DIAGNOSIS — N186 End stage renal disease: Secondary | ICD-10-CM | POA: Diagnosis not present

## 2013-02-27 DIAGNOSIS — Z992 Dependence on renal dialysis: Secondary | ICD-10-CM | POA: Diagnosis not present

## 2013-02-27 DIAGNOSIS — D631 Anemia in chronic kidney disease: Secondary | ICD-10-CM | POA: Diagnosis not present

## 2013-02-27 DIAGNOSIS — N2581 Secondary hyperparathyroidism of renal origin: Secondary | ICD-10-CM | POA: Diagnosis not present

## 2013-03-01 DIAGNOSIS — N186 End stage renal disease: Secondary | ICD-10-CM | POA: Diagnosis not present

## 2013-03-01 DIAGNOSIS — D631 Anemia in chronic kidney disease: Secondary | ICD-10-CM | POA: Diagnosis not present

## 2013-03-01 DIAGNOSIS — D509 Iron deficiency anemia, unspecified: Secondary | ICD-10-CM | POA: Diagnosis not present

## 2013-03-01 DIAGNOSIS — Z992 Dependence on renal dialysis: Secondary | ICD-10-CM | POA: Diagnosis not present

## 2013-03-01 DIAGNOSIS — N2581 Secondary hyperparathyroidism of renal origin: Secondary | ICD-10-CM | POA: Diagnosis not present

## 2013-03-03 DIAGNOSIS — D631 Anemia in chronic kidney disease: Secondary | ICD-10-CM | POA: Diagnosis not present

## 2013-03-03 DIAGNOSIS — N186 End stage renal disease: Secondary | ICD-10-CM | POA: Diagnosis not present

## 2013-03-03 DIAGNOSIS — N2581 Secondary hyperparathyroidism of renal origin: Secondary | ICD-10-CM | POA: Diagnosis not present

## 2013-03-03 DIAGNOSIS — D509 Iron deficiency anemia, unspecified: Secondary | ICD-10-CM | POA: Diagnosis not present

## 2013-03-03 DIAGNOSIS — Z992 Dependence on renal dialysis: Secondary | ICD-10-CM | POA: Diagnosis not present

## 2013-03-06 DIAGNOSIS — N186 End stage renal disease: Secondary | ICD-10-CM | POA: Diagnosis not present

## 2013-03-06 DIAGNOSIS — D631 Anemia in chronic kidney disease: Secondary | ICD-10-CM | POA: Diagnosis not present

## 2013-03-06 DIAGNOSIS — N2581 Secondary hyperparathyroidism of renal origin: Secondary | ICD-10-CM | POA: Diagnosis not present

## 2013-03-06 DIAGNOSIS — D509 Iron deficiency anemia, unspecified: Secondary | ICD-10-CM | POA: Diagnosis not present

## 2013-03-06 DIAGNOSIS — Z992 Dependence on renal dialysis: Secondary | ICD-10-CM | POA: Diagnosis not present

## 2013-03-08 DIAGNOSIS — D509 Iron deficiency anemia, unspecified: Secondary | ICD-10-CM | POA: Diagnosis not present

## 2013-03-08 DIAGNOSIS — Z992 Dependence on renal dialysis: Secondary | ICD-10-CM | POA: Diagnosis not present

## 2013-03-08 DIAGNOSIS — N2581 Secondary hyperparathyroidism of renal origin: Secondary | ICD-10-CM | POA: Diagnosis not present

## 2013-03-08 DIAGNOSIS — N186 End stage renal disease: Secondary | ICD-10-CM | POA: Diagnosis not present

## 2013-03-08 DIAGNOSIS — D631 Anemia in chronic kidney disease: Secondary | ICD-10-CM | POA: Diagnosis not present

## 2013-03-10 DIAGNOSIS — Z992 Dependence on renal dialysis: Secondary | ICD-10-CM | POA: Diagnosis not present

## 2013-03-10 DIAGNOSIS — D631 Anemia in chronic kidney disease: Secondary | ICD-10-CM | POA: Diagnosis not present

## 2013-03-10 DIAGNOSIS — D509 Iron deficiency anemia, unspecified: Secondary | ICD-10-CM | POA: Diagnosis not present

## 2013-03-10 DIAGNOSIS — N186 End stage renal disease: Secondary | ICD-10-CM | POA: Diagnosis not present

## 2013-03-10 DIAGNOSIS — N2581 Secondary hyperparathyroidism of renal origin: Secondary | ICD-10-CM | POA: Diagnosis not present

## 2013-03-13 DIAGNOSIS — D509 Iron deficiency anemia, unspecified: Secondary | ICD-10-CM | POA: Diagnosis not present

## 2013-03-13 DIAGNOSIS — D631 Anemia in chronic kidney disease: Secondary | ICD-10-CM | POA: Diagnosis not present

## 2013-03-13 DIAGNOSIS — N2581 Secondary hyperparathyroidism of renal origin: Secondary | ICD-10-CM | POA: Diagnosis not present

## 2013-03-13 DIAGNOSIS — Z992 Dependence on renal dialysis: Secondary | ICD-10-CM | POA: Diagnosis not present

## 2013-03-13 DIAGNOSIS — N186 End stage renal disease: Secondary | ICD-10-CM | POA: Diagnosis not present

## 2013-03-15 DIAGNOSIS — Z992 Dependence on renal dialysis: Secondary | ICD-10-CM | POA: Diagnosis not present

## 2013-03-15 DIAGNOSIS — D631 Anemia in chronic kidney disease: Secondary | ICD-10-CM | POA: Diagnosis not present

## 2013-03-15 DIAGNOSIS — N186 End stage renal disease: Secondary | ICD-10-CM | POA: Diagnosis not present

## 2013-03-15 DIAGNOSIS — N2581 Secondary hyperparathyroidism of renal origin: Secondary | ICD-10-CM | POA: Diagnosis not present

## 2013-03-15 DIAGNOSIS — D509 Iron deficiency anemia, unspecified: Secondary | ICD-10-CM | POA: Diagnosis not present

## 2013-03-17 DIAGNOSIS — Z992 Dependence on renal dialysis: Secondary | ICD-10-CM | POA: Diagnosis not present

## 2013-03-17 DIAGNOSIS — N2581 Secondary hyperparathyroidism of renal origin: Secondary | ICD-10-CM | POA: Diagnosis not present

## 2013-03-17 DIAGNOSIS — D631 Anemia in chronic kidney disease: Secondary | ICD-10-CM | POA: Diagnosis not present

## 2013-03-17 DIAGNOSIS — N186 End stage renal disease: Secondary | ICD-10-CM | POA: Diagnosis not present

## 2013-03-17 DIAGNOSIS — D509 Iron deficiency anemia, unspecified: Secondary | ICD-10-CM | POA: Diagnosis not present

## 2013-03-19 DIAGNOSIS — F17200 Nicotine dependence, unspecified, uncomplicated: Secondary | ICD-10-CM | POA: Diagnosis not present

## 2013-03-19 DIAGNOSIS — J449 Chronic obstructive pulmonary disease, unspecified: Secondary | ICD-10-CM | POA: Diagnosis not present

## 2013-03-19 DIAGNOSIS — N19 Unspecified kidney failure: Secondary | ICD-10-CM | POA: Diagnosis not present

## 2013-03-19 DIAGNOSIS — I10 Essential (primary) hypertension: Secondary | ICD-10-CM | POA: Diagnosis not present

## 2013-03-20 DIAGNOSIS — D509 Iron deficiency anemia, unspecified: Secondary | ICD-10-CM | POA: Diagnosis not present

## 2013-03-20 DIAGNOSIS — D631 Anemia in chronic kidney disease: Secondary | ICD-10-CM | POA: Diagnosis not present

## 2013-03-20 DIAGNOSIS — N186 End stage renal disease: Secondary | ICD-10-CM | POA: Diagnosis not present

## 2013-03-20 DIAGNOSIS — N2581 Secondary hyperparathyroidism of renal origin: Secondary | ICD-10-CM | POA: Diagnosis not present

## 2013-03-20 DIAGNOSIS — Z992 Dependence on renal dialysis: Secondary | ICD-10-CM | POA: Diagnosis not present

## 2013-03-22 DIAGNOSIS — D631 Anemia in chronic kidney disease: Secondary | ICD-10-CM | POA: Diagnosis not present

## 2013-03-22 DIAGNOSIS — D509 Iron deficiency anemia, unspecified: Secondary | ICD-10-CM | POA: Diagnosis not present

## 2013-03-22 DIAGNOSIS — N186 End stage renal disease: Secondary | ICD-10-CM | POA: Diagnosis not present

## 2013-03-22 DIAGNOSIS — Z992 Dependence on renal dialysis: Secondary | ICD-10-CM | POA: Diagnosis not present

## 2013-03-22 DIAGNOSIS — N2581 Secondary hyperparathyroidism of renal origin: Secondary | ICD-10-CM | POA: Diagnosis not present

## 2013-03-23 DIAGNOSIS — N186 End stage renal disease: Secondary | ICD-10-CM | POA: Diagnosis not present

## 2013-03-24 DIAGNOSIS — N186 End stage renal disease: Secondary | ICD-10-CM | POA: Diagnosis not present

## 2013-03-24 DIAGNOSIS — D631 Anemia in chronic kidney disease: Secondary | ICD-10-CM | POA: Diagnosis not present

## 2013-03-24 DIAGNOSIS — D509 Iron deficiency anemia, unspecified: Secondary | ICD-10-CM | POA: Diagnosis not present

## 2013-03-24 DIAGNOSIS — N2581 Secondary hyperparathyroidism of renal origin: Secondary | ICD-10-CM | POA: Diagnosis not present

## 2013-03-29 DIAGNOSIS — N186 End stage renal disease: Secondary | ICD-10-CM | POA: Diagnosis not present

## 2013-04-11 ENCOUNTER — Telehealth: Payer: Self-pay | Admitting: *Deleted

## 2013-04-11 ENCOUNTER — Other Ambulatory Visit: Payer: Self-pay | Admitting: *Deleted

## 2013-04-11 MED ORDER — CLONIDINE HCL 0.3 MG PO TABS: ORAL_TABLET | ORAL | Status: DC

## 2013-04-11 MED ORDER — LABETALOL HCL 200 MG PO TABS: ORAL_TABLET | ORAL | Status: DC

## 2013-04-11 MED ORDER — AMLODIPINE BESYLATE 10 MG PO TABS: ORAL_TABLET | ORAL | Status: DC

## 2013-04-11 MED ORDER — HYDRALAZINE HCL 50 MG PO TABS: ORAL_TABLET | ORAL | Status: DC

## 2013-04-11 NOTE — Telephone Encounter (Signed)
Labetalol 200 mg #180;clonidine 0.3mg  #270  Med refjilled

## 2013-04-11 NOTE — Telephone Encounter (Signed)
Med refilled.

## 2013-04-22 DIAGNOSIS — N186 End stage renal disease: Secondary | ICD-10-CM | POA: Diagnosis not present

## 2013-04-24 DIAGNOSIS — N186 End stage renal disease: Secondary | ICD-10-CM | POA: Diagnosis not present

## 2013-04-24 DIAGNOSIS — N2581 Secondary hyperparathyroidism of renal origin: Secondary | ICD-10-CM | POA: Diagnosis not present

## 2013-04-24 DIAGNOSIS — D631 Anemia in chronic kidney disease: Secondary | ICD-10-CM | POA: Diagnosis not present

## 2013-04-24 DIAGNOSIS — D509 Iron deficiency anemia, unspecified: Secondary | ICD-10-CM | POA: Diagnosis not present

## 2013-04-26 DIAGNOSIS — N186 End stage renal disease: Secondary | ICD-10-CM | POA: Diagnosis not present

## 2013-05-23 DIAGNOSIS — N186 End stage renal disease: Secondary | ICD-10-CM | POA: Diagnosis not present

## 2013-05-24 DIAGNOSIS — D509 Iron deficiency anemia, unspecified: Secondary | ICD-10-CM | POA: Diagnosis not present

## 2013-05-24 DIAGNOSIS — N2581 Secondary hyperparathyroidism of renal origin: Secondary | ICD-10-CM | POA: Diagnosis not present

## 2013-05-24 DIAGNOSIS — D631 Anemia in chronic kidney disease: Secondary | ICD-10-CM | POA: Diagnosis not present

## 2013-05-24 DIAGNOSIS — N039 Chronic nephritic syndrome with unspecified morphologic changes: Secondary | ICD-10-CM | POA: Diagnosis not present

## 2013-05-24 DIAGNOSIS — N186 End stage renal disease: Secondary | ICD-10-CM | POA: Diagnosis not present

## 2013-05-31 DIAGNOSIS — N186 End stage renal disease: Secondary | ICD-10-CM | POA: Diagnosis not present

## 2013-06-04 ENCOUNTER — Ambulatory Visit: Payer: Medicare Other | Admitting: Family Medicine

## 2013-06-05 ENCOUNTER — Telehealth: Payer: Self-pay | Admitting: *Deleted

## 2013-06-05 MED ORDER — BUDESONIDE-FORMOTEROL FUMARATE 80-4.5 MCG/ACT IN AERO: 2.0000 | INHALATION_SPRAY | Freq: Two times a day (BID) | RESPIRATORY_TRACT | Status: DC

## 2013-06-05 NOTE — Telephone Encounter (Signed)
Meds refilled.

## 2013-06-20 DIAGNOSIS — F17200 Nicotine dependence, unspecified, uncomplicated: Secondary | ICD-10-CM | POA: Diagnosis not present

## 2013-06-20 DIAGNOSIS — I10 Essential (primary) hypertension: Secondary | ICD-10-CM | POA: Diagnosis not present

## 2013-06-20 DIAGNOSIS — J449 Chronic obstructive pulmonary disease, unspecified: Secondary | ICD-10-CM | POA: Diagnosis not present

## 2013-06-20 DIAGNOSIS — N19 Unspecified kidney failure: Secondary | ICD-10-CM | POA: Diagnosis not present

## 2013-06-23 DIAGNOSIS — N186 End stage renal disease: Secondary | ICD-10-CM | POA: Diagnosis not present

## 2013-06-26 DIAGNOSIS — N2581 Secondary hyperparathyroidism of renal origin: Secondary | ICD-10-CM | POA: Diagnosis not present

## 2013-06-26 DIAGNOSIS — N186 End stage renal disease: Secondary | ICD-10-CM | POA: Diagnosis not present

## 2013-06-26 DIAGNOSIS — D631 Anemia in chronic kidney disease: Secondary | ICD-10-CM | POA: Diagnosis not present

## 2013-06-26 DIAGNOSIS — N039 Chronic nephritic syndrome with unspecified morphologic changes: Secondary | ICD-10-CM | POA: Diagnosis not present

## 2013-06-26 DIAGNOSIS — D509 Iron deficiency anemia, unspecified: Secondary | ICD-10-CM | POA: Diagnosis not present

## 2013-06-28 DIAGNOSIS — N186 End stage renal disease: Secondary | ICD-10-CM | POA: Diagnosis not present

## 2013-07-06 ENCOUNTER — Other Ambulatory Visit: Payer: Self-pay | Admitting: Family Medicine

## 2013-07-06 ENCOUNTER — Encounter: Payer: Self-pay | Admitting: Family Medicine

## 2013-07-06 NOTE — Telephone Encounter (Signed)
Medication refill for one time only.  Patient needs to be seen.  Letter sent for patient to call and schedule 

## 2013-07-21 DIAGNOSIS — N186 End stage renal disease: Secondary | ICD-10-CM | POA: Diagnosis not present

## 2013-07-24 DIAGNOSIS — D631 Anemia in chronic kidney disease: Secondary | ICD-10-CM | POA: Diagnosis not present

## 2013-07-24 DIAGNOSIS — N186 End stage renal disease: Secondary | ICD-10-CM | POA: Diagnosis not present

## 2013-07-24 DIAGNOSIS — D509 Iron deficiency anemia, unspecified: Secondary | ICD-10-CM | POA: Diagnosis not present

## 2013-07-24 DIAGNOSIS — N2581 Secondary hyperparathyroidism of renal origin: Secondary | ICD-10-CM | POA: Diagnosis not present

## 2013-07-24 DIAGNOSIS — Z992 Dependence on renal dialysis: Secondary | ICD-10-CM | POA: Diagnosis not present

## 2013-07-30 DIAGNOSIS — T82898A Other specified complication of vascular prosthetic devices, implants and grafts, initial encounter: Secondary | ICD-10-CM | POA: Diagnosis not present

## 2013-07-30 DIAGNOSIS — N186 End stage renal disease: Secondary | ICD-10-CM | POA: Diagnosis not present

## 2013-08-21 DIAGNOSIS — N186 End stage renal disease: Secondary | ICD-10-CM | POA: Diagnosis not present

## 2013-08-23 DIAGNOSIS — D509 Iron deficiency anemia, unspecified: Secondary | ICD-10-CM | POA: Diagnosis not present

## 2013-08-23 DIAGNOSIS — N2581 Secondary hyperparathyroidism of renal origin: Secondary | ICD-10-CM | POA: Diagnosis not present

## 2013-08-23 DIAGNOSIS — D631 Anemia in chronic kidney disease: Secondary | ICD-10-CM | POA: Diagnosis not present

## 2013-08-23 DIAGNOSIS — N039 Chronic nephritic syndrome with unspecified morphologic changes: Secondary | ICD-10-CM | POA: Diagnosis not present

## 2013-08-23 DIAGNOSIS — N186 End stage renal disease: Secondary | ICD-10-CM | POA: Diagnosis not present

## 2013-08-30 DIAGNOSIS — N186 End stage renal disease: Secondary | ICD-10-CM | POA: Diagnosis not present

## 2013-09-20 DIAGNOSIS — N186 End stage renal disease: Secondary | ICD-10-CM | POA: Diagnosis not present

## 2013-09-22 DIAGNOSIS — D631 Anemia in chronic kidney disease: Secondary | ICD-10-CM | POA: Diagnosis not present

## 2013-09-22 DIAGNOSIS — D509 Iron deficiency anemia, unspecified: Secondary | ICD-10-CM | POA: Diagnosis not present

## 2013-09-22 DIAGNOSIS — N039 Chronic nephritic syndrome with unspecified morphologic changes: Secondary | ICD-10-CM | POA: Diagnosis not present

## 2013-09-22 DIAGNOSIS — N2581 Secondary hyperparathyroidism of renal origin: Secondary | ICD-10-CM | POA: Diagnosis not present

## 2013-09-22 DIAGNOSIS — N186 End stage renal disease: Secondary | ICD-10-CM | POA: Diagnosis not present

## 2013-09-27 DIAGNOSIS — N186 End stage renal disease: Secondary | ICD-10-CM | POA: Diagnosis not present

## 2013-10-21 DIAGNOSIS — N186 End stage renal disease: Secondary | ICD-10-CM | POA: Diagnosis not present

## 2013-10-23 DIAGNOSIS — D631 Anemia in chronic kidney disease: Secondary | ICD-10-CM | POA: Diagnosis not present

## 2013-10-23 DIAGNOSIS — D509 Iron deficiency anemia, unspecified: Secondary | ICD-10-CM | POA: Diagnosis not present

## 2013-10-23 DIAGNOSIS — N186 End stage renal disease: Secondary | ICD-10-CM | POA: Diagnosis not present

## 2013-10-23 DIAGNOSIS — N2581 Secondary hyperparathyroidism of renal origin: Secondary | ICD-10-CM | POA: Diagnosis not present

## 2013-10-25 DIAGNOSIS — N186 End stage renal disease: Secondary | ICD-10-CM | POA: Diagnosis not present

## 2013-11-20 DIAGNOSIS — N186 End stage renal disease: Secondary | ICD-10-CM | POA: Diagnosis not present

## 2013-11-22 DIAGNOSIS — D631 Anemia in chronic kidney disease: Secondary | ICD-10-CM | POA: Diagnosis not present

## 2013-11-22 DIAGNOSIS — N2581 Secondary hyperparathyroidism of renal origin: Secondary | ICD-10-CM | POA: Diagnosis not present

## 2013-11-22 DIAGNOSIS — D509 Iron deficiency anemia, unspecified: Secondary | ICD-10-CM | POA: Diagnosis not present

## 2013-11-22 DIAGNOSIS — N039 Chronic nephritic syndrome with unspecified morphologic changes: Secondary | ICD-10-CM | POA: Diagnosis not present

## 2013-11-22 DIAGNOSIS — N186 End stage renal disease: Secondary | ICD-10-CM | POA: Diagnosis not present

## 2013-11-29 DIAGNOSIS — N186 End stage renal disease: Secondary | ICD-10-CM | POA: Diagnosis not present

## 2013-12-17 DIAGNOSIS — Z01810 Encounter for preprocedural cardiovascular examination: Secondary | ICD-10-CM | POA: Diagnosis not present

## 2013-12-19 DIAGNOSIS — Z0000 Encounter for general adult medical examination without abnormal findings: Secondary | ICD-10-CM | POA: Diagnosis not present

## 2013-12-19 DIAGNOSIS — F17200 Nicotine dependence, unspecified, uncomplicated: Secondary | ICD-10-CM | POA: Diagnosis not present

## 2013-12-19 DIAGNOSIS — N19 Unspecified kidney failure: Secondary | ICD-10-CM | POA: Diagnosis not present

## 2013-12-19 DIAGNOSIS — I10 Essential (primary) hypertension: Secondary | ICD-10-CM | POA: Diagnosis not present

## 2013-12-19 DIAGNOSIS — M549 Dorsalgia, unspecified: Secondary | ICD-10-CM | POA: Diagnosis not present

## 2013-12-21 DIAGNOSIS — N186 End stage renal disease: Secondary | ICD-10-CM | POA: Diagnosis not present

## 2013-12-22 DIAGNOSIS — N2581 Secondary hyperparathyroidism of renal origin: Secondary | ICD-10-CM | POA: Diagnosis not present

## 2013-12-22 DIAGNOSIS — D631 Anemia in chronic kidney disease: Secondary | ICD-10-CM | POA: Diagnosis not present

## 2013-12-22 DIAGNOSIS — N186 End stage renal disease: Secondary | ICD-10-CM | POA: Diagnosis not present

## 2013-12-22 DIAGNOSIS — D509 Iron deficiency anemia, unspecified: Secondary | ICD-10-CM | POA: Diagnosis not present

## 2013-12-24 DIAGNOSIS — F17200 Nicotine dependence, unspecified, uncomplicated: Secondary | ICD-10-CM | POA: Diagnosis not present

## 2013-12-24 DIAGNOSIS — I10 Essential (primary) hypertension: Secondary | ICD-10-CM | POA: Diagnosis not present

## 2013-12-25 DIAGNOSIS — D631 Anemia in chronic kidney disease: Secondary | ICD-10-CM | POA: Diagnosis not present

## 2013-12-25 DIAGNOSIS — D509 Iron deficiency anemia, unspecified: Secondary | ICD-10-CM | POA: Diagnosis not present

## 2013-12-25 DIAGNOSIS — N186 End stage renal disease: Secondary | ICD-10-CM | POA: Diagnosis not present

## 2013-12-25 DIAGNOSIS — N2581 Secondary hyperparathyroidism of renal origin: Secondary | ICD-10-CM | POA: Diagnosis not present

## 2013-12-29 DIAGNOSIS — D631 Anemia in chronic kidney disease: Secondary | ICD-10-CM | POA: Diagnosis not present

## 2013-12-29 DIAGNOSIS — D509 Iron deficiency anemia, unspecified: Secondary | ICD-10-CM | POA: Diagnosis not present

## 2013-12-29 DIAGNOSIS — N186 End stage renal disease: Secondary | ICD-10-CM | POA: Diagnosis not present

## 2013-12-29 DIAGNOSIS — N2581 Secondary hyperparathyroidism of renal origin: Secondary | ICD-10-CM | POA: Diagnosis not present

## 2014-01-01 ENCOUNTER — Encounter: Payer: Self-pay | Admitting: Internal Medicine

## 2014-01-01 DIAGNOSIS — N186 End stage renal disease: Secondary | ICD-10-CM | POA: Diagnosis not present

## 2014-01-01 DIAGNOSIS — Z1211 Encounter for screening for malignant neoplasm of colon: Secondary | ICD-10-CM | POA: Diagnosis not present

## 2014-01-01 DIAGNOSIS — D509 Iron deficiency anemia, unspecified: Secondary | ICD-10-CM | POA: Diagnosis not present

## 2014-01-01 DIAGNOSIS — D631 Anemia in chronic kidney disease: Secondary | ICD-10-CM | POA: Diagnosis not present

## 2014-01-01 DIAGNOSIS — N2581 Secondary hyperparathyroidism of renal origin: Secondary | ICD-10-CM | POA: Diagnosis not present

## 2014-01-03 DIAGNOSIS — N039 Chronic nephritic syndrome with unspecified morphologic changes: Secondary | ICD-10-CM | POA: Diagnosis not present

## 2014-01-03 DIAGNOSIS — N186 End stage renal disease: Secondary | ICD-10-CM | POA: Diagnosis not present

## 2014-01-03 DIAGNOSIS — N2581 Secondary hyperparathyroidism of renal origin: Secondary | ICD-10-CM | POA: Diagnosis not present

## 2014-01-03 DIAGNOSIS — D631 Anemia in chronic kidney disease: Secondary | ICD-10-CM | POA: Diagnosis not present

## 2014-01-03 DIAGNOSIS — D509 Iron deficiency anemia, unspecified: Secondary | ICD-10-CM | POA: Diagnosis not present

## 2014-01-05 DIAGNOSIS — N186 End stage renal disease: Secondary | ICD-10-CM | POA: Diagnosis not present

## 2014-01-05 DIAGNOSIS — D509 Iron deficiency anemia, unspecified: Secondary | ICD-10-CM | POA: Diagnosis not present

## 2014-01-05 DIAGNOSIS — D631 Anemia in chronic kidney disease: Secondary | ICD-10-CM | POA: Diagnosis not present

## 2014-01-05 DIAGNOSIS — N2581 Secondary hyperparathyroidism of renal origin: Secondary | ICD-10-CM | POA: Diagnosis not present

## 2014-01-07 DIAGNOSIS — J984 Other disorders of lung: Secondary | ICD-10-CM | POA: Diagnosis not present

## 2014-01-08 DIAGNOSIS — N2581 Secondary hyperparathyroidism of renal origin: Secondary | ICD-10-CM | POA: Diagnosis not present

## 2014-01-08 DIAGNOSIS — D509 Iron deficiency anemia, unspecified: Secondary | ICD-10-CM | POA: Diagnosis not present

## 2014-01-08 DIAGNOSIS — N186 End stage renal disease: Secondary | ICD-10-CM | POA: Diagnosis not present

## 2014-01-08 DIAGNOSIS — D631 Anemia in chronic kidney disease: Secondary | ICD-10-CM | POA: Diagnosis not present

## 2014-01-08 DIAGNOSIS — N039 Chronic nephritic syndrome with unspecified morphologic changes: Secondary | ICD-10-CM | POA: Diagnosis not present

## 2014-01-10 DIAGNOSIS — N2581 Secondary hyperparathyroidism of renal origin: Secondary | ICD-10-CM | POA: Diagnosis not present

## 2014-01-10 DIAGNOSIS — N186 End stage renal disease: Secondary | ICD-10-CM | POA: Diagnosis not present

## 2014-01-10 DIAGNOSIS — D509 Iron deficiency anemia, unspecified: Secondary | ICD-10-CM | POA: Diagnosis not present

## 2014-01-10 DIAGNOSIS — D631 Anemia in chronic kidney disease: Secondary | ICD-10-CM | POA: Diagnosis not present

## 2014-01-12 DIAGNOSIS — N186 End stage renal disease: Secondary | ICD-10-CM | POA: Diagnosis not present

## 2014-01-12 DIAGNOSIS — D509 Iron deficiency anemia, unspecified: Secondary | ICD-10-CM | POA: Diagnosis not present

## 2014-01-12 DIAGNOSIS — N2581 Secondary hyperparathyroidism of renal origin: Secondary | ICD-10-CM | POA: Diagnosis not present

## 2014-01-12 DIAGNOSIS — D631 Anemia in chronic kidney disease: Secondary | ICD-10-CM | POA: Diagnosis not present

## 2014-01-14 ENCOUNTER — Encounter (INDEPENDENT_AMBULATORY_CARE_PROVIDER_SITE_OTHER): Payer: Self-pay

## 2014-01-14 ENCOUNTER — Other Ambulatory Visit: Payer: Self-pay | Admitting: Internal Medicine

## 2014-01-14 ENCOUNTER — Encounter: Payer: Self-pay | Admitting: Gastroenterology

## 2014-01-14 ENCOUNTER — Ambulatory Visit (INDEPENDENT_AMBULATORY_CARE_PROVIDER_SITE_OTHER): Payer: Medicare Other | Admitting: Gastroenterology

## 2014-01-14 VITALS — BP 138/84 | HR 75 | Temp 98.1°F | Ht 72.0 in | Wt 275.2 lb

## 2014-01-14 DIAGNOSIS — R195 Other fecal abnormalities: Secondary | ICD-10-CM

## 2014-01-14 MED ORDER — PEG 3350-KCL-NA BICARB-NACL 420 G PO SOLR: 4000.0000 mL | ORAL | Status: DC

## 2014-01-14 NOTE — Patient Instructions (Signed)
We have scheduled you for a colonoscopy with Dr. Rourk in the near future.  Further recommendations to follow!   

## 2014-01-14 NOTE — Progress Notes (Signed)
Primary Care Physician:  Vic Blackbird, MD Primary Gastroenterologist:  Dr. Gala Romney   Chief Complaint  Patient presents with  . Blood In Stools    HPI:   43 year old with heme positive stool presents today to discuss initial colonoscopy. No hematochezia or melena. No significant changes in bowel habits. Rare constipation if at all. No abdominal pain. No GERD, no dysphagia. No N/V. No lack of appetite or weight loss. No prior colonoscopy.   Past Medical History  Diagnosis Date  . Hypertension     h/o hypertensive crisis with encephalopathy  . Chronic obstructive pulmonary disease     with asthmatic component  . Anemia     Attributed to chronic disease/chronic kidney disease  . Chronic diastolic heart failure [id number]    EF 35% by echo in 2011; echo in 08/2011-normal EF, moderate to severe LVH; BNP level of [phone] in 2011-12  . ESRD (end stage renal disease) on dialysis     secondary hyperparathyroidism  . Obesity   . Tobacco abuse     Approximate consumption of 25 pack years; continuing at 0.5 pack per day    Past Surgical History  Procedure Laterality Date  . Arteriovenous graft placement  2011    Current Outpatient Prescriptions  Medication Sig Dispense Refill  . albuterol (PROAIR HFA) 108 (90 BASE) MCG/ACT inhaler Inhale 2 puffs into the lungs every 4 (four) hours as needed for wheezing.  3 Inhaler  3  . amLODipine (NORVASC) 10 MG tablet TAKE 1 TABLET EVERY DAY  30 tablet  0  . aspirin 325 MG tablet Take 325 mg by mouth daily.      [name] b complex-vitamin c-folic acid (NEPHRO-VITE) 0.8 MG TABS Take 0.8 mg by mouth at bedtime.      . budesonide-formoterol (SYMBICORT) 80-4.5 MCG/ACT inhaler Inhale 2 puffs into the lungs 2 (two) times daily.  1 Inhaler  12  . cloNIDine (CATAPRES) 0.3 MG tablet TAKE 1 TABLET THREE TIMES DAILY  90 tablet  0  . hydrALAZINE (APRESOLINE) 50 MG tablet TAKE 1 TABLET THREE TIMES DAILY  270 tablet  PRN  . HYDROcodone-acetaminophen (NORCO/VICODIN)  5-325 MG per tablet Take 1 tablet by mouth every 6 (six) hours as needed for pain.  90 tablet  0  . labetalol (NORMODYNE) 200 MG tablet TAKE 1 TABLET TWICE DAILY  60 tablet  0  . SENSIPAR 30 MG tablet Take 30 mg by mouth daily with breakfast.       . sevelamer (RENVELA) 800 MG tablet Take 800 mg by mouth 3 (three) times daily with meals.      . traZODone (DESYREL) 50 MG tablet Take 1-2 tablet at bedtime as needed  60 tablet  0  . albuterol (PROVENTIL,VENTOLIN) 90 MCG/ACT inhaler Inhale 2 puffs into the lungs every 4 (four) hours as needed for wheezing.  17 g  3   No current facility-administered medications for this visit.    Allergies as of 01/14/2014  . (No Known Allergies)    Family History  Problem Relation Age of Onset  . Heart disease Father   . Diabetes Sister   . Hypertension Sister   . Diabetes Sister   . Hypertension Sister   . Kidney disease Mother   . Hypertension Mother   . Hypertension Sister   . Hypertension Brother   . Hypertension Brother   . Hypertension Brother   . Colon cancer Neg Hx     History   Social History  .  Marital Status: Single    Spouse Name: N/A    Number of Children: 3  . Years of Education: N/A   Occupational History  . Disability    Social History Main Topics  . Smoking status: Current Every Day Smoker -- 0.50 packs/day for 25 years    Types: Cigarettes  . Smokeless tobacco: Not on file  . Alcohol Use: No     Comment: quit etoh about 2011. used to drink about 8-10 beers a week  . Drug Use: No  . Sexual Activity: Not on file   Other Topics Concern  . Not on file   Social History Narrative  . No narrative on file    Review of Systems: Gen: see HPI CV: Denies chest pain, heart palpitations, peripheral edema, syncope.  Resp: Denies shortness of breath at rest or with exertion. Denies wheezing or cough.  GI: see HPI GU : decreased urinary output, on dialysis MS: +joint pain Derm: Denies rash, itching, dry skin Psych:  Denies depression, anxiety, memory loss, and confusion Heme: Denies bruising, bleeding, and enlarged lymph nodes.  Physical Exam: BP 138/84  Pulse 75  Temp(Src) 98.1 F (36.7 C) (Oral)  Ht 6' (1.829 m)  Wt 275 lb 3.2 oz (124.83 kg)  BMI 37.32 kg/m2 General:   Alert and oriented. Pleasant and cooperative. Well-nourished and well-developed.  Head:  Normocephalic and atraumatic. Eyes:  Without icterus, sclera clear and conjunctiva pink.  Ears:  Normal auditory acuity. Nose:  No deformity, discharge,  or lesions. Mouth:  No deformity or lesions, oral mucosa pink.  Neck:  Supple, without mass or thyromegaly. Lungs:  Clear to auscultation bilaterally. No wheezes, rales, or rhonchi. No distress.  Heart:  S1, S2 present without murmurs appreciated.  Abdomen:  +BS, soft, non-tender and non-distended. No HSM noted. No guarding or rebound. Query small umbilical hernia. Rectal:  Deferred  Msk:  Symmetrical without gross deformities. Normal posture. Extremities:  Without clubbing or edema. Neurologic:  Alert and  oriented x4;  grossly normal neurologically. Skin:  Intact without significant lesions or rashes. Cervical Nodes:  No significant cervical adenopathy. Psych:  Alert and cooperative. Normal mood and affect.

## 2014-01-15 DIAGNOSIS — N2581 Secondary hyperparathyroidism of renal origin: Secondary | ICD-10-CM | POA: Diagnosis not present

## 2014-01-15 DIAGNOSIS — D631 Anemia in chronic kidney disease: Secondary | ICD-10-CM | POA: Diagnosis not present

## 2014-01-15 DIAGNOSIS — N186 End stage renal disease: Secondary | ICD-10-CM | POA: Diagnosis not present

## 2014-01-15 DIAGNOSIS — D509 Iron deficiency anemia, unspecified: Secondary | ICD-10-CM | POA: Diagnosis not present

## 2014-01-16 ENCOUNTER — Encounter (HOSPITAL_COMMUNITY): Payer: Self-pay | Admitting: Pharmacy Technician

## 2014-01-17 DIAGNOSIS — D631 Anemia in chronic kidney disease: Secondary | ICD-10-CM | POA: Diagnosis not present

## 2014-01-17 DIAGNOSIS — N186 End stage renal disease: Secondary | ICD-10-CM | POA: Diagnosis not present

## 2014-01-17 DIAGNOSIS — D509 Iron deficiency anemia, unspecified: Secondary | ICD-10-CM | POA: Diagnosis not present

## 2014-01-17 DIAGNOSIS — N2581 Secondary hyperparathyroidism of renal origin: Secondary | ICD-10-CM | POA: Diagnosis not present

## 2014-01-17 NOTE — Assessment & Plan Note (Addendum)
43 year old male with recent heme positive stool but without overt signs of GI bleeding or other concerning GI symptoms. Likely benign anorectal source but needs initial colonoscopy now.  Proceed with TCS with Dr. Gala Romney in near future: the risks, benefits, and alternatives have been discussed with the patient in detail. The patient states understanding and desires to proceed.  Addendum: due to history of ETOH abuse in past, will give Phenergan 25 mg IV on call at time of procedure.

## 2014-01-18 DIAGNOSIS — D631 Anemia in chronic kidney disease: Secondary | ICD-10-CM | POA: Diagnosis not present

## 2014-01-18 DIAGNOSIS — D509 Iron deficiency anemia, unspecified: Secondary | ICD-10-CM | POA: Diagnosis not present

## 2014-01-18 DIAGNOSIS — N2581 Secondary hyperparathyroidism of renal origin: Secondary | ICD-10-CM | POA: Diagnosis not present

## 2014-01-18 DIAGNOSIS — N039 Chronic nephritic syndrome with unspecified morphologic changes: Secondary | ICD-10-CM | POA: Diagnosis not present

## 2014-01-18 DIAGNOSIS — N186 End stage renal disease: Secondary | ICD-10-CM | POA: Diagnosis not present

## 2014-01-21 DIAGNOSIS — D509 Iron deficiency anemia, unspecified: Secondary | ICD-10-CM | POA: Diagnosis not present

## 2014-01-21 DIAGNOSIS — N186 End stage renal disease: Secondary | ICD-10-CM | POA: Diagnosis not present

## 2014-01-21 DIAGNOSIS — N039 Chronic nephritic syndrome with unspecified morphologic changes: Secondary | ICD-10-CM | POA: Diagnosis not present

## 2014-01-21 DIAGNOSIS — N2581 Secondary hyperparathyroidism of renal origin: Secondary | ICD-10-CM | POA: Diagnosis not present

## 2014-01-21 DIAGNOSIS — D631 Anemia in chronic kidney disease: Secondary | ICD-10-CM | POA: Diagnosis not present

## 2014-01-23 NOTE — Progress Notes (Signed)
Cc to pcp °

## 2014-01-24 DIAGNOSIS — N186 End stage renal disease: Secondary | ICD-10-CM | POA: Diagnosis not present

## 2014-01-24 DIAGNOSIS — D509 Iron deficiency anemia, unspecified: Secondary | ICD-10-CM | POA: Diagnosis not present

## 2014-01-24 DIAGNOSIS — N2581 Secondary hyperparathyroidism of renal origin: Secondary | ICD-10-CM | POA: Diagnosis not present

## 2014-01-24 DIAGNOSIS — N039 Chronic nephritic syndrome with unspecified morphologic changes: Secondary | ICD-10-CM | POA: Diagnosis not present

## 2014-01-24 DIAGNOSIS — D631 Anemia in chronic kidney disease: Secondary | ICD-10-CM | POA: Diagnosis not present

## 2014-01-26 DIAGNOSIS — D509 Iron deficiency anemia, unspecified: Secondary | ICD-10-CM | POA: Diagnosis not present

## 2014-01-26 DIAGNOSIS — D631 Anemia in chronic kidney disease: Secondary | ICD-10-CM | POA: Diagnosis not present

## 2014-01-26 DIAGNOSIS — N2581 Secondary hyperparathyroidism of renal origin: Secondary | ICD-10-CM | POA: Diagnosis not present

## 2014-01-26 DIAGNOSIS — N186 End stage renal disease: Secondary | ICD-10-CM | POA: Diagnosis not present

## 2014-01-29 DIAGNOSIS — D509 Iron deficiency anemia, unspecified: Secondary | ICD-10-CM | POA: Diagnosis not present

## 2014-01-29 DIAGNOSIS — D631 Anemia in chronic kidney disease: Secondary | ICD-10-CM | POA: Diagnosis not present

## 2014-01-29 DIAGNOSIS — N186 End stage renal disease: Secondary | ICD-10-CM | POA: Diagnosis not present

## 2014-01-29 DIAGNOSIS — N2581 Secondary hyperparathyroidism of renal origin: Secondary | ICD-10-CM | POA: Diagnosis not present

## 2014-01-31 DIAGNOSIS — D631 Anemia in chronic kidney disease: Secondary | ICD-10-CM | POA: Diagnosis not present

## 2014-01-31 DIAGNOSIS — N039 Chronic nephritic syndrome with unspecified morphologic changes: Secondary | ICD-10-CM | POA: Diagnosis not present

## 2014-01-31 DIAGNOSIS — N2581 Secondary hyperparathyroidism of renal origin: Secondary | ICD-10-CM | POA: Diagnosis not present

## 2014-01-31 DIAGNOSIS — N186 End stage renal disease: Secondary | ICD-10-CM | POA: Diagnosis not present

## 2014-01-31 DIAGNOSIS — D509 Iron deficiency anemia, unspecified: Secondary | ICD-10-CM | POA: Diagnosis not present

## 2014-02-02 DIAGNOSIS — D631 Anemia in chronic kidney disease: Secondary | ICD-10-CM | POA: Diagnosis not present

## 2014-02-02 DIAGNOSIS — D509 Iron deficiency anemia, unspecified: Secondary | ICD-10-CM | POA: Diagnosis not present

## 2014-02-02 DIAGNOSIS — N039 Chronic nephritic syndrome with unspecified morphologic changes: Secondary | ICD-10-CM | POA: Diagnosis not present

## 2014-02-02 DIAGNOSIS — N186 End stage renal disease: Secondary | ICD-10-CM | POA: Diagnosis not present

## 2014-02-02 DIAGNOSIS — N2581 Secondary hyperparathyroidism of renal origin: Secondary | ICD-10-CM | POA: Diagnosis not present

## 2014-02-04 ENCOUNTER — Encounter (HOSPITAL_COMMUNITY): Payer: Self-pay | Admitting: *Deleted

## 2014-02-04 ENCOUNTER — Encounter (HOSPITAL_COMMUNITY)
Admission: RE | Disposition: A | Discharge status: Home / Self Care | Payer: Self-pay | Source: Ambulatory Visit | Attending: Internal Medicine

## 2014-02-04 ENCOUNTER — Ambulatory Visit (HOSPITAL_COMMUNITY)
Admission: RE | Admit: 2014-02-04 | Discharge: 2014-02-04 | Disposition: A | Discharge status: Home / Self Care | Payer: Medicare Other | Source: Ambulatory Visit | Attending: Internal Medicine | Admitting: Internal Medicine

## 2014-02-04 DIAGNOSIS — Z7982 Long term (current) use of aspirin: Secondary | ICD-10-CM | POA: Insufficient documentation

## 2014-02-04 DIAGNOSIS — F17200 Nicotine dependence, unspecified, uncomplicated: Secondary | ICD-10-CM | POA: Diagnosis not present

## 2014-02-04 DIAGNOSIS — K648 Other hemorrhoids: Secondary | ICD-10-CM | POA: Insufficient documentation

## 2014-02-04 DIAGNOSIS — R195 Other fecal abnormalities: Secondary | ICD-10-CM | POA: Diagnosis not present

## 2014-02-04 DIAGNOSIS — Z79899 Other long term (current) drug therapy: Secondary | ICD-10-CM | POA: Insufficient documentation

## 2014-02-04 DIAGNOSIS — Z86010 Personal history of colonic polyps: Secondary | ICD-10-CM | POA: Diagnosis not present

## 2014-02-04 DIAGNOSIS — K921 Melena: Secondary | ICD-10-CM | POA: Insufficient documentation

## 2014-02-04 DIAGNOSIS — Z6837 Body mass index (BMI) 37.0-37.9, adult: Secondary | ICD-10-CM | POA: Diagnosis not present

## 2014-02-04 DIAGNOSIS — K5730 Diverticulosis of large intestine without perforation or abscess without bleeding: Secondary | ICD-10-CM | POA: Diagnosis not present

## 2014-02-04 DIAGNOSIS — N186 End stage renal disease: Secondary | ICD-10-CM | POA: Insufficient documentation

## 2014-02-04 DIAGNOSIS — Z992 Dependence on renal dialysis: Secondary | ICD-10-CM | POA: Insufficient documentation

## 2014-02-04 DIAGNOSIS — D128 Benign neoplasm of rectum: Secondary | ICD-10-CM | POA: Diagnosis not present

## 2014-02-04 DIAGNOSIS — E669 Obesity, unspecified: Secondary | ICD-10-CM | POA: Diagnosis not present

## 2014-02-04 DIAGNOSIS — J4489 Other specified chronic obstructive pulmonary disease: Secondary | ICD-10-CM | POA: Insufficient documentation

## 2014-02-04 DIAGNOSIS — E213 Hyperparathyroidism, unspecified: Secondary | ICD-10-CM | POA: Diagnosis not present

## 2014-02-04 DIAGNOSIS — D126 Benign neoplasm of colon, unspecified: Secondary | ICD-10-CM | POA: Diagnosis not present

## 2014-02-04 DIAGNOSIS — I120 Hypertensive chronic kidney disease with stage 5 chronic kidney disease or end stage renal disease: Secondary | ICD-10-CM | POA: Insufficient documentation

## 2014-02-04 DIAGNOSIS — D649 Anemia, unspecified: Secondary | ICD-10-CM | POA: Diagnosis not present

## 2014-02-04 DIAGNOSIS — D129 Benign neoplasm of anus and anal canal: Secondary | ICD-10-CM | POA: Diagnosis not present

## 2014-02-04 DIAGNOSIS — J449 Chronic obstructive pulmonary disease, unspecified: Secondary | ICD-10-CM | POA: Diagnosis not present

## 2014-02-04 HISTORY — PX: COLONOSCOPY: SHX5424

## 2014-02-04 SURGERY — COLONOSCOPY
Anesthesia: Moderate Sedation

## 2014-02-04 MED ORDER — MIDAZOLAM HCL 5 MG/5ML IJ SOLN
INTRAMUSCULAR | Status: AC
  Filled 2014-02-04: qty 10

## 2014-02-04 MED ORDER — ONDANSETRON HCL 4 MG/2ML IJ SOLN
INTRAMUSCULAR | Status: AC
  Filled 2014-02-04: qty 2

## 2014-02-04 MED ORDER — MEPERIDINE HCL 100 MG/ML IJ SOLN
INTRAMUSCULAR | Status: AC
  Filled 2014-02-04: qty 2

## 2014-02-04 MED ORDER — SIMETHICONE 40 MG/0.6ML PO SUSP
ORAL | Status: DC | PRN
  Administered 2014-02-04: 11:00:00

## 2014-02-04 MED ORDER — SODIUM CHLORIDE 0.9 % IV SOLN
INTRAVENOUS | Status: DC
  Administered 2014-02-04: 11:00:00 via INTRAVENOUS

## 2014-02-04 MED ORDER — MEPERIDINE HCL 100 MG/ML IJ SOLN
INTRAMUSCULAR | Status: DC | PRN
  Administered 2014-02-04: 50 mg via INTRAVENOUS
  Administered 2014-02-04: 25 mg via INTRAVENOUS

## 2014-02-04 MED ORDER — ONDANSETRON HCL 4 MG/2ML IJ SOLN
INTRAMUSCULAR | Status: DC | PRN
  Administered 2014-02-04: 4 mg via INTRAVENOUS

## 2014-02-04 MED ORDER — PROMETHAZINE HCL 25 MG/ML IJ SOLN
25.0000 mg | Freq: Once | INTRAMUSCULAR | Status: AC
  Administered 2014-02-04: 25 mg via INTRAVENOUS
  Filled 2014-02-04: qty 1

## 2014-02-04 MED ORDER — MIDAZOLAM HCL 5 MG/5ML IJ SOLN
INTRAMUSCULAR | Status: DC | PRN
  Administered 2014-02-04: 2 mg via INTRAVENOUS
  Administered 2014-02-04: 1 mg via INTRAVENOUS

## 2014-02-04 NOTE — H&P (View-Only) (Signed)
Primary Care Physician:  Vic Blackbird, MD Primary Gastroenterologist:  Dr. Gala Romney   Chief Complaint  Patient presents with  . Blood In Stools    HPI:   43 year old with heme positive stool presents today to discuss initial colonoscopy. No hematochezia or melena. No significant changes in bowel habits. Rare constipation if at all. No abdominal pain. No GERD, no dysphagia. No N/V. No lack of appetite or weight loss. No prior colonoscopy.   Past Medical History  Diagnosis Date  . Hypertension     h/o hypertensive crisis with encephalopathy  . Chronic obstructive pulmonary disease     with asthmatic component  . Anemia     Attributed to chronic disease/chronic kidney disease  . Chronic diastolic heart failure [id number]    EF 35% by echo in 2011; echo in 08/2011-normal EF, moderate to severe LVH; BNP level of [phone] in 2011-12  . ESRD (end stage renal disease) on dialysis     secondary hyperparathyroidism  . Obesity   . Tobacco abuse     Approximate consumption of 25 pack years; continuing at 0.5 pack per day    Past Surgical History  Procedure Laterality Date  . Arteriovenous graft placement  2011    Current Outpatient Prescriptions  Medication Sig Dispense Refill  . albuterol (PROAIR HFA) 108 (90 BASE) MCG/ACT inhaler Inhale 2 puffs into the lungs every 4 (four) hours as needed for wheezing.  3 Inhaler  3  . amLODipine (NORVASC) 10 MG tablet TAKE 1 TABLET EVERY DAY  30 tablet  0  . aspirin 325 MG tablet Take 325 mg by mouth daily.      [name] b complex-vitamin c-folic acid (NEPHRO-VITE) 0.8 MG TABS Take 0.8 mg by mouth at bedtime.      . budesonide-formoterol (SYMBICORT) 80-4.5 MCG/ACT inhaler Inhale 2 puffs into the lungs 2 (two) times daily.  1 Inhaler  12  . cloNIDine (CATAPRES) 0.3 MG tablet TAKE 1 TABLET THREE TIMES DAILY  90 tablet  0  . hydrALAZINE (APRESOLINE) 50 MG tablet TAKE 1 TABLET THREE TIMES DAILY  270 tablet  PRN  . HYDROcodone-acetaminophen (NORCO/VICODIN)  5-325 MG per tablet Take 1 tablet by mouth every 6 (six) hours as needed for pain.  90 tablet  0  . labetalol (NORMODYNE) 200 MG tablet TAKE 1 TABLET TWICE DAILY  60 tablet  0  . SENSIPAR 30 MG tablet Take 30 mg by mouth daily with breakfast.       . sevelamer (RENVELA) 800 MG tablet Take 800 mg by mouth 3 (three) times daily with meals.      . traZODone (DESYREL) 50 MG tablet Take 1-2 tablet at bedtime as needed  60 tablet  0  . albuterol (PROVENTIL,VENTOLIN) 90 MCG/ACT inhaler Inhale 2 puffs into the lungs every 4 (four) hours as needed for wheezing.  17 g  3   No current facility-administered medications for this visit.    Allergies as of 01/14/2014  . (No Known Allergies)    Family History  Problem Relation Age of Onset  . Heart disease Father   . Diabetes Sister   . Hypertension Sister   . Diabetes Sister   . Hypertension Sister   . Kidney disease Mother   . Hypertension Mother   . Hypertension Sister   . Hypertension Brother   . Hypertension Brother   . Hypertension Brother   . Colon cancer Neg Hx     History   Social History  .  Marital Status: Single    Spouse Name: N/A    Number of Children: 3  . Years of Education: N/A   Occupational History  . Disability    Social History Main Topics  . Smoking status: Current Every Day Smoker -- 0.50 packs/day for 25 years    Types: Cigarettes  . Smokeless tobacco: Not on file  . Alcohol Use: No     Comment: quit etoh about 2011. used to drink about 8-10 beers a week  . Drug Use: No  . Sexual Activity: Not on file   Other Topics Concern  . Not on file   Social History Narrative  . No narrative on file    Review of Systems: Gen: see HPI CV: Denies chest pain, heart palpitations, peripheral edema, syncope.  Resp: Denies shortness of breath at rest or with exertion. Denies wheezing or cough.  GI: see HPI GU : decreased urinary output, on dialysis MS: +joint pain Derm: Denies rash, itching, dry skin Psych:  Denies depression, anxiety, memory loss, and confusion Heme: Denies bruising, bleeding, and enlarged lymph nodes.  Physical Exam: BP 138/84  Pulse 75  Temp(Src) 98.1 F (36.7 C) (Oral)  Ht 6' (1.829 m)  Wt 275 lb 3.2 oz (124.83 kg)  BMI 37.32 kg/m2 General:   Alert and oriented. Pleasant and cooperative. Well-nourished and well-developed.  Head:  Normocephalic and atraumatic. Eyes:  Without icterus, sclera clear and conjunctiva pink.  Ears:  Normal auditory acuity. Nose:  No deformity, discharge,  or lesions. Mouth:  No deformity or lesions, oral mucosa pink.  Neck:  Supple, without mass or thyromegaly. Lungs:  Clear to auscultation bilaterally. No wheezes, rales, or rhonchi. No distress.  Heart:  S1, S2 present without murmurs appreciated.  Abdomen:  +BS, soft, non-tender and non-distended. No HSM noted. No guarding or rebound. Query small umbilical hernia. Rectal:  Deferred  Msk:  Symmetrical without gross deformities. Normal posture. Extremities:  Without clubbing or edema. Neurologic:  Alert and  oriented x4;  grossly normal neurologically. Skin:  Intact without significant lesions or rashes. Cervical Nodes:  No significant cervical adenopathy. Psych:  Alert and cooperative. Normal mood and affect.

## 2014-02-04 NOTE — Discharge Instructions (Addendum)
Colonoscopy Discharge Instructions  Read the instructions outlined below and refer to this sheet in the next few weeks. These discharge instructions provide you with general information on caring for yourself after you leave the hospital. Your doctor may also give you specific instructions. While your treatment has been planned according to the most current medical practices available, unavoidable complications occasionally occur. If you have any problems or questions after discharge, call Dr. Gala Barrera at [phone]. ACTIVITY  You may resume your regular activity, but move at a slower pace for the next 24 hours.   Take frequent rest periods for the next 24 hours.   Walking will help get rid of the air and reduce the bloated feeling in your belly (abdomen).   No driving for 24 hours (because of the medicine (anesthesia) used during the test).    Do not sign any important legal documents or operate any machinery for 24 hours (because of the anesthesia used during the test).  NUTRITION  Drink plenty of fluids.   You may resume your normal diet as instructed by your doctor.   Begin with a light meal and progress to your normal diet. Heavy or fried foods are harder to digest and may make you feel sick to your stomach (nauseated).   Avoid alcoholic beverages for 24 hours or as instructed.  MEDICATIONS  You may resume your normal medications unless your doctor tells you otherwise.  WHAT YOU CAN EXPECT TODAY  Some feelings of bloating in the abdomen.   Passage of more gas than usual.   Spotting of blood in your stool or on the toilet paper.  IF YOU HAD POLYPS REMOVED DURING THE COLONOSCOPY:  No aspirin products for 7 days or as instructed.   No alcohol for 7 days or as instructed.   Eat a soft diet for the next 24 hours.  FINDING OUT THE RESULTS OF YOUR TEST Not all test results are available during your visit. If your test results are not back during the visit, make an appointment  with your caregiver to find out the results. Do not assume everything is normal if you have not heard from your caregiver or the medical facility. It is important for you to follow up on all of your test results.  SEEK IMMEDIATE MEDICAL ATTENTION IF:  You have more than a spotting of blood in your stool.   Your belly is swollen (abdominal distention).   ou are nauseated or vomiting.   You have a temperature over 101.   You have abdominal pain or discomfort that is severe or gets worse throughout the day.     Diverticulosis and polyp information provided  Further recommendations to follow pending review of pathology  Colon Polyps Polyps are lumps of extra tissue growing inside the body. Polyps can grow in the large intestine (colon). Most colon polyps are noncancerous (benign). However, some colon polyps can become cancerous over time. Polyps that are larger than a pea may be harmful. To be safe, caregivers remove and test all polyps. CAUSES  Polyps form when mutations in the genes cause your cells to grow and divide even though no more tissue is needed. RISK FACTORS There are a number of risk factors that can increase your chances of getting colon polyps. They include: Being older than 50 years. Family history of colon polyps or colon cancer. Long-term colon diseases, such as colitis or Crohn disease. Being overweight. Smoking. Being inactive. Drinking too much alcohol. SYMPTOMS  Most small polyps do  not cause symptoms. If symptoms are present, they may include: Blood in the stool. The stool may look dark red or black. Constipation or diarrhea that lasts longer than 1 week. DIAGNOSIS People often do not know they have polyps until their caregiver finds them during a regular checkup. Your caregiver can use 4 tests to check for polyps: Digital rectal exam. The caregiver wears gloves and feels inside the rectum. This test would find polyps only in the rectum. Barium enema. The  caregiver puts a liquid called barium into your rectum before taking X-rays of your colon. Barium makes your colon look white. Polyps are dark, so they are easy to see in the X-ray pictures. Sigmoidoscopy. A thin, flexible tube (sigmoidoscope) is placed into your rectum. The sigmoidoscope has a light and tiny camera in it. The caregiver uses the sigmoidoscope to look at the last third of your colon. Colonoscopy. This test is like sigmoidoscopy, but the caregiver looks at the entire colon. This is the most common method for finding and removing polyps. TREATMENT  Any polyps will be removed during a sigmoidoscopy or colonoscopy. The polyps are then tested for cancer. PREVENTION  To help lower your risk of getting more colon polyps: Eat plenty of fruits and vegetables. Avoid eating fatty foods. Do not smoke. Avoid drinking alcohol. Exercise every day. Lose weight if recommended by your caregiver. Eat plenty of calcium and folate. Foods that are rich in calcium include milk, cheese, and broccoli. Foods that are rich in folate include chickpeas, kidney beans, and spinach. HOME CARE INSTRUCTIONS Keep all follow-up appointments as directed by your caregiver. You may need periodic exams to check for polyps. SEEK MEDICAL CARE IF: You notice bleeding during a bowel movement. Document Released: 02/04/2004 Document Revised: 08/02/2011 Document Reviewed: 07/20/2011 [facility] Patient Information 2015 [place], [place]. This information is not intended to replace advice given to you by your health care provider. Make sure you discuss any questions you have with your health care provider. Diverticulosis Diverticulosis is the condition that develops when small pouches (diverticula) form in the wall of your colon. Your colon, or large intestine, is where water is absorbed and stool is formed. The pouches form when the inside layer of your colon pushes through weak spots in the outer layers of your colon. CAUSES  No  one knows exactly what causes diverticulosis. RISK FACTORS Being older than 16. Your risk for this condition increases with age. Diverticulosis is rare in people younger than 40 years. By age 100, almost everyone has it. Eating a low-fiber diet. Being frequently constipated. Being overweight. Not getting enough exercise. Smoking. Taking over-the-counter pain medicines, like aspirin and ibuprofen. SYMPTOMS  Most people with diverticulosis do not have symptoms. DIAGNOSIS  Because diverticulosis often has no symptoms, health care providers often discover the condition during an exam for other colon problems. In many cases, a health care provider will diagnose diverticulosis while using a flexible scope to examine the colon (colonoscopy). TREATMENT  If you have never developed an infection related to diverticulosis, you may not need treatment. If you have had an infection before, treatment may include: Eating more fruits, vegetables, and grains. Taking a fiber supplement. Taking a live bacteria supplement (probiotic). Taking medicine to relax your colon. HOME CARE INSTRUCTIONS  Drink at least 6-8 glasses of water each day to prevent constipation. Try not to strain when you have a bowel movement. Keep all follow-up appointments. If you have had an infection before: Increase the fiber in your diet as  directed by your health care provider or dietitian. Take a dietary fiber supplement if your health care provider approves. Only take medicines as directed by your health care provider. SEEK MEDICAL CARE IF:  You have abdominal pain. You have bloating. You have cramps. You have not gone to the bathroom in 3 days. SEEK IMMEDIATE MEDICAL CARE IF:  Your pain gets worse. Yourbloating becomes very bad. You have a fever or chills, and your symptoms suddenly get worse. You begin vomiting. You have bowel movements that are bloody or black. MAKE SURE YOU: Understand these instructions. Will  watch your condition. Will get help right away if you are not doing well or get worse. Document Released: 02/05/2004 Document Revised: 05/15/2013 Document Reviewed: 04/04/2013 [facility] Patient Information 2015 [place], [place]. This information is not intended to replace advice given to you by your health care provider. Make sure you discuss any questions you have with your health care provider.

## 2014-02-04 NOTE — Interval H&P Note (Signed)
History and Physical Interval Note:  02/04/2014 11:03 AM  Brian Barrera  has presented today for surgery, with the diagnosis of HEME POSITIVE STOOL  The various methods of treatment have been discussed with the patient and family. After consideration of risks, benefits and other options for treatment, the patient has consented to  Procedure(s) with comments: COLONOSCOPY (N/A) - 12:00 as a surgical intervention .  The patient's history has been reviewed, patient examined, no change in status, stable for surgery.  I have reviewed the patient's chart and labs.  Questions were answered to the patient's satisfaction.     Brian Barrera  No change. Colonoscopy per plan.The risks, benefits, limitations, alternatives and imponderables have been reviewed with the patient. Questions have been answered. All parties are agreeable.

## 2014-02-04 NOTE — Op Note (Signed)
[facility] [address] [place], [address]   COLONOSCOPY PROCEDURE REPORT  PATIENT: Brian Barrera, Brian Barrera  MR#:         [id number] BIRTHDATE: [birth year] , 43  yrs. old GENDER: Male ENDOSCOPIST: R.  Garfield Cornea, MD FACP FACG REFERRED BY:  Vic Blackbird, M.D. PROCEDURE DATE:  02/04/2014 PROCEDURE:     Colonoscopy with biopsy and snare polypectomy  INDICATIONS: Hemoccult-positive stool  INFORMED CONSENT:  The risks, benefits, alternatives and imponderables including but not limited to bleeding, perforation as well as the possibility of a missed lesion have been reviewed.  The potential for biopsy, lesion removal, etc. have also been discussed.  Questions have been answered.  All parties agreeable. Please see the history and physical in the medical record for more information.  MEDICATIONS: Versed 3 mg IV and Demerol 75 mg IV in divided doses. Phenergan 25 mg IV and Zofran 4 mg IV.  DESCRIPTION OF PROCEDURE:  After a digital rectal exam was performed, the [id number]  colonoscope was advanced from the anus through the rectum and colon to the area of the cecum, ileocecal valve and appendiceal orifice.  The cecum was deeply intubated.  These structures were well-seen and photographed for the record.  From the level of the cecum and ileocecal valve, the scope was slowly and cautiously withdrawn.  The mucosal surfaces were carefully surveyed utilizing scope tip deflection to facilitate fold flattening as needed.  The scope was pulled down into the rectum where a thorough examination including retroflexion was performed.    FINDINGS:  Adequate preparation. Anal papilla and internal hemorrhoids; otherwise, normal rectum. Redundant colon with (2) 3 mm polyps in the mid descending segment; (1) diminutive polyp in the base of the cecum; the patient also had (1) diverticulum in the ascending segment; the colon was also redundant but otherwise normal  appearing aside from the abnormalities noted.  THERAPEUTIC / DIAGNOSTIC MANEUVERS PERFORMED:  The cecal polyp was cold biopsied/removed. The 2 descending polyps were cold snare removed.  COMPLICATIONS: none  CECAL WITHDRAWAL TIME:  17 minutes  IMPRESSION:  Single colonic diverticula. Colonic polyps-removed as described above. Internal hemorrhoids.  RECOMMENDATIONS: Followup on pathology. Further recommendations to follow.   _______________________________ eSigned:  R. Garfield Cornea, MD FACP [facility] 02/04/2014 11:49 AM   CC:

## 2014-02-05 DIAGNOSIS — D509 Iron deficiency anemia, unspecified: Secondary | ICD-10-CM | POA: Diagnosis not present

## 2014-02-05 DIAGNOSIS — N186 End stage renal disease: Secondary | ICD-10-CM | POA: Diagnosis not present

## 2014-02-05 DIAGNOSIS — N2581 Secondary hyperparathyroidism of renal origin: Secondary | ICD-10-CM | POA: Diagnosis not present

## 2014-02-05 DIAGNOSIS — D631 Anemia in chronic kidney disease: Secondary | ICD-10-CM | POA: Diagnosis not present

## 2014-02-06 ENCOUNTER — Encounter (HOSPITAL_COMMUNITY): Payer: Self-pay | Admitting: Internal Medicine

## 2014-02-06 ENCOUNTER — Encounter: Payer: Self-pay | Admitting: Internal Medicine

## 2014-02-07 DIAGNOSIS — N186 End stage renal disease: Secondary | ICD-10-CM | POA: Diagnosis not present

## 2014-02-07 DIAGNOSIS — D509 Iron deficiency anemia, unspecified: Secondary | ICD-10-CM | POA: Diagnosis not present

## 2014-02-07 DIAGNOSIS — N2581 Secondary hyperparathyroidism of renal origin: Secondary | ICD-10-CM | POA: Diagnosis not present

## 2014-02-07 DIAGNOSIS — D631 Anemia in chronic kidney disease: Secondary | ICD-10-CM | POA: Diagnosis not present

## 2014-02-09 DIAGNOSIS — D509 Iron deficiency anemia, unspecified: Secondary | ICD-10-CM | POA: Diagnosis not present

## 2014-02-09 DIAGNOSIS — N039 Chronic nephritic syndrome with unspecified morphologic changes: Secondary | ICD-10-CM | POA: Diagnosis not present

## 2014-02-09 DIAGNOSIS — D631 Anemia in chronic kidney disease: Secondary | ICD-10-CM | POA: Diagnosis not present

## 2014-02-09 DIAGNOSIS — N186 End stage renal disease: Secondary | ICD-10-CM | POA: Diagnosis not present

## 2014-02-09 DIAGNOSIS — N2581 Secondary hyperparathyroidism of renal origin: Secondary | ICD-10-CM | POA: Diagnosis not present

## 2014-02-11 ENCOUNTER — Other Ambulatory Visit: Payer: Self-pay | Admitting: *Deleted

## 2014-02-11 MED ORDER — ALBUTEROL SULFATE HFA 108 (90 BASE) MCG/ACT IN AERS: 2.0000 | INHALATION_SPRAY | RESPIRATORY_TRACT | Status: DC | PRN

## 2014-02-11 NOTE — Telephone Encounter (Signed)
Received fax requesting refill on Albuterol Inhaler.   Medication filled x1 with no refills.   Requires office visit before any further refills can be given.   Letter sent.

## 2014-02-12 DIAGNOSIS — D509 Iron deficiency anemia, unspecified: Secondary | ICD-10-CM | POA: Diagnosis not present

## 2014-02-12 DIAGNOSIS — N186 End stage renal disease: Secondary | ICD-10-CM | POA: Diagnosis not present

## 2014-02-12 DIAGNOSIS — D631 Anemia in chronic kidney disease: Secondary | ICD-10-CM | POA: Diagnosis not present

## 2014-02-12 DIAGNOSIS — N2581 Secondary hyperparathyroidism of renal origin: Secondary | ICD-10-CM | POA: Diagnosis not present

## 2014-02-14 DIAGNOSIS — N2581 Secondary hyperparathyroidism of renal origin: Secondary | ICD-10-CM | POA: Diagnosis not present

## 2014-02-14 DIAGNOSIS — D509 Iron deficiency anemia, unspecified: Secondary | ICD-10-CM | POA: Diagnosis not present

## 2014-02-14 DIAGNOSIS — N186 End stage renal disease: Secondary | ICD-10-CM | POA: Diagnosis not present

## 2014-02-14 DIAGNOSIS — D631 Anemia in chronic kidney disease: Secondary | ICD-10-CM | POA: Diagnosis not present

## 2014-02-16 DIAGNOSIS — N2581 Secondary hyperparathyroidism of renal origin: Secondary | ICD-10-CM | POA: Diagnosis not present

## 2014-02-16 DIAGNOSIS — D509 Iron deficiency anemia, unspecified: Secondary | ICD-10-CM | POA: Diagnosis not present

## 2014-02-16 DIAGNOSIS — N186 End stage renal disease: Secondary | ICD-10-CM | POA: Diagnosis not present

## 2014-02-16 DIAGNOSIS — N039 Chronic nephritic syndrome with unspecified morphologic changes: Secondary | ICD-10-CM | POA: Diagnosis not present

## 2014-02-16 DIAGNOSIS — D631 Anemia in chronic kidney disease: Secondary | ICD-10-CM | POA: Diagnosis not present

## 2014-02-19 DIAGNOSIS — N186 End stage renal disease: Secondary | ICD-10-CM | POA: Diagnosis not present

## 2014-02-19 DIAGNOSIS — D631 Anemia in chronic kidney disease: Secondary | ICD-10-CM | POA: Diagnosis not present

## 2014-02-19 DIAGNOSIS — D509 Iron deficiency anemia, unspecified: Secondary | ICD-10-CM | POA: Diagnosis not present

## 2014-02-19 DIAGNOSIS — N2581 Secondary hyperparathyroidism of renal origin: Secondary | ICD-10-CM | POA: Diagnosis not present

## 2014-02-20 DIAGNOSIS — N186 End stage renal disease: Secondary | ICD-10-CM | POA: Diagnosis not present

## 2014-02-21 DIAGNOSIS — Z23 Encounter for immunization: Secondary | ICD-10-CM | POA: Diagnosis not present

## 2014-02-21 DIAGNOSIS — Z992 Dependence on renal dialysis: Secondary | ICD-10-CM | POA: Diagnosis not present

## 2014-02-21 DIAGNOSIS — E611 Iron deficiency: Secondary | ICD-10-CM | POA: Diagnosis not present

## 2014-02-21 DIAGNOSIS — N186 End stage renal disease: Secondary | ICD-10-CM | POA: Diagnosis not present

## 2014-02-21 DIAGNOSIS — D509 Iron deficiency anemia, unspecified: Secondary | ICD-10-CM | POA: Diagnosis not present

## 2014-02-21 DIAGNOSIS — D631 Anemia in chronic kidney disease: Secondary | ICD-10-CM | POA: Diagnosis not present

## 2014-02-21 DIAGNOSIS — N2581 Secondary hyperparathyroidism of renal origin: Secondary | ICD-10-CM | POA: Diagnosis not present

## 2014-03-12 DIAGNOSIS — Z992 Dependence on renal dialysis: Secondary | ICD-10-CM | POA: Diagnosis not present

## 2014-03-12 DIAGNOSIS — N186 End stage renal disease: Secondary | ICD-10-CM | POA: Diagnosis not present

## 2014-03-20 DIAGNOSIS — I10 Essential (primary) hypertension: Secondary | ICD-10-CM | POA: Diagnosis not present

## 2014-03-20 DIAGNOSIS — J441 Chronic obstructive pulmonary disease with (acute) exacerbation: Secondary | ICD-10-CM | POA: Diagnosis not present

## 2014-03-20 DIAGNOSIS — J449 Chronic obstructive pulmonary disease, unspecified: Secondary | ICD-10-CM | POA: Diagnosis not present

## 2014-03-20 DIAGNOSIS — N186 End stage renal disease: Secondary | ICD-10-CM | POA: Diagnosis not present

## 2014-03-23 DIAGNOSIS — Z992 Dependence on renal dialysis: Secondary | ICD-10-CM | POA: Diagnosis not present

## 2014-03-23 DIAGNOSIS — N186 End stage renal disease: Secondary | ICD-10-CM | POA: Diagnosis not present

## 2014-03-26 DIAGNOSIS — N2581 Secondary hyperparathyroidism of renal origin: Secondary | ICD-10-CM | POA: Diagnosis not present

## 2014-03-26 DIAGNOSIS — Z992 Dependence on renal dialysis: Secondary | ICD-10-CM | POA: Diagnosis not present

## 2014-03-26 DIAGNOSIS — N186 End stage renal disease: Secondary | ICD-10-CM | POA: Diagnosis not present

## 2014-03-26 DIAGNOSIS — D509 Iron deficiency anemia, unspecified: Secondary | ICD-10-CM | POA: Diagnosis not present

## 2014-03-26 DIAGNOSIS — D631 Anemia in chronic kidney disease: Secondary | ICD-10-CM | POA: Diagnosis not present

## 2014-03-28 DIAGNOSIS — N186 End stage renal disease: Secondary | ICD-10-CM | POA: Diagnosis not present

## 2014-03-28 DIAGNOSIS — D631 Anemia in chronic kidney disease: Secondary | ICD-10-CM | POA: Diagnosis not present

## 2014-03-28 DIAGNOSIS — D509 Iron deficiency anemia, unspecified: Secondary | ICD-10-CM | POA: Diagnosis not present

## 2014-03-28 DIAGNOSIS — N2581 Secondary hyperparathyroidism of renal origin: Secondary | ICD-10-CM | POA: Diagnosis not present

## 2014-03-28 DIAGNOSIS — Z992 Dependence on renal dialysis: Secondary | ICD-10-CM | POA: Diagnosis not present

## 2014-03-30 DIAGNOSIS — Z992 Dependence on renal dialysis: Secondary | ICD-10-CM | POA: Diagnosis not present

## 2014-03-30 DIAGNOSIS — D631 Anemia in chronic kidney disease: Secondary | ICD-10-CM | POA: Diagnosis not present

## 2014-03-30 DIAGNOSIS — N186 End stage renal disease: Secondary | ICD-10-CM | POA: Diagnosis not present

## 2014-03-30 DIAGNOSIS — D509 Iron deficiency anemia, unspecified: Secondary | ICD-10-CM | POA: Diagnosis not present

## 2014-03-30 DIAGNOSIS — N2581 Secondary hyperparathyroidism of renal origin: Secondary | ICD-10-CM | POA: Diagnosis not present

## 2014-04-02 DIAGNOSIS — D509 Iron deficiency anemia, unspecified: Secondary | ICD-10-CM | POA: Diagnosis not present

## 2014-04-02 DIAGNOSIS — Z992 Dependence on renal dialysis: Secondary | ICD-10-CM | POA: Diagnosis not present

## 2014-04-02 DIAGNOSIS — N186 End stage renal disease: Secondary | ICD-10-CM | POA: Diagnosis not present

## 2014-04-02 DIAGNOSIS — N2581 Secondary hyperparathyroidism of renal origin: Secondary | ICD-10-CM | POA: Diagnosis not present

## 2014-04-02 DIAGNOSIS — D631 Anemia in chronic kidney disease: Secondary | ICD-10-CM | POA: Diagnosis not present

## 2014-04-04 DIAGNOSIS — Z992 Dependence on renal dialysis: Secondary | ICD-10-CM | POA: Diagnosis not present

## 2014-04-04 DIAGNOSIS — D509 Iron deficiency anemia, unspecified: Secondary | ICD-10-CM | POA: Diagnosis not present

## 2014-04-04 DIAGNOSIS — N2581 Secondary hyperparathyroidism of renal origin: Secondary | ICD-10-CM | POA: Diagnosis not present

## 2014-04-04 DIAGNOSIS — D631 Anemia in chronic kidney disease: Secondary | ICD-10-CM | POA: Diagnosis not present

## 2014-04-04 DIAGNOSIS — N186 End stage renal disease: Secondary | ICD-10-CM | POA: Diagnosis not present

## 2014-04-06 DIAGNOSIS — N186 End stage renal disease: Secondary | ICD-10-CM | POA: Diagnosis not present

## 2014-04-06 DIAGNOSIS — D631 Anemia in chronic kidney disease: Secondary | ICD-10-CM | POA: Diagnosis not present

## 2014-04-06 DIAGNOSIS — D509 Iron deficiency anemia, unspecified: Secondary | ICD-10-CM | POA: Diagnosis not present

## 2014-04-06 DIAGNOSIS — Z992 Dependence on renal dialysis: Secondary | ICD-10-CM | POA: Diagnosis not present

## 2014-04-06 DIAGNOSIS — N2581 Secondary hyperparathyroidism of renal origin: Secondary | ICD-10-CM | POA: Diagnosis not present

## 2014-04-09 DIAGNOSIS — D631 Anemia in chronic kidney disease: Secondary | ICD-10-CM | POA: Diagnosis not present

## 2014-04-09 DIAGNOSIS — Z992 Dependence on renal dialysis: Secondary | ICD-10-CM | POA: Diagnosis not present

## 2014-04-09 DIAGNOSIS — N2581 Secondary hyperparathyroidism of renal origin: Secondary | ICD-10-CM | POA: Diagnosis not present

## 2014-04-09 DIAGNOSIS — N186 End stage renal disease: Secondary | ICD-10-CM | POA: Diagnosis not present

## 2014-04-09 DIAGNOSIS — D509 Iron deficiency anemia, unspecified: Secondary | ICD-10-CM | POA: Diagnosis not present

## 2014-04-11 DIAGNOSIS — D631 Anemia in chronic kidney disease: Secondary | ICD-10-CM | POA: Diagnosis not present

## 2014-04-11 DIAGNOSIS — Z992 Dependence on renal dialysis: Secondary | ICD-10-CM | POA: Diagnosis not present

## 2014-04-11 DIAGNOSIS — D509 Iron deficiency anemia, unspecified: Secondary | ICD-10-CM | POA: Diagnosis not present

## 2014-04-11 DIAGNOSIS — N2581 Secondary hyperparathyroidism of renal origin: Secondary | ICD-10-CM | POA: Diagnosis not present

## 2014-04-11 DIAGNOSIS — N186 End stage renal disease: Secondary | ICD-10-CM | POA: Diagnosis not present

## 2014-04-13 DIAGNOSIS — D509 Iron deficiency anemia, unspecified: Secondary | ICD-10-CM | POA: Diagnosis not present

## 2014-04-13 DIAGNOSIS — D631 Anemia in chronic kidney disease: Secondary | ICD-10-CM | POA: Diagnosis not present

## 2014-04-13 DIAGNOSIS — Z992 Dependence on renal dialysis: Secondary | ICD-10-CM | POA: Diagnosis not present

## 2014-04-13 DIAGNOSIS — N186 End stage renal disease: Secondary | ICD-10-CM | POA: Diagnosis not present

## 2014-04-13 DIAGNOSIS — N2581 Secondary hyperparathyroidism of renal origin: Secondary | ICD-10-CM | POA: Diagnosis not present

## 2014-04-16 DIAGNOSIS — N186 End stage renal disease: Secondary | ICD-10-CM | POA: Diagnosis not present

## 2014-04-16 DIAGNOSIS — Z992 Dependence on renal dialysis: Secondary | ICD-10-CM | POA: Diagnosis not present

## 2014-04-16 DIAGNOSIS — N2581 Secondary hyperparathyroidism of renal origin: Secondary | ICD-10-CM | POA: Diagnosis not present

## 2014-04-16 DIAGNOSIS — D509 Iron deficiency anemia, unspecified: Secondary | ICD-10-CM | POA: Diagnosis not present

## 2014-04-16 DIAGNOSIS — D631 Anemia in chronic kidney disease: Secondary | ICD-10-CM | POA: Diagnosis not present

## 2014-04-18 DIAGNOSIS — D509 Iron deficiency anemia, unspecified: Secondary | ICD-10-CM | POA: Diagnosis not present

## 2014-04-18 DIAGNOSIS — N186 End stage renal disease: Secondary | ICD-10-CM | POA: Diagnosis not present

## 2014-04-18 DIAGNOSIS — Z992 Dependence on renal dialysis: Secondary | ICD-10-CM | POA: Diagnosis not present

## 2014-04-18 DIAGNOSIS — D631 Anemia in chronic kidney disease: Secondary | ICD-10-CM | POA: Diagnosis not present

## 2014-04-18 DIAGNOSIS — N2581 Secondary hyperparathyroidism of renal origin: Secondary | ICD-10-CM | POA: Diagnosis not present

## 2014-04-20 DIAGNOSIS — D631 Anemia in chronic kidney disease: Secondary | ICD-10-CM | POA: Diagnosis not present

## 2014-04-20 DIAGNOSIS — D509 Iron deficiency anemia, unspecified: Secondary | ICD-10-CM | POA: Diagnosis not present

## 2014-04-20 DIAGNOSIS — Z992 Dependence on renal dialysis: Secondary | ICD-10-CM | POA: Diagnosis not present

## 2014-04-20 DIAGNOSIS — N2581 Secondary hyperparathyroidism of renal origin: Secondary | ICD-10-CM | POA: Diagnosis not present

## 2014-04-20 DIAGNOSIS — N186 End stage renal disease: Secondary | ICD-10-CM | POA: Diagnosis not present

## 2014-04-22 DIAGNOSIS — Z992 Dependence on renal dialysis: Secondary | ICD-10-CM | POA: Diagnosis not present

## 2014-04-22 DIAGNOSIS — N186 End stage renal disease: Secondary | ICD-10-CM | POA: Diagnosis not present

## 2014-04-23 DIAGNOSIS — Z992 Dependence on renal dialysis: Secondary | ICD-10-CM | POA: Diagnosis not present

## 2014-04-23 DIAGNOSIS — N186 End stage renal disease: Secondary | ICD-10-CM | POA: Diagnosis not present

## 2014-04-23 DIAGNOSIS — N2581 Secondary hyperparathyroidism of renal origin: Secondary | ICD-10-CM | POA: Diagnosis not present

## 2014-04-23 DIAGNOSIS — D509 Iron deficiency anemia, unspecified: Secondary | ICD-10-CM | POA: Diagnosis not present

## 2014-04-23 DIAGNOSIS — D631 Anemia in chronic kidney disease: Secondary | ICD-10-CM | POA: Diagnosis not present

## 2014-05-23 DIAGNOSIS — N186 End stage renal disease: Secondary | ICD-10-CM | POA: Diagnosis not present

## 2014-05-23 DIAGNOSIS — Z992 Dependence on renal dialysis: Secondary | ICD-10-CM | POA: Diagnosis not present

## 2014-05-25 DIAGNOSIS — D631 Anemia in chronic kidney disease: Secondary | ICD-10-CM | POA: Diagnosis not present

## 2014-05-25 DIAGNOSIS — N186 End stage renal disease: Secondary | ICD-10-CM | POA: Diagnosis not present

## 2014-05-25 DIAGNOSIS — D509 Iron deficiency anemia, unspecified: Secondary | ICD-10-CM | POA: Diagnosis not present

## 2014-05-25 DIAGNOSIS — N2581 Secondary hyperparathyroidism of renal origin: Secondary | ICD-10-CM | POA: Diagnosis not present

## 2014-05-25 DIAGNOSIS — Z992 Dependence on renal dialysis: Secondary | ICD-10-CM | POA: Diagnosis not present

## 2014-06-23 DIAGNOSIS — N186 End stage renal disease: Secondary | ICD-10-CM | POA: Diagnosis not present

## 2014-06-23 DIAGNOSIS — Z992 Dependence on renal dialysis: Secondary | ICD-10-CM | POA: Diagnosis not present

## 2014-06-25 DIAGNOSIS — N186 End stage renal disease: Secondary | ICD-10-CM | POA: Diagnosis not present

## 2014-06-25 DIAGNOSIS — D631 Anemia in chronic kidney disease: Secondary | ICD-10-CM | POA: Diagnosis not present

## 2014-06-25 DIAGNOSIS — N2581 Secondary hyperparathyroidism of renal origin: Secondary | ICD-10-CM | POA: Diagnosis not present

## 2014-06-25 DIAGNOSIS — Z992 Dependence on renal dialysis: Secondary | ICD-10-CM | POA: Diagnosis not present

## 2014-06-25 DIAGNOSIS — D509 Iron deficiency anemia, unspecified: Secondary | ICD-10-CM | POA: Diagnosis not present

## 2014-07-22 DIAGNOSIS — Z992 Dependence on renal dialysis: Secondary | ICD-10-CM | POA: Diagnosis not present

## 2014-07-22 DIAGNOSIS — N186 End stage renal disease: Secondary | ICD-10-CM | POA: Diagnosis not present

## 2014-07-23 DIAGNOSIS — Z992 Dependence on renal dialysis: Secondary | ICD-10-CM | POA: Diagnosis not present

## 2014-07-23 DIAGNOSIS — D509 Iron deficiency anemia, unspecified: Secondary | ICD-10-CM | POA: Diagnosis not present

## 2014-07-23 DIAGNOSIS — D631 Anemia in chronic kidney disease: Secondary | ICD-10-CM | POA: Diagnosis not present

## 2014-07-23 DIAGNOSIS — N186 End stage renal disease: Secondary | ICD-10-CM | POA: Diagnosis not present

## 2014-07-23 DIAGNOSIS — N2581 Secondary hyperparathyroidism of renal origin: Secondary | ICD-10-CM | POA: Diagnosis not present

## 2014-07-25 DIAGNOSIS — D509 Iron deficiency anemia, unspecified: Secondary | ICD-10-CM | POA: Diagnosis not present

## 2014-07-25 DIAGNOSIS — N2581 Secondary hyperparathyroidism of renal origin: Secondary | ICD-10-CM | POA: Diagnosis not present

## 2014-07-25 DIAGNOSIS — N186 End stage renal disease: Secondary | ICD-10-CM | POA: Diagnosis not present

## 2014-07-25 DIAGNOSIS — Z992 Dependence on renal dialysis: Secondary | ICD-10-CM | POA: Diagnosis not present

## 2014-07-25 DIAGNOSIS — D631 Anemia in chronic kidney disease: Secondary | ICD-10-CM | POA: Diagnosis not present

## 2014-07-27 DIAGNOSIS — N2581 Secondary hyperparathyroidism of renal origin: Secondary | ICD-10-CM | POA: Diagnosis not present

## 2014-07-27 DIAGNOSIS — D631 Anemia in chronic kidney disease: Secondary | ICD-10-CM | POA: Diagnosis not present

## 2014-07-27 DIAGNOSIS — Z992 Dependence on renal dialysis: Secondary | ICD-10-CM | POA: Diagnosis not present

## 2014-07-27 DIAGNOSIS — D509 Iron deficiency anemia, unspecified: Secondary | ICD-10-CM | POA: Diagnosis not present

## 2014-07-27 DIAGNOSIS — N186 End stage renal disease: Secondary | ICD-10-CM | POA: Diagnosis not present

## 2014-07-30 DIAGNOSIS — N2581 Secondary hyperparathyroidism of renal origin: Secondary | ICD-10-CM | POA: Diagnosis not present

## 2014-07-30 DIAGNOSIS — D631 Anemia in chronic kidney disease: Secondary | ICD-10-CM | POA: Diagnosis not present

## 2014-07-30 DIAGNOSIS — N186 End stage renal disease: Secondary | ICD-10-CM | POA: Diagnosis not present

## 2014-07-30 DIAGNOSIS — D509 Iron deficiency anemia, unspecified: Secondary | ICD-10-CM | POA: Diagnosis not present

## 2014-07-30 DIAGNOSIS — Z992 Dependence on renal dialysis: Secondary | ICD-10-CM | POA: Diagnosis not present

## 2014-08-01 DIAGNOSIS — D509 Iron deficiency anemia, unspecified: Secondary | ICD-10-CM | POA: Diagnosis not present

## 2014-08-01 DIAGNOSIS — Z992 Dependence on renal dialysis: Secondary | ICD-10-CM | POA: Diagnosis not present

## 2014-08-01 DIAGNOSIS — N2581 Secondary hyperparathyroidism of renal origin: Secondary | ICD-10-CM | POA: Diagnosis not present

## 2014-08-01 DIAGNOSIS — D631 Anemia in chronic kidney disease: Secondary | ICD-10-CM | POA: Diagnosis not present

## 2014-08-01 DIAGNOSIS — N186 End stage renal disease: Secondary | ICD-10-CM | POA: Diagnosis not present

## 2014-08-03 DIAGNOSIS — Z992 Dependence on renal dialysis: Secondary | ICD-10-CM | POA: Diagnosis not present

## 2014-08-03 DIAGNOSIS — D509 Iron deficiency anemia, unspecified: Secondary | ICD-10-CM | POA: Diagnosis not present

## 2014-08-03 DIAGNOSIS — N2581 Secondary hyperparathyroidism of renal origin: Secondary | ICD-10-CM | POA: Diagnosis not present

## 2014-08-03 DIAGNOSIS — D631 Anemia in chronic kidney disease: Secondary | ICD-10-CM | POA: Diagnosis not present

## 2014-08-03 DIAGNOSIS — N186 End stage renal disease: Secondary | ICD-10-CM | POA: Diagnosis not present

## 2014-08-06 DIAGNOSIS — N2581 Secondary hyperparathyroidism of renal origin: Secondary | ICD-10-CM | POA: Diagnosis not present

## 2014-08-06 DIAGNOSIS — N186 End stage renal disease: Secondary | ICD-10-CM | POA: Diagnosis not present

## 2014-08-06 DIAGNOSIS — D509 Iron deficiency anemia, unspecified: Secondary | ICD-10-CM | POA: Diagnosis not present

## 2014-08-06 DIAGNOSIS — Z992 Dependence on renal dialysis: Secondary | ICD-10-CM | POA: Diagnosis not present

## 2014-08-06 DIAGNOSIS — D631 Anemia in chronic kidney disease: Secondary | ICD-10-CM | POA: Diagnosis not present

## 2014-08-08 DIAGNOSIS — Z992 Dependence on renal dialysis: Secondary | ICD-10-CM | POA: Diagnosis not present

## 2014-08-08 DIAGNOSIS — D509 Iron deficiency anemia, unspecified: Secondary | ICD-10-CM | POA: Diagnosis not present

## 2014-08-08 DIAGNOSIS — D631 Anemia in chronic kidney disease: Secondary | ICD-10-CM | POA: Diagnosis not present

## 2014-08-08 DIAGNOSIS — N186 End stage renal disease: Secondary | ICD-10-CM | POA: Diagnosis not present

## 2014-08-08 DIAGNOSIS — N2581 Secondary hyperparathyroidism of renal origin: Secondary | ICD-10-CM | POA: Diagnosis not present

## 2014-08-10 DIAGNOSIS — N186 End stage renal disease: Secondary | ICD-10-CM | POA: Diagnosis not present

## 2014-08-10 DIAGNOSIS — N2581 Secondary hyperparathyroidism of renal origin: Secondary | ICD-10-CM | POA: Diagnosis not present

## 2014-08-10 DIAGNOSIS — D631 Anemia in chronic kidney disease: Secondary | ICD-10-CM | POA: Diagnosis not present

## 2014-08-10 DIAGNOSIS — Z992 Dependence on renal dialysis: Secondary | ICD-10-CM | POA: Diagnosis not present

## 2014-08-10 DIAGNOSIS — D509 Iron deficiency anemia, unspecified: Secondary | ICD-10-CM | POA: Diagnosis not present

## 2014-08-13 DIAGNOSIS — N2581 Secondary hyperparathyroidism of renal origin: Secondary | ICD-10-CM | POA: Diagnosis not present

## 2014-08-13 DIAGNOSIS — N186 End stage renal disease: Secondary | ICD-10-CM | POA: Diagnosis not present

## 2014-08-13 DIAGNOSIS — D509 Iron deficiency anemia, unspecified: Secondary | ICD-10-CM | POA: Diagnosis not present

## 2014-08-13 DIAGNOSIS — Z992 Dependence on renal dialysis: Secondary | ICD-10-CM | POA: Diagnosis not present

## 2014-08-13 DIAGNOSIS — D631 Anemia in chronic kidney disease: Secondary | ICD-10-CM | POA: Diagnosis not present

## 2014-08-15 DIAGNOSIS — N186 End stage renal disease: Secondary | ICD-10-CM | POA: Diagnosis not present

## 2014-08-15 DIAGNOSIS — D631 Anemia in chronic kidney disease: Secondary | ICD-10-CM | POA: Diagnosis not present

## 2014-08-15 DIAGNOSIS — Z992 Dependence on renal dialysis: Secondary | ICD-10-CM | POA: Diagnosis not present

## 2014-08-15 DIAGNOSIS — N2581 Secondary hyperparathyroidism of renal origin: Secondary | ICD-10-CM | POA: Diagnosis not present

## 2014-08-15 DIAGNOSIS — D509 Iron deficiency anemia, unspecified: Secondary | ICD-10-CM | POA: Diagnosis not present

## 2014-08-17 DIAGNOSIS — Z992 Dependence on renal dialysis: Secondary | ICD-10-CM | POA: Diagnosis not present

## 2014-08-17 DIAGNOSIS — N2581 Secondary hyperparathyroidism of renal origin: Secondary | ICD-10-CM | POA: Diagnosis not present

## 2014-08-17 DIAGNOSIS — D631 Anemia in chronic kidney disease: Secondary | ICD-10-CM | POA: Diagnosis not present

## 2014-08-17 DIAGNOSIS — N186 End stage renal disease: Secondary | ICD-10-CM | POA: Diagnosis not present

## 2014-08-17 DIAGNOSIS — D509 Iron deficiency anemia, unspecified: Secondary | ICD-10-CM | POA: Diagnosis not present

## 2014-08-20 DIAGNOSIS — N2581 Secondary hyperparathyroidism of renal origin: Secondary | ICD-10-CM | POA: Diagnosis not present

## 2014-08-20 DIAGNOSIS — D509 Iron deficiency anemia, unspecified: Secondary | ICD-10-CM | POA: Diagnosis not present

## 2014-08-20 DIAGNOSIS — D631 Anemia in chronic kidney disease: Secondary | ICD-10-CM | POA: Diagnosis not present

## 2014-08-20 DIAGNOSIS — N186 End stage renal disease: Secondary | ICD-10-CM | POA: Diagnosis not present

## 2014-08-20 DIAGNOSIS — Z992 Dependence on renal dialysis: Secondary | ICD-10-CM | POA: Diagnosis not present

## 2014-08-22 DIAGNOSIS — N2581 Secondary hyperparathyroidism of renal origin: Secondary | ICD-10-CM | POA: Diagnosis not present

## 2014-08-22 DIAGNOSIS — D631 Anemia in chronic kidney disease: Secondary | ICD-10-CM | POA: Diagnosis not present

## 2014-08-22 DIAGNOSIS — D509 Iron deficiency anemia, unspecified: Secondary | ICD-10-CM | POA: Diagnosis not present

## 2014-08-22 DIAGNOSIS — N186 End stage renal disease: Secondary | ICD-10-CM | POA: Diagnosis not present

## 2014-08-22 DIAGNOSIS — Z992 Dependence on renal dialysis: Secondary | ICD-10-CM | POA: Diagnosis not present

## 2014-08-24 DIAGNOSIS — D631 Anemia in chronic kidney disease: Secondary | ICD-10-CM | POA: Diagnosis not present

## 2014-08-24 DIAGNOSIS — N186 End stage renal disease: Secondary | ICD-10-CM | POA: Diagnosis not present

## 2014-08-24 DIAGNOSIS — Z992 Dependence on renal dialysis: Secondary | ICD-10-CM | POA: Diagnosis not present

## 2014-08-24 DIAGNOSIS — D509 Iron deficiency anemia, unspecified: Secondary | ICD-10-CM | POA: Diagnosis not present

## 2014-08-24 DIAGNOSIS — N2581 Secondary hyperparathyroidism of renal origin: Secondary | ICD-10-CM | POA: Diagnosis not present

## 2014-08-27 DIAGNOSIS — N2581 Secondary hyperparathyroidism of renal origin: Secondary | ICD-10-CM | POA: Diagnosis not present

## 2014-08-27 DIAGNOSIS — D631 Anemia in chronic kidney disease: Secondary | ICD-10-CM | POA: Diagnosis not present

## 2014-08-27 DIAGNOSIS — Z992 Dependence on renal dialysis: Secondary | ICD-10-CM | POA: Diagnosis not present

## 2014-08-27 DIAGNOSIS — N186 End stage renal disease: Secondary | ICD-10-CM | POA: Diagnosis not present

## 2014-08-27 DIAGNOSIS — D509 Iron deficiency anemia, unspecified: Secondary | ICD-10-CM | POA: Diagnosis not present

## 2014-08-29 DIAGNOSIS — N2581 Secondary hyperparathyroidism of renal origin: Secondary | ICD-10-CM | POA: Diagnosis not present

## 2014-08-29 DIAGNOSIS — D631 Anemia in chronic kidney disease: Secondary | ICD-10-CM | POA: Diagnosis not present

## 2014-08-29 DIAGNOSIS — D509 Iron deficiency anemia, unspecified: Secondary | ICD-10-CM | POA: Diagnosis not present

## 2014-08-29 DIAGNOSIS — Z992 Dependence on renal dialysis: Secondary | ICD-10-CM | POA: Diagnosis not present

## 2014-08-29 DIAGNOSIS — N186 End stage renal disease: Secondary | ICD-10-CM | POA: Diagnosis not present

## 2014-08-31 DIAGNOSIS — N186 End stage renal disease: Secondary | ICD-10-CM | POA: Diagnosis not present

## 2014-08-31 DIAGNOSIS — Z992 Dependence on renal dialysis: Secondary | ICD-10-CM | POA: Diagnosis not present

## 2014-08-31 DIAGNOSIS — N2581 Secondary hyperparathyroidism of renal origin: Secondary | ICD-10-CM | POA: Diagnosis not present

## 2014-08-31 DIAGNOSIS — D631 Anemia in chronic kidney disease: Secondary | ICD-10-CM | POA: Diagnosis not present

## 2014-08-31 DIAGNOSIS — D509 Iron deficiency anemia, unspecified: Secondary | ICD-10-CM | POA: Diagnosis not present

## 2014-09-03 DIAGNOSIS — N186 End stage renal disease: Secondary | ICD-10-CM | POA: Diagnosis not present

## 2014-09-03 DIAGNOSIS — D509 Iron deficiency anemia, unspecified: Secondary | ICD-10-CM | POA: Diagnosis not present

## 2014-09-03 DIAGNOSIS — Z992 Dependence on renal dialysis: Secondary | ICD-10-CM | POA: Diagnosis not present

## 2014-09-03 DIAGNOSIS — N2581 Secondary hyperparathyroidism of renal origin: Secondary | ICD-10-CM | POA: Diagnosis not present

## 2014-09-03 DIAGNOSIS — D631 Anemia in chronic kidney disease: Secondary | ICD-10-CM | POA: Diagnosis not present

## 2014-09-05 DIAGNOSIS — N186 End stage renal disease: Secondary | ICD-10-CM | POA: Diagnosis not present

## 2014-09-05 DIAGNOSIS — Z992 Dependence on renal dialysis: Secondary | ICD-10-CM | POA: Diagnosis not present

## 2014-09-05 DIAGNOSIS — D631 Anemia in chronic kidney disease: Secondary | ICD-10-CM | POA: Diagnosis not present

## 2014-09-05 DIAGNOSIS — N2581 Secondary hyperparathyroidism of renal origin: Secondary | ICD-10-CM | POA: Diagnosis not present

## 2014-09-05 DIAGNOSIS — D509 Iron deficiency anemia, unspecified: Secondary | ICD-10-CM | POA: Diagnosis not present

## 2014-09-07 DIAGNOSIS — N2581 Secondary hyperparathyroidism of renal origin: Secondary | ICD-10-CM | POA: Diagnosis not present

## 2014-09-07 DIAGNOSIS — D631 Anemia in chronic kidney disease: Secondary | ICD-10-CM | POA: Diagnosis not present

## 2014-09-07 DIAGNOSIS — Z992 Dependence on renal dialysis: Secondary | ICD-10-CM | POA: Diagnosis not present

## 2014-09-07 DIAGNOSIS — N186 End stage renal disease: Secondary | ICD-10-CM | POA: Diagnosis not present

## 2014-09-07 DIAGNOSIS — D509 Iron deficiency anemia, unspecified: Secondary | ICD-10-CM | POA: Diagnosis not present

## 2014-09-10 DIAGNOSIS — D631 Anemia in chronic kidney disease: Secondary | ICD-10-CM | POA: Diagnosis not present

## 2014-09-10 DIAGNOSIS — Z992 Dependence on renal dialysis: Secondary | ICD-10-CM | POA: Diagnosis not present

## 2014-09-10 DIAGNOSIS — N186 End stage renal disease: Secondary | ICD-10-CM | POA: Diagnosis not present

## 2014-09-10 DIAGNOSIS — D509 Iron deficiency anemia, unspecified: Secondary | ICD-10-CM | POA: Diagnosis not present

## 2014-09-10 DIAGNOSIS — N2581 Secondary hyperparathyroidism of renal origin: Secondary | ICD-10-CM | POA: Diagnosis not present

## 2014-09-12 DIAGNOSIS — N186 End stage renal disease: Secondary | ICD-10-CM | POA: Diagnosis not present

## 2014-09-12 DIAGNOSIS — D509 Iron deficiency anemia, unspecified: Secondary | ICD-10-CM | POA: Diagnosis not present

## 2014-09-12 DIAGNOSIS — N2581 Secondary hyperparathyroidism of renal origin: Secondary | ICD-10-CM | POA: Diagnosis not present

## 2014-09-12 DIAGNOSIS — Z992 Dependence on renal dialysis: Secondary | ICD-10-CM | POA: Diagnosis not present

## 2014-09-12 DIAGNOSIS — D631 Anemia in chronic kidney disease: Secondary | ICD-10-CM | POA: Diagnosis not present

## 2014-09-14 DIAGNOSIS — D509 Iron deficiency anemia, unspecified: Secondary | ICD-10-CM | POA: Diagnosis not present

## 2014-09-14 DIAGNOSIS — N186 End stage renal disease: Secondary | ICD-10-CM | POA: Diagnosis not present

## 2014-09-14 DIAGNOSIS — N2581 Secondary hyperparathyroidism of renal origin: Secondary | ICD-10-CM | POA: Diagnosis not present

## 2014-09-14 DIAGNOSIS — Z992 Dependence on renal dialysis: Secondary | ICD-10-CM | POA: Diagnosis not present

## 2014-09-14 DIAGNOSIS — D631 Anemia in chronic kidney disease: Secondary | ICD-10-CM | POA: Diagnosis not present

## 2014-09-15 NOTE — Op Note (Signed)
PATIENT NAME:  Brian Barrera, Brian Barrera MR#:  [id number] DATE OF BIRTH:  21-Jul-1970  DATE OF PROCEDURE:  09/15/2011  PREOPERATIVE DIAGNOSES:  1. End-stage renal disease.  2. Hypertension.   POSTOPERATIVE DIAGNOSES:  1. End-stage renal disease. 2. Hypertension.  PROCEDURE: Right radiocephalic arteriovenous fistula.   SURGEON: Algernon Huxley, MD   ANESTHESIA: General.   ESTIMATED BLOOD LOSS: Approximately 25 mL.   INDICATION FOR PROCEDURE: The patient is a 44 year old African American male with end-stage renal disease. He has had previous access in his left arms which have failed. He is currently catheter-dependent. His vein mapping showed an adequate cephalic vein in the right arm and an adequate radial artery for radiocephalic AV fistula creation. He is brought to the Operating Room for his permanent dialysis access.   DESCRIPTION OF PROCEDURE: The patient was brought to the Vascular Interventional Radiology Suite. The right upper extremity was sterilely prepped and draped, and a sterile surgical field was created. An incision was created between the palpable radial artery and the cephalic vein. The cephalic vein was dissected out and marked for orientation. Three venous branches were ligated and divided between silk ties. The radial artery was then dissected out and encircled with vessel loops proximally and distally, and 3000 units of intravenous heparin were given for systemic anticoagulation, and control was pulled up on the vessel loops. An anterior arteriotomy was created with an 11 blade and extended with Potts scissors. The vein was then ligated distally, cut and beveled to an appropriate length to match the arteriotomy, and an anastomosis was created with a running 6-0 Prolene suture in the usual fashion. The vessel was flushed and de-aired prior to release of control. On release, three 6-0 Prolene patch sutures were used for hemostasis. Hemostasis was complete. The wound was then closed with a  running 3-0 Vicryl and 4-0 Monocryl. Dermabond was placed as a dressing. The patient tolerated the procedure well and was taken to the recovery room in stable condition.    ____________________________ Algernon Huxley, MD jsd:cbb D: 09/15/2011 13:13:12 ET T: 09/15/2011 13:24:54 ET JOB#: [id number]  cc: Algernon Huxley, MD, <Dictator> Algernon Huxley MD ELECTRONICALLY SIGNED 09/15/2011 16:01

## 2014-09-15 NOTE — Op Note (Signed)
PATIENT NAME:  Brian Barrera, Brian Barrera MR#:  [id number] DATE OF BIRTH:  1971-03-08  DATE OF PROCEDURE:  08/06/2011  PREOPERATIVE DIAGNOSES:  1. Thrombosis AV shunt, left arm.  2. Complication AV dialysis device with stenosis venous outflow with aneurysmal dilatation.  3. End-stage renal disease requiring hemodialysis.  4. Hypertension.   POSTOPERATIVE DIAGNOSES:  1. Thrombosis AV shunt, left arm.  2. Complication AV dialysis device with stenosis venous outflow with aneurysmal dilatation.  3. End-stage renal disease requiring hemodialysis.  4. Hypertension.   PROCEDURES PERFORMED:  1. Contrast injection of left forearm AV loop graft.  2. Percutaneous transluminal angioplasty to 6 mm venous outflow, cephalic vein, above the elbow.  3. Insertion of right IJ cuffed tunneled dialysis catheter with ultrasound guidance.   SURGEON: Katha Cabal, M.D.   SEDATION: Versed 3 mg plus fentanyl 150 mcg administered IV. Continuous ECG, pulse oximetry and cardiopulmonary monitoring was performed throughout the entire procedure by the interventional radiology nurse. Total sedation time was 1 hour and 15 minutes.   ACCESSES:  1. 6 French sheath left forearm loop graft in an antegrade direction.  2. Right IJ approach for tunneled catheter.   CONTRAST USED: Isovue - approximately 45 mL.   FLUOROSCOPY TIME: 4.8 minutes.   INDICATIONS: Brian Barrera is a 44 year old gentleman who presented to the office with thrombosis of his AV graft. He is therefore undergoing attempts at salvage. Risks and benefits were reviewed, all questions are answered, and the patient agrees to proceed.   DESCRIPTION OF PROCEDURE: The patient is taken to special procedures and placed in the supine position with his left arm extended palm upward. The left arm is prepped and draped in a sterile fashion. 1% lidocaine is infiltrated in the soft tissues overlying the AV loop graft, which is easy to palpate, and a micropuncture needle is  inserted without difficult, microwire followed by microsheath. Aspiration from microsheath returns some serosanguineous fluid with thrombus in it. The J-wire is then advanced followed by placement of a 6 French sheath. Using a combination of the KMP catheter and a floppy Glidewire, the graft is negotiated at the venous anastomosis, which essentially is in the antecubital fossa. A large 4 to 5 cm venous aneurysm is noted and hand injection at this level demonstrates the aneurysm is filled with thrombus. Outflow is noted. There is greater than 90% stenosis, which was noted on the previous study, as was this aneurysm. Wire is then negotiated into the more proximal cephalic vein where the vein is patent and into the subclavian vein which is patent as well, and the central veins appear patent.   Magic torque wire is then advanced through the KMP catheter and a 6 x 8 Conquest balloon is used to insufflate the multiple stenoses of the cephalic vein, in its distal half, down to the level of the antecubital fossa and the aneurysm. Follow-up angiography now demonstrates that thrombus has propagated into the cephalic vein, at the level of the deltoid. Brief attempt at salvage in the graft with the Trerotola device is unsuccessful and further attempts are felt to be hazardous secondary to this very large extensive clot burden that is located within the aneurysmal segment and therefore it would represent large burden of thrombus with potential to embolize into the pulmonary vasculature. At this point, based on the multitude of problems with this graft, venography is performed to demonstrate that the axillary vein is patent, as is the subclavian, and therefore the patient would best be suited with creation of  a new brachial axillary dialysis graft. I believe that further attempts at thrombectomy are hazardous given the large burden of thrombus and the potential for lethal pulmonary embolism. Therefore, I elected to prep and  drape the patient's right neck and chest wall. Ultrasound is placed in a sterile sleeve. Ultrasound is utilized secondary to lack of appropriate landmarks to avoid vascular injury. Under direct ultrasound visualization, the jugular vein is identified. It is echolucent, homogeneous, and easily compressible indicating patency. The image is recorded for the permanent record and, under direct ultrasound visualization, the micropuncture needle is inserted into the jugular vein, microwire followed by microsheath. A counterincision is created with an 11 blade and a small pocket fashioned with a hemostat. J-wire is then advanced under fluoroscopy into the inferior vena cava. Serial dilatation is then performed and a peel-away sheath with a hemostatic valve is inserted without difficulty. A 23 cm tip to cuff Cannon catheter is the catheter that was selected and it is then inserted through the peel-away sheath. The peel-away sheath is removed. Under fluoroscopy the tips are positioned at the level of the atrium and an exit site on the chest wall is then selected. 1% lidocaine with epinephrine is then infiltrated into the soft tissues, a small incision is created on the chest wall and the tunneling device is passed from the chest wall incision to the neck counterincision, dilator is passed over the tunneling device, and the catheter is then pulled subcutaneously. Hub assembly is then connected without difficulty and both lumens aspirate and flush easily. The catheter is verified for tip length under fluoroscopy as well as contour, which is nice and smooth and free of kinks. The neck counterincision is then closed with a 4-0 Monocryl subcuticular and Dermabond. The catheter is secured to the chest wall with 0 silk x2, and a sterile dressing is applied. The patient tolerated the procedure well and there were no complications. He is taken to the recovery area in stable condition. He will follow up with me in the office next week  for planning of his left brachial axillary dialysis graft.  ____________________________ Katha Cabal, MD ggs:slb D: 08/06/2011 19:42:27 ET T: 08/07/2011 10:43:18 ET JOB#: [id number]  cc: Katha Cabal, MD, <Dictator> Katha Cabal MD ELECTRONICALLY SIGNED 08/10/2011 12:22

## 2014-09-17 DIAGNOSIS — Z992 Dependence on renal dialysis: Secondary | ICD-10-CM | POA: Diagnosis not present

## 2014-09-17 DIAGNOSIS — N186 End stage renal disease: Secondary | ICD-10-CM | POA: Diagnosis not present

## 2014-09-17 DIAGNOSIS — D631 Anemia in chronic kidney disease: Secondary | ICD-10-CM | POA: Diagnosis not present

## 2014-09-17 DIAGNOSIS — D509 Iron deficiency anemia, unspecified: Secondary | ICD-10-CM | POA: Diagnosis not present

## 2014-09-17 DIAGNOSIS — N2581 Secondary hyperparathyroidism of renal origin: Secondary | ICD-10-CM | POA: Diagnosis not present

## 2014-09-18 DIAGNOSIS — N186 End stage renal disease: Secondary | ICD-10-CM | POA: Diagnosis not present

## 2014-09-18 DIAGNOSIS — J449 Chronic obstructive pulmonary disease, unspecified: Secondary | ICD-10-CM | POA: Diagnosis not present

## 2014-09-18 DIAGNOSIS — I10 Essential (primary) hypertension: Secondary | ICD-10-CM | POA: Diagnosis not present

## 2014-09-18 DIAGNOSIS — M545 Low back pain: Secondary | ICD-10-CM | POA: Diagnosis not present

## 2014-09-19 DIAGNOSIS — N2581 Secondary hyperparathyroidism of renal origin: Secondary | ICD-10-CM | POA: Diagnosis not present

## 2014-09-19 DIAGNOSIS — D509 Iron deficiency anemia, unspecified: Secondary | ICD-10-CM | POA: Diagnosis not present

## 2014-09-19 DIAGNOSIS — D631 Anemia in chronic kidney disease: Secondary | ICD-10-CM | POA: Diagnosis not present

## 2014-09-19 DIAGNOSIS — N186 End stage renal disease: Secondary | ICD-10-CM | POA: Diagnosis not present

## 2014-09-19 DIAGNOSIS — Z992 Dependence on renal dialysis: Secondary | ICD-10-CM | POA: Diagnosis not present

## 2014-09-21 DIAGNOSIS — N186 End stage renal disease: Secondary | ICD-10-CM | POA: Diagnosis not present

## 2014-09-21 DIAGNOSIS — D509 Iron deficiency anemia, unspecified: Secondary | ICD-10-CM | POA: Diagnosis not present

## 2014-09-21 DIAGNOSIS — N2581 Secondary hyperparathyroidism of renal origin: Secondary | ICD-10-CM | POA: Diagnosis not present

## 2014-09-21 DIAGNOSIS — Z992 Dependence on renal dialysis: Secondary | ICD-10-CM | POA: Diagnosis not present

## 2014-09-21 DIAGNOSIS — D631 Anemia in chronic kidney disease: Secondary | ICD-10-CM | POA: Diagnosis not present

## 2014-09-24 DIAGNOSIS — Z992 Dependence on renal dialysis: Secondary | ICD-10-CM | POA: Diagnosis not present

## 2014-09-24 DIAGNOSIS — D631 Anemia in chronic kidney disease: Secondary | ICD-10-CM | POA: Diagnosis not present

## 2014-09-24 DIAGNOSIS — E611 Iron deficiency: Secondary | ICD-10-CM | POA: Diagnosis not present

## 2014-09-24 DIAGNOSIS — N186 End stage renal disease: Secondary | ICD-10-CM | POA: Diagnosis not present

## 2014-09-24 DIAGNOSIS — N2581 Secondary hyperparathyroidism of renal origin: Secondary | ICD-10-CM | POA: Diagnosis not present

## 2014-09-26 DIAGNOSIS — D631 Anemia in chronic kidney disease: Secondary | ICD-10-CM | POA: Diagnosis not present

## 2014-09-26 DIAGNOSIS — Z992 Dependence on renal dialysis: Secondary | ICD-10-CM | POA: Diagnosis not present

## 2014-09-26 DIAGNOSIS — N186 End stage renal disease: Secondary | ICD-10-CM | POA: Diagnosis not present

## 2014-09-26 DIAGNOSIS — N2581 Secondary hyperparathyroidism of renal origin: Secondary | ICD-10-CM | POA: Diagnosis not present

## 2014-09-26 DIAGNOSIS — E611 Iron deficiency: Secondary | ICD-10-CM | POA: Diagnosis not present

## 2014-09-28 DIAGNOSIS — N186 End stage renal disease: Secondary | ICD-10-CM | POA: Diagnosis not present

## 2014-09-28 DIAGNOSIS — N2581 Secondary hyperparathyroidism of renal origin: Secondary | ICD-10-CM | POA: Diagnosis not present

## 2014-09-28 DIAGNOSIS — E611 Iron deficiency: Secondary | ICD-10-CM | POA: Diagnosis not present

## 2014-09-28 DIAGNOSIS — Z992 Dependence on renal dialysis: Secondary | ICD-10-CM | POA: Diagnosis not present

## 2014-09-28 DIAGNOSIS — D631 Anemia in chronic kidney disease: Secondary | ICD-10-CM | POA: Diagnosis not present

## 2014-10-01 DIAGNOSIS — D631 Anemia in chronic kidney disease: Secondary | ICD-10-CM | POA: Diagnosis not present

## 2014-10-01 DIAGNOSIS — E611 Iron deficiency: Secondary | ICD-10-CM | POA: Diagnosis not present

## 2014-10-01 DIAGNOSIS — N186 End stage renal disease: Secondary | ICD-10-CM | POA: Diagnosis not present

## 2014-10-01 DIAGNOSIS — Z992 Dependence on renal dialysis: Secondary | ICD-10-CM | POA: Diagnosis not present

## 2014-10-01 DIAGNOSIS — N2581 Secondary hyperparathyroidism of renal origin: Secondary | ICD-10-CM | POA: Diagnosis not present

## 2014-10-03 DIAGNOSIS — N2581 Secondary hyperparathyroidism of renal origin: Secondary | ICD-10-CM | POA: Diagnosis not present

## 2014-10-03 DIAGNOSIS — E611 Iron deficiency: Secondary | ICD-10-CM | POA: Diagnosis not present

## 2014-10-03 DIAGNOSIS — Z992 Dependence on renal dialysis: Secondary | ICD-10-CM | POA: Diagnosis not present

## 2014-10-03 DIAGNOSIS — N186 End stage renal disease: Secondary | ICD-10-CM | POA: Diagnosis not present

## 2014-10-03 DIAGNOSIS — D631 Anemia in chronic kidney disease: Secondary | ICD-10-CM | POA: Diagnosis not present

## 2014-10-05 DIAGNOSIS — D631 Anemia in chronic kidney disease: Secondary | ICD-10-CM | POA: Diagnosis not present

## 2014-10-05 DIAGNOSIS — E611 Iron deficiency: Secondary | ICD-10-CM | POA: Diagnosis not present

## 2014-10-05 DIAGNOSIS — Z992 Dependence on renal dialysis: Secondary | ICD-10-CM | POA: Diagnosis not present

## 2014-10-05 DIAGNOSIS — N186 End stage renal disease: Secondary | ICD-10-CM | POA: Diagnosis not present

## 2014-10-05 DIAGNOSIS — N2581 Secondary hyperparathyroidism of renal origin: Secondary | ICD-10-CM | POA: Diagnosis not present

## 2014-10-08 DIAGNOSIS — N186 End stage renal disease: Secondary | ICD-10-CM | POA: Diagnosis not present

## 2014-10-08 DIAGNOSIS — E611 Iron deficiency: Secondary | ICD-10-CM | POA: Diagnosis not present

## 2014-10-08 DIAGNOSIS — N2581 Secondary hyperparathyroidism of renal origin: Secondary | ICD-10-CM | POA: Diagnosis not present

## 2014-10-08 DIAGNOSIS — Z992 Dependence on renal dialysis: Secondary | ICD-10-CM | POA: Diagnosis not present

## 2014-10-08 DIAGNOSIS — D631 Anemia in chronic kidney disease: Secondary | ICD-10-CM | POA: Diagnosis not present

## 2014-10-10 DIAGNOSIS — N186 End stage renal disease: Secondary | ICD-10-CM | POA: Diagnosis not present

## 2014-10-10 DIAGNOSIS — E611 Iron deficiency: Secondary | ICD-10-CM | POA: Diagnosis not present

## 2014-10-10 DIAGNOSIS — N2581 Secondary hyperparathyroidism of renal origin: Secondary | ICD-10-CM | POA: Diagnosis not present

## 2014-10-10 DIAGNOSIS — Z992 Dependence on renal dialysis: Secondary | ICD-10-CM | POA: Diagnosis not present

## 2014-10-10 DIAGNOSIS — D631 Anemia in chronic kidney disease: Secondary | ICD-10-CM | POA: Diagnosis not present

## 2014-10-12 DIAGNOSIS — N186 End stage renal disease: Secondary | ICD-10-CM | POA: Diagnosis not present

## 2014-10-12 DIAGNOSIS — E611 Iron deficiency: Secondary | ICD-10-CM | POA: Diagnosis not present

## 2014-10-12 DIAGNOSIS — D631 Anemia in chronic kidney disease: Secondary | ICD-10-CM | POA: Diagnosis not present

## 2014-10-12 DIAGNOSIS — N2581 Secondary hyperparathyroidism of renal origin: Secondary | ICD-10-CM | POA: Diagnosis not present

## 2014-10-12 DIAGNOSIS — Z992 Dependence on renal dialysis: Secondary | ICD-10-CM | POA: Diagnosis not present

## 2014-10-15 DIAGNOSIS — E611 Iron deficiency: Secondary | ICD-10-CM | POA: Diagnosis not present

## 2014-10-15 DIAGNOSIS — N2581 Secondary hyperparathyroidism of renal origin: Secondary | ICD-10-CM | POA: Diagnosis not present

## 2014-10-15 DIAGNOSIS — Z992 Dependence on renal dialysis: Secondary | ICD-10-CM | POA: Diagnosis not present

## 2014-10-15 DIAGNOSIS — N186 End stage renal disease: Secondary | ICD-10-CM | POA: Diagnosis not present

## 2014-10-15 DIAGNOSIS — D631 Anemia in chronic kidney disease: Secondary | ICD-10-CM | POA: Diagnosis not present

## 2014-10-17 DIAGNOSIS — N186 End stage renal disease: Secondary | ICD-10-CM | POA: Diagnosis not present

## 2014-10-17 DIAGNOSIS — Z992 Dependence on renal dialysis: Secondary | ICD-10-CM | POA: Diagnosis not present

## 2014-10-17 DIAGNOSIS — N2581 Secondary hyperparathyroidism of renal origin: Secondary | ICD-10-CM | POA: Diagnosis not present

## 2014-10-17 DIAGNOSIS — D631 Anemia in chronic kidney disease: Secondary | ICD-10-CM | POA: Diagnosis not present

## 2014-10-17 DIAGNOSIS — E611 Iron deficiency: Secondary | ICD-10-CM | POA: Diagnosis not present

## 2014-10-19 DIAGNOSIS — N2581 Secondary hyperparathyroidism of renal origin: Secondary | ICD-10-CM | POA: Diagnosis not present

## 2014-10-19 DIAGNOSIS — E611 Iron deficiency: Secondary | ICD-10-CM | POA: Diagnosis not present

## 2014-10-19 DIAGNOSIS — D631 Anemia in chronic kidney disease: Secondary | ICD-10-CM | POA: Diagnosis not present

## 2014-10-19 DIAGNOSIS — N186 End stage renal disease: Secondary | ICD-10-CM | POA: Diagnosis not present

## 2014-10-19 DIAGNOSIS — Z992 Dependence on renal dialysis: Secondary | ICD-10-CM | POA: Diagnosis not present

## 2014-10-22 DIAGNOSIS — Z992 Dependence on renal dialysis: Secondary | ICD-10-CM | POA: Diagnosis not present

## 2014-10-22 DIAGNOSIS — N2581 Secondary hyperparathyroidism of renal origin: Secondary | ICD-10-CM | POA: Diagnosis not present

## 2014-10-22 DIAGNOSIS — D631 Anemia in chronic kidney disease: Secondary | ICD-10-CM | POA: Diagnosis not present

## 2014-10-22 DIAGNOSIS — E611 Iron deficiency: Secondary | ICD-10-CM | POA: Diagnosis not present

## 2014-10-22 DIAGNOSIS — N186 End stage renal disease: Secondary | ICD-10-CM | POA: Diagnosis not present

## 2014-10-24 DIAGNOSIS — D631 Anemia in chronic kidney disease: Secondary | ICD-10-CM | POA: Diagnosis not present

## 2014-10-24 DIAGNOSIS — N2581 Secondary hyperparathyroidism of renal origin: Secondary | ICD-10-CM | POA: Diagnosis not present

## 2014-10-24 DIAGNOSIS — E611 Iron deficiency: Secondary | ICD-10-CM | POA: Diagnosis not present

## 2014-10-24 DIAGNOSIS — N186 End stage renal disease: Secondary | ICD-10-CM | POA: Diagnosis not present

## 2014-10-24 DIAGNOSIS — Z992 Dependence on renal dialysis: Secondary | ICD-10-CM | POA: Diagnosis not present

## 2014-11-21 DIAGNOSIS — N186 End stage renal disease: Secondary | ICD-10-CM | POA: Diagnosis not present

## 2014-11-21 DIAGNOSIS — Z992 Dependence on renal dialysis: Secondary | ICD-10-CM | POA: Diagnosis not present

## 2014-11-23 DIAGNOSIS — N2581 Secondary hyperparathyroidism of renal origin: Secondary | ICD-10-CM | POA: Diagnosis not present

## 2014-11-23 DIAGNOSIS — D631 Anemia in chronic kidney disease: Secondary | ICD-10-CM | POA: Diagnosis not present

## 2014-11-23 DIAGNOSIS — Z992 Dependence on renal dialysis: Secondary | ICD-10-CM | POA: Diagnosis not present

## 2014-11-23 DIAGNOSIS — N186 End stage renal disease: Secondary | ICD-10-CM | POA: Diagnosis not present

## 2014-11-23 DIAGNOSIS — D509 Iron deficiency anemia, unspecified: Secondary | ICD-10-CM | POA: Diagnosis not present

## 2014-11-26 DIAGNOSIS — N2581 Secondary hyperparathyroidism of renal origin: Secondary | ICD-10-CM | POA: Diagnosis not present

## 2014-11-26 DIAGNOSIS — D509 Iron deficiency anemia, unspecified: Secondary | ICD-10-CM | POA: Diagnosis not present

## 2014-11-26 DIAGNOSIS — D631 Anemia in chronic kidney disease: Secondary | ICD-10-CM | POA: Diagnosis not present

## 2014-11-26 DIAGNOSIS — Z992 Dependence on renal dialysis: Secondary | ICD-10-CM | POA: Diagnosis not present

## 2014-11-26 DIAGNOSIS — N186 End stage renal disease: Secondary | ICD-10-CM | POA: Diagnosis not present

## 2014-11-28 DIAGNOSIS — D509 Iron deficiency anemia, unspecified: Secondary | ICD-10-CM | POA: Diagnosis not present

## 2014-11-28 DIAGNOSIS — Z992 Dependence on renal dialysis: Secondary | ICD-10-CM | POA: Diagnosis not present

## 2014-11-28 DIAGNOSIS — D631 Anemia in chronic kidney disease: Secondary | ICD-10-CM | POA: Diagnosis not present

## 2014-11-28 DIAGNOSIS — N2581 Secondary hyperparathyroidism of renal origin: Secondary | ICD-10-CM | POA: Diagnosis not present

## 2014-11-28 DIAGNOSIS — N186 End stage renal disease: Secondary | ICD-10-CM | POA: Diagnosis not present

## 2014-11-30 DIAGNOSIS — N186 End stage renal disease: Secondary | ICD-10-CM | POA: Diagnosis not present

## 2014-11-30 DIAGNOSIS — N2581 Secondary hyperparathyroidism of renal origin: Secondary | ICD-10-CM | POA: Diagnosis not present

## 2014-11-30 DIAGNOSIS — D509 Iron deficiency anemia, unspecified: Secondary | ICD-10-CM | POA: Diagnosis not present

## 2014-11-30 DIAGNOSIS — D631 Anemia in chronic kidney disease: Secondary | ICD-10-CM | POA: Diagnosis not present

## 2014-11-30 DIAGNOSIS — Z992 Dependence on renal dialysis: Secondary | ICD-10-CM | POA: Diagnosis not present

## 2014-12-03 DIAGNOSIS — N186 End stage renal disease: Secondary | ICD-10-CM | POA: Diagnosis not present

## 2014-12-03 DIAGNOSIS — Z992 Dependence on renal dialysis: Secondary | ICD-10-CM | POA: Diagnosis not present

## 2014-12-03 DIAGNOSIS — N2581 Secondary hyperparathyroidism of renal origin: Secondary | ICD-10-CM | POA: Diagnosis not present

## 2014-12-03 DIAGNOSIS — D631 Anemia in chronic kidney disease: Secondary | ICD-10-CM | POA: Diagnosis not present

## 2014-12-03 DIAGNOSIS — D509 Iron deficiency anemia, unspecified: Secondary | ICD-10-CM | POA: Diagnosis not present

## 2014-12-05 DIAGNOSIS — N186 End stage renal disease: Secondary | ICD-10-CM | POA: Diagnosis not present

## 2014-12-05 DIAGNOSIS — Z992 Dependence on renal dialysis: Secondary | ICD-10-CM | POA: Diagnosis not present

## 2014-12-05 DIAGNOSIS — D631 Anemia in chronic kidney disease: Secondary | ICD-10-CM | POA: Diagnosis not present

## 2014-12-05 DIAGNOSIS — N2581 Secondary hyperparathyroidism of renal origin: Secondary | ICD-10-CM | POA: Diagnosis not present

## 2014-12-05 DIAGNOSIS — D509 Iron deficiency anemia, unspecified: Secondary | ICD-10-CM | POA: Diagnosis not present

## 2014-12-07 DIAGNOSIS — N2581 Secondary hyperparathyroidism of renal origin: Secondary | ICD-10-CM | POA: Diagnosis not present

## 2014-12-07 DIAGNOSIS — N186 End stage renal disease: Secondary | ICD-10-CM | POA: Diagnosis not present

## 2014-12-07 DIAGNOSIS — Z992 Dependence on renal dialysis: Secondary | ICD-10-CM | POA: Diagnosis not present

## 2014-12-07 DIAGNOSIS — D631 Anemia in chronic kidney disease: Secondary | ICD-10-CM | POA: Diagnosis not present

## 2014-12-07 DIAGNOSIS — D509 Iron deficiency anemia, unspecified: Secondary | ICD-10-CM | POA: Diagnosis not present

## 2014-12-10 DIAGNOSIS — D631 Anemia in chronic kidney disease: Secondary | ICD-10-CM | POA: Diagnosis not present

## 2014-12-10 DIAGNOSIS — D509 Iron deficiency anemia, unspecified: Secondary | ICD-10-CM | POA: Diagnosis not present

## 2014-12-10 DIAGNOSIS — Z992 Dependence on renal dialysis: Secondary | ICD-10-CM | POA: Diagnosis not present

## 2014-12-10 DIAGNOSIS — N186 End stage renal disease: Secondary | ICD-10-CM | POA: Diagnosis not present

## 2014-12-10 DIAGNOSIS — N2581 Secondary hyperparathyroidism of renal origin: Secondary | ICD-10-CM | POA: Diagnosis not present

## 2014-12-12 DIAGNOSIS — D509 Iron deficiency anemia, unspecified: Secondary | ICD-10-CM | POA: Diagnosis not present

## 2014-12-12 DIAGNOSIS — D631 Anemia in chronic kidney disease: Secondary | ICD-10-CM | POA: Diagnosis not present

## 2014-12-12 DIAGNOSIS — N186 End stage renal disease: Secondary | ICD-10-CM | POA: Diagnosis not present

## 2014-12-12 DIAGNOSIS — Z992 Dependence on renal dialysis: Secondary | ICD-10-CM | POA: Diagnosis not present

## 2014-12-12 DIAGNOSIS — N2581 Secondary hyperparathyroidism of renal origin: Secondary | ICD-10-CM | POA: Diagnosis not present

## 2014-12-14 DIAGNOSIS — N2581 Secondary hyperparathyroidism of renal origin: Secondary | ICD-10-CM | POA: Diagnosis not present

## 2014-12-14 DIAGNOSIS — D631 Anemia in chronic kidney disease: Secondary | ICD-10-CM | POA: Diagnosis not present

## 2014-12-14 DIAGNOSIS — D509 Iron deficiency anemia, unspecified: Secondary | ICD-10-CM | POA: Diagnosis not present

## 2014-12-14 DIAGNOSIS — Z992 Dependence on renal dialysis: Secondary | ICD-10-CM | POA: Diagnosis not present

## 2014-12-14 DIAGNOSIS — N186 End stage renal disease: Secondary | ICD-10-CM | POA: Diagnosis not present

## 2014-12-17 DIAGNOSIS — N186 End stage renal disease: Secondary | ICD-10-CM | POA: Diagnosis not present

## 2014-12-17 DIAGNOSIS — N2581 Secondary hyperparathyroidism of renal origin: Secondary | ICD-10-CM | POA: Diagnosis not present

## 2014-12-17 DIAGNOSIS — D509 Iron deficiency anemia, unspecified: Secondary | ICD-10-CM | POA: Diagnosis not present

## 2014-12-17 DIAGNOSIS — D631 Anemia in chronic kidney disease: Secondary | ICD-10-CM | POA: Diagnosis not present

## 2014-12-17 DIAGNOSIS — Z992 Dependence on renal dialysis: Secondary | ICD-10-CM | POA: Diagnosis not present

## 2014-12-19 DIAGNOSIS — D509 Iron deficiency anemia, unspecified: Secondary | ICD-10-CM | POA: Diagnosis not present

## 2014-12-19 DIAGNOSIS — Z992 Dependence on renal dialysis: Secondary | ICD-10-CM | POA: Diagnosis not present

## 2014-12-19 DIAGNOSIS — N186 End stage renal disease: Secondary | ICD-10-CM | POA: Diagnosis not present

## 2014-12-19 DIAGNOSIS — N2581 Secondary hyperparathyroidism of renal origin: Secondary | ICD-10-CM | POA: Diagnosis not present

## 2014-12-19 DIAGNOSIS — D631 Anemia in chronic kidney disease: Secondary | ICD-10-CM | POA: Diagnosis not present

## 2014-12-21 DIAGNOSIS — N186 End stage renal disease: Secondary | ICD-10-CM | POA: Diagnosis not present

## 2014-12-21 DIAGNOSIS — N2581 Secondary hyperparathyroidism of renal origin: Secondary | ICD-10-CM | POA: Diagnosis not present

## 2014-12-21 DIAGNOSIS — Z992 Dependence on renal dialysis: Secondary | ICD-10-CM | POA: Diagnosis not present

## 2014-12-21 DIAGNOSIS — D631 Anemia in chronic kidney disease: Secondary | ICD-10-CM | POA: Diagnosis not present

## 2014-12-21 DIAGNOSIS — D509 Iron deficiency anemia, unspecified: Secondary | ICD-10-CM | POA: Diagnosis not present

## 2014-12-22 DIAGNOSIS — Z992 Dependence on renal dialysis: Secondary | ICD-10-CM | POA: Diagnosis not present

## 2014-12-22 DIAGNOSIS — N186 End stage renal disease: Secondary | ICD-10-CM | POA: Diagnosis not present

## 2014-12-24 DIAGNOSIS — E611 Iron deficiency: Secondary | ICD-10-CM | POA: Diagnosis not present

## 2014-12-24 DIAGNOSIS — D631 Anemia in chronic kidney disease: Secondary | ICD-10-CM | POA: Diagnosis not present

## 2014-12-24 DIAGNOSIS — N2581 Secondary hyperparathyroidism of renal origin: Secondary | ICD-10-CM | POA: Diagnosis not present

## 2014-12-24 DIAGNOSIS — N186 End stage renal disease: Secondary | ICD-10-CM | POA: Diagnosis not present

## 2014-12-24 DIAGNOSIS — Z992 Dependence on renal dialysis: Secondary | ICD-10-CM | POA: Diagnosis not present

## 2014-12-26 DIAGNOSIS — E611 Iron deficiency: Secondary | ICD-10-CM | POA: Diagnosis not present

## 2014-12-26 DIAGNOSIS — N2581 Secondary hyperparathyroidism of renal origin: Secondary | ICD-10-CM | POA: Diagnosis not present

## 2014-12-26 DIAGNOSIS — Z992 Dependence on renal dialysis: Secondary | ICD-10-CM | POA: Diagnosis not present

## 2014-12-26 DIAGNOSIS — N186 End stage renal disease: Secondary | ICD-10-CM | POA: Diagnosis not present

## 2014-12-26 DIAGNOSIS — D631 Anemia in chronic kidney disease: Secondary | ICD-10-CM | POA: Diagnosis not present

## 2014-12-28 DIAGNOSIS — Z992 Dependence on renal dialysis: Secondary | ICD-10-CM | POA: Diagnosis not present

## 2014-12-28 DIAGNOSIS — E611 Iron deficiency: Secondary | ICD-10-CM | POA: Diagnosis not present

## 2014-12-28 DIAGNOSIS — D631 Anemia in chronic kidney disease: Secondary | ICD-10-CM | POA: Diagnosis not present

## 2014-12-28 DIAGNOSIS — N186 End stage renal disease: Secondary | ICD-10-CM | POA: Diagnosis not present

## 2014-12-28 DIAGNOSIS — N2581 Secondary hyperparathyroidism of renal origin: Secondary | ICD-10-CM | POA: Diagnosis not present

## 2014-12-31 DIAGNOSIS — N186 End stage renal disease: Secondary | ICD-10-CM | POA: Diagnosis not present

## 2014-12-31 DIAGNOSIS — N2581 Secondary hyperparathyroidism of renal origin: Secondary | ICD-10-CM | POA: Diagnosis not present

## 2014-12-31 DIAGNOSIS — E611 Iron deficiency: Secondary | ICD-10-CM | POA: Diagnosis not present

## 2014-12-31 DIAGNOSIS — Z992 Dependence on renal dialysis: Secondary | ICD-10-CM | POA: Diagnosis not present

## 2014-12-31 DIAGNOSIS — D631 Anemia in chronic kidney disease: Secondary | ICD-10-CM | POA: Diagnosis not present

## 2015-01-01 DIAGNOSIS — Z0000 Encounter for general adult medical examination without abnormal findings: Secondary | ICD-10-CM | POA: Diagnosis not present

## 2015-01-02 DIAGNOSIS — D631 Anemia in chronic kidney disease: Secondary | ICD-10-CM | POA: Diagnosis not present

## 2015-01-02 DIAGNOSIS — N186 End stage renal disease: Secondary | ICD-10-CM | POA: Diagnosis not present

## 2015-01-02 DIAGNOSIS — E611 Iron deficiency: Secondary | ICD-10-CM | POA: Diagnosis not present

## 2015-01-02 DIAGNOSIS — N2581 Secondary hyperparathyroidism of renal origin: Secondary | ICD-10-CM | POA: Diagnosis not present

## 2015-01-02 DIAGNOSIS — Z992 Dependence on renal dialysis: Secondary | ICD-10-CM | POA: Diagnosis not present

## 2015-01-04 DIAGNOSIS — D631 Anemia in chronic kidney disease: Secondary | ICD-10-CM | POA: Diagnosis not present

## 2015-01-04 DIAGNOSIS — E611 Iron deficiency: Secondary | ICD-10-CM | POA: Diagnosis not present

## 2015-01-04 DIAGNOSIS — N186 End stage renal disease: Secondary | ICD-10-CM | POA: Diagnosis not present

## 2015-01-04 DIAGNOSIS — Z992 Dependence on renal dialysis: Secondary | ICD-10-CM | POA: Diagnosis not present

## 2015-01-04 DIAGNOSIS — N2581 Secondary hyperparathyroidism of renal origin: Secondary | ICD-10-CM | POA: Diagnosis not present

## 2015-01-07 DIAGNOSIS — D631 Anemia in chronic kidney disease: Secondary | ICD-10-CM | POA: Diagnosis not present

## 2015-01-07 DIAGNOSIS — N186 End stage renal disease: Secondary | ICD-10-CM | POA: Diagnosis not present

## 2015-01-07 DIAGNOSIS — E611 Iron deficiency: Secondary | ICD-10-CM | POA: Diagnosis not present

## 2015-01-07 DIAGNOSIS — N2581 Secondary hyperparathyroidism of renal origin: Secondary | ICD-10-CM | POA: Diagnosis not present

## 2015-01-07 DIAGNOSIS — Z992 Dependence on renal dialysis: Secondary | ICD-10-CM | POA: Diagnosis not present

## 2015-01-09 DIAGNOSIS — Z992 Dependence on renal dialysis: Secondary | ICD-10-CM | POA: Diagnosis not present

## 2015-01-09 DIAGNOSIS — N2581 Secondary hyperparathyroidism of renal origin: Secondary | ICD-10-CM | POA: Diagnosis not present

## 2015-01-09 DIAGNOSIS — D631 Anemia in chronic kidney disease: Secondary | ICD-10-CM | POA: Diagnosis not present

## 2015-01-09 DIAGNOSIS — N186 End stage renal disease: Secondary | ICD-10-CM | POA: Diagnosis not present

## 2015-01-09 DIAGNOSIS — E611 Iron deficiency: Secondary | ICD-10-CM | POA: Diagnosis not present

## 2015-01-11 DIAGNOSIS — D631 Anemia in chronic kidney disease: Secondary | ICD-10-CM | POA: Diagnosis not present

## 2015-01-11 DIAGNOSIS — Z992 Dependence on renal dialysis: Secondary | ICD-10-CM | POA: Diagnosis not present

## 2015-01-11 DIAGNOSIS — N186 End stage renal disease: Secondary | ICD-10-CM | POA: Diagnosis not present

## 2015-01-11 DIAGNOSIS — E611 Iron deficiency: Secondary | ICD-10-CM | POA: Diagnosis not present

## 2015-01-11 DIAGNOSIS — N2581 Secondary hyperparathyroidism of renal origin: Secondary | ICD-10-CM | POA: Diagnosis not present

## 2015-01-14 DIAGNOSIS — N186 End stage renal disease: Secondary | ICD-10-CM | POA: Diagnosis not present

## 2015-01-14 DIAGNOSIS — E611 Iron deficiency: Secondary | ICD-10-CM | POA: Diagnosis not present

## 2015-01-14 DIAGNOSIS — N2581 Secondary hyperparathyroidism of renal origin: Secondary | ICD-10-CM | POA: Diagnosis not present

## 2015-01-14 DIAGNOSIS — D631 Anemia in chronic kidney disease: Secondary | ICD-10-CM | POA: Diagnosis not present

## 2015-01-14 DIAGNOSIS — Z992 Dependence on renal dialysis: Secondary | ICD-10-CM | POA: Diagnosis not present

## 2015-01-16 DIAGNOSIS — D631 Anemia in chronic kidney disease: Secondary | ICD-10-CM | POA: Diagnosis not present

## 2015-01-16 DIAGNOSIS — N186 End stage renal disease: Secondary | ICD-10-CM | POA: Diagnosis not present

## 2015-01-16 DIAGNOSIS — Z992 Dependence on renal dialysis: Secondary | ICD-10-CM | POA: Diagnosis not present

## 2015-01-16 DIAGNOSIS — N2581 Secondary hyperparathyroidism of renal origin: Secondary | ICD-10-CM | POA: Diagnosis not present

## 2015-01-16 DIAGNOSIS — E611 Iron deficiency: Secondary | ICD-10-CM | POA: Diagnosis not present

## 2015-01-18 DIAGNOSIS — Z992 Dependence on renal dialysis: Secondary | ICD-10-CM | POA: Diagnosis not present

## 2015-01-18 DIAGNOSIS — N2581 Secondary hyperparathyroidism of renal origin: Secondary | ICD-10-CM | POA: Diagnosis not present

## 2015-01-18 DIAGNOSIS — E611 Iron deficiency: Secondary | ICD-10-CM | POA: Diagnosis not present

## 2015-01-18 DIAGNOSIS — D631 Anemia in chronic kidney disease: Secondary | ICD-10-CM | POA: Diagnosis not present

## 2015-01-18 DIAGNOSIS — N186 End stage renal disease: Secondary | ICD-10-CM | POA: Diagnosis not present

## 2015-01-21 DIAGNOSIS — E611 Iron deficiency: Secondary | ICD-10-CM | POA: Diagnosis not present

## 2015-01-21 DIAGNOSIS — D631 Anemia in chronic kidney disease: Secondary | ICD-10-CM | POA: Diagnosis not present

## 2015-01-21 DIAGNOSIS — Z992 Dependence on renal dialysis: Secondary | ICD-10-CM | POA: Diagnosis not present

## 2015-01-21 DIAGNOSIS — N186 End stage renal disease: Secondary | ICD-10-CM | POA: Diagnosis not present

## 2015-01-21 DIAGNOSIS — N2581 Secondary hyperparathyroidism of renal origin: Secondary | ICD-10-CM | POA: Diagnosis not present

## 2015-01-22 DIAGNOSIS — N186 End stage renal disease: Secondary | ICD-10-CM | POA: Diagnosis not present

## 2015-01-22 DIAGNOSIS — Z992 Dependence on renal dialysis: Secondary | ICD-10-CM | POA: Diagnosis not present

## 2015-01-23 DIAGNOSIS — D631 Anemia in chronic kidney disease: Secondary | ICD-10-CM | POA: Diagnosis not present

## 2015-01-23 DIAGNOSIS — Z992 Dependence on renal dialysis: Secondary | ICD-10-CM | POA: Diagnosis not present

## 2015-01-23 DIAGNOSIS — Z23 Encounter for immunization: Secondary | ICD-10-CM | POA: Diagnosis not present

## 2015-01-23 DIAGNOSIS — E611 Iron deficiency: Secondary | ICD-10-CM | POA: Diagnosis not present

## 2015-01-23 DIAGNOSIS — N2581 Secondary hyperparathyroidism of renal origin: Secondary | ICD-10-CM | POA: Diagnosis not present

## 2015-01-23 DIAGNOSIS — N186 End stage renal disease: Secondary | ICD-10-CM | POA: Diagnosis not present

## 2015-02-21 DIAGNOSIS — Z992 Dependence on renal dialysis: Secondary | ICD-10-CM | POA: Diagnosis not present

## 2015-02-21 DIAGNOSIS — N186 End stage renal disease: Secondary | ICD-10-CM | POA: Diagnosis not present

## 2015-02-22 DIAGNOSIS — D631 Anemia in chronic kidney disease: Secondary | ICD-10-CM | POA: Diagnosis not present

## 2015-02-22 DIAGNOSIS — E611 Iron deficiency: Secondary | ICD-10-CM | POA: Diagnosis not present

## 2015-02-22 DIAGNOSIS — D509 Iron deficiency anemia, unspecified: Secondary | ICD-10-CM | POA: Diagnosis not present

## 2015-02-22 DIAGNOSIS — Z992 Dependence on renal dialysis: Secondary | ICD-10-CM | POA: Diagnosis not present

## 2015-02-22 DIAGNOSIS — N2581 Secondary hyperparathyroidism of renal origin: Secondary | ICD-10-CM | POA: Diagnosis not present

## 2015-02-22 DIAGNOSIS — N186 End stage renal disease: Secondary | ICD-10-CM | POA: Diagnosis not present

## 2015-02-25 DIAGNOSIS — E611 Iron deficiency: Secondary | ICD-10-CM | POA: Diagnosis not present

## 2015-02-25 DIAGNOSIS — D509 Iron deficiency anemia, unspecified: Secondary | ICD-10-CM | POA: Diagnosis not present

## 2015-02-25 DIAGNOSIS — N2581 Secondary hyperparathyroidism of renal origin: Secondary | ICD-10-CM | POA: Diagnosis not present

## 2015-02-25 DIAGNOSIS — Z992 Dependence on renal dialysis: Secondary | ICD-10-CM | POA: Diagnosis not present

## 2015-02-25 DIAGNOSIS — D631 Anemia in chronic kidney disease: Secondary | ICD-10-CM | POA: Diagnosis not present

## 2015-02-25 DIAGNOSIS — N186 End stage renal disease: Secondary | ICD-10-CM | POA: Diagnosis not present

## 2015-02-27 DIAGNOSIS — Z992 Dependence on renal dialysis: Secondary | ICD-10-CM | POA: Diagnosis not present

## 2015-02-27 DIAGNOSIS — N2581 Secondary hyperparathyroidism of renal origin: Secondary | ICD-10-CM | POA: Diagnosis not present

## 2015-02-27 DIAGNOSIS — D509 Iron deficiency anemia, unspecified: Secondary | ICD-10-CM | POA: Diagnosis not present

## 2015-02-27 DIAGNOSIS — E611 Iron deficiency: Secondary | ICD-10-CM | POA: Diagnosis not present

## 2015-02-27 DIAGNOSIS — D631 Anemia in chronic kidney disease: Secondary | ICD-10-CM | POA: Diagnosis not present

## 2015-02-27 DIAGNOSIS — N186 End stage renal disease: Secondary | ICD-10-CM | POA: Diagnosis not present

## 2015-03-01 DIAGNOSIS — D631 Anemia in chronic kidney disease: Secondary | ICD-10-CM | POA: Diagnosis not present

## 2015-03-01 DIAGNOSIS — E611 Iron deficiency: Secondary | ICD-10-CM | POA: Diagnosis not present

## 2015-03-01 DIAGNOSIS — N2581 Secondary hyperparathyroidism of renal origin: Secondary | ICD-10-CM | POA: Diagnosis not present

## 2015-03-01 DIAGNOSIS — Z992 Dependence on renal dialysis: Secondary | ICD-10-CM | POA: Diagnosis not present

## 2015-03-01 DIAGNOSIS — D509 Iron deficiency anemia, unspecified: Secondary | ICD-10-CM | POA: Diagnosis not present

## 2015-03-01 DIAGNOSIS — N186 End stage renal disease: Secondary | ICD-10-CM | POA: Diagnosis not present

## 2015-03-04 DIAGNOSIS — E611 Iron deficiency: Secondary | ICD-10-CM | POA: Diagnosis not present

## 2015-03-04 DIAGNOSIS — D509 Iron deficiency anemia, unspecified: Secondary | ICD-10-CM | POA: Diagnosis not present

## 2015-03-04 DIAGNOSIS — Z992 Dependence on renal dialysis: Secondary | ICD-10-CM | POA: Diagnosis not present

## 2015-03-04 DIAGNOSIS — N2581 Secondary hyperparathyroidism of renal origin: Secondary | ICD-10-CM | POA: Diagnosis not present

## 2015-03-04 DIAGNOSIS — N186 End stage renal disease: Secondary | ICD-10-CM | POA: Diagnosis not present

## 2015-03-04 DIAGNOSIS — D631 Anemia in chronic kidney disease: Secondary | ICD-10-CM | POA: Diagnosis not present

## 2015-03-06 DIAGNOSIS — N186 End stage renal disease: Secondary | ICD-10-CM | POA: Diagnosis not present

## 2015-03-06 DIAGNOSIS — E611 Iron deficiency: Secondary | ICD-10-CM | POA: Diagnosis not present

## 2015-03-06 DIAGNOSIS — N2581 Secondary hyperparathyroidism of renal origin: Secondary | ICD-10-CM | POA: Diagnosis not present

## 2015-03-06 DIAGNOSIS — D509 Iron deficiency anemia, unspecified: Secondary | ICD-10-CM | POA: Diagnosis not present

## 2015-03-06 DIAGNOSIS — D631 Anemia in chronic kidney disease: Secondary | ICD-10-CM | POA: Diagnosis not present

## 2015-03-06 DIAGNOSIS — Z992 Dependence on renal dialysis: Secondary | ICD-10-CM | POA: Diagnosis not present

## 2015-03-08 DIAGNOSIS — N2581 Secondary hyperparathyroidism of renal origin: Secondary | ICD-10-CM | POA: Diagnosis not present

## 2015-03-08 DIAGNOSIS — E611 Iron deficiency: Secondary | ICD-10-CM | POA: Diagnosis not present

## 2015-03-08 DIAGNOSIS — D631 Anemia in chronic kidney disease: Secondary | ICD-10-CM | POA: Diagnosis not present

## 2015-03-08 DIAGNOSIS — N186 End stage renal disease: Secondary | ICD-10-CM | POA: Diagnosis not present

## 2015-03-08 DIAGNOSIS — D509 Iron deficiency anemia, unspecified: Secondary | ICD-10-CM | POA: Diagnosis not present

## 2015-03-08 DIAGNOSIS — Z992 Dependence on renal dialysis: Secondary | ICD-10-CM | POA: Diagnosis not present

## 2015-03-11 DIAGNOSIS — N2581 Secondary hyperparathyroidism of renal origin: Secondary | ICD-10-CM | POA: Diagnosis not present

## 2015-03-11 DIAGNOSIS — D631 Anemia in chronic kidney disease: Secondary | ICD-10-CM | POA: Diagnosis not present

## 2015-03-11 DIAGNOSIS — N186 End stage renal disease: Secondary | ICD-10-CM | POA: Diagnosis not present

## 2015-03-11 DIAGNOSIS — D509 Iron deficiency anemia, unspecified: Secondary | ICD-10-CM | POA: Diagnosis not present

## 2015-03-11 DIAGNOSIS — Z992 Dependence on renal dialysis: Secondary | ICD-10-CM | POA: Diagnosis not present

## 2015-03-11 DIAGNOSIS — E611 Iron deficiency: Secondary | ICD-10-CM | POA: Diagnosis not present

## 2015-03-13 DIAGNOSIS — Z992 Dependence on renal dialysis: Secondary | ICD-10-CM | POA: Diagnosis not present

## 2015-03-13 DIAGNOSIS — D631 Anemia in chronic kidney disease: Secondary | ICD-10-CM | POA: Diagnosis not present

## 2015-03-13 DIAGNOSIS — D509 Iron deficiency anemia, unspecified: Secondary | ICD-10-CM | POA: Diagnosis not present

## 2015-03-13 DIAGNOSIS — E611 Iron deficiency: Secondary | ICD-10-CM | POA: Diagnosis not present

## 2015-03-13 DIAGNOSIS — N2581 Secondary hyperparathyroidism of renal origin: Secondary | ICD-10-CM | POA: Diagnosis not present

## 2015-03-13 DIAGNOSIS — N186 End stage renal disease: Secondary | ICD-10-CM | POA: Diagnosis not present

## 2015-03-15 DIAGNOSIS — N186 End stage renal disease: Secondary | ICD-10-CM | POA: Diagnosis not present

## 2015-03-15 DIAGNOSIS — Z992 Dependence on renal dialysis: Secondary | ICD-10-CM | POA: Diagnosis not present

## 2015-03-15 DIAGNOSIS — D631 Anemia in chronic kidney disease: Secondary | ICD-10-CM | POA: Diagnosis not present

## 2015-03-15 DIAGNOSIS — E611 Iron deficiency: Secondary | ICD-10-CM | POA: Diagnosis not present

## 2015-03-15 DIAGNOSIS — N2581 Secondary hyperparathyroidism of renal origin: Secondary | ICD-10-CM | POA: Diagnosis not present

## 2015-03-15 DIAGNOSIS — D509 Iron deficiency anemia, unspecified: Secondary | ICD-10-CM | POA: Diagnosis not present

## 2015-03-18 DIAGNOSIS — D509 Iron deficiency anemia, unspecified: Secondary | ICD-10-CM | POA: Diagnosis not present

## 2015-03-18 DIAGNOSIS — D631 Anemia in chronic kidney disease: Secondary | ICD-10-CM | POA: Diagnosis not present

## 2015-03-18 DIAGNOSIS — N2581 Secondary hyperparathyroidism of renal origin: Secondary | ICD-10-CM | POA: Diagnosis not present

## 2015-03-18 DIAGNOSIS — Z992 Dependence on renal dialysis: Secondary | ICD-10-CM | POA: Diagnosis not present

## 2015-03-18 DIAGNOSIS — E611 Iron deficiency: Secondary | ICD-10-CM | POA: Diagnosis not present

## 2015-03-18 DIAGNOSIS — N186 End stage renal disease: Secondary | ICD-10-CM | POA: Diagnosis not present

## 2015-03-20 DIAGNOSIS — N186 End stage renal disease: Secondary | ICD-10-CM | POA: Diagnosis not present

## 2015-03-20 DIAGNOSIS — E611 Iron deficiency: Secondary | ICD-10-CM | POA: Diagnosis not present

## 2015-03-20 DIAGNOSIS — D509 Iron deficiency anemia, unspecified: Secondary | ICD-10-CM | POA: Diagnosis not present

## 2015-03-20 DIAGNOSIS — N2581 Secondary hyperparathyroidism of renal origin: Secondary | ICD-10-CM | POA: Diagnosis not present

## 2015-03-20 DIAGNOSIS — D631 Anemia in chronic kidney disease: Secondary | ICD-10-CM | POA: Diagnosis not present

## 2015-03-20 DIAGNOSIS — Z992 Dependence on renal dialysis: Secondary | ICD-10-CM | POA: Diagnosis not present

## 2015-03-22 DIAGNOSIS — D631 Anemia in chronic kidney disease: Secondary | ICD-10-CM | POA: Diagnosis not present

## 2015-03-22 DIAGNOSIS — N186 End stage renal disease: Secondary | ICD-10-CM | POA: Diagnosis not present

## 2015-03-22 DIAGNOSIS — Z992 Dependence on renal dialysis: Secondary | ICD-10-CM | POA: Diagnosis not present

## 2015-03-22 DIAGNOSIS — D509 Iron deficiency anemia, unspecified: Secondary | ICD-10-CM | POA: Diagnosis not present

## 2015-03-22 DIAGNOSIS — E611 Iron deficiency: Secondary | ICD-10-CM | POA: Diagnosis not present

## 2015-03-22 DIAGNOSIS — N2581 Secondary hyperparathyroidism of renal origin: Secondary | ICD-10-CM | POA: Diagnosis not present

## 2015-03-24 DIAGNOSIS — N186 End stage renal disease: Secondary | ICD-10-CM | POA: Diagnosis not present

## 2015-03-24 DIAGNOSIS — Z992 Dependence on renal dialysis: Secondary | ICD-10-CM | POA: Diagnosis not present

## 2015-03-25 DIAGNOSIS — N2581 Secondary hyperparathyroidism of renal origin: Secondary | ICD-10-CM | POA: Diagnosis not present

## 2015-03-25 DIAGNOSIS — D631 Anemia in chronic kidney disease: Secondary | ICD-10-CM | POA: Diagnosis not present

## 2015-03-25 DIAGNOSIS — N186 End stage renal disease: Secondary | ICD-10-CM | POA: Diagnosis not present

## 2015-03-25 DIAGNOSIS — Z992 Dependence on renal dialysis: Secondary | ICD-10-CM | POA: Diagnosis not present

## 2015-03-25 DIAGNOSIS — E611 Iron deficiency: Secondary | ICD-10-CM | POA: Diagnosis not present

## 2015-03-27 DIAGNOSIS — Z992 Dependence on renal dialysis: Secondary | ICD-10-CM | POA: Diagnosis not present

## 2015-03-27 DIAGNOSIS — D631 Anemia in chronic kidney disease: Secondary | ICD-10-CM | POA: Diagnosis not present

## 2015-03-27 DIAGNOSIS — N186 End stage renal disease: Secondary | ICD-10-CM | POA: Diagnosis not present

## 2015-03-27 DIAGNOSIS — E611 Iron deficiency: Secondary | ICD-10-CM | POA: Diagnosis not present

## 2015-03-27 DIAGNOSIS — N2581 Secondary hyperparathyroidism of renal origin: Secondary | ICD-10-CM | POA: Diagnosis not present

## 2015-03-29 DIAGNOSIS — D631 Anemia in chronic kidney disease: Secondary | ICD-10-CM | POA: Diagnosis not present

## 2015-03-29 DIAGNOSIS — N2581 Secondary hyperparathyroidism of renal origin: Secondary | ICD-10-CM | POA: Diagnosis not present

## 2015-03-29 DIAGNOSIS — N186 End stage renal disease: Secondary | ICD-10-CM | POA: Diagnosis not present

## 2015-03-29 DIAGNOSIS — Z992 Dependence on renal dialysis: Secondary | ICD-10-CM | POA: Diagnosis not present

## 2015-03-29 DIAGNOSIS — E611 Iron deficiency: Secondary | ICD-10-CM | POA: Diagnosis not present

## 2015-04-01 DIAGNOSIS — N186 End stage renal disease: Secondary | ICD-10-CM | POA: Diagnosis not present

## 2015-04-01 DIAGNOSIS — Z992 Dependence on renal dialysis: Secondary | ICD-10-CM | POA: Diagnosis not present

## 2015-04-01 DIAGNOSIS — N2581 Secondary hyperparathyroidism of renal origin: Secondary | ICD-10-CM | POA: Diagnosis not present

## 2015-04-01 DIAGNOSIS — D631 Anemia in chronic kidney disease: Secondary | ICD-10-CM | POA: Diagnosis not present

## 2015-04-01 DIAGNOSIS — E611 Iron deficiency: Secondary | ICD-10-CM | POA: Diagnosis not present

## 2015-04-03 DIAGNOSIS — N2581 Secondary hyperparathyroidism of renal origin: Secondary | ICD-10-CM | POA: Diagnosis not present

## 2015-04-03 DIAGNOSIS — N186 End stage renal disease: Secondary | ICD-10-CM | POA: Diagnosis not present

## 2015-04-03 DIAGNOSIS — E611 Iron deficiency: Secondary | ICD-10-CM | POA: Diagnosis not present

## 2015-04-03 DIAGNOSIS — D631 Anemia in chronic kidney disease: Secondary | ICD-10-CM | POA: Diagnosis not present

## 2015-04-03 DIAGNOSIS — Z992 Dependence on renal dialysis: Secondary | ICD-10-CM | POA: Diagnosis not present

## 2015-04-05 DIAGNOSIS — E611 Iron deficiency: Secondary | ICD-10-CM | POA: Diagnosis not present

## 2015-04-05 DIAGNOSIS — N2581 Secondary hyperparathyroidism of renal origin: Secondary | ICD-10-CM | POA: Diagnosis not present

## 2015-04-05 DIAGNOSIS — N186 End stage renal disease: Secondary | ICD-10-CM | POA: Diagnosis not present

## 2015-04-05 DIAGNOSIS — D631 Anemia in chronic kidney disease: Secondary | ICD-10-CM | POA: Diagnosis not present

## 2015-04-05 DIAGNOSIS — Z992 Dependence on renal dialysis: Secondary | ICD-10-CM | POA: Diagnosis not present

## 2015-04-08 DIAGNOSIS — E611 Iron deficiency: Secondary | ICD-10-CM | POA: Diagnosis not present

## 2015-04-08 DIAGNOSIS — N186 End stage renal disease: Secondary | ICD-10-CM | POA: Diagnosis not present

## 2015-04-08 DIAGNOSIS — D631 Anemia in chronic kidney disease: Secondary | ICD-10-CM | POA: Diagnosis not present

## 2015-04-08 DIAGNOSIS — Z992 Dependence on renal dialysis: Secondary | ICD-10-CM | POA: Diagnosis not present

## 2015-04-08 DIAGNOSIS — N2581 Secondary hyperparathyroidism of renal origin: Secondary | ICD-10-CM | POA: Diagnosis not present

## 2015-04-10 DIAGNOSIS — N186 End stage renal disease: Secondary | ICD-10-CM | POA: Diagnosis not present

## 2015-04-10 DIAGNOSIS — E611 Iron deficiency: Secondary | ICD-10-CM | POA: Diagnosis not present

## 2015-04-10 DIAGNOSIS — D631 Anemia in chronic kidney disease: Secondary | ICD-10-CM | POA: Diagnosis not present

## 2015-04-10 DIAGNOSIS — N2581 Secondary hyperparathyroidism of renal origin: Secondary | ICD-10-CM | POA: Diagnosis not present

## 2015-04-10 DIAGNOSIS — Z992 Dependence on renal dialysis: Secondary | ICD-10-CM | POA: Diagnosis not present

## 2015-04-12 DIAGNOSIS — D631 Anemia in chronic kidney disease: Secondary | ICD-10-CM | POA: Diagnosis not present

## 2015-04-12 DIAGNOSIS — N186 End stage renal disease: Secondary | ICD-10-CM | POA: Diagnosis not present

## 2015-04-12 DIAGNOSIS — Z992 Dependence on renal dialysis: Secondary | ICD-10-CM | POA: Diagnosis not present

## 2015-04-12 DIAGNOSIS — N2581 Secondary hyperparathyroidism of renal origin: Secondary | ICD-10-CM | POA: Diagnosis not present

## 2015-04-12 DIAGNOSIS — E611 Iron deficiency: Secondary | ICD-10-CM | POA: Diagnosis not present

## 2015-04-15 DIAGNOSIS — N186 End stage renal disease: Secondary | ICD-10-CM | POA: Diagnosis not present

## 2015-04-15 DIAGNOSIS — Z992 Dependence on renal dialysis: Secondary | ICD-10-CM | POA: Diagnosis not present

## 2015-04-15 DIAGNOSIS — E611 Iron deficiency: Secondary | ICD-10-CM | POA: Diagnosis not present

## 2015-04-15 DIAGNOSIS — D631 Anemia in chronic kidney disease: Secondary | ICD-10-CM | POA: Diagnosis not present

## 2015-04-15 DIAGNOSIS — N2581 Secondary hyperparathyroidism of renal origin: Secondary | ICD-10-CM | POA: Diagnosis not present

## 2015-04-17 DIAGNOSIS — N2581 Secondary hyperparathyroidism of renal origin: Secondary | ICD-10-CM | POA: Diagnosis not present

## 2015-04-17 DIAGNOSIS — D631 Anemia in chronic kidney disease: Secondary | ICD-10-CM | POA: Diagnosis not present

## 2015-04-17 DIAGNOSIS — N186 End stage renal disease: Secondary | ICD-10-CM | POA: Diagnosis not present

## 2015-04-17 DIAGNOSIS — E611 Iron deficiency: Secondary | ICD-10-CM | POA: Diagnosis not present

## 2015-04-17 DIAGNOSIS — Z992 Dependence on renal dialysis: Secondary | ICD-10-CM | POA: Diagnosis not present

## 2015-04-19 DIAGNOSIS — N186 End stage renal disease: Secondary | ICD-10-CM | POA: Diagnosis not present

## 2015-04-19 DIAGNOSIS — D631 Anemia in chronic kidney disease: Secondary | ICD-10-CM | POA: Diagnosis not present

## 2015-04-19 DIAGNOSIS — N2581 Secondary hyperparathyroidism of renal origin: Secondary | ICD-10-CM | POA: Diagnosis not present

## 2015-04-19 DIAGNOSIS — Z992 Dependence on renal dialysis: Secondary | ICD-10-CM | POA: Diagnosis not present

## 2015-04-19 DIAGNOSIS — E611 Iron deficiency: Secondary | ICD-10-CM | POA: Diagnosis not present

## 2015-04-22 DIAGNOSIS — N186 End stage renal disease: Secondary | ICD-10-CM | POA: Diagnosis not present

## 2015-04-22 DIAGNOSIS — E611 Iron deficiency: Secondary | ICD-10-CM | POA: Diagnosis not present

## 2015-04-22 DIAGNOSIS — Z992 Dependence on renal dialysis: Secondary | ICD-10-CM | POA: Diagnosis not present

## 2015-04-22 DIAGNOSIS — D631 Anemia in chronic kidney disease: Secondary | ICD-10-CM | POA: Diagnosis not present

## 2015-04-22 DIAGNOSIS — N2581 Secondary hyperparathyroidism of renal origin: Secondary | ICD-10-CM | POA: Diagnosis not present

## 2015-04-23 DIAGNOSIS — N186 End stage renal disease: Secondary | ICD-10-CM | POA: Diagnosis not present

## 2015-04-23 DIAGNOSIS — Z992 Dependence on renal dialysis: Secondary | ICD-10-CM | POA: Diagnosis not present

## 2015-04-24 DIAGNOSIS — N186 End stage renal disease: Secondary | ICD-10-CM | POA: Diagnosis not present

## 2015-04-24 DIAGNOSIS — D631 Anemia in chronic kidney disease: Secondary | ICD-10-CM | POA: Diagnosis not present

## 2015-04-24 DIAGNOSIS — E611 Iron deficiency: Secondary | ICD-10-CM | POA: Diagnosis not present

## 2015-04-24 DIAGNOSIS — N2581 Secondary hyperparathyroidism of renal origin: Secondary | ICD-10-CM | POA: Diagnosis not present

## 2015-04-24 DIAGNOSIS — Z992 Dependence on renal dialysis: Secondary | ICD-10-CM | POA: Diagnosis not present

## 2015-05-02 DIAGNOSIS — M545 Low back pain: Secondary | ICD-10-CM | POA: Diagnosis not present

## 2015-05-02 DIAGNOSIS — I10 Essential (primary) hypertension: Secondary | ICD-10-CM | POA: Diagnosis not present

## 2015-05-02 DIAGNOSIS — J449 Chronic obstructive pulmonary disease, unspecified: Secondary | ICD-10-CM | POA: Diagnosis not present

## 2015-05-02 DIAGNOSIS — N186 End stage renal disease: Secondary | ICD-10-CM | POA: Diagnosis not present

## 2015-05-24 DIAGNOSIS — Z992 Dependence on renal dialysis: Secondary | ICD-10-CM | POA: Diagnosis not present

## 2015-05-24 DIAGNOSIS — N186 End stage renal disease: Secondary | ICD-10-CM | POA: Diagnosis not present

## 2015-05-27 DIAGNOSIS — D631 Anemia in chronic kidney disease: Secondary | ICD-10-CM | POA: Diagnosis not present

## 2015-05-27 DIAGNOSIS — E611 Iron deficiency: Secondary | ICD-10-CM | POA: Diagnosis not present

## 2015-05-27 DIAGNOSIS — D509 Iron deficiency anemia, unspecified: Secondary | ICD-10-CM | POA: Diagnosis not present

## 2015-05-27 DIAGNOSIS — Z992 Dependence on renal dialysis: Secondary | ICD-10-CM | POA: Diagnosis not present

## 2015-05-27 DIAGNOSIS — N2581 Secondary hyperparathyroidism of renal origin: Secondary | ICD-10-CM | POA: Diagnosis not present

## 2015-05-27 DIAGNOSIS — N186 End stage renal disease: Secondary | ICD-10-CM | POA: Diagnosis not present

## 2015-05-29 DIAGNOSIS — N2581 Secondary hyperparathyroidism of renal origin: Secondary | ICD-10-CM | POA: Diagnosis not present

## 2015-05-29 DIAGNOSIS — D509 Iron deficiency anemia, unspecified: Secondary | ICD-10-CM | POA: Diagnosis not present

## 2015-05-29 DIAGNOSIS — E611 Iron deficiency: Secondary | ICD-10-CM | POA: Diagnosis not present

## 2015-05-29 DIAGNOSIS — D631 Anemia in chronic kidney disease: Secondary | ICD-10-CM | POA: Diagnosis not present

## 2015-05-29 DIAGNOSIS — N186 End stage renal disease: Secondary | ICD-10-CM | POA: Diagnosis not present

## 2015-05-29 DIAGNOSIS — Z992 Dependence on renal dialysis: Secondary | ICD-10-CM | POA: Diagnosis not present

## 2015-05-31 DIAGNOSIS — D509 Iron deficiency anemia, unspecified: Secondary | ICD-10-CM | POA: Diagnosis not present

## 2015-05-31 DIAGNOSIS — N2581 Secondary hyperparathyroidism of renal origin: Secondary | ICD-10-CM | POA: Diagnosis not present

## 2015-05-31 DIAGNOSIS — E611 Iron deficiency: Secondary | ICD-10-CM | POA: Diagnosis not present

## 2015-05-31 DIAGNOSIS — Z992 Dependence on renal dialysis: Secondary | ICD-10-CM | POA: Diagnosis not present

## 2015-05-31 DIAGNOSIS — D631 Anemia in chronic kidney disease: Secondary | ICD-10-CM | POA: Diagnosis not present

## 2015-05-31 DIAGNOSIS — N186 End stage renal disease: Secondary | ICD-10-CM | POA: Diagnosis not present

## 2015-06-03 DIAGNOSIS — D509 Iron deficiency anemia, unspecified: Secondary | ICD-10-CM | POA: Diagnosis not present

## 2015-06-03 DIAGNOSIS — N186 End stage renal disease: Secondary | ICD-10-CM | POA: Diagnosis not present

## 2015-06-03 DIAGNOSIS — Z992 Dependence on renal dialysis: Secondary | ICD-10-CM | POA: Diagnosis not present

## 2015-06-03 DIAGNOSIS — D631 Anemia in chronic kidney disease: Secondary | ICD-10-CM | POA: Diagnosis not present

## 2015-06-03 DIAGNOSIS — E611 Iron deficiency: Secondary | ICD-10-CM | POA: Diagnosis not present

## 2015-06-03 DIAGNOSIS — N2581 Secondary hyperparathyroidism of renal origin: Secondary | ICD-10-CM | POA: Diagnosis not present

## 2015-06-05 DIAGNOSIS — Z992 Dependence on renal dialysis: Secondary | ICD-10-CM | POA: Diagnosis not present

## 2015-06-05 DIAGNOSIS — D631 Anemia in chronic kidney disease: Secondary | ICD-10-CM | POA: Diagnosis not present

## 2015-06-05 DIAGNOSIS — N186 End stage renal disease: Secondary | ICD-10-CM | POA: Diagnosis not present

## 2015-06-05 DIAGNOSIS — N2581 Secondary hyperparathyroidism of renal origin: Secondary | ICD-10-CM | POA: Diagnosis not present

## 2015-06-05 DIAGNOSIS — D509 Iron deficiency anemia, unspecified: Secondary | ICD-10-CM | POA: Diagnosis not present

## 2015-06-05 DIAGNOSIS — E611 Iron deficiency: Secondary | ICD-10-CM | POA: Diagnosis not present

## 2015-06-07 DIAGNOSIS — N2581 Secondary hyperparathyroidism of renal origin: Secondary | ICD-10-CM | POA: Diagnosis not present

## 2015-06-07 DIAGNOSIS — E611 Iron deficiency: Secondary | ICD-10-CM | POA: Diagnosis not present

## 2015-06-07 DIAGNOSIS — N186 End stage renal disease: Secondary | ICD-10-CM | POA: Diagnosis not present

## 2015-06-07 DIAGNOSIS — D631 Anemia in chronic kidney disease: Secondary | ICD-10-CM | POA: Diagnosis not present

## 2015-06-07 DIAGNOSIS — Z992 Dependence on renal dialysis: Secondary | ICD-10-CM | POA: Diagnosis not present

## 2015-06-07 DIAGNOSIS — D509 Iron deficiency anemia, unspecified: Secondary | ICD-10-CM | POA: Diagnosis not present

## 2015-06-10 DIAGNOSIS — N2581 Secondary hyperparathyroidism of renal origin: Secondary | ICD-10-CM | POA: Diagnosis not present

## 2015-06-10 DIAGNOSIS — Z992 Dependence on renal dialysis: Secondary | ICD-10-CM | POA: Diagnosis not present

## 2015-06-10 DIAGNOSIS — E611 Iron deficiency: Secondary | ICD-10-CM | POA: Diagnosis not present

## 2015-06-10 DIAGNOSIS — D509 Iron deficiency anemia, unspecified: Secondary | ICD-10-CM | POA: Diagnosis not present

## 2015-06-10 DIAGNOSIS — D631 Anemia in chronic kidney disease: Secondary | ICD-10-CM | POA: Diagnosis not present

## 2015-06-10 DIAGNOSIS — N186 End stage renal disease: Secondary | ICD-10-CM | POA: Diagnosis not present

## 2015-06-12 DIAGNOSIS — D509 Iron deficiency anemia, unspecified: Secondary | ICD-10-CM | POA: Diagnosis not present

## 2015-06-12 DIAGNOSIS — E611 Iron deficiency: Secondary | ICD-10-CM | POA: Diagnosis not present

## 2015-06-12 DIAGNOSIS — N2581 Secondary hyperparathyroidism of renal origin: Secondary | ICD-10-CM | POA: Diagnosis not present

## 2015-06-12 DIAGNOSIS — N186 End stage renal disease: Secondary | ICD-10-CM | POA: Diagnosis not present

## 2015-06-12 DIAGNOSIS — Z992 Dependence on renal dialysis: Secondary | ICD-10-CM | POA: Diagnosis not present

## 2015-06-12 DIAGNOSIS — D631 Anemia in chronic kidney disease: Secondary | ICD-10-CM | POA: Diagnosis not present

## 2015-06-14 DIAGNOSIS — N186 End stage renal disease: Secondary | ICD-10-CM | POA: Diagnosis not present

## 2015-06-14 DIAGNOSIS — D509 Iron deficiency anemia, unspecified: Secondary | ICD-10-CM | POA: Diagnosis not present

## 2015-06-14 DIAGNOSIS — N2581 Secondary hyperparathyroidism of renal origin: Secondary | ICD-10-CM | POA: Diagnosis not present

## 2015-06-14 DIAGNOSIS — E611 Iron deficiency: Secondary | ICD-10-CM | POA: Diagnosis not present

## 2015-06-14 DIAGNOSIS — D631 Anemia in chronic kidney disease: Secondary | ICD-10-CM | POA: Diagnosis not present

## 2015-06-14 DIAGNOSIS — Z992 Dependence on renal dialysis: Secondary | ICD-10-CM | POA: Diagnosis not present

## 2015-06-17 DIAGNOSIS — E611 Iron deficiency: Secondary | ICD-10-CM | POA: Diagnosis not present

## 2015-06-17 DIAGNOSIS — N2581 Secondary hyperparathyroidism of renal origin: Secondary | ICD-10-CM | POA: Diagnosis not present

## 2015-06-17 DIAGNOSIS — D631 Anemia in chronic kidney disease: Secondary | ICD-10-CM | POA: Diagnosis not present

## 2015-06-17 DIAGNOSIS — N186 End stage renal disease: Secondary | ICD-10-CM | POA: Diagnosis not present

## 2015-06-17 DIAGNOSIS — D509 Iron deficiency anemia, unspecified: Secondary | ICD-10-CM | POA: Diagnosis not present

## 2015-06-17 DIAGNOSIS — Z992 Dependence on renal dialysis: Secondary | ICD-10-CM | POA: Diagnosis not present

## 2015-06-19 DIAGNOSIS — N186 End stage renal disease: Secondary | ICD-10-CM | POA: Diagnosis not present

## 2015-06-19 DIAGNOSIS — E611 Iron deficiency: Secondary | ICD-10-CM | POA: Diagnosis not present

## 2015-06-19 DIAGNOSIS — D509 Iron deficiency anemia, unspecified: Secondary | ICD-10-CM | POA: Diagnosis not present

## 2015-06-19 DIAGNOSIS — Z992 Dependence on renal dialysis: Secondary | ICD-10-CM | POA: Diagnosis not present

## 2015-06-19 DIAGNOSIS — D631 Anemia in chronic kidney disease: Secondary | ICD-10-CM | POA: Diagnosis not present

## 2015-06-19 DIAGNOSIS — N2581 Secondary hyperparathyroidism of renal origin: Secondary | ICD-10-CM | POA: Diagnosis not present

## 2015-06-21 DIAGNOSIS — Z992 Dependence on renal dialysis: Secondary | ICD-10-CM | POA: Diagnosis not present

## 2015-06-21 DIAGNOSIS — N186 End stage renal disease: Secondary | ICD-10-CM | POA: Diagnosis not present

## 2015-06-21 DIAGNOSIS — D631 Anemia in chronic kidney disease: Secondary | ICD-10-CM | POA: Diagnosis not present

## 2015-06-21 DIAGNOSIS — E611 Iron deficiency: Secondary | ICD-10-CM | POA: Diagnosis not present

## 2015-06-21 DIAGNOSIS — N2581 Secondary hyperparathyroidism of renal origin: Secondary | ICD-10-CM | POA: Diagnosis not present

## 2015-06-21 DIAGNOSIS — D509 Iron deficiency anemia, unspecified: Secondary | ICD-10-CM | POA: Diagnosis not present

## 2015-06-24 DIAGNOSIS — Z992 Dependence on renal dialysis: Secondary | ICD-10-CM | POA: Diagnosis not present

## 2015-06-24 DIAGNOSIS — E611 Iron deficiency: Secondary | ICD-10-CM | POA: Diagnosis not present

## 2015-06-24 DIAGNOSIS — N2581 Secondary hyperparathyroidism of renal origin: Secondary | ICD-10-CM | POA: Diagnosis not present

## 2015-06-24 DIAGNOSIS — D631 Anemia in chronic kidney disease: Secondary | ICD-10-CM | POA: Diagnosis not present

## 2015-06-24 DIAGNOSIS — D509 Iron deficiency anemia, unspecified: Secondary | ICD-10-CM | POA: Diagnosis not present

## 2015-06-24 DIAGNOSIS — N186 End stage renal disease: Secondary | ICD-10-CM | POA: Diagnosis not present

## 2015-06-26 DIAGNOSIS — E611 Iron deficiency: Secondary | ICD-10-CM | POA: Diagnosis not present

## 2015-06-26 DIAGNOSIS — N2581 Secondary hyperparathyroidism of renal origin: Secondary | ICD-10-CM | POA: Diagnosis not present

## 2015-06-26 DIAGNOSIS — N186 End stage renal disease: Secondary | ICD-10-CM | POA: Diagnosis not present

## 2015-06-26 DIAGNOSIS — Z992 Dependence on renal dialysis: Secondary | ICD-10-CM | POA: Diagnosis not present

## 2015-06-26 DIAGNOSIS — D631 Anemia in chronic kidney disease: Secondary | ICD-10-CM | POA: Diagnosis not present

## 2015-06-28 DIAGNOSIS — N2581 Secondary hyperparathyroidism of renal origin: Secondary | ICD-10-CM | POA: Diagnosis not present

## 2015-06-28 DIAGNOSIS — Z992 Dependence on renal dialysis: Secondary | ICD-10-CM | POA: Diagnosis not present

## 2015-06-28 DIAGNOSIS — E611 Iron deficiency: Secondary | ICD-10-CM | POA: Diagnosis not present

## 2015-06-28 DIAGNOSIS — D631 Anemia in chronic kidney disease: Secondary | ICD-10-CM | POA: Diagnosis not present

## 2015-06-28 DIAGNOSIS — N186 End stage renal disease: Secondary | ICD-10-CM | POA: Diagnosis not present

## 2015-07-01 DIAGNOSIS — N186 End stage renal disease: Secondary | ICD-10-CM | POA: Diagnosis not present

## 2015-07-01 DIAGNOSIS — Z992 Dependence on renal dialysis: Secondary | ICD-10-CM | POA: Diagnosis not present

## 2015-07-01 DIAGNOSIS — E611 Iron deficiency: Secondary | ICD-10-CM | POA: Diagnosis not present

## 2015-07-01 DIAGNOSIS — N2581 Secondary hyperparathyroidism of renal origin: Secondary | ICD-10-CM | POA: Diagnosis not present

## 2015-07-01 DIAGNOSIS — D631 Anemia in chronic kidney disease: Secondary | ICD-10-CM | POA: Diagnosis not present

## 2015-07-03 DIAGNOSIS — N186 End stage renal disease: Secondary | ICD-10-CM | POA: Diagnosis not present

## 2015-07-03 DIAGNOSIS — N2581 Secondary hyperparathyroidism of renal origin: Secondary | ICD-10-CM | POA: Diagnosis not present

## 2015-07-03 DIAGNOSIS — Z992 Dependence on renal dialysis: Secondary | ICD-10-CM | POA: Diagnosis not present

## 2015-07-03 DIAGNOSIS — D631 Anemia in chronic kidney disease: Secondary | ICD-10-CM | POA: Diagnosis not present

## 2015-07-03 DIAGNOSIS — E611 Iron deficiency: Secondary | ICD-10-CM | POA: Diagnosis not present

## 2015-07-05 DIAGNOSIS — Z992 Dependence on renal dialysis: Secondary | ICD-10-CM | POA: Diagnosis not present

## 2015-07-05 DIAGNOSIS — N186 End stage renal disease: Secondary | ICD-10-CM | POA: Diagnosis not present

## 2015-07-05 DIAGNOSIS — E611 Iron deficiency: Secondary | ICD-10-CM | POA: Diagnosis not present

## 2015-07-05 DIAGNOSIS — D631 Anemia in chronic kidney disease: Secondary | ICD-10-CM | POA: Diagnosis not present

## 2015-07-05 DIAGNOSIS — N2581 Secondary hyperparathyroidism of renal origin: Secondary | ICD-10-CM | POA: Diagnosis not present

## 2015-07-08 DIAGNOSIS — N186 End stage renal disease: Secondary | ICD-10-CM | POA: Diagnosis not present

## 2015-07-08 DIAGNOSIS — Z992 Dependence on renal dialysis: Secondary | ICD-10-CM | POA: Diagnosis not present

## 2015-07-08 DIAGNOSIS — N2581 Secondary hyperparathyroidism of renal origin: Secondary | ICD-10-CM | POA: Diagnosis not present

## 2015-07-08 DIAGNOSIS — D631 Anemia in chronic kidney disease: Secondary | ICD-10-CM | POA: Diagnosis not present

## 2015-07-08 DIAGNOSIS — E611 Iron deficiency: Secondary | ICD-10-CM | POA: Diagnosis not present

## 2015-07-10 DIAGNOSIS — D631 Anemia in chronic kidney disease: Secondary | ICD-10-CM | POA: Diagnosis not present

## 2015-07-10 DIAGNOSIS — N2581 Secondary hyperparathyroidism of renal origin: Secondary | ICD-10-CM | POA: Diagnosis not present

## 2015-07-10 DIAGNOSIS — E611 Iron deficiency: Secondary | ICD-10-CM | POA: Diagnosis not present

## 2015-07-10 DIAGNOSIS — Z992 Dependence on renal dialysis: Secondary | ICD-10-CM | POA: Diagnosis not present

## 2015-07-10 DIAGNOSIS — N186 End stage renal disease: Secondary | ICD-10-CM | POA: Diagnosis not present

## 2015-07-12 DIAGNOSIS — Z992 Dependence on renal dialysis: Secondary | ICD-10-CM | POA: Diagnosis not present

## 2015-07-12 DIAGNOSIS — E611 Iron deficiency: Secondary | ICD-10-CM | POA: Diagnosis not present

## 2015-07-12 DIAGNOSIS — N186 End stage renal disease: Secondary | ICD-10-CM | POA: Diagnosis not present

## 2015-07-12 DIAGNOSIS — N2581 Secondary hyperparathyroidism of renal origin: Secondary | ICD-10-CM | POA: Diagnosis not present

## 2015-07-12 DIAGNOSIS — D631 Anemia in chronic kidney disease: Secondary | ICD-10-CM | POA: Diagnosis not present

## 2015-07-15 DIAGNOSIS — N186 End stage renal disease: Secondary | ICD-10-CM | POA: Diagnosis not present

## 2015-07-15 DIAGNOSIS — Z992 Dependence on renal dialysis: Secondary | ICD-10-CM | POA: Diagnosis not present

## 2015-07-15 DIAGNOSIS — N2581 Secondary hyperparathyroidism of renal origin: Secondary | ICD-10-CM | POA: Diagnosis not present

## 2015-07-15 DIAGNOSIS — D631 Anemia in chronic kidney disease: Secondary | ICD-10-CM | POA: Diagnosis not present

## 2015-07-15 DIAGNOSIS — E611 Iron deficiency: Secondary | ICD-10-CM | POA: Diagnosis not present

## 2015-07-17 DIAGNOSIS — N2581 Secondary hyperparathyroidism of renal origin: Secondary | ICD-10-CM | POA: Diagnosis not present

## 2015-07-17 DIAGNOSIS — N186 End stage renal disease: Secondary | ICD-10-CM | POA: Diagnosis not present

## 2015-07-17 DIAGNOSIS — Z992 Dependence on renal dialysis: Secondary | ICD-10-CM | POA: Diagnosis not present

## 2015-07-17 DIAGNOSIS — E611 Iron deficiency: Secondary | ICD-10-CM | POA: Diagnosis not present

## 2015-07-17 DIAGNOSIS — D631 Anemia in chronic kidney disease: Secondary | ICD-10-CM | POA: Diagnosis not present

## 2015-07-19 DIAGNOSIS — Z992 Dependence on renal dialysis: Secondary | ICD-10-CM | POA: Diagnosis not present

## 2015-07-19 DIAGNOSIS — D631 Anemia in chronic kidney disease: Secondary | ICD-10-CM | POA: Diagnosis not present

## 2015-07-19 DIAGNOSIS — E611 Iron deficiency: Secondary | ICD-10-CM | POA: Diagnosis not present

## 2015-07-19 DIAGNOSIS — N186 End stage renal disease: Secondary | ICD-10-CM | POA: Diagnosis not present

## 2015-07-19 DIAGNOSIS — N2581 Secondary hyperparathyroidism of renal origin: Secondary | ICD-10-CM | POA: Diagnosis not present

## 2015-07-22 DIAGNOSIS — N186 End stage renal disease: Secondary | ICD-10-CM | POA: Diagnosis not present

## 2015-07-22 DIAGNOSIS — N2581 Secondary hyperparathyroidism of renal origin: Secondary | ICD-10-CM | POA: Diagnosis not present

## 2015-07-22 DIAGNOSIS — Z992 Dependence on renal dialysis: Secondary | ICD-10-CM | POA: Diagnosis not present

## 2015-07-22 DIAGNOSIS — E611 Iron deficiency: Secondary | ICD-10-CM | POA: Diagnosis not present

## 2015-07-22 DIAGNOSIS — D631 Anemia in chronic kidney disease: Secondary | ICD-10-CM | POA: Diagnosis not present

## 2015-07-24 DIAGNOSIS — E611 Iron deficiency: Secondary | ICD-10-CM | POA: Diagnosis not present

## 2015-07-24 DIAGNOSIS — N2581 Secondary hyperparathyroidism of renal origin: Secondary | ICD-10-CM | POA: Diagnosis not present

## 2015-07-24 DIAGNOSIS — Z992 Dependence on renal dialysis: Secondary | ICD-10-CM | POA: Diagnosis not present

## 2015-07-24 DIAGNOSIS — N186 End stage renal disease: Secondary | ICD-10-CM | POA: Diagnosis not present

## 2015-07-24 DIAGNOSIS — D631 Anemia in chronic kidney disease: Secondary | ICD-10-CM | POA: Diagnosis not present

## 2015-07-26 DIAGNOSIS — N2581 Secondary hyperparathyroidism of renal origin: Secondary | ICD-10-CM | POA: Diagnosis not present

## 2015-07-26 DIAGNOSIS — Z992 Dependence on renal dialysis: Secondary | ICD-10-CM | POA: Diagnosis not present

## 2015-07-26 DIAGNOSIS — N186 End stage renal disease: Secondary | ICD-10-CM | POA: Diagnosis not present

## 2015-07-26 DIAGNOSIS — E611 Iron deficiency: Secondary | ICD-10-CM | POA: Diagnosis not present

## 2015-07-26 DIAGNOSIS — D631 Anemia in chronic kidney disease: Secondary | ICD-10-CM | POA: Diagnosis not present

## 2015-07-29 DIAGNOSIS — N186 End stage renal disease: Secondary | ICD-10-CM | POA: Diagnosis not present

## 2015-07-29 DIAGNOSIS — N2581 Secondary hyperparathyroidism of renal origin: Secondary | ICD-10-CM | POA: Diagnosis not present

## 2015-07-29 DIAGNOSIS — Z992 Dependence on renal dialysis: Secondary | ICD-10-CM | POA: Diagnosis not present

## 2015-07-29 DIAGNOSIS — E611 Iron deficiency: Secondary | ICD-10-CM | POA: Diagnosis not present

## 2015-07-29 DIAGNOSIS — D631 Anemia in chronic kidney disease: Secondary | ICD-10-CM | POA: Diagnosis not present

## 2015-07-31 DIAGNOSIS — N186 End stage renal disease: Secondary | ICD-10-CM | POA: Diagnosis not present

## 2015-07-31 DIAGNOSIS — Z992 Dependence on renal dialysis: Secondary | ICD-10-CM | POA: Diagnosis not present

## 2015-07-31 DIAGNOSIS — D631 Anemia in chronic kidney disease: Secondary | ICD-10-CM | POA: Diagnosis not present

## 2015-07-31 DIAGNOSIS — N2581 Secondary hyperparathyroidism of renal origin: Secondary | ICD-10-CM | POA: Diagnosis not present

## 2015-07-31 DIAGNOSIS — E611 Iron deficiency: Secondary | ICD-10-CM | POA: Diagnosis not present

## 2015-08-02 DIAGNOSIS — N2581 Secondary hyperparathyroidism of renal origin: Secondary | ICD-10-CM | POA: Diagnosis not present

## 2015-08-02 DIAGNOSIS — Z992 Dependence on renal dialysis: Secondary | ICD-10-CM | POA: Diagnosis not present

## 2015-08-02 DIAGNOSIS — D631 Anemia in chronic kidney disease: Secondary | ICD-10-CM | POA: Diagnosis not present

## 2015-08-02 DIAGNOSIS — E611 Iron deficiency: Secondary | ICD-10-CM | POA: Diagnosis not present

## 2015-08-02 DIAGNOSIS — N186 End stage renal disease: Secondary | ICD-10-CM | POA: Diagnosis not present

## 2015-08-05 DIAGNOSIS — Z992 Dependence on renal dialysis: Secondary | ICD-10-CM | POA: Diagnosis not present

## 2015-08-05 DIAGNOSIS — E611 Iron deficiency: Secondary | ICD-10-CM | POA: Diagnosis not present

## 2015-08-05 DIAGNOSIS — D631 Anemia in chronic kidney disease: Secondary | ICD-10-CM | POA: Diagnosis not present

## 2015-08-05 DIAGNOSIS — N2581 Secondary hyperparathyroidism of renal origin: Secondary | ICD-10-CM | POA: Diagnosis not present

## 2015-08-05 DIAGNOSIS — N186 End stage renal disease: Secondary | ICD-10-CM | POA: Diagnosis not present

## 2015-08-07 DIAGNOSIS — Z992 Dependence on renal dialysis: Secondary | ICD-10-CM | POA: Diagnosis not present

## 2015-08-07 DIAGNOSIS — E611 Iron deficiency: Secondary | ICD-10-CM | POA: Diagnosis not present

## 2015-08-07 DIAGNOSIS — N2581 Secondary hyperparathyroidism of renal origin: Secondary | ICD-10-CM | POA: Diagnosis not present

## 2015-08-07 DIAGNOSIS — N186 End stage renal disease: Secondary | ICD-10-CM | POA: Diagnosis not present

## 2015-08-07 DIAGNOSIS — D631 Anemia in chronic kidney disease: Secondary | ICD-10-CM | POA: Diagnosis not present

## 2015-08-09 DIAGNOSIS — Z992 Dependence on renal dialysis: Secondary | ICD-10-CM | POA: Diagnosis not present

## 2015-08-09 DIAGNOSIS — N2581 Secondary hyperparathyroidism of renal origin: Secondary | ICD-10-CM | POA: Diagnosis not present

## 2015-08-09 DIAGNOSIS — N186 End stage renal disease: Secondary | ICD-10-CM | POA: Diagnosis not present

## 2015-08-09 DIAGNOSIS — E611 Iron deficiency: Secondary | ICD-10-CM | POA: Diagnosis not present

## 2015-08-09 DIAGNOSIS — D631 Anemia in chronic kidney disease: Secondary | ICD-10-CM | POA: Diagnosis not present

## 2015-08-12 DIAGNOSIS — N186 End stage renal disease: Secondary | ICD-10-CM | POA: Diagnosis not present

## 2015-08-12 DIAGNOSIS — D631 Anemia in chronic kidney disease: Secondary | ICD-10-CM | POA: Diagnosis not present

## 2015-08-12 DIAGNOSIS — E611 Iron deficiency: Secondary | ICD-10-CM | POA: Diagnosis not present

## 2015-08-12 DIAGNOSIS — N2581 Secondary hyperparathyroidism of renal origin: Secondary | ICD-10-CM | POA: Diagnosis not present

## 2015-08-12 DIAGNOSIS — Z992 Dependence on renal dialysis: Secondary | ICD-10-CM | POA: Diagnosis not present

## 2015-08-14 DIAGNOSIS — E611 Iron deficiency: Secondary | ICD-10-CM | POA: Diagnosis not present

## 2015-08-14 DIAGNOSIS — Z992 Dependence on renal dialysis: Secondary | ICD-10-CM | POA: Diagnosis not present

## 2015-08-14 DIAGNOSIS — N2581 Secondary hyperparathyroidism of renal origin: Secondary | ICD-10-CM | POA: Diagnosis not present

## 2015-08-14 DIAGNOSIS — N186 End stage renal disease: Secondary | ICD-10-CM | POA: Diagnosis not present

## 2015-08-14 DIAGNOSIS — D631 Anemia in chronic kidney disease: Secondary | ICD-10-CM | POA: Diagnosis not present

## 2015-08-16 DIAGNOSIS — N2581 Secondary hyperparathyroidism of renal origin: Secondary | ICD-10-CM | POA: Diagnosis not present

## 2015-08-16 DIAGNOSIS — N186 End stage renal disease: Secondary | ICD-10-CM | POA: Diagnosis not present

## 2015-08-16 DIAGNOSIS — D631 Anemia in chronic kidney disease: Secondary | ICD-10-CM | POA: Diagnosis not present

## 2015-08-16 DIAGNOSIS — E611 Iron deficiency: Secondary | ICD-10-CM | POA: Diagnosis not present

## 2015-08-16 DIAGNOSIS — Z992 Dependence on renal dialysis: Secondary | ICD-10-CM | POA: Diagnosis not present

## 2015-08-19 DIAGNOSIS — D631 Anemia in chronic kidney disease: Secondary | ICD-10-CM | POA: Diagnosis not present

## 2015-08-19 DIAGNOSIS — Z992 Dependence on renal dialysis: Secondary | ICD-10-CM | POA: Diagnosis not present

## 2015-08-19 DIAGNOSIS — N2581 Secondary hyperparathyroidism of renal origin: Secondary | ICD-10-CM | POA: Diagnosis not present

## 2015-08-19 DIAGNOSIS — N186 End stage renal disease: Secondary | ICD-10-CM | POA: Diagnosis not present

## 2015-08-19 DIAGNOSIS — E611 Iron deficiency: Secondary | ICD-10-CM | POA: Diagnosis not present

## 2015-08-21 DIAGNOSIS — N2581 Secondary hyperparathyroidism of renal origin: Secondary | ICD-10-CM | POA: Diagnosis not present

## 2015-08-21 DIAGNOSIS — Z992 Dependence on renal dialysis: Secondary | ICD-10-CM | POA: Diagnosis not present

## 2015-08-21 DIAGNOSIS — E611 Iron deficiency: Secondary | ICD-10-CM | POA: Diagnosis not present

## 2015-08-21 DIAGNOSIS — D631 Anemia in chronic kidney disease: Secondary | ICD-10-CM | POA: Diagnosis not present

## 2015-08-21 DIAGNOSIS — N186 End stage renal disease: Secondary | ICD-10-CM | POA: Diagnosis not present

## 2015-08-22 DIAGNOSIS — N186 End stage renal disease: Secondary | ICD-10-CM | POA: Diagnosis not present

## 2015-08-22 DIAGNOSIS — Z992 Dependence on renal dialysis: Secondary | ICD-10-CM | POA: Diagnosis not present

## 2015-08-23 DIAGNOSIS — N2581 Secondary hyperparathyroidism of renal origin: Secondary | ICD-10-CM | POA: Diagnosis not present

## 2015-08-23 DIAGNOSIS — Z992 Dependence on renal dialysis: Secondary | ICD-10-CM | POA: Diagnosis not present

## 2015-08-23 DIAGNOSIS — E611 Iron deficiency: Secondary | ICD-10-CM | POA: Diagnosis not present

## 2015-08-23 DIAGNOSIS — L299 Pruritus, unspecified: Secondary | ICD-10-CM | POA: Diagnosis not present

## 2015-08-23 DIAGNOSIS — N186 End stage renal disease: Secondary | ICD-10-CM | POA: Diagnosis not present

## 2015-08-23 DIAGNOSIS — D631 Anemia in chronic kidney disease: Secondary | ICD-10-CM | POA: Diagnosis not present

## 2015-08-26 DIAGNOSIS — N186 End stage renal disease: Secondary | ICD-10-CM | POA: Diagnosis not present

## 2015-08-26 DIAGNOSIS — E611 Iron deficiency: Secondary | ICD-10-CM | POA: Diagnosis not present

## 2015-08-26 DIAGNOSIS — D631 Anemia in chronic kidney disease: Secondary | ICD-10-CM | POA: Diagnosis not present

## 2015-08-26 DIAGNOSIS — L299 Pruritus, unspecified: Secondary | ICD-10-CM | POA: Diagnosis not present

## 2015-08-26 DIAGNOSIS — N2581 Secondary hyperparathyroidism of renal origin: Secondary | ICD-10-CM | POA: Diagnosis not present

## 2015-08-26 DIAGNOSIS — Z992 Dependence on renal dialysis: Secondary | ICD-10-CM | POA: Diagnosis not present

## 2015-08-28 DIAGNOSIS — N186 End stage renal disease: Secondary | ICD-10-CM | POA: Diagnosis not present

## 2015-08-28 DIAGNOSIS — Z992 Dependence on renal dialysis: Secondary | ICD-10-CM | POA: Diagnosis not present

## 2015-08-28 DIAGNOSIS — D631 Anemia in chronic kidney disease: Secondary | ICD-10-CM | POA: Diagnosis not present

## 2015-08-28 DIAGNOSIS — E611 Iron deficiency: Secondary | ICD-10-CM | POA: Diagnosis not present

## 2015-08-28 DIAGNOSIS — N2581 Secondary hyperparathyroidism of renal origin: Secondary | ICD-10-CM | POA: Diagnosis not present

## 2015-08-28 DIAGNOSIS — L299 Pruritus, unspecified: Secondary | ICD-10-CM | POA: Diagnosis not present

## 2015-08-29 DIAGNOSIS — I10 Essential (primary) hypertension: Secondary | ICD-10-CM | POA: Diagnosis not present

## 2015-08-29 DIAGNOSIS — J449 Chronic obstructive pulmonary disease, unspecified: Secondary | ICD-10-CM | POA: Diagnosis not present

## 2015-08-29 DIAGNOSIS — N186 End stage renal disease: Secondary | ICD-10-CM | POA: Diagnosis not present

## 2015-08-29 DIAGNOSIS — M545 Low back pain: Secondary | ICD-10-CM | POA: Diagnosis not present

## 2015-08-30 DIAGNOSIS — D631 Anemia in chronic kidney disease: Secondary | ICD-10-CM | POA: Diagnosis not present

## 2015-08-30 DIAGNOSIS — Z992 Dependence on renal dialysis: Secondary | ICD-10-CM | POA: Diagnosis not present

## 2015-08-30 DIAGNOSIS — N2581 Secondary hyperparathyroidism of renal origin: Secondary | ICD-10-CM | POA: Diagnosis not present

## 2015-08-30 DIAGNOSIS — E611 Iron deficiency: Secondary | ICD-10-CM | POA: Diagnosis not present

## 2015-08-30 DIAGNOSIS — L299 Pruritus, unspecified: Secondary | ICD-10-CM | POA: Diagnosis not present

## 2015-08-30 DIAGNOSIS — N186 End stage renal disease: Secondary | ICD-10-CM | POA: Diagnosis not present

## 2015-09-02 DIAGNOSIS — L299 Pruritus, unspecified: Secondary | ICD-10-CM | POA: Diagnosis not present

## 2015-09-02 DIAGNOSIS — E611 Iron deficiency: Secondary | ICD-10-CM | POA: Diagnosis not present

## 2015-09-02 DIAGNOSIS — N2581 Secondary hyperparathyroidism of renal origin: Secondary | ICD-10-CM | POA: Diagnosis not present

## 2015-09-02 DIAGNOSIS — N186 End stage renal disease: Secondary | ICD-10-CM | POA: Diagnosis not present

## 2015-09-02 DIAGNOSIS — Z992 Dependence on renal dialysis: Secondary | ICD-10-CM | POA: Diagnosis not present

## 2015-09-02 DIAGNOSIS — D631 Anemia in chronic kidney disease: Secondary | ICD-10-CM | POA: Diagnosis not present

## 2015-09-04 DIAGNOSIS — D631 Anemia in chronic kidney disease: Secondary | ICD-10-CM | POA: Diagnosis not present

## 2015-09-04 DIAGNOSIS — E611 Iron deficiency: Secondary | ICD-10-CM | POA: Diagnosis not present

## 2015-09-04 DIAGNOSIS — Z992 Dependence on renal dialysis: Secondary | ICD-10-CM | POA: Diagnosis not present

## 2015-09-04 DIAGNOSIS — L299 Pruritus, unspecified: Secondary | ICD-10-CM | POA: Diagnosis not present

## 2015-09-04 DIAGNOSIS — N186 End stage renal disease: Secondary | ICD-10-CM | POA: Diagnosis not present

## 2015-09-04 DIAGNOSIS — N2581 Secondary hyperparathyroidism of renal origin: Secondary | ICD-10-CM | POA: Diagnosis not present

## 2015-09-06 DIAGNOSIS — N2581 Secondary hyperparathyroidism of renal origin: Secondary | ICD-10-CM | POA: Diagnosis not present

## 2015-09-06 DIAGNOSIS — E611 Iron deficiency: Secondary | ICD-10-CM | POA: Diagnosis not present

## 2015-09-06 DIAGNOSIS — Z992 Dependence on renal dialysis: Secondary | ICD-10-CM | POA: Diagnosis not present

## 2015-09-06 DIAGNOSIS — D631 Anemia in chronic kidney disease: Secondary | ICD-10-CM | POA: Diagnosis not present

## 2015-09-06 DIAGNOSIS — L299 Pruritus, unspecified: Secondary | ICD-10-CM | POA: Diagnosis not present

## 2015-09-06 DIAGNOSIS — N186 End stage renal disease: Secondary | ICD-10-CM | POA: Diagnosis not present

## 2015-09-09 DIAGNOSIS — N186 End stage renal disease: Secondary | ICD-10-CM | POA: Diagnosis not present

## 2015-09-09 DIAGNOSIS — D631 Anemia in chronic kidney disease: Secondary | ICD-10-CM | POA: Diagnosis not present

## 2015-09-09 DIAGNOSIS — N2581 Secondary hyperparathyroidism of renal origin: Secondary | ICD-10-CM | POA: Diagnosis not present

## 2015-09-09 DIAGNOSIS — L299 Pruritus, unspecified: Secondary | ICD-10-CM | POA: Diagnosis not present

## 2015-09-09 DIAGNOSIS — Z992 Dependence on renal dialysis: Secondary | ICD-10-CM | POA: Diagnosis not present

## 2015-09-09 DIAGNOSIS — E611 Iron deficiency: Secondary | ICD-10-CM | POA: Diagnosis not present

## 2015-09-11 DIAGNOSIS — L299 Pruritus, unspecified: Secondary | ICD-10-CM | POA: Diagnosis not present

## 2015-09-11 DIAGNOSIS — N2581 Secondary hyperparathyroidism of renal origin: Secondary | ICD-10-CM | POA: Diagnosis not present

## 2015-09-11 DIAGNOSIS — Z992 Dependence on renal dialysis: Secondary | ICD-10-CM | POA: Diagnosis not present

## 2015-09-11 DIAGNOSIS — E611 Iron deficiency: Secondary | ICD-10-CM | POA: Diagnosis not present

## 2015-09-11 DIAGNOSIS — D631 Anemia in chronic kidney disease: Secondary | ICD-10-CM | POA: Diagnosis not present

## 2015-09-11 DIAGNOSIS — N186 End stage renal disease: Secondary | ICD-10-CM | POA: Diagnosis not present

## 2015-09-13 DIAGNOSIS — N2581 Secondary hyperparathyroidism of renal origin: Secondary | ICD-10-CM | POA: Diagnosis not present

## 2015-09-13 DIAGNOSIS — D631 Anemia in chronic kidney disease: Secondary | ICD-10-CM | POA: Diagnosis not present

## 2015-09-13 DIAGNOSIS — E611 Iron deficiency: Secondary | ICD-10-CM | POA: Diagnosis not present

## 2015-09-13 DIAGNOSIS — N186 End stage renal disease: Secondary | ICD-10-CM | POA: Diagnosis not present

## 2015-09-13 DIAGNOSIS — L299 Pruritus, unspecified: Secondary | ICD-10-CM | POA: Diagnosis not present

## 2015-09-13 DIAGNOSIS — Z992 Dependence on renal dialysis: Secondary | ICD-10-CM | POA: Diagnosis not present

## 2015-09-16 DIAGNOSIS — N186 End stage renal disease: Secondary | ICD-10-CM | POA: Diagnosis not present

## 2015-09-16 DIAGNOSIS — Z992 Dependence on renal dialysis: Secondary | ICD-10-CM | POA: Diagnosis not present

## 2015-09-16 DIAGNOSIS — L299 Pruritus, unspecified: Secondary | ICD-10-CM | POA: Diagnosis not present

## 2015-09-16 DIAGNOSIS — E611 Iron deficiency: Secondary | ICD-10-CM | POA: Diagnosis not present

## 2015-09-16 DIAGNOSIS — D631 Anemia in chronic kidney disease: Secondary | ICD-10-CM | POA: Diagnosis not present

## 2015-09-16 DIAGNOSIS — N2581 Secondary hyperparathyroidism of renal origin: Secondary | ICD-10-CM | POA: Diagnosis not present

## 2015-09-18 DIAGNOSIS — E611 Iron deficiency: Secondary | ICD-10-CM | POA: Diagnosis not present

## 2015-09-18 DIAGNOSIS — D631 Anemia in chronic kidney disease: Secondary | ICD-10-CM | POA: Diagnosis not present

## 2015-09-18 DIAGNOSIS — N2581 Secondary hyperparathyroidism of renal origin: Secondary | ICD-10-CM | POA: Diagnosis not present

## 2015-09-18 DIAGNOSIS — N186 End stage renal disease: Secondary | ICD-10-CM | POA: Diagnosis not present

## 2015-09-18 DIAGNOSIS — L299 Pruritus, unspecified: Secondary | ICD-10-CM | POA: Diagnosis not present

## 2015-09-18 DIAGNOSIS — Z992 Dependence on renal dialysis: Secondary | ICD-10-CM | POA: Diagnosis not present

## 2015-09-20 DIAGNOSIS — N186 End stage renal disease: Secondary | ICD-10-CM | POA: Diagnosis not present

## 2015-09-20 DIAGNOSIS — N2581 Secondary hyperparathyroidism of renal origin: Secondary | ICD-10-CM | POA: Diagnosis not present

## 2015-09-20 DIAGNOSIS — D631 Anemia in chronic kidney disease: Secondary | ICD-10-CM | POA: Diagnosis not present

## 2015-09-20 DIAGNOSIS — L299 Pruritus, unspecified: Secondary | ICD-10-CM | POA: Diagnosis not present

## 2015-09-20 DIAGNOSIS — Z992 Dependence on renal dialysis: Secondary | ICD-10-CM | POA: Diagnosis not present

## 2015-09-20 DIAGNOSIS — E611 Iron deficiency: Secondary | ICD-10-CM | POA: Diagnosis not present

## 2015-09-21 DIAGNOSIS — N186 End stage renal disease: Secondary | ICD-10-CM | POA: Diagnosis not present

## 2015-09-23 DIAGNOSIS — Z992 Dependence on renal dialysis: Secondary | ICD-10-CM | POA: Diagnosis not present

## 2015-09-23 DIAGNOSIS — D631 Anemia in chronic kidney disease: Secondary | ICD-10-CM | POA: Diagnosis not present

## 2015-09-23 DIAGNOSIS — N186 End stage renal disease: Secondary | ICD-10-CM | POA: Diagnosis not present

## 2015-09-23 DIAGNOSIS — E611 Iron deficiency: Secondary | ICD-10-CM | POA: Diagnosis not present

## 2015-09-23 DIAGNOSIS — N2581 Secondary hyperparathyroidism of renal origin: Secondary | ICD-10-CM | POA: Diagnosis not present

## 2015-09-25 DIAGNOSIS — N186 End stage renal disease: Secondary | ICD-10-CM | POA: Diagnosis not present

## 2015-09-25 DIAGNOSIS — Z992 Dependence on renal dialysis: Secondary | ICD-10-CM | POA: Diagnosis not present

## 2015-09-25 DIAGNOSIS — D631 Anemia in chronic kidney disease: Secondary | ICD-10-CM | POA: Diagnosis not present

## 2015-09-25 DIAGNOSIS — E611 Iron deficiency: Secondary | ICD-10-CM | POA: Diagnosis not present

## 2015-09-25 DIAGNOSIS — N2581 Secondary hyperparathyroidism of renal origin: Secondary | ICD-10-CM | POA: Diagnosis not present

## 2015-09-27 DIAGNOSIS — Z992 Dependence on renal dialysis: Secondary | ICD-10-CM | POA: Diagnosis not present

## 2015-09-27 DIAGNOSIS — D631 Anemia in chronic kidney disease: Secondary | ICD-10-CM | POA: Diagnosis not present

## 2015-09-27 DIAGNOSIS — N186 End stage renal disease: Secondary | ICD-10-CM | POA: Diagnosis not present

## 2015-09-27 DIAGNOSIS — E611 Iron deficiency: Secondary | ICD-10-CM | POA: Diagnosis not present

## 2015-09-27 DIAGNOSIS — N2581 Secondary hyperparathyroidism of renal origin: Secondary | ICD-10-CM | POA: Diagnosis not present

## 2015-09-30 DIAGNOSIS — Z992 Dependence on renal dialysis: Secondary | ICD-10-CM | POA: Diagnosis not present

## 2015-09-30 DIAGNOSIS — E611 Iron deficiency: Secondary | ICD-10-CM | POA: Diagnosis not present

## 2015-09-30 DIAGNOSIS — N2581 Secondary hyperparathyroidism of renal origin: Secondary | ICD-10-CM | POA: Diagnosis not present

## 2015-09-30 DIAGNOSIS — N186 End stage renal disease: Secondary | ICD-10-CM | POA: Diagnosis not present

## 2015-09-30 DIAGNOSIS — D631 Anemia in chronic kidney disease: Secondary | ICD-10-CM | POA: Diagnosis not present

## 2015-10-02 DIAGNOSIS — D631 Anemia in chronic kidney disease: Secondary | ICD-10-CM | POA: Diagnosis not present

## 2015-10-02 DIAGNOSIS — E611 Iron deficiency: Secondary | ICD-10-CM | POA: Diagnosis not present

## 2015-10-02 DIAGNOSIS — Z992 Dependence on renal dialysis: Secondary | ICD-10-CM | POA: Diagnosis not present

## 2015-10-02 DIAGNOSIS — N2581 Secondary hyperparathyroidism of renal origin: Secondary | ICD-10-CM | POA: Diagnosis not present

## 2015-10-02 DIAGNOSIS — N186 End stage renal disease: Secondary | ICD-10-CM | POA: Diagnosis not present

## 2015-10-04 DIAGNOSIS — N2581 Secondary hyperparathyroidism of renal origin: Secondary | ICD-10-CM | POA: Diagnosis not present

## 2015-10-04 DIAGNOSIS — Z992 Dependence on renal dialysis: Secondary | ICD-10-CM | POA: Diagnosis not present

## 2015-10-04 DIAGNOSIS — E611 Iron deficiency: Secondary | ICD-10-CM | POA: Diagnosis not present

## 2015-10-04 DIAGNOSIS — N186 End stage renal disease: Secondary | ICD-10-CM | POA: Diagnosis not present

## 2015-10-04 DIAGNOSIS — D631 Anemia in chronic kidney disease: Secondary | ICD-10-CM | POA: Diagnosis not present

## 2015-10-07 DIAGNOSIS — Z992 Dependence on renal dialysis: Secondary | ICD-10-CM | POA: Diagnosis not present

## 2015-10-07 DIAGNOSIS — N186 End stage renal disease: Secondary | ICD-10-CM | POA: Diagnosis not present

## 2015-10-07 DIAGNOSIS — N2581 Secondary hyperparathyroidism of renal origin: Secondary | ICD-10-CM | POA: Diagnosis not present

## 2015-10-07 DIAGNOSIS — E611 Iron deficiency: Secondary | ICD-10-CM | POA: Diagnosis not present

## 2015-10-07 DIAGNOSIS — D631 Anemia in chronic kidney disease: Secondary | ICD-10-CM | POA: Diagnosis not present

## 2015-10-09 DIAGNOSIS — N186 End stage renal disease: Secondary | ICD-10-CM | POA: Diagnosis not present

## 2015-10-09 DIAGNOSIS — Z992 Dependence on renal dialysis: Secondary | ICD-10-CM | POA: Diagnosis not present

## 2015-10-09 DIAGNOSIS — D631 Anemia in chronic kidney disease: Secondary | ICD-10-CM | POA: Diagnosis not present

## 2015-10-09 DIAGNOSIS — N2581 Secondary hyperparathyroidism of renal origin: Secondary | ICD-10-CM | POA: Diagnosis not present

## 2015-10-09 DIAGNOSIS — E611 Iron deficiency: Secondary | ICD-10-CM | POA: Diagnosis not present

## 2015-10-11 DIAGNOSIS — Z992 Dependence on renal dialysis: Secondary | ICD-10-CM | POA: Diagnosis not present

## 2015-10-11 DIAGNOSIS — N2581 Secondary hyperparathyroidism of renal origin: Secondary | ICD-10-CM | POA: Diagnosis not present

## 2015-10-11 DIAGNOSIS — E611 Iron deficiency: Secondary | ICD-10-CM | POA: Diagnosis not present

## 2015-10-11 DIAGNOSIS — N186 End stage renal disease: Secondary | ICD-10-CM | POA: Diagnosis not present

## 2015-10-11 DIAGNOSIS — D631 Anemia in chronic kidney disease: Secondary | ICD-10-CM | POA: Diagnosis not present

## 2015-10-14 DIAGNOSIS — D631 Anemia in chronic kidney disease: Secondary | ICD-10-CM | POA: Diagnosis not present

## 2015-10-14 DIAGNOSIS — N186 End stage renal disease: Secondary | ICD-10-CM | POA: Diagnosis not present

## 2015-10-14 DIAGNOSIS — N2581 Secondary hyperparathyroidism of renal origin: Secondary | ICD-10-CM | POA: Diagnosis not present

## 2015-10-14 DIAGNOSIS — Z992 Dependence on renal dialysis: Secondary | ICD-10-CM | POA: Diagnosis not present

## 2015-10-14 DIAGNOSIS — E611 Iron deficiency: Secondary | ICD-10-CM | POA: Diagnosis not present

## 2015-10-16 DIAGNOSIS — N2581 Secondary hyperparathyroidism of renal origin: Secondary | ICD-10-CM | POA: Diagnosis not present

## 2015-10-16 DIAGNOSIS — D631 Anemia in chronic kidney disease: Secondary | ICD-10-CM | POA: Diagnosis not present

## 2015-10-16 DIAGNOSIS — N186 End stage renal disease: Secondary | ICD-10-CM | POA: Diagnosis not present

## 2015-10-16 DIAGNOSIS — E611 Iron deficiency: Secondary | ICD-10-CM | POA: Diagnosis not present

## 2015-10-16 DIAGNOSIS — Z992 Dependence on renal dialysis: Secondary | ICD-10-CM | POA: Diagnosis not present

## 2015-10-18 DIAGNOSIS — N186 End stage renal disease: Secondary | ICD-10-CM | POA: Diagnosis not present

## 2015-10-18 DIAGNOSIS — D631 Anemia in chronic kidney disease: Secondary | ICD-10-CM | POA: Diagnosis not present

## 2015-10-18 DIAGNOSIS — Z992 Dependence on renal dialysis: Secondary | ICD-10-CM | POA: Diagnosis not present

## 2015-10-18 DIAGNOSIS — N2581 Secondary hyperparathyroidism of renal origin: Secondary | ICD-10-CM | POA: Diagnosis not present

## 2015-10-18 DIAGNOSIS — E611 Iron deficiency: Secondary | ICD-10-CM | POA: Diagnosis not present

## 2015-10-21 DIAGNOSIS — Z992 Dependence on renal dialysis: Secondary | ICD-10-CM | POA: Diagnosis not present

## 2015-10-21 DIAGNOSIS — N2581 Secondary hyperparathyroidism of renal origin: Secondary | ICD-10-CM | POA: Diagnosis not present

## 2015-10-21 DIAGNOSIS — D631 Anemia in chronic kidney disease: Secondary | ICD-10-CM | POA: Diagnosis not present

## 2015-10-21 DIAGNOSIS — N186 End stage renal disease: Secondary | ICD-10-CM | POA: Diagnosis not present

## 2015-10-21 DIAGNOSIS — E611 Iron deficiency: Secondary | ICD-10-CM | POA: Diagnosis not present

## 2015-10-22 DIAGNOSIS — N186 End stage renal disease: Secondary | ICD-10-CM | POA: Diagnosis not present

## 2015-10-22 DIAGNOSIS — Z992 Dependence on renal dialysis: Secondary | ICD-10-CM | POA: Diagnosis not present

## 2015-10-23 DIAGNOSIS — N2581 Secondary hyperparathyroidism of renal origin: Secondary | ICD-10-CM | POA: Diagnosis not present

## 2015-10-23 DIAGNOSIS — Z992 Dependence on renal dialysis: Secondary | ICD-10-CM | POA: Diagnosis not present

## 2015-10-23 DIAGNOSIS — E611 Iron deficiency: Secondary | ICD-10-CM | POA: Diagnosis not present

## 2015-10-23 DIAGNOSIS — D631 Anemia in chronic kidney disease: Secondary | ICD-10-CM | POA: Diagnosis not present

## 2015-10-23 DIAGNOSIS — L299 Pruritus, unspecified: Secondary | ICD-10-CM | POA: Diagnosis not present

## 2015-10-23 DIAGNOSIS — N186 End stage renal disease: Secondary | ICD-10-CM | POA: Diagnosis not present

## 2015-10-25 DIAGNOSIS — Z992 Dependence on renal dialysis: Secondary | ICD-10-CM | POA: Diagnosis not present

## 2015-10-25 DIAGNOSIS — D631 Anemia in chronic kidney disease: Secondary | ICD-10-CM | POA: Diagnosis not present

## 2015-10-25 DIAGNOSIS — N2581 Secondary hyperparathyroidism of renal origin: Secondary | ICD-10-CM | POA: Diagnosis not present

## 2015-10-25 DIAGNOSIS — N186 End stage renal disease: Secondary | ICD-10-CM | POA: Diagnosis not present

## 2015-10-25 DIAGNOSIS — E611 Iron deficiency: Secondary | ICD-10-CM | POA: Diagnosis not present

## 2015-10-25 DIAGNOSIS — L299 Pruritus, unspecified: Secondary | ICD-10-CM | POA: Diagnosis not present

## 2015-10-28 DIAGNOSIS — L299 Pruritus, unspecified: Secondary | ICD-10-CM | POA: Diagnosis not present

## 2015-10-28 DIAGNOSIS — D631 Anemia in chronic kidney disease: Secondary | ICD-10-CM | POA: Diagnosis not present

## 2015-10-28 DIAGNOSIS — N2581 Secondary hyperparathyroidism of renal origin: Secondary | ICD-10-CM | POA: Diagnosis not present

## 2015-10-28 DIAGNOSIS — E611 Iron deficiency: Secondary | ICD-10-CM | POA: Diagnosis not present

## 2015-10-28 DIAGNOSIS — N186 End stage renal disease: Secondary | ICD-10-CM | POA: Diagnosis not present

## 2015-10-28 DIAGNOSIS — Z992 Dependence on renal dialysis: Secondary | ICD-10-CM | POA: Diagnosis not present

## 2015-10-30 DIAGNOSIS — N2581 Secondary hyperparathyroidism of renal origin: Secondary | ICD-10-CM | POA: Diagnosis not present

## 2015-10-30 DIAGNOSIS — D631 Anemia in chronic kidney disease: Secondary | ICD-10-CM | POA: Diagnosis not present

## 2015-10-30 DIAGNOSIS — Z992 Dependence on renal dialysis: Secondary | ICD-10-CM | POA: Diagnosis not present

## 2015-10-30 DIAGNOSIS — E611 Iron deficiency: Secondary | ICD-10-CM | POA: Diagnosis not present

## 2015-10-30 DIAGNOSIS — L299 Pruritus, unspecified: Secondary | ICD-10-CM | POA: Diagnosis not present

## 2015-10-30 DIAGNOSIS — N186 End stage renal disease: Secondary | ICD-10-CM | POA: Diagnosis not present

## 2015-11-01 DIAGNOSIS — N186 End stage renal disease: Secondary | ICD-10-CM | POA: Diagnosis not present

## 2015-11-01 DIAGNOSIS — D631 Anemia in chronic kidney disease: Secondary | ICD-10-CM | POA: Diagnosis not present

## 2015-11-01 DIAGNOSIS — L299 Pruritus, unspecified: Secondary | ICD-10-CM | POA: Diagnosis not present

## 2015-11-01 DIAGNOSIS — N2581 Secondary hyperparathyroidism of renal origin: Secondary | ICD-10-CM | POA: Diagnosis not present

## 2015-11-01 DIAGNOSIS — Z992 Dependence on renal dialysis: Secondary | ICD-10-CM | POA: Diagnosis not present

## 2015-11-01 DIAGNOSIS — E611 Iron deficiency: Secondary | ICD-10-CM | POA: Diagnosis not present

## 2015-11-04 DIAGNOSIS — N186 End stage renal disease: Secondary | ICD-10-CM | POA: Diagnosis not present

## 2015-11-04 DIAGNOSIS — E611 Iron deficiency: Secondary | ICD-10-CM | POA: Diagnosis not present

## 2015-11-04 DIAGNOSIS — D631 Anemia in chronic kidney disease: Secondary | ICD-10-CM | POA: Diagnosis not present

## 2015-11-04 DIAGNOSIS — L299 Pruritus, unspecified: Secondary | ICD-10-CM | POA: Diagnosis not present

## 2015-11-04 DIAGNOSIS — Z992 Dependence on renal dialysis: Secondary | ICD-10-CM | POA: Diagnosis not present

## 2015-11-04 DIAGNOSIS — N2581 Secondary hyperparathyroidism of renal origin: Secondary | ICD-10-CM | POA: Diagnosis not present

## 2015-11-06 DIAGNOSIS — N2581 Secondary hyperparathyroidism of renal origin: Secondary | ICD-10-CM | POA: Diagnosis not present

## 2015-11-06 DIAGNOSIS — D631 Anemia in chronic kidney disease: Secondary | ICD-10-CM | POA: Diagnosis not present

## 2015-11-06 DIAGNOSIS — L299 Pruritus, unspecified: Secondary | ICD-10-CM | POA: Diagnosis not present

## 2015-11-06 DIAGNOSIS — N186 End stage renal disease: Secondary | ICD-10-CM | POA: Diagnosis not present

## 2015-11-06 DIAGNOSIS — Z992 Dependence on renal dialysis: Secondary | ICD-10-CM | POA: Diagnosis not present

## 2015-11-06 DIAGNOSIS — E611 Iron deficiency: Secondary | ICD-10-CM | POA: Diagnosis not present

## 2015-11-08 DIAGNOSIS — D631 Anemia in chronic kidney disease: Secondary | ICD-10-CM | POA: Diagnosis not present

## 2015-11-08 DIAGNOSIS — Z992 Dependence on renal dialysis: Secondary | ICD-10-CM | POA: Diagnosis not present

## 2015-11-08 DIAGNOSIS — N2581 Secondary hyperparathyroidism of renal origin: Secondary | ICD-10-CM | POA: Diagnosis not present

## 2015-11-08 DIAGNOSIS — E611 Iron deficiency: Secondary | ICD-10-CM | POA: Diagnosis not present

## 2015-11-08 DIAGNOSIS — N186 End stage renal disease: Secondary | ICD-10-CM | POA: Diagnosis not present

## 2015-11-08 DIAGNOSIS — L299 Pruritus, unspecified: Secondary | ICD-10-CM | POA: Diagnosis not present

## 2015-11-11 DIAGNOSIS — L299 Pruritus, unspecified: Secondary | ICD-10-CM | POA: Diagnosis not present

## 2015-11-11 DIAGNOSIS — N186 End stage renal disease: Secondary | ICD-10-CM | POA: Diagnosis not present

## 2015-11-11 DIAGNOSIS — N2581 Secondary hyperparathyroidism of renal origin: Secondary | ICD-10-CM | POA: Diagnosis not present

## 2015-11-11 DIAGNOSIS — Z992 Dependence on renal dialysis: Secondary | ICD-10-CM | POA: Diagnosis not present

## 2015-11-11 DIAGNOSIS — D631 Anemia in chronic kidney disease: Secondary | ICD-10-CM | POA: Diagnosis not present

## 2015-11-11 DIAGNOSIS — E611 Iron deficiency: Secondary | ICD-10-CM | POA: Diagnosis not present

## 2015-11-13 DIAGNOSIS — Z992 Dependence on renal dialysis: Secondary | ICD-10-CM | POA: Diagnosis not present

## 2015-11-13 DIAGNOSIS — E611 Iron deficiency: Secondary | ICD-10-CM | POA: Diagnosis not present

## 2015-11-13 DIAGNOSIS — N2581 Secondary hyperparathyroidism of renal origin: Secondary | ICD-10-CM | POA: Diagnosis not present

## 2015-11-13 DIAGNOSIS — N186 End stage renal disease: Secondary | ICD-10-CM | POA: Diagnosis not present

## 2015-11-13 DIAGNOSIS — L299 Pruritus, unspecified: Secondary | ICD-10-CM | POA: Diagnosis not present

## 2015-11-13 DIAGNOSIS — D631 Anemia in chronic kidney disease: Secondary | ICD-10-CM | POA: Diagnosis not present

## 2015-11-15 DIAGNOSIS — N186 End stage renal disease: Secondary | ICD-10-CM | POA: Diagnosis not present

## 2015-11-15 DIAGNOSIS — D631 Anemia in chronic kidney disease: Secondary | ICD-10-CM | POA: Diagnosis not present

## 2015-11-15 DIAGNOSIS — L299 Pruritus, unspecified: Secondary | ICD-10-CM | POA: Diagnosis not present

## 2015-11-15 DIAGNOSIS — E611 Iron deficiency: Secondary | ICD-10-CM | POA: Diagnosis not present

## 2015-11-15 DIAGNOSIS — N2581 Secondary hyperparathyroidism of renal origin: Secondary | ICD-10-CM | POA: Diagnosis not present

## 2015-11-15 DIAGNOSIS — Z992 Dependence on renal dialysis: Secondary | ICD-10-CM | POA: Diagnosis not present

## 2015-11-18 DIAGNOSIS — Z992 Dependence on renal dialysis: Secondary | ICD-10-CM | POA: Diagnosis not present

## 2015-11-18 DIAGNOSIS — E611 Iron deficiency: Secondary | ICD-10-CM | POA: Diagnosis not present

## 2015-11-18 DIAGNOSIS — D631 Anemia in chronic kidney disease: Secondary | ICD-10-CM | POA: Diagnosis not present

## 2015-11-18 DIAGNOSIS — L299 Pruritus, unspecified: Secondary | ICD-10-CM | POA: Diagnosis not present

## 2015-11-18 DIAGNOSIS — N186 End stage renal disease: Secondary | ICD-10-CM | POA: Diagnosis not present

## 2015-11-18 DIAGNOSIS — N2581 Secondary hyperparathyroidism of renal origin: Secondary | ICD-10-CM | POA: Diagnosis not present

## 2015-11-20 DIAGNOSIS — E611 Iron deficiency: Secondary | ICD-10-CM | POA: Diagnosis not present

## 2015-11-20 DIAGNOSIS — D631 Anemia in chronic kidney disease: Secondary | ICD-10-CM | POA: Diagnosis not present

## 2015-11-20 DIAGNOSIS — Z992 Dependence on renal dialysis: Secondary | ICD-10-CM | POA: Diagnosis not present

## 2015-11-20 DIAGNOSIS — N2581 Secondary hyperparathyroidism of renal origin: Secondary | ICD-10-CM | POA: Diagnosis not present

## 2015-11-20 DIAGNOSIS — L299 Pruritus, unspecified: Secondary | ICD-10-CM | POA: Diagnosis not present

## 2015-11-20 DIAGNOSIS — N186 End stage renal disease: Secondary | ICD-10-CM | POA: Diagnosis not present

## 2015-11-21 DIAGNOSIS — Z992 Dependence on renal dialysis: Secondary | ICD-10-CM | POA: Diagnosis not present

## 2015-11-21 DIAGNOSIS — N186 End stage renal disease: Secondary | ICD-10-CM | POA: Diagnosis not present

## 2015-11-22 DIAGNOSIS — E611 Iron deficiency: Secondary | ICD-10-CM | POA: Diagnosis not present

## 2015-11-22 DIAGNOSIS — N186 End stage renal disease: Secondary | ICD-10-CM | POA: Diagnosis not present

## 2015-11-22 DIAGNOSIS — Z992 Dependence on renal dialysis: Secondary | ICD-10-CM | POA: Diagnosis not present

## 2015-11-22 DIAGNOSIS — D509 Iron deficiency anemia, unspecified: Secondary | ICD-10-CM | POA: Diagnosis not present

## 2015-11-22 DIAGNOSIS — N2581 Secondary hyperparathyroidism of renal origin: Secondary | ICD-10-CM | POA: Diagnosis not present

## 2015-11-22 DIAGNOSIS — D631 Anemia in chronic kidney disease: Secondary | ICD-10-CM | POA: Diagnosis not present

## 2015-11-25 DIAGNOSIS — D631 Anemia in chronic kidney disease: Secondary | ICD-10-CM | POA: Diagnosis not present

## 2015-11-25 DIAGNOSIS — N186 End stage renal disease: Secondary | ICD-10-CM | POA: Diagnosis not present

## 2015-11-25 DIAGNOSIS — Z992 Dependence on renal dialysis: Secondary | ICD-10-CM | POA: Diagnosis not present

## 2015-11-25 DIAGNOSIS — N2581 Secondary hyperparathyroidism of renal origin: Secondary | ICD-10-CM | POA: Diagnosis not present

## 2015-11-25 DIAGNOSIS — E611 Iron deficiency: Secondary | ICD-10-CM | POA: Diagnosis not present

## 2015-11-25 DIAGNOSIS — D509 Iron deficiency anemia, unspecified: Secondary | ICD-10-CM | POA: Diagnosis not present

## 2015-11-27 DIAGNOSIS — N186 End stage renal disease: Secondary | ICD-10-CM | POA: Diagnosis not present

## 2015-11-27 DIAGNOSIS — E611 Iron deficiency: Secondary | ICD-10-CM | POA: Diagnosis not present

## 2015-11-27 DIAGNOSIS — N2581 Secondary hyperparathyroidism of renal origin: Secondary | ICD-10-CM | POA: Diagnosis not present

## 2015-11-27 DIAGNOSIS — D509 Iron deficiency anemia, unspecified: Secondary | ICD-10-CM | POA: Diagnosis not present

## 2015-11-27 DIAGNOSIS — D631 Anemia in chronic kidney disease: Secondary | ICD-10-CM | POA: Diagnosis not present

## 2015-11-27 DIAGNOSIS — Z992 Dependence on renal dialysis: Secondary | ICD-10-CM | POA: Diagnosis not present

## 2015-11-29 DIAGNOSIS — D631 Anemia in chronic kidney disease: Secondary | ICD-10-CM | POA: Diagnosis not present

## 2015-11-29 DIAGNOSIS — N186 End stage renal disease: Secondary | ICD-10-CM | POA: Diagnosis not present

## 2015-11-29 DIAGNOSIS — D509 Iron deficiency anemia, unspecified: Secondary | ICD-10-CM | POA: Diagnosis not present

## 2015-11-29 DIAGNOSIS — E611 Iron deficiency: Secondary | ICD-10-CM | POA: Diagnosis not present

## 2015-11-29 DIAGNOSIS — N2581 Secondary hyperparathyroidism of renal origin: Secondary | ICD-10-CM | POA: Diagnosis not present

## 2015-11-29 DIAGNOSIS — Z992 Dependence on renal dialysis: Secondary | ICD-10-CM | POA: Diagnosis not present

## 2015-12-02 DIAGNOSIS — N186 End stage renal disease: Secondary | ICD-10-CM | POA: Diagnosis not present

## 2015-12-02 DIAGNOSIS — E611 Iron deficiency: Secondary | ICD-10-CM | POA: Diagnosis not present

## 2015-12-02 DIAGNOSIS — D509 Iron deficiency anemia, unspecified: Secondary | ICD-10-CM | POA: Diagnosis not present

## 2015-12-02 DIAGNOSIS — D631 Anemia in chronic kidney disease: Secondary | ICD-10-CM | POA: Diagnosis not present

## 2015-12-02 DIAGNOSIS — N2581 Secondary hyperparathyroidism of renal origin: Secondary | ICD-10-CM | POA: Diagnosis not present

## 2015-12-02 DIAGNOSIS — Z992 Dependence on renal dialysis: Secondary | ICD-10-CM | POA: Diagnosis not present

## 2015-12-04 DIAGNOSIS — D509 Iron deficiency anemia, unspecified: Secondary | ICD-10-CM | POA: Diagnosis not present

## 2015-12-04 DIAGNOSIS — E611 Iron deficiency: Secondary | ICD-10-CM | POA: Diagnosis not present

## 2015-12-04 DIAGNOSIS — D631 Anemia in chronic kidney disease: Secondary | ICD-10-CM | POA: Diagnosis not present

## 2015-12-04 DIAGNOSIS — Z992 Dependence on renal dialysis: Secondary | ICD-10-CM | POA: Diagnosis not present

## 2015-12-04 DIAGNOSIS — N2581 Secondary hyperparathyroidism of renal origin: Secondary | ICD-10-CM | POA: Diagnosis not present

## 2015-12-04 DIAGNOSIS — N186 End stage renal disease: Secondary | ICD-10-CM | POA: Diagnosis not present

## 2015-12-06 DIAGNOSIS — N2581 Secondary hyperparathyroidism of renal origin: Secondary | ICD-10-CM | POA: Diagnosis not present

## 2015-12-06 DIAGNOSIS — D509 Iron deficiency anemia, unspecified: Secondary | ICD-10-CM | POA: Diagnosis not present

## 2015-12-06 DIAGNOSIS — Z992 Dependence on renal dialysis: Secondary | ICD-10-CM | POA: Diagnosis not present

## 2015-12-06 DIAGNOSIS — N186 End stage renal disease: Secondary | ICD-10-CM | POA: Diagnosis not present

## 2015-12-06 DIAGNOSIS — E611 Iron deficiency: Secondary | ICD-10-CM | POA: Diagnosis not present

## 2015-12-06 DIAGNOSIS — D631 Anemia in chronic kidney disease: Secondary | ICD-10-CM | POA: Diagnosis not present

## 2015-12-09 DIAGNOSIS — E611 Iron deficiency: Secondary | ICD-10-CM | POA: Diagnosis not present

## 2015-12-09 DIAGNOSIS — D631 Anemia in chronic kidney disease: Secondary | ICD-10-CM | POA: Diagnosis not present

## 2015-12-09 DIAGNOSIS — N2581 Secondary hyperparathyroidism of renal origin: Secondary | ICD-10-CM | POA: Diagnosis not present

## 2015-12-09 DIAGNOSIS — N186 End stage renal disease: Secondary | ICD-10-CM | POA: Diagnosis not present

## 2015-12-09 DIAGNOSIS — Z992 Dependence on renal dialysis: Secondary | ICD-10-CM | POA: Diagnosis not present

## 2015-12-09 DIAGNOSIS — D509 Iron deficiency anemia, unspecified: Secondary | ICD-10-CM | POA: Diagnosis not present

## 2015-12-11 DIAGNOSIS — D509 Iron deficiency anemia, unspecified: Secondary | ICD-10-CM | POA: Diagnosis not present

## 2015-12-11 DIAGNOSIS — N2581 Secondary hyperparathyroidism of renal origin: Secondary | ICD-10-CM | POA: Diagnosis not present

## 2015-12-11 DIAGNOSIS — E611 Iron deficiency: Secondary | ICD-10-CM | POA: Diagnosis not present

## 2015-12-11 DIAGNOSIS — N186 End stage renal disease: Secondary | ICD-10-CM | POA: Diagnosis not present

## 2015-12-11 DIAGNOSIS — Z992 Dependence on renal dialysis: Secondary | ICD-10-CM | POA: Diagnosis not present

## 2015-12-11 DIAGNOSIS — D631 Anemia in chronic kidney disease: Secondary | ICD-10-CM | POA: Diagnosis not present

## 2015-12-13 DIAGNOSIS — Z992 Dependence on renal dialysis: Secondary | ICD-10-CM | POA: Diagnosis not present

## 2015-12-13 DIAGNOSIS — D509 Iron deficiency anemia, unspecified: Secondary | ICD-10-CM | POA: Diagnosis not present

## 2015-12-13 DIAGNOSIS — E611 Iron deficiency: Secondary | ICD-10-CM | POA: Diagnosis not present

## 2015-12-13 DIAGNOSIS — N2581 Secondary hyperparathyroidism of renal origin: Secondary | ICD-10-CM | POA: Diagnosis not present

## 2015-12-13 DIAGNOSIS — N186 End stage renal disease: Secondary | ICD-10-CM | POA: Diagnosis not present

## 2015-12-13 DIAGNOSIS — D631 Anemia in chronic kidney disease: Secondary | ICD-10-CM | POA: Diagnosis not present

## 2015-12-16 DIAGNOSIS — D631 Anemia in chronic kidney disease: Secondary | ICD-10-CM | POA: Diagnosis not present

## 2015-12-16 DIAGNOSIS — N186 End stage renal disease: Secondary | ICD-10-CM | POA: Diagnosis not present

## 2015-12-16 DIAGNOSIS — Z992 Dependence on renal dialysis: Secondary | ICD-10-CM | POA: Diagnosis not present

## 2015-12-16 DIAGNOSIS — N2581 Secondary hyperparathyroidism of renal origin: Secondary | ICD-10-CM | POA: Diagnosis not present

## 2015-12-16 DIAGNOSIS — D509 Iron deficiency anemia, unspecified: Secondary | ICD-10-CM | POA: Diagnosis not present

## 2015-12-16 DIAGNOSIS — E611 Iron deficiency: Secondary | ICD-10-CM | POA: Diagnosis not present

## 2015-12-18 DIAGNOSIS — E611 Iron deficiency: Secondary | ICD-10-CM | POA: Diagnosis not present

## 2015-12-18 DIAGNOSIS — N2581 Secondary hyperparathyroidism of renal origin: Secondary | ICD-10-CM | POA: Diagnosis not present

## 2015-12-18 DIAGNOSIS — D509 Iron deficiency anemia, unspecified: Secondary | ICD-10-CM | POA: Diagnosis not present

## 2015-12-18 DIAGNOSIS — Z992 Dependence on renal dialysis: Secondary | ICD-10-CM | POA: Diagnosis not present

## 2015-12-18 DIAGNOSIS — D631 Anemia in chronic kidney disease: Secondary | ICD-10-CM | POA: Diagnosis not present

## 2015-12-18 DIAGNOSIS — N186 End stage renal disease: Secondary | ICD-10-CM | POA: Diagnosis not present

## 2015-12-20 DIAGNOSIS — N2581 Secondary hyperparathyroidism of renal origin: Secondary | ICD-10-CM | POA: Diagnosis not present

## 2015-12-20 DIAGNOSIS — E611 Iron deficiency: Secondary | ICD-10-CM | POA: Diagnosis not present

## 2015-12-20 DIAGNOSIS — D509 Iron deficiency anemia, unspecified: Secondary | ICD-10-CM | POA: Diagnosis not present

## 2015-12-20 DIAGNOSIS — N186 End stage renal disease: Secondary | ICD-10-CM | POA: Diagnosis not present

## 2015-12-20 DIAGNOSIS — Z992 Dependence on renal dialysis: Secondary | ICD-10-CM | POA: Diagnosis not present

## 2015-12-20 DIAGNOSIS — D631 Anemia in chronic kidney disease: Secondary | ICD-10-CM | POA: Diagnosis not present

## 2015-12-22 DIAGNOSIS — Z992 Dependence on renal dialysis: Secondary | ICD-10-CM | POA: Diagnosis not present

## 2015-12-22 DIAGNOSIS — N186 End stage renal disease: Secondary | ICD-10-CM | POA: Diagnosis not present

## 2015-12-23 DIAGNOSIS — D631 Anemia in chronic kidney disease: Secondary | ICD-10-CM | POA: Diagnosis not present

## 2015-12-23 DIAGNOSIS — N2581 Secondary hyperparathyroidism of renal origin: Secondary | ICD-10-CM | POA: Diagnosis not present

## 2015-12-23 DIAGNOSIS — Z992 Dependence on renal dialysis: Secondary | ICD-10-CM | POA: Diagnosis not present

## 2015-12-23 DIAGNOSIS — E611 Iron deficiency: Secondary | ICD-10-CM | POA: Diagnosis not present

## 2015-12-23 DIAGNOSIS — N186 End stage renal disease: Secondary | ICD-10-CM | POA: Diagnosis not present

## 2015-12-25 DIAGNOSIS — Z992 Dependence on renal dialysis: Secondary | ICD-10-CM | POA: Diagnosis not present

## 2015-12-25 DIAGNOSIS — N186 End stage renal disease: Secondary | ICD-10-CM | POA: Diagnosis not present

## 2015-12-25 DIAGNOSIS — E611 Iron deficiency: Secondary | ICD-10-CM | POA: Diagnosis not present

## 2015-12-25 DIAGNOSIS — D631 Anemia in chronic kidney disease: Secondary | ICD-10-CM | POA: Diagnosis not present

## 2015-12-25 DIAGNOSIS — N2581 Secondary hyperparathyroidism of renal origin: Secondary | ICD-10-CM | POA: Diagnosis not present

## 2015-12-27 DIAGNOSIS — D631 Anemia in chronic kidney disease: Secondary | ICD-10-CM | POA: Diagnosis not present

## 2015-12-27 DIAGNOSIS — Z992 Dependence on renal dialysis: Secondary | ICD-10-CM | POA: Diagnosis not present

## 2015-12-27 DIAGNOSIS — N186 End stage renal disease: Secondary | ICD-10-CM | POA: Diagnosis not present

## 2015-12-27 DIAGNOSIS — E611 Iron deficiency: Secondary | ICD-10-CM | POA: Diagnosis not present

## 2015-12-27 DIAGNOSIS — N2581 Secondary hyperparathyroidism of renal origin: Secondary | ICD-10-CM | POA: Diagnosis not present

## 2015-12-30 DIAGNOSIS — D631 Anemia in chronic kidney disease: Secondary | ICD-10-CM | POA: Diagnosis not present

## 2015-12-30 DIAGNOSIS — E611 Iron deficiency: Secondary | ICD-10-CM | POA: Diagnosis not present

## 2015-12-30 DIAGNOSIS — Z992 Dependence on renal dialysis: Secondary | ICD-10-CM | POA: Diagnosis not present

## 2015-12-30 DIAGNOSIS — N2581 Secondary hyperparathyroidism of renal origin: Secondary | ICD-10-CM | POA: Diagnosis not present

## 2015-12-30 DIAGNOSIS — N186 End stage renal disease: Secondary | ICD-10-CM | POA: Diagnosis not present

## 2016-01-01 DIAGNOSIS — N2581 Secondary hyperparathyroidism of renal origin: Secondary | ICD-10-CM | POA: Diagnosis not present

## 2016-01-01 DIAGNOSIS — N186 End stage renal disease: Secondary | ICD-10-CM | POA: Diagnosis not present

## 2016-01-01 DIAGNOSIS — D631 Anemia in chronic kidney disease: Secondary | ICD-10-CM | POA: Diagnosis not present

## 2016-01-01 DIAGNOSIS — E611 Iron deficiency: Secondary | ICD-10-CM | POA: Diagnosis not present

## 2016-01-01 DIAGNOSIS — Z992 Dependence on renal dialysis: Secondary | ICD-10-CM | POA: Diagnosis not present

## 2016-01-03 DIAGNOSIS — N2581 Secondary hyperparathyroidism of renal origin: Secondary | ICD-10-CM | POA: Diagnosis not present

## 2016-01-03 DIAGNOSIS — D631 Anemia in chronic kidney disease: Secondary | ICD-10-CM | POA: Diagnosis not present

## 2016-01-03 DIAGNOSIS — E611 Iron deficiency: Secondary | ICD-10-CM | POA: Diagnosis not present

## 2016-01-03 DIAGNOSIS — N186 End stage renal disease: Secondary | ICD-10-CM | POA: Diagnosis not present

## 2016-01-03 DIAGNOSIS — Z992 Dependence on renal dialysis: Secondary | ICD-10-CM | POA: Diagnosis not present

## 2016-01-06 DIAGNOSIS — N186 End stage renal disease: Secondary | ICD-10-CM | POA: Diagnosis not present

## 2016-01-06 DIAGNOSIS — N2581 Secondary hyperparathyroidism of renal origin: Secondary | ICD-10-CM | POA: Diagnosis not present

## 2016-01-06 DIAGNOSIS — E611 Iron deficiency: Secondary | ICD-10-CM | POA: Diagnosis not present

## 2016-01-06 DIAGNOSIS — D631 Anemia in chronic kidney disease: Secondary | ICD-10-CM | POA: Diagnosis not present

## 2016-01-06 DIAGNOSIS — Z992 Dependence on renal dialysis: Secondary | ICD-10-CM | POA: Diagnosis not present

## 2016-01-07 DIAGNOSIS — Z0000 Encounter for general adult medical examination without abnormal findings: Secondary | ICD-10-CM | POA: Diagnosis not present

## 2016-01-08 DIAGNOSIS — E611 Iron deficiency: Secondary | ICD-10-CM | POA: Diagnosis not present

## 2016-01-08 DIAGNOSIS — N186 End stage renal disease: Secondary | ICD-10-CM | POA: Diagnosis not present

## 2016-01-08 DIAGNOSIS — D631 Anemia in chronic kidney disease: Secondary | ICD-10-CM | POA: Diagnosis not present

## 2016-01-08 DIAGNOSIS — Z992 Dependence on renal dialysis: Secondary | ICD-10-CM | POA: Diagnosis not present

## 2016-01-08 DIAGNOSIS — N2581 Secondary hyperparathyroidism of renal origin: Secondary | ICD-10-CM | POA: Diagnosis not present

## 2016-01-10 DIAGNOSIS — D631 Anemia in chronic kidney disease: Secondary | ICD-10-CM | POA: Diagnosis not present

## 2016-01-10 DIAGNOSIS — N2581 Secondary hyperparathyroidism of renal origin: Secondary | ICD-10-CM | POA: Diagnosis not present

## 2016-01-10 DIAGNOSIS — Z992 Dependence on renal dialysis: Secondary | ICD-10-CM | POA: Diagnosis not present

## 2016-01-10 DIAGNOSIS — E611 Iron deficiency: Secondary | ICD-10-CM | POA: Diagnosis not present

## 2016-01-10 DIAGNOSIS — N186 End stage renal disease: Secondary | ICD-10-CM | POA: Diagnosis not present

## 2016-01-13 DIAGNOSIS — D631 Anemia in chronic kidney disease: Secondary | ICD-10-CM | POA: Diagnosis not present

## 2016-01-13 DIAGNOSIS — N186 End stage renal disease: Secondary | ICD-10-CM | POA: Diagnosis not present

## 2016-01-13 DIAGNOSIS — Z992 Dependence on renal dialysis: Secondary | ICD-10-CM | POA: Diagnosis not present

## 2016-01-13 DIAGNOSIS — N2581 Secondary hyperparathyroidism of renal origin: Secondary | ICD-10-CM | POA: Diagnosis not present

## 2016-01-13 DIAGNOSIS — E611 Iron deficiency: Secondary | ICD-10-CM | POA: Diagnosis not present

## 2016-01-15 DIAGNOSIS — E611 Iron deficiency: Secondary | ICD-10-CM | POA: Diagnosis not present

## 2016-01-15 DIAGNOSIS — Z992 Dependence on renal dialysis: Secondary | ICD-10-CM | POA: Diagnosis not present

## 2016-01-15 DIAGNOSIS — D631 Anemia in chronic kidney disease: Secondary | ICD-10-CM | POA: Diagnosis not present

## 2016-01-15 DIAGNOSIS — N186 End stage renal disease: Secondary | ICD-10-CM | POA: Diagnosis not present

## 2016-01-15 DIAGNOSIS — N2581 Secondary hyperparathyroidism of renal origin: Secondary | ICD-10-CM | POA: Diagnosis not present

## 2016-01-17 DIAGNOSIS — Z992 Dependence on renal dialysis: Secondary | ICD-10-CM | POA: Diagnosis not present

## 2016-01-17 DIAGNOSIS — N2581 Secondary hyperparathyroidism of renal origin: Secondary | ICD-10-CM | POA: Diagnosis not present

## 2016-01-17 DIAGNOSIS — E611 Iron deficiency: Secondary | ICD-10-CM | POA: Diagnosis not present

## 2016-01-17 DIAGNOSIS — N186 End stage renal disease: Secondary | ICD-10-CM | POA: Diagnosis not present

## 2016-01-17 DIAGNOSIS — D631 Anemia in chronic kidney disease: Secondary | ICD-10-CM | POA: Diagnosis not present

## 2016-01-20 DIAGNOSIS — N2581 Secondary hyperparathyroidism of renal origin: Secondary | ICD-10-CM | POA: Diagnosis not present

## 2016-01-20 DIAGNOSIS — E611 Iron deficiency: Secondary | ICD-10-CM | POA: Diagnosis not present

## 2016-01-20 DIAGNOSIS — Z992 Dependence on renal dialysis: Secondary | ICD-10-CM | POA: Diagnosis not present

## 2016-01-20 DIAGNOSIS — D631 Anemia in chronic kidney disease: Secondary | ICD-10-CM | POA: Diagnosis not present

## 2016-01-20 DIAGNOSIS — N186 End stage renal disease: Secondary | ICD-10-CM | POA: Diagnosis not present

## 2016-01-22 DIAGNOSIS — D631 Anemia in chronic kidney disease: Secondary | ICD-10-CM | POA: Diagnosis not present

## 2016-01-22 DIAGNOSIS — N186 End stage renal disease: Secondary | ICD-10-CM | POA: Diagnosis not present

## 2016-01-22 DIAGNOSIS — E611 Iron deficiency: Secondary | ICD-10-CM | POA: Diagnosis not present

## 2016-01-22 DIAGNOSIS — N2581 Secondary hyperparathyroidism of renal origin: Secondary | ICD-10-CM | POA: Diagnosis not present

## 2016-01-22 DIAGNOSIS — Z992 Dependence on renal dialysis: Secondary | ICD-10-CM | POA: Diagnosis not present

## 2016-01-24 DIAGNOSIS — L299 Pruritus, unspecified: Secondary | ICD-10-CM | POA: Diagnosis not present

## 2016-01-24 DIAGNOSIS — Z992 Dependence on renal dialysis: Secondary | ICD-10-CM | POA: Diagnosis not present

## 2016-01-24 DIAGNOSIS — N186 End stage renal disease: Secondary | ICD-10-CM | POA: Diagnosis not present

## 2016-01-24 DIAGNOSIS — D631 Anemia in chronic kidney disease: Secondary | ICD-10-CM | POA: Diagnosis not present

## 2016-01-24 DIAGNOSIS — Z23 Encounter for immunization: Secondary | ICD-10-CM | POA: Diagnosis not present

## 2016-01-24 DIAGNOSIS — N2581 Secondary hyperparathyroidism of renal origin: Secondary | ICD-10-CM | POA: Diagnosis not present

## 2016-01-24 DIAGNOSIS — E611 Iron deficiency: Secondary | ICD-10-CM | POA: Diagnosis not present

## 2016-01-27 DIAGNOSIS — E611 Iron deficiency: Secondary | ICD-10-CM | POA: Diagnosis not present

## 2016-01-27 DIAGNOSIS — D631 Anemia in chronic kidney disease: Secondary | ICD-10-CM | POA: Diagnosis not present

## 2016-01-27 DIAGNOSIS — L299 Pruritus, unspecified: Secondary | ICD-10-CM | POA: Diagnosis not present

## 2016-01-27 DIAGNOSIS — Z23 Encounter for immunization: Secondary | ICD-10-CM | POA: Diagnosis not present

## 2016-01-27 DIAGNOSIS — N186 End stage renal disease: Secondary | ICD-10-CM | POA: Diagnosis not present

## 2016-01-27 DIAGNOSIS — N2581 Secondary hyperparathyroidism of renal origin: Secondary | ICD-10-CM | POA: Diagnosis not present

## 2016-01-29 DIAGNOSIS — D631 Anemia in chronic kidney disease: Secondary | ICD-10-CM | POA: Diagnosis not present

## 2016-01-29 DIAGNOSIS — E611 Iron deficiency: Secondary | ICD-10-CM | POA: Diagnosis not present

## 2016-01-29 DIAGNOSIS — L299 Pruritus, unspecified: Secondary | ICD-10-CM | POA: Diagnosis not present

## 2016-01-29 DIAGNOSIS — N186 End stage renal disease: Secondary | ICD-10-CM | POA: Diagnosis not present

## 2016-01-29 DIAGNOSIS — N2581 Secondary hyperparathyroidism of renal origin: Secondary | ICD-10-CM | POA: Diagnosis not present

## 2016-01-29 DIAGNOSIS — Z23 Encounter for immunization: Secondary | ICD-10-CM | POA: Diagnosis not present

## 2016-01-31 DIAGNOSIS — D631 Anemia in chronic kidney disease: Secondary | ICD-10-CM | POA: Diagnosis not present

## 2016-01-31 DIAGNOSIS — E611 Iron deficiency: Secondary | ICD-10-CM | POA: Diagnosis not present

## 2016-01-31 DIAGNOSIS — Z23 Encounter for immunization: Secondary | ICD-10-CM | POA: Diagnosis not present

## 2016-01-31 DIAGNOSIS — N2581 Secondary hyperparathyroidism of renal origin: Secondary | ICD-10-CM | POA: Diagnosis not present

## 2016-01-31 DIAGNOSIS — N186 End stage renal disease: Secondary | ICD-10-CM | POA: Diagnosis not present

## 2016-01-31 DIAGNOSIS — L299 Pruritus, unspecified: Secondary | ICD-10-CM | POA: Diagnosis not present

## 2016-02-02 DIAGNOSIS — D631 Anemia in chronic kidney disease: Secondary | ICD-10-CM | POA: Diagnosis not present

## 2016-02-02 DIAGNOSIS — Z23 Encounter for immunization: Secondary | ICD-10-CM | POA: Diagnosis not present

## 2016-02-02 DIAGNOSIS — N186 End stage renal disease: Secondary | ICD-10-CM | POA: Diagnosis not present

## 2016-02-02 DIAGNOSIS — L299 Pruritus, unspecified: Secondary | ICD-10-CM | POA: Diagnosis not present

## 2016-02-02 DIAGNOSIS — N2581 Secondary hyperparathyroidism of renal origin: Secondary | ICD-10-CM | POA: Diagnosis not present

## 2016-02-02 DIAGNOSIS — E611 Iron deficiency: Secondary | ICD-10-CM | POA: Diagnosis not present

## 2016-02-05 DIAGNOSIS — N186 End stage renal disease: Secondary | ICD-10-CM | POA: Diagnosis not present

## 2016-02-05 DIAGNOSIS — L299 Pruritus, unspecified: Secondary | ICD-10-CM | POA: Diagnosis not present

## 2016-02-05 DIAGNOSIS — D631 Anemia in chronic kidney disease: Secondary | ICD-10-CM | POA: Diagnosis not present

## 2016-02-05 DIAGNOSIS — Z23 Encounter for immunization: Secondary | ICD-10-CM | POA: Diagnosis not present

## 2016-02-05 DIAGNOSIS — N2581 Secondary hyperparathyroidism of renal origin: Secondary | ICD-10-CM | POA: Diagnosis not present

## 2016-02-05 DIAGNOSIS — E611 Iron deficiency: Secondary | ICD-10-CM | POA: Diagnosis not present

## 2016-02-07 DIAGNOSIS — Z23 Encounter for immunization: Secondary | ICD-10-CM | POA: Diagnosis not present

## 2016-02-07 DIAGNOSIS — E611 Iron deficiency: Secondary | ICD-10-CM | POA: Diagnosis not present

## 2016-02-07 DIAGNOSIS — N2581 Secondary hyperparathyroidism of renal origin: Secondary | ICD-10-CM | POA: Diagnosis not present

## 2016-02-07 DIAGNOSIS — D631 Anemia in chronic kidney disease: Secondary | ICD-10-CM | POA: Diagnosis not present

## 2016-02-07 DIAGNOSIS — L299 Pruritus, unspecified: Secondary | ICD-10-CM | POA: Diagnosis not present

## 2016-02-07 DIAGNOSIS — N186 End stage renal disease: Secondary | ICD-10-CM | POA: Diagnosis not present

## 2016-02-10 DIAGNOSIS — N186 End stage renal disease: Secondary | ICD-10-CM | POA: Diagnosis not present

## 2016-02-10 DIAGNOSIS — E611 Iron deficiency: Secondary | ICD-10-CM | POA: Diagnosis not present

## 2016-02-10 DIAGNOSIS — Z23 Encounter for immunization: Secondary | ICD-10-CM | POA: Diagnosis not present

## 2016-02-10 DIAGNOSIS — D631 Anemia in chronic kidney disease: Secondary | ICD-10-CM | POA: Diagnosis not present

## 2016-02-10 DIAGNOSIS — N2581 Secondary hyperparathyroidism of renal origin: Secondary | ICD-10-CM | POA: Diagnosis not present

## 2016-02-10 DIAGNOSIS — L299 Pruritus, unspecified: Secondary | ICD-10-CM | POA: Diagnosis not present

## 2016-02-12 DIAGNOSIS — E611 Iron deficiency: Secondary | ICD-10-CM | POA: Diagnosis not present

## 2016-02-12 DIAGNOSIS — N2581 Secondary hyperparathyroidism of renal origin: Secondary | ICD-10-CM | POA: Diagnosis not present

## 2016-02-12 DIAGNOSIS — N186 End stage renal disease: Secondary | ICD-10-CM | POA: Diagnosis not present

## 2016-02-12 DIAGNOSIS — L299 Pruritus, unspecified: Secondary | ICD-10-CM | POA: Diagnosis not present

## 2016-02-12 DIAGNOSIS — Z23 Encounter for immunization: Secondary | ICD-10-CM | POA: Diagnosis not present

## 2016-02-12 DIAGNOSIS — D631 Anemia in chronic kidney disease: Secondary | ICD-10-CM | POA: Diagnosis not present

## 2016-02-14 DIAGNOSIS — L299 Pruritus, unspecified: Secondary | ICD-10-CM | POA: Diagnosis not present

## 2016-02-14 DIAGNOSIS — Z23 Encounter for immunization: Secondary | ICD-10-CM | POA: Diagnosis not present

## 2016-02-14 DIAGNOSIS — N2581 Secondary hyperparathyroidism of renal origin: Secondary | ICD-10-CM | POA: Diagnosis not present

## 2016-02-14 DIAGNOSIS — N186 End stage renal disease: Secondary | ICD-10-CM | POA: Diagnosis not present

## 2016-02-14 DIAGNOSIS — E611 Iron deficiency: Secondary | ICD-10-CM | POA: Diagnosis not present

## 2016-02-14 DIAGNOSIS — D631 Anemia in chronic kidney disease: Secondary | ICD-10-CM | POA: Diagnosis not present

## 2016-02-17 DIAGNOSIS — N2581 Secondary hyperparathyroidism of renal origin: Secondary | ICD-10-CM | POA: Diagnosis not present

## 2016-02-17 DIAGNOSIS — L299 Pruritus, unspecified: Secondary | ICD-10-CM | POA: Diagnosis not present

## 2016-02-17 DIAGNOSIS — D631 Anemia in chronic kidney disease: Secondary | ICD-10-CM | POA: Diagnosis not present

## 2016-02-17 DIAGNOSIS — E611 Iron deficiency: Secondary | ICD-10-CM | POA: Diagnosis not present

## 2016-02-17 DIAGNOSIS — N186 End stage renal disease: Secondary | ICD-10-CM | POA: Diagnosis not present

## 2016-02-17 DIAGNOSIS — Z23 Encounter for immunization: Secondary | ICD-10-CM | POA: Diagnosis not present

## 2016-02-19 DIAGNOSIS — E611 Iron deficiency: Secondary | ICD-10-CM | POA: Diagnosis not present

## 2016-02-19 DIAGNOSIS — Z23 Encounter for immunization: Secondary | ICD-10-CM | POA: Diagnosis not present

## 2016-02-19 DIAGNOSIS — L299 Pruritus, unspecified: Secondary | ICD-10-CM | POA: Diagnosis not present

## 2016-02-19 DIAGNOSIS — D631 Anemia in chronic kidney disease: Secondary | ICD-10-CM | POA: Diagnosis not present

## 2016-02-19 DIAGNOSIS — N186 End stage renal disease: Secondary | ICD-10-CM | POA: Diagnosis not present

## 2016-02-19 DIAGNOSIS — N2581 Secondary hyperparathyroidism of renal origin: Secondary | ICD-10-CM | POA: Diagnosis not present

## 2016-02-21 DIAGNOSIS — Z992 Dependence on renal dialysis: Secondary | ICD-10-CM | POA: Diagnosis not present

## 2016-02-21 DIAGNOSIS — Z23 Encounter for immunization: Secondary | ICD-10-CM | POA: Diagnosis not present

## 2016-02-21 DIAGNOSIS — L299 Pruritus, unspecified: Secondary | ICD-10-CM | POA: Diagnosis not present

## 2016-02-21 DIAGNOSIS — E611 Iron deficiency: Secondary | ICD-10-CM | POA: Diagnosis not present

## 2016-02-21 DIAGNOSIS — N186 End stage renal disease: Secondary | ICD-10-CM | POA: Diagnosis not present

## 2016-02-21 DIAGNOSIS — N2581 Secondary hyperparathyroidism of renal origin: Secondary | ICD-10-CM | POA: Diagnosis not present

## 2016-02-21 DIAGNOSIS — D631 Anemia in chronic kidney disease: Secondary | ICD-10-CM | POA: Diagnosis not present

## 2016-02-24 DIAGNOSIS — D509 Iron deficiency anemia, unspecified: Secondary | ICD-10-CM | POA: Diagnosis not present

## 2016-02-24 DIAGNOSIS — N2581 Secondary hyperparathyroidism of renal origin: Secondary | ICD-10-CM | POA: Diagnosis not present

## 2016-02-24 DIAGNOSIS — Z992 Dependence on renal dialysis: Secondary | ICD-10-CM | POA: Diagnosis not present

## 2016-02-24 DIAGNOSIS — L299 Pruritus, unspecified: Secondary | ICD-10-CM | POA: Diagnosis not present

## 2016-02-24 DIAGNOSIS — N186 End stage renal disease: Secondary | ICD-10-CM | POA: Diagnosis not present

## 2016-02-24 DIAGNOSIS — D631 Anemia in chronic kidney disease: Secondary | ICD-10-CM | POA: Diagnosis not present

## 2016-02-24 DIAGNOSIS — E611 Iron deficiency: Secondary | ICD-10-CM | POA: Diagnosis not present

## 2016-02-26 DIAGNOSIS — D509 Iron deficiency anemia, unspecified: Secondary | ICD-10-CM | POA: Diagnosis not present

## 2016-02-26 DIAGNOSIS — E611 Iron deficiency: Secondary | ICD-10-CM | POA: Diagnosis not present

## 2016-02-26 DIAGNOSIS — N186 End stage renal disease: Secondary | ICD-10-CM | POA: Diagnosis not present

## 2016-02-26 DIAGNOSIS — L299 Pruritus, unspecified: Secondary | ICD-10-CM | POA: Diagnosis not present

## 2016-02-26 DIAGNOSIS — D631 Anemia in chronic kidney disease: Secondary | ICD-10-CM | POA: Diagnosis not present

## 2016-02-26 DIAGNOSIS — N2581 Secondary hyperparathyroidism of renal origin: Secondary | ICD-10-CM | POA: Diagnosis not present

## 2016-02-28 DIAGNOSIS — E611 Iron deficiency: Secondary | ICD-10-CM | POA: Diagnosis not present

## 2016-02-28 DIAGNOSIS — N186 End stage renal disease: Secondary | ICD-10-CM | POA: Diagnosis not present

## 2016-02-28 DIAGNOSIS — N2581 Secondary hyperparathyroidism of renal origin: Secondary | ICD-10-CM | POA: Diagnosis not present

## 2016-02-28 DIAGNOSIS — L299 Pruritus, unspecified: Secondary | ICD-10-CM | POA: Diagnosis not present

## 2016-02-28 DIAGNOSIS — D631 Anemia in chronic kidney disease: Secondary | ICD-10-CM | POA: Diagnosis not present

## 2016-02-28 DIAGNOSIS — D509 Iron deficiency anemia, unspecified: Secondary | ICD-10-CM | POA: Diagnosis not present

## 2016-03-02 DIAGNOSIS — D509 Iron deficiency anemia, unspecified: Secondary | ICD-10-CM | POA: Diagnosis not present

## 2016-03-02 DIAGNOSIS — L299 Pruritus, unspecified: Secondary | ICD-10-CM | POA: Diagnosis not present

## 2016-03-02 DIAGNOSIS — E611 Iron deficiency: Secondary | ICD-10-CM | POA: Diagnosis not present

## 2016-03-02 DIAGNOSIS — N186 End stage renal disease: Secondary | ICD-10-CM | POA: Diagnosis not present

## 2016-03-02 DIAGNOSIS — N2581 Secondary hyperparathyroidism of renal origin: Secondary | ICD-10-CM | POA: Diagnosis not present

## 2016-03-02 DIAGNOSIS — D631 Anemia in chronic kidney disease: Secondary | ICD-10-CM | POA: Diagnosis not present

## 2016-03-04 DIAGNOSIS — E611 Iron deficiency: Secondary | ICD-10-CM | POA: Diagnosis not present

## 2016-03-04 DIAGNOSIS — N186 End stage renal disease: Secondary | ICD-10-CM | POA: Diagnosis not present

## 2016-03-04 DIAGNOSIS — D631 Anemia in chronic kidney disease: Secondary | ICD-10-CM | POA: Diagnosis not present

## 2016-03-04 DIAGNOSIS — L299 Pruritus, unspecified: Secondary | ICD-10-CM | POA: Diagnosis not present

## 2016-03-04 DIAGNOSIS — N2581 Secondary hyperparathyroidism of renal origin: Secondary | ICD-10-CM | POA: Diagnosis not present

## 2016-03-04 DIAGNOSIS — D509 Iron deficiency anemia, unspecified: Secondary | ICD-10-CM | POA: Diagnosis not present

## 2016-03-06 DIAGNOSIS — L299 Pruritus, unspecified: Secondary | ICD-10-CM | POA: Diagnosis not present

## 2016-03-06 DIAGNOSIS — D509 Iron deficiency anemia, unspecified: Secondary | ICD-10-CM | POA: Diagnosis not present

## 2016-03-06 DIAGNOSIS — N2581 Secondary hyperparathyroidism of renal origin: Secondary | ICD-10-CM | POA: Diagnosis not present

## 2016-03-06 DIAGNOSIS — E611 Iron deficiency: Secondary | ICD-10-CM | POA: Diagnosis not present

## 2016-03-06 DIAGNOSIS — D631 Anemia in chronic kidney disease: Secondary | ICD-10-CM | POA: Diagnosis not present

## 2016-03-06 DIAGNOSIS — N186 End stage renal disease: Secondary | ICD-10-CM | POA: Diagnosis not present

## 2016-03-09 DIAGNOSIS — E611 Iron deficiency: Secondary | ICD-10-CM | POA: Diagnosis not present

## 2016-03-09 DIAGNOSIS — L299 Pruritus, unspecified: Secondary | ICD-10-CM | POA: Diagnosis not present

## 2016-03-09 DIAGNOSIS — N2581 Secondary hyperparathyroidism of renal origin: Secondary | ICD-10-CM | POA: Diagnosis not present

## 2016-03-09 DIAGNOSIS — D631 Anemia in chronic kidney disease: Secondary | ICD-10-CM | POA: Diagnosis not present

## 2016-03-09 DIAGNOSIS — D509 Iron deficiency anemia, unspecified: Secondary | ICD-10-CM | POA: Diagnosis not present

## 2016-03-09 DIAGNOSIS — N186 End stage renal disease: Secondary | ICD-10-CM | POA: Diagnosis not present

## 2016-03-11 DIAGNOSIS — E611 Iron deficiency: Secondary | ICD-10-CM | POA: Diagnosis not present

## 2016-03-11 DIAGNOSIS — L299 Pruritus, unspecified: Secondary | ICD-10-CM | POA: Diagnosis not present

## 2016-03-11 DIAGNOSIS — N186 End stage renal disease: Secondary | ICD-10-CM | POA: Diagnosis not present

## 2016-03-11 DIAGNOSIS — D509 Iron deficiency anemia, unspecified: Secondary | ICD-10-CM | POA: Diagnosis not present

## 2016-03-11 DIAGNOSIS — D631 Anemia in chronic kidney disease: Secondary | ICD-10-CM | POA: Diagnosis not present

## 2016-03-11 DIAGNOSIS — N2581 Secondary hyperparathyroidism of renal origin: Secondary | ICD-10-CM | POA: Diagnosis not present

## 2016-03-13 DIAGNOSIS — N186 End stage renal disease: Secondary | ICD-10-CM | POA: Diagnosis not present

## 2016-03-13 DIAGNOSIS — E611 Iron deficiency: Secondary | ICD-10-CM | POA: Diagnosis not present

## 2016-03-13 DIAGNOSIS — D509 Iron deficiency anemia, unspecified: Secondary | ICD-10-CM | POA: Diagnosis not present

## 2016-03-13 DIAGNOSIS — D631 Anemia in chronic kidney disease: Secondary | ICD-10-CM | POA: Diagnosis not present

## 2016-03-13 DIAGNOSIS — L299 Pruritus, unspecified: Secondary | ICD-10-CM | POA: Diagnosis not present

## 2016-03-13 DIAGNOSIS — N2581 Secondary hyperparathyroidism of renal origin: Secondary | ICD-10-CM | POA: Diagnosis not present

## 2016-03-16 DIAGNOSIS — N186 End stage renal disease: Secondary | ICD-10-CM | POA: Diagnosis not present

## 2016-03-16 DIAGNOSIS — E611 Iron deficiency: Secondary | ICD-10-CM | POA: Diagnosis not present

## 2016-03-16 DIAGNOSIS — N2581 Secondary hyperparathyroidism of renal origin: Secondary | ICD-10-CM | POA: Diagnosis not present

## 2016-03-16 DIAGNOSIS — D509 Iron deficiency anemia, unspecified: Secondary | ICD-10-CM | POA: Diagnosis not present

## 2016-03-16 DIAGNOSIS — L299 Pruritus, unspecified: Secondary | ICD-10-CM | POA: Diagnosis not present

## 2016-03-16 DIAGNOSIS — D631 Anemia in chronic kidney disease: Secondary | ICD-10-CM | POA: Diagnosis not present

## 2016-03-18 DIAGNOSIS — D631 Anemia in chronic kidney disease: Secondary | ICD-10-CM | POA: Diagnosis not present

## 2016-03-18 DIAGNOSIS — D509 Iron deficiency anemia, unspecified: Secondary | ICD-10-CM | POA: Diagnosis not present

## 2016-03-18 DIAGNOSIS — L299 Pruritus, unspecified: Secondary | ICD-10-CM | POA: Diagnosis not present

## 2016-03-18 DIAGNOSIS — N186 End stage renal disease: Secondary | ICD-10-CM | POA: Diagnosis not present

## 2016-03-18 DIAGNOSIS — E611 Iron deficiency: Secondary | ICD-10-CM | POA: Diagnosis not present

## 2016-03-18 DIAGNOSIS — N2581 Secondary hyperparathyroidism of renal origin: Secondary | ICD-10-CM | POA: Diagnosis not present

## 2016-03-20 DIAGNOSIS — N2581 Secondary hyperparathyroidism of renal origin: Secondary | ICD-10-CM | POA: Diagnosis not present

## 2016-03-20 DIAGNOSIS — L299 Pruritus, unspecified: Secondary | ICD-10-CM | POA: Diagnosis not present

## 2016-03-20 DIAGNOSIS — N186 End stage renal disease: Secondary | ICD-10-CM | POA: Diagnosis not present

## 2016-03-20 DIAGNOSIS — D509 Iron deficiency anemia, unspecified: Secondary | ICD-10-CM | POA: Diagnosis not present

## 2016-03-20 DIAGNOSIS — D631 Anemia in chronic kidney disease: Secondary | ICD-10-CM | POA: Diagnosis not present

## 2016-03-20 DIAGNOSIS — E611 Iron deficiency: Secondary | ICD-10-CM | POA: Diagnosis not present

## 2016-03-23 DIAGNOSIS — L299 Pruritus, unspecified: Secondary | ICD-10-CM | POA: Diagnosis not present

## 2016-03-23 DIAGNOSIS — D631 Anemia in chronic kidney disease: Secondary | ICD-10-CM | POA: Diagnosis not present

## 2016-03-23 DIAGNOSIS — N186 End stage renal disease: Secondary | ICD-10-CM | POA: Diagnosis not present

## 2016-03-23 DIAGNOSIS — D509 Iron deficiency anemia, unspecified: Secondary | ICD-10-CM | POA: Diagnosis not present

## 2016-03-23 DIAGNOSIS — N2581 Secondary hyperparathyroidism of renal origin: Secondary | ICD-10-CM | POA: Diagnosis not present

## 2016-03-23 DIAGNOSIS — E611 Iron deficiency: Secondary | ICD-10-CM | POA: Diagnosis not present

## 2016-03-23 DIAGNOSIS — Z992 Dependence on renal dialysis: Secondary | ICD-10-CM | POA: Diagnosis not present

## 2016-03-25 DIAGNOSIS — E611 Iron deficiency: Secondary | ICD-10-CM | POA: Diagnosis not present

## 2016-03-25 DIAGNOSIS — N186 End stage renal disease: Secondary | ICD-10-CM | POA: Diagnosis not present

## 2016-03-25 DIAGNOSIS — N2581 Secondary hyperparathyroidism of renal origin: Secondary | ICD-10-CM | POA: Diagnosis not present

## 2016-03-25 DIAGNOSIS — Z992 Dependence on renal dialysis: Secondary | ICD-10-CM | POA: Diagnosis not present

## 2016-03-25 DIAGNOSIS — L299 Pruritus, unspecified: Secondary | ICD-10-CM | POA: Diagnosis not present

## 2016-03-25 DIAGNOSIS — D631 Anemia in chronic kidney disease: Secondary | ICD-10-CM | POA: Diagnosis not present

## 2016-03-27 DIAGNOSIS — N186 End stage renal disease: Secondary | ICD-10-CM | POA: Diagnosis not present

## 2016-03-27 DIAGNOSIS — L299 Pruritus, unspecified: Secondary | ICD-10-CM | POA: Diagnosis not present

## 2016-03-27 DIAGNOSIS — N2581 Secondary hyperparathyroidism of renal origin: Secondary | ICD-10-CM | POA: Diagnosis not present

## 2016-03-27 DIAGNOSIS — Z992 Dependence on renal dialysis: Secondary | ICD-10-CM | POA: Diagnosis not present

## 2016-03-27 DIAGNOSIS — D631 Anemia in chronic kidney disease: Secondary | ICD-10-CM | POA: Diagnosis not present

## 2016-03-27 DIAGNOSIS — E611 Iron deficiency: Secondary | ICD-10-CM | POA: Diagnosis not present

## 2016-03-30 DIAGNOSIS — Z992 Dependence on renal dialysis: Secondary | ICD-10-CM | POA: Diagnosis not present

## 2016-03-30 DIAGNOSIS — N2581 Secondary hyperparathyroidism of renal origin: Secondary | ICD-10-CM | POA: Diagnosis not present

## 2016-03-30 DIAGNOSIS — N186 End stage renal disease: Secondary | ICD-10-CM | POA: Diagnosis not present

## 2016-03-30 DIAGNOSIS — E611 Iron deficiency: Secondary | ICD-10-CM | POA: Diagnosis not present

## 2016-03-30 DIAGNOSIS — L299 Pruritus, unspecified: Secondary | ICD-10-CM | POA: Diagnosis not present

## 2016-03-30 DIAGNOSIS — D631 Anemia in chronic kidney disease: Secondary | ICD-10-CM | POA: Diagnosis not present

## 2016-04-01 DIAGNOSIS — N186 End stage renal disease: Secondary | ICD-10-CM | POA: Diagnosis not present

## 2016-04-01 DIAGNOSIS — L299 Pruritus, unspecified: Secondary | ICD-10-CM | POA: Diagnosis not present

## 2016-04-01 DIAGNOSIS — E611 Iron deficiency: Secondary | ICD-10-CM | POA: Diagnosis not present

## 2016-04-01 DIAGNOSIS — N2581 Secondary hyperparathyroidism of renal origin: Secondary | ICD-10-CM | POA: Diagnosis not present

## 2016-04-01 DIAGNOSIS — Z992 Dependence on renal dialysis: Secondary | ICD-10-CM | POA: Diagnosis not present

## 2016-04-01 DIAGNOSIS — D631 Anemia in chronic kidney disease: Secondary | ICD-10-CM | POA: Diagnosis not present

## 2016-04-03 DIAGNOSIS — Z992 Dependence on renal dialysis: Secondary | ICD-10-CM | POA: Diagnosis not present

## 2016-04-03 DIAGNOSIS — N186 End stage renal disease: Secondary | ICD-10-CM | POA: Diagnosis not present

## 2016-04-03 DIAGNOSIS — N2581 Secondary hyperparathyroidism of renal origin: Secondary | ICD-10-CM | POA: Diagnosis not present

## 2016-04-03 DIAGNOSIS — E611 Iron deficiency: Secondary | ICD-10-CM | POA: Diagnosis not present

## 2016-04-03 DIAGNOSIS — D631 Anemia in chronic kidney disease: Secondary | ICD-10-CM | POA: Diagnosis not present

## 2016-04-03 DIAGNOSIS — L299 Pruritus, unspecified: Secondary | ICD-10-CM | POA: Diagnosis not present

## 2016-04-06 DIAGNOSIS — L299 Pruritus, unspecified: Secondary | ICD-10-CM | POA: Diagnosis not present

## 2016-04-06 DIAGNOSIS — N2581 Secondary hyperparathyroidism of renal origin: Secondary | ICD-10-CM | POA: Diagnosis not present

## 2016-04-06 DIAGNOSIS — N186 End stage renal disease: Secondary | ICD-10-CM | POA: Diagnosis not present

## 2016-04-06 DIAGNOSIS — E611 Iron deficiency: Secondary | ICD-10-CM | POA: Diagnosis not present

## 2016-04-06 DIAGNOSIS — D631 Anemia in chronic kidney disease: Secondary | ICD-10-CM | POA: Diagnosis not present

## 2016-04-06 DIAGNOSIS — Z992 Dependence on renal dialysis: Secondary | ICD-10-CM | POA: Diagnosis not present

## 2016-04-08 DIAGNOSIS — N186 End stage renal disease: Secondary | ICD-10-CM | POA: Diagnosis not present

## 2016-04-08 DIAGNOSIS — N2581 Secondary hyperparathyroidism of renal origin: Secondary | ICD-10-CM | POA: Diagnosis not present

## 2016-04-08 DIAGNOSIS — E611 Iron deficiency: Secondary | ICD-10-CM | POA: Diagnosis not present

## 2016-04-08 DIAGNOSIS — Z992 Dependence on renal dialysis: Secondary | ICD-10-CM | POA: Diagnosis not present

## 2016-04-08 DIAGNOSIS — L299 Pruritus, unspecified: Secondary | ICD-10-CM | POA: Diagnosis not present

## 2016-04-08 DIAGNOSIS — D631 Anemia in chronic kidney disease: Secondary | ICD-10-CM | POA: Diagnosis not present

## 2016-04-10 DIAGNOSIS — D631 Anemia in chronic kidney disease: Secondary | ICD-10-CM | POA: Diagnosis not present

## 2016-04-10 DIAGNOSIS — Z992 Dependence on renal dialysis: Secondary | ICD-10-CM | POA: Diagnosis not present

## 2016-04-10 DIAGNOSIS — E611 Iron deficiency: Secondary | ICD-10-CM | POA: Diagnosis not present

## 2016-04-10 DIAGNOSIS — L299 Pruritus, unspecified: Secondary | ICD-10-CM | POA: Diagnosis not present

## 2016-04-10 DIAGNOSIS — N2581 Secondary hyperparathyroidism of renal origin: Secondary | ICD-10-CM | POA: Diagnosis not present

## 2016-04-10 DIAGNOSIS — N186 End stage renal disease: Secondary | ICD-10-CM | POA: Diagnosis not present

## 2016-04-13 DIAGNOSIS — Z992 Dependence on renal dialysis: Secondary | ICD-10-CM | POA: Diagnosis not present

## 2016-04-13 DIAGNOSIS — N2581 Secondary hyperparathyroidism of renal origin: Secondary | ICD-10-CM | POA: Diagnosis not present

## 2016-04-13 DIAGNOSIS — N186 End stage renal disease: Secondary | ICD-10-CM | POA: Diagnosis not present

## 2016-04-13 DIAGNOSIS — D631 Anemia in chronic kidney disease: Secondary | ICD-10-CM | POA: Diagnosis not present

## 2016-04-13 DIAGNOSIS — E611 Iron deficiency: Secondary | ICD-10-CM | POA: Diagnosis not present

## 2016-04-13 DIAGNOSIS — L299 Pruritus, unspecified: Secondary | ICD-10-CM | POA: Diagnosis not present

## 2016-04-15 DIAGNOSIS — E611 Iron deficiency: Secondary | ICD-10-CM | POA: Diagnosis not present

## 2016-04-15 DIAGNOSIS — N2581 Secondary hyperparathyroidism of renal origin: Secondary | ICD-10-CM | POA: Diagnosis not present

## 2016-04-15 DIAGNOSIS — L299 Pruritus, unspecified: Secondary | ICD-10-CM | POA: Diagnosis not present

## 2016-04-15 DIAGNOSIS — N186 End stage renal disease: Secondary | ICD-10-CM | POA: Diagnosis not present

## 2016-04-15 DIAGNOSIS — Z992 Dependence on renal dialysis: Secondary | ICD-10-CM | POA: Diagnosis not present

## 2016-04-15 DIAGNOSIS — D631 Anemia in chronic kidney disease: Secondary | ICD-10-CM | POA: Diagnosis not present

## 2016-04-17 DIAGNOSIS — E611 Iron deficiency: Secondary | ICD-10-CM | POA: Diagnosis not present

## 2016-04-17 DIAGNOSIS — N186 End stage renal disease: Secondary | ICD-10-CM | POA: Diagnosis not present

## 2016-04-17 DIAGNOSIS — Z992 Dependence on renal dialysis: Secondary | ICD-10-CM | POA: Diagnosis not present

## 2016-04-17 DIAGNOSIS — D631 Anemia in chronic kidney disease: Secondary | ICD-10-CM | POA: Diagnosis not present

## 2016-04-17 DIAGNOSIS — N2581 Secondary hyperparathyroidism of renal origin: Secondary | ICD-10-CM | POA: Diagnosis not present

## 2016-04-17 DIAGNOSIS — L299 Pruritus, unspecified: Secondary | ICD-10-CM | POA: Diagnosis not present

## 2016-04-20 DIAGNOSIS — N2581 Secondary hyperparathyroidism of renal origin: Secondary | ICD-10-CM | POA: Diagnosis not present

## 2016-04-20 DIAGNOSIS — L299 Pruritus, unspecified: Secondary | ICD-10-CM | POA: Diagnosis not present

## 2016-04-20 DIAGNOSIS — N186 End stage renal disease: Secondary | ICD-10-CM | POA: Diagnosis not present

## 2016-04-20 DIAGNOSIS — D631 Anemia in chronic kidney disease: Secondary | ICD-10-CM | POA: Diagnosis not present

## 2016-04-20 DIAGNOSIS — Z992 Dependence on renal dialysis: Secondary | ICD-10-CM | POA: Diagnosis not present

## 2016-04-20 DIAGNOSIS — E611 Iron deficiency: Secondary | ICD-10-CM | POA: Diagnosis not present

## 2016-04-22 DIAGNOSIS — L299 Pruritus, unspecified: Secondary | ICD-10-CM | POA: Diagnosis not present

## 2016-04-22 DIAGNOSIS — N186 End stage renal disease: Secondary | ICD-10-CM | POA: Diagnosis not present

## 2016-04-22 DIAGNOSIS — Z992 Dependence on renal dialysis: Secondary | ICD-10-CM | POA: Diagnosis not present

## 2016-04-22 DIAGNOSIS — E611 Iron deficiency: Secondary | ICD-10-CM | POA: Diagnosis not present

## 2016-04-22 DIAGNOSIS — N2581 Secondary hyperparathyroidism of renal origin: Secondary | ICD-10-CM | POA: Diagnosis not present

## 2016-04-22 DIAGNOSIS — D631 Anemia in chronic kidney disease: Secondary | ICD-10-CM | POA: Diagnosis not present

## 2016-04-24 DIAGNOSIS — N186 End stage renal disease: Secondary | ICD-10-CM | POA: Diagnosis not present

## 2016-04-24 DIAGNOSIS — N2581 Secondary hyperparathyroidism of renal origin: Secondary | ICD-10-CM | POA: Diagnosis not present

## 2016-04-24 DIAGNOSIS — L299 Pruritus, unspecified: Secondary | ICD-10-CM | POA: Diagnosis not present

## 2016-04-24 DIAGNOSIS — D631 Anemia in chronic kidney disease: Secondary | ICD-10-CM | POA: Diagnosis not present

## 2016-04-24 DIAGNOSIS — E611 Iron deficiency: Secondary | ICD-10-CM | POA: Diagnosis not present

## 2016-04-24 DIAGNOSIS — Z992 Dependence on renal dialysis: Secondary | ICD-10-CM | POA: Diagnosis not present

## 2016-04-27 DIAGNOSIS — L299 Pruritus, unspecified: Secondary | ICD-10-CM | POA: Diagnosis not present

## 2016-04-27 DIAGNOSIS — E611 Iron deficiency: Secondary | ICD-10-CM | POA: Diagnosis not present

## 2016-04-27 DIAGNOSIS — Z992 Dependence on renal dialysis: Secondary | ICD-10-CM | POA: Diagnosis not present

## 2016-04-27 DIAGNOSIS — N2581 Secondary hyperparathyroidism of renal origin: Secondary | ICD-10-CM | POA: Diagnosis not present

## 2016-04-27 DIAGNOSIS — D631 Anemia in chronic kidney disease: Secondary | ICD-10-CM | POA: Diagnosis not present

## 2016-04-27 DIAGNOSIS — N186 End stage renal disease: Secondary | ICD-10-CM | POA: Diagnosis not present

## 2016-04-29 DIAGNOSIS — L299 Pruritus, unspecified: Secondary | ICD-10-CM | POA: Diagnosis not present

## 2016-04-29 DIAGNOSIS — D631 Anemia in chronic kidney disease: Secondary | ICD-10-CM | POA: Diagnosis not present

## 2016-04-29 DIAGNOSIS — E611 Iron deficiency: Secondary | ICD-10-CM | POA: Diagnosis not present

## 2016-04-29 DIAGNOSIS — Z992 Dependence on renal dialysis: Secondary | ICD-10-CM | POA: Diagnosis not present

## 2016-04-29 DIAGNOSIS — N186 End stage renal disease: Secondary | ICD-10-CM | POA: Diagnosis not present

## 2016-04-29 DIAGNOSIS — N2581 Secondary hyperparathyroidism of renal origin: Secondary | ICD-10-CM | POA: Diagnosis not present

## 2016-05-01 DIAGNOSIS — D631 Anemia in chronic kidney disease: Secondary | ICD-10-CM | POA: Diagnosis not present

## 2016-05-01 DIAGNOSIS — E611 Iron deficiency: Secondary | ICD-10-CM | POA: Diagnosis not present

## 2016-05-01 DIAGNOSIS — N2581 Secondary hyperparathyroidism of renal origin: Secondary | ICD-10-CM | POA: Diagnosis not present

## 2016-05-01 DIAGNOSIS — L299 Pruritus, unspecified: Secondary | ICD-10-CM | POA: Diagnosis not present

## 2016-05-01 DIAGNOSIS — Z992 Dependence on renal dialysis: Secondary | ICD-10-CM | POA: Diagnosis not present

## 2016-05-01 DIAGNOSIS — N186 End stage renal disease: Secondary | ICD-10-CM | POA: Diagnosis not present

## 2016-05-04 DIAGNOSIS — L299 Pruritus, unspecified: Secondary | ICD-10-CM | POA: Diagnosis not present

## 2016-05-04 DIAGNOSIS — D631 Anemia in chronic kidney disease: Secondary | ICD-10-CM | POA: Diagnosis not present

## 2016-05-04 DIAGNOSIS — N186 End stage renal disease: Secondary | ICD-10-CM | POA: Diagnosis not present

## 2016-05-04 DIAGNOSIS — Z992 Dependence on renal dialysis: Secondary | ICD-10-CM | POA: Diagnosis not present

## 2016-05-04 DIAGNOSIS — E611 Iron deficiency: Secondary | ICD-10-CM | POA: Diagnosis not present

## 2016-05-04 DIAGNOSIS — N2581 Secondary hyperparathyroidism of renal origin: Secondary | ICD-10-CM | POA: Diagnosis not present

## 2016-05-06 DIAGNOSIS — Z992 Dependence on renal dialysis: Secondary | ICD-10-CM | POA: Diagnosis not present

## 2016-05-06 DIAGNOSIS — N2581 Secondary hyperparathyroidism of renal origin: Secondary | ICD-10-CM | POA: Diagnosis not present

## 2016-05-06 DIAGNOSIS — D631 Anemia in chronic kidney disease: Secondary | ICD-10-CM | POA: Diagnosis not present

## 2016-05-06 DIAGNOSIS — N186 End stage renal disease: Secondary | ICD-10-CM | POA: Diagnosis not present

## 2016-05-06 DIAGNOSIS — E611 Iron deficiency: Secondary | ICD-10-CM | POA: Diagnosis not present

## 2016-05-06 DIAGNOSIS — L299 Pruritus, unspecified: Secondary | ICD-10-CM | POA: Diagnosis not present

## 2016-05-08 DIAGNOSIS — N186 End stage renal disease: Secondary | ICD-10-CM | POA: Diagnosis not present

## 2016-05-08 DIAGNOSIS — N2581 Secondary hyperparathyroidism of renal origin: Secondary | ICD-10-CM | POA: Diagnosis not present

## 2016-05-08 DIAGNOSIS — E611 Iron deficiency: Secondary | ICD-10-CM | POA: Diagnosis not present

## 2016-05-08 DIAGNOSIS — Z992 Dependence on renal dialysis: Secondary | ICD-10-CM | POA: Diagnosis not present

## 2016-05-08 DIAGNOSIS — D631 Anemia in chronic kidney disease: Secondary | ICD-10-CM | POA: Diagnosis not present

## 2016-05-08 DIAGNOSIS — L299 Pruritus, unspecified: Secondary | ICD-10-CM | POA: Diagnosis not present

## 2016-05-10 DIAGNOSIS — I10 Essential (primary) hypertension: Secondary | ICD-10-CM | POA: Diagnosis not present

## 2016-05-10 DIAGNOSIS — N186 End stage renal disease: Secondary | ICD-10-CM | POA: Diagnosis not present

## 2016-05-10 DIAGNOSIS — M545 Low back pain: Secondary | ICD-10-CM | POA: Diagnosis not present

## 2016-05-10 DIAGNOSIS — J449 Chronic obstructive pulmonary disease, unspecified: Secondary | ICD-10-CM | POA: Diagnosis not present

## 2016-05-11 DIAGNOSIS — D631 Anemia in chronic kidney disease: Secondary | ICD-10-CM | POA: Diagnosis not present

## 2016-05-11 DIAGNOSIS — N186 End stage renal disease: Secondary | ICD-10-CM | POA: Diagnosis not present

## 2016-05-11 DIAGNOSIS — L299 Pruritus, unspecified: Secondary | ICD-10-CM | POA: Diagnosis not present

## 2016-05-11 DIAGNOSIS — N2581 Secondary hyperparathyroidism of renal origin: Secondary | ICD-10-CM | POA: Diagnosis not present

## 2016-05-11 DIAGNOSIS — Z992 Dependence on renal dialysis: Secondary | ICD-10-CM | POA: Diagnosis not present

## 2016-05-11 DIAGNOSIS — E611 Iron deficiency: Secondary | ICD-10-CM | POA: Diagnosis not present

## 2016-05-13 DIAGNOSIS — Z992 Dependence on renal dialysis: Secondary | ICD-10-CM | POA: Diagnosis not present

## 2016-05-13 DIAGNOSIS — D631 Anemia in chronic kidney disease: Secondary | ICD-10-CM | POA: Diagnosis not present

## 2016-05-13 DIAGNOSIS — E611 Iron deficiency: Secondary | ICD-10-CM | POA: Diagnosis not present

## 2016-05-13 DIAGNOSIS — N186 End stage renal disease: Secondary | ICD-10-CM | POA: Diagnosis not present

## 2016-05-13 DIAGNOSIS — N2581 Secondary hyperparathyroidism of renal origin: Secondary | ICD-10-CM | POA: Diagnosis not present

## 2016-05-13 DIAGNOSIS — L299 Pruritus, unspecified: Secondary | ICD-10-CM | POA: Diagnosis not present

## 2016-05-15 DIAGNOSIS — E611 Iron deficiency: Secondary | ICD-10-CM | POA: Diagnosis not present

## 2016-05-15 DIAGNOSIS — D631 Anemia in chronic kidney disease: Secondary | ICD-10-CM | POA: Diagnosis not present

## 2016-05-15 DIAGNOSIS — N186 End stage renal disease: Secondary | ICD-10-CM | POA: Diagnosis not present

## 2016-05-15 DIAGNOSIS — Z992 Dependence on renal dialysis: Secondary | ICD-10-CM | POA: Diagnosis not present

## 2016-05-15 DIAGNOSIS — N2581 Secondary hyperparathyroidism of renal origin: Secondary | ICD-10-CM | POA: Diagnosis not present

## 2016-05-15 DIAGNOSIS — L299 Pruritus, unspecified: Secondary | ICD-10-CM | POA: Diagnosis not present

## 2016-05-18 DIAGNOSIS — E611 Iron deficiency: Secondary | ICD-10-CM | POA: Diagnosis not present

## 2016-05-18 DIAGNOSIS — N186 End stage renal disease: Secondary | ICD-10-CM | POA: Diagnosis not present

## 2016-05-18 DIAGNOSIS — N2581 Secondary hyperparathyroidism of renal origin: Secondary | ICD-10-CM | POA: Diagnosis not present

## 2016-05-18 DIAGNOSIS — D631 Anemia in chronic kidney disease: Secondary | ICD-10-CM | POA: Diagnosis not present

## 2016-05-18 DIAGNOSIS — Z992 Dependence on renal dialysis: Secondary | ICD-10-CM | POA: Diagnosis not present

## 2016-05-18 DIAGNOSIS — L299 Pruritus, unspecified: Secondary | ICD-10-CM | POA: Diagnosis not present

## 2016-05-20 DIAGNOSIS — Z992 Dependence on renal dialysis: Secondary | ICD-10-CM | POA: Diagnosis not present

## 2016-05-20 DIAGNOSIS — N186 End stage renal disease: Secondary | ICD-10-CM | POA: Diagnosis not present

## 2016-05-20 DIAGNOSIS — E611 Iron deficiency: Secondary | ICD-10-CM | POA: Diagnosis not present

## 2016-05-20 DIAGNOSIS — D631 Anemia in chronic kidney disease: Secondary | ICD-10-CM | POA: Diagnosis not present

## 2016-05-20 DIAGNOSIS — L299 Pruritus, unspecified: Secondary | ICD-10-CM | POA: Diagnosis not present

## 2016-05-20 DIAGNOSIS — N2581 Secondary hyperparathyroidism of renal origin: Secondary | ICD-10-CM | POA: Diagnosis not present

## 2016-05-22 DIAGNOSIS — N2581 Secondary hyperparathyroidism of renal origin: Secondary | ICD-10-CM | POA: Diagnosis not present

## 2016-05-22 DIAGNOSIS — L299 Pruritus, unspecified: Secondary | ICD-10-CM | POA: Diagnosis not present

## 2016-05-22 DIAGNOSIS — N186 End stage renal disease: Secondary | ICD-10-CM | POA: Diagnosis not present

## 2016-05-22 DIAGNOSIS — E611 Iron deficiency: Secondary | ICD-10-CM | POA: Diagnosis not present

## 2016-05-22 DIAGNOSIS — D631 Anemia in chronic kidney disease: Secondary | ICD-10-CM | POA: Diagnosis not present

## 2016-05-22 DIAGNOSIS — Z992 Dependence on renal dialysis: Secondary | ICD-10-CM | POA: Diagnosis not present

## 2016-05-23 DIAGNOSIS — Z992 Dependence on renal dialysis: Secondary | ICD-10-CM | POA: Diagnosis not present

## 2016-05-23 DIAGNOSIS — N186 End stage renal disease: Secondary | ICD-10-CM | POA: Diagnosis not present

## 2016-05-25 DIAGNOSIS — Z992 Dependence on renal dialysis: Secondary | ICD-10-CM | POA: Diagnosis not present

## 2016-05-25 DIAGNOSIS — N2581 Secondary hyperparathyroidism of renal origin: Secondary | ICD-10-CM | POA: Diagnosis not present

## 2016-05-25 DIAGNOSIS — L299 Pruritus, unspecified: Secondary | ICD-10-CM | POA: Diagnosis not present

## 2016-05-25 DIAGNOSIS — E611 Iron deficiency: Secondary | ICD-10-CM | POA: Diagnosis not present

## 2016-05-25 DIAGNOSIS — D631 Anemia in chronic kidney disease: Secondary | ICD-10-CM | POA: Diagnosis not present

## 2016-05-25 DIAGNOSIS — N186 End stage renal disease: Secondary | ICD-10-CM | POA: Diagnosis not present

## 2016-05-27 DIAGNOSIS — N186 End stage renal disease: Secondary | ICD-10-CM | POA: Diagnosis not present

## 2016-05-27 DIAGNOSIS — N2581 Secondary hyperparathyroidism of renal origin: Secondary | ICD-10-CM | POA: Diagnosis not present

## 2016-05-27 DIAGNOSIS — L299 Pruritus, unspecified: Secondary | ICD-10-CM | POA: Diagnosis not present

## 2016-05-27 DIAGNOSIS — E611 Iron deficiency: Secondary | ICD-10-CM | POA: Diagnosis not present

## 2016-05-27 DIAGNOSIS — D631 Anemia in chronic kidney disease: Secondary | ICD-10-CM | POA: Diagnosis not present

## 2016-05-27 DIAGNOSIS — Z992 Dependence on renal dialysis: Secondary | ICD-10-CM | POA: Diagnosis not present

## 2016-05-29 DIAGNOSIS — D631 Anemia in chronic kidney disease: Secondary | ICD-10-CM | POA: Diagnosis not present

## 2016-05-29 DIAGNOSIS — E611 Iron deficiency: Secondary | ICD-10-CM | POA: Diagnosis not present

## 2016-05-29 DIAGNOSIS — N186 End stage renal disease: Secondary | ICD-10-CM | POA: Diagnosis not present

## 2016-05-29 DIAGNOSIS — N2581 Secondary hyperparathyroidism of renal origin: Secondary | ICD-10-CM | POA: Diagnosis not present

## 2016-05-29 DIAGNOSIS — Z992 Dependence on renal dialysis: Secondary | ICD-10-CM | POA: Diagnosis not present

## 2016-05-29 DIAGNOSIS — L299 Pruritus, unspecified: Secondary | ICD-10-CM | POA: Diagnosis not present

## 2016-06-01 DIAGNOSIS — D631 Anemia in chronic kidney disease: Secondary | ICD-10-CM | POA: Diagnosis not present

## 2016-06-01 DIAGNOSIS — L299 Pruritus, unspecified: Secondary | ICD-10-CM | POA: Diagnosis not present

## 2016-06-01 DIAGNOSIS — E611 Iron deficiency: Secondary | ICD-10-CM | POA: Diagnosis not present

## 2016-06-01 DIAGNOSIS — N186 End stage renal disease: Secondary | ICD-10-CM | POA: Diagnosis not present

## 2016-06-01 DIAGNOSIS — Z992 Dependence on renal dialysis: Secondary | ICD-10-CM | POA: Diagnosis not present

## 2016-06-01 DIAGNOSIS — N2581 Secondary hyperparathyroidism of renal origin: Secondary | ICD-10-CM | POA: Diagnosis not present

## 2016-06-03 DIAGNOSIS — D631 Anemia in chronic kidney disease: Secondary | ICD-10-CM | POA: Diagnosis not present

## 2016-06-03 DIAGNOSIS — N186 End stage renal disease: Secondary | ICD-10-CM | POA: Diagnosis not present

## 2016-06-03 DIAGNOSIS — Z992 Dependence on renal dialysis: Secondary | ICD-10-CM | POA: Diagnosis not present

## 2016-06-03 DIAGNOSIS — N2581 Secondary hyperparathyroidism of renal origin: Secondary | ICD-10-CM | POA: Diagnosis not present

## 2016-06-03 DIAGNOSIS — E611 Iron deficiency: Secondary | ICD-10-CM | POA: Diagnosis not present

## 2016-06-03 DIAGNOSIS — L299 Pruritus, unspecified: Secondary | ICD-10-CM | POA: Diagnosis not present

## 2016-06-05 DIAGNOSIS — E611 Iron deficiency: Secondary | ICD-10-CM | POA: Diagnosis not present

## 2016-06-05 DIAGNOSIS — N186 End stage renal disease: Secondary | ICD-10-CM | POA: Diagnosis not present

## 2016-06-05 DIAGNOSIS — L299 Pruritus, unspecified: Secondary | ICD-10-CM | POA: Diagnosis not present

## 2016-06-05 DIAGNOSIS — D631 Anemia in chronic kidney disease: Secondary | ICD-10-CM | POA: Diagnosis not present

## 2016-06-05 DIAGNOSIS — Z992 Dependence on renal dialysis: Secondary | ICD-10-CM | POA: Diagnosis not present

## 2016-06-05 DIAGNOSIS — N2581 Secondary hyperparathyroidism of renal origin: Secondary | ICD-10-CM | POA: Diagnosis not present

## 2016-06-08 DIAGNOSIS — N186 End stage renal disease: Secondary | ICD-10-CM | POA: Diagnosis not present

## 2016-06-08 DIAGNOSIS — N2581 Secondary hyperparathyroidism of renal origin: Secondary | ICD-10-CM | POA: Diagnosis not present

## 2016-06-08 DIAGNOSIS — L299 Pruritus, unspecified: Secondary | ICD-10-CM | POA: Diagnosis not present

## 2016-06-08 DIAGNOSIS — E611 Iron deficiency: Secondary | ICD-10-CM | POA: Diagnosis not present

## 2016-06-08 DIAGNOSIS — Z992 Dependence on renal dialysis: Secondary | ICD-10-CM | POA: Diagnosis not present

## 2016-06-08 DIAGNOSIS — D631 Anemia in chronic kidney disease: Secondary | ICD-10-CM | POA: Diagnosis not present

## 2016-06-10 DIAGNOSIS — L299 Pruritus, unspecified: Secondary | ICD-10-CM | POA: Diagnosis not present

## 2016-06-10 DIAGNOSIS — E611 Iron deficiency: Secondary | ICD-10-CM | POA: Diagnosis not present

## 2016-06-10 DIAGNOSIS — N186 End stage renal disease: Secondary | ICD-10-CM | POA: Diagnosis not present

## 2016-06-10 DIAGNOSIS — D631 Anemia in chronic kidney disease: Secondary | ICD-10-CM | POA: Diagnosis not present

## 2016-06-10 DIAGNOSIS — Z992 Dependence on renal dialysis: Secondary | ICD-10-CM | POA: Diagnosis not present

## 2016-06-10 DIAGNOSIS — N2581 Secondary hyperparathyroidism of renal origin: Secondary | ICD-10-CM | POA: Diagnosis not present

## 2016-06-12 DIAGNOSIS — D631 Anemia in chronic kidney disease: Secondary | ICD-10-CM | POA: Diagnosis not present

## 2016-06-12 DIAGNOSIS — E611 Iron deficiency: Secondary | ICD-10-CM | POA: Diagnosis not present

## 2016-06-12 DIAGNOSIS — L299 Pruritus, unspecified: Secondary | ICD-10-CM | POA: Diagnosis not present

## 2016-06-12 DIAGNOSIS — N2581 Secondary hyperparathyroidism of renal origin: Secondary | ICD-10-CM | POA: Diagnosis not present

## 2016-06-12 DIAGNOSIS — Z992 Dependence on renal dialysis: Secondary | ICD-10-CM | POA: Diagnosis not present

## 2016-06-12 DIAGNOSIS — N186 End stage renal disease: Secondary | ICD-10-CM | POA: Diagnosis not present

## 2016-06-15 DIAGNOSIS — L299 Pruritus, unspecified: Secondary | ICD-10-CM | POA: Diagnosis not present

## 2016-06-15 DIAGNOSIS — Z992 Dependence on renal dialysis: Secondary | ICD-10-CM | POA: Diagnosis not present

## 2016-06-15 DIAGNOSIS — D631 Anemia in chronic kidney disease: Secondary | ICD-10-CM | POA: Diagnosis not present

## 2016-06-15 DIAGNOSIS — N186 End stage renal disease: Secondary | ICD-10-CM | POA: Diagnosis not present

## 2016-06-15 DIAGNOSIS — E611 Iron deficiency: Secondary | ICD-10-CM | POA: Diagnosis not present

## 2016-06-15 DIAGNOSIS — N2581 Secondary hyperparathyroidism of renal origin: Secondary | ICD-10-CM | POA: Diagnosis not present

## 2016-06-17 DIAGNOSIS — N2581 Secondary hyperparathyroidism of renal origin: Secondary | ICD-10-CM | POA: Diagnosis not present

## 2016-06-17 DIAGNOSIS — E611 Iron deficiency: Secondary | ICD-10-CM | POA: Diagnosis not present

## 2016-06-17 DIAGNOSIS — L299 Pruritus, unspecified: Secondary | ICD-10-CM | POA: Diagnosis not present

## 2016-06-17 DIAGNOSIS — N186 End stage renal disease: Secondary | ICD-10-CM | POA: Diagnosis not present

## 2016-06-17 DIAGNOSIS — D631 Anemia in chronic kidney disease: Secondary | ICD-10-CM | POA: Diagnosis not present

## 2016-06-17 DIAGNOSIS — Z992 Dependence on renal dialysis: Secondary | ICD-10-CM | POA: Diagnosis not present

## 2016-06-19 DIAGNOSIS — E611 Iron deficiency: Secondary | ICD-10-CM | POA: Diagnosis not present

## 2016-06-19 DIAGNOSIS — L299 Pruritus, unspecified: Secondary | ICD-10-CM | POA: Diagnosis not present

## 2016-06-19 DIAGNOSIS — Z992 Dependence on renal dialysis: Secondary | ICD-10-CM | POA: Diagnosis not present

## 2016-06-19 DIAGNOSIS — D631 Anemia in chronic kidney disease: Secondary | ICD-10-CM | POA: Diagnosis not present

## 2016-06-19 DIAGNOSIS — N2581 Secondary hyperparathyroidism of renal origin: Secondary | ICD-10-CM | POA: Diagnosis not present

## 2016-06-19 DIAGNOSIS — N186 End stage renal disease: Secondary | ICD-10-CM | POA: Diagnosis not present

## 2016-06-22 DIAGNOSIS — D631 Anemia in chronic kidney disease: Secondary | ICD-10-CM | POA: Diagnosis not present

## 2016-06-22 DIAGNOSIS — N2581 Secondary hyperparathyroidism of renal origin: Secondary | ICD-10-CM | POA: Diagnosis not present

## 2016-06-22 DIAGNOSIS — E611 Iron deficiency: Secondary | ICD-10-CM | POA: Diagnosis not present

## 2016-06-22 DIAGNOSIS — L299 Pruritus, unspecified: Secondary | ICD-10-CM | POA: Diagnosis not present

## 2016-06-22 DIAGNOSIS — N186 End stage renal disease: Secondary | ICD-10-CM | POA: Diagnosis not present

## 2016-06-22 DIAGNOSIS — Z992 Dependence on renal dialysis: Secondary | ICD-10-CM | POA: Diagnosis not present

## 2016-06-23 DIAGNOSIS — N186 End stage renal disease: Secondary | ICD-10-CM | POA: Diagnosis not present

## 2016-06-23 DIAGNOSIS — Z992 Dependence on renal dialysis: Secondary | ICD-10-CM | POA: Diagnosis not present

## 2016-06-24 DIAGNOSIS — Z992 Dependence on renal dialysis: Secondary | ICD-10-CM | POA: Diagnosis not present

## 2016-06-24 DIAGNOSIS — E611 Iron deficiency: Secondary | ICD-10-CM | POA: Diagnosis not present

## 2016-06-24 DIAGNOSIS — N2581 Secondary hyperparathyroidism of renal origin: Secondary | ICD-10-CM | POA: Diagnosis not present

## 2016-06-24 DIAGNOSIS — Z23 Encounter for immunization: Secondary | ICD-10-CM | POA: Diagnosis not present

## 2016-06-24 DIAGNOSIS — D631 Anemia in chronic kidney disease: Secondary | ICD-10-CM | POA: Diagnosis not present

## 2016-06-24 DIAGNOSIS — N186 End stage renal disease: Secondary | ICD-10-CM | POA: Diagnosis not present

## 2016-06-26 DIAGNOSIS — N2581 Secondary hyperparathyroidism of renal origin: Secondary | ICD-10-CM | POA: Diagnosis not present

## 2016-06-26 DIAGNOSIS — Z992 Dependence on renal dialysis: Secondary | ICD-10-CM | POA: Diagnosis not present

## 2016-06-26 DIAGNOSIS — Z23 Encounter for immunization: Secondary | ICD-10-CM | POA: Diagnosis not present

## 2016-06-26 DIAGNOSIS — E611 Iron deficiency: Secondary | ICD-10-CM | POA: Diagnosis not present

## 2016-06-26 DIAGNOSIS — D631 Anemia in chronic kidney disease: Secondary | ICD-10-CM | POA: Diagnosis not present

## 2016-06-26 DIAGNOSIS — N186 End stage renal disease: Secondary | ICD-10-CM | POA: Diagnosis not present

## 2016-06-29 DIAGNOSIS — D631 Anemia in chronic kidney disease: Secondary | ICD-10-CM | POA: Diagnosis not present

## 2016-06-29 DIAGNOSIS — N2581 Secondary hyperparathyroidism of renal origin: Secondary | ICD-10-CM | POA: Diagnosis not present

## 2016-06-29 DIAGNOSIS — Z992 Dependence on renal dialysis: Secondary | ICD-10-CM | POA: Diagnosis not present

## 2016-06-29 DIAGNOSIS — Z23 Encounter for immunization: Secondary | ICD-10-CM | POA: Diagnosis not present

## 2016-06-29 DIAGNOSIS — N186 End stage renal disease: Secondary | ICD-10-CM | POA: Diagnosis not present

## 2016-06-29 DIAGNOSIS — E611 Iron deficiency: Secondary | ICD-10-CM | POA: Diagnosis not present

## 2016-07-01 DIAGNOSIS — D631 Anemia in chronic kidney disease: Secondary | ICD-10-CM | POA: Diagnosis not present

## 2016-07-01 DIAGNOSIS — Z992 Dependence on renal dialysis: Secondary | ICD-10-CM | POA: Diagnosis not present

## 2016-07-01 DIAGNOSIS — N186 End stage renal disease: Secondary | ICD-10-CM | POA: Diagnosis not present

## 2016-07-01 DIAGNOSIS — E611 Iron deficiency: Secondary | ICD-10-CM | POA: Diagnosis not present

## 2016-07-01 DIAGNOSIS — Z23 Encounter for immunization: Secondary | ICD-10-CM | POA: Diagnosis not present

## 2016-07-01 DIAGNOSIS — N2581 Secondary hyperparathyroidism of renal origin: Secondary | ICD-10-CM | POA: Diagnosis not present

## 2016-07-03 DIAGNOSIS — D631 Anemia in chronic kidney disease: Secondary | ICD-10-CM | POA: Diagnosis not present

## 2016-07-03 DIAGNOSIS — Z992 Dependence on renal dialysis: Secondary | ICD-10-CM | POA: Diagnosis not present

## 2016-07-03 DIAGNOSIS — Z23 Encounter for immunization: Secondary | ICD-10-CM | POA: Diagnosis not present

## 2016-07-03 DIAGNOSIS — E611 Iron deficiency: Secondary | ICD-10-CM | POA: Diagnosis not present

## 2016-07-03 DIAGNOSIS — N186 End stage renal disease: Secondary | ICD-10-CM | POA: Diagnosis not present

## 2016-07-03 DIAGNOSIS — N2581 Secondary hyperparathyroidism of renal origin: Secondary | ICD-10-CM | POA: Diagnosis not present

## 2016-07-06 DIAGNOSIS — E611 Iron deficiency: Secondary | ICD-10-CM | POA: Diagnosis not present

## 2016-07-06 DIAGNOSIS — D631 Anemia in chronic kidney disease: Secondary | ICD-10-CM | POA: Diagnosis not present

## 2016-07-06 DIAGNOSIS — N2581 Secondary hyperparathyroidism of renal origin: Secondary | ICD-10-CM | POA: Diagnosis not present

## 2016-07-06 DIAGNOSIS — N186 End stage renal disease: Secondary | ICD-10-CM | POA: Diagnosis not present

## 2016-07-06 DIAGNOSIS — Z23 Encounter for immunization: Secondary | ICD-10-CM | POA: Diagnosis not present

## 2016-07-06 DIAGNOSIS — Z992 Dependence on renal dialysis: Secondary | ICD-10-CM | POA: Diagnosis not present

## 2016-07-08 DIAGNOSIS — E611 Iron deficiency: Secondary | ICD-10-CM | POA: Diagnosis not present

## 2016-07-08 DIAGNOSIS — D631 Anemia in chronic kidney disease: Secondary | ICD-10-CM | POA: Diagnosis not present

## 2016-07-08 DIAGNOSIS — Z23 Encounter for immunization: Secondary | ICD-10-CM | POA: Diagnosis not present

## 2016-07-08 DIAGNOSIS — Z992 Dependence on renal dialysis: Secondary | ICD-10-CM | POA: Diagnosis not present

## 2016-07-08 DIAGNOSIS — N186 End stage renal disease: Secondary | ICD-10-CM | POA: Diagnosis not present

## 2016-07-08 DIAGNOSIS — N2581 Secondary hyperparathyroidism of renal origin: Secondary | ICD-10-CM | POA: Diagnosis not present

## 2016-07-10 DIAGNOSIS — D631 Anemia in chronic kidney disease: Secondary | ICD-10-CM | POA: Diagnosis not present

## 2016-07-10 DIAGNOSIS — Z23 Encounter for immunization: Secondary | ICD-10-CM | POA: Diagnosis not present

## 2016-07-10 DIAGNOSIS — N186 End stage renal disease: Secondary | ICD-10-CM | POA: Diagnosis not present

## 2016-07-10 DIAGNOSIS — N2581 Secondary hyperparathyroidism of renal origin: Secondary | ICD-10-CM | POA: Diagnosis not present

## 2016-07-10 DIAGNOSIS — E611 Iron deficiency: Secondary | ICD-10-CM | POA: Diagnosis not present

## 2016-07-10 DIAGNOSIS — Z992 Dependence on renal dialysis: Secondary | ICD-10-CM | POA: Diagnosis not present

## 2016-07-13 DIAGNOSIS — Z992 Dependence on renal dialysis: Secondary | ICD-10-CM | POA: Diagnosis not present

## 2016-07-13 DIAGNOSIS — E611 Iron deficiency: Secondary | ICD-10-CM | POA: Diagnosis not present

## 2016-07-13 DIAGNOSIS — N186 End stage renal disease: Secondary | ICD-10-CM | POA: Diagnosis not present

## 2016-07-13 DIAGNOSIS — N2581 Secondary hyperparathyroidism of renal origin: Secondary | ICD-10-CM | POA: Diagnosis not present

## 2016-07-13 DIAGNOSIS — Z23 Encounter for immunization: Secondary | ICD-10-CM | POA: Diagnosis not present

## 2016-07-13 DIAGNOSIS — D631 Anemia in chronic kidney disease: Secondary | ICD-10-CM | POA: Diagnosis not present

## 2016-07-15 DIAGNOSIS — N2581 Secondary hyperparathyroidism of renal origin: Secondary | ICD-10-CM | POA: Diagnosis not present

## 2016-07-15 DIAGNOSIS — D631 Anemia in chronic kidney disease: Secondary | ICD-10-CM | POA: Diagnosis not present

## 2016-07-15 DIAGNOSIS — Z992 Dependence on renal dialysis: Secondary | ICD-10-CM | POA: Diagnosis not present

## 2016-07-15 DIAGNOSIS — Z23 Encounter for immunization: Secondary | ICD-10-CM | POA: Diagnosis not present

## 2016-07-15 DIAGNOSIS — N186 End stage renal disease: Secondary | ICD-10-CM | POA: Diagnosis not present

## 2016-07-15 DIAGNOSIS — E611 Iron deficiency: Secondary | ICD-10-CM | POA: Diagnosis not present

## 2016-07-17 DIAGNOSIS — N186 End stage renal disease: Secondary | ICD-10-CM | POA: Diagnosis not present

## 2016-07-17 DIAGNOSIS — N2581 Secondary hyperparathyroidism of renal origin: Secondary | ICD-10-CM | POA: Diagnosis not present

## 2016-07-17 DIAGNOSIS — Z992 Dependence on renal dialysis: Secondary | ICD-10-CM | POA: Diagnosis not present

## 2016-07-17 DIAGNOSIS — D631 Anemia in chronic kidney disease: Secondary | ICD-10-CM | POA: Diagnosis not present

## 2016-07-17 DIAGNOSIS — Z23 Encounter for immunization: Secondary | ICD-10-CM | POA: Diagnosis not present

## 2016-07-17 DIAGNOSIS — E611 Iron deficiency: Secondary | ICD-10-CM | POA: Diagnosis not present

## 2016-07-20 DIAGNOSIS — D631 Anemia in chronic kidney disease: Secondary | ICD-10-CM | POA: Diagnosis not present

## 2016-07-20 DIAGNOSIS — E611 Iron deficiency: Secondary | ICD-10-CM | POA: Diagnosis not present

## 2016-07-20 DIAGNOSIS — Z992 Dependence on renal dialysis: Secondary | ICD-10-CM | POA: Diagnosis not present

## 2016-07-20 DIAGNOSIS — N2581 Secondary hyperparathyroidism of renal origin: Secondary | ICD-10-CM | POA: Diagnosis not present

## 2016-07-20 DIAGNOSIS — Z23 Encounter for immunization: Secondary | ICD-10-CM | POA: Diagnosis not present

## 2016-07-20 DIAGNOSIS — N186 End stage renal disease: Secondary | ICD-10-CM | POA: Diagnosis not present

## 2016-07-21 DIAGNOSIS — N186 End stage renal disease: Secondary | ICD-10-CM | POA: Diagnosis not present

## 2016-07-21 DIAGNOSIS — Z992 Dependence on renal dialysis: Secondary | ICD-10-CM | POA: Diagnosis not present

## 2016-07-22 DIAGNOSIS — N2581 Secondary hyperparathyroidism of renal origin: Secondary | ICD-10-CM | POA: Diagnosis not present

## 2016-07-22 DIAGNOSIS — E611 Iron deficiency: Secondary | ICD-10-CM | POA: Diagnosis not present

## 2016-07-22 DIAGNOSIS — Z992 Dependence on renal dialysis: Secondary | ICD-10-CM | POA: Diagnosis not present

## 2016-07-22 DIAGNOSIS — D631 Anemia in chronic kidney disease: Secondary | ICD-10-CM | POA: Diagnosis not present

## 2016-07-22 DIAGNOSIS — N186 End stage renal disease: Secondary | ICD-10-CM | POA: Diagnosis not present

## 2016-07-24 DIAGNOSIS — D631 Anemia in chronic kidney disease: Secondary | ICD-10-CM | POA: Diagnosis not present

## 2016-07-24 DIAGNOSIS — E611 Iron deficiency: Secondary | ICD-10-CM | POA: Diagnosis not present

## 2016-07-24 DIAGNOSIS — Z992 Dependence on renal dialysis: Secondary | ICD-10-CM | POA: Diagnosis not present

## 2016-07-24 DIAGNOSIS — N2581 Secondary hyperparathyroidism of renal origin: Secondary | ICD-10-CM | POA: Diagnosis not present

## 2016-07-24 DIAGNOSIS — N186 End stage renal disease: Secondary | ICD-10-CM | POA: Diagnosis not present

## 2016-07-27 DIAGNOSIS — N2581 Secondary hyperparathyroidism of renal origin: Secondary | ICD-10-CM | POA: Diagnosis not present

## 2016-07-27 DIAGNOSIS — Z992 Dependence on renal dialysis: Secondary | ICD-10-CM | POA: Diagnosis not present

## 2016-07-27 DIAGNOSIS — E611 Iron deficiency: Secondary | ICD-10-CM | POA: Diagnosis not present

## 2016-07-27 DIAGNOSIS — N186 End stage renal disease: Secondary | ICD-10-CM | POA: Diagnosis not present

## 2016-07-27 DIAGNOSIS — D631 Anemia in chronic kidney disease: Secondary | ICD-10-CM | POA: Diagnosis not present

## 2016-07-29 DIAGNOSIS — N2581 Secondary hyperparathyroidism of renal origin: Secondary | ICD-10-CM | POA: Diagnosis not present

## 2016-07-29 DIAGNOSIS — E611 Iron deficiency: Secondary | ICD-10-CM | POA: Diagnosis not present

## 2016-07-29 DIAGNOSIS — N186 End stage renal disease: Secondary | ICD-10-CM | POA: Diagnosis not present

## 2016-07-29 DIAGNOSIS — Z992 Dependence on renal dialysis: Secondary | ICD-10-CM | POA: Diagnosis not present

## 2016-07-29 DIAGNOSIS — D631 Anemia in chronic kidney disease: Secondary | ICD-10-CM | POA: Diagnosis not present

## 2016-07-31 DIAGNOSIS — N2581 Secondary hyperparathyroidism of renal origin: Secondary | ICD-10-CM | POA: Diagnosis not present

## 2016-07-31 DIAGNOSIS — Z992 Dependence on renal dialysis: Secondary | ICD-10-CM | POA: Diagnosis not present

## 2016-07-31 DIAGNOSIS — E611 Iron deficiency: Secondary | ICD-10-CM | POA: Diagnosis not present

## 2016-07-31 DIAGNOSIS — N186 End stage renal disease: Secondary | ICD-10-CM | POA: Diagnosis not present

## 2016-07-31 DIAGNOSIS — D631 Anemia in chronic kidney disease: Secondary | ICD-10-CM | POA: Diagnosis not present

## 2016-08-03 DIAGNOSIS — Z992 Dependence on renal dialysis: Secondary | ICD-10-CM | POA: Diagnosis not present

## 2016-08-03 DIAGNOSIS — D631 Anemia in chronic kidney disease: Secondary | ICD-10-CM | POA: Diagnosis not present

## 2016-08-03 DIAGNOSIS — E611 Iron deficiency: Secondary | ICD-10-CM | POA: Diagnosis not present

## 2016-08-03 DIAGNOSIS — N2581 Secondary hyperparathyroidism of renal origin: Secondary | ICD-10-CM | POA: Diagnosis not present

## 2016-08-03 DIAGNOSIS — N186 End stage renal disease: Secondary | ICD-10-CM | POA: Diagnosis not present

## 2016-08-05 DIAGNOSIS — D631 Anemia in chronic kidney disease: Secondary | ICD-10-CM | POA: Diagnosis not present

## 2016-08-05 DIAGNOSIS — E611 Iron deficiency: Secondary | ICD-10-CM | POA: Diagnosis not present

## 2016-08-05 DIAGNOSIS — N186 End stage renal disease: Secondary | ICD-10-CM | POA: Diagnosis not present

## 2016-08-05 DIAGNOSIS — N2581 Secondary hyperparathyroidism of renal origin: Secondary | ICD-10-CM | POA: Diagnosis not present

## 2016-08-05 DIAGNOSIS — Z79899 Other long term (current) drug therapy: Secondary | ICD-10-CM | POA: Diagnosis not present

## 2016-08-05 DIAGNOSIS — Z992 Dependence on renal dialysis: Secondary | ICD-10-CM | POA: Diagnosis not present

## 2016-08-07 DIAGNOSIS — Z992 Dependence on renal dialysis: Secondary | ICD-10-CM | POA: Diagnosis not present

## 2016-08-07 DIAGNOSIS — N186 End stage renal disease: Secondary | ICD-10-CM | POA: Diagnosis not present

## 2016-08-07 DIAGNOSIS — N2581 Secondary hyperparathyroidism of renal origin: Secondary | ICD-10-CM | POA: Diagnosis not present

## 2016-08-07 DIAGNOSIS — D631 Anemia in chronic kidney disease: Secondary | ICD-10-CM | POA: Diagnosis not present

## 2016-08-07 DIAGNOSIS — E611 Iron deficiency: Secondary | ICD-10-CM | POA: Diagnosis not present

## 2016-08-10 DIAGNOSIS — N2581 Secondary hyperparathyroidism of renal origin: Secondary | ICD-10-CM | POA: Diagnosis not present

## 2016-08-10 DIAGNOSIS — E611 Iron deficiency: Secondary | ICD-10-CM | POA: Diagnosis not present

## 2016-08-10 DIAGNOSIS — Z992 Dependence on renal dialysis: Secondary | ICD-10-CM | POA: Diagnosis not present

## 2016-08-10 DIAGNOSIS — N186 End stage renal disease: Secondary | ICD-10-CM | POA: Diagnosis not present

## 2016-08-10 DIAGNOSIS — D631 Anemia in chronic kidney disease: Secondary | ICD-10-CM | POA: Diagnosis not present

## 2016-08-12 DIAGNOSIS — D631 Anemia in chronic kidney disease: Secondary | ICD-10-CM | POA: Diagnosis not present

## 2016-08-12 DIAGNOSIS — N186 End stage renal disease: Secondary | ICD-10-CM | POA: Diagnosis not present

## 2016-08-12 DIAGNOSIS — E611 Iron deficiency: Secondary | ICD-10-CM | POA: Diagnosis not present

## 2016-08-12 DIAGNOSIS — N2581 Secondary hyperparathyroidism of renal origin: Secondary | ICD-10-CM | POA: Diagnosis not present

## 2016-08-12 DIAGNOSIS — Z992 Dependence on renal dialysis: Secondary | ICD-10-CM | POA: Diagnosis not present

## 2016-08-14 DIAGNOSIS — N2581 Secondary hyperparathyroidism of renal origin: Secondary | ICD-10-CM | POA: Diagnosis not present

## 2016-08-14 DIAGNOSIS — Z992 Dependence on renal dialysis: Secondary | ICD-10-CM | POA: Diagnosis not present

## 2016-08-14 DIAGNOSIS — D631 Anemia in chronic kidney disease: Secondary | ICD-10-CM | POA: Diagnosis not present

## 2016-08-14 DIAGNOSIS — E611 Iron deficiency: Secondary | ICD-10-CM | POA: Diagnosis not present

## 2016-08-14 DIAGNOSIS — N186 End stage renal disease: Secondary | ICD-10-CM | POA: Diagnosis not present

## 2016-08-17 DIAGNOSIS — D631 Anemia in chronic kidney disease: Secondary | ICD-10-CM | POA: Diagnosis not present

## 2016-08-17 DIAGNOSIS — E611 Iron deficiency: Secondary | ICD-10-CM | POA: Diagnosis not present

## 2016-08-17 DIAGNOSIS — N186 End stage renal disease: Secondary | ICD-10-CM | POA: Diagnosis not present

## 2016-08-17 DIAGNOSIS — N2581 Secondary hyperparathyroidism of renal origin: Secondary | ICD-10-CM | POA: Diagnosis not present

## 2016-08-17 DIAGNOSIS — Z992 Dependence on renal dialysis: Secondary | ICD-10-CM | POA: Diagnosis not present

## 2016-08-19 DIAGNOSIS — D631 Anemia in chronic kidney disease: Secondary | ICD-10-CM | POA: Diagnosis not present

## 2016-08-19 DIAGNOSIS — Z992 Dependence on renal dialysis: Secondary | ICD-10-CM | POA: Diagnosis not present

## 2016-08-19 DIAGNOSIS — E611 Iron deficiency: Secondary | ICD-10-CM | POA: Diagnosis not present

## 2016-08-19 DIAGNOSIS — N2581 Secondary hyperparathyroidism of renal origin: Secondary | ICD-10-CM | POA: Diagnosis not present

## 2016-08-19 DIAGNOSIS — N186 End stage renal disease: Secondary | ICD-10-CM | POA: Diagnosis not present

## 2016-08-21 DIAGNOSIS — D631 Anemia in chronic kidney disease: Secondary | ICD-10-CM | POA: Diagnosis not present

## 2016-08-21 DIAGNOSIS — Z992 Dependence on renal dialysis: Secondary | ICD-10-CM | POA: Diagnosis not present

## 2016-08-21 DIAGNOSIS — N186 End stage renal disease: Secondary | ICD-10-CM | POA: Diagnosis not present

## 2016-08-21 DIAGNOSIS — E611 Iron deficiency: Secondary | ICD-10-CM | POA: Diagnosis not present

## 2016-08-21 DIAGNOSIS — N2581 Secondary hyperparathyroidism of renal origin: Secondary | ICD-10-CM | POA: Diagnosis not present

## 2016-08-22 DIAGNOSIS — N2581 Secondary hyperparathyroidism of renal origin: Secondary | ICD-10-CM | POA: Diagnosis not present

## 2016-08-22 DIAGNOSIS — N186 End stage renal disease: Secondary | ICD-10-CM | POA: Diagnosis not present

## 2016-08-22 DIAGNOSIS — Z992 Dependence on renal dialysis: Secondary | ICD-10-CM | POA: Diagnosis not present

## 2016-08-22 DIAGNOSIS — D509 Iron deficiency anemia, unspecified: Secondary | ICD-10-CM | POA: Diagnosis not present

## 2016-08-24 DIAGNOSIS — D509 Iron deficiency anemia, unspecified: Secondary | ICD-10-CM | POA: Diagnosis not present

## 2016-08-24 DIAGNOSIS — Z992 Dependence on renal dialysis: Secondary | ICD-10-CM | POA: Diagnosis not present

## 2016-08-24 DIAGNOSIS — N2581 Secondary hyperparathyroidism of renal origin: Secondary | ICD-10-CM | POA: Diagnosis not present

## 2016-08-24 DIAGNOSIS — N186 End stage renal disease: Secondary | ICD-10-CM | POA: Diagnosis not present

## 2016-08-26 DIAGNOSIS — N186 End stage renal disease: Secondary | ICD-10-CM | POA: Diagnosis not present

## 2016-08-26 DIAGNOSIS — N2581 Secondary hyperparathyroidism of renal origin: Secondary | ICD-10-CM | POA: Diagnosis not present

## 2016-08-26 DIAGNOSIS — Z992 Dependence on renal dialysis: Secondary | ICD-10-CM | POA: Diagnosis not present

## 2016-08-26 DIAGNOSIS — D509 Iron deficiency anemia, unspecified: Secondary | ICD-10-CM | POA: Diagnosis not present

## 2016-08-28 DIAGNOSIS — N2581 Secondary hyperparathyroidism of renal origin: Secondary | ICD-10-CM | POA: Diagnosis not present

## 2016-08-28 DIAGNOSIS — Z992 Dependence on renal dialysis: Secondary | ICD-10-CM | POA: Diagnosis not present

## 2016-08-28 DIAGNOSIS — N186 End stage renal disease: Secondary | ICD-10-CM | POA: Diagnosis not present

## 2016-08-28 DIAGNOSIS — D509 Iron deficiency anemia, unspecified: Secondary | ICD-10-CM | POA: Diagnosis not present

## 2016-08-31 DIAGNOSIS — Z992 Dependence on renal dialysis: Secondary | ICD-10-CM | POA: Diagnosis not present

## 2016-08-31 DIAGNOSIS — N2581 Secondary hyperparathyroidism of renal origin: Secondary | ICD-10-CM | POA: Diagnosis not present

## 2016-08-31 DIAGNOSIS — N186 End stage renal disease: Secondary | ICD-10-CM | POA: Diagnosis not present

## 2016-08-31 DIAGNOSIS — D509 Iron deficiency anemia, unspecified: Secondary | ICD-10-CM | POA: Diagnosis not present

## 2016-09-02 DIAGNOSIS — D509 Iron deficiency anemia, unspecified: Secondary | ICD-10-CM | POA: Diagnosis not present

## 2016-09-02 DIAGNOSIS — N186 End stage renal disease: Secondary | ICD-10-CM | POA: Diagnosis not present

## 2016-09-02 DIAGNOSIS — N2581 Secondary hyperparathyroidism of renal origin: Secondary | ICD-10-CM | POA: Diagnosis not present

## 2016-09-02 DIAGNOSIS — Z992 Dependence on renal dialysis: Secondary | ICD-10-CM | POA: Diagnosis not present

## 2016-09-04 DIAGNOSIS — D509 Iron deficiency anemia, unspecified: Secondary | ICD-10-CM | POA: Diagnosis not present

## 2016-09-04 DIAGNOSIS — Z992 Dependence on renal dialysis: Secondary | ICD-10-CM | POA: Diagnosis not present

## 2016-09-04 DIAGNOSIS — N186 End stage renal disease: Secondary | ICD-10-CM | POA: Diagnosis not present

## 2016-09-04 DIAGNOSIS — N2581 Secondary hyperparathyroidism of renal origin: Secondary | ICD-10-CM | POA: Diagnosis not present

## 2016-09-07 DIAGNOSIS — N186 End stage renal disease: Secondary | ICD-10-CM | POA: Diagnosis not present

## 2016-09-07 DIAGNOSIS — N2581 Secondary hyperparathyroidism of renal origin: Secondary | ICD-10-CM | POA: Diagnosis not present

## 2016-09-07 DIAGNOSIS — Z992 Dependence on renal dialysis: Secondary | ICD-10-CM | POA: Diagnosis not present

## 2016-09-07 DIAGNOSIS — D509 Iron deficiency anemia, unspecified: Secondary | ICD-10-CM | POA: Diagnosis not present

## 2016-09-08 DIAGNOSIS — J449 Chronic obstructive pulmonary disease, unspecified: Secondary | ICD-10-CM | POA: Diagnosis not present

## 2016-09-08 DIAGNOSIS — I120 Hypertensive chronic kidney disease with stage 5 chronic kidney disease or end stage renal disease: Secondary | ICD-10-CM | POA: Diagnosis not present

## 2016-09-08 DIAGNOSIS — N186 End stage renal disease: Secondary | ICD-10-CM | POA: Diagnosis not present

## 2016-09-08 DIAGNOSIS — M545 Low back pain: Secondary | ICD-10-CM | POA: Diagnosis not present

## 2016-09-09 DIAGNOSIS — N186 End stage renal disease: Secondary | ICD-10-CM | POA: Diagnosis not present

## 2016-09-09 DIAGNOSIS — D509 Iron deficiency anemia, unspecified: Secondary | ICD-10-CM | POA: Diagnosis not present

## 2016-09-09 DIAGNOSIS — N2581 Secondary hyperparathyroidism of renal origin: Secondary | ICD-10-CM | POA: Diagnosis not present

## 2016-09-09 DIAGNOSIS — Z992 Dependence on renal dialysis: Secondary | ICD-10-CM | POA: Diagnosis not present

## 2016-09-11 DIAGNOSIS — Z992 Dependence on renal dialysis: Secondary | ICD-10-CM | POA: Diagnosis not present

## 2016-09-11 DIAGNOSIS — D509 Iron deficiency anemia, unspecified: Secondary | ICD-10-CM | POA: Diagnosis not present

## 2016-09-11 DIAGNOSIS — N186 End stage renal disease: Secondary | ICD-10-CM | POA: Diagnosis not present

## 2016-09-11 DIAGNOSIS — N2581 Secondary hyperparathyroidism of renal origin: Secondary | ICD-10-CM | POA: Diagnosis not present

## 2016-09-14 DIAGNOSIS — D509 Iron deficiency anemia, unspecified: Secondary | ICD-10-CM | POA: Diagnosis not present

## 2016-09-14 DIAGNOSIS — Z992 Dependence on renal dialysis: Secondary | ICD-10-CM | POA: Diagnosis not present

## 2016-09-14 DIAGNOSIS — N186 End stage renal disease: Secondary | ICD-10-CM | POA: Diagnosis not present

## 2016-09-14 DIAGNOSIS — N2581 Secondary hyperparathyroidism of renal origin: Secondary | ICD-10-CM | POA: Diagnosis not present

## 2016-09-16 DIAGNOSIS — N186 End stage renal disease: Secondary | ICD-10-CM | POA: Diagnosis not present

## 2016-09-16 DIAGNOSIS — D509 Iron deficiency anemia, unspecified: Secondary | ICD-10-CM | POA: Diagnosis not present

## 2016-09-16 DIAGNOSIS — N2581 Secondary hyperparathyroidism of renal origin: Secondary | ICD-10-CM | POA: Diagnosis not present

## 2016-09-16 DIAGNOSIS — Z992 Dependence on renal dialysis: Secondary | ICD-10-CM | POA: Diagnosis not present

## 2016-09-18 DIAGNOSIS — N2581 Secondary hyperparathyroidism of renal origin: Secondary | ICD-10-CM | POA: Diagnosis not present

## 2016-09-18 DIAGNOSIS — Z992 Dependence on renal dialysis: Secondary | ICD-10-CM | POA: Diagnosis not present

## 2016-09-18 DIAGNOSIS — D509 Iron deficiency anemia, unspecified: Secondary | ICD-10-CM | POA: Diagnosis not present

## 2016-09-18 DIAGNOSIS — N186 End stage renal disease: Secondary | ICD-10-CM | POA: Diagnosis not present

## 2016-09-20 DIAGNOSIS — N186 End stage renal disease: Secondary | ICD-10-CM | POA: Diagnosis not present

## 2016-09-20 DIAGNOSIS — Z992 Dependence on renal dialysis: Secondary | ICD-10-CM | POA: Diagnosis not present

## 2016-09-21 DIAGNOSIS — D631 Anemia in chronic kidney disease: Secondary | ICD-10-CM | POA: Diagnosis not present

## 2016-09-21 DIAGNOSIS — Z992 Dependence on renal dialysis: Secondary | ICD-10-CM | POA: Diagnosis not present

## 2016-09-21 DIAGNOSIS — N186 End stage renal disease: Secondary | ICD-10-CM | POA: Diagnosis not present

## 2016-09-21 DIAGNOSIS — E611 Iron deficiency: Secondary | ICD-10-CM | POA: Diagnosis not present

## 2016-09-21 DIAGNOSIS — N2581 Secondary hyperparathyroidism of renal origin: Secondary | ICD-10-CM | POA: Diagnosis not present

## 2016-09-23 DIAGNOSIS — N2581 Secondary hyperparathyroidism of renal origin: Secondary | ICD-10-CM | POA: Diagnosis not present

## 2016-09-23 DIAGNOSIS — Z992 Dependence on renal dialysis: Secondary | ICD-10-CM | POA: Diagnosis not present

## 2016-09-23 DIAGNOSIS — D631 Anemia in chronic kidney disease: Secondary | ICD-10-CM | POA: Diagnosis not present

## 2016-09-23 DIAGNOSIS — N186 End stage renal disease: Secondary | ICD-10-CM | POA: Diagnosis not present

## 2016-09-23 DIAGNOSIS — E611 Iron deficiency: Secondary | ICD-10-CM | POA: Diagnosis not present

## 2016-09-25 DIAGNOSIS — E611 Iron deficiency: Secondary | ICD-10-CM | POA: Diagnosis not present

## 2016-09-25 DIAGNOSIS — D631 Anemia in chronic kidney disease: Secondary | ICD-10-CM | POA: Diagnosis not present

## 2016-09-25 DIAGNOSIS — N2581 Secondary hyperparathyroidism of renal origin: Secondary | ICD-10-CM | POA: Diagnosis not present

## 2016-09-25 DIAGNOSIS — Z992 Dependence on renal dialysis: Secondary | ICD-10-CM | POA: Diagnosis not present

## 2016-09-25 DIAGNOSIS — N186 End stage renal disease: Secondary | ICD-10-CM | POA: Diagnosis not present

## 2016-09-28 DIAGNOSIS — Z992 Dependence on renal dialysis: Secondary | ICD-10-CM | POA: Diagnosis not present

## 2016-09-28 DIAGNOSIS — N186 End stage renal disease: Secondary | ICD-10-CM | POA: Diagnosis not present

## 2016-09-28 DIAGNOSIS — E611 Iron deficiency: Secondary | ICD-10-CM | POA: Diagnosis not present

## 2016-09-28 DIAGNOSIS — N2581 Secondary hyperparathyroidism of renal origin: Secondary | ICD-10-CM | POA: Diagnosis not present

## 2016-09-28 DIAGNOSIS — D631 Anemia in chronic kidney disease: Secondary | ICD-10-CM | POA: Diagnosis not present

## 2016-09-30 DIAGNOSIS — D631 Anemia in chronic kidney disease: Secondary | ICD-10-CM | POA: Diagnosis not present

## 2016-09-30 DIAGNOSIS — N2581 Secondary hyperparathyroidism of renal origin: Secondary | ICD-10-CM | POA: Diagnosis not present

## 2016-09-30 DIAGNOSIS — N186 End stage renal disease: Secondary | ICD-10-CM | POA: Diagnosis not present

## 2016-09-30 DIAGNOSIS — Z992 Dependence on renal dialysis: Secondary | ICD-10-CM | POA: Diagnosis not present

## 2016-09-30 DIAGNOSIS — E611 Iron deficiency: Secondary | ICD-10-CM | POA: Diagnosis not present

## 2016-10-02 DIAGNOSIS — E611 Iron deficiency: Secondary | ICD-10-CM | POA: Diagnosis not present

## 2016-10-02 DIAGNOSIS — Z992 Dependence on renal dialysis: Secondary | ICD-10-CM | POA: Diagnosis not present

## 2016-10-02 DIAGNOSIS — N186 End stage renal disease: Secondary | ICD-10-CM | POA: Diagnosis not present

## 2016-10-02 DIAGNOSIS — N2581 Secondary hyperparathyroidism of renal origin: Secondary | ICD-10-CM | POA: Diagnosis not present

## 2016-10-02 DIAGNOSIS — D631 Anemia in chronic kidney disease: Secondary | ICD-10-CM | POA: Diagnosis not present

## 2016-10-05 DIAGNOSIS — D631 Anemia in chronic kidney disease: Secondary | ICD-10-CM | POA: Diagnosis not present

## 2016-10-05 DIAGNOSIS — N186 End stage renal disease: Secondary | ICD-10-CM | POA: Diagnosis not present

## 2016-10-05 DIAGNOSIS — Z992 Dependence on renal dialysis: Secondary | ICD-10-CM | POA: Diagnosis not present

## 2016-10-05 DIAGNOSIS — N2581 Secondary hyperparathyroidism of renal origin: Secondary | ICD-10-CM | POA: Diagnosis not present

## 2016-10-05 DIAGNOSIS — E611 Iron deficiency: Secondary | ICD-10-CM | POA: Diagnosis not present

## 2016-10-07 DIAGNOSIS — E611 Iron deficiency: Secondary | ICD-10-CM | POA: Diagnosis not present

## 2016-10-07 DIAGNOSIS — N2581 Secondary hyperparathyroidism of renal origin: Secondary | ICD-10-CM | POA: Diagnosis not present

## 2016-10-07 DIAGNOSIS — Z992 Dependence on renal dialysis: Secondary | ICD-10-CM | POA: Diagnosis not present

## 2016-10-07 DIAGNOSIS — D631 Anemia in chronic kidney disease: Secondary | ICD-10-CM | POA: Diagnosis not present

## 2016-10-07 DIAGNOSIS — N186 End stage renal disease: Secondary | ICD-10-CM | POA: Diagnosis not present

## 2016-10-09 DIAGNOSIS — D631 Anemia in chronic kidney disease: Secondary | ICD-10-CM | POA: Diagnosis not present

## 2016-10-09 DIAGNOSIS — N2581 Secondary hyperparathyroidism of renal origin: Secondary | ICD-10-CM | POA: Diagnosis not present

## 2016-10-09 DIAGNOSIS — Z992 Dependence on renal dialysis: Secondary | ICD-10-CM | POA: Diagnosis not present

## 2016-10-09 DIAGNOSIS — E611 Iron deficiency: Secondary | ICD-10-CM | POA: Diagnosis not present

## 2016-10-09 DIAGNOSIS — N186 End stage renal disease: Secondary | ICD-10-CM | POA: Diagnosis not present

## 2016-10-12 DIAGNOSIS — N186 End stage renal disease: Secondary | ICD-10-CM | POA: Diagnosis not present

## 2016-10-12 DIAGNOSIS — Z992 Dependence on renal dialysis: Secondary | ICD-10-CM | POA: Diagnosis not present

## 2016-10-12 DIAGNOSIS — E611 Iron deficiency: Secondary | ICD-10-CM | POA: Diagnosis not present

## 2016-10-12 DIAGNOSIS — N2581 Secondary hyperparathyroidism of renal origin: Secondary | ICD-10-CM | POA: Diagnosis not present

## 2016-10-12 DIAGNOSIS — D631 Anemia in chronic kidney disease: Secondary | ICD-10-CM | POA: Diagnosis not present

## 2016-10-14 DIAGNOSIS — D631 Anemia in chronic kidney disease: Secondary | ICD-10-CM | POA: Diagnosis not present

## 2016-10-14 DIAGNOSIS — E611 Iron deficiency: Secondary | ICD-10-CM | POA: Diagnosis not present

## 2016-10-14 DIAGNOSIS — Z992 Dependence on renal dialysis: Secondary | ICD-10-CM | POA: Diagnosis not present

## 2016-10-14 DIAGNOSIS — N186 End stage renal disease: Secondary | ICD-10-CM | POA: Diagnosis not present

## 2016-10-14 DIAGNOSIS — N2581 Secondary hyperparathyroidism of renal origin: Secondary | ICD-10-CM | POA: Diagnosis not present

## 2016-10-16 DIAGNOSIS — E611 Iron deficiency: Secondary | ICD-10-CM | POA: Diagnosis not present

## 2016-10-16 DIAGNOSIS — Z992 Dependence on renal dialysis: Secondary | ICD-10-CM | POA: Diagnosis not present

## 2016-10-16 DIAGNOSIS — N186 End stage renal disease: Secondary | ICD-10-CM | POA: Diagnosis not present

## 2016-10-16 DIAGNOSIS — N2581 Secondary hyperparathyroidism of renal origin: Secondary | ICD-10-CM | POA: Diagnosis not present

## 2016-10-16 DIAGNOSIS — D631 Anemia in chronic kidney disease: Secondary | ICD-10-CM | POA: Diagnosis not present

## 2016-10-19 DIAGNOSIS — D631 Anemia in chronic kidney disease: Secondary | ICD-10-CM | POA: Diagnosis not present

## 2016-10-19 DIAGNOSIS — N186 End stage renal disease: Secondary | ICD-10-CM | POA: Diagnosis not present

## 2016-10-19 DIAGNOSIS — N2581 Secondary hyperparathyroidism of renal origin: Secondary | ICD-10-CM | POA: Diagnosis not present

## 2016-10-19 DIAGNOSIS — Z992 Dependence on renal dialysis: Secondary | ICD-10-CM | POA: Diagnosis not present

## 2016-10-19 DIAGNOSIS — E611 Iron deficiency: Secondary | ICD-10-CM | POA: Diagnosis not present

## 2016-10-21 DIAGNOSIS — Z992 Dependence on renal dialysis: Secondary | ICD-10-CM | POA: Diagnosis not present

## 2016-10-21 DIAGNOSIS — N2581 Secondary hyperparathyroidism of renal origin: Secondary | ICD-10-CM | POA: Diagnosis not present

## 2016-10-21 DIAGNOSIS — D631 Anemia in chronic kidney disease: Secondary | ICD-10-CM | POA: Diagnosis not present

## 2016-10-21 DIAGNOSIS — N186 End stage renal disease: Secondary | ICD-10-CM | POA: Diagnosis not present

## 2016-10-21 DIAGNOSIS — E611 Iron deficiency: Secondary | ICD-10-CM | POA: Diagnosis not present

## 2016-10-22 DIAGNOSIS — E611 Iron deficiency: Secondary | ICD-10-CM | POA: Diagnosis not present

## 2016-10-22 DIAGNOSIS — Z992 Dependence on renal dialysis: Secondary | ICD-10-CM | POA: Diagnosis not present

## 2016-10-22 DIAGNOSIS — N186 End stage renal disease: Secondary | ICD-10-CM | POA: Diagnosis not present

## 2016-10-22 DIAGNOSIS — N2581 Secondary hyperparathyroidism of renal origin: Secondary | ICD-10-CM | POA: Diagnosis not present

## 2016-10-22 DIAGNOSIS — D631 Anemia in chronic kidney disease: Secondary | ICD-10-CM | POA: Diagnosis not present

## 2016-10-23 DIAGNOSIS — Z992 Dependence on renal dialysis: Secondary | ICD-10-CM | POA: Diagnosis not present

## 2016-10-23 DIAGNOSIS — N2581 Secondary hyperparathyroidism of renal origin: Secondary | ICD-10-CM | POA: Diagnosis not present

## 2016-10-23 DIAGNOSIS — N186 End stage renal disease: Secondary | ICD-10-CM | POA: Diagnosis not present

## 2016-10-23 DIAGNOSIS — D631 Anemia in chronic kidney disease: Secondary | ICD-10-CM | POA: Diagnosis not present

## 2016-10-23 DIAGNOSIS — E611 Iron deficiency: Secondary | ICD-10-CM | POA: Diagnosis not present

## 2016-10-26 DIAGNOSIS — E611 Iron deficiency: Secondary | ICD-10-CM | POA: Diagnosis not present

## 2016-10-26 DIAGNOSIS — D631 Anemia in chronic kidney disease: Secondary | ICD-10-CM | POA: Diagnosis not present

## 2016-10-26 DIAGNOSIS — Z992 Dependence on renal dialysis: Secondary | ICD-10-CM | POA: Diagnosis not present

## 2016-10-26 DIAGNOSIS — N186 End stage renal disease: Secondary | ICD-10-CM | POA: Diagnosis not present

## 2016-10-26 DIAGNOSIS — N2581 Secondary hyperparathyroidism of renal origin: Secondary | ICD-10-CM | POA: Diagnosis not present

## 2016-10-28 DIAGNOSIS — N2581 Secondary hyperparathyroidism of renal origin: Secondary | ICD-10-CM | POA: Diagnosis not present

## 2016-10-28 DIAGNOSIS — Z992 Dependence on renal dialysis: Secondary | ICD-10-CM | POA: Diagnosis not present

## 2016-10-28 DIAGNOSIS — D631 Anemia in chronic kidney disease: Secondary | ICD-10-CM | POA: Diagnosis not present

## 2016-10-28 DIAGNOSIS — E611 Iron deficiency: Secondary | ICD-10-CM | POA: Diagnosis not present

## 2016-10-28 DIAGNOSIS — N186 End stage renal disease: Secondary | ICD-10-CM | POA: Diagnosis not present

## 2016-10-30 DIAGNOSIS — N186 End stage renal disease: Secondary | ICD-10-CM | POA: Diagnosis not present

## 2016-10-30 DIAGNOSIS — Z992 Dependence on renal dialysis: Secondary | ICD-10-CM | POA: Diagnosis not present

## 2016-10-30 DIAGNOSIS — E611 Iron deficiency: Secondary | ICD-10-CM | POA: Diagnosis not present

## 2016-10-30 DIAGNOSIS — D631 Anemia in chronic kidney disease: Secondary | ICD-10-CM | POA: Diagnosis not present

## 2016-10-30 DIAGNOSIS — N2581 Secondary hyperparathyroidism of renal origin: Secondary | ICD-10-CM | POA: Diagnosis not present

## 2016-11-02 DIAGNOSIS — D631 Anemia in chronic kidney disease: Secondary | ICD-10-CM | POA: Diagnosis not present

## 2016-11-02 DIAGNOSIS — N186 End stage renal disease: Secondary | ICD-10-CM | POA: Diagnosis not present

## 2016-11-02 DIAGNOSIS — E611 Iron deficiency: Secondary | ICD-10-CM | POA: Diagnosis not present

## 2016-11-02 DIAGNOSIS — N2581 Secondary hyperparathyroidism of renal origin: Secondary | ICD-10-CM | POA: Diagnosis not present

## 2016-11-02 DIAGNOSIS — Z992 Dependence on renal dialysis: Secondary | ICD-10-CM | POA: Diagnosis not present

## 2016-11-04 DIAGNOSIS — E611 Iron deficiency: Secondary | ICD-10-CM | POA: Diagnosis not present

## 2016-11-04 DIAGNOSIS — N2581 Secondary hyperparathyroidism of renal origin: Secondary | ICD-10-CM | POA: Diagnosis not present

## 2016-11-04 DIAGNOSIS — N186 End stage renal disease: Secondary | ICD-10-CM | POA: Diagnosis not present

## 2016-11-04 DIAGNOSIS — D631 Anemia in chronic kidney disease: Secondary | ICD-10-CM | POA: Diagnosis not present

## 2016-11-04 DIAGNOSIS — Z992 Dependence on renal dialysis: Secondary | ICD-10-CM | POA: Diagnosis not present

## 2016-11-06 DIAGNOSIS — N2581 Secondary hyperparathyroidism of renal origin: Secondary | ICD-10-CM | POA: Diagnosis not present

## 2016-11-06 DIAGNOSIS — N186 End stage renal disease: Secondary | ICD-10-CM | POA: Diagnosis not present

## 2016-11-06 DIAGNOSIS — D631 Anemia in chronic kidney disease: Secondary | ICD-10-CM | POA: Diagnosis not present

## 2016-11-06 DIAGNOSIS — Z992 Dependence on renal dialysis: Secondary | ICD-10-CM | POA: Diagnosis not present

## 2016-11-06 DIAGNOSIS — E611 Iron deficiency: Secondary | ICD-10-CM | POA: Diagnosis not present

## 2016-11-09 DIAGNOSIS — N186 End stage renal disease: Secondary | ICD-10-CM | POA: Diagnosis not present

## 2016-11-09 DIAGNOSIS — E611 Iron deficiency: Secondary | ICD-10-CM | POA: Diagnosis not present

## 2016-11-09 DIAGNOSIS — D631 Anemia in chronic kidney disease: Secondary | ICD-10-CM | POA: Diagnosis not present

## 2016-11-09 DIAGNOSIS — Z992 Dependence on renal dialysis: Secondary | ICD-10-CM | POA: Diagnosis not present

## 2016-11-09 DIAGNOSIS — N2581 Secondary hyperparathyroidism of renal origin: Secondary | ICD-10-CM | POA: Diagnosis not present

## 2016-11-11 DIAGNOSIS — D631 Anemia in chronic kidney disease: Secondary | ICD-10-CM | POA: Diagnosis not present

## 2016-11-11 DIAGNOSIS — Z992 Dependence on renal dialysis: Secondary | ICD-10-CM | POA: Diagnosis not present

## 2016-11-11 DIAGNOSIS — N186 End stage renal disease: Secondary | ICD-10-CM | POA: Diagnosis not present

## 2016-11-11 DIAGNOSIS — N2581 Secondary hyperparathyroidism of renal origin: Secondary | ICD-10-CM | POA: Diagnosis not present

## 2016-11-11 DIAGNOSIS — E611 Iron deficiency: Secondary | ICD-10-CM | POA: Diagnosis not present

## 2016-11-13 DIAGNOSIS — N2581 Secondary hyperparathyroidism of renal origin: Secondary | ICD-10-CM | POA: Diagnosis not present

## 2016-11-13 DIAGNOSIS — E611 Iron deficiency: Secondary | ICD-10-CM | POA: Diagnosis not present

## 2016-11-13 DIAGNOSIS — N186 End stage renal disease: Secondary | ICD-10-CM | POA: Diagnosis not present

## 2016-11-13 DIAGNOSIS — D631 Anemia in chronic kidney disease: Secondary | ICD-10-CM | POA: Diagnosis not present

## 2016-11-13 DIAGNOSIS — Z992 Dependence on renal dialysis: Secondary | ICD-10-CM | POA: Diagnosis not present

## 2016-11-15 ENCOUNTER — Emergency Department (HOSPITAL_COMMUNITY)
Admission: EM | Admit: 2016-11-15 | Discharge: 2016-11-15 | Disposition: A | Discharge status: Home / Self Care | Payer: Medicare Other | Attending: Emergency Medicine | Admitting: Emergency Medicine

## 2016-11-15 DIAGNOSIS — I5032 Chronic diastolic (congestive) heart failure: Secondary | ICD-10-CM | POA: Diagnosis not present

## 2016-11-15 DIAGNOSIS — G8929 Other chronic pain: Secondary | ICD-10-CM | POA: Insufficient documentation

## 2016-11-15 DIAGNOSIS — S81852A Open bite, left lower leg, initial encounter: Secondary | ICD-10-CM | POA: Insufficient documentation

## 2016-11-15 DIAGNOSIS — F17210 Nicotine dependence, cigarettes, uncomplicated: Secondary | ICD-10-CM | POA: Diagnosis not present

## 2016-11-15 DIAGNOSIS — Z23 Encounter for immunization: Secondary | ICD-10-CM | POA: Diagnosis not present

## 2016-11-15 DIAGNOSIS — Z7982 Long term (current) use of aspirin: Secondary | ICD-10-CM | POA: Insufficient documentation

## 2016-11-15 DIAGNOSIS — I120 Hypertensive chronic kidney disease with stage 5 chronic kidney disease or end stage renal disease: Secondary | ICD-10-CM | POA: Diagnosis not present

## 2016-11-15 DIAGNOSIS — Z79899 Other long term (current) drug therapy: Secondary | ICD-10-CM | POA: Insufficient documentation

## 2016-11-15 DIAGNOSIS — W540XXA Bitten by dog, initial encounter: Secondary | ICD-10-CM | POA: Insufficient documentation

## 2016-11-15 DIAGNOSIS — Y999 Unspecified external cause status: Secondary | ICD-10-CM | POA: Diagnosis not present

## 2016-11-15 DIAGNOSIS — N186 End stage renal disease: Secondary | ICD-10-CM | POA: Diagnosis not present

## 2016-11-15 DIAGNOSIS — Y939 Activity, unspecified: Secondary | ICD-10-CM | POA: Insufficient documentation

## 2016-11-15 DIAGNOSIS — Z992 Dependence on renal dialysis: Secondary | ICD-10-CM | POA: Diagnosis not present

## 2016-11-15 DIAGNOSIS — S8991XA Unspecified injury of right lower leg, initial encounter: Secondary | ICD-10-CM | POA: Diagnosis present

## 2016-11-15 DIAGNOSIS — S81831A Puncture wound without foreign body, right lower leg, initial encounter: Secondary | ICD-10-CM | POA: Diagnosis not present

## 2016-11-15 DIAGNOSIS — Y929 Unspecified place or not applicable: Secondary | ICD-10-CM | POA: Insufficient documentation

## 2016-11-15 DIAGNOSIS — I110 Hypertensive heart disease with heart failure: Secondary | ICD-10-CM | POA: Insufficient documentation

## 2016-11-15 MED ORDER — AMOXICILLIN-POT CLAVULANATE 875-125 MG PO TABS: 1.0000 | ORAL_TABLET | Freq: Two times a day (BID) | ORAL | 0 refills | Status: DC

## 2016-11-15 MED ORDER — BACITRACIN ZINC 500 UNIT/GM EX OINT
TOPICAL_OINTMENT | CUTANEOUS | Status: AC
  Administered 2016-11-15: 1
  Filled 2016-11-15: qty 0.9

## 2016-11-15 MED ORDER — TETANUS-DIPHTH-ACELL PERTUSSIS 5-2.5-18.5 LF-MCG/0.5 IM SUSP
0.5000 mL | Freq: Once | INTRAMUSCULAR | Status: AC
  Administered 2016-11-15: 0.5 mL via INTRAMUSCULAR
  Filled 2016-11-15: qty 0.5

## 2016-11-15 MED ORDER — AMOXICILLIN-POT CLAVULANATE 875-125 MG PO TABS
1.0000 | ORAL_TABLET | Freq: Once | ORAL | Status: AC
  Administered 2016-11-15: 1 via ORAL
  Filled 2016-11-15: qty 1

## 2016-11-15 NOTE — ED Notes (Signed)
Pt with small break in skin to right LE from a bit of neighbor's dog.  Pt states that dog is a pitbull puppy.

## 2016-11-15 NOTE — Discharge Instructions (Signed)
Clean the wound with mild soap and water. Keep it bandaged. Return here for any signs of infection. Keep in contact with the animal control officer to verify the dogs vaccinations or quarantine

## 2016-11-15 NOTE — ED Provider Notes (Signed)
[facility] DEPT Provider Note   CSN: [id number] Arrival date & time: 11/15/16  1023     History   Chief Complaint Chief Complaint  Patient presents with  . Animal Bite    HPI Brian Barrera is a 46 y.o. male.  HPI   Brian Barrera is a 46 y.o. male who presents to the Emergency Department complaining of dog bite to the right lower leg.  States that he was bitten by his neighbor's dog who was chained at the time.  Describes a small puncture that penetrated his jeans.  No bleeding.  Complains of "soreness" to his lateral leg with movement.  Pt unsure of last Td.  Denies swelling, numbness of the leg.  Past Medical History:  Diagnosis Date  . Anemia    Attributed to chronic disease/chronic kidney disease  . Chronic diastolic heart failure 5462   EF 35% by echo in 2011; echo in 08/2011-normal EF, moderate to severe LVH; BNP level of [phone] in 2011-12  . Chronic obstructive pulmonary disease    with asthmatic component  . ESRD (end stage renal disease) on dialysis    secondary hyperparathyroidism  . Hypertension    h/o hypertensive crisis with encephalopathy  . Obesity   . Tobacco abuse    Approximate consumption of 25 pack years; continuing at 0.5 pack per day    Patient Active Problem List   Diagnosis Date Noted  . Heme positive stool 01/14/2014  . Chronic shoulder pain 06/15/2012  . OA (osteoarthritis) of knee 06/15/2012  . Hypertension   . Chronic obstructive pulmonary disease ([place])   . Chronic diastolic heart failure ([place])   . Tobacco abuse   . ED (erectile dysfunction) 10/11/2011  . Insomnia 08/11/2011  . ESRD (end stage renal disease) on dialysis ([place]) 06/28/2011  . Anemia of renal disease 06/28/2011  . Obesity 06/28/2011    Past Surgical History:  Procedure Laterality Date  . ARTERIOVENOUS GRAFT PLACEMENT  2011  . COLONOSCOPY N/A 02/04/2014   Procedure: COLONOSCOPY;  Surgeon: [name], MD;  Location: [facility]O SUITE;  Service: Endoscopy;   Laterality: N/A;  12:00       Home Medications    Prior to Admission medications   Medication Sig Start Date End Date Taking? Authorizing Provider  albuterol (PROAIR HFA) 108 (90 BASE) MCG/ACT inhaler Inhale 2 puffs into the lungs every 4 (four) hours as needed for wheezing. 02/11/14  Yes [place], Modena Nunnery, MD  amLODipine (NORVASC) 10 MG tablet TAKE 1 TABLET EVERY DAY 07/06/13  Yes [place], Modena Nunnery, MD  aspirin 325 MG tablet Take 325 mg by mouth daily.   Yes [provider]  AURYXIA 1 GM 210 MG(Fe) tablet Take 210 mg by mouth 3 (three) times daily with meals.  11/02/16  Yes [provider]  b complex-vitamin c-folic acid (NEPHRO-VITE) 0.8 MG TABS Take 0.8 mg by mouth at bedtime.   Yes [provider]  budesonide-formoterol (SYMBICORT) 80-4.5 MCG/ACT inhaler Inhale 2 puffs into the lungs 2 (two) times daily. 06/05/13  Yes [place], Modena Nunnery, MD  diphenhydramine-acetaminophen (TYLENOL PM) 25-500 MG TABS tablet Take 1 tablet by mouth at bedtime as needed.   Yes [provider]  hydrALAZINE (APRESOLINE) 50 MG tablet TAKE 1 TABLET THREE TIMES DAILY 04/11/13  Yes [place], Modena Nunnery, MD  HYDROcodone-acetaminophen (NORCO/VICODIN) 5-325 MG per tablet Take 1 tablet by mouth every 6 (six) hours as needed for pain. 02/21/13  Yes [place], Modena Nunnery, MD  labetalol (NORMODYNE) 200  MG tablet TAKE 1 TABLET TWICE DAILY 07/06/13  Yes [place], Modena Nunnery, MD  SENSIPAR 30 MG tablet Take 30 mg by mouth daily with breakfast.  01/04/14  Yes [provider]  sevelamer (RENVELA) 800 MG tablet Take 800 mg by mouth 3 (three) times daily with meals.   Yes [provider]  cloNIDine (CATAPRES) 0.3 MG tablet TAKE 1 TABLET THREE TIMES DAILY Patient not taking: Reported on 11/15/2016 07/06/13   [name], MD  traZODone (DESYREL) 50 MG tablet Take 1-2 tablet at bedtime as needed Patient not taking: Reported on 11/15/2016 01/01/13   [name], MD    Family  History Family History  Problem Relation Age of Onset  . Heart disease Father   . Diabetes Sister   . Hypertension Sister   . Diabetes Sister   . Hypertension Sister   . Kidney disease Mother   . Hypertension Mother   . Hypertension Sister   . Hypertension Brother   . Hypertension Brother   . Hypertension Brother   . Colon cancer Neg Hx     Social History Social History  Substance Use Topics  . Smoking status: Current Every Day Smoker    Packs/day: 0.50    Years: 25.00    Types: Cigarettes  . Smokeless tobacco: Not on file  . Alcohol use No     Comment: quit etoh about 2011. used to drink about 8-10 beers a week     Allergies   Patient has no known allergies.   Review of Systems Review of Systems  Constitutional: Negative for chills and fever.  Musculoskeletal: Negative for arthralgias, back pain and joint swelling.  Skin: Positive for wound.       Dog bite right lower leg  Neurological: Negative for dizziness, weakness and numbness.  Hematological: Does not bruise/bleed easily.  All other systems reviewed and are negative.    Physical Exam Updated Vital Signs BP 101/66   Pulse 79   Temp 98.1 F (36.7 C) (Oral)   Resp 18   Ht 6' (1.829 m)   Wt 120.2 kg (265 lb)   SpO2 99%   BMI 35.94 kg/m   Physical Exam  Constitutional: He is oriented to person, place, and time. He appears well-developed and well-nourished. No distress.  HENT:  Head: Normocephalic and atraumatic.  Cardiovascular: Normal rate, regular rhythm, normal heart sounds and intact distal pulses.   No murmur heard. Pulmonary/Chest: Effort normal and breath sounds normal. No respiratory distress.  Musculoskeletal: He exhibits tenderness. He exhibits no edema.  Focal tenderness to palpation of the right lateral lower leg.  No edema, compartments soft.   Neurological: He is alert and oriented to person, place, and time. No sensory deficit. He exhibits normal muscle tone. Coordination normal.   Skin: Skin is warm. Capillary refill takes less than 2 seconds. No rash noted. No erythema.  Superficial, small puncture wound to the right lower leg.  No hematoma.  Bleeding controlled  Nursing note and vitals reviewed.    ED Treatments / Results  Labs (all labs ordered are listed, but only abnormal results are displayed) Labs Reviewed - No data to display  EKG  EKG Interpretation None       Radiology No results found.  Procedures Procedures (including critical care time)  Medications Ordered in ED Medications  Tdap (BOOSTRIX) injection 0.5 mL (not administered)  amoxicillin-clavulanate (AUGMENTIN) 875-125 MG per tablet 1 tablet (not administered)     Initial Impression / Assessment and  Plan / ED Course  I have reviewed the triage vital signs and the nursing notes.  Pertinent labs & imaging results that were available during my care of the patient were reviewed by me and considered in my medical decision making (see chart for details).     [place] PD came to dept and took report.  Will verify animal's vaccination status or quarantine the dog.  Dog is currently restrained on the owner's property per patient.  [place] protocol not indicated at this time.  Wound is superficial, NV intact.  Cleaned with wound cleanser.  Bandaged, TD updated.  Return precautions discussed.    Final Clinical Impressions(s) / ED Diagnoses   Final diagnoses:  Dog bite, initial encounter    New Prescriptions New Prescriptions   No medications on file     Kem Parkinson, Hershal Coria 11/17/16 1745    Milton Ferguson, MD 11/19/16 1021

## 2016-11-15 NOTE — ED Triage Notes (Signed)
Puncture wound to right lower leg, states he was bitten by neighbors dog

## 2016-11-16 DIAGNOSIS — Z992 Dependence on renal dialysis: Secondary | ICD-10-CM | POA: Diagnosis not present

## 2016-11-16 DIAGNOSIS — N186 End stage renal disease: Secondary | ICD-10-CM | POA: Diagnosis not present

## 2016-11-16 DIAGNOSIS — E611 Iron deficiency: Secondary | ICD-10-CM | POA: Diagnosis not present

## 2016-11-16 DIAGNOSIS — D631 Anemia in chronic kidney disease: Secondary | ICD-10-CM | POA: Diagnosis not present

## 2016-11-16 DIAGNOSIS — N2581 Secondary hyperparathyroidism of renal origin: Secondary | ICD-10-CM | POA: Diagnosis not present

## 2016-11-18 DIAGNOSIS — E611 Iron deficiency: Secondary | ICD-10-CM | POA: Diagnosis not present

## 2016-11-18 DIAGNOSIS — N186 End stage renal disease: Secondary | ICD-10-CM | POA: Diagnosis not present

## 2016-11-18 DIAGNOSIS — D631 Anemia in chronic kidney disease: Secondary | ICD-10-CM | POA: Diagnosis not present

## 2016-11-18 DIAGNOSIS — N2581 Secondary hyperparathyroidism of renal origin: Secondary | ICD-10-CM | POA: Diagnosis not present

## 2016-11-18 DIAGNOSIS — Z992 Dependence on renal dialysis: Secondary | ICD-10-CM | POA: Diagnosis not present

## 2016-11-20 DIAGNOSIS — N186 End stage renal disease: Secondary | ICD-10-CM | POA: Diagnosis not present

## 2016-11-20 DIAGNOSIS — N2581 Secondary hyperparathyroidism of renal origin: Secondary | ICD-10-CM | POA: Diagnosis not present

## 2016-11-20 DIAGNOSIS — D631 Anemia in chronic kidney disease: Secondary | ICD-10-CM | POA: Diagnosis not present

## 2016-11-20 DIAGNOSIS — E611 Iron deficiency: Secondary | ICD-10-CM | POA: Diagnosis not present

## 2016-11-20 DIAGNOSIS — Z992 Dependence on renal dialysis: Secondary | ICD-10-CM | POA: Diagnosis not present

## 2016-11-21 DIAGNOSIS — D509 Iron deficiency anemia, unspecified: Secondary | ICD-10-CM | POA: Diagnosis not present

## 2016-11-21 DIAGNOSIS — D631 Anemia in chronic kidney disease: Secondary | ICD-10-CM | POA: Diagnosis not present

## 2016-11-21 DIAGNOSIS — Z992 Dependence on renal dialysis: Secondary | ICD-10-CM | POA: Diagnosis not present

## 2016-11-21 DIAGNOSIS — N186 End stage renal disease: Secondary | ICD-10-CM | POA: Diagnosis not present

## 2016-11-21 DIAGNOSIS — N2581 Secondary hyperparathyroidism of renal origin: Secondary | ICD-10-CM | POA: Diagnosis not present

## 2016-11-23 DIAGNOSIS — D631 Anemia in chronic kidney disease: Secondary | ICD-10-CM | POA: Diagnosis not present

## 2016-11-23 DIAGNOSIS — N2581 Secondary hyperparathyroidism of renal origin: Secondary | ICD-10-CM | POA: Diagnosis not present

## 2016-11-23 DIAGNOSIS — N186 End stage renal disease: Secondary | ICD-10-CM | POA: Diagnosis not present

## 2016-11-23 DIAGNOSIS — D509 Iron deficiency anemia, unspecified: Secondary | ICD-10-CM | POA: Diagnosis not present

## 2016-11-23 DIAGNOSIS — Z992 Dependence on renal dialysis: Secondary | ICD-10-CM | POA: Diagnosis not present

## 2016-11-25 DIAGNOSIS — Z992 Dependence on renal dialysis: Secondary | ICD-10-CM | POA: Diagnosis not present

## 2016-11-25 DIAGNOSIS — N2581 Secondary hyperparathyroidism of renal origin: Secondary | ICD-10-CM | POA: Diagnosis not present

## 2016-11-25 DIAGNOSIS — D631 Anemia in chronic kidney disease: Secondary | ICD-10-CM | POA: Diagnosis not present

## 2016-11-25 DIAGNOSIS — N186 End stage renal disease: Secondary | ICD-10-CM | POA: Diagnosis not present

## 2016-11-25 DIAGNOSIS — D509 Iron deficiency anemia, unspecified: Secondary | ICD-10-CM | POA: Diagnosis not present

## 2016-11-27 DIAGNOSIS — D631 Anemia in chronic kidney disease: Secondary | ICD-10-CM | POA: Diagnosis not present

## 2016-11-27 DIAGNOSIS — Z992 Dependence on renal dialysis: Secondary | ICD-10-CM | POA: Diagnosis not present

## 2016-11-27 DIAGNOSIS — N2581 Secondary hyperparathyroidism of renal origin: Secondary | ICD-10-CM | POA: Diagnosis not present

## 2016-11-27 DIAGNOSIS — D509 Iron deficiency anemia, unspecified: Secondary | ICD-10-CM | POA: Diagnosis not present

## 2016-11-27 DIAGNOSIS — N186 End stage renal disease: Secondary | ICD-10-CM | POA: Diagnosis not present

## 2016-11-30 DIAGNOSIS — N2581 Secondary hyperparathyroidism of renal origin: Secondary | ICD-10-CM | POA: Diagnosis not present

## 2016-11-30 DIAGNOSIS — N186 End stage renal disease: Secondary | ICD-10-CM | POA: Diagnosis not present

## 2016-11-30 DIAGNOSIS — Z992 Dependence on renal dialysis: Secondary | ICD-10-CM | POA: Diagnosis not present

## 2016-11-30 DIAGNOSIS — D509 Iron deficiency anemia, unspecified: Secondary | ICD-10-CM | POA: Diagnosis not present

## 2016-11-30 DIAGNOSIS — D631 Anemia in chronic kidney disease: Secondary | ICD-10-CM | POA: Diagnosis not present

## 2016-12-02 DIAGNOSIS — D631 Anemia in chronic kidney disease: Secondary | ICD-10-CM | POA: Diagnosis not present

## 2016-12-02 DIAGNOSIS — N2581 Secondary hyperparathyroidism of renal origin: Secondary | ICD-10-CM | POA: Diagnosis not present

## 2016-12-02 DIAGNOSIS — N186 End stage renal disease: Secondary | ICD-10-CM | POA: Diagnosis not present

## 2016-12-02 DIAGNOSIS — D509 Iron deficiency anemia, unspecified: Secondary | ICD-10-CM | POA: Diagnosis not present

## 2016-12-02 DIAGNOSIS — Z992 Dependence on renal dialysis: Secondary | ICD-10-CM | POA: Diagnosis not present

## 2016-12-04 DIAGNOSIS — N186 End stage renal disease: Secondary | ICD-10-CM | POA: Diagnosis not present

## 2016-12-04 DIAGNOSIS — D509 Iron deficiency anemia, unspecified: Secondary | ICD-10-CM | POA: Diagnosis not present

## 2016-12-04 DIAGNOSIS — N2581 Secondary hyperparathyroidism of renal origin: Secondary | ICD-10-CM | POA: Diagnosis not present

## 2016-12-04 DIAGNOSIS — D631 Anemia in chronic kidney disease: Secondary | ICD-10-CM | POA: Diagnosis not present

## 2016-12-04 DIAGNOSIS — Z992 Dependence on renal dialysis: Secondary | ICD-10-CM | POA: Diagnosis not present

## 2016-12-07 DIAGNOSIS — Z992 Dependence on renal dialysis: Secondary | ICD-10-CM | POA: Diagnosis not present

## 2016-12-07 DIAGNOSIS — D631 Anemia in chronic kidney disease: Secondary | ICD-10-CM | POA: Diagnosis not present

## 2016-12-07 DIAGNOSIS — N186 End stage renal disease: Secondary | ICD-10-CM | POA: Diagnosis not present

## 2016-12-07 DIAGNOSIS — N2581 Secondary hyperparathyroidism of renal origin: Secondary | ICD-10-CM | POA: Diagnosis not present

## 2016-12-07 DIAGNOSIS — D509 Iron deficiency anemia, unspecified: Secondary | ICD-10-CM | POA: Diagnosis not present

## 2016-12-09 DIAGNOSIS — Z992 Dependence on renal dialysis: Secondary | ICD-10-CM | POA: Diagnosis not present

## 2016-12-09 DIAGNOSIS — D631 Anemia in chronic kidney disease: Secondary | ICD-10-CM | POA: Diagnosis not present

## 2016-12-09 DIAGNOSIS — D509 Iron deficiency anemia, unspecified: Secondary | ICD-10-CM | POA: Diagnosis not present

## 2016-12-09 DIAGNOSIS — N186 End stage renal disease: Secondary | ICD-10-CM | POA: Diagnosis not present

## 2016-12-09 DIAGNOSIS — N2581 Secondary hyperparathyroidism of renal origin: Secondary | ICD-10-CM | POA: Diagnosis not present

## 2016-12-11 DIAGNOSIS — N186 End stage renal disease: Secondary | ICD-10-CM | POA: Diagnosis not present

## 2016-12-11 DIAGNOSIS — D509 Iron deficiency anemia, unspecified: Secondary | ICD-10-CM | POA: Diagnosis not present

## 2016-12-11 DIAGNOSIS — D631 Anemia in chronic kidney disease: Secondary | ICD-10-CM | POA: Diagnosis not present

## 2016-12-11 DIAGNOSIS — Z992 Dependence on renal dialysis: Secondary | ICD-10-CM | POA: Diagnosis not present

## 2016-12-11 DIAGNOSIS — N2581 Secondary hyperparathyroidism of renal origin: Secondary | ICD-10-CM | POA: Diagnosis not present

## 2016-12-14 DIAGNOSIS — Z992 Dependence on renal dialysis: Secondary | ICD-10-CM | POA: Diagnosis not present

## 2016-12-14 DIAGNOSIS — D631 Anemia in chronic kidney disease: Secondary | ICD-10-CM | POA: Diagnosis not present

## 2016-12-14 DIAGNOSIS — N2581 Secondary hyperparathyroidism of renal origin: Secondary | ICD-10-CM | POA: Diagnosis not present

## 2016-12-14 DIAGNOSIS — N186 End stage renal disease: Secondary | ICD-10-CM | POA: Diagnosis not present

## 2016-12-14 DIAGNOSIS — D509 Iron deficiency anemia, unspecified: Secondary | ICD-10-CM | POA: Diagnosis not present

## 2016-12-16 DIAGNOSIS — D631 Anemia in chronic kidney disease: Secondary | ICD-10-CM | POA: Diagnosis not present

## 2016-12-16 DIAGNOSIS — Z992 Dependence on renal dialysis: Secondary | ICD-10-CM | POA: Diagnosis not present

## 2016-12-16 DIAGNOSIS — N186 End stage renal disease: Secondary | ICD-10-CM | POA: Diagnosis not present

## 2016-12-16 DIAGNOSIS — D509 Iron deficiency anemia, unspecified: Secondary | ICD-10-CM | POA: Diagnosis not present

## 2016-12-16 DIAGNOSIS — N2581 Secondary hyperparathyroidism of renal origin: Secondary | ICD-10-CM | POA: Diagnosis not present

## 2016-12-18 DIAGNOSIS — N186 End stage renal disease: Secondary | ICD-10-CM | POA: Diagnosis not present

## 2016-12-18 DIAGNOSIS — D631 Anemia in chronic kidney disease: Secondary | ICD-10-CM | POA: Diagnosis not present

## 2016-12-18 DIAGNOSIS — D509 Iron deficiency anemia, unspecified: Secondary | ICD-10-CM | POA: Diagnosis not present

## 2016-12-18 DIAGNOSIS — Z992 Dependence on renal dialysis: Secondary | ICD-10-CM | POA: Diagnosis not present

## 2016-12-18 DIAGNOSIS — N2581 Secondary hyperparathyroidism of renal origin: Secondary | ICD-10-CM | POA: Diagnosis not present

## 2016-12-21 DIAGNOSIS — N2581 Secondary hyperparathyroidism of renal origin: Secondary | ICD-10-CM | POA: Diagnosis not present

## 2016-12-21 DIAGNOSIS — D631 Anemia in chronic kidney disease: Secondary | ICD-10-CM | POA: Diagnosis not present

## 2016-12-21 DIAGNOSIS — D509 Iron deficiency anemia, unspecified: Secondary | ICD-10-CM | POA: Diagnosis not present

## 2016-12-21 DIAGNOSIS — N186 End stage renal disease: Secondary | ICD-10-CM | POA: Diagnosis not present

## 2016-12-21 DIAGNOSIS — Z992 Dependence on renal dialysis: Secondary | ICD-10-CM | POA: Diagnosis not present

## 2016-12-22 DIAGNOSIS — Z992 Dependence on renal dialysis: Secondary | ICD-10-CM | POA: Diagnosis not present

## 2016-12-22 DIAGNOSIS — L299 Pruritus, unspecified: Secondary | ICD-10-CM | POA: Diagnosis not present

## 2016-12-22 DIAGNOSIS — N186 End stage renal disease: Secondary | ICD-10-CM | POA: Diagnosis not present

## 2016-12-22 DIAGNOSIS — R6883 Chills (without fever): Secondary | ICD-10-CM | POA: Diagnosis not present

## 2016-12-22 DIAGNOSIS — E611 Iron deficiency: Secondary | ICD-10-CM | POA: Diagnosis not present

## 2016-12-22 DIAGNOSIS — T82898A Other specified complication of vascular prosthetic devices, implants and grafts, initial encounter: Secondary | ICD-10-CM | POA: Diagnosis not present

## 2016-12-22 DIAGNOSIS — B9561 Methicillin susceptible Staphylococcus aureus infection as the cause of diseases classified elsewhere: Secondary | ICD-10-CM | POA: Diagnosis not present

## 2016-12-22 DIAGNOSIS — D631 Anemia in chronic kidney disease: Secondary | ICD-10-CM | POA: Diagnosis not present

## 2016-12-22 DIAGNOSIS — R7881 Bacteremia: Secondary | ICD-10-CM | POA: Diagnosis not present

## 2016-12-22 DIAGNOSIS — N2581 Secondary hyperparathyroidism of renal origin: Secondary | ICD-10-CM | POA: Diagnosis not present

## 2016-12-23 DIAGNOSIS — N186 End stage renal disease: Secondary | ICD-10-CM | POA: Diagnosis not present

## 2016-12-23 DIAGNOSIS — E611 Iron deficiency: Secondary | ICD-10-CM | POA: Diagnosis not present

## 2016-12-23 DIAGNOSIS — T82898A Other specified complication of vascular prosthetic devices, implants and grafts, initial encounter: Secondary | ICD-10-CM | POA: Diagnosis not present

## 2016-12-23 DIAGNOSIS — R7881 Bacteremia: Secondary | ICD-10-CM | POA: Diagnosis not present

## 2016-12-23 DIAGNOSIS — N2581 Secondary hyperparathyroidism of renal origin: Secondary | ICD-10-CM | POA: Diagnosis not present

## 2016-12-23 DIAGNOSIS — L299 Pruritus, unspecified: Secondary | ICD-10-CM | POA: Diagnosis not present

## 2016-12-25 DIAGNOSIS — R7881 Bacteremia: Secondary | ICD-10-CM | POA: Diagnosis not present

## 2016-12-25 DIAGNOSIS — E611 Iron deficiency: Secondary | ICD-10-CM | POA: Diagnosis not present

## 2016-12-25 DIAGNOSIS — T82898A Other specified complication of vascular prosthetic devices, implants and grafts, initial encounter: Secondary | ICD-10-CM | POA: Diagnosis not present

## 2016-12-25 DIAGNOSIS — N2581 Secondary hyperparathyroidism of renal origin: Secondary | ICD-10-CM | POA: Diagnosis not present

## 2016-12-25 DIAGNOSIS — N186 End stage renal disease: Secondary | ICD-10-CM | POA: Diagnosis not present

## 2016-12-25 DIAGNOSIS — L299 Pruritus, unspecified: Secondary | ICD-10-CM | POA: Diagnosis not present

## 2016-12-28 DIAGNOSIS — T82898A Other specified complication of vascular prosthetic devices, implants and grafts, initial encounter: Secondary | ICD-10-CM | POA: Diagnosis not present

## 2016-12-28 DIAGNOSIS — L299 Pruritus, unspecified: Secondary | ICD-10-CM | POA: Diagnosis not present

## 2016-12-28 DIAGNOSIS — N186 End stage renal disease: Secondary | ICD-10-CM | POA: Diagnosis not present

## 2016-12-28 DIAGNOSIS — E611 Iron deficiency: Secondary | ICD-10-CM | POA: Diagnosis not present

## 2016-12-28 DIAGNOSIS — R7881 Bacteremia: Secondary | ICD-10-CM | POA: Diagnosis not present

## 2016-12-28 DIAGNOSIS — N2581 Secondary hyperparathyroidism of renal origin: Secondary | ICD-10-CM | POA: Diagnosis not present

## 2016-12-30 DIAGNOSIS — N186 End stage renal disease: Secondary | ICD-10-CM | POA: Diagnosis not present

## 2016-12-30 DIAGNOSIS — E611 Iron deficiency: Secondary | ICD-10-CM | POA: Diagnosis not present

## 2016-12-30 DIAGNOSIS — N2581 Secondary hyperparathyroidism of renal origin: Secondary | ICD-10-CM | POA: Diagnosis not present

## 2016-12-30 DIAGNOSIS — R7881 Bacteremia: Secondary | ICD-10-CM | POA: Diagnosis not present

## 2016-12-30 DIAGNOSIS — T82898A Other specified complication of vascular prosthetic devices, implants and grafts, initial encounter: Secondary | ICD-10-CM | POA: Diagnosis not present

## 2016-12-30 DIAGNOSIS — L299 Pruritus, unspecified: Secondary | ICD-10-CM | POA: Diagnosis not present

## 2017-01-01 DIAGNOSIS — R7881 Bacteremia: Secondary | ICD-10-CM | POA: Diagnosis not present

## 2017-01-01 DIAGNOSIS — N186 End stage renal disease: Secondary | ICD-10-CM | POA: Diagnosis not present

## 2017-01-01 DIAGNOSIS — E611 Iron deficiency: Secondary | ICD-10-CM | POA: Diagnosis not present

## 2017-01-01 DIAGNOSIS — N2581 Secondary hyperparathyroidism of renal origin: Secondary | ICD-10-CM | POA: Diagnosis not present

## 2017-01-01 DIAGNOSIS — T82898A Other specified complication of vascular prosthetic devices, implants and grafts, initial encounter: Secondary | ICD-10-CM | POA: Diagnosis not present

## 2017-01-01 DIAGNOSIS — L299 Pruritus, unspecified: Secondary | ICD-10-CM | POA: Diagnosis not present

## 2017-01-04 DIAGNOSIS — R7881 Bacteremia: Secondary | ICD-10-CM | POA: Diagnosis not present

## 2017-01-04 DIAGNOSIS — N186 End stage renal disease: Secondary | ICD-10-CM | POA: Diagnosis not present

## 2017-01-04 DIAGNOSIS — L299 Pruritus, unspecified: Secondary | ICD-10-CM | POA: Diagnosis not present

## 2017-01-04 DIAGNOSIS — N2581 Secondary hyperparathyroidism of renal origin: Secondary | ICD-10-CM | POA: Diagnosis not present

## 2017-01-04 DIAGNOSIS — T82898A Other specified complication of vascular prosthetic devices, implants and grafts, initial encounter: Secondary | ICD-10-CM | POA: Diagnosis not present

## 2017-01-04 DIAGNOSIS — E611 Iron deficiency: Secondary | ICD-10-CM | POA: Diagnosis not present

## 2017-01-04 DIAGNOSIS — Z992 Dependence on renal dialysis: Secondary | ICD-10-CM | POA: Diagnosis not present

## 2017-01-06 DIAGNOSIS — T82898A Other specified complication of vascular prosthetic devices, implants and grafts, initial encounter: Secondary | ICD-10-CM | POA: Diagnosis not present

## 2017-01-06 DIAGNOSIS — N186 End stage renal disease: Secondary | ICD-10-CM | POA: Diagnosis not present

## 2017-01-06 DIAGNOSIS — E611 Iron deficiency: Secondary | ICD-10-CM | POA: Diagnosis not present

## 2017-01-06 DIAGNOSIS — L299 Pruritus, unspecified: Secondary | ICD-10-CM | POA: Diagnosis not present

## 2017-01-06 DIAGNOSIS — R7881 Bacteremia: Secondary | ICD-10-CM | POA: Diagnosis not present

## 2017-01-06 DIAGNOSIS — N2581 Secondary hyperparathyroidism of renal origin: Secondary | ICD-10-CM | POA: Diagnosis not present

## 2017-01-08 DIAGNOSIS — N186 End stage renal disease: Secondary | ICD-10-CM | POA: Diagnosis not present

## 2017-01-08 DIAGNOSIS — T82898A Other specified complication of vascular prosthetic devices, implants and grafts, initial encounter: Secondary | ICD-10-CM | POA: Diagnosis not present

## 2017-01-08 DIAGNOSIS — N2581 Secondary hyperparathyroidism of renal origin: Secondary | ICD-10-CM | POA: Diagnosis not present

## 2017-01-08 DIAGNOSIS — E611 Iron deficiency: Secondary | ICD-10-CM | POA: Diagnosis not present

## 2017-01-08 DIAGNOSIS — L299 Pruritus, unspecified: Secondary | ICD-10-CM | POA: Diagnosis not present

## 2017-01-08 DIAGNOSIS — R7881 Bacteremia: Secondary | ICD-10-CM | POA: Diagnosis not present

## 2017-01-10 DIAGNOSIS — T82898A Other specified complication of vascular prosthetic devices, implants and grafts, initial encounter: Secondary | ICD-10-CM | POA: Diagnosis not present

## 2017-01-10 DIAGNOSIS — N186 End stage renal disease: Secondary | ICD-10-CM | POA: Diagnosis not present

## 2017-01-10 DIAGNOSIS — L299 Pruritus, unspecified: Secondary | ICD-10-CM | POA: Diagnosis not present

## 2017-01-10 DIAGNOSIS — N2581 Secondary hyperparathyroidism of renal origin: Secondary | ICD-10-CM | POA: Diagnosis not present

## 2017-01-10 DIAGNOSIS — R7881 Bacteremia: Secondary | ICD-10-CM | POA: Diagnosis not present

## 2017-01-10 DIAGNOSIS — Z0000 Encounter for general adult medical examination without abnormal findings: Secondary | ICD-10-CM | POA: Diagnosis not present

## 2017-01-10 DIAGNOSIS — E611 Iron deficiency: Secondary | ICD-10-CM | POA: Diagnosis not present

## 2017-01-11 DIAGNOSIS — T82898A Other specified complication of vascular prosthetic devices, implants and grafts, initial encounter: Secondary | ICD-10-CM | POA: Diagnosis not present

## 2017-01-11 DIAGNOSIS — N186 End stage renal disease: Secondary | ICD-10-CM | POA: Diagnosis not present

## 2017-01-11 DIAGNOSIS — L299 Pruritus, unspecified: Secondary | ICD-10-CM | POA: Diagnosis not present

## 2017-01-11 DIAGNOSIS — E611 Iron deficiency: Secondary | ICD-10-CM | POA: Diagnosis not present

## 2017-01-11 DIAGNOSIS — N2581 Secondary hyperparathyroidism of renal origin: Secondary | ICD-10-CM | POA: Diagnosis not present

## 2017-01-11 DIAGNOSIS — R7881 Bacteremia: Secondary | ICD-10-CM | POA: Diagnosis not present

## 2017-01-12 DIAGNOSIS — T82858A Stenosis of vascular prosthetic devices, implants and grafts, initial encounter: Secondary | ICD-10-CM | POA: Diagnosis not present

## 2017-01-12 DIAGNOSIS — I871 Compression of vein: Secondary | ICD-10-CM | POA: Diagnosis not present

## 2017-01-13 DIAGNOSIS — L299 Pruritus, unspecified: Secondary | ICD-10-CM | POA: Diagnosis not present

## 2017-01-13 DIAGNOSIS — N186 End stage renal disease: Secondary | ICD-10-CM | POA: Diagnosis not present

## 2017-01-13 DIAGNOSIS — T82898A Other specified complication of vascular prosthetic devices, implants and grafts, initial encounter: Secondary | ICD-10-CM | POA: Diagnosis not present

## 2017-01-13 DIAGNOSIS — N2581 Secondary hyperparathyroidism of renal origin: Secondary | ICD-10-CM | POA: Diagnosis not present

## 2017-01-13 DIAGNOSIS — R7881 Bacteremia: Secondary | ICD-10-CM | POA: Diagnosis not present

## 2017-01-13 DIAGNOSIS — E611 Iron deficiency: Secondary | ICD-10-CM | POA: Diagnosis not present

## 2017-01-14 DIAGNOSIS — N2581 Secondary hyperparathyroidism of renal origin: Secondary | ICD-10-CM | POA: Diagnosis not present

## 2017-01-14 DIAGNOSIS — L299 Pruritus, unspecified: Secondary | ICD-10-CM | POA: Diagnosis not present

## 2017-01-14 DIAGNOSIS — R7881 Bacteremia: Secondary | ICD-10-CM | POA: Diagnosis not present

## 2017-01-14 DIAGNOSIS — T82898A Other specified complication of vascular prosthetic devices, implants and grafts, initial encounter: Secondary | ICD-10-CM | POA: Diagnosis not present

## 2017-01-14 DIAGNOSIS — E611 Iron deficiency: Secondary | ICD-10-CM | POA: Diagnosis not present

## 2017-01-14 DIAGNOSIS — N186 End stage renal disease: Secondary | ICD-10-CM | POA: Diagnosis not present

## 2017-01-18 DIAGNOSIS — T82898A Other specified complication of vascular prosthetic devices, implants and grafts, initial encounter: Secondary | ICD-10-CM | POA: Diagnosis not present

## 2017-01-18 DIAGNOSIS — N2581 Secondary hyperparathyroidism of renal origin: Secondary | ICD-10-CM | POA: Diagnosis not present

## 2017-01-18 DIAGNOSIS — R7881 Bacteremia: Secondary | ICD-10-CM | POA: Diagnosis not present

## 2017-01-18 DIAGNOSIS — E611 Iron deficiency: Secondary | ICD-10-CM | POA: Diagnosis not present

## 2017-01-18 DIAGNOSIS — N186 End stage renal disease: Secondary | ICD-10-CM | POA: Diagnosis not present

## 2017-01-18 DIAGNOSIS — L299 Pruritus, unspecified: Secondary | ICD-10-CM | POA: Diagnosis not present

## 2017-01-20 DIAGNOSIS — E611 Iron deficiency: Secondary | ICD-10-CM | POA: Diagnosis not present

## 2017-01-20 DIAGNOSIS — R7881 Bacteremia: Secondary | ICD-10-CM | POA: Diagnosis not present

## 2017-01-20 DIAGNOSIS — T82898A Other specified complication of vascular prosthetic devices, implants and grafts, initial encounter: Secondary | ICD-10-CM | POA: Diagnosis not present

## 2017-01-20 DIAGNOSIS — N186 End stage renal disease: Secondary | ICD-10-CM | POA: Diagnosis not present

## 2017-01-20 DIAGNOSIS — N2581 Secondary hyperparathyroidism of renal origin: Secondary | ICD-10-CM | POA: Diagnosis not present

## 2017-01-20 DIAGNOSIS — L299 Pruritus, unspecified: Secondary | ICD-10-CM | POA: Diagnosis not present

## 2017-01-21 DIAGNOSIS — Z992 Dependence on renal dialysis: Secondary | ICD-10-CM | POA: Diagnosis not present

## 2017-01-21 DIAGNOSIS — N186 End stage renal disease: Secondary | ICD-10-CM | POA: Diagnosis not present

## 2017-01-22 DIAGNOSIS — E611 Iron deficiency: Secondary | ICD-10-CM | POA: Diagnosis not present

## 2017-01-22 DIAGNOSIS — B9561 Methicillin susceptible Staphylococcus aureus infection as the cause of diseases classified elsewhere: Secondary | ICD-10-CM | POA: Diagnosis not present

## 2017-01-22 DIAGNOSIS — D631 Anemia in chronic kidney disease: Secondary | ICD-10-CM | POA: Diagnosis not present

## 2017-01-22 DIAGNOSIS — N2581 Secondary hyperparathyroidism of renal origin: Secondary | ICD-10-CM | POA: Diagnosis not present

## 2017-01-22 DIAGNOSIS — R7881 Bacteremia: Secondary | ICD-10-CM | POA: Diagnosis not present

## 2017-01-22 DIAGNOSIS — Z992 Dependence on renal dialysis: Secondary | ICD-10-CM | POA: Diagnosis not present

## 2017-01-22 DIAGNOSIS — N186 End stage renal disease: Secondary | ICD-10-CM | POA: Diagnosis not present

## 2017-01-25 DIAGNOSIS — N186 End stage renal disease: Secondary | ICD-10-CM | POA: Diagnosis not present

## 2017-01-25 DIAGNOSIS — B9561 Methicillin susceptible Staphylococcus aureus infection as the cause of diseases classified elsewhere: Secondary | ICD-10-CM | POA: Diagnosis not present

## 2017-01-25 DIAGNOSIS — D631 Anemia in chronic kidney disease: Secondary | ICD-10-CM | POA: Diagnosis not present

## 2017-01-25 DIAGNOSIS — R7881 Bacteremia: Secondary | ICD-10-CM | POA: Diagnosis not present

## 2017-01-25 DIAGNOSIS — N2581 Secondary hyperparathyroidism of renal origin: Secondary | ICD-10-CM | POA: Diagnosis not present

## 2017-01-25 DIAGNOSIS — E611 Iron deficiency: Secondary | ICD-10-CM | POA: Diagnosis not present

## 2017-01-27 DIAGNOSIS — R7881 Bacteremia: Secondary | ICD-10-CM | POA: Diagnosis not present

## 2017-01-27 DIAGNOSIS — D631 Anemia in chronic kidney disease: Secondary | ICD-10-CM | POA: Diagnosis not present

## 2017-01-27 DIAGNOSIS — B9561 Methicillin susceptible Staphylococcus aureus infection as the cause of diseases classified elsewhere: Secondary | ICD-10-CM | POA: Diagnosis not present

## 2017-01-27 DIAGNOSIS — N2581 Secondary hyperparathyroidism of renal origin: Secondary | ICD-10-CM | POA: Diagnosis not present

## 2017-01-27 DIAGNOSIS — E611 Iron deficiency: Secondary | ICD-10-CM | POA: Diagnosis not present

## 2017-01-27 DIAGNOSIS — N186 End stage renal disease: Secondary | ICD-10-CM | POA: Diagnosis not present

## 2017-01-29 DIAGNOSIS — R7881 Bacteremia: Secondary | ICD-10-CM | POA: Diagnosis not present

## 2017-01-29 DIAGNOSIS — D631 Anemia in chronic kidney disease: Secondary | ICD-10-CM | POA: Diagnosis not present

## 2017-01-29 DIAGNOSIS — N186 End stage renal disease: Secondary | ICD-10-CM | POA: Diagnosis not present

## 2017-01-29 DIAGNOSIS — E611 Iron deficiency: Secondary | ICD-10-CM | POA: Diagnosis not present

## 2017-01-29 DIAGNOSIS — N2581 Secondary hyperparathyroidism of renal origin: Secondary | ICD-10-CM | POA: Diagnosis not present

## 2017-01-29 DIAGNOSIS — B9561 Methicillin susceptible Staphylococcus aureus infection as the cause of diseases classified elsewhere: Secondary | ICD-10-CM | POA: Diagnosis not present

## 2017-02-01 DIAGNOSIS — B9561 Methicillin susceptible Staphylococcus aureus infection as the cause of diseases classified elsewhere: Secondary | ICD-10-CM | POA: Diagnosis not present

## 2017-02-01 DIAGNOSIS — D631 Anemia in chronic kidney disease: Secondary | ICD-10-CM | POA: Diagnosis not present

## 2017-02-01 DIAGNOSIS — E611 Iron deficiency: Secondary | ICD-10-CM | POA: Diagnosis not present

## 2017-02-01 DIAGNOSIS — N2581 Secondary hyperparathyroidism of renal origin: Secondary | ICD-10-CM | POA: Diagnosis not present

## 2017-02-01 DIAGNOSIS — Z992 Dependence on renal dialysis: Secondary | ICD-10-CM | POA: Diagnosis not present

## 2017-02-01 DIAGNOSIS — R7881 Bacteremia: Secondary | ICD-10-CM | POA: Diagnosis not present

## 2017-02-01 DIAGNOSIS — N186 End stage renal disease: Secondary | ICD-10-CM | POA: Diagnosis not present

## 2017-02-03 DIAGNOSIS — D631 Anemia in chronic kidney disease: Secondary | ICD-10-CM | POA: Diagnosis not present

## 2017-02-03 DIAGNOSIS — E611 Iron deficiency: Secondary | ICD-10-CM | POA: Diagnosis not present

## 2017-02-03 DIAGNOSIS — N2581 Secondary hyperparathyroidism of renal origin: Secondary | ICD-10-CM | POA: Diagnosis not present

## 2017-02-03 DIAGNOSIS — R7881 Bacteremia: Secondary | ICD-10-CM | POA: Diagnosis not present

## 2017-02-03 DIAGNOSIS — N186 End stage renal disease: Secondary | ICD-10-CM | POA: Diagnosis not present

## 2017-02-03 DIAGNOSIS — B9561 Methicillin susceptible Staphylococcus aureus infection as the cause of diseases classified elsewhere: Secondary | ICD-10-CM | POA: Diagnosis not present

## 2017-02-05 DIAGNOSIS — N186 End stage renal disease: Secondary | ICD-10-CM | POA: Diagnosis not present

## 2017-02-05 DIAGNOSIS — N2581 Secondary hyperparathyroidism of renal origin: Secondary | ICD-10-CM | POA: Diagnosis not present

## 2017-02-05 DIAGNOSIS — B9561 Methicillin susceptible Staphylococcus aureus infection as the cause of diseases classified elsewhere: Secondary | ICD-10-CM | POA: Diagnosis not present

## 2017-02-05 DIAGNOSIS — E611 Iron deficiency: Secondary | ICD-10-CM | POA: Diagnosis not present

## 2017-02-05 DIAGNOSIS — D631 Anemia in chronic kidney disease: Secondary | ICD-10-CM | POA: Diagnosis not present

## 2017-02-05 DIAGNOSIS — R7881 Bacteremia: Secondary | ICD-10-CM | POA: Diagnosis not present

## 2017-02-08 DIAGNOSIS — R7881 Bacteremia: Secondary | ICD-10-CM | POA: Diagnosis not present

## 2017-02-08 DIAGNOSIS — D631 Anemia in chronic kidney disease: Secondary | ICD-10-CM | POA: Diagnosis not present

## 2017-02-08 DIAGNOSIS — E611 Iron deficiency: Secondary | ICD-10-CM | POA: Diagnosis not present

## 2017-02-08 DIAGNOSIS — B9561 Methicillin susceptible Staphylococcus aureus infection as the cause of diseases classified elsewhere: Secondary | ICD-10-CM | POA: Diagnosis not present

## 2017-02-08 DIAGNOSIS — N2581 Secondary hyperparathyroidism of renal origin: Secondary | ICD-10-CM | POA: Diagnosis not present

## 2017-02-08 DIAGNOSIS — N186 End stage renal disease: Secondary | ICD-10-CM | POA: Diagnosis not present

## 2017-02-10 DIAGNOSIS — N2581 Secondary hyperparathyroidism of renal origin: Secondary | ICD-10-CM | POA: Diagnosis not present

## 2017-02-10 DIAGNOSIS — E611 Iron deficiency: Secondary | ICD-10-CM | POA: Diagnosis not present

## 2017-02-10 DIAGNOSIS — B9561 Methicillin susceptible Staphylococcus aureus infection as the cause of diseases classified elsewhere: Secondary | ICD-10-CM | POA: Diagnosis not present

## 2017-02-10 DIAGNOSIS — N186 End stage renal disease: Secondary | ICD-10-CM | POA: Diagnosis not present

## 2017-02-10 DIAGNOSIS — R7881 Bacteremia: Secondary | ICD-10-CM | POA: Diagnosis not present

## 2017-02-10 DIAGNOSIS — D631 Anemia in chronic kidney disease: Secondary | ICD-10-CM | POA: Diagnosis not present

## 2017-02-12 DIAGNOSIS — B9561 Methicillin susceptible Staphylococcus aureus infection as the cause of diseases classified elsewhere: Secondary | ICD-10-CM | POA: Diagnosis not present

## 2017-02-12 DIAGNOSIS — R7881 Bacteremia: Secondary | ICD-10-CM | POA: Diagnosis not present

## 2017-02-12 DIAGNOSIS — D631 Anemia in chronic kidney disease: Secondary | ICD-10-CM | POA: Diagnosis not present

## 2017-02-12 DIAGNOSIS — N186 End stage renal disease: Secondary | ICD-10-CM | POA: Diagnosis not present

## 2017-02-12 DIAGNOSIS — E611 Iron deficiency: Secondary | ICD-10-CM | POA: Diagnosis not present

## 2017-02-12 DIAGNOSIS — N2581 Secondary hyperparathyroidism of renal origin: Secondary | ICD-10-CM | POA: Diagnosis not present

## 2017-02-15 DIAGNOSIS — N2581 Secondary hyperparathyroidism of renal origin: Secondary | ICD-10-CM | POA: Diagnosis not present

## 2017-02-15 DIAGNOSIS — E611 Iron deficiency: Secondary | ICD-10-CM | POA: Diagnosis not present

## 2017-02-15 DIAGNOSIS — D631 Anemia in chronic kidney disease: Secondary | ICD-10-CM | POA: Diagnosis not present

## 2017-02-15 DIAGNOSIS — R7881 Bacteremia: Secondary | ICD-10-CM | POA: Diagnosis not present

## 2017-02-15 DIAGNOSIS — N186 End stage renal disease: Secondary | ICD-10-CM | POA: Diagnosis not present

## 2017-02-15 DIAGNOSIS — B9561 Methicillin susceptible Staphylococcus aureus infection as the cause of diseases classified elsewhere: Secondary | ICD-10-CM | POA: Diagnosis not present

## 2017-02-17 DIAGNOSIS — N2581 Secondary hyperparathyroidism of renal origin: Secondary | ICD-10-CM | POA: Diagnosis not present

## 2017-02-17 DIAGNOSIS — B9561 Methicillin susceptible Staphylococcus aureus infection as the cause of diseases classified elsewhere: Secondary | ICD-10-CM | POA: Diagnosis not present

## 2017-02-17 DIAGNOSIS — D631 Anemia in chronic kidney disease: Secondary | ICD-10-CM | POA: Diagnosis not present

## 2017-02-17 DIAGNOSIS — N186 End stage renal disease: Secondary | ICD-10-CM | POA: Diagnosis not present

## 2017-02-17 DIAGNOSIS — E611 Iron deficiency: Secondary | ICD-10-CM | POA: Diagnosis not present

## 2017-02-17 DIAGNOSIS — R7881 Bacteremia: Secondary | ICD-10-CM | POA: Diagnosis not present

## 2017-02-19 DIAGNOSIS — R7881 Bacteremia: Secondary | ICD-10-CM | POA: Diagnosis not present

## 2017-02-19 DIAGNOSIS — B9561 Methicillin susceptible Staphylococcus aureus infection as the cause of diseases classified elsewhere: Secondary | ICD-10-CM | POA: Diagnosis not present

## 2017-02-19 DIAGNOSIS — E611 Iron deficiency: Secondary | ICD-10-CM | POA: Diagnosis not present

## 2017-02-19 DIAGNOSIS — N186 End stage renal disease: Secondary | ICD-10-CM | POA: Diagnosis not present

## 2017-02-19 DIAGNOSIS — D631 Anemia in chronic kidney disease: Secondary | ICD-10-CM | POA: Diagnosis not present

## 2017-02-19 DIAGNOSIS — N2581 Secondary hyperparathyroidism of renal origin: Secondary | ICD-10-CM | POA: Diagnosis not present

## 2017-02-20 DIAGNOSIS — N186 End stage renal disease: Secondary | ICD-10-CM | POA: Diagnosis not present

## 2017-02-20 DIAGNOSIS — Z992 Dependence on renal dialysis: Secondary | ICD-10-CM | POA: Diagnosis not present

## 2017-02-22 DIAGNOSIS — L299 Pruritus, unspecified: Secondary | ICD-10-CM | POA: Diagnosis not present

## 2017-02-22 DIAGNOSIS — Z992 Dependence on renal dialysis: Secondary | ICD-10-CM | POA: Diagnosis not present

## 2017-02-22 DIAGNOSIS — D631 Anemia in chronic kidney disease: Secondary | ICD-10-CM | POA: Diagnosis not present

## 2017-02-22 DIAGNOSIS — N186 End stage renal disease: Secondary | ICD-10-CM | POA: Diagnosis not present

## 2017-02-22 DIAGNOSIS — D509 Iron deficiency anemia, unspecified: Secondary | ICD-10-CM | POA: Diagnosis not present

## 2017-02-22 DIAGNOSIS — Z23 Encounter for immunization: Secondary | ICD-10-CM | POA: Diagnosis not present

## 2017-02-22 DIAGNOSIS — N2581 Secondary hyperparathyroidism of renal origin: Secondary | ICD-10-CM | POA: Diagnosis not present

## 2017-02-24 DIAGNOSIS — D631 Anemia in chronic kidney disease: Secondary | ICD-10-CM | POA: Diagnosis not present

## 2017-02-24 DIAGNOSIS — D509 Iron deficiency anemia, unspecified: Secondary | ICD-10-CM | POA: Diagnosis not present

## 2017-02-24 DIAGNOSIS — N186 End stage renal disease: Secondary | ICD-10-CM | POA: Diagnosis not present

## 2017-02-24 DIAGNOSIS — Z23 Encounter for immunization: Secondary | ICD-10-CM | POA: Diagnosis not present

## 2017-02-24 DIAGNOSIS — L299 Pruritus, unspecified: Secondary | ICD-10-CM | POA: Diagnosis not present

## 2017-02-24 DIAGNOSIS — N2581 Secondary hyperparathyroidism of renal origin: Secondary | ICD-10-CM | POA: Diagnosis not present

## 2017-02-26 DIAGNOSIS — Z23 Encounter for immunization: Secondary | ICD-10-CM | POA: Diagnosis not present

## 2017-02-26 DIAGNOSIS — N2581 Secondary hyperparathyroidism of renal origin: Secondary | ICD-10-CM | POA: Diagnosis not present

## 2017-02-26 DIAGNOSIS — D631 Anemia in chronic kidney disease: Secondary | ICD-10-CM | POA: Diagnosis not present

## 2017-02-26 DIAGNOSIS — N186 End stage renal disease: Secondary | ICD-10-CM | POA: Diagnosis not present

## 2017-02-26 DIAGNOSIS — L299 Pruritus, unspecified: Secondary | ICD-10-CM | POA: Diagnosis not present

## 2017-02-26 DIAGNOSIS — D509 Iron deficiency anemia, unspecified: Secondary | ICD-10-CM | POA: Diagnosis not present

## 2017-03-01 DIAGNOSIS — N2581 Secondary hyperparathyroidism of renal origin: Secondary | ICD-10-CM | POA: Diagnosis not present

## 2017-03-01 DIAGNOSIS — N186 End stage renal disease: Secondary | ICD-10-CM | POA: Diagnosis not present

## 2017-03-01 DIAGNOSIS — D631 Anemia in chronic kidney disease: Secondary | ICD-10-CM | POA: Diagnosis not present

## 2017-03-01 DIAGNOSIS — L299 Pruritus, unspecified: Secondary | ICD-10-CM | POA: Diagnosis not present

## 2017-03-01 DIAGNOSIS — Z992 Dependence on renal dialysis: Secondary | ICD-10-CM | POA: Diagnosis not present

## 2017-03-01 DIAGNOSIS — D509 Iron deficiency anemia, unspecified: Secondary | ICD-10-CM | POA: Diagnosis not present

## 2017-03-01 DIAGNOSIS — Z23 Encounter for immunization: Secondary | ICD-10-CM | POA: Diagnosis not present

## 2017-03-03 DIAGNOSIS — L299 Pruritus, unspecified: Secondary | ICD-10-CM | POA: Diagnosis not present

## 2017-03-03 DIAGNOSIS — D509 Iron deficiency anemia, unspecified: Secondary | ICD-10-CM | POA: Diagnosis not present

## 2017-03-03 DIAGNOSIS — N186 End stage renal disease: Secondary | ICD-10-CM | POA: Diagnosis not present

## 2017-03-03 DIAGNOSIS — Z23 Encounter for immunization: Secondary | ICD-10-CM | POA: Diagnosis not present

## 2017-03-03 DIAGNOSIS — N2581 Secondary hyperparathyroidism of renal origin: Secondary | ICD-10-CM | POA: Diagnosis not present

## 2017-03-03 DIAGNOSIS — D631 Anemia in chronic kidney disease: Secondary | ICD-10-CM | POA: Diagnosis not present

## 2017-03-06 DIAGNOSIS — N2581 Secondary hyperparathyroidism of renal origin: Secondary | ICD-10-CM | POA: Diagnosis not present

## 2017-03-06 DIAGNOSIS — N186 End stage renal disease: Secondary | ICD-10-CM | POA: Diagnosis not present

## 2017-03-06 DIAGNOSIS — D509 Iron deficiency anemia, unspecified: Secondary | ICD-10-CM | POA: Diagnosis not present

## 2017-03-06 DIAGNOSIS — Z23 Encounter for immunization: Secondary | ICD-10-CM | POA: Diagnosis not present

## 2017-03-06 DIAGNOSIS — D631 Anemia in chronic kidney disease: Secondary | ICD-10-CM | POA: Diagnosis not present

## 2017-03-06 DIAGNOSIS — L299 Pruritus, unspecified: Secondary | ICD-10-CM | POA: Diagnosis not present

## 2017-03-08 DIAGNOSIS — D509 Iron deficiency anemia, unspecified: Secondary | ICD-10-CM | POA: Diagnosis not present

## 2017-03-08 DIAGNOSIS — N2581 Secondary hyperparathyroidism of renal origin: Secondary | ICD-10-CM | POA: Diagnosis not present

## 2017-03-08 DIAGNOSIS — N186 End stage renal disease: Secondary | ICD-10-CM | POA: Diagnosis not present

## 2017-03-08 DIAGNOSIS — Z23 Encounter for immunization: Secondary | ICD-10-CM | POA: Diagnosis not present

## 2017-03-08 DIAGNOSIS — L299 Pruritus, unspecified: Secondary | ICD-10-CM | POA: Diagnosis not present

## 2017-03-08 DIAGNOSIS — D631 Anemia in chronic kidney disease: Secondary | ICD-10-CM | POA: Diagnosis not present

## 2017-03-10 DIAGNOSIS — L299 Pruritus, unspecified: Secondary | ICD-10-CM | POA: Diagnosis not present

## 2017-03-10 DIAGNOSIS — Z23 Encounter for immunization: Secondary | ICD-10-CM | POA: Diagnosis not present

## 2017-03-10 DIAGNOSIS — N2581 Secondary hyperparathyroidism of renal origin: Secondary | ICD-10-CM | POA: Diagnosis not present

## 2017-03-10 DIAGNOSIS — D509 Iron deficiency anemia, unspecified: Secondary | ICD-10-CM | POA: Diagnosis not present

## 2017-03-10 DIAGNOSIS — N186 End stage renal disease: Secondary | ICD-10-CM | POA: Diagnosis not present

## 2017-03-10 DIAGNOSIS — D631 Anemia in chronic kidney disease: Secondary | ICD-10-CM | POA: Diagnosis not present

## 2017-03-11 DIAGNOSIS — D631 Anemia in chronic kidney disease: Secondary | ICD-10-CM | POA: Diagnosis not present

## 2017-03-11 DIAGNOSIS — N2581 Secondary hyperparathyroidism of renal origin: Secondary | ICD-10-CM | POA: Diagnosis not present

## 2017-03-11 DIAGNOSIS — Z23 Encounter for immunization: Secondary | ICD-10-CM | POA: Diagnosis not present

## 2017-03-11 DIAGNOSIS — D509 Iron deficiency anemia, unspecified: Secondary | ICD-10-CM | POA: Diagnosis not present

## 2017-03-11 DIAGNOSIS — L299 Pruritus, unspecified: Secondary | ICD-10-CM | POA: Diagnosis not present

## 2017-03-11 DIAGNOSIS — N186 End stage renal disease: Secondary | ICD-10-CM | POA: Diagnosis not present

## 2017-03-12 DIAGNOSIS — L299 Pruritus, unspecified: Secondary | ICD-10-CM | POA: Diagnosis not present

## 2017-03-12 DIAGNOSIS — D509 Iron deficiency anemia, unspecified: Secondary | ICD-10-CM | POA: Diagnosis not present

## 2017-03-12 DIAGNOSIS — N186 End stage renal disease: Secondary | ICD-10-CM | POA: Diagnosis not present

## 2017-03-12 DIAGNOSIS — D631 Anemia in chronic kidney disease: Secondary | ICD-10-CM | POA: Diagnosis not present

## 2017-03-12 DIAGNOSIS — N2581 Secondary hyperparathyroidism of renal origin: Secondary | ICD-10-CM | POA: Diagnosis not present

## 2017-03-12 DIAGNOSIS — Z23 Encounter for immunization: Secondary | ICD-10-CM | POA: Diagnosis not present

## 2017-03-15 DIAGNOSIS — D631 Anemia in chronic kidney disease: Secondary | ICD-10-CM | POA: Diagnosis not present

## 2017-03-15 DIAGNOSIS — L299 Pruritus, unspecified: Secondary | ICD-10-CM | POA: Diagnosis not present

## 2017-03-15 DIAGNOSIS — N186 End stage renal disease: Secondary | ICD-10-CM | POA: Diagnosis not present

## 2017-03-15 DIAGNOSIS — Z23 Encounter for immunization: Secondary | ICD-10-CM | POA: Diagnosis not present

## 2017-03-15 DIAGNOSIS — N2581 Secondary hyperparathyroidism of renal origin: Secondary | ICD-10-CM | POA: Diagnosis not present

## 2017-03-15 DIAGNOSIS — D509 Iron deficiency anemia, unspecified: Secondary | ICD-10-CM | POA: Diagnosis not present

## 2017-03-17 DIAGNOSIS — N2581 Secondary hyperparathyroidism of renal origin: Secondary | ICD-10-CM | POA: Diagnosis not present

## 2017-03-17 DIAGNOSIS — D631 Anemia in chronic kidney disease: Secondary | ICD-10-CM | POA: Diagnosis not present

## 2017-03-17 DIAGNOSIS — N186 End stage renal disease: Secondary | ICD-10-CM | POA: Diagnosis not present

## 2017-03-17 DIAGNOSIS — D509 Iron deficiency anemia, unspecified: Secondary | ICD-10-CM | POA: Diagnosis not present

## 2017-03-17 DIAGNOSIS — L299 Pruritus, unspecified: Secondary | ICD-10-CM | POA: Diagnosis not present

## 2017-03-17 DIAGNOSIS — Z23 Encounter for immunization: Secondary | ICD-10-CM | POA: Diagnosis not present

## 2017-03-19 DIAGNOSIS — Z23 Encounter for immunization: Secondary | ICD-10-CM | POA: Diagnosis not present

## 2017-03-19 DIAGNOSIS — N186 End stage renal disease: Secondary | ICD-10-CM | POA: Diagnosis not present

## 2017-03-19 DIAGNOSIS — N2581 Secondary hyperparathyroidism of renal origin: Secondary | ICD-10-CM | POA: Diagnosis not present

## 2017-03-19 DIAGNOSIS — D631 Anemia in chronic kidney disease: Secondary | ICD-10-CM | POA: Diagnosis not present

## 2017-03-19 DIAGNOSIS — L299 Pruritus, unspecified: Secondary | ICD-10-CM | POA: Diagnosis not present

## 2017-03-19 DIAGNOSIS — D509 Iron deficiency anemia, unspecified: Secondary | ICD-10-CM | POA: Diagnosis not present

## 2017-03-22 DIAGNOSIS — N2581 Secondary hyperparathyroidism of renal origin: Secondary | ICD-10-CM | POA: Diagnosis not present

## 2017-03-22 DIAGNOSIS — D631 Anemia in chronic kidney disease: Secondary | ICD-10-CM | POA: Diagnosis not present

## 2017-03-22 DIAGNOSIS — D509 Iron deficiency anemia, unspecified: Secondary | ICD-10-CM | POA: Diagnosis not present

## 2017-03-22 DIAGNOSIS — Z23 Encounter for immunization: Secondary | ICD-10-CM | POA: Diagnosis not present

## 2017-03-22 DIAGNOSIS — L299 Pruritus, unspecified: Secondary | ICD-10-CM | POA: Diagnosis not present

## 2017-03-22 DIAGNOSIS — N186 End stage renal disease: Secondary | ICD-10-CM | POA: Diagnosis not present

## 2017-03-23 DIAGNOSIS — Z992 Dependence on renal dialysis: Secondary | ICD-10-CM | POA: Diagnosis not present

## 2017-03-23 DIAGNOSIS — N186 End stage renal disease: Secondary | ICD-10-CM | POA: Diagnosis not present

## 2017-03-24 DIAGNOSIS — D631 Anemia in chronic kidney disease: Secondary | ICD-10-CM | POA: Diagnosis not present

## 2017-03-24 DIAGNOSIS — N186 End stage renal disease: Secondary | ICD-10-CM | POA: Diagnosis not present

## 2017-03-24 DIAGNOSIS — E611 Iron deficiency: Secondary | ICD-10-CM | POA: Diagnosis not present

## 2017-03-24 DIAGNOSIS — N2581 Secondary hyperparathyroidism of renal origin: Secondary | ICD-10-CM | POA: Diagnosis not present

## 2017-03-24 DIAGNOSIS — Z992 Dependence on renal dialysis: Secondary | ICD-10-CM | POA: Diagnosis not present

## 2017-03-25 DIAGNOSIS — E611 Iron deficiency: Secondary | ICD-10-CM | POA: Diagnosis not present

## 2017-03-25 DIAGNOSIS — N186 End stage renal disease: Secondary | ICD-10-CM | POA: Diagnosis not present

## 2017-03-25 DIAGNOSIS — D631 Anemia in chronic kidney disease: Secondary | ICD-10-CM | POA: Diagnosis not present

## 2017-03-25 DIAGNOSIS — N2581 Secondary hyperparathyroidism of renal origin: Secondary | ICD-10-CM | POA: Diagnosis not present

## 2017-03-25 DIAGNOSIS — Z992 Dependence on renal dialysis: Secondary | ICD-10-CM | POA: Diagnosis not present

## 2017-03-29 DIAGNOSIS — N2581 Secondary hyperparathyroidism of renal origin: Secondary | ICD-10-CM | POA: Diagnosis not present

## 2017-03-29 DIAGNOSIS — D631 Anemia in chronic kidney disease: Secondary | ICD-10-CM | POA: Diagnosis not present

## 2017-03-29 DIAGNOSIS — E611 Iron deficiency: Secondary | ICD-10-CM | POA: Diagnosis not present

## 2017-03-29 DIAGNOSIS — N186 End stage renal disease: Secondary | ICD-10-CM | POA: Diagnosis not present

## 2017-03-29 DIAGNOSIS — Z992 Dependence on renal dialysis: Secondary | ICD-10-CM | POA: Diagnosis not present

## 2017-03-31 DIAGNOSIS — N2581 Secondary hyperparathyroidism of renal origin: Secondary | ICD-10-CM | POA: Diagnosis not present

## 2017-03-31 DIAGNOSIS — Z992 Dependence on renal dialysis: Secondary | ICD-10-CM | POA: Diagnosis not present

## 2017-03-31 DIAGNOSIS — E611 Iron deficiency: Secondary | ICD-10-CM | POA: Diagnosis not present

## 2017-03-31 DIAGNOSIS — D631 Anemia in chronic kidney disease: Secondary | ICD-10-CM | POA: Diagnosis not present

## 2017-03-31 DIAGNOSIS — N186 End stage renal disease: Secondary | ICD-10-CM | POA: Diagnosis not present

## 2017-04-02 DIAGNOSIS — Z992 Dependence on renal dialysis: Secondary | ICD-10-CM | POA: Diagnosis not present

## 2017-04-02 DIAGNOSIS — E611 Iron deficiency: Secondary | ICD-10-CM | POA: Diagnosis not present

## 2017-04-02 DIAGNOSIS — N186 End stage renal disease: Secondary | ICD-10-CM | POA: Diagnosis not present

## 2017-04-02 DIAGNOSIS — D631 Anemia in chronic kidney disease: Secondary | ICD-10-CM | POA: Diagnosis not present

## 2017-04-02 DIAGNOSIS — N2581 Secondary hyperparathyroidism of renal origin: Secondary | ICD-10-CM | POA: Diagnosis not present

## 2017-04-05 DIAGNOSIS — N186 End stage renal disease: Secondary | ICD-10-CM | POA: Diagnosis not present

## 2017-04-05 DIAGNOSIS — Z992 Dependence on renal dialysis: Secondary | ICD-10-CM | POA: Diagnosis not present

## 2017-04-05 DIAGNOSIS — N2581 Secondary hyperparathyroidism of renal origin: Secondary | ICD-10-CM | POA: Diagnosis not present

## 2017-04-05 DIAGNOSIS — E611 Iron deficiency: Secondary | ICD-10-CM | POA: Diagnosis not present

## 2017-04-05 DIAGNOSIS — D631 Anemia in chronic kidney disease: Secondary | ICD-10-CM | POA: Diagnosis not present

## 2017-04-07 DIAGNOSIS — N186 End stage renal disease: Secondary | ICD-10-CM | POA: Diagnosis not present

## 2017-04-07 DIAGNOSIS — Z992 Dependence on renal dialysis: Secondary | ICD-10-CM | POA: Diagnosis not present

## 2017-04-07 DIAGNOSIS — N2581 Secondary hyperparathyroidism of renal origin: Secondary | ICD-10-CM | POA: Diagnosis not present

## 2017-04-07 DIAGNOSIS — D631 Anemia in chronic kidney disease: Secondary | ICD-10-CM | POA: Diagnosis not present

## 2017-04-07 DIAGNOSIS — E611 Iron deficiency: Secondary | ICD-10-CM | POA: Diagnosis not present

## 2017-04-09 DIAGNOSIS — Z992 Dependence on renal dialysis: Secondary | ICD-10-CM | POA: Diagnosis not present

## 2017-04-09 DIAGNOSIS — N186 End stage renal disease: Secondary | ICD-10-CM | POA: Diagnosis not present

## 2017-04-09 DIAGNOSIS — E611 Iron deficiency: Secondary | ICD-10-CM | POA: Diagnosis not present

## 2017-04-09 DIAGNOSIS — D631 Anemia in chronic kidney disease: Secondary | ICD-10-CM | POA: Diagnosis not present

## 2017-04-09 DIAGNOSIS — N2581 Secondary hyperparathyroidism of renal origin: Secondary | ICD-10-CM | POA: Diagnosis not present

## 2017-04-12 DIAGNOSIS — N186 End stage renal disease: Secondary | ICD-10-CM | POA: Diagnosis not present

## 2017-04-12 DIAGNOSIS — Z992 Dependence on renal dialysis: Secondary | ICD-10-CM | POA: Diagnosis not present

## 2017-04-12 DIAGNOSIS — N2581 Secondary hyperparathyroidism of renal origin: Secondary | ICD-10-CM | POA: Diagnosis not present

## 2017-04-12 DIAGNOSIS — D631 Anemia in chronic kidney disease: Secondary | ICD-10-CM | POA: Diagnosis not present

## 2017-04-12 DIAGNOSIS — E611 Iron deficiency: Secondary | ICD-10-CM | POA: Diagnosis not present

## 2017-04-14 DIAGNOSIS — N186 End stage renal disease: Secondary | ICD-10-CM | POA: Diagnosis not present

## 2017-04-14 DIAGNOSIS — N2581 Secondary hyperparathyroidism of renal origin: Secondary | ICD-10-CM | POA: Diagnosis not present

## 2017-04-14 DIAGNOSIS — D631 Anemia in chronic kidney disease: Secondary | ICD-10-CM | POA: Diagnosis not present

## 2017-04-14 DIAGNOSIS — Z992 Dependence on renal dialysis: Secondary | ICD-10-CM | POA: Diagnosis not present

## 2017-04-14 DIAGNOSIS — E611 Iron deficiency: Secondary | ICD-10-CM | POA: Diagnosis not present

## 2017-04-16 DIAGNOSIS — N186 End stage renal disease: Secondary | ICD-10-CM | POA: Diagnosis not present

## 2017-04-16 DIAGNOSIS — E611 Iron deficiency: Secondary | ICD-10-CM | POA: Diagnosis not present

## 2017-04-16 DIAGNOSIS — N2581 Secondary hyperparathyroidism of renal origin: Secondary | ICD-10-CM | POA: Diagnosis not present

## 2017-04-16 DIAGNOSIS — D631 Anemia in chronic kidney disease: Secondary | ICD-10-CM | POA: Diagnosis not present

## 2017-04-16 DIAGNOSIS — Z992 Dependence on renal dialysis: Secondary | ICD-10-CM | POA: Diagnosis not present

## 2017-04-19 DIAGNOSIS — N2581 Secondary hyperparathyroidism of renal origin: Secondary | ICD-10-CM | POA: Diagnosis not present

## 2017-04-19 DIAGNOSIS — D631 Anemia in chronic kidney disease: Secondary | ICD-10-CM | POA: Diagnosis not present

## 2017-04-19 DIAGNOSIS — E611 Iron deficiency: Secondary | ICD-10-CM | POA: Diagnosis not present

## 2017-04-19 DIAGNOSIS — N186 End stage renal disease: Secondary | ICD-10-CM | POA: Diagnosis not present

## 2017-04-19 DIAGNOSIS — Z992 Dependence on renal dialysis: Secondary | ICD-10-CM | POA: Diagnosis not present

## 2017-04-21 DIAGNOSIS — Z992 Dependence on renal dialysis: Secondary | ICD-10-CM | POA: Diagnosis not present

## 2017-04-21 DIAGNOSIS — N2581 Secondary hyperparathyroidism of renal origin: Secondary | ICD-10-CM | POA: Diagnosis not present

## 2017-04-21 DIAGNOSIS — E611 Iron deficiency: Secondary | ICD-10-CM | POA: Diagnosis not present

## 2017-04-21 DIAGNOSIS — D631 Anemia in chronic kidney disease: Secondary | ICD-10-CM | POA: Diagnosis not present

## 2017-04-21 DIAGNOSIS — N186 End stage renal disease: Secondary | ICD-10-CM | POA: Diagnosis not present

## 2017-04-22 DIAGNOSIS — N186 End stage renal disease: Secondary | ICD-10-CM | POA: Diagnosis not present

## 2017-04-22 DIAGNOSIS — Z992 Dependence on renal dialysis: Secondary | ICD-10-CM | POA: Diagnosis not present

## 2017-04-23 DIAGNOSIS — N186 End stage renal disease: Secondary | ICD-10-CM | POA: Diagnosis not present

## 2017-04-23 DIAGNOSIS — N2581 Secondary hyperparathyroidism of renal origin: Secondary | ICD-10-CM | POA: Diagnosis not present

## 2017-04-23 DIAGNOSIS — E611 Iron deficiency: Secondary | ICD-10-CM | POA: Diagnosis not present

## 2017-04-23 DIAGNOSIS — D631 Anemia in chronic kidney disease: Secondary | ICD-10-CM | POA: Diagnosis not present

## 2017-04-23 DIAGNOSIS — Z992 Dependence on renal dialysis: Secondary | ICD-10-CM | POA: Diagnosis not present

## 2017-04-26 DIAGNOSIS — N2581 Secondary hyperparathyroidism of renal origin: Secondary | ICD-10-CM | POA: Diagnosis not present

## 2017-04-26 DIAGNOSIS — E611 Iron deficiency: Secondary | ICD-10-CM | POA: Diagnosis not present

## 2017-04-26 DIAGNOSIS — Z992 Dependence on renal dialysis: Secondary | ICD-10-CM | POA: Diagnosis not present

## 2017-04-26 DIAGNOSIS — N186 End stage renal disease: Secondary | ICD-10-CM | POA: Diagnosis not present

## 2017-04-26 DIAGNOSIS — D631 Anemia in chronic kidney disease: Secondary | ICD-10-CM | POA: Diagnosis not present

## 2017-04-28 DIAGNOSIS — D631 Anemia in chronic kidney disease: Secondary | ICD-10-CM | POA: Diagnosis not present

## 2017-04-28 DIAGNOSIS — Z992 Dependence on renal dialysis: Secondary | ICD-10-CM | POA: Diagnosis not present

## 2017-04-28 DIAGNOSIS — N186 End stage renal disease: Secondary | ICD-10-CM | POA: Diagnosis not present

## 2017-04-28 DIAGNOSIS — E611 Iron deficiency: Secondary | ICD-10-CM | POA: Diagnosis not present

## 2017-04-28 DIAGNOSIS — N2581 Secondary hyperparathyroidism of renal origin: Secondary | ICD-10-CM | POA: Diagnosis not present

## 2017-04-30 DIAGNOSIS — E611 Iron deficiency: Secondary | ICD-10-CM | POA: Diagnosis not present

## 2017-04-30 DIAGNOSIS — N186 End stage renal disease: Secondary | ICD-10-CM | POA: Diagnosis not present

## 2017-04-30 DIAGNOSIS — N2581 Secondary hyperparathyroidism of renal origin: Secondary | ICD-10-CM | POA: Diagnosis not present

## 2017-04-30 DIAGNOSIS — D631 Anemia in chronic kidney disease: Secondary | ICD-10-CM | POA: Diagnosis not present

## 2017-04-30 DIAGNOSIS — Z992 Dependence on renal dialysis: Secondary | ICD-10-CM | POA: Diagnosis not present

## 2017-05-03 DIAGNOSIS — N2581 Secondary hyperparathyroidism of renal origin: Secondary | ICD-10-CM | POA: Diagnosis not present

## 2017-05-03 DIAGNOSIS — Z992 Dependence on renal dialysis: Secondary | ICD-10-CM | POA: Diagnosis not present

## 2017-05-03 DIAGNOSIS — E611 Iron deficiency: Secondary | ICD-10-CM | POA: Diagnosis not present

## 2017-05-03 DIAGNOSIS — N186 End stage renal disease: Secondary | ICD-10-CM | POA: Diagnosis not present

## 2017-05-03 DIAGNOSIS — D631 Anemia in chronic kidney disease: Secondary | ICD-10-CM | POA: Diagnosis not present

## 2017-05-05 DIAGNOSIS — Z992 Dependence on renal dialysis: Secondary | ICD-10-CM | POA: Diagnosis not present

## 2017-05-05 DIAGNOSIS — N186 End stage renal disease: Secondary | ICD-10-CM | POA: Diagnosis not present

## 2017-05-05 DIAGNOSIS — D631 Anemia in chronic kidney disease: Secondary | ICD-10-CM | POA: Diagnosis not present

## 2017-05-05 DIAGNOSIS — N2581 Secondary hyperparathyroidism of renal origin: Secondary | ICD-10-CM | POA: Diagnosis not present

## 2017-05-05 DIAGNOSIS — E611 Iron deficiency: Secondary | ICD-10-CM | POA: Diagnosis not present

## 2017-05-07 DIAGNOSIS — Z992 Dependence on renal dialysis: Secondary | ICD-10-CM | POA: Diagnosis not present

## 2017-05-07 DIAGNOSIS — E611 Iron deficiency: Secondary | ICD-10-CM | POA: Diagnosis not present

## 2017-05-07 DIAGNOSIS — D631 Anemia in chronic kidney disease: Secondary | ICD-10-CM | POA: Diagnosis not present

## 2017-05-07 DIAGNOSIS — N186 End stage renal disease: Secondary | ICD-10-CM | POA: Diagnosis not present

## 2017-05-07 DIAGNOSIS — N2581 Secondary hyperparathyroidism of renal origin: Secondary | ICD-10-CM | POA: Diagnosis not present

## 2017-05-09 DIAGNOSIS — R358 Other polyuria: Secondary | ICD-10-CM | POA: Diagnosis not present

## 2017-05-09 DIAGNOSIS — J449 Chronic obstructive pulmonary disease, unspecified: Secondary | ICD-10-CM | POA: Diagnosis not present

## 2017-05-09 DIAGNOSIS — N186 End stage renal disease: Secondary | ICD-10-CM | POA: Diagnosis not present

## 2017-05-09 DIAGNOSIS — I10 Essential (primary) hypertension: Secondary | ICD-10-CM | POA: Diagnosis not present

## 2017-05-10 DIAGNOSIS — D631 Anemia in chronic kidney disease: Secondary | ICD-10-CM | POA: Diagnosis not present

## 2017-05-10 DIAGNOSIS — N2581 Secondary hyperparathyroidism of renal origin: Secondary | ICD-10-CM | POA: Diagnosis not present

## 2017-05-10 DIAGNOSIS — N186 End stage renal disease: Secondary | ICD-10-CM | POA: Diagnosis not present

## 2017-05-10 DIAGNOSIS — Z992 Dependence on renal dialysis: Secondary | ICD-10-CM | POA: Diagnosis not present

## 2017-05-10 DIAGNOSIS — E611 Iron deficiency: Secondary | ICD-10-CM | POA: Diagnosis not present

## 2017-05-12 DIAGNOSIS — N186 End stage renal disease: Secondary | ICD-10-CM | POA: Diagnosis not present

## 2017-05-12 DIAGNOSIS — Z992 Dependence on renal dialysis: Secondary | ICD-10-CM | POA: Diagnosis not present

## 2017-05-12 DIAGNOSIS — E611 Iron deficiency: Secondary | ICD-10-CM | POA: Diagnosis not present

## 2017-05-12 DIAGNOSIS — N2581 Secondary hyperparathyroidism of renal origin: Secondary | ICD-10-CM | POA: Diagnosis not present

## 2017-05-12 DIAGNOSIS — D631 Anemia in chronic kidney disease: Secondary | ICD-10-CM | POA: Diagnosis not present

## 2017-05-14 DIAGNOSIS — E611 Iron deficiency: Secondary | ICD-10-CM | POA: Diagnosis not present

## 2017-05-14 DIAGNOSIS — N186 End stage renal disease: Secondary | ICD-10-CM | POA: Diagnosis not present

## 2017-05-14 DIAGNOSIS — N2581 Secondary hyperparathyroidism of renal origin: Secondary | ICD-10-CM | POA: Diagnosis not present

## 2017-05-14 DIAGNOSIS — D631 Anemia in chronic kidney disease: Secondary | ICD-10-CM | POA: Diagnosis not present

## 2017-05-14 DIAGNOSIS — Z992 Dependence on renal dialysis: Secondary | ICD-10-CM | POA: Diagnosis not present

## 2017-05-16 DIAGNOSIS — E611 Iron deficiency: Secondary | ICD-10-CM | POA: Diagnosis not present

## 2017-05-16 DIAGNOSIS — N186 End stage renal disease: Secondary | ICD-10-CM | POA: Diagnosis not present

## 2017-05-16 DIAGNOSIS — Z992 Dependence on renal dialysis: Secondary | ICD-10-CM | POA: Diagnosis not present

## 2017-05-16 DIAGNOSIS — D631 Anemia in chronic kidney disease: Secondary | ICD-10-CM | POA: Diagnosis not present

## 2017-05-16 DIAGNOSIS — N2581 Secondary hyperparathyroidism of renal origin: Secondary | ICD-10-CM | POA: Diagnosis not present

## 2017-05-19 DIAGNOSIS — N186 End stage renal disease: Secondary | ICD-10-CM | POA: Diagnosis not present

## 2017-05-19 DIAGNOSIS — Z992 Dependence on renal dialysis: Secondary | ICD-10-CM | POA: Diagnosis not present

## 2017-05-19 DIAGNOSIS — D631 Anemia in chronic kidney disease: Secondary | ICD-10-CM | POA: Diagnosis not present

## 2017-05-19 DIAGNOSIS — N2581 Secondary hyperparathyroidism of renal origin: Secondary | ICD-10-CM | POA: Diagnosis not present

## 2017-05-19 DIAGNOSIS — E611 Iron deficiency: Secondary | ICD-10-CM | POA: Diagnosis not present

## 2017-05-21 DIAGNOSIS — D631 Anemia in chronic kidney disease: Secondary | ICD-10-CM | POA: Diagnosis not present

## 2017-05-21 DIAGNOSIS — Z992 Dependence on renal dialysis: Secondary | ICD-10-CM | POA: Diagnosis not present

## 2017-05-21 DIAGNOSIS — E611 Iron deficiency: Secondary | ICD-10-CM | POA: Diagnosis not present

## 2017-05-21 DIAGNOSIS — N186 End stage renal disease: Secondary | ICD-10-CM | POA: Diagnosis not present

## 2017-05-21 DIAGNOSIS — N2581 Secondary hyperparathyroidism of renal origin: Secondary | ICD-10-CM | POA: Diagnosis not present

## 2017-05-23 DIAGNOSIS — N186 End stage renal disease: Secondary | ICD-10-CM | POA: Diagnosis not present

## 2017-05-23 DIAGNOSIS — Z992 Dependence on renal dialysis: Secondary | ICD-10-CM | POA: Diagnosis not present

## 2017-05-24 DIAGNOSIS — Z992 Dependence on renal dialysis: Secondary | ICD-10-CM | POA: Diagnosis not present

## 2017-05-24 DIAGNOSIS — N186 End stage renal disease: Secondary | ICD-10-CM | POA: Diagnosis not present

## 2017-05-24 DIAGNOSIS — D509 Iron deficiency anemia, unspecified: Secondary | ICD-10-CM | POA: Diagnosis not present

## 2017-05-24 DIAGNOSIS — D631 Anemia in chronic kidney disease: Secondary | ICD-10-CM | POA: Diagnosis not present

## 2017-05-24 DIAGNOSIS — N2581 Secondary hyperparathyroidism of renal origin: Secondary | ICD-10-CM | POA: Diagnosis not present

## 2017-05-26 DIAGNOSIS — D509 Iron deficiency anemia, unspecified: Secondary | ICD-10-CM | POA: Diagnosis not present

## 2017-05-26 DIAGNOSIS — N186 End stage renal disease: Secondary | ICD-10-CM | POA: Diagnosis not present

## 2017-05-26 DIAGNOSIS — D631 Anemia in chronic kidney disease: Secondary | ICD-10-CM | POA: Diagnosis not present

## 2017-05-26 DIAGNOSIS — Z992 Dependence on renal dialysis: Secondary | ICD-10-CM | POA: Diagnosis not present

## 2017-05-26 DIAGNOSIS — N2581 Secondary hyperparathyroidism of renal origin: Secondary | ICD-10-CM | POA: Diagnosis not present

## 2017-05-28 DIAGNOSIS — N186 End stage renal disease: Secondary | ICD-10-CM | POA: Diagnosis not present

## 2017-05-28 DIAGNOSIS — D509 Iron deficiency anemia, unspecified: Secondary | ICD-10-CM | POA: Diagnosis not present

## 2017-05-28 DIAGNOSIS — D631 Anemia in chronic kidney disease: Secondary | ICD-10-CM | POA: Diagnosis not present

## 2017-05-28 DIAGNOSIS — Z992 Dependence on renal dialysis: Secondary | ICD-10-CM | POA: Diagnosis not present

## 2017-05-28 DIAGNOSIS — N2581 Secondary hyperparathyroidism of renal origin: Secondary | ICD-10-CM | POA: Diagnosis not present

## 2017-05-31 DIAGNOSIS — N2581 Secondary hyperparathyroidism of renal origin: Secondary | ICD-10-CM | POA: Diagnosis not present

## 2017-05-31 DIAGNOSIS — D509 Iron deficiency anemia, unspecified: Secondary | ICD-10-CM | POA: Diagnosis not present

## 2017-05-31 DIAGNOSIS — N186 End stage renal disease: Secondary | ICD-10-CM | POA: Diagnosis not present

## 2017-05-31 DIAGNOSIS — Z992 Dependence on renal dialysis: Secondary | ICD-10-CM | POA: Diagnosis not present

## 2017-05-31 DIAGNOSIS — D631 Anemia in chronic kidney disease: Secondary | ICD-10-CM | POA: Diagnosis not present

## 2017-06-02 DIAGNOSIS — N2581 Secondary hyperparathyroidism of renal origin: Secondary | ICD-10-CM | POA: Diagnosis not present

## 2017-06-02 DIAGNOSIS — Z992 Dependence on renal dialysis: Secondary | ICD-10-CM | POA: Diagnosis not present

## 2017-06-02 DIAGNOSIS — D631 Anemia in chronic kidney disease: Secondary | ICD-10-CM | POA: Diagnosis not present

## 2017-06-02 DIAGNOSIS — D509 Iron deficiency anemia, unspecified: Secondary | ICD-10-CM | POA: Diagnosis not present

## 2017-06-02 DIAGNOSIS — N186 End stage renal disease: Secondary | ICD-10-CM | POA: Diagnosis not present

## 2017-06-04 DIAGNOSIS — D631 Anemia in chronic kidney disease: Secondary | ICD-10-CM | POA: Diagnosis not present

## 2017-06-04 DIAGNOSIS — Z992 Dependence on renal dialysis: Secondary | ICD-10-CM | POA: Diagnosis not present

## 2017-06-04 DIAGNOSIS — N2581 Secondary hyperparathyroidism of renal origin: Secondary | ICD-10-CM | POA: Diagnosis not present

## 2017-06-04 DIAGNOSIS — D509 Iron deficiency anemia, unspecified: Secondary | ICD-10-CM | POA: Diagnosis not present

## 2017-06-04 DIAGNOSIS — N186 End stage renal disease: Secondary | ICD-10-CM | POA: Diagnosis not present

## 2017-06-07 DIAGNOSIS — N2581 Secondary hyperparathyroidism of renal origin: Secondary | ICD-10-CM | POA: Diagnosis not present

## 2017-06-07 DIAGNOSIS — D509 Iron deficiency anemia, unspecified: Secondary | ICD-10-CM | POA: Diagnosis not present

## 2017-06-07 DIAGNOSIS — Z992 Dependence on renal dialysis: Secondary | ICD-10-CM | POA: Diagnosis not present

## 2017-06-07 DIAGNOSIS — N186 End stage renal disease: Secondary | ICD-10-CM | POA: Diagnosis not present

## 2017-06-07 DIAGNOSIS — D631 Anemia in chronic kidney disease: Secondary | ICD-10-CM | POA: Diagnosis not present

## 2017-06-09 DIAGNOSIS — D631 Anemia in chronic kidney disease: Secondary | ICD-10-CM | POA: Diagnosis not present

## 2017-06-09 DIAGNOSIS — N2581 Secondary hyperparathyroidism of renal origin: Secondary | ICD-10-CM | POA: Diagnosis not present

## 2017-06-09 DIAGNOSIS — Z992 Dependence on renal dialysis: Secondary | ICD-10-CM | POA: Diagnosis not present

## 2017-06-09 DIAGNOSIS — D509 Iron deficiency anemia, unspecified: Secondary | ICD-10-CM | POA: Diagnosis not present

## 2017-06-09 DIAGNOSIS — N186 End stage renal disease: Secondary | ICD-10-CM | POA: Diagnosis not present

## 2017-06-11 DIAGNOSIS — N2581 Secondary hyperparathyroidism of renal origin: Secondary | ICD-10-CM | POA: Diagnosis not present

## 2017-06-11 DIAGNOSIS — Z992 Dependence on renal dialysis: Secondary | ICD-10-CM | POA: Diagnosis not present

## 2017-06-11 DIAGNOSIS — D631 Anemia in chronic kidney disease: Secondary | ICD-10-CM | POA: Diagnosis not present

## 2017-06-11 DIAGNOSIS — N186 End stage renal disease: Secondary | ICD-10-CM | POA: Diagnosis not present

## 2017-06-11 DIAGNOSIS — D509 Iron deficiency anemia, unspecified: Secondary | ICD-10-CM | POA: Diagnosis not present

## 2017-06-14 DIAGNOSIS — D631 Anemia in chronic kidney disease: Secondary | ICD-10-CM | POA: Diagnosis not present

## 2017-06-14 DIAGNOSIS — D509 Iron deficiency anemia, unspecified: Secondary | ICD-10-CM | POA: Diagnosis not present

## 2017-06-14 DIAGNOSIS — Z992 Dependence on renal dialysis: Secondary | ICD-10-CM | POA: Diagnosis not present

## 2017-06-14 DIAGNOSIS — N186 End stage renal disease: Secondary | ICD-10-CM | POA: Diagnosis not present

## 2017-06-14 DIAGNOSIS — N2581 Secondary hyperparathyroidism of renal origin: Secondary | ICD-10-CM | POA: Diagnosis not present

## 2017-06-15 DIAGNOSIS — D631 Anemia in chronic kidney disease: Secondary | ICD-10-CM | POA: Diagnosis not present

## 2017-06-15 DIAGNOSIS — N2581 Secondary hyperparathyroidism of renal origin: Secondary | ICD-10-CM | POA: Diagnosis not present

## 2017-06-15 DIAGNOSIS — D509 Iron deficiency anemia, unspecified: Secondary | ICD-10-CM | POA: Diagnosis not present

## 2017-06-15 DIAGNOSIS — N186 End stage renal disease: Secondary | ICD-10-CM | POA: Diagnosis not present

## 2017-06-15 DIAGNOSIS — Z992 Dependence on renal dialysis: Secondary | ICD-10-CM | POA: Diagnosis not present

## 2017-06-16 DIAGNOSIS — N186 End stage renal disease: Secondary | ICD-10-CM | POA: Diagnosis not present

## 2017-06-16 DIAGNOSIS — D631 Anemia in chronic kidney disease: Secondary | ICD-10-CM | POA: Diagnosis not present

## 2017-06-16 DIAGNOSIS — N2581 Secondary hyperparathyroidism of renal origin: Secondary | ICD-10-CM | POA: Diagnosis not present

## 2017-06-16 DIAGNOSIS — D509 Iron deficiency anemia, unspecified: Secondary | ICD-10-CM | POA: Diagnosis not present

## 2017-06-16 DIAGNOSIS — Z992 Dependence on renal dialysis: Secondary | ICD-10-CM | POA: Diagnosis not present

## 2017-06-18 DIAGNOSIS — D631 Anemia in chronic kidney disease: Secondary | ICD-10-CM | POA: Diagnosis not present

## 2017-06-18 DIAGNOSIS — Z992 Dependence on renal dialysis: Secondary | ICD-10-CM | POA: Diagnosis not present

## 2017-06-18 DIAGNOSIS — D509 Iron deficiency anemia, unspecified: Secondary | ICD-10-CM | POA: Diagnosis not present

## 2017-06-18 DIAGNOSIS — N2581 Secondary hyperparathyroidism of renal origin: Secondary | ICD-10-CM | POA: Diagnosis not present

## 2017-06-18 DIAGNOSIS — N186 End stage renal disease: Secondary | ICD-10-CM | POA: Diagnosis not present

## 2017-06-21 DIAGNOSIS — D631 Anemia in chronic kidney disease: Secondary | ICD-10-CM | POA: Diagnosis not present

## 2017-06-21 DIAGNOSIS — Z992 Dependence on renal dialysis: Secondary | ICD-10-CM | POA: Diagnosis not present

## 2017-06-21 DIAGNOSIS — N2581 Secondary hyperparathyroidism of renal origin: Secondary | ICD-10-CM | POA: Diagnosis not present

## 2017-06-21 DIAGNOSIS — N186 End stage renal disease: Secondary | ICD-10-CM | POA: Diagnosis not present

## 2017-06-21 DIAGNOSIS — D509 Iron deficiency anemia, unspecified: Secondary | ICD-10-CM | POA: Diagnosis not present

## 2017-06-23 DIAGNOSIS — D509 Iron deficiency anemia, unspecified: Secondary | ICD-10-CM | POA: Diagnosis not present

## 2017-06-23 DIAGNOSIS — N2581 Secondary hyperparathyroidism of renal origin: Secondary | ICD-10-CM | POA: Diagnosis not present

## 2017-06-23 DIAGNOSIS — D631 Anemia in chronic kidney disease: Secondary | ICD-10-CM | POA: Diagnosis not present

## 2017-06-23 DIAGNOSIS — Z992 Dependence on renal dialysis: Secondary | ICD-10-CM | POA: Diagnosis not present

## 2017-06-23 DIAGNOSIS — N186 End stage renal disease: Secondary | ICD-10-CM | POA: Diagnosis not present

## 2017-06-24 DIAGNOSIS — L299 Pruritus, unspecified: Secondary | ICD-10-CM | POA: Diagnosis not present

## 2017-06-24 DIAGNOSIS — N186 End stage renal disease: Secondary | ICD-10-CM | POA: Diagnosis not present

## 2017-06-24 DIAGNOSIS — Z992 Dependence on renal dialysis: Secondary | ICD-10-CM | POA: Diagnosis not present

## 2017-06-24 DIAGNOSIS — E611 Iron deficiency: Secondary | ICD-10-CM | POA: Diagnosis not present

## 2017-06-24 DIAGNOSIS — N2581 Secondary hyperparathyroidism of renal origin: Secondary | ICD-10-CM | POA: Diagnosis not present

## 2017-06-24 DIAGNOSIS — D631 Anemia in chronic kidney disease: Secondary | ICD-10-CM | POA: Diagnosis not present

## 2017-06-25 DIAGNOSIS — N2581 Secondary hyperparathyroidism of renal origin: Secondary | ICD-10-CM | POA: Diagnosis not present

## 2017-06-25 DIAGNOSIS — Z992 Dependence on renal dialysis: Secondary | ICD-10-CM | POA: Diagnosis not present

## 2017-06-25 DIAGNOSIS — N186 End stage renal disease: Secondary | ICD-10-CM | POA: Diagnosis not present

## 2017-06-25 DIAGNOSIS — L299 Pruritus, unspecified: Secondary | ICD-10-CM | POA: Diagnosis not present

## 2017-06-25 DIAGNOSIS — E611 Iron deficiency: Secondary | ICD-10-CM | POA: Diagnosis not present

## 2017-06-25 DIAGNOSIS — D631 Anemia in chronic kidney disease: Secondary | ICD-10-CM | POA: Diagnosis not present

## 2017-06-28 DIAGNOSIS — D631 Anemia in chronic kidney disease: Secondary | ICD-10-CM | POA: Diagnosis not present

## 2017-06-28 DIAGNOSIS — N2581 Secondary hyperparathyroidism of renal origin: Secondary | ICD-10-CM | POA: Diagnosis not present

## 2017-06-28 DIAGNOSIS — L299 Pruritus, unspecified: Secondary | ICD-10-CM | POA: Diagnosis not present

## 2017-06-28 DIAGNOSIS — E611 Iron deficiency: Secondary | ICD-10-CM | POA: Diagnosis not present

## 2017-06-28 DIAGNOSIS — N186 End stage renal disease: Secondary | ICD-10-CM | POA: Diagnosis not present

## 2017-06-28 DIAGNOSIS — Z992 Dependence on renal dialysis: Secondary | ICD-10-CM | POA: Diagnosis not present

## 2017-06-30 DIAGNOSIS — E611 Iron deficiency: Secondary | ICD-10-CM | POA: Diagnosis not present

## 2017-06-30 DIAGNOSIS — N2581 Secondary hyperparathyroidism of renal origin: Secondary | ICD-10-CM | POA: Diagnosis not present

## 2017-06-30 DIAGNOSIS — L299 Pruritus, unspecified: Secondary | ICD-10-CM | POA: Diagnosis not present

## 2017-06-30 DIAGNOSIS — Z992 Dependence on renal dialysis: Secondary | ICD-10-CM | POA: Diagnosis not present

## 2017-06-30 DIAGNOSIS — N186 End stage renal disease: Secondary | ICD-10-CM | POA: Diagnosis not present

## 2017-06-30 DIAGNOSIS — D631 Anemia in chronic kidney disease: Secondary | ICD-10-CM | POA: Diagnosis not present

## 2017-07-02 DIAGNOSIS — E611 Iron deficiency: Secondary | ICD-10-CM | POA: Diagnosis not present

## 2017-07-02 DIAGNOSIS — D631 Anemia in chronic kidney disease: Secondary | ICD-10-CM | POA: Diagnosis not present

## 2017-07-02 DIAGNOSIS — L299 Pruritus, unspecified: Secondary | ICD-10-CM | POA: Diagnosis not present

## 2017-07-02 DIAGNOSIS — N186 End stage renal disease: Secondary | ICD-10-CM | POA: Diagnosis not present

## 2017-07-02 DIAGNOSIS — Z992 Dependence on renal dialysis: Secondary | ICD-10-CM | POA: Diagnosis not present

## 2017-07-02 DIAGNOSIS — N2581 Secondary hyperparathyroidism of renal origin: Secondary | ICD-10-CM | POA: Diagnosis not present

## 2017-07-05 DIAGNOSIS — L299 Pruritus, unspecified: Secondary | ICD-10-CM | POA: Diagnosis not present

## 2017-07-05 DIAGNOSIS — Z992 Dependence on renal dialysis: Secondary | ICD-10-CM | POA: Diagnosis not present

## 2017-07-05 DIAGNOSIS — D631 Anemia in chronic kidney disease: Secondary | ICD-10-CM | POA: Diagnosis not present

## 2017-07-05 DIAGNOSIS — E611 Iron deficiency: Secondary | ICD-10-CM | POA: Diagnosis not present

## 2017-07-05 DIAGNOSIS — N186 End stage renal disease: Secondary | ICD-10-CM | POA: Diagnosis not present

## 2017-07-05 DIAGNOSIS — N2581 Secondary hyperparathyroidism of renal origin: Secondary | ICD-10-CM | POA: Diagnosis not present

## 2017-07-07 DIAGNOSIS — N2581 Secondary hyperparathyroidism of renal origin: Secondary | ICD-10-CM | POA: Diagnosis not present

## 2017-07-07 DIAGNOSIS — N186 End stage renal disease: Secondary | ICD-10-CM | POA: Diagnosis not present

## 2017-07-07 DIAGNOSIS — D631 Anemia in chronic kidney disease: Secondary | ICD-10-CM | POA: Diagnosis not present

## 2017-07-07 DIAGNOSIS — Z992 Dependence on renal dialysis: Secondary | ICD-10-CM | POA: Diagnosis not present

## 2017-07-07 DIAGNOSIS — E611 Iron deficiency: Secondary | ICD-10-CM | POA: Diagnosis not present

## 2017-07-07 DIAGNOSIS — L299 Pruritus, unspecified: Secondary | ICD-10-CM | POA: Diagnosis not present

## 2017-07-09 DIAGNOSIS — E611 Iron deficiency: Secondary | ICD-10-CM | POA: Diagnosis not present

## 2017-07-09 DIAGNOSIS — Z992 Dependence on renal dialysis: Secondary | ICD-10-CM | POA: Diagnosis not present

## 2017-07-09 DIAGNOSIS — N2581 Secondary hyperparathyroidism of renal origin: Secondary | ICD-10-CM | POA: Diagnosis not present

## 2017-07-09 DIAGNOSIS — N186 End stage renal disease: Secondary | ICD-10-CM | POA: Diagnosis not present

## 2017-07-09 DIAGNOSIS — D631 Anemia in chronic kidney disease: Secondary | ICD-10-CM | POA: Diagnosis not present

## 2017-07-09 DIAGNOSIS — L299 Pruritus, unspecified: Secondary | ICD-10-CM | POA: Diagnosis not present

## 2017-07-12 DIAGNOSIS — L299 Pruritus, unspecified: Secondary | ICD-10-CM | POA: Diagnosis not present

## 2017-07-12 DIAGNOSIS — N2581 Secondary hyperparathyroidism of renal origin: Secondary | ICD-10-CM | POA: Diagnosis not present

## 2017-07-12 DIAGNOSIS — N186 End stage renal disease: Secondary | ICD-10-CM | POA: Diagnosis not present

## 2017-07-12 DIAGNOSIS — E611 Iron deficiency: Secondary | ICD-10-CM | POA: Diagnosis not present

## 2017-07-12 DIAGNOSIS — D631 Anemia in chronic kidney disease: Secondary | ICD-10-CM | POA: Diagnosis not present

## 2017-07-12 DIAGNOSIS — Z992 Dependence on renal dialysis: Secondary | ICD-10-CM | POA: Diagnosis not present

## 2017-07-14 DIAGNOSIS — N2581 Secondary hyperparathyroidism of renal origin: Secondary | ICD-10-CM | POA: Diagnosis not present

## 2017-07-14 DIAGNOSIS — L299 Pruritus, unspecified: Secondary | ICD-10-CM | POA: Diagnosis not present

## 2017-07-14 DIAGNOSIS — D631 Anemia in chronic kidney disease: Secondary | ICD-10-CM | POA: Diagnosis not present

## 2017-07-14 DIAGNOSIS — E611 Iron deficiency: Secondary | ICD-10-CM | POA: Diagnosis not present

## 2017-07-14 DIAGNOSIS — N186 End stage renal disease: Secondary | ICD-10-CM | POA: Diagnosis not present

## 2017-07-14 DIAGNOSIS — Z992 Dependence on renal dialysis: Secondary | ICD-10-CM | POA: Diagnosis not present

## 2017-07-15 DIAGNOSIS — D631 Anemia in chronic kidney disease: Secondary | ICD-10-CM | POA: Diagnosis not present

## 2017-07-15 DIAGNOSIS — N2581 Secondary hyperparathyroidism of renal origin: Secondary | ICD-10-CM | POA: Diagnosis not present

## 2017-07-15 DIAGNOSIS — E611 Iron deficiency: Secondary | ICD-10-CM | POA: Diagnosis not present

## 2017-07-15 DIAGNOSIS — L299 Pruritus, unspecified: Secondary | ICD-10-CM | POA: Diagnosis not present

## 2017-07-15 DIAGNOSIS — Z992 Dependence on renal dialysis: Secondary | ICD-10-CM | POA: Diagnosis not present

## 2017-07-15 DIAGNOSIS — N186 End stage renal disease: Secondary | ICD-10-CM | POA: Diagnosis not present

## 2017-07-16 DIAGNOSIS — N186 End stage renal disease: Secondary | ICD-10-CM | POA: Diagnosis not present

## 2017-07-16 DIAGNOSIS — E611 Iron deficiency: Secondary | ICD-10-CM | POA: Diagnosis not present

## 2017-07-16 DIAGNOSIS — N2581 Secondary hyperparathyroidism of renal origin: Secondary | ICD-10-CM | POA: Diagnosis not present

## 2017-07-16 DIAGNOSIS — L299 Pruritus, unspecified: Secondary | ICD-10-CM | POA: Diagnosis not present

## 2017-07-16 DIAGNOSIS — Z992 Dependence on renal dialysis: Secondary | ICD-10-CM | POA: Diagnosis not present

## 2017-07-16 DIAGNOSIS — D631 Anemia in chronic kidney disease: Secondary | ICD-10-CM | POA: Diagnosis not present

## 2017-07-19 DIAGNOSIS — Z992 Dependence on renal dialysis: Secondary | ICD-10-CM | POA: Diagnosis not present

## 2017-07-19 DIAGNOSIS — E611 Iron deficiency: Secondary | ICD-10-CM | POA: Diagnosis not present

## 2017-07-19 DIAGNOSIS — L299 Pruritus, unspecified: Secondary | ICD-10-CM | POA: Diagnosis not present

## 2017-07-19 DIAGNOSIS — N186 End stage renal disease: Secondary | ICD-10-CM | POA: Diagnosis not present

## 2017-07-19 DIAGNOSIS — N2581 Secondary hyperparathyroidism of renal origin: Secondary | ICD-10-CM | POA: Diagnosis not present

## 2017-07-19 DIAGNOSIS — D631 Anemia in chronic kidney disease: Secondary | ICD-10-CM | POA: Diagnosis not present

## 2017-07-21 DIAGNOSIS — L299 Pruritus, unspecified: Secondary | ICD-10-CM | POA: Diagnosis not present

## 2017-07-21 DIAGNOSIS — Z992 Dependence on renal dialysis: Secondary | ICD-10-CM | POA: Diagnosis not present

## 2017-07-21 DIAGNOSIS — E611 Iron deficiency: Secondary | ICD-10-CM | POA: Diagnosis not present

## 2017-07-21 DIAGNOSIS — D631 Anemia in chronic kidney disease: Secondary | ICD-10-CM | POA: Diagnosis not present

## 2017-07-21 DIAGNOSIS — N2581 Secondary hyperparathyroidism of renal origin: Secondary | ICD-10-CM | POA: Diagnosis not present

## 2017-07-21 DIAGNOSIS — N186 End stage renal disease: Secondary | ICD-10-CM | POA: Diagnosis not present

## 2017-07-22 DIAGNOSIS — N186 End stage renal disease: Secondary | ICD-10-CM | POA: Diagnosis not present

## 2017-07-22 DIAGNOSIS — Z992 Dependence on renal dialysis: Secondary | ICD-10-CM | POA: Diagnosis not present

## 2017-07-22 DIAGNOSIS — E611 Iron deficiency: Secondary | ICD-10-CM | POA: Diagnosis not present

## 2017-07-22 DIAGNOSIS — D631 Anemia in chronic kidney disease: Secondary | ICD-10-CM | POA: Diagnosis not present

## 2017-07-22 DIAGNOSIS — N2581 Secondary hyperparathyroidism of renal origin: Secondary | ICD-10-CM | POA: Diagnosis not present

## 2017-07-22 DIAGNOSIS — L299 Pruritus, unspecified: Secondary | ICD-10-CM | POA: Diagnosis not present

## 2017-07-23 DIAGNOSIS — N2581 Secondary hyperparathyroidism of renal origin: Secondary | ICD-10-CM | POA: Diagnosis not present

## 2017-07-23 DIAGNOSIS — E611 Iron deficiency: Secondary | ICD-10-CM | POA: Diagnosis not present

## 2017-07-23 DIAGNOSIS — D631 Anemia in chronic kidney disease: Secondary | ICD-10-CM | POA: Diagnosis not present

## 2017-07-23 DIAGNOSIS — L299 Pruritus, unspecified: Secondary | ICD-10-CM | POA: Diagnosis not present

## 2017-07-23 DIAGNOSIS — N186 End stage renal disease: Secondary | ICD-10-CM | POA: Diagnosis not present

## 2017-07-23 DIAGNOSIS — Z992 Dependence on renal dialysis: Secondary | ICD-10-CM | POA: Diagnosis not present

## 2017-07-26 DIAGNOSIS — N2581 Secondary hyperparathyroidism of renal origin: Secondary | ICD-10-CM | POA: Diagnosis not present

## 2017-07-26 DIAGNOSIS — N186 End stage renal disease: Secondary | ICD-10-CM | POA: Diagnosis not present

## 2017-07-26 DIAGNOSIS — L299 Pruritus, unspecified: Secondary | ICD-10-CM | POA: Diagnosis not present

## 2017-07-26 DIAGNOSIS — E611 Iron deficiency: Secondary | ICD-10-CM | POA: Diagnosis not present

## 2017-07-26 DIAGNOSIS — Z992 Dependence on renal dialysis: Secondary | ICD-10-CM | POA: Diagnosis not present

## 2017-07-26 DIAGNOSIS — D631 Anemia in chronic kidney disease: Secondary | ICD-10-CM | POA: Diagnosis not present

## 2017-07-28 DIAGNOSIS — E611 Iron deficiency: Secondary | ICD-10-CM | POA: Diagnosis not present

## 2017-07-28 DIAGNOSIS — N2581 Secondary hyperparathyroidism of renal origin: Secondary | ICD-10-CM | POA: Diagnosis not present

## 2017-07-28 DIAGNOSIS — L299 Pruritus, unspecified: Secondary | ICD-10-CM | POA: Diagnosis not present

## 2017-07-28 DIAGNOSIS — D631 Anemia in chronic kidney disease: Secondary | ICD-10-CM | POA: Diagnosis not present

## 2017-07-28 DIAGNOSIS — N186 End stage renal disease: Secondary | ICD-10-CM | POA: Diagnosis not present

## 2017-07-28 DIAGNOSIS — Z992 Dependence on renal dialysis: Secondary | ICD-10-CM | POA: Diagnosis not present

## 2017-07-30 DIAGNOSIS — E611 Iron deficiency: Secondary | ICD-10-CM | POA: Diagnosis not present

## 2017-07-30 DIAGNOSIS — N186 End stage renal disease: Secondary | ICD-10-CM | POA: Diagnosis not present

## 2017-07-30 DIAGNOSIS — L299 Pruritus, unspecified: Secondary | ICD-10-CM | POA: Diagnosis not present

## 2017-07-30 DIAGNOSIS — D631 Anemia in chronic kidney disease: Secondary | ICD-10-CM | POA: Diagnosis not present

## 2017-07-30 DIAGNOSIS — N2581 Secondary hyperparathyroidism of renal origin: Secondary | ICD-10-CM | POA: Diagnosis not present

## 2017-07-30 DIAGNOSIS — Z992 Dependence on renal dialysis: Secondary | ICD-10-CM | POA: Diagnosis not present

## 2017-08-02 DIAGNOSIS — L299 Pruritus, unspecified: Secondary | ICD-10-CM | POA: Diagnosis not present

## 2017-08-02 DIAGNOSIS — E611 Iron deficiency: Secondary | ICD-10-CM | POA: Diagnosis not present

## 2017-08-02 DIAGNOSIS — N2581 Secondary hyperparathyroidism of renal origin: Secondary | ICD-10-CM | POA: Diagnosis not present

## 2017-08-02 DIAGNOSIS — Z992 Dependence on renal dialysis: Secondary | ICD-10-CM | POA: Diagnosis not present

## 2017-08-02 DIAGNOSIS — N186 End stage renal disease: Secondary | ICD-10-CM | POA: Diagnosis not present

## 2017-08-02 DIAGNOSIS — D631 Anemia in chronic kidney disease: Secondary | ICD-10-CM | POA: Diagnosis not present

## 2017-08-04 DIAGNOSIS — Z992 Dependence on renal dialysis: Secondary | ICD-10-CM | POA: Diagnosis not present

## 2017-08-04 DIAGNOSIS — N2581 Secondary hyperparathyroidism of renal origin: Secondary | ICD-10-CM | POA: Diagnosis not present

## 2017-08-04 DIAGNOSIS — N186 End stage renal disease: Secondary | ICD-10-CM | POA: Diagnosis not present

## 2017-08-04 DIAGNOSIS — E611 Iron deficiency: Secondary | ICD-10-CM | POA: Diagnosis not present

## 2017-08-04 DIAGNOSIS — L299 Pruritus, unspecified: Secondary | ICD-10-CM | POA: Diagnosis not present

## 2017-08-04 DIAGNOSIS — D631 Anemia in chronic kidney disease: Secondary | ICD-10-CM | POA: Diagnosis not present

## 2017-08-06 DIAGNOSIS — L299 Pruritus, unspecified: Secondary | ICD-10-CM | POA: Diagnosis not present

## 2017-08-06 DIAGNOSIS — D631 Anemia in chronic kidney disease: Secondary | ICD-10-CM | POA: Diagnosis not present

## 2017-08-06 DIAGNOSIS — E611 Iron deficiency: Secondary | ICD-10-CM | POA: Diagnosis not present

## 2017-08-06 DIAGNOSIS — Z992 Dependence on renal dialysis: Secondary | ICD-10-CM | POA: Diagnosis not present

## 2017-08-06 DIAGNOSIS — N2581 Secondary hyperparathyroidism of renal origin: Secondary | ICD-10-CM | POA: Diagnosis not present

## 2017-08-06 DIAGNOSIS — N186 End stage renal disease: Secondary | ICD-10-CM | POA: Diagnosis not present

## 2017-08-09 DIAGNOSIS — N186 End stage renal disease: Secondary | ICD-10-CM | POA: Diagnosis not present

## 2017-08-09 DIAGNOSIS — L299 Pruritus, unspecified: Secondary | ICD-10-CM | POA: Diagnosis not present

## 2017-08-09 DIAGNOSIS — E611 Iron deficiency: Secondary | ICD-10-CM | POA: Diagnosis not present

## 2017-08-09 DIAGNOSIS — N2581 Secondary hyperparathyroidism of renal origin: Secondary | ICD-10-CM | POA: Diagnosis not present

## 2017-08-09 DIAGNOSIS — D631 Anemia in chronic kidney disease: Secondary | ICD-10-CM | POA: Diagnosis not present

## 2017-08-09 DIAGNOSIS — Z992 Dependence on renal dialysis: Secondary | ICD-10-CM | POA: Diagnosis not present

## 2017-08-11 DIAGNOSIS — N186 End stage renal disease: Secondary | ICD-10-CM | POA: Diagnosis not present

## 2017-08-11 DIAGNOSIS — Z992 Dependence on renal dialysis: Secondary | ICD-10-CM | POA: Diagnosis not present

## 2017-08-11 DIAGNOSIS — N2581 Secondary hyperparathyroidism of renal origin: Secondary | ICD-10-CM | POA: Diagnosis not present

## 2017-08-11 DIAGNOSIS — D631 Anemia in chronic kidney disease: Secondary | ICD-10-CM | POA: Diagnosis not present

## 2017-08-11 DIAGNOSIS — L299 Pruritus, unspecified: Secondary | ICD-10-CM | POA: Diagnosis not present

## 2017-08-11 DIAGNOSIS — E611 Iron deficiency: Secondary | ICD-10-CM | POA: Diagnosis not present

## 2017-08-13 DIAGNOSIS — E611 Iron deficiency: Secondary | ICD-10-CM | POA: Diagnosis not present

## 2017-08-13 DIAGNOSIS — D631 Anemia in chronic kidney disease: Secondary | ICD-10-CM | POA: Diagnosis not present

## 2017-08-13 DIAGNOSIS — N186 End stage renal disease: Secondary | ICD-10-CM | POA: Diagnosis not present

## 2017-08-13 DIAGNOSIS — L299 Pruritus, unspecified: Secondary | ICD-10-CM | POA: Diagnosis not present

## 2017-08-13 DIAGNOSIS — Z992 Dependence on renal dialysis: Secondary | ICD-10-CM | POA: Diagnosis not present

## 2017-08-13 DIAGNOSIS — N2581 Secondary hyperparathyroidism of renal origin: Secondary | ICD-10-CM | POA: Diagnosis not present

## 2017-08-14 DIAGNOSIS — L299 Pruritus, unspecified: Secondary | ICD-10-CM | POA: Diagnosis not present

## 2017-08-14 DIAGNOSIS — N186 End stage renal disease: Secondary | ICD-10-CM | POA: Diagnosis not present

## 2017-08-14 DIAGNOSIS — D631 Anemia in chronic kidney disease: Secondary | ICD-10-CM | POA: Diagnosis not present

## 2017-08-14 DIAGNOSIS — N2581 Secondary hyperparathyroidism of renal origin: Secondary | ICD-10-CM | POA: Diagnosis not present

## 2017-08-14 DIAGNOSIS — E611 Iron deficiency: Secondary | ICD-10-CM | POA: Diagnosis not present

## 2017-08-14 DIAGNOSIS — Z992 Dependence on renal dialysis: Secondary | ICD-10-CM | POA: Diagnosis not present

## 2017-08-16 DIAGNOSIS — D631 Anemia in chronic kidney disease: Secondary | ICD-10-CM | POA: Diagnosis not present

## 2017-08-16 DIAGNOSIS — N186 End stage renal disease: Secondary | ICD-10-CM | POA: Diagnosis not present

## 2017-08-16 DIAGNOSIS — N2581 Secondary hyperparathyroidism of renal origin: Secondary | ICD-10-CM | POA: Diagnosis not present

## 2017-08-16 DIAGNOSIS — E611 Iron deficiency: Secondary | ICD-10-CM | POA: Diagnosis not present

## 2017-08-16 DIAGNOSIS — Z992 Dependence on renal dialysis: Secondary | ICD-10-CM | POA: Diagnosis not present

## 2017-08-16 DIAGNOSIS — L299 Pruritus, unspecified: Secondary | ICD-10-CM | POA: Diagnosis not present

## 2017-08-18 DIAGNOSIS — E611 Iron deficiency: Secondary | ICD-10-CM | POA: Diagnosis not present

## 2017-08-18 DIAGNOSIS — Z992 Dependence on renal dialysis: Secondary | ICD-10-CM | POA: Diagnosis not present

## 2017-08-18 DIAGNOSIS — N2581 Secondary hyperparathyroidism of renal origin: Secondary | ICD-10-CM | POA: Diagnosis not present

## 2017-08-18 DIAGNOSIS — N186 End stage renal disease: Secondary | ICD-10-CM | POA: Diagnosis not present

## 2017-08-18 DIAGNOSIS — D631 Anemia in chronic kidney disease: Secondary | ICD-10-CM | POA: Diagnosis not present

## 2017-08-18 DIAGNOSIS — L299 Pruritus, unspecified: Secondary | ICD-10-CM | POA: Diagnosis not present

## 2017-08-20 DIAGNOSIS — L299 Pruritus, unspecified: Secondary | ICD-10-CM | POA: Diagnosis not present

## 2017-08-20 DIAGNOSIS — E611 Iron deficiency: Secondary | ICD-10-CM | POA: Diagnosis not present

## 2017-08-20 DIAGNOSIS — N186 End stage renal disease: Secondary | ICD-10-CM | POA: Diagnosis not present

## 2017-08-20 DIAGNOSIS — Z992 Dependence on renal dialysis: Secondary | ICD-10-CM | POA: Diagnosis not present

## 2017-08-20 DIAGNOSIS — D631 Anemia in chronic kidney disease: Secondary | ICD-10-CM | POA: Diagnosis not present

## 2017-08-20 DIAGNOSIS — N2581 Secondary hyperparathyroidism of renal origin: Secondary | ICD-10-CM | POA: Diagnosis not present

## 2017-08-21 DIAGNOSIS — Z992 Dependence on renal dialysis: Secondary | ICD-10-CM | POA: Diagnosis not present

## 2017-08-21 DIAGNOSIS — N186 End stage renal disease: Secondary | ICD-10-CM | POA: Diagnosis not present

## 2017-08-22 DIAGNOSIS — N186 End stage renal disease: Secondary | ICD-10-CM | POA: Diagnosis not present

## 2017-08-22 DIAGNOSIS — Z992 Dependence on renal dialysis: Secondary | ICD-10-CM | POA: Diagnosis not present

## 2017-08-22 DIAGNOSIS — N2581 Secondary hyperparathyroidism of renal origin: Secondary | ICD-10-CM | POA: Diagnosis not present

## 2017-08-22 DIAGNOSIS — D509 Iron deficiency anemia, unspecified: Secondary | ICD-10-CM | POA: Diagnosis not present

## 2017-08-23 DIAGNOSIS — N186 End stage renal disease: Secondary | ICD-10-CM | POA: Diagnosis not present

## 2017-08-23 DIAGNOSIS — N2581 Secondary hyperparathyroidism of renal origin: Secondary | ICD-10-CM | POA: Diagnosis not present

## 2017-08-23 DIAGNOSIS — Z992 Dependence on renal dialysis: Secondary | ICD-10-CM | POA: Diagnosis not present

## 2017-08-23 DIAGNOSIS — D509 Iron deficiency anemia, unspecified: Secondary | ICD-10-CM | POA: Diagnosis not present

## 2017-08-25 DIAGNOSIS — D509 Iron deficiency anemia, unspecified: Secondary | ICD-10-CM | POA: Diagnosis not present

## 2017-08-25 DIAGNOSIS — N2581 Secondary hyperparathyroidism of renal origin: Secondary | ICD-10-CM | POA: Diagnosis not present

## 2017-08-25 DIAGNOSIS — N186 End stage renal disease: Secondary | ICD-10-CM | POA: Diagnosis not present

## 2017-08-25 DIAGNOSIS — Z992 Dependence on renal dialysis: Secondary | ICD-10-CM | POA: Diagnosis not present

## 2017-08-27 DIAGNOSIS — Z992 Dependence on renal dialysis: Secondary | ICD-10-CM | POA: Diagnosis not present

## 2017-08-27 DIAGNOSIS — N2581 Secondary hyperparathyroidism of renal origin: Secondary | ICD-10-CM | POA: Diagnosis not present

## 2017-08-27 DIAGNOSIS — N186 End stage renal disease: Secondary | ICD-10-CM | POA: Diagnosis not present

## 2017-08-27 DIAGNOSIS — D509 Iron deficiency anemia, unspecified: Secondary | ICD-10-CM | POA: Diagnosis not present

## 2017-08-30 DIAGNOSIS — D509 Iron deficiency anemia, unspecified: Secondary | ICD-10-CM | POA: Diagnosis not present

## 2017-08-30 DIAGNOSIS — N186 End stage renal disease: Secondary | ICD-10-CM | POA: Diagnosis not present

## 2017-08-30 DIAGNOSIS — Z992 Dependence on renal dialysis: Secondary | ICD-10-CM | POA: Diagnosis not present

## 2017-08-30 DIAGNOSIS — N2581 Secondary hyperparathyroidism of renal origin: Secondary | ICD-10-CM | POA: Diagnosis not present

## 2017-09-01 DIAGNOSIS — D509 Iron deficiency anemia, unspecified: Secondary | ICD-10-CM | POA: Diagnosis not present

## 2017-09-01 DIAGNOSIS — N186 End stage renal disease: Secondary | ICD-10-CM | POA: Diagnosis not present

## 2017-09-01 DIAGNOSIS — N2581 Secondary hyperparathyroidism of renal origin: Secondary | ICD-10-CM | POA: Diagnosis not present

## 2017-09-01 DIAGNOSIS — Z992 Dependence on renal dialysis: Secondary | ICD-10-CM | POA: Diagnosis not present

## 2017-09-02 DIAGNOSIS — J449 Chronic obstructive pulmonary disease, unspecified: Secondary | ICD-10-CM | POA: Diagnosis not present

## 2017-09-02 DIAGNOSIS — M545 Low back pain: Secondary | ICD-10-CM | POA: Diagnosis not present

## 2017-09-02 DIAGNOSIS — F17200 Nicotine dependence, unspecified, uncomplicated: Secondary | ICD-10-CM | POA: Diagnosis not present

## 2017-09-02 DIAGNOSIS — I120 Hypertensive chronic kidney disease with stage 5 chronic kidney disease or end stage renal disease: Secondary | ICD-10-CM | POA: Diagnosis not present

## 2017-09-03 DIAGNOSIS — Z992 Dependence on renal dialysis: Secondary | ICD-10-CM | POA: Diagnosis not present

## 2017-09-03 DIAGNOSIS — N2581 Secondary hyperparathyroidism of renal origin: Secondary | ICD-10-CM | POA: Diagnosis not present

## 2017-09-03 DIAGNOSIS — D509 Iron deficiency anemia, unspecified: Secondary | ICD-10-CM | POA: Diagnosis not present

## 2017-09-03 DIAGNOSIS — N186 End stage renal disease: Secondary | ICD-10-CM | POA: Diagnosis not present

## 2017-09-06 DIAGNOSIS — N2581 Secondary hyperparathyroidism of renal origin: Secondary | ICD-10-CM | POA: Diagnosis not present

## 2017-09-06 DIAGNOSIS — N186 End stage renal disease: Secondary | ICD-10-CM | POA: Diagnosis not present

## 2017-09-06 DIAGNOSIS — D509 Iron deficiency anemia, unspecified: Secondary | ICD-10-CM | POA: Diagnosis not present

## 2017-09-06 DIAGNOSIS — Z992 Dependence on renal dialysis: Secondary | ICD-10-CM | POA: Diagnosis not present

## 2017-09-08 DIAGNOSIS — N2581 Secondary hyperparathyroidism of renal origin: Secondary | ICD-10-CM | POA: Diagnosis not present

## 2017-09-08 DIAGNOSIS — D509 Iron deficiency anemia, unspecified: Secondary | ICD-10-CM | POA: Diagnosis not present

## 2017-09-08 DIAGNOSIS — N186 End stage renal disease: Secondary | ICD-10-CM | POA: Diagnosis not present

## 2017-09-08 DIAGNOSIS — Z992 Dependence on renal dialysis: Secondary | ICD-10-CM | POA: Diagnosis not present

## 2017-09-10 DIAGNOSIS — D509 Iron deficiency anemia, unspecified: Secondary | ICD-10-CM | POA: Diagnosis not present

## 2017-09-10 DIAGNOSIS — N186 End stage renal disease: Secondary | ICD-10-CM | POA: Diagnosis not present

## 2017-09-10 DIAGNOSIS — N2581 Secondary hyperparathyroidism of renal origin: Secondary | ICD-10-CM | POA: Diagnosis not present

## 2017-09-10 DIAGNOSIS — Z992 Dependence on renal dialysis: Secondary | ICD-10-CM | POA: Diagnosis not present

## 2017-09-13 DIAGNOSIS — D509 Iron deficiency anemia, unspecified: Secondary | ICD-10-CM | POA: Diagnosis not present

## 2017-09-13 DIAGNOSIS — N186 End stage renal disease: Secondary | ICD-10-CM | POA: Diagnosis not present

## 2017-09-13 DIAGNOSIS — N2581 Secondary hyperparathyroidism of renal origin: Secondary | ICD-10-CM | POA: Diagnosis not present

## 2017-09-13 DIAGNOSIS — Z992 Dependence on renal dialysis: Secondary | ICD-10-CM | POA: Diagnosis not present

## 2017-09-15 DIAGNOSIS — Z992 Dependence on renal dialysis: Secondary | ICD-10-CM | POA: Diagnosis not present

## 2017-09-15 DIAGNOSIS — N2581 Secondary hyperparathyroidism of renal origin: Secondary | ICD-10-CM | POA: Diagnosis not present

## 2017-09-15 DIAGNOSIS — N186 End stage renal disease: Secondary | ICD-10-CM | POA: Diagnosis not present

## 2017-09-15 DIAGNOSIS — D509 Iron deficiency anemia, unspecified: Secondary | ICD-10-CM | POA: Diagnosis not present

## 2017-09-17 DIAGNOSIS — Z992 Dependence on renal dialysis: Secondary | ICD-10-CM | POA: Diagnosis not present

## 2017-09-17 DIAGNOSIS — N186 End stage renal disease: Secondary | ICD-10-CM | POA: Diagnosis not present

## 2017-09-17 DIAGNOSIS — D509 Iron deficiency anemia, unspecified: Secondary | ICD-10-CM | POA: Diagnosis not present

## 2017-09-17 DIAGNOSIS — N2581 Secondary hyperparathyroidism of renal origin: Secondary | ICD-10-CM | POA: Diagnosis not present

## 2017-09-20 DIAGNOSIS — D509 Iron deficiency anemia, unspecified: Secondary | ICD-10-CM | POA: Diagnosis not present

## 2017-09-20 DIAGNOSIS — Z992 Dependence on renal dialysis: Secondary | ICD-10-CM | POA: Diagnosis not present

## 2017-09-20 DIAGNOSIS — N186 End stage renal disease: Secondary | ICD-10-CM | POA: Diagnosis not present

## 2017-09-20 DIAGNOSIS — N2581 Secondary hyperparathyroidism of renal origin: Secondary | ICD-10-CM | POA: Diagnosis not present

## 2017-09-21 DIAGNOSIS — N186 End stage renal disease: Secondary | ICD-10-CM | POA: Diagnosis not present

## 2017-09-21 DIAGNOSIS — Z992 Dependence on renal dialysis: Secondary | ICD-10-CM | POA: Diagnosis not present

## 2017-09-21 DIAGNOSIS — D631 Anemia in chronic kidney disease: Secondary | ICD-10-CM | POA: Diagnosis not present

## 2017-09-21 DIAGNOSIS — E611 Iron deficiency: Secondary | ICD-10-CM | POA: Diagnosis not present

## 2017-09-21 DIAGNOSIS — N2581 Secondary hyperparathyroidism of renal origin: Secondary | ICD-10-CM | POA: Diagnosis not present

## 2017-09-22 DIAGNOSIS — Z992 Dependence on renal dialysis: Secondary | ICD-10-CM | POA: Diagnosis not present

## 2017-09-22 DIAGNOSIS — N186 End stage renal disease: Secondary | ICD-10-CM | POA: Diagnosis not present

## 2017-09-22 DIAGNOSIS — D631 Anemia in chronic kidney disease: Secondary | ICD-10-CM | POA: Diagnosis not present

## 2017-09-22 DIAGNOSIS — N2581 Secondary hyperparathyroidism of renal origin: Secondary | ICD-10-CM | POA: Diagnosis not present

## 2017-09-22 DIAGNOSIS — E611 Iron deficiency: Secondary | ICD-10-CM | POA: Diagnosis not present

## 2017-09-24 DIAGNOSIS — D631 Anemia in chronic kidney disease: Secondary | ICD-10-CM | POA: Diagnosis not present

## 2017-09-24 DIAGNOSIS — E611 Iron deficiency: Secondary | ICD-10-CM | POA: Diagnosis not present

## 2017-09-24 DIAGNOSIS — N2581 Secondary hyperparathyroidism of renal origin: Secondary | ICD-10-CM | POA: Diagnosis not present

## 2017-09-24 DIAGNOSIS — Z992 Dependence on renal dialysis: Secondary | ICD-10-CM | POA: Diagnosis not present

## 2017-09-24 DIAGNOSIS — N186 End stage renal disease: Secondary | ICD-10-CM | POA: Diagnosis not present

## 2017-09-27 DIAGNOSIS — Z992 Dependence on renal dialysis: Secondary | ICD-10-CM | POA: Diagnosis not present

## 2017-09-27 DIAGNOSIS — N2581 Secondary hyperparathyroidism of renal origin: Secondary | ICD-10-CM | POA: Diagnosis not present

## 2017-09-27 DIAGNOSIS — N186 End stage renal disease: Secondary | ICD-10-CM | POA: Diagnosis not present

## 2017-09-27 DIAGNOSIS — D631 Anemia in chronic kidney disease: Secondary | ICD-10-CM | POA: Diagnosis not present

## 2017-09-27 DIAGNOSIS — E611 Iron deficiency: Secondary | ICD-10-CM | POA: Diagnosis not present

## 2017-09-29 DIAGNOSIS — N2581 Secondary hyperparathyroidism of renal origin: Secondary | ICD-10-CM | POA: Diagnosis not present

## 2017-09-29 DIAGNOSIS — E611 Iron deficiency: Secondary | ICD-10-CM | POA: Diagnosis not present

## 2017-09-29 DIAGNOSIS — Z992 Dependence on renal dialysis: Secondary | ICD-10-CM | POA: Diagnosis not present

## 2017-09-29 DIAGNOSIS — N186 End stage renal disease: Secondary | ICD-10-CM | POA: Diagnosis not present

## 2017-09-29 DIAGNOSIS — D631 Anemia in chronic kidney disease: Secondary | ICD-10-CM | POA: Diagnosis not present

## 2017-10-01 DIAGNOSIS — N186 End stage renal disease: Secondary | ICD-10-CM | POA: Diagnosis not present

## 2017-10-01 DIAGNOSIS — E611 Iron deficiency: Secondary | ICD-10-CM | POA: Diagnosis not present

## 2017-10-01 DIAGNOSIS — N2581 Secondary hyperparathyroidism of renal origin: Secondary | ICD-10-CM | POA: Diagnosis not present

## 2017-10-01 DIAGNOSIS — D631 Anemia in chronic kidney disease: Secondary | ICD-10-CM | POA: Diagnosis not present

## 2017-10-01 DIAGNOSIS — Z992 Dependence on renal dialysis: Secondary | ICD-10-CM | POA: Diagnosis not present

## 2017-10-04 DIAGNOSIS — Z992 Dependence on renal dialysis: Secondary | ICD-10-CM | POA: Diagnosis not present

## 2017-10-04 DIAGNOSIS — E611 Iron deficiency: Secondary | ICD-10-CM | POA: Diagnosis not present

## 2017-10-04 DIAGNOSIS — N2581 Secondary hyperparathyroidism of renal origin: Secondary | ICD-10-CM | POA: Diagnosis not present

## 2017-10-04 DIAGNOSIS — D631 Anemia in chronic kidney disease: Secondary | ICD-10-CM | POA: Diagnosis not present

## 2017-10-04 DIAGNOSIS — N186 End stage renal disease: Secondary | ICD-10-CM | POA: Diagnosis not present

## 2017-10-06 DIAGNOSIS — D631 Anemia in chronic kidney disease: Secondary | ICD-10-CM | POA: Diagnosis not present

## 2017-10-06 DIAGNOSIS — E611 Iron deficiency: Secondary | ICD-10-CM | POA: Diagnosis not present

## 2017-10-06 DIAGNOSIS — Z992 Dependence on renal dialysis: Secondary | ICD-10-CM | POA: Diagnosis not present

## 2017-10-06 DIAGNOSIS — N186 End stage renal disease: Secondary | ICD-10-CM | POA: Diagnosis not present

## 2017-10-06 DIAGNOSIS — N2581 Secondary hyperparathyroidism of renal origin: Secondary | ICD-10-CM | POA: Diagnosis not present

## 2017-10-08 DIAGNOSIS — D631 Anemia in chronic kidney disease: Secondary | ICD-10-CM | POA: Diagnosis not present

## 2017-10-08 DIAGNOSIS — Z992 Dependence on renal dialysis: Secondary | ICD-10-CM | POA: Diagnosis not present

## 2017-10-08 DIAGNOSIS — N2581 Secondary hyperparathyroidism of renal origin: Secondary | ICD-10-CM | POA: Diagnosis not present

## 2017-10-08 DIAGNOSIS — E611 Iron deficiency: Secondary | ICD-10-CM | POA: Diagnosis not present

## 2017-10-08 DIAGNOSIS — N186 End stage renal disease: Secondary | ICD-10-CM | POA: Diagnosis not present

## 2017-10-11 DIAGNOSIS — N186 End stage renal disease: Secondary | ICD-10-CM | POA: Diagnosis not present

## 2017-10-11 DIAGNOSIS — N2581 Secondary hyperparathyroidism of renal origin: Secondary | ICD-10-CM | POA: Diagnosis not present

## 2017-10-11 DIAGNOSIS — E611 Iron deficiency: Secondary | ICD-10-CM | POA: Diagnosis not present

## 2017-10-11 DIAGNOSIS — Z992 Dependence on renal dialysis: Secondary | ICD-10-CM | POA: Diagnosis not present

## 2017-10-11 DIAGNOSIS — D631 Anemia in chronic kidney disease: Secondary | ICD-10-CM | POA: Diagnosis not present

## 2017-10-13 DIAGNOSIS — N2581 Secondary hyperparathyroidism of renal origin: Secondary | ICD-10-CM | POA: Diagnosis not present

## 2017-10-13 DIAGNOSIS — Z992 Dependence on renal dialysis: Secondary | ICD-10-CM | POA: Diagnosis not present

## 2017-10-13 DIAGNOSIS — D631 Anemia in chronic kidney disease: Secondary | ICD-10-CM | POA: Diagnosis not present

## 2017-10-13 DIAGNOSIS — N186 End stage renal disease: Secondary | ICD-10-CM | POA: Diagnosis not present

## 2017-10-13 DIAGNOSIS — E611 Iron deficiency: Secondary | ICD-10-CM | POA: Diagnosis not present

## 2017-10-15 DIAGNOSIS — N186 End stage renal disease: Secondary | ICD-10-CM | POA: Diagnosis not present

## 2017-10-15 DIAGNOSIS — Z992 Dependence on renal dialysis: Secondary | ICD-10-CM | POA: Diagnosis not present

## 2017-10-15 DIAGNOSIS — N2581 Secondary hyperparathyroidism of renal origin: Secondary | ICD-10-CM | POA: Diagnosis not present

## 2017-10-15 DIAGNOSIS — E611 Iron deficiency: Secondary | ICD-10-CM | POA: Diagnosis not present

## 2017-10-15 DIAGNOSIS — D631 Anemia in chronic kidney disease: Secondary | ICD-10-CM | POA: Diagnosis not present

## 2017-10-18 DIAGNOSIS — N186 End stage renal disease: Secondary | ICD-10-CM | POA: Diagnosis not present

## 2017-10-18 DIAGNOSIS — N2581 Secondary hyperparathyroidism of renal origin: Secondary | ICD-10-CM | POA: Diagnosis not present

## 2017-10-18 DIAGNOSIS — D631 Anemia in chronic kidney disease: Secondary | ICD-10-CM | POA: Diagnosis not present

## 2017-10-18 DIAGNOSIS — Z992 Dependence on renal dialysis: Secondary | ICD-10-CM | POA: Diagnosis not present

## 2017-10-18 DIAGNOSIS — E611 Iron deficiency: Secondary | ICD-10-CM | POA: Diagnosis not present

## 2017-10-20 DIAGNOSIS — E611 Iron deficiency: Secondary | ICD-10-CM | POA: Diagnosis not present

## 2017-10-20 DIAGNOSIS — N186 End stage renal disease: Secondary | ICD-10-CM | POA: Diagnosis not present

## 2017-10-20 DIAGNOSIS — Z992 Dependence on renal dialysis: Secondary | ICD-10-CM | POA: Diagnosis not present

## 2017-10-20 DIAGNOSIS — N2581 Secondary hyperparathyroidism of renal origin: Secondary | ICD-10-CM | POA: Diagnosis not present

## 2017-10-20 DIAGNOSIS — D631 Anemia in chronic kidney disease: Secondary | ICD-10-CM | POA: Diagnosis not present

## 2017-10-21 DIAGNOSIS — N186 End stage renal disease: Secondary | ICD-10-CM | POA: Diagnosis not present

## 2017-10-21 DIAGNOSIS — Z992 Dependence on renal dialysis: Secondary | ICD-10-CM | POA: Diagnosis not present

## 2017-10-22 DIAGNOSIS — N2581 Secondary hyperparathyroidism of renal origin: Secondary | ICD-10-CM | POA: Diagnosis not present

## 2017-10-22 DIAGNOSIS — E611 Iron deficiency: Secondary | ICD-10-CM | POA: Diagnosis not present

## 2017-10-22 DIAGNOSIS — N186 End stage renal disease: Secondary | ICD-10-CM | POA: Diagnosis not present

## 2017-10-22 DIAGNOSIS — Z992 Dependence on renal dialysis: Secondary | ICD-10-CM | POA: Diagnosis not present

## 2017-10-22 DIAGNOSIS — D631 Anemia in chronic kidney disease: Secondary | ICD-10-CM | POA: Diagnosis not present

## 2017-10-25 DIAGNOSIS — N186 End stage renal disease: Secondary | ICD-10-CM | POA: Diagnosis not present

## 2017-10-25 DIAGNOSIS — E611 Iron deficiency: Secondary | ICD-10-CM | POA: Diagnosis not present

## 2017-10-25 DIAGNOSIS — D631 Anemia in chronic kidney disease: Secondary | ICD-10-CM | POA: Diagnosis not present

## 2017-10-25 DIAGNOSIS — N2581 Secondary hyperparathyroidism of renal origin: Secondary | ICD-10-CM | POA: Diagnosis not present

## 2017-10-25 DIAGNOSIS — Z992 Dependence on renal dialysis: Secondary | ICD-10-CM | POA: Diagnosis not present

## 2017-10-27 DIAGNOSIS — E611 Iron deficiency: Secondary | ICD-10-CM | POA: Diagnosis not present

## 2017-10-27 DIAGNOSIS — D631 Anemia in chronic kidney disease: Secondary | ICD-10-CM | POA: Diagnosis not present

## 2017-10-27 DIAGNOSIS — N2581 Secondary hyperparathyroidism of renal origin: Secondary | ICD-10-CM | POA: Diagnosis not present

## 2017-10-27 DIAGNOSIS — Z992 Dependence on renal dialysis: Secondary | ICD-10-CM | POA: Diagnosis not present

## 2017-10-27 DIAGNOSIS — N186 End stage renal disease: Secondary | ICD-10-CM | POA: Diagnosis not present

## 2017-10-29 DIAGNOSIS — D631 Anemia in chronic kidney disease: Secondary | ICD-10-CM | POA: Diagnosis not present

## 2017-10-29 DIAGNOSIS — N2581 Secondary hyperparathyroidism of renal origin: Secondary | ICD-10-CM | POA: Diagnosis not present

## 2017-10-29 DIAGNOSIS — Z992 Dependence on renal dialysis: Secondary | ICD-10-CM | POA: Diagnosis not present

## 2017-10-29 DIAGNOSIS — N186 End stage renal disease: Secondary | ICD-10-CM | POA: Diagnosis not present

## 2017-10-29 DIAGNOSIS — E611 Iron deficiency: Secondary | ICD-10-CM | POA: Diagnosis not present

## 2017-11-01 DIAGNOSIS — Z992 Dependence on renal dialysis: Secondary | ICD-10-CM | POA: Diagnosis not present

## 2017-11-01 DIAGNOSIS — N186 End stage renal disease: Secondary | ICD-10-CM | POA: Diagnosis not present

## 2017-11-01 DIAGNOSIS — N2581 Secondary hyperparathyroidism of renal origin: Secondary | ICD-10-CM | POA: Diagnosis not present

## 2017-11-01 DIAGNOSIS — E611 Iron deficiency: Secondary | ICD-10-CM | POA: Diagnosis not present

## 2017-11-01 DIAGNOSIS — D631 Anemia in chronic kidney disease: Secondary | ICD-10-CM | POA: Diagnosis not present

## 2017-11-03 DIAGNOSIS — D631 Anemia in chronic kidney disease: Secondary | ICD-10-CM | POA: Diagnosis not present

## 2017-11-03 DIAGNOSIS — Z992 Dependence on renal dialysis: Secondary | ICD-10-CM | POA: Diagnosis not present

## 2017-11-03 DIAGNOSIS — N186 End stage renal disease: Secondary | ICD-10-CM | POA: Diagnosis not present

## 2017-11-03 DIAGNOSIS — E611 Iron deficiency: Secondary | ICD-10-CM | POA: Diagnosis not present

## 2017-11-03 DIAGNOSIS — N2581 Secondary hyperparathyroidism of renal origin: Secondary | ICD-10-CM | POA: Diagnosis not present

## 2017-11-05 DIAGNOSIS — Z992 Dependence on renal dialysis: Secondary | ICD-10-CM | POA: Diagnosis not present

## 2017-11-05 DIAGNOSIS — N2581 Secondary hyperparathyroidism of renal origin: Secondary | ICD-10-CM | POA: Diagnosis not present

## 2017-11-05 DIAGNOSIS — E611 Iron deficiency: Secondary | ICD-10-CM | POA: Diagnosis not present

## 2017-11-05 DIAGNOSIS — D631 Anemia in chronic kidney disease: Secondary | ICD-10-CM | POA: Diagnosis not present

## 2017-11-05 DIAGNOSIS — N186 End stage renal disease: Secondary | ICD-10-CM | POA: Diagnosis not present

## 2017-11-08 DIAGNOSIS — D631 Anemia in chronic kidney disease: Secondary | ICD-10-CM | POA: Diagnosis not present

## 2017-11-08 DIAGNOSIS — E611 Iron deficiency: Secondary | ICD-10-CM | POA: Diagnosis not present

## 2017-11-08 DIAGNOSIS — Z992 Dependence on renal dialysis: Secondary | ICD-10-CM | POA: Diagnosis not present

## 2017-11-08 DIAGNOSIS — N186 End stage renal disease: Secondary | ICD-10-CM | POA: Diagnosis not present

## 2017-11-08 DIAGNOSIS — N2581 Secondary hyperparathyroidism of renal origin: Secondary | ICD-10-CM | POA: Diagnosis not present

## 2017-11-10 DIAGNOSIS — N2581 Secondary hyperparathyroidism of renal origin: Secondary | ICD-10-CM | POA: Diagnosis not present

## 2017-11-10 DIAGNOSIS — E611 Iron deficiency: Secondary | ICD-10-CM | POA: Diagnosis not present

## 2017-11-10 DIAGNOSIS — D631 Anemia in chronic kidney disease: Secondary | ICD-10-CM | POA: Diagnosis not present

## 2017-11-10 DIAGNOSIS — N186 End stage renal disease: Secondary | ICD-10-CM | POA: Diagnosis not present

## 2017-11-10 DIAGNOSIS — Z992 Dependence on renal dialysis: Secondary | ICD-10-CM | POA: Diagnosis not present

## 2017-11-12 DIAGNOSIS — N2581 Secondary hyperparathyroidism of renal origin: Secondary | ICD-10-CM | POA: Diagnosis not present

## 2017-11-12 DIAGNOSIS — N186 End stage renal disease: Secondary | ICD-10-CM | POA: Diagnosis not present

## 2017-11-12 DIAGNOSIS — D631 Anemia in chronic kidney disease: Secondary | ICD-10-CM | POA: Diagnosis not present

## 2017-11-12 DIAGNOSIS — E611 Iron deficiency: Secondary | ICD-10-CM | POA: Diagnosis not present

## 2017-11-12 DIAGNOSIS — Z992 Dependence on renal dialysis: Secondary | ICD-10-CM | POA: Diagnosis not present

## 2017-11-15 DIAGNOSIS — N2581 Secondary hyperparathyroidism of renal origin: Secondary | ICD-10-CM | POA: Diagnosis not present

## 2017-11-15 DIAGNOSIS — E611 Iron deficiency: Secondary | ICD-10-CM | POA: Diagnosis not present

## 2017-11-15 DIAGNOSIS — N186 End stage renal disease: Secondary | ICD-10-CM | POA: Diagnosis not present

## 2017-11-15 DIAGNOSIS — Z992 Dependence on renal dialysis: Secondary | ICD-10-CM | POA: Diagnosis not present

## 2017-11-15 DIAGNOSIS — D631 Anemia in chronic kidney disease: Secondary | ICD-10-CM | POA: Diagnosis not present

## 2017-11-17 DIAGNOSIS — N2581 Secondary hyperparathyroidism of renal origin: Secondary | ICD-10-CM | POA: Diagnosis not present

## 2017-11-17 DIAGNOSIS — Z992 Dependence on renal dialysis: Secondary | ICD-10-CM | POA: Diagnosis not present

## 2017-11-17 DIAGNOSIS — N186 End stage renal disease: Secondary | ICD-10-CM | POA: Diagnosis not present

## 2017-11-17 DIAGNOSIS — E611 Iron deficiency: Secondary | ICD-10-CM | POA: Diagnosis not present

## 2017-11-17 DIAGNOSIS — D631 Anemia in chronic kidney disease: Secondary | ICD-10-CM | POA: Diagnosis not present

## 2017-11-19 DIAGNOSIS — D631 Anemia in chronic kidney disease: Secondary | ICD-10-CM | POA: Diagnosis not present

## 2017-11-19 DIAGNOSIS — Z992 Dependence on renal dialysis: Secondary | ICD-10-CM | POA: Diagnosis not present

## 2017-11-19 DIAGNOSIS — N186 End stage renal disease: Secondary | ICD-10-CM | POA: Diagnosis not present

## 2017-11-19 DIAGNOSIS — N2581 Secondary hyperparathyroidism of renal origin: Secondary | ICD-10-CM | POA: Diagnosis not present

## 2017-11-19 DIAGNOSIS — E611 Iron deficiency: Secondary | ICD-10-CM | POA: Diagnosis not present

## 2017-11-20 DIAGNOSIS — Z992 Dependence on renal dialysis: Secondary | ICD-10-CM | POA: Diagnosis not present

## 2017-11-20 DIAGNOSIS — N186 End stage renal disease: Secondary | ICD-10-CM | POA: Diagnosis not present

## 2017-11-21 DIAGNOSIS — Z992 Dependence on renal dialysis: Secondary | ICD-10-CM | POA: Diagnosis not present

## 2017-11-21 DIAGNOSIS — E611 Iron deficiency: Secondary | ICD-10-CM | POA: Diagnosis not present

## 2017-11-21 DIAGNOSIS — D631 Anemia in chronic kidney disease: Secondary | ICD-10-CM | POA: Diagnosis not present

## 2017-11-21 DIAGNOSIS — N2581 Secondary hyperparathyroidism of renal origin: Secondary | ICD-10-CM | POA: Diagnosis not present

## 2017-11-21 DIAGNOSIS — N186 End stage renal disease: Secondary | ICD-10-CM | POA: Diagnosis not present

## 2017-11-21 DIAGNOSIS — D509 Iron deficiency anemia, unspecified: Secondary | ICD-10-CM | POA: Diagnosis not present

## 2017-11-22 DIAGNOSIS — N2581 Secondary hyperparathyroidism of renal origin: Secondary | ICD-10-CM | POA: Diagnosis not present

## 2017-11-22 DIAGNOSIS — Z992 Dependence on renal dialysis: Secondary | ICD-10-CM | POA: Diagnosis not present

## 2017-11-22 DIAGNOSIS — D631 Anemia in chronic kidney disease: Secondary | ICD-10-CM | POA: Diagnosis not present

## 2017-11-22 DIAGNOSIS — D509 Iron deficiency anemia, unspecified: Secondary | ICD-10-CM | POA: Diagnosis not present

## 2017-11-22 DIAGNOSIS — N186 End stage renal disease: Secondary | ICD-10-CM | POA: Diagnosis not present

## 2017-11-22 DIAGNOSIS — E611 Iron deficiency: Secondary | ICD-10-CM | POA: Diagnosis not present

## 2017-11-24 DIAGNOSIS — E611 Iron deficiency: Secondary | ICD-10-CM | POA: Diagnosis not present

## 2017-11-24 DIAGNOSIS — N2581 Secondary hyperparathyroidism of renal origin: Secondary | ICD-10-CM | POA: Diagnosis not present

## 2017-11-24 DIAGNOSIS — D509 Iron deficiency anemia, unspecified: Secondary | ICD-10-CM | POA: Diagnosis not present

## 2017-11-24 DIAGNOSIS — D631 Anemia in chronic kidney disease: Secondary | ICD-10-CM | POA: Diagnosis not present

## 2017-11-24 DIAGNOSIS — Z992 Dependence on renal dialysis: Secondary | ICD-10-CM | POA: Diagnosis not present

## 2017-11-24 DIAGNOSIS — N186 End stage renal disease: Secondary | ICD-10-CM | POA: Diagnosis not present

## 2017-11-26 DIAGNOSIS — D509 Iron deficiency anemia, unspecified: Secondary | ICD-10-CM | POA: Diagnosis not present

## 2017-11-26 DIAGNOSIS — N2581 Secondary hyperparathyroidism of renal origin: Secondary | ICD-10-CM | POA: Diagnosis not present

## 2017-11-26 DIAGNOSIS — N186 End stage renal disease: Secondary | ICD-10-CM | POA: Diagnosis not present

## 2017-11-26 DIAGNOSIS — Z992 Dependence on renal dialysis: Secondary | ICD-10-CM | POA: Diagnosis not present

## 2017-11-26 DIAGNOSIS — D631 Anemia in chronic kidney disease: Secondary | ICD-10-CM | POA: Diagnosis not present

## 2017-11-26 DIAGNOSIS — E611 Iron deficiency: Secondary | ICD-10-CM | POA: Diagnosis not present

## 2017-11-29 DIAGNOSIS — N186 End stage renal disease: Secondary | ICD-10-CM | POA: Diagnosis not present

## 2017-11-29 DIAGNOSIS — Z992 Dependence on renal dialysis: Secondary | ICD-10-CM | POA: Diagnosis not present

## 2017-11-29 DIAGNOSIS — E611 Iron deficiency: Secondary | ICD-10-CM | POA: Diagnosis not present

## 2017-11-29 DIAGNOSIS — N2581 Secondary hyperparathyroidism of renal origin: Secondary | ICD-10-CM | POA: Diagnosis not present

## 2017-11-29 DIAGNOSIS — D631 Anemia in chronic kidney disease: Secondary | ICD-10-CM | POA: Diagnosis not present

## 2017-11-29 DIAGNOSIS — D509 Iron deficiency anemia, unspecified: Secondary | ICD-10-CM | POA: Diagnosis not present

## 2017-12-01 DIAGNOSIS — E611 Iron deficiency: Secondary | ICD-10-CM | POA: Diagnosis not present

## 2017-12-01 DIAGNOSIS — N2581 Secondary hyperparathyroidism of renal origin: Secondary | ICD-10-CM | POA: Diagnosis not present

## 2017-12-01 DIAGNOSIS — D631 Anemia in chronic kidney disease: Secondary | ICD-10-CM | POA: Diagnosis not present

## 2017-12-01 DIAGNOSIS — D509 Iron deficiency anemia, unspecified: Secondary | ICD-10-CM | POA: Diagnosis not present

## 2017-12-01 DIAGNOSIS — N186 End stage renal disease: Secondary | ICD-10-CM | POA: Diagnosis not present

## 2017-12-01 DIAGNOSIS — Z992 Dependence on renal dialysis: Secondary | ICD-10-CM | POA: Diagnosis not present

## 2017-12-03 DIAGNOSIS — D631 Anemia in chronic kidney disease: Secondary | ICD-10-CM | POA: Diagnosis not present

## 2017-12-03 DIAGNOSIS — N186 End stage renal disease: Secondary | ICD-10-CM | POA: Diagnosis not present

## 2017-12-03 DIAGNOSIS — N2581 Secondary hyperparathyroidism of renal origin: Secondary | ICD-10-CM | POA: Diagnosis not present

## 2017-12-03 DIAGNOSIS — E611 Iron deficiency: Secondary | ICD-10-CM | POA: Diagnosis not present

## 2017-12-03 DIAGNOSIS — D509 Iron deficiency anemia, unspecified: Secondary | ICD-10-CM | POA: Diagnosis not present

## 2017-12-03 DIAGNOSIS — Z992 Dependence on renal dialysis: Secondary | ICD-10-CM | POA: Diagnosis not present

## 2017-12-06 DIAGNOSIS — N2581 Secondary hyperparathyroidism of renal origin: Secondary | ICD-10-CM | POA: Diagnosis not present

## 2017-12-06 DIAGNOSIS — N186 End stage renal disease: Secondary | ICD-10-CM | POA: Diagnosis not present

## 2017-12-06 DIAGNOSIS — Z992 Dependence on renal dialysis: Secondary | ICD-10-CM | POA: Diagnosis not present

## 2017-12-06 DIAGNOSIS — D509 Iron deficiency anemia, unspecified: Secondary | ICD-10-CM | POA: Diagnosis not present

## 2017-12-06 DIAGNOSIS — D631 Anemia in chronic kidney disease: Secondary | ICD-10-CM | POA: Diagnosis not present

## 2017-12-06 DIAGNOSIS — E611 Iron deficiency: Secondary | ICD-10-CM | POA: Diagnosis not present

## 2017-12-08 DIAGNOSIS — N186 End stage renal disease: Secondary | ICD-10-CM | POA: Diagnosis not present

## 2017-12-08 DIAGNOSIS — E611 Iron deficiency: Secondary | ICD-10-CM | POA: Diagnosis not present

## 2017-12-08 DIAGNOSIS — D631 Anemia in chronic kidney disease: Secondary | ICD-10-CM | POA: Diagnosis not present

## 2017-12-08 DIAGNOSIS — N2581 Secondary hyperparathyroidism of renal origin: Secondary | ICD-10-CM | POA: Diagnosis not present

## 2017-12-08 DIAGNOSIS — Z992 Dependence on renal dialysis: Secondary | ICD-10-CM | POA: Diagnosis not present

## 2017-12-08 DIAGNOSIS — D509 Iron deficiency anemia, unspecified: Secondary | ICD-10-CM | POA: Diagnosis not present

## 2017-12-10 DIAGNOSIS — E611 Iron deficiency: Secondary | ICD-10-CM | POA: Diagnosis not present

## 2017-12-10 DIAGNOSIS — Z992 Dependence on renal dialysis: Secondary | ICD-10-CM | POA: Diagnosis not present

## 2017-12-10 DIAGNOSIS — D631 Anemia in chronic kidney disease: Secondary | ICD-10-CM | POA: Diagnosis not present

## 2017-12-10 DIAGNOSIS — N2581 Secondary hyperparathyroidism of renal origin: Secondary | ICD-10-CM | POA: Diagnosis not present

## 2017-12-10 DIAGNOSIS — N186 End stage renal disease: Secondary | ICD-10-CM | POA: Diagnosis not present

## 2017-12-10 DIAGNOSIS — D509 Iron deficiency anemia, unspecified: Secondary | ICD-10-CM | POA: Diagnosis not present

## 2017-12-13 DIAGNOSIS — E611 Iron deficiency: Secondary | ICD-10-CM | POA: Diagnosis not present

## 2017-12-13 DIAGNOSIS — Z992 Dependence on renal dialysis: Secondary | ICD-10-CM | POA: Diagnosis not present

## 2017-12-13 DIAGNOSIS — N2581 Secondary hyperparathyroidism of renal origin: Secondary | ICD-10-CM | POA: Diagnosis not present

## 2017-12-13 DIAGNOSIS — D509 Iron deficiency anemia, unspecified: Secondary | ICD-10-CM | POA: Diagnosis not present

## 2017-12-13 DIAGNOSIS — N186 End stage renal disease: Secondary | ICD-10-CM | POA: Diagnosis not present

## 2017-12-13 DIAGNOSIS — D631 Anemia in chronic kidney disease: Secondary | ICD-10-CM | POA: Diagnosis not present

## 2017-12-15 DIAGNOSIS — D631 Anemia in chronic kidney disease: Secondary | ICD-10-CM | POA: Diagnosis not present

## 2017-12-15 DIAGNOSIS — Z992 Dependence on renal dialysis: Secondary | ICD-10-CM | POA: Diagnosis not present

## 2017-12-15 DIAGNOSIS — E611 Iron deficiency: Secondary | ICD-10-CM | POA: Diagnosis not present

## 2017-12-15 DIAGNOSIS — D509 Iron deficiency anemia, unspecified: Secondary | ICD-10-CM | POA: Diagnosis not present

## 2017-12-15 DIAGNOSIS — N186 End stage renal disease: Secondary | ICD-10-CM | POA: Diagnosis not present

## 2017-12-15 DIAGNOSIS — N2581 Secondary hyperparathyroidism of renal origin: Secondary | ICD-10-CM | POA: Diagnosis not present

## 2017-12-17 DIAGNOSIS — N186 End stage renal disease: Secondary | ICD-10-CM | POA: Diagnosis not present

## 2017-12-17 DIAGNOSIS — E611 Iron deficiency: Secondary | ICD-10-CM | POA: Diagnosis not present

## 2017-12-17 DIAGNOSIS — D509 Iron deficiency anemia, unspecified: Secondary | ICD-10-CM | POA: Diagnosis not present

## 2017-12-17 DIAGNOSIS — N2581 Secondary hyperparathyroidism of renal origin: Secondary | ICD-10-CM | POA: Diagnosis not present

## 2017-12-17 DIAGNOSIS — D631 Anemia in chronic kidney disease: Secondary | ICD-10-CM | POA: Diagnosis not present

## 2017-12-17 DIAGNOSIS — Z992 Dependence on renal dialysis: Secondary | ICD-10-CM | POA: Diagnosis not present

## 2017-12-20 DIAGNOSIS — E611 Iron deficiency: Secondary | ICD-10-CM | POA: Diagnosis not present

## 2017-12-20 DIAGNOSIS — D631 Anemia in chronic kidney disease: Secondary | ICD-10-CM | POA: Diagnosis not present

## 2017-12-20 DIAGNOSIS — N186 End stage renal disease: Secondary | ICD-10-CM | POA: Diagnosis not present

## 2017-12-20 DIAGNOSIS — Z992 Dependence on renal dialysis: Secondary | ICD-10-CM | POA: Diagnosis not present

## 2017-12-20 DIAGNOSIS — N2581 Secondary hyperparathyroidism of renal origin: Secondary | ICD-10-CM | POA: Diagnosis not present

## 2017-12-20 DIAGNOSIS — D509 Iron deficiency anemia, unspecified: Secondary | ICD-10-CM | POA: Diagnosis not present

## 2017-12-21 DIAGNOSIS — Z992 Dependence on renal dialysis: Secondary | ICD-10-CM | POA: Diagnosis not present

## 2017-12-21 DIAGNOSIS — N186 End stage renal disease: Secondary | ICD-10-CM | POA: Diagnosis not present

## 2017-12-22 DIAGNOSIS — N2581 Secondary hyperparathyroidism of renal origin: Secondary | ICD-10-CM | POA: Diagnosis not present

## 2017-12-22 DIAGNOSIS — N186 End stage renal disease: Secondary | ICD-10-CM | POA: Diagnosis not present

## 2017-12-22 DIAGNOSIS — D631 Anemia in chronic kidney disease: Secondary | ICD-10-CM | POA: Diagnosis not present

## 2017-12-22 DIAGNOSIS — Z992 Dependence on renal dialysis: Secondary | ICD-10-CM | POA: Diagnosis not present

## 2017-12-22 DIAGNOSIS — E611 Iron deficiency: Secondary | ICD-10-CM | POA: Diagnosis not present

## 2017-12-24 DIAGNOSIS — D631 Anemia in chronic kidney disease: Secondary | ICD-10-CM | POA: Diagnosis not present

## 2017-12-24 DIAGNOSIS — E611 Iron deficiency: Secondary | ICD-10-CM | POA: Diagnosis not present

## 2017-12-24 DIAGNOSIS — Z992 Dependence on renal dialysis: Secondary | ICD-10-CM | POA: Diagnosis not present

## 2017-12-24 DIAGNOSIS — N2581 Secondary hyperparathyroidism of renal origin: Secondary | ICD-10-CM | POA: Diagnosis not present

## 2017-12-24 DIAGNOSIS — N186 End stage renal disease: Secondary | ICD-10-CM | POA: Diagnosis not present

## 2017-12-27 DIAGNOSIS — Z992 Dependence on renal dialysis: Secondary | ICD-10-CM | POA: Diagnosis not present

## 2017-12-27 DIAGNOSIS — N186 End stage renal disease: Secondary | ICD-10-CM | POA: Diagnosis not present

## 2017-12-27 DIAGNOSIS — N2581 Secondary hyperparathyroidism of renal origin: Secondary | ICD-10-CM | POA: Diagnosis not present

## 2017-12-27 DIAGNOSIS — D631 Anemia in chronic kidney disease: Secondary | ICD-10-CM | POA: Diagnosis not present

## 2017-12-27 DIAGNOSIS — E611 Iron deficiency: Secondary | ICD-10-CM | POA: Diagnosis not present

## 2017-12-29 DIAGNOSIS — E611 Iron deficiency: Secondary | ICD-10-CM | POA: Diagnosis not present

## 2017-12-29 DIAGNOSIS — N186 End stage renal disease: Secondary | ICD-10-CM | POA: Diagnosis not present

## 2017-12-29 DIAGNOSIS — D631 Anemia in chronic kidney disease: Secondary | ICD-10-CM | POA: Diagnosis not present

## 2017-12-29 DIAGNOSIS — N2581 Secondary hyperparathyroidism of renal origin: Secondary | ICD-10-CM | POA: Diagnosis not present

## 2017-12-29 DIAGNOSIS — Z992 Dependence on renal dialysis: Secondary | ICD-10-CM | POA: Diagnosis not present

## 2017-12-31 DIAGNOSIS — N186 End stage renal disease: Secondary | ICD-10-CM | POA: Diagnosis not present

## 2017-12-31 DIAGNOSIS — N2581 Secondary hyperparathyroidism of renal origin: Secondary | ICD-10-CM | POA: Diagnosis not present

## 2017-12-31 DIAGNOSIS — D631 Anemia in chronic kidney disease: Secondary | ICD-10-CM | POA: Diagnosis not present

## 2017-12-31 DIAGNOSIS — E611 Iron deficiency: Secondary | ICD-10-CM | POA: Diagnosis not present

## 2017-12-31 DIAGNOSIS — Z992 Dependence on renal dialysis: Secondary | ICD-10-CM | POA: Diagnosis not present

## 2018-01-02 DIAGNOSIS — Z0000 Encounter for general adult medical examination without abnormal findings: Secondary | ICD-10-CM | POA: Diagnosis not present

## 2018-01-03 DIAGNOSIS — N2581 Secondary hyperparathyroidism of renal origin: Secondary | ICD-10-CM | POA: Diagnosis not present

## 2018-01-03 DIAGNOSIS — Z992 Dependence on renal dialysis: Secondary | ICD-10-CM | POA: Diagnosis not present

## 2018-01-03 DIAGNOSIS — N186 End stage renal disease: Secondary | ICD-10-CM | POA: Diagnosis not present

## 2018-01-03 DIAGNOSIS — E611 Iron deficiency: Secondary | ICD-10-CM | POA: Diagnosis not present

## 2018-01-03 DIAGNOSIS — D631 Anemia in chronic kidney disease: Secondary | ICD-10-CM | POA: Diagnosis not present

## 2018-01-05 DIAGNOSIS — N2581 Secondary hyperparathyroidism of renal origin: Secondary | ICD-10-CM | POA: Diagnosis not present

## 2018-01-05 DIAGNOSIS — E611 Iron deficiency: Secondary | ICD-10-CM | POA: Diagnosis not present

## 2018-01-05 DIAGNOSIS — Z992 Dependence on renal dialysis: Secondary | ICD-10-CM | POA: Diagnosis not present

## 2018-01-05 DIAGNOSIS — D631 Anemia in chronic kidney disease: Secondary | ICD-10-CM | POA: Diagnosis not present

## 2018-01-05 DIAGNOSIS — N186 End stage renal disease: Secondary | ICD-10-CM | POA: Diagnosis not present

## 2018-01-07 DIAGNOSIS — N2581 Secondary hyperparathyroidism of renal origin: Secondary | ICD-10-CM | POA: Diagnosis not present

## 2018-01-07 DIAGNOSIS — E611 Iron deficiency: Secondary | ICD-10-CM | POA: Diagnosis not present

## 2018-01-07 DIAGNOSIS — N186 End stage renal disease: Secondary | ICD-10-CM | POA: Diagnosis not present

## 2018-01-07 DIAGNOSIS — D631 Anemia in chronic kidney disease: Secondary | ICD-10-CM | POA: Diagnosis not present

## 2018-01-07 DIAGNOSIS — Z992 Dependence on renal dialysis: Secondary | ICD-10-CM | POA: Diagnosis not present

## 2018-01-10 DIAGNOSIS — E611 Iron deficiency: Secondary | ICD-10-CM | POA: Diagnosis not present

## 2018-01-10 DIAGNOSIS — D631 Anemia in chronic kidney disease: Secondary | ICD-10-CM | POA: Diagnosis not present

## 2018-01-10 DIAGNOSIS — Z992 Dependence on renal dialysis: Secondary | ICD-10-CM | POA: Diagnosis not present

## 2018-01-10 DIAGNOSIS — N186 End stage renal disease: Secondary | ICD-10-CM | POA: Diagnosis not present

## 2018-01-10 DIAGNOSIS — N2581 Secondary hyperparathyroidism of renal origin: Secondary | ICD-10-CM | POA: Diagnosis not present

## 2018-01-12 DIAGNOSIS — D631 Anemia in chronic kidney disease: Secondary | ICD-10-CM | POA: Diagnosis not present

## 2018-01-12 DIAGNOSIS — N2581 Secondary hyperparathyroidism of renal origin: Secondary | ICD-10-CM | POA: Diagnosis not present

## 2018-01-12 DIAGNOSIS — E611 Iron deficiency: Secondary | ICD-10-CM | POA: Diagnosis not present

## 2018-01-12 DIAGNOSIS — N186 End stage renal disease: Secondary | ICD-10-CM | POA: Diagnosis not present

## 2018-01-12 DIAGNOSIS — Z992 Dependence on renal dialysis: Secondary | ICD-10-CM | POA: Diagnosis not present

## 2018-01-14 DIAGNOSIS — E611 Iron deficiency: Secondary | ICD-10-CM | POA: Diagnosis not present

## 2018-01-14 DIAGNOSIS — D631 Anemia in chronic kidney disease: Secondary | ICD-10-CM | POA: Diagnosis not present

## 2018-01-14 DIAGNOSIS — N186 End stage renal disease: Secondary | ICD-10-CM | POA: Diagnosis not present

## 2018-01-14 DIAGNOSIS — N2581 Secondary hyperparathyroidism of renal origin: Secondary | ICD-10-CM | POA: Diagnosis not present

## 2018-01-14 DIAGNOSIS — Z992 Dependence on renal dialysis: Secondary | ICD-10-CM | POA: Diagnosis not present

## 2018-01-16 DIAGNOSIS — N2581 Secondary hyperparathyroidism of renal origin: Secondary | ICD-10-CM | POA: Diagnosis not present

## 2018-01-16 DIAGNOSIS — E611 Iron deficiency: Secondary | ICD-10-CM | POA: Diagnosis not present

## 2018-01-16 DIAGNOSIS — Z992 Dependence on renal dialysis: Secondary | ICD-10-CM | POA: Diagnosis not present

## 2018-01-16 DIAGNOSIS — N186 End stage renal disease: Secondary | ICD-10-CM | POA: Diagnosis not present

## 2018-01-16 DIAGNOSIS — D631 Anemia in chronic kidney disease: Secondary | ICD-10-CM | POA: Diagnosis not present

## 2018-01-17 DIAGNOSIS — Z992 Dependence on renal dialysis: Secondary | ICD-10-CM | POA: Diagnosis not present

## 2018-01-17 DIAGNOSIS — E611 Iron deficiency: Secondary | ICD-10-CM | POA: Diagnosis not present

## 2018-01-17 DIAGNOSIS — N2581 Secondary hyperparathyroidism of renal origin: Secondary | ICD-10-CM | POA: Diagnosis not present

## 2018-01-17 DIAGNOSIS — D631 Anemia in chronic kidney disease: Secondary | ICD-10-CM | POA: Diagnosis not present

## 2018-01-17 DIAGNOSIS — N186 End stage renal disease: Secondary | ICD-10-CM | POA: Diagnosis not present

## 2018-01-19 DIAGNOSIS — Z992 Dependence on renal dialysis: Secondary | ICD-10-CM | POA: Diagnosis not present

## 2018-01-19 DIAGNOSIS — N186 End stage renal disease: Secondary | ICD-10-CM | POA: Diagnosis not present

## 2018-01-19 DIAGNOSIS — N2581 Secondary hyperparathyroidism of renal origin: Secondary | ICD-10-CM | POA: Diagnosis not present

## 2018-01-19 DIAGNOSIS — E611 Iron deficiency: Secondary | ICD-10-CM | POA: Diagnosis not present

## 2018-01-19 DIAGNOSIS — D631 Anemia in chronic kidney disease: Secondary | ICD-10-CM | POA: Diagnosis not present

## 2018-01-21 DIAGNOSIS — N186 End stage renal disease: Secondary | ICD-10-CM | POA: Diagnosis not present

## 2018-01-21 DIAGNOSIS — N2581 Secondary hyperparathyroidism of renal origin: Secondary | ICD-10-CM | POA: Diagnosis not present

## 2018-01-21 DIAGNOSIS — E611 Iron deficiency: Secondary | ICD-10-CM | POA: Diagnosis not present

## 2018-01-21 DIAGNOSIS — Z992 Dependence on renal dialysis: Secondary | ICD-10-CM | POA: Diagnosis not present

## 2018-01-21 DIAGNOSIS — D631 Anemia in chronic kidney disease: Secondary | ICD-10-CM | POA: Diagnosis not present

## 2018-01-22 DIAGNOSIS — Z992 Dependence on renal dialysis: Secondary | ICD-10-CM | POA: Diagnosis not present

## 2018-01-22 DIAGNOSIS — N2581 Secondary hyperparathyroidism of renal origin: Secondary | ICD-10-CM | POA: Diagnosis not present

## 2018-01-22 DIAGNOSIS — D631 Anemia in chronic kidney disease: Secondary | ICD-10-CM | POA: Diagnosis not present

## 2018-01-22 DIAGNOSIS — N186 End stage renal disease: Secondary | ICD-10-CM | POA: Diagnosis not present

## 2018-01-22 DIAGNOSIS — E611 Iron deficiency: Secondary | ICD-10-CM | POA: Diagnosis not present

## 2018-01-24 DIAGNOSIS — N186 End stage renal disease: Secondary | ICD-10-CM | POA: Diagnosis not present

## 2018-01-24 DIAGNOSIS — D631 Anemia in chronic kidney disease: Secondary | ICD-10-CM | POA: Diagnosis not present

## 2018-01-24 DIAGNOSIS — E611 Iron deficiency: Secondary | ICD-10-CM | POA: Diagnosis not present

## 2018-01-24 DIAGNOSIS — Z992 Dependence on renal dialysis: Secondary | ICD-10-CM | POA: Diagnosis not present

## 2018-01-24 DIAGNOSIS — N2581 Secondary hyperparathyroidism of renal origin: Secondary | ICD-10-CM | POA: Diagnosis not present

## 2018-01-26 DIAGNOSIS — Z992 Dependence on renal dialysis: Secondary | ICD-10-CM | POA: Diagnosis not present

## 2018-01-26 DIAGNOSIS — N2581 Secondary hyperparathyroidism of renal origin: Secondary | ICD-10-CM | POA: Diagnosis not present

## 2018-01-26 DIAGNOSIS — N186 End stage renal disease: Secondary | ICD-10-CM | POA: Diagnosis not present

## 2018-01-26 DIAGNOSIS — E611 Iron deficiency: Secondary | ICD-10-CM | POA: Diagnosis not present

## 2018-01-26 DIAGNOSIS — D631 Anemia in chronic kidney disease: Secondary | ICD-10-CM | POA: Diagnosis not present

## 2018-01-28 DIAGNOSIS — D631 Anemia in chronic kidney disease: Secondary | ICD-10-CM | POA: Diagnosis not present

## 2018-01-28 DIAGNOSIS — N186 End stage renal disease: Secondary | ICD-10-CM | POA: Diagnosis not present

## 2018-01-28 DIAGNOSIS — N2581 Secondary hyperparathyroidism of renal origin: Secondary | ICD-10-CM | POA: Diagnosis not present

## 2018-01-28 DIAGNOSIS — Z992 Dependence on renal dialysis: Secondary | ICD-10-CM | POA: Diagnosis not present

## 2018-01-28 DIAGNOSIS — E611 Iron deficiency: Secondary | ICD-10-CM | POA: Diagnosis not present

## 2018-01-31 DIAGNOSIS — N186 End stage renal disease: Secondary | ICD-10-CM | POA: Diagnosis not present

## 2018-01-31 DIAGNOSIS — D631 Anemia in chronic kidney disease: Secondary | ICD-10-CM | POA: Diagnosis not present

## 2018-01-31 DIAGNOSIS — E611 Iron deficiency: Secondary | ICD-10-CM | POA: Diagnosis not present

## 2018-01-31 DIAGNOSIS — Z992 Dependence on renal dialysis: Secondary | ICD-10-CM | POA: Diagnosis not present

## 2018-01-31 DIAGNOSIS — N2581 Secondary hyperparathyroidism of renal origin: Secondary | ICD-10-CM | POA: Diagnosis not present

## 2018-02-02 DIAGNOSIS — D631 Anemia in chronic kidney disease: Secondary | ICD-10-CM | POA: Diagnosis not present

## 2018-02-02 DIAGNOSIS — Z992 Dependence on renal dialysis: Secondary | ICD-10-CM | POA: Diagnosis not present

## 2018-02-02 DIAGNOSIS — E611 Iron deficiency: Secondary | ICD-10-CM | POA: Diagnosis not present

## 2018-02-02 DIAGNOSIS — N186 End stage renal disease: Secondary | ICD-10-CM | POA: Diagnosis not present

## 2018-02-02 DIAGNOSIS — N2581 Secondary hyperparathyroidism of renal origin: Secondary | ICD-10-CM | POA: Diagnosis not present

## 2018-02-04 DIAGNOSIS — D631 Anemia in chronic kidney disease: Secondary | ICD-10-CM | POA: Diagnosis not present

## 2018-02-04 DIAGNOSIS — Z992 Dependence on renal dialysis: Secondary | ICD-10-CM | POA: Diagnosis not present

## 2018-02-04 DIAGNOSIS — E611 Iron deficiency: Secondary | ICD-10-CM | POA: Diagnosis not present

## 2018-02-04 DIAGNOSIS — N2581 Secondary hyperparathyroidism of renal origin: Secondary | ICD-10-CM | POA: Diagnosis not present

## 2018-02-04 DIAGNOSIS — N186 End stage renal disease: Secondary | ICD-10-CM | POA: Diagnosis not present

## 2018-02-07 DIAGNOSIS — D631 Anemia in chronic kidney disease: Secondary | ICD-10-CM | POA: Diagnosis not present

## 2018-02-07 DIAGNOSIS — E611 Iron deficiency: Secondary | ICD-10-CM | POA: Diagnosis not present

## 2018-02-07 DIAGNOSIS — N2581 Secondary hyperparathyroidism of renal origin: Secondary | ICD-10-CM | POA: Diagnosis not present

## 2018-02-07 DIAGNOSIS — N186 End stage renal disease: Secondary | ICD-10-CM | POA: Diagnosis not present

## 2018-02-07 DIAGNOSIS — Z992 Dependence on renal dialysis: Secondary | ICD-10-CM | POA: Diagnosis not present

## 2018-02-09 DIAGNOSIS — Z992 Dependence on renal dialysis: Secondary | ICD-10-CM | POA: Diagnosis not present

## 2018-02-09 DIAGNOSIS — D631 Anemia in chronic kidney disease: Secondary | ICD-10-CM | POA: Diagnosis not present

## 2018-02-09 DIAGNOSIS — E611 Iron deficiency: Secondary | ICD-10-CM | POA: Diagnosis not present

## 2018-02-09 DIAGNOSIS — N2581 Secondary hyperparathyroidism of renal origin: Secondary | ICD-10-CM | POA: Diagnosis not present

## 2018-02-09 DIAGNOSIS — N186 End stage renal disease: Secondary | ICD-10-CM | POA: Diagnosis not present

## 2018-02-11 DIAGNOSIS — E611 Iron deficiency: Secondary | ICD-10-CM | POA: Diagnosis not present

## 2018-02-11 DIAGNOSIS — N2581 Secondary hyperparathyroidism of renal origin: Secondary | ICD-10-CM | POA: Diagnosis not present

## 2018-02-11 DIAGNOSIS — Z992 Dependence on renal dialysis: Secondary | ICD-10-CM | POA: Diagnosis not present

## 2018-02-11 DIAGNOSIS — N186 End stage renal disease: Secondary | ICD-10-CM | POA: Diagnosis not present

## 2018-02-11 DIAGNOSIS — D631 Anemia in chronic kidney disease: Secondary | ICD-10-CM | POA: Diagnosis not present

## 2018-02-14 DIAGNOSIS — E611 Iron deficiency: Secondary | ICD-10-CM | POA: Diagnosis not present

## 2018-02-14 DIAGNOSIS — N186 End stage renal disease: Secondary | ICD-10-CM | POA: Diagnosis not present

## 2018-02-14 DIAGNOSIS — D631 Anemia in chronic kidney disease: Secondary | ICD-10-CM | POA: Diagnosis not present

## 2018-02-14 DIAGNOSIS — N2581 Secondary hyperparathyroidism of renal origin: Secondary | ICD-10-CM | POA: Diagnosis not present

## 2018-02-14 DIAGNOSIS — Z992 Dependence on renal dialysis: Secondary | ICD-10-CM | POA: Diagnosis not present

## 2018-02-15 DIAGNOSIS — D631 Anemia in chronic kidney disease: Secondary | ICD-10-CM | POA: Diagnosis not present

## 2018-02-15 DIAGNOSIS — N186 End stage renal disease: Secondary | ICD-10-CM | POA: Diagnosis not present

## 2018-02-15 DIAGNOSIS — N2581 Secondary hyperparathyroidism of renal origin: Secondary | ICD-10-CM | POA: Diagnosis not present

## 2018-02-15 DIAGNOSIS — Z992 Dependence on renal dialysis: Secondary | ICD-10-CM | POA: Diagnosis not present

## 2018-02-15 DIAGNOSIS — E611 Iron deficiency: Secondary | ICD-10-CM | POA: Diagnosis not present

## 2018-02-16 DIAGNOSIS — N186 End stage renal disease: Secondary | ICD-10-CM | POA: Diagnosis not present

## 2018-02-16 DIAGNOSIS — D631 Anemia in chronic kidney disease: Secondary | ICD-10-CM | POA: Diagnosis not present

## 2018-02-16 DIAGNOSIS — Z992 Dependence on renal dialysis: Secondary | ICD-10-CM | POA: Diagnosis not present

## 2018-02-16 DIAGNOSIS — N2581 Secondary hyperparathyroidism of renal origin: Secondary | ICD-10-CM | POA: Diagnosis not present

## 2018-02-16 DIAGNOSIS — E611 Iron deficiency: Secondary | ICD-10-CM | POA: Diagnosis not present

## 2018-02-18 DIAGNOSIS — N2581 Secondary hyperparathyroidism of renal origin: Secondary | ICD-10-CM | POA: Diagnosis not present

## 2018-02-18 DIAGNOSIS — N186 End stage renal disease: Secondary | ICD-10-CM | POA: Diagnosis not present

## 2018-02-18 DIAGNOSIS — D631 Anemia in chronic kidney disease: Secondary | ICD-10-CM | POA: Diagnosis not present

## 2018-02-18 DIAGNOSIS — E611 Iron deficiency: Secondary | ICD-10-CM | POA: Diagnosis not present

## 2018-02-18 DIAGNOSIS — Z992 Dependence on renal dialysis: Secondary | ICD-10-CM | POA: Diagnosis not present

## 2018-02-20 DIAGNOSIS — N186 End stage renal disease: Secondary | ICD-10-CM | POA: Diagnosis not present

## 2018-02-20 DIAGNOSIS — Z992 Dependence on renal dialysis: Secondary | ICD-10-CM | POA: Diagnosis not present

## 2018-02-21 DIAGNOSIS — D509 Iron deficiency anemia, unspecified: Secondary | ICD-10-CM | POA: Diagnosis not present

## 2018-02-21 DIAGNOSIS — N186 End stage renal disease: Secondary | ICD-10-CM | POA: Diagnosis not present

## 2018-02-21 DIAGNOSIS — N2581 Secondary hyperparathyroidism of renal origin: Secondary | ICD-10-CM | POA: Diagnosis not present

## 2018-02-21 DIAGNOSIS — E611 Iron deficiency: Secondary | ICD-10-CM | POA: Diagnosis not present

## 2018-02-21 DIAGNOSIS — Z992 Dependence on renal dialysis: Secondary | ICD-10-CM | POA: Diagnosis not present

## 2018-02-21 DIAGNOSIS — Z23 Encounter for immunization: Secondary | ICD-10-CM | POA: Diagnosis not present

## 2018-02-21 DIAGNOSIS — D631 Anemia in chronic kidney disease: Secondary | ICD-10-CM | POA: Diagnosis not present

## 2018-02-23 DIAGNOSIS — Z992 Dependence on renal dialysis: Secondary | ICD-10-CM | POA: Diagnosis not present

## 2018-02-23 DIAGNOSIS — N186 End stage renal disease: Secondary | ICD-10-CM | POA: Diagnosis not present

## 2018-02-23 DIAGNOSIS — Z23 Encounter for immunization: Secondary | ICD-10-CM | POA: Diagnosis not present

## 2018-02-23 DIAGNOSIS — D509 Iron deficiency anemia, unspecified: Secondary | ICD-10-CM | POA: Diagnosis not present

## 2018-02-23 DIAGNOSIS — E611 Iron deficiency: Secondary | ICD-10-CM | POA: Diagnosis not present

## 2018-02-23 DIAGNOSIS — N2581 Secondary hyperparathyroidism of renal origin: Secondary | ICD-10-CM | POA: Diagnosis not present

## 2018-02-25 DIAGNOSIS — E611 Iron deficiency: Secondary | ICD-10-CM | POA: Diagnosis not present

## 2018-02-25 DIAGNOSIS — Z23 Encounter for immunization: Secondary | ICD-10-CM | POA: Diagnosis not present

## 2018-02-25 DIAGNOSIS — D509 Iron deficiency anemia, unspecified: Secondary | ICD-10-CM | POA: Diagnosis not present

## 2018-02-25 DIAGNOSIS — N186 End stage renal disease: Secondary | ICD-10-CM | POA: Diagnosis not present

## 2018-02-25 DIAGNOSIS — Z992 Dependence on renal dialysis: Secondary | ICD-10-CM | POA: Diagnosis not present

## 2018-02-25 DIAGNOSIS — N2581 Secondary hyperparathyroidism of renal origin: Secondary | ICD-10-CM | POA: Diagnosis not present

## 2018-02-28 DIAGNOSIS — E611 Iron deficiency: Secondary | ICD-10-CM | POA: Diagnosis not present

## 2018-02-28 DIAGNOSIS — Z992 Dependence on renal dialysis: Secondary | ICD-10-CM | POA: Diagnosis not present

## 2018-02-28 DIAGNOSIS — N2581 Secondary hyperparathyroidism of renal origin: Secondary | ICD-10-CM | POA: Diagnosis not present

## 2018-02-28 DIAGNOSIS — Z23 Encounter for immunization: Secondary | ICD-10-CM | POA: Diagnosis not present

## 2018-02-28 DIAGNOSIS — D509 Iron deficiency anemia, unspecified: Secondary | ICD-10-CM | POA: Diagnosis not present

## 2018-02-28 DIAGNOSIS — N186 End stage renal disease: Secondary | ICD-10-CM | POA: Diagnosis not present

## 2018-03-02 DIAGNOSIS — E611 Iron deficiency: Secondary | ICD-10-CM | POA: Diagnosis not present

## 2018-03-02 DIAGNOSIS — D509 Iron deficiency anemia, unspecified: Secondary | ICD-10-CM | POA: Diagnosis not present

## 2018-03-02 DIAGNOSIS — Z992 Dependence on renal dialysis: Secondary | ICD-10-CM | POA: Diagnosis not present

## 2018-03-02 DIAGNOSIS — N186 End stage renal disease: Secondary | ICD-10-CM | POA: Diagnosis not present

## 2018-03-02 DIAGNOSIS — N2581 Secondary hyperparathyroidism of renal origin: Secondary | ICD-10-CM | POA: Diagnosis not present

## 2018-03-02 DIAGNOSIS — Z23 Encounter for immunization: Secondary | ICD-10-CM | POA: Diagnosis not present

## 2018-03-04 DIAGNOSIS — N2581 Secondary hyperparathyroidism of renal origin: Secondary | ICD-10-CM | POA: Diagnosis not present

## 2018-03-04 DIAGNOSIS — Z992 Dependence on renal dialysis: Secondary | ICD-10-CM | POA: Diagnosis not present

## 2018-03-04 DIAGNOSIS — N186 End stage renal disease: Secondary | ICD-10-CM | POA: Diagnosis not present

## 2018-03-04 DIAGNOSIS — Z23 Encounter for immunization: Secondary | ICD-10-CM | POA: Diagnosis not present

## 2018-03-04 DIAGNOSIS — E611 Iron deficiency: Secondary | ICD-10-CM | POA: Diagnosis not present

## 2018-03-04 DIAGNOSIS — D509 Iron deficiency anemia, unspecified: Secondary | ICD-10-CM | POA: Diagnosis not present

## 2018-03-07 DIAGNOSIS — N2581 Secondary hyperparathyroidism of renal origin: Secondary | ICD-10-CM | POA: Diagnosis not present

## 2018-03-07 DIAGNOSIS — D509 Iron deficiency anemia, unspecified: Secondary | ICD-10-CM | POA: Diagnosis not present

## 2018-03-07 DIAGNOSIS — E611 Iron deficiency: Secondary | ICD-10-CM | POA: Diagnosis not present

## 2018-03-07 DIAGNOSIS — Z992 Dependence on renal dialysis: Secondary | ICD-10-CM | POA: Diagnosis not present

## 2018-03-07 DIAGNOSIS — N186 End stage renal disease: Secondary | ICD-10-CM | POA: Diagnosis not present

## 2018-03-07 DIAGNOSIS — Z23 Encounter for immunization: Secondary | ICD-10-CM | POA: Diagnosis not present

## 2018-03-09 DIAGNOSIS — N2581 Secondary hyperparathyroidism of renal origin: Secondary | ICD-10-CM | POA: Diagnosis not present

## 2018-03-09 DIAGNOSIS — N186 End stage renal disease: Secondary | ICD-10-CM | POA: Diagnosis not present

## 2018-03-09 DIAGNOSIS — D509 Iron deficiency anemia, unspecified: Secondary | ICD-10-CM | POA: Diagnosis not present

## 2018-03-09 DIAGNOSIS — E611 Iron deficiency: Secondary | ICD-10-CM | POA: Diagnosis not present

## 2018-03-09 DIAGNOSIS — Z23 Encounter for immunization: Secondary | ICD-10-CM | POA: Diagnosis not present

## 2018-03-09 DIAGNOSIS — Z992 Dependence on renal dialysis: Secondary | ICD-10-CM | POA: Diagnosis not present

## 2018-03-11 DIAGNOSIS — Z992 Dependence on renal dialysis: Secondary | ICD-10-CM | POA: Diagnosis not present

## 2018-03-11 DIAGNOSIS — Z23 Encounter for immunization: Secondary | ICD-10-CM | POA: Diagnosis not present

## 2018-03-11 DIAGNOSIS — D509 Iron deficiency anemia, unspecified: Secondary | ICD-10-CM | POA: Diagnosis not present

## 2018-03-11 DIAGNOSIS — N2581 Secondary hyperparathyroidism of renal origin: Secondary | ICD-10-CM | POA: Diagnosis not present

## 2018-03-11 DIAGNOSIS — N186 End stage renal disease: Secondary | ICD-10-CM | POA: Diagnosis not present

## 2018-03-11 DIAGNOSIS — E611 Iron deficiency: Secondary | ICD-10-CM | POA: Diagnosis not present

## 2018-03-14 DIAGNOSIS — E611 Iron deficiency: Secondary | ICD-10-CM | POA: Diagnosis not present

## 2018-03-14 DIAGNOSIS — D509 Iron deficiency anemia, unspecified: Secondary | ICD-10-CM | POA: Diagnosis not present

## 2018-03-14 DIAGNOSIS — Z23 Encounter for immunization: Secondary | ICD-10-CM | POA: Diagnosis not present

## 2018-03-14 DIAGNOSIS — Z992 Dependence on renal dialysis: Secondary | ICD-10-CM | POA: Diagnosis not present

## 2018-03-14 DIAGNOSIS — N186 End stage renal disease: Secondary | ICD-10-CM | POA: Diagnosis not present

## 2018-03-14 DIAGNOSIS — N2581 Secondary hyperparathyroidism of renal origin: Secondary | ICD-10-CM | POA: Diagnosis not present

## 2018-03-16 DIAGNOSIS — D509 Iron deficiency anemia, unspecified: Secondary | ICD-10-CM | POA: Diagnosis not present

## 2018-03-16 DIAGNOSIS — N186 End stage renal disease: Secondary | ICD-10-CM | POA: Diagnosis not present

## 2018-03-16 DIAGNOSIS — N2581 Secondary hyperparathyroidism of renal origin: Secondary | ICD-10-CM | POA: Diagnosis not present

## 2018-03-16 DIAGNOSIS — Z992 Dependence on renal dialysis: Secondary | ICD-10-CM | POA: Diagnosis not present

## 2018-03-16 DIAGNOSIS — E611 Iron deficiency: Secondary | ICD-10-CM | POA: Diagnosis not present

## 2018-03-16 DIAGNOSIS — Z23 Encounter for immunization: Secondary | ICD-10-CM | POA: Diagnosis not present

## 2018-03-17 DIAGNOSIS — Z23 Encounter for immunization: Secondary | ICD-10-CM | POA: Diagnosis not present

## 2018-03-17 DIAGNOSIS — D509 Iron deficiency anemia, unspecified: Secondary | ICD-10-CM | POA: Diagnosis not present

## 2018-03-17 DIAGNOSIS — N186 End stage renal disease: Secondary | ICD-10-CM | POA: Diagnosis not present

## 2018-03-17 DIAGNOSIS — E611 Iron deficiency: Secondary | ICD-10-CM | POA: Diagnosis not present

## 2018-03-17 DIAGNOSIS — Z992 Dependence on renal dialysis: Secondary | ICD-10-CM | POA: Diagnosis not present

## 2018-03-17 DIAGNOSIS — N2581 Secondary hyperparathyroidism of renal origin: Secondary | ICD-10-CM | POA: Diagnosis not present

## 2018-03-18 DIAGNOSIS — E611 Iron deficiency: Secondary | ICD-10-CM | POA: Diagnosis not present

## 2018-03-18 DIAGNOSIS — Z23 Encounter for immunization: Secondary | ICD-10-CM | POA: Diagnosis not present

## 2018-03-18 DIAGNOSIS — N186 End stage renal disease: Secondary | ICD-10-CM | POA: Diagnosis not present

## 2018-03-18 DIAGNOSIS — Z992 Dependence on renal dialysis: Secondary | ICD-10-CM | POA: Diagnosis not present

## 2018-03-18 DIAGNOSIS — N2581 Secondary hyperparathyroidism of renal origin: Secondary | ICD-10-CM | POA: Diagnosis not present

## 2018-03-18 DIAGNOSIS — D509 Iron deficiency anemia, unspecified: Secondary | ICD-10-CM | POA: Diagnosis not present

## 2018-03-21 DIAGNOSIS — Z23 Encounter for immunization: Secondary | ICD-10-CM | POA: Diagnosis not present

## 2018-03-21 DIAGNOSIS — E611 Iron deficiency: Secondary | ICD-10-CM | POA: Diagnosis not present

## 2018-03-21 DIAGNOSIS — Z992 Dependence on renal dialysis: Secondary | ICD-10-CM | POA: Diagnosis not present

## 2018-03-21 DIAGNOSIS — N2581 Secondary hyperparathyroidism of renal origin: Secondary | ICD-10-CM | POA: Diagnosis not present

## 2018-03-21 DIAGNOSIS — D509 Iron deficiency anemia, unspecified: Secondary | ICD-10-CM | POA: Diagnosis not present

## 2018-03-21 DIAGNOSIS — N186 End stage renal disease: Secondary | ICD-10-CM | POA: Diagnosis not present

## 2018-03-23 DIAGNOSIS — E611 Iron deficiency: Secondary | ICD-10-CM | POA: Diagnosis not present

## 2018-03-23 DIAGNOSIS — D509 Iron deficiency anemia, unspecified: Secondary | ICD-10-CM | POA: Diagnosis not present

## 2018-03-23 DIAGNOSIS — N186 End stage renal disease: Secondary | ICD-10-CM | POA: Diagnosis not present

## 2018-03-23 DIAGNOSIS — N2581 Secondary hyperparathyroidism of renal origin: Secondary | ICD-10-CM | POA: Diagnosis not present

## 2018-03-23 DIAGNOSIS — Z992 Dependence on renal dialysis: Secondary | ICD-10-CM | POA: Diagnosis not present

## 2018-03-23 DIAGNOSIS — Z23 Encounter for immunization: Secondary | ICD-10-CM | POA: Diagnosis not present

## 2018-03-24 DIAGNOSIS — N186 End stage renal disease: Secondary | ICD-10-CM | POA: Diagnosis not present

## 2018-03-24 DIAGNOSIS — D631 Anemia in chronic kidney disease: Secondary | ICD-10-CM | POA: Diagnosis not present

## 2018-03-24 DIAGNOSIS — N2581 Secondary hyperparathyroidism of renal origin: Secondary | ICD-10-CM | POA: Diagnosis not present

## 2018-03-24 DIAGNOSIS — E611 Iron deficiency: Secondary | ICD-10-CM | POA: Diagnosis not present

## 2018-03-24 DIAGNOSIS — Z992 Dependence on renal dialysis: Secondary | ICD-10-CM | POA: Diagnosis not present

## 2018-03-25 DIAGNOSIS — N2581 Secondary hyperparathyroidism of renal origin: Secondary | ICD-10-CM | POA: Diagnosis not present

## 2018-03-25 DIAGNOSIS — Z992 Dependence on renal dialysis: Secondary | ICD-10-CM | POA: Diagnosis not present

## 2018-03-25 DIAGNOSIS — N186 End stage renal disease: Secondary | ICD-10-CM | POA: Diagnosis not present

## 2018-03-25 DIAGNOSIS — D631 Anemia in chronic kidney disease: Secondary | ICD-10-CM | POA: Diagnosis not present

## 2018-03-25 DIAGNOSIS — E611 Iron deficiency: Secondary | ICD-10-CM | POA: Diagnosis not present

## 2018-03-28 DIAGNOSIS — D631 Anemia in chronic kidney disease: Secondary | ICD-10-CM | POA: Diagnosis not present

## 2018-03-28 DIAGNOSIS — N186 End stage renal disease: Secondary | ICD-10-CM | POA: Diagnosis not present

## 2018-03-28 DIAGNOSIS — N2581 Secondary hyperparathyroidism of renal origin: Secondary | ICD-10-CM | POA: Diagnosis not present

## 2018-03-28 DIAGNOSIS — E611 Iron deficiency: Secondary | ICD-10-CM | POA: Diagnosis not present

## 2018-03-28 DIAGNOSIS — Z992 Dependence on renal dialysis: Secondary | ICD-10-CM | POA: Diagnosis not present

## 2018-03-30 DIAGNOSIS — E611 Iron deficiency: Secondary | ICD-10-CM | POA: Diagnosis not present

## 2018-03-30 DIAGNOSIS — Z992 Dependence on renal dialysis: Secondary | ICD-10-CM | POA: Diagnosis not present

## 2018-03-30 DIAGNOSIS — D631 Anemia in chronic kidney disease: Secondary | ICD-10-CM | POA: Diagnosis not present

## 2018-03-30 DIAGNOSIS — N186 End stage renal disease: Secondary | ICD-10-CM | POA: Diagnosis not present

## 2018-03-30 DIAGNOSIS — N2581 Secondary hyperparathyroidism of renal origin: Secondary | ICD-10-CM | POA: Diagnosis not present

## 2018-04-01 DIAGNOSIS — N186 End stage renal disease: Secondary | ICD-10-CM | POA: Diagnosis not present

## 2018-04-01 DIAGNOSIS — N2581 Secondary hyperparathyroidism of renal origin: Secondary | ICD-10-CM | POA: Diagnosis not present

## 2018-04-01 DIAGNOSIS — D631 Anemia in chronic kidney disease: Secondary | ICD-10-CM | POA: Diagnosis not present

## 2018-04-01 DIAGNOSIS — E611 Iron deficiency: Secondary | ICD-10-CM | POA: Diagnosis not present

## 2018-04-01 DIAGNOSIS — Z992 Dependence on renal dialysis: Secondary | ICD-10-CM | POA: Diagnosis not present

## 2018-04-04 DIAGNOSIS — N2581 Secondary hyperparathyroidism of renal origin: Secondary | ICD-10-CM | POA: Diagnosis not present

## 2018-04-04 DIAGNOSIS — Z992 Dependence on renal dialysis: Secondary | ICD-10-CM | POA: Diagnosis not present

## 2018-04-04 DIAGNOSIS — N186 End stage renal disease: Secondary | ICD-10-CM | POA: Diagnosis not present

## 2018-04-04 DIAGNOSIS — D631 Anemia in chronic kidney disease: Secondary | ICD-10-CM | POA: Diagnosis not present

## 2018-04-04 DIAGNOSIS — E611 Iron deficiency: Secondary | ICD-10-CM | POA: Diagnosis not present

## 2018-04-06 DIAGNOSIS — N2581 Secondary hyperparathyroidism of renal origin: Secondary | ICD-10-CM | POA: Diagnosis not present

## 2018-04-06 DIAGNOSIS — Z992 Dependence on renal dialysis: Secondary | ICD-10-CM | POA: Diagnosis not present

## 2018-04-06 DIAGNOSIS — E611 Iron deficiency: Secondary | ICD-10-CM | POA: Diagnosis not present

## 2018-04-06 DIAGNOSIS — D631 Anemia in chronic kidney disease: Secondary | ICD-10-CM | POA: Diagnosis not present

## 2018-04-06 DIAGNOSIS — N186 End stage renal disease: Secondary | ICD-10-CM | POA: Diagnosis not present

## 2018-04-08 DIAGNOSIS — N186 End stage renal disease: Secondary | ICD-10-CM | POA: Diagnosis not present

## 2018-04-08 DIAGNOSIS — D631 Anemia in chronic kidney disease: Secondary | ICD-10-CM | POA: Diagnosis not present

## 2018-04-08 DIAGNOSIS — Z992 Dependence on renal dialysis: Secondary | ICD-10-CM | POA: Diagnosis not present

## 2018-04-08 DIAGNOSIS — N2581 Secondary hyperparathyroidism of renal origin: Secondary | ICD-10-CM | POA: Diagnosis not present

## 2018-04-08 DIAGNOSIS — E611 Iron deficiency: Secondary | ICD-10-CM | POA: Diagnosis not present

## 2018-04-11 DIAGNOSIS — N2581 Secondary hyperparathyroidism of renal origin: Secondary | ICD-10-CM | POA: Diagnosis not present

## 2018-04-11 DIAGNOSIS — D631 Anemia in chronic kidney disease: Secondary | ICD-10-CM | POA: Diagnosis not present

## 2018-04-11 DIAGNOSIS — E611 Iron deficiency: Secondary | ICD-10-CM | POA: Diagnosis not present

## 2018-04-11 DIAGNOSIS — N186 End stage renal disease: Secondary | ICD-10-CM | POA: Diagnosis not present

## 2018-04-11 DIAGNOSIS — Z992 Dependence on renal dialysis: Secondary | ICD-10-CM | POA: Diagnosis not present

## 2018-04-13 DIAGNOSIS — Z992 Dependence on renal dialysis: Secondary | ICD-10-CM | POA: Diagnosis not present

## 2018-04-13 DIAGNOSIS — D631 Anemia in chronic kidney disease: Secondary | ICD-10-CM | POA: Diagnosis not present

## 2018-04-13 DIAGNOSIS — E611 Iron deficiency: Secondary | ICD-10-CM | POA: Diagnosis not present

## 2018-04-13 DIAGNOSIS — N186 End stage renal disease: Secondary | ICD-10-CM | POA: Diagnosis not present

## 2018-04-13 DIAGNOSIS — N2581 Secondary hyperparathyroidism of renal origin: Secondary | ICD-10-CM | POA: Diagnosis not present

## 2018-04-15 DIAGNOSIS — Z992 Dependence on renal dialysis: Secondary | ICD-10-CM | POA: Diagnosis not present

## 2018-04-15 DIAGNOSIS — N2581 Secondary hyperparathyroidism of renal origin: Secondary | ICD-10-CM | POA: Diagnosis not present

## 2018-04-15 DIAGNOSIS — N186 End stage renal disease: Secondary | ICD-10-CM | POA: Diagnosis not present

## 2018-04-15 DIAGNOSIS — E611 Iron deficiency: Secondary | ICD-10-CM | POA: Diagnosis not present

## 2018-04-15 DIAGNOSIS — D631 Anemia in chronic kidney disease: Secondary | ICD-10-CM | POA: Diagnosis not present

## 2018-04-16 DIAGNOSIS — D631 Anemia in chronic kidney disease: Secondary | ICD-10-CM | POA: Diagnosis not present

## 2018-04-16 DIAGNOSIS — Z992 Dependence on renal dialysis: Secondary | ICD-10-CM | POA: Diagnosis not present

## 2018-04-16 DIAGNOSIS — N2581 Secondary hyperparathyroidism of renal origin: Secondary | ICD-10-CM | POA: Diagnosis not present

## 2018-04-16 DIAGNOSIS — N186 End stage renal disease: Secondary | ICD-10-CM | POA: Diagnosis not present

## 2018-04-16 DIAGNOSIS — E611 Iron deficiency: Secondary | ICD-10-CM | POA: Diagnosis not present

## 2018-04-18 DIAGNOSIS — E611 Iron deficiency: Secondary | ICD-10-CM | POA: Diagnosis not present

## 2018-04-18 DIAGNOSIS — Z992 Dependence on renal dialysis: Secondary | ICD-10-CM | POA: Diagnosis not present

## 2018-04-18 DIAGNOSIS — N2581 Secondary hyperparathyroidism of renal origin: Secondary | ICD-10-CM | POA: Diagnosis not present

## 2018-04-18 DIAGNOSIS — D631 Anemia in chronic kidney disease: Secondary | ICD-10-CM | POA: Diagnosis not present

## 2018-04-18 DIAGNOSIS — N186 End stage renal disease: Secondary | ICD-10-CM | POA: Diagnosis not present

## 2018-04-20 DIAGNOSIS — Z992 Dependence on renal dialysis: Secondary | ICD-10-CM | POA: Diagnosis not present

## 2018-04-20 DIAGNOSIS — E611 Iron deficiency: Secondary | ICD-10-CM | POA: Diagnosis not present

## 2018-04-20 DIAGNOSIS — N2581 Secondary hyperparathyroidism of renal origin: Secondary | ICD-10-CM | POA: Diagnosis not present

## 2018-04-20 DIAGNOSIS — N186 End stage renal disease: Secondary | ICD-10-CM | POA: Diagnosis not present

## 2018-04-20 DIAGNOSIS — D631 Anemia in chronic kidney disease: Secondary | ICD-10-CM | POA: Diagnosis not present

## 2018-04-22 DIAGNOSIS — N186 End stage renal disease: Secondary | ICD-10-CM | POA: Diagnosis not present

## 2018-04-22 DIAGNOSIS — E611 Iron deficiency: Secondary | ICD-10-CM | POA: Diagnosis not present

## 2018-04-22 DIAGNOSIS — N2581 Secondary hyperparathyroidism of renal origin: Secondary | ICD-10-CM | POA: Diagnosis not present

## 2018-04-22 DIAGNOSIS — Z992 Dependence on renal dialysis: Secondary | ICD-10-CM | POA: Diagnosis not present

## 2018-04-22 DIAGNOSIS — D631 Anemia in chronic kidney disease: Secondary | ICD-10-CM | POA: Diagnosis not present

## 2018-04-23 DIAGNOSIS — N2581 Secondary hyperparathyroidism of renal origin: Secondary | ICD-10-CM | POA: Diagnosis not present

## 2018-04-23 DIAGNOSIS — Z992 Dependence on renal dialysis: Secondary | ICD-10-CM | POA: Diagnosis not present

## 2018-04-23 DIAGNOSIS — D631 Anemia in chronic kidney disease: Secondary | ICD-10-CM | POA: Diagnosis not present

## 2018-04-23 DIAGNOSIS — N186 End stage renal disease: Secondary | ICD-10-CM | POA: Diagnosis not present

## 2018-04-23 DIAGNOSIS — E611 Iron deficiency: Secondary | ICD-10-CM | POA: Diagnosis not present

## 2018-04-25 DIAGNOSIS — Z992 Dependence on renal dialysis: Secondary | ICD-10-CM | POA: Diagnosis not present

## 2018-04-25 DIAGNOSIS — D631 Anemia in chronic kidney disease: Secondary | ICD-10-CM | POA: Diagnosis not present

## 2018-04-25 DIAGNOSIS — E611 Iron deficiency: Secondary | ICD-10-CM | POA: Diagnosis not present

## 2018-04-25 DIAGNOSIS — N186 End stage renal disease: Secondary | ICD-10-CM | POA: Diagnosis not present

## 2018-04-25 DIAGNOSIS — N2581 Secondary hyperparathyroidism of renal origin: Secondary | ICD-10-CM | POA: Diagnosis not present

## 2018-04-27 DIAGNOSIS — E611 Iron deficiency: Secondary | ICD-10-CM | POA: Diagnosis not present

## 2018-04-27 DIAGNOSIS — Z992 Dependence on renal dialysis: Secondary | ICD-10-CM | POA: Diagnosis not present

## 2018-04-27 DIAGNOSIS — N2581 Secondary hyperparathyroidism of renal origin: Secondary | ICD-10-CM | POA: Diagnosis not present

## 2018-04-27 DIAGNOSIS — N186 End stage renal disease: Secondary | ICD-10-CM | POA: Diagnosis not present

## 2018-04-27 DIAGNOSIS — D631 Anemia in chronic kidney disease: Secondary | ICD-10-CM | POA: Diagnosis not present

## 2018-04-29 DIAGNOSIS — N2581 Secondary hyperparathyroidism of renal origin: Secondary | ICD-10-CM | POA: Diagnosis not present

## 2018-04-29 DIAGNOSIS — N186 End stage renal disease: Secondary | ICD-10-CM | POA: Diagnosis not present

## 2018-04-29 DIAGNOSIS — Z992 Dependence on renal dialysis: Secondary | ICD-10-CM | POA: Diagnosis not present

## 2018-04-29 DIAGNOSIS — D631 Anemia in chronic kidney disease: Secondary | ICD-10-CM | POA: Diagnosis not present

## 2018-04-29 DIAGNOSIS — E611 Iron deficiency: Secondary | ICD-10-CM | POA: Diagnosis not present

## 2018-05-02 DIAGNOSIS — D631 Anemia in chronic kidney disease: Secondary | ICD-10-CM | POA: Diagnosis not present

## 2018-05-02 DIAGNOSIS — N2581 Secondary hyperparathyroidism of renal origin: Secondary | ICD-10-CM | POA: Diagnosis not present

## 2018-05-02 DIAGNOSIS — Z992 Dependence on renal dialysis: Secondary | ICD-10-CM | POA: Diagnosis not present

## 2018-05-02 DIAGNOSIS — N186 End stage renal disease: Secondary | ICD-10-CM | POA: Diagnosis not present

## 2018-05-02 DIAGNOSIS — E611 Iron deficiency: Secondary | ICD-10-CM | POA: Diagnosis not present

## 2018-05-04 DIAGNOSIS — N186 End stage renal disease: Secondary | ICD-10-CM | POA: Diagnosis not present

## 2018-05-04 DIAGNOSIS — E611 Iron deficiency: Secondary | ICD-10-CM | POA: Diagnosis not present

## 2018-05-04 DIAGNOSIS — N2581 Secondary hyperparathyroidism of renal origin: Secondary | ICD-10-CM | POA: Diagnosis not present

## 2018-05-04 DIAGNOSIS — D631 Anemia in chronic kidney disease: Secondary | ICD-10-CM | POA: Diagnosis not present

## 2018-05-04 DIAGNOSIS — Z992 Dependence on renal dialysis: Secondary | ICD-10-CM | POA: Diagnosis not present

## 2018-05-05 DIAGNOSIS — M545 Low back pain: Secondary | ICD-10-CM | POA: Diagnosis not present

## 2018-05-05 DIAGNOSIS — I10 Essential (primary) hypertension: Secondary | ICD-10-CM | POA: Diagnosis not present

## 2018-05-05 DIAGNOSIS — F17200 Nicotine dependence, unspecified, uncomplicated: Secondary | ICD-10-CM | POA: Diagnosis not present

## 2018-05-05 DIAGNOSIS — N186 End stage renal disease: Secondary | ICD-10-CM | POA: Diagnosis not present

## 2018-05-06 DIAGNOSIS — D631 Anemia in chronic kidney disease: Secondary | ICD-10-CM | POA: Diagnosis not present

## 2018-05-06 DIAGNOSIS — N186 End stage renal disease: Secondary | ICD-10-CM | POA: Diagnosis not present

## 2018-05-06 DIAGNOSIS — E611 Iron deficiency: Secondary | ICD-10-CM | POA: Diagnosis not present

## 2018-05-06 DIAGNOSIS — Z992 Dependence on renal dialysis: Secondary | ICD-10-CM | POA: Diagnosis not present

## 2018-05-06 DIAGNOSIS — N2581 Secondary hyperparathyroidism of renal origin: Secondary | ICD-10-CM | POA: Diagnosis not present

## 2018-05-09 DIAGNOSIS — D631 Anemia in chronic kidney disease: Secondary | ICD-10-CM | POA: Diagnosis not present

## 2018-05-09 DIAGNOSIS — Z992 Dependence on renal dialysis: Secondary | ICD-10-CM | POA: Diagnosis not present

## 2018-05-09 DIAGNOSIS — N186 End stage renal disease: Secondary | ICD-10-CM | POA: Diagnosis not present

## 2018-05-09 DIAGNOSIS — N2581 Secondary hyperparathyroidism of renal origin: Secondary | ICD-10-CM | POA: Diagnosis not present

## 2018-05-09 DIAGNOSIS — E611 Iron deficiency: Secondary | ICD-10-CM | POA: Diagnosis not present

## 2018-05-11 DIAGNOSIS — D631 Anemia in chronic kidney disease: Secondary | ICD-10-CM | POA: Diagnosis not present

## 2018-05-11 DIAGNOSIS — N2581 Secondary hyperparathyroidism of renal origin: Secondary | ICD-10-CM | POA: Diagnosis not present

## 2018-05-11 DIAGNOSIS — N186 End stage renal disease: Secondary | ICD-10-CM | POA: Diagnosis not present

## 2018-05-11 DIAGNOSIS — E611 Iron deficiency: Secondary | ICD-10-CM | POA: Diagnosis not present

## 2018-05-11 DIAGNOSIS — Z992 Dependence on renal dialysis: Secondary | ICD-10-CM | POA: Diagnosis not present

## 2018-05-13 DIAGNOSIS — E611 Iron deficiency: Secondary | ICD-10-CM | POA: Diagnosis not present

## 2018-05-13 DIAGNOSIS — N186 End stage renal disease: Secondary | ICD-10-CM | POA: Diagnosis not present

## 2018-05-13 DIAGNOSIS — Z992 Dependence on renal dialysis: Secondary | ICD-10-CM | POA: Diagnosis not present

## 2018-05-13 DIAGNOSIS — D631 Anemia in chronic kidney disease: Secondary | ICD-10-CM | POA: Diagnosis not present

## 2018-05-13 DIAGNOSIS — N2581 Secondary hyperparathyroidism of renal origin: Secondary | ICD-10-CM | POA: Diagnosis not present

## 2018-05-15 DIAGNOSIS — Z992 Dependence on renal dialysis: Secondary | ICD-10-CM | POA: Diagnosis not present

## 2018-05-15 DIAGNOSIS — N2581 Secondary hyperparathyroidism of renal origin: Secondary | ICD-10-CM | POA: Diagnosis not present

## 2018-05-15 DIAGNOSIS — D631 Anemia in chronic kidney disease: Secondary | ICD-10-CM | POA: Diagnosis not present

## 2018-05-15 DIAGNOSIS — E611 Iron deficiency: Secondary | ICD-10-CM | POA: Diagnosis not present

## 2018-05-15 DIAGNOSIS — N186 End stage renal disease: Secondary | ICD-10-CM | POA: Diagnosis not present

## 2018-05-16 DIAGNOSIS — Z992 Dependence on renal dialysis: Secondary | ICD-10-CM | POA: Diagnosis not present

## 2018-05-16 DIAGNOSIS — N186 End stage renal disease: Secondary | ICD-10-CM | POA: Diagnosis not present

## 2018-05-16 DIAGNOSIS — D631 Anemia in chronic kidney disease: Secondary | ICD-10-CM | POA: Diagnosis not present

## 2018-05-16 DIAGNOSIS — E611 Iron deficiency: Secondary | ICD-10-CM | POA: Diagnosis not present

## 2018-05-16 DIAGNOSIS — N2581 Secondary hyperparathyroidism of renal origin: Secondary | ICD-10-CM | POA: Diagnosis not present

## 2018-05-18 DIAGNOSIS — D631 Anemia in chronic kidney disease: Secondary | ICD-10-CM | POA: Diagnosis not present

## 2018-05-18 DIAGNOSIS — N2581 Secondary hyperparathyroidism of renal origin: Secondary | ICD-10-CM | POA: Diagnosis not present

## 2018-05-18 DIAGNOSIS — Z992 Dependence on renal dialysis: Secondary | ICD-10-CM | POA: Diagnosis not present

## 2018-05-18 DIAGNOSIS — E611 Iron deficiency: Secondary | ICD-10-CM | POA: Diagnosis not present

## 2018-05-18 DIAGNOSIS — N186 End stage renal disease: Secondary | ICD-10-CM | POA: Diagnosis not present

## 2018-05-20 DIAGNOSIS — Z992 Dependence on renal dialysis: Secondary | ICD-10-CM | POA: Diagnosis not present

## 2018-05-20 DIAGNOSIS — N2581 Secondary hyperparathyroidism of renal origin: Secondary | ICD-10-CM | POA: Diagnosis not present

## 2018-05-20 DIAGNOSIS — N186 End stage renal disease: Secondary | ICD-10-CM | POA: Diagnosis not present

## 2018-05-20 DIAGNOSIS — E611 Iron deficiency: Secondary | ICD-10-CM | POA: Diagnosis not present

## 2018-05-20 DIAGNOSIS — D631 Anemia in chronic kidney disease: Secondary | ICD-10-CM | POA: Diagnosis not present

## 2018-05-23 DIAGNOSIS — Z992 Dependence on renal dialysis: Secondary | ICD-10-CM | POA: Diagnosis not present

## 2018-05-23 DIAGNOSIS — N2581 Secondary hyperparathyroidism of renal origin: Secondary | ICD-10-CM | POA: Diagnosis not present

## 2018-05-23 DIAGNOSIS — E611 Iron deficiency: Secondary | ICD-10-CM | POA: Diagnosis not present

## 2018-05-23 DIAGNOSIS — N186 End stage renal disease: Secondary | ICD-10-CM | POA: Diagnosis not present

## 2018-05-23 DIAGNOSIS — D631 Anemia in chronic kidney disease: Secondary | ICD-10-CM | POA: Diagnosis not present

## 2018-05-24 DIAGNOSIS — D509 Iron deficiency anemia, unspecified: Secondary | ICD-10-CM | POA: Diagnosis not present

## 2018-05-24 DIAGNOSIS — N2581 Secondary hyperparathyroidism of renal origin: Secondary | ICD-10-CM | POA: Diagnosis not present

## 2018-05-24 DIAGNOSIS — D631 Anemia in chronic kidney disease: Secondary | ICD-10-CM | POA: Diagnosis not present

## 2018-05-24 DIAGNOSIS — Z992 Dependence on renal dialysis: Secondary | ICD-10-CM | POA: Diagnosis not present

## 2018-05-24 DIAGNOSIS — N186 End stage renal disease: Secondary | ICD-10-CM | POA: Diagnosis not present

## 2018-05-24 DIAGNOSIS — E611 Iron deficiency: Secondary | ICD-10-CM | POA: Diagnosis not present

## 2018-05-25 DIAGNOSIS — Z992 Dependence on renal dialysis: Secondary | ICD-10-CM | POA: Diagnosis not present

## 2018-05-25 DIAGNOSIS — N2581 Secondary hyperparathyroidism of renal origin: Secondary | ICD-10-CM | POA: Diagnosis not present

## 2018-05-25 DIAGNOSIS — E611 Iron deficiency: Secondary | ICD-10-CM | POA: Diagnosis not present

## 2018-05-25 DIAGNOSIS — D631 Anemia in chronic kidney disease: Secondary | ICD-10-CM | POA: Diagnosis not present

## 2018-05-25 DIAGNOSIS — D509 Iron deficiency anemia, unspecified: Secondary | ICD-10-CM | POA: Diagnosis not present

## 2018-05-25 DIAGNOSIS — N186 End stage renal disease: Secondary | ICD-10-CM | POA: Diagnosis not present

## 2018-05-27 DIAGNOSIS — E611 Iron deficiency: Secondary | ICD-10-CM | POA: Diagnosis not present

## 2018-05-27 DIAGNOSIS — N2581 Secondary hyperparathyroidism of renal origin: Secondary | ICD-10-CM | POA: Diagnosis not present

## 2018-05-27 DIAGNOSIS — N186 End stage renal disease: Secondary | ICD-10-CM | POA: Diagnosis not present

## 2018-05-27 DIAGNOSIS — D631 Anemia in chronic kidney disease: Secondary | ICD-10-CM | POA: Diagnosis not present

## 2018-05-27 DIAGNOSIS — Z992 Dependence on renal dialysis: Secondary | ICD-10-CM | POA: Diagnosis not present

## 2018-05-27 DIAGNOSIS — D509 Iron deficiency anemia, unspecified: Secondary | ICD-10-CM | POA: Diagnosis not present

## 2018-05-30 DIAGNOSIS — E611 Iron deficiency: Secondary | ICD-10-CM | POA: Diagnosis not present

## 2018-05-30 DIAGNOSIS — D509 Iron deficiency anemia, unspecified: Secondary | ICD-10-CM | POA: Diagnosis not present

## 2018-05-30 DIAGNOSIS — N2581 Secondary hyperparathyroidism of renal origin: Secondary | ICD-10-CM | POA: Diagnosis not present

## 2018-05-30 DIAGNOSIS — N186 End stage renal disease: Secondary | ICD-10-CM | POA: Diagnosis not present

## 2018-05-30 DIAGNOSIS — Z992 Dependence on renal dialysis: Secondary | ICD-10-CM | POA: Diagnosis not present

## 2018-05-30 DIAGNOSIS — D631 Anemia in chronic kidney disease: Secondary | ICD-10-CM | POA: Diagnosis not present

## 2018-06-01 DIAGNOSIS — N2581 Secondary hyperparathyroidism of renal origin: Secondary | ICD-10-CM | POA: Diagnosis not present

## 2018-06-01 DIAGNOSIS — Z992 Dependence on renal dialysis: Secondary | ICD-10-CM | POA: Diagnosis not present

## 2018-06-01 DIAGNOSIS — E611 Iron deficiency: Secondary | ICD-10-CM | POA: Diagnosis not present

## 2018-06-01 DIAGNOSIS — D509 Iron deficiency anemia, unspecified: Secondary | ICD-10-CM | POA: Diagnosis not present

## 2018-06-01 DIAGNOSIS — N186 End stage renal disease: Secondary | ICD-10-CM | POA: Diagnosis not present

## 2018-06-01 DIAGNOSIS — D631 Anemia in chronic kidney disease: Secondary | ICD-10-CM | POA: Diagnosis not present

## 2018-06-03 DIAGNOSIS — D509 Iron deficiency anemia, unspecified: Secondary | ICD-10-CM | POA: Diagnosis not present

## 2018-06-03 DIAGNOSIS — N186 End stage renal disease: Secondary | ICD-10-CM | POA: Diagnosis not present

## 2018-06-03 DIAGNOSIS — E611 Iron deficiency: Secondary | ICD-10-CM | POA: Diagnosis not present

## 2018-06-03 DIAGNOSIS — Z992 Dependence on renal dialysis: Secondary | ICD-10-CM | POA: Diagnosis not present

## 2018-06-03 DIAGNOSIS — N2581 Secondary hyperparathyroidism of renal origin: Secondary | ICD-10-CM | POA: Diagnosis not present

## 2018-06-03 DIAGNOSIS — D631 Anemia in chronic kidney disease: Secondary | ICD-10-CM | POA: Diagnosis not present

## 2018-06-06 DIAGNOSIS — D631 Anemia in chronic kidney disease: Secondary | ICD-10-CM | POA: Diagnosis not present

## 2018-06-06 DIAGNOSIS — N186 End stage renal disease: Secondary | ICD-10-CM | POA: Diagnosis not present

## 2018-06-06 DIAGNOSIS — E611 Iron deficiency: Secondary | ICD-10-CM | POA: Diagnosis not present

## 2018-06-06 DIAGNOSIS — N2581 Secondary hyperparathyroidism of renal origin: Secondary | ICD-10-CM | POA: Diagnosis not present

## 2018-06-06 DIAGNOSIS — D509 Iron deficiency anemia, unspecified: Secondary | ICD-10-CM | POA: Diagnosis not present

## 2018-06-06 DIAGNOSIS — Z992 Dependence on renal dialysis: Secondary | ICD-10-CM | POA: Diagnosis not present

## 2018-06-08 DIAGNOSIS — D509 Iron deficiency anemia, unspecified: Secondary | ICD-10-CM | POA: Diagnosis not present

## 2018-06-08 DIAGNOSIS — D631 Anemia in chronic kidney disease: Secondary | ICD-10-CM | POA: Diagnosis not present

## 2018-06-08 DIAGNOSIS — N2581 Secondary hyperparathyroidism of renal origin: Secondary | ICD-10-CM | POA: Diagnosis not present

## 2018-06-08 DIAGNOSIS — N186 End stage renal disease: Secondary | ICD-10-CM | POA: Diagnosis not present

## 2018-06-08 DIAGNOSIS — E611 Iron deficiency: Secondary | ICD-10-CM | POA: Diagnosis not present

## 2018-06-08 DIAGNOSIS — Z992 Dependence on renal dialysis: Secondary | ICD-10-CM | POA: Diagnosis not present

## 2018-06-10 DIAGNOSIS — D509 Iron deficiency anemia, unspecified: Secondary | ICD-10-CM | POA: Diagnosis not present

## 2018-06-10 DIAGNOSIS — N2581 Secondary hyperparathyroidism of renal origin: Secondary | ICD-10-CM | POA: Diagnosis not present

## 2018-06-10 DIAGNOSIS — Z992 Dependence on renal dialysis: Secondary | ICD-10-CM | POA: Diagnosis not present

## 2018-06-10 DIAGNOSIS — E611 Iron deficiency: Secondary | ICD-10-CM | POA: Diagnosis not present

## 2018-06-10 DIAGNOSIS — D631 Anemia in chronic kidney disease: Secondary | ICD-10-CM | POA: Diagnosis not present

## 2018-06-10 DIAGNOSIS — N186 End stage renal disease: Secondary | ICD-10-CM | POA: Diagnosis not present

## 2018-06-13 DIAGNOSIS — D631 Anemia in chronic kidney disease: Secondary | ICD-10-CM | POA: Diagnosis not present

## 2018-06-13 DIAGNOSIS — N2581 Secondary hyperparathyroidism of renal origin: Secondary | ICD-10-CM | POA: Diagnosis not present

## 2018-06-13 DIAGNOSIS — D509 Iron deficiency anemia, unspecified: Secondary | ICD-10-CM | POA: Diagnosis not present

## 2018-06-13 DIAGNOSIS — E611 Iron deficiency: Secondary | ICD-10-CM | POA: Diagnosis not present

## 2018-06-13 DIAGNOSIS — Z992 Dependence on renal dialysis: Secondary | ICD-10-CM | POA: Diagnosis not present

## 2018-06-13 DIAGNOSIS — N186 End stage renal disease: Secondary | ICD-10-CM | POA: Diagnosis not present

## 2018-06-15 DIAGNOSIS — N186 End stage renal disease: Secondary | ICD-10-CM | POA: Diagnosis not present

## 2018-06-15 DIAGNOSIS — D631 Anemia in chronic kidney disease: Secondary | ICD-10-CM | POA: Diagnosis not present

## 2018-06-15 DIAGNOSIS — D509 Iron deficiency anemia, unspecified: Secondary | ICD-10-CM | POA: Diagnosis not present

## 2018-06-15 DIAGNOSIS — E611 Iron deficiency: Secondary | ICD-10-CM | POA: Diagnosis not present

## 2018-06-15 DIAGNOSIS — N2581 Secondary hyperparathyroidism of renal origin: Secondary | ICD-10-CM | POA: Diagnosis not present

## 2018-06-15 DIAGNOSIS — Z992 Dependence on renal dialysis: Secondary | ICD-10-CM | POA: Diagnosis not present

## 2018-06-17 DIAGNOSIS — N2581 Secondary hyperparathyroidism of renal origin: Secondary | ICD-10-CM | POA: Diagnosis not present

## 2018-06-17 DIAGNOSIS — E611 Iron deficiency: Secondary | ICD-10-CM | POA: Diagnosis not present

## 2018-06-17 DIAGNOSIS — D631 Anemia in chronic kidney disease: Secondary | ICD-10-CM | POA: Diagnosis not present

## 2018-06-17 DIAGNOSIS — D509 Iron deficiency anemia, unspecified: Secondary | ICD-10-CM | POA: Diagnosis not present

## 2018-06-17 DIAGNOSIS — Z992 Dependence on renal dialysis: Secondary | ICD-10-CM | POA: Diagnosis not present

## 2018-06-17 DIAGNOSIS — N186 End stage renal disease: Secondary | ICD-10-CM | POA: Diagnosis not present

## 2018-06-20 DIAGNOSIS — D631 Anemia in chronic kidney disease: Secondary | ICD-10-CM | POA: Diagnosis not present

## 2018-06-20 DIAGNOSIS — N186 End stage renal disease: Secondary | ICD-10-CM | POA: Diagnosis not present

## 2018-06-20 DIAGNOSIS — E611 Iron deficiency: Secondary | ICD-10-CM | POA: Diagnosis not present

## 2018-06-20 DIAGNOSIS — N2581 Secondary hyperparathyroidism of renal origin: Secondary | ICD-10-CM | POA: Diagnosis not present

## 2018-06-20 DIAGNOSIS — D509 Iron deficiency anemia, unspecified: Secondary | ICD-10-CM | POA: Diagnosis not present

## 2018-06-20 DIAGNOSIS — Z992 Dependence on renal dialysis: Secondary | ICD-10-CM | POA: Diagnosis not present

## 2018-06-22 DIAGNOSIS — Z992 Dependence on renal dialysis: Secondary | ICD-10-CM | POA: Diagnosis not present

## 2018-06-22 DIAGNOSIS — D509 Iron deficiency anemia, unspecified: Secondary | ICD-10-CM | POA: Diagnosis not present

## 2018-06-22 DIAGNOSIS — D631 Anemia in chronic kidney disease: Secondary | ICD-10-CM | POA: Diagnosis not present

## 2018-06-22 DIAGNOSIS — N186 End stage renal disease: Secondary | ICD-10-CM | POA: Diagnosis not present

## 2018-06-22 DIAGNOSIS — E611 Iron deficiency: Secondary | ICD-10-CM | POA: Diagnosis not present

## 2018-06-22 DIAGNOSIS — N2581 Secondary hyperparathyroidism of renal origin: Secondary | ICD-10-CM | POA: Diagnosis not present

## 2018-06-24 DIAGNOSIS — D631 Anemia in chronic kidney disease: Secondary | ICD-10-CM | POA: Diagnosis not present

## 2018-06-24 DIAGNOSIS — N2581 Secondary hyperparathyroidism of renal origin: Secondary | ICD-10-CM | POA: Diagnosis not present

## 2018-06-24 DIAGNOSIS — N186 End stage renal disease: Secondary | ICD-10-CM | POA: Diagnosis not present

## 2018-06-24 DIAGNOSIS — Z992 Dependence on renal dialysis: Secondary | ICD-10-CM | POA: Diagnosis not present

## 2018-06-24 DIAGNOSIS — E611 Iron deficiency: Secondary | ICD-10-CM | POA: Diagnosis not present

## 2018-06-27 DIAGNOSIS — D631 Anemia in chronic kidney disease: Secondary | ICD-10-CM | POA: Diagnosis not present

## 2018-06-27 DIAGNOSIS — N186 End stage renal disease: Secondary | ICD-10-CM | POA: Diagnosis not present

## 2018-06-27 DIAGNOSIS — Z992 Dependence on renal dialysis: Secondary | ICD-10-CM | POA: Diagnosis not present

## 2018-06-27 DIAGNOSIS — N2581 Secondary hyperparathyroidism of renal origin: Secondary | ICD-10-CM | POA: Diagnosis not present

## 2018-06-27 DIAGNOSIS — E611 Iron deficiency: Secondary | ICD-10-CM | POA: Diagnosis not present

## 2018-06-29 DIAGNOSIS — N2581 Secondary hyperparathyroidism of renal origin: Secondary | ICD-10-CM | POA: Diagnosis not present

## 2018-06-29 DIAGNOSIS — N186 End stage renal disease: Secondary | ICD-10-CM | POA: Diagnosis not present

## 2018-06-29 DIAGNOSIS — E611 Iron deficiency: Secondary | ICD-10-CM | POA: Diagnosis not present

## 2018-06-29 DIAGNOSIS — Z992 Dependence on renal dialysis: Secondary | ICD-10-CM | POA: Diagnosis not present

## 2018-06-29 DIAGNOSIS — D631 Anemia in chronic kidney disease: Secondary | ICD-10-CM | POA: Diagnosis not present

## 2018-07-01 DIAGNOSIS — D631 Anemia in chronic kidney disease: Secondary | ICD-10-CM | POA: Diagnosis not present

## 2018-07-01 DIAGNOSIS — N186 End stage renal disease: Secondary | ICD-10-CM | POA: Diagnosis not present

## 2018-07-01 DIAGNOSIS — N2581 Secondary hyperparathyroidism of renal origin: Secondary | ICD-10-CM | POA: Diagnosis not present

## 2018-07-01 DIAGNOSIS — Z992 Dependence on renal dialysis: Secondary | ICD-10-CM | POA: Diagnosis not present

## 2018-07-01 DIAGNOSIS — E611 Iron deficiency: Secondary | ICD-10-CM | POA: Diagnosis not present

## 2018-07-04 DIAGNOSIS — Z992 Dependence on renal dialysis: Secondary | ICD-10-CM | POA: Diagnosis not present

## 2018-07-04 DIAGNOSIS — N2581 Secondary hyperparathyroidism of renal origin: Secondary | ICD-10-CM | POA: Diagnosis not present

## 2018-07-04 DIAGNOSIS — E611 Iron deficiency: Secondary | ICD-10-CM | POA: Diagnosis not present

## 2018-07-04 DIAGNOSIS — N186 End stage renal disease: Secondary | ICD-10-CM | POA: Diagnosis not present

## 2018-07-04 DIAGNOSIS — D631 Anemia in chronic kidney disease: Secondary | ICD-10-CM | POA: Diagnosis not present

## 2018-07-06 DIAGNOSIS — E611 Iron deficiency: Secondary | ICD-10-CM | POA: Diagnosis not present

## 2018-07-06 DIAGNOSIS — D631 Anemia in chronic kidney disease: Secondary | ICD-10-CM | POA: Diagnosis not present

## 2018-07-06 DIAGNOSIS — N2581 Secondary hyperparathyroidism of renal origin: Secondary | ICD-10-CM | POA: Diagnosis not present

## 2018-07-06 DIAGNOSIS — Z992 Dependence on renal dialysis: Secondary | ICD-10-CM | POA: Diagnosis not present

## 2018-07-06 DIAGNOSIS — N186 End stage renal disease: Secondary | ICD-10-CM | POA: Diagnosis not present

## 2018-07-08 DIAGNOSIS — N2581 Secondary hyperparathyroidism of renal origin: Secondary | ICD-10-CM | POA: Diagnosis not present

## 2018-07-08 DIAGNOSIS — N186 End stage renal disease: Secondary | ICD-10-CM | POA: Diagnosis not present

## 2018-07-08 DIAGNOSIS — D631 Anemia in chronic kidney disease: Secondary | ICD-10-CM | POA: Diagnosis not present

## 2018-07-08 DIAGNOSIS — Z992 Dependence on renal dialysis: Secondary | ICD-10-CM | POA: Diagnosis not present

## 2018-07-08 DIAGNOSIS — E611 Iron deficiency: Secondary | ICD-10-CM | POA: Diagnosis not present

## 2018-07-11 DIAGNOSIS — E611 Iron deficiency: Secondary | ICD-10-CM | POA: Diagnosis not present

## 2018-07-11 DIAGNOSIS — Z992 Dependence on renal dialysis: Secondary | ICD-10-CM | POA: Diagnosis not present

## 2018-07-11 DIAGNOSIS — N2581 Secondary hyperparathyroidism of renal origin: Secondary | ICD-10-CM | POA: Diagnosis not present

## 2018-07-11 DIAGNOSIS — N186 End stage renal disease: Secondary | ICD-10-CM | POA: Diagnosis not present

## 2018-07-11 DIAGNOSIS — D631 Anemia in chronic kidney disease: Secondary | ICD-10-CM | POA: Diagnosis not present

## 2018-07-13 DIAGNOSIS — E611 Iron deficiency: Secondary | ICD-10-CM | POA: Diagnosis not present

## 2018-07-13 DIAGNOSIS — N186 End stage renal disease: Secondary | ICD-10-CM | POA: Diagnosis not present

## 2018-07-13 DIAGNOSIS — D631 Anemia in chronic kidney disease: Secondary | ICD-10-CM | POA: Diagnosis not present

## 2018-07-13 DIAGNOSIS — N2581 Secondary hyperparathyroidism of renal origin: Secondary | ICD-10-CM | POA: Diagnosis not present

## 2018-07-13 DIAGNOSIS — Z992 Dependence on renal dialysis: Secondary | ICD-10-CM | POA: Diagnosis not present

## 2018-07-15 DIAGNOSIS — D631 Anemia in chronic kidney disease: Secondary | ICD-10-CM | POA: Diagnosis not present

## 2018-07-15 DIAGNOSIS — E611 Iron deficiency: Secondary | ICD-10-CM | POA: Diagnosis not present

## 2018-07-15 DIAGNOSIS — Z992 Dependence on renal dialysis: Secondary | ICD-10-CM | POA: Diagnosis not present

## 2018-07-15 DIAGNOSIS — N2581 Secondary hyperparathyroidism of renal origin: Secondary | ICD-10-CM | POA: Diagnosis not present

## 2018-07-15 DIAGNOSIS — N186 End stage renal disease: Secondary | ICD-10-CM | POA: Diagnosis not present

## 2018-07-18 DIAGNOSIS — N186 End stage renal disease: Secondary | ICD-10-CM | POA: Diagnosis not present

## 2018-07-18 DIAGNOSIS — D631 Anemia in chronic kidney disease: Secondary | ICD-10-CM | POA: Diagnosis not present

## 2018-07-18 DIAGNOSIS — E611 Iron deficiency: Secondary | ICD-10-CM | POA: Diagnosis not present

## 2018-07-18 DIAGNOSIS — Z992 Dependence on renal dialysis: Secondary | ICD-10-CM | POA: Diagnosis not present

## 2018-07-18 DIAGNOSIS — N2581 Secondary hyperparathyroidism of renal origin: Secondary | ICD-10-CM | POA: Diagnosis not present

## 2018-07-20 DIAGNOSIS — N186 End stage renal disease: Secondary | ICD-10-CM | POA: Diagnosis not present

## 2018-07-20 DIAGNOSIS — E611 Iron deficiency: Secondary | ICD-10-CM | POA: Diagnosis not present

## 2018-07-20 DIAGNOSIS — Z992 Dependence on renal dialysis: Secondary | ICD-10-CM | POA: Diagnosis not present

## 2018-07-20 DIAGNOSIS — N2581 Secondary hyperparathyroidism of renal origin: Secondary | ICD-10-CM | POA: Diagnosis not present

## 2018-07-20 DIAGNOSIS — D631 Anemia in chronic kidney disease: Secondary | ICD-10-CM | POA: Diagnosis not present

## 2018-07-22 DIAGNOSIS — N2581 Secondary hyperparathyroidism of renal origin: Secondary | ICD-10-CM | POA: Diagnosis not present

## 2018-07-22 DIAGNOSIS — Z992 Dependence on renal dialysis: Secondary | ICD-10-CM | POA: Diagnosis not present

## 2018-07-22 DIAGNOSIS — E611 Iron deficiency: Secondary | ICD-10-CM | POA: Diagnosis not present

## 2018-07-22 DIAGNOSIS — D631 Anemia in chronic kidney disease: Secondary | ICD-10-CM | POA: Diagnosis not present

## 2018-07-22 DIAGNOSIS — N186 End stage renal disease: Secondary | ICD-10-CM | POA: Diagnosis not present

## 2018-07-23 DIAGNOSIS — D631 Anemia in chronic kidney disease: Secondary | ICD-10-CM | POA: Diagnosis not present

## 2018-07-23 DIAGNOSIS — N2581 Secondary hyperparathyroidism of renal origin: Secondary | ICD-10-CM | POA: Diagnosis not present

## 2018-07-23 DIAGNOSIS — E611 Iron deficiency: Secondary | ICD-10-CM | POA: Diagnosis not present

## 2018-07-23 DIAGNOSIS — Z992 Dependence on renal dialysis: Secondary | ICD-10-CM | POA: Diagnosis not present

## 2018-07-23 DIAGNOSIS — N186 End stage renal disease: Secondary | ICD-10-CM | POA: Diagnosis not present

## 2018-07-25 DIAGNOSIS — D631 Anemia in chronic kidney disease: Secondary | ICD-10-CM | POA: Diagnosis not present

## 2018-07-25 DIAGNOSIS — N2581 Secondary hyperparathyroidism of renal origin: Secondary | ICD-10-CM | POA: Diagnosis not present

## 2018-07-25 DIAGNOSIS — E611 Iron deficiency: Secondary | ICD-10-CM | POA: Diagnosis not present

## 2018-07-25 DIAGNOSIS — Z992 Dependence on renal dialysis: Secondary | ICD-10-CM | POA: Diagnosis not present

## 2018-07-25 DIAGNOSIS — N186 End stage renal disease: Secondary | ICD-10-CM | POA: Diagnosis not present

## 2018-07-27 DIAGNOSIS — N186 End stage renal disease: Secondary | ICD-10-CM | POA: Diagnosis not present

## 2018-07-27 DIAGNOSIS — E611 Iron deficiency: Secondary | ICD-10-CM | POA: Diagnosis not present

## 2018-07-27 DIAGNOSIS — D631 Anemia in chronic kidney disease: Secondary | ICD-10-CM | POA: Diagnosis not present

## 2018-07-27 DIAGNOSIS — Z992 Dependence on renal dialysis: Secondary | ICD-10-CM | POA: Diagnosis not present

## 2018-07-27 DIAGNOSIS — N2581 Secondary hyperparathyroidism of renal origin: Secondary | ICD-10-CM | POA: Diagnosis not present

## 2018-07-29 DIAGNOSIS — N2581 Secondary hyperparathyroidism of renal origin: Secondary | ICD-10-CM | POA: Diagnosis not present

## 2018-07-29 DIAGNOSIS — D631 Anemia in chronic kidney disease: Secondary | ICD-10-CM | POA: Diagnosis not present

## 2018-07-29 DIAGNOSIS — Z992 Dependence on renal dialysis: Secondary | ICD-10-CM | POA: Diagnosis not present

## 2018-07-29 DIAGNOSIS — N186 End stage renal disease: Secondary | ICD-10-CM | POA: Diagnosis not present

## 2018-07-29 DIAGNOSIS — E611 Iron deficiency: Secondary | ICD-10-CM | POA: Diagnosis not present

## 2018-08-01 DIAGNOSIS — N186 End stage renal disease: Secondary | ICD-10-CM | POA: Diagnosis not present

## 2018-08-01 DIAGNOSIS — N2581 Secondary hyperparathyroidism of renal origin: Secondary | ICD-10-CM | POA: Diagnosis not present

## 2018-08-01 DIAGNOSIS — Z992 Dependence on renal dialysis: Secondary | ICD-10-CM | POA: Diagnosis not present

## 2018-08-01 DIAGNOSIS — E611 Iron deficiency: Secondary | ICD-10-CM | POA: Diagnosis not present

## 2018-08-01 DIAGNOSIS — D631 Anemia in chronic kidney disease: Secondary | ICD-10-CM | POA: Diagnosis not present

## 2018-08-03 DIAGNOSIS — N186 End stage renal disease: Secondary | ICD-10-CM | POA: Diagnosis not present

## 2018-08-03 DIAGNOSIS — E611 Iron deficiency: Secondary | ICD-10-CM | POA: Diagnosis not present

## 2018-08-03 DIAGNOSIS — N2581 Secondary hyperparathyroidism of renal origin: Secondary | ICD-10-CM | POA: Diagnosis not present

## 2018-08-03 DIAGNOSIS — Z992 Dependence on renal dialysis: Secondary | ICD-10-CM | POA: Diagnosis not present

## 2018-08-03 DIAGNOSIS — D631 Anemia in chronic kidney disease: Secondary | ICD-10-CM | POA: Diagnosis not present

## 2018-08-05 DIAGNOSIS — Z992 Dependence on renal dialysis: Secondary | ICD-10-CM | POA: Diagnosis not present

## 2018-08-05 DIAGNOSIS — N186 End stage renal disease: Secondary | ICD-10-CM | POA: Diagnosis not present

## 2018-08-05 DIAGNOSIS — E611 Iron deficiency: Secondary | ICD-10-CM | POA: Diagnosis not present

## 2018-08-05 DIAGNOSIS — N2581 Secondary hyperparathyroidism of renal origin: Secondary | ICD-10-CM | POA: Diagnosis not present

## 2018-08-05 DIAGNOSIS — D631 Anemia in chronic kidney disease: Secondary | ICD-10-CM | POA: Diagnosis not present

## 2018-08-08 DIAGNOSIS — N186 End stage renal disease: Secondary | ICD-10-CM | POA: Diagnosis not present

## 2018-08-08 DIAGNOSIS — Z992 Dependence on renal dialysis: Secondary | ICD-10-CM | POA: Diagnosis not present

## 2018-08-08 DIAGNOSIS — N2581 Secondary hyperparathyroidism of renal origin: Secondary | ICD-10-CM | POA: Diagnosis not present

## 2018-08-08 DIAGNOSIS — D631 Anemia in chronic kidney disease: Secondary | ICD-10-CM | POA: Diagnosis not present

## 2018-08-08 DIAGNOSIS — E611 Iron deficiency: Secondary | ICD-10-CM | POA: Diagnosis not present

## 2018-08-10 DIAGNOSIS — D631 Anemia in chronic kidney disease: Secondary | ICD-10-CM | POA: Diagnosis not present

## 2018-08-10 DIAGNOSIS — N186 End stage renal disease: Secondary | ICD-10-CM | POA: Diagnosis not present

## 2018-08-10 DIAGNOSIS — Z992 Dependence on renal dialysis: Secondary | ICD-10-CM | POA: Diagnosis not present

## 2018-08-10 DIAGNOSIS — E611 Iron deficiency: Secondary | ICD-10-CM | POA: Diagnosis not present

## 2018-08-10 DIAGNOSIS — N2581 Secondary hyperparathyroidism of renal origin: Secondary | ICD-10-CM | POA: Diagnosis not present

## 2018-08-12 DIAGNOSIS — E611 Iron deficiency: Secondary | ICD-10-CM | POA: Diagnosis not present

## 2018-08-12 DIAGNOSIS — N2581 Secondary hyperparathyroidism of renal origin: Secondary | ICD-10-CM | POA: Diagnosis not present

## 2018-08-12 DIAGNOSIS — Z992 Dependence on renal dialysis: Secondary | ICD-10-CM | POA: Diagnosis not present

## 2018-08-12 DIAGNOSIS — N186 End stage renal disease: Secondary | ICD-10-CM | POA: Diagnosis not present

## 2018-08-12 DIAGNOSIS — D631 Anemia in chronic kidney disease: Secondary | ICD-10-CM | POA: Diagnosis not present

## 2018-08-14 DIAGNOSIS — N2581 Secondary hyperparathyroidism of renal origin: Secondary | ICD-10-CM | POA: Diagnosis not present

## 2018-08-14 DIAGNOSIS — Z992 Dependence on renal dialysis: Secondary | ICD-10-CM | POA: Diagnosis not present

## 2018-08-14 DIAGNOSIS — N186 End stage renal disease: Secondary | ICD-10-CM | POA: Diagnosis not present

## 2018-08-14 DIAGNOSIS — E611 Iron deficiency: Secondary | ICD-10-CM | POA: Diagnosis not present

## 2018-08-14 DIAGNOSIS — D631 Anemia in chronic kidney disease: Secondary | ICD-10-CM | POA: Diagnosis not present

## 2018-08-15 DIAGNOSIS — E611 Iron deficiency: Secondary | ICD-10-CM | POA: Diagnosis not present

## 2018-08-15 DIAGNOSIS — D631 Anemia in chronic kidney disease: Secondary | ICD-10-CM | POA: Diagnosis not present

## 2018-08-15 DIAGNOSIS — N186 End stage renal disease: Secondary | ICD-10-CM | POA: Diagnosis not present

## 2018-08-15 DIAGNOSIS — N2581 Secondary hyperparathyroidism of renal origin: Secondary | ICD-10-CM | POA: Diagnosis not present

## 2018-08-15 DIAGNOSIS — Z992 Dependence on renal dialysis: Secondary | ICD-10-CM | POA: Diagnosis not present

## 2018-08-17 DIAGNOSIS — N2581 Secondary hyperparathyroidism of renal origin: Secondary | ICD-10-CM | POA: Diagnosis not present

## 2018-08-17 DIAGNOSIS — E611 Iron deficiency: Secondary | ICD-10-CM | POA: Diagnosis not present

## 2018-08-17 DIAGNOSIS — N186 End stage renal disease: Secondary | ICD-10-CM | POA: Diagnosis not present

## 2018-08-17 DIAGNOSIS — D631 Anemia in chronic kidney disease: Secondary | ICD-10-CM | POA: Diagnosis not present

## 2018-08-17 DIAGNOSIS — Z992 Dependence on renal dialysis: Secondary | ICD-10-CM | POA: Diagnosis not present

## 2018-08-19 DIAGNOSIS — D631 Anemia in chronic kidney disease: Secondary | ICD-10-CM | POA: Diagnosis not present

## 2018-08-19 DIAGNOSIS — E611 Iron deficiency: Secondary | ICD-10-CM | POA: Diagnosis not present

## 2018-08-19 DIAGNOSIS — N186 End stage renal disease: Secondary | ICD-10-CM | POA: Diagnosis not present

## 2018-08-19 DIAGNOSIS — Z992 Dependence on renal dialysis: Secondary | ICD-10-CM | POA: Diagnosis not present

## 2018-08-19 DIAGNOSIS — N2581 Secondary hyperparathyroidism of renal origin: Secondary | ICD-10-CM | POA: Diagnosis not present

## 2018-08-22 DIAGNOSIS — N186 End stage renal disease: Secondary | ICD-10-CM | POA: Diagnosis not present

## 2018-08-22 DIAGNOSIS — N2581 Secondary hyperparathyroidism of renal origin: Secondary | ICD-10-CM | POA: Diagnosis not present

## 2018-08-22 DIAGNOSIS — E611 Iron deficiency: Secondary | ICD-10-CM | POA: Diagnosis not present

## 2018-08-22 DIAGNOSIS — Z992 Dependence on renal dialysis: Secondary | ICD-10-CM | POA: Diagnosis not present

## 2018-08-22 DIAGNOSIS — D631 Anemia in chronic kidney disease: Secondary | ICD-10-CM | POA: Diagnosis not present

## 2018-08-23 DIAGNOSIS — Z992 Dependence on renal dialysis: Secondary | ICD-10-CM | POA: Diagnosis not present

## 2018-08-23 DIAGNOSIS — D631 Anemia in chronic kidney disease: Secondary | ICD-10-CM | POA: Diagnosis not present

## 2018-08-23 DIAGNOSIS — D509 Iron deficiency anemia, unspecified: Secondary | ICD-10-CM | POA: Diagnosis not present

## 2018-08-23 DIAGNOSIS — N2581 Secondary hyperparathyroidism of renal origin: Secondary | ICD-10-CM | POA: Diagnosis not present

## 2018-08-23 DIAGNOSIS — N186 End stage renal disease: Secondary | ICD-10-CM | POA: Diagnosis not present

## 2018-08-24 DIAGNOSIS — N186 End stage renal disease: Secondary | ICD-10-CM | POA: Diagnosis not present

## 2018-08-24 DIAGNOSIS — Z992 Dependence on renal dialysis: Secondary | ICD-10-CM | POA: Diagnosis not present

## 2018-08-24 DIAGNOSIS — D631 Anemia in chronic kidney disease: Secondary | ICD-10-CM | POA: Diagnosis not present

## 2018-08-24 DIAGNOSIS — N2581 Secondary hyperparathyroidism of renal origin: Secondary | ICD-10-CM | POA: Diagnosis not present

## 2018-08-24 DIAGNOSIS — D509 Iron deficiency anemia, unspecified: Secondary | ICD-10-CM | POA: Diagnosis not present

## 2018-08-26 DIAGNOSIS — D631 Anemia in chronic kidney disease: Secondary | ICD-10-CM | POA: Diagnosis not present

## 2018-08-26 DIAGNOSIS — N2581 Secondary hyperparathyroidism of renal origin: Secondary | ICD-10-CM | POA: Diagnosis not present

## 2018-08-26 DIAGNOSIS — D509 Iron deficiency anemia, unspecified: Secondary | ICD-10-CM | POA: Diagnosis not present

## 2018-08-26 DIAGNOSIS — N186 End stage renal disease: Secondary | ICD-10-CM | POA: Diagnosis not present

## 2018-08-26 DIAGNOSIS — Z992 Dependence on renal dialysis: Secondary | ICD-10-CM | POA: Diagnosis not present

## 2018-08-29 DIAGNOSIS — D509 Iron deficiency anemia, unspecified: Secondary | ICD-10-CM | POA: Diagnosis not present

## 2018-08-29 DIAGNOSIS — Z992 Dependence on renal dialysis: Secondary | ICD-10-CM | POA: Diagnosis not present

## 2018-08-29 DIAGNOSIS — N2581 Secondary hyperparathyroidism of renal origin: Secondary | ICD-10-CM | POA: Diagnosis not present

## 2018-08-29 DIAGNOSIS — D631 Anemia in chronic kidney disease: Secondary | ICD-10-CM | POA: Diagnosis not present

## 2018-08-29 DIAGNOSIS — N186 End stage renal disease: Secondary | ICD-10-CM | POA: Diagnosis not present

## 2018-08-31 DIAGNOSIS — D509 Iron deficiency anemia, unspecified: Secondary | ICD-10-CM | POA: Diagnosis not present

## 2018-08-31 DIAGNOSIS — D631 Anemia in chronic kidney disease: Secondary | ICD-10-CM | POA: Diagnosis not present

## 2018-08-31 DIAGNOSIS — N2581 Secondary hyperparathyroidism of renal origin: Secondary | ICD-10-CM | POA: Diagnosis not present

## 2018-08-31 DIAGNOSIS — Z992 Dependence on renal dialysis: Secondary | ICD-10-CM | POA: Diagnosis not present

## 2018-08-31 DIAGNOSIS — N186 End stage renal disease: Secondary | ICD-10-CM | POA: Diagnosis not present

## 2018-09-02 DIAGNOSIS — D631 Anemia in chronic kidney disease: Secondary | ICD-10-CM | POA: Diagnosis not present

## 2018-09-02 DIAGNOSIS — N2581 Secondary hyperparathyroidism of renal origin: Secondary | ICD-10-CM | POA: Diagnosis not present

## 2018-09-02 DIAGNOSIS — D509 Iron deficiency anemia, unspecified: Secondary | ICD-10-CM | POA: Diagnosis not present

## 2018-09-02 DIAGNOSIS — Z992 Dependence on renal dialysis: Secondary | ICD-10-CM | POA: Diagnosis not present

## 2018-09-02 DIAGNOSIS — N186 End stage renal disease: Secondary | ICD-10-CM | POA: Diagnosis not present

## 2018-09-05 DIAGNOSIS — Z992 Dependence on renal dialysis: Secondary | ICD-10-CM | POA: Diagnosis not present

## 2018-09-05 DIAGNOSIS — N186 End stage renal disease: Secondary | ICD-10-CM | POA: Diagnosis not present

## 2018-09-05 DIAGNOSIS — N2581 Secondary hyperparathyroidism of renal origin: Secondary | ICD-10-CM | POA: Diagnosis not present

## 2018-09-05 DIAGNOSIS — D509 Iron deficiency anemia, unspecified: Secondary | ICD-10-CM | POA: Diagnosis not present

## 2018-09-05 DIAGNOSIS — D631 Anemia in chronic kidney disease: Secondary | ICD-10-CM | POA: Diagnosis not present

## 2018-09-06 DIAGNOSIS — I120 Hypertensive chronic kidney disease with stage 5 chronic kidney disease or end stage renal disease: Secondary | ICD-10-CM | POA: Diagnosis not present

## 2018-09-06 DIAGNOSIS — J449 Chronic obstructive pulmonary disease, unspecified: Secondary | ICD-10-CM | POA: Diagnosis not present

## 2018-09-06 DIAGNOSIS — F17200 Nicotine dependence, unspecified, uncomplicated: Secondary | ICD-10-CM | POA: Diagnosis not present

## 2018-09-06 DIAGNOSIS — M545 Low back pain: Secondary | ICD-10-CM | POA: Diagnosis not present

## 2018-09-07 DIAGNOSIS — Z992 Dependence on renal dialysis: Secondary | ICD-10-CM | POA: Diagnosis not present

## 2018-09-07 DIAGNOSIS — D509 Iron deficiency anemia, unspecified: Secondary | ICD-10-CM | POA: Diagnosis not present

## 2018-09-07 DIAGNOSIS — N186 End stage renal disease: Secondary | ICD-10-CM | POA: Diagnosis not present

## 2018-09-07 DIAGNOSIS — N2581 Secondary hyperparathyroidism of renal origin: Secondary | ICD-10-CM | POA: Diagnosis not present

## 2018-09-07 DIAGNOSIS — D631 Anemia in chronic kidney disease: Secondary | ICD-10-CM | POA: Diagnosis not present

## 2018-09-09 DIAGNOSIS — N186 End stage renal disease: Secondary | ICD-10-CM | POA: Diagnosis not present

## 2018-09-09 DIAGNOSIS — N2581 Secondary hyperparathyroidism of renal origin: Secondary | ICD-10-CM | POA: Diagnosis not present

## 2018-09-09 DIAGNOSIS — Z992 Dependence on renal dialysis: Secondary | ICD-10-CM | POA: Diagnosis not present

## 2018-09-09 DIAGNOSIS — D631 Anemia in chronic kidney disease: Secondary | ICD-10-CM | POA: Diagnosis not present

## 2018-09-09 DIAGNOSIS — D509 Iron deficiency anemia, unspecified: Secondary | ICD-10-CM | POA: Diagnosis not present

## 2018-09-12 DIAGNOSIS — N186 End stage renal disease: Secondary | ICD-10-CM | POA: Diagnosis not present

## 2018-09-12 DIAGNOSIS — D509 Iron deficiency anemia, unspecified: Secondary | ICD-10-CM | POA: Diagnosis not present

## 2018-09-12 DIAGNOSIS — N2581 Secondary hyperparathyroidism of renal origin: Secondary | ICD-10-CM | POA: Diagnosis not present

## 2018-09-12 DIAGNOSIS — Z992 Dependence on renal dialysis: Secondary | ICD-10-CM | POA: Diagnosis not present

## 2018-09-12 DIAGNOSIS — D631 Anemia in chronic kidney disease: Secondary | ICD-10-CM | POA: Diagnosis not present

## 2018-09-13 DIAGNOSIS — D631 Anemia in chronic kidney disease: Secondary | ICD-10-CM | POA: Diagnosis not present

## 2018-09-13 DIAGNOSIS — Z992 Dependence on renal dialysis: Secondary | ICD-10-CM | POA: Diagnosis not present

## 2018-09-13 DIAGNOSIS — N2581 Secondary hyperparathyroidism of renal origin: Secondary | ICD-10-CM | POA: Diagnosis not present

## 2018-09-13 DIAGNOSIS — N186 End stage renal disease: Secondary | ICD-10-CM | POA: Diagnosis not present

## 2018-09-13 DIAGNOSIS — D509 Iron deficiency anemia, unspecified: Secondary | ICD-10-CM | POA: Diagnosis not present

## 2018-09-14 DIAGNOSIS — N2581 Secondary hyperparathyroidism of renal origin: Secondary | ICD-10-CM | POA: Diagnosis not present

## 2018-09-14 DIAGNOSIS — D631 Anemia in chronic kidney disease: Secondary | ICD-10-CM | POA: Diagnosis not present

## 2018-09-14 DIAGNOSIS — Z992 Dependence on renal dialysis: Secondary | ICD-10-CM | POA: Diagnosis not present

## 2018-09-14 DIAGNOSIS — N186 End stage renal disease: Secondary | ICD-10-CM | POA: Diagnosis not present

## 2018-09-14 DIAGNOSIS — D509 Iron deficiency anemia, unspecified: Secondary | ICD-10-CM | POA: Diagnosis not present

## 2018-09-16 DIAGNOSIS — N2581 Secondary hyperparathyroidism of renal origin: Secondary | ICD-10-CM | POA: Diagnosis not present

## 2018-09-16 DIAGNOSIS — N186 End stage renal disease: Secondary | ICD-10-CM | POA: Diagnosis not present

## 2018-09-16 DIAGNOSIS — Z992 Dependence on renal dialysis: Secondary | ICD-10-CM | POA: Diagnosis not present

## 2018-09-16 DIAGNOSIS — D509 Iron deficiency anemia, unspecified: Secondary | ICD-10-CM | POA: Diagnosis not present

## 2018-09-16 DIAGNOSIS — D631 Anemia in chronic kidney disease: Secondary | ICD-10-CM | POA: Diagnosis not present

## 2018-09-19 DIAGNOSIS — D509 Iron deficiency anemia, unspecified: Secondary | ICD-10-CM | POA: Diagnosis not present

## 2018-09-19 DIAGNOSIS — N2581 Secondary hyperparathyroidism of renal origin: Secondary | ICD-10-CM | POA: Diagnosis not present

## 2018-09-19 DIAGNOSIS — N186 End stage renal disease: Secondary | ICD-10-CM | POA: Diagnosis not present

## 2018-09-19 DIAGNOSIS — Z992 Dependence on renal dialysis: Secondary | ICD-10-CM | POA: Diagnosis not present

## 2018-09-19 DIAGNOSIS — D631 Anemia in chronic kidney disease: Secondary | ICD-10-CM | POA: Diagnosis not present

## 2018-09-21 DIAGNOSIS — D509 Iron deficiency anemia, unspecified: Secondary | ICD-10-CM | POA: Diagnosis not present

## 2018-09-21 DIAGNOSIS — D631 Anemia in chronic kidney disease: Secondary | ICD-10-CM | POA: Diagnosis not present

## 2018-09-21 DIAGNOSIS — N186 End stage renal disease: Secondary | ICD-10-CM | POA: Diagnosis not present

## 2018-09-21 DIAGNOSIS — Z992 Dependence on renal dialysis: Secondary | ICD-10-CM | POA: Diagnosis not present

## 2018-09-21 DIAGNOSIS — N2581 Secondary hyperparathyroidism of renal origin: Secondary | ICD-10-CM | POA: Diagnosis not present

## 2018-09-22 DIAGNOSIS — N2581 Secondary hyperparathyroidism of renal origin: Secondary | ICD-10-CM | POA: Diagnosis not present

## 2018-09-22 DIAGNOSIS — D631 Anemia in chronic kidney disease: Secondary | ICD-10-CM | POA: Diagnosis not present

## 2018-09-22 DIAGNOSIS — E611 Iron deficiency: Secondary | ICD-10-CM | POA: Diagnosis not present

## 2018-09-22 DIAGNOSIS — Z992 Dependence on renal dialysis: Secondary | ICD-10-CM | POA: Diagnosis not present

## 2018-09-22 DIAGNOSIS — N186 End stage renal disease: Secondary | ICD-10-CM | POA: Diagnosis not present

## 2018-09-23 DIAGNOSIS — N2581 Secondary hyperparathyroidism of renal origin: Secondary | ICD-10-CM | POA: Diagnosis not present

## 2018-09-23 DIAGNOSIS — E611 Iron deficiency: Secondary | ICD-10-CM | POA: Diagnosis not present

## 2018-09-23 DIAGNOSIS — D631 Anemia in chronic kidney disease: Secondary | ICD-10-CM | POA: Diagnosis not present

## 2018-09-23 DIAGNOSIS — N186 End stage renal disease: Secondary | ICD-10-CM | POA: Diagnosis not present

## 2018-09-23 DIAGNOSIS — Z992 Dependence on renal dialysis: Secondary | ICD-10-CM | POA: Diagnosis not present

## 2018-09-26 DIAGNOSIS — D631 Anemia in chronic kidney disease: Secondary | ICD-10-CM | POA: Diagnosis not present

## 2018-09-26 DIAGNOSIS — N186 End stage renal disease: Secondary | ICD-10-CM | POA: Diagnosis not present

## 2018-09-26 DIAGNOSIS — Z992 Dependence on renal dialysis: Secondary | ICD-10-CM | POA: Diagnosis not present

## 2018-09-26 DIAGNOSIS — N2581 Secondary hyperparathyroidism of renal origin: Secondary | ICD-10-CM | POA: Diagnosis not present

## 2018-09-26 DIAGNOSIS — E611 Iron deficiency: Secondary | ICD-10-CM | POA: Diagnosis not present

## 2018-09-28 DIAGNOSIS — N2581 Secondary hyperparathyroidism of renal origin: Secondary | ICD-10-CM | POA: Diagnosis not present

## 2018-09-28 DIAGNOSIS — N186 End stage renal disease: Secondary | ICD-10-CM | POA: Diagnosis not present

## 2018-09-28 DIAGNOSIS — E611 Iron deficiency: Secondary | ICD-10-CM | POA: Diagnosis not present

## 2018-09-28 DIAGNOSIS — D631 Anemia in chronic kidney disease: Secondary | ICD-10-CM | POA: Diagnosis not present

## 2018-09-28 DIAGNOSIS — Z992 Dependence on renal dialysis: Secondary | ICD-10-CM | POA: Diagnosis not present

## 2018-09-30 DIAGNOSIS — Z992 Dependence on renal dialysis: Secondary | ICD-10-CM | POA: Diagnosis not present

## 2018-09-30 DIAGNOSIS — N186 End stage renal disease: Secondary | ICD-10-CM | POA: Diagnosis not present

## 2018-09-30 DIAGNOSIS — N2581 Secondary hyperparathyroidism of renal origin: Secondary | ICD-10-CM | POA: Diagnosis not present

## 2018-09-30 DIAGNOSIS — E611 Iron deficiency: Secondary | ICD-10-CM | POA: Diagnosis not present

## 2018-09-30 DIAGNOSIS — D631 Anemia in chronic kidney disease: Secondary | ICD-10-CM | POA: Diagnosis not present

## 2018-10-03 DIAGNOSIS — N2581 Secondary hyperparathyroidism of renal origin: Secondary | ICD-10-CM | POA: Diagnosis not present

## 2018-10-03 DIAGNOSIS — Z992 Dependence on renal dialysis: Secondary | ICD-10-CM | POA: Diagnosis not present

## 2018-10-03 DIAGNOSIS — D631 Anemia in chronic kidney disease: Secondary | ICD-10-CM | POA: Diagnosis not present

## 2018-10-03 DIAGNOSIS — N186 End stage renal disease: Secondary | ICD-10-CM | POA: Diagnosis not present

## 2018-10-03 DIAGNOSIS — E611 Iron deficiency: Secondary | ICD-10-CM | POA: Diagnosis not present

## 2018-10-05 DIAGNOSIS — N2581 Secondary hyperparathyroidism of renal origin: Secondary | ICD-10-CM | POA: Diagnosis not present

## 2018-10-05 DIAGNOSIS — N186 End stage renal disease: Secondary | ICD-10-CM | POA: Diagnosis not present

## 2018-10-05 DIAGNOSIS — E611 Iron deficiency: Secondary | ICD-10-CM | POA: Diagnosis not present

## 2018-10-05 DIAGNOSIS — D631 Anemia in chronic kidney disease: Secondary | ICD-10-CM | POA: Diagnosis not present

## 2018-10-05 DIAGNOSIS — Z992 Dependence on renal dialysis: Secondary | ICD-10-CM | POA: Diagnosis not present

## 2018-10-07 DIAGNOSIS — E611 Iron deficiency: Secondary | ICD-10-CM | POA: Diagnosis not present

## 2018-10-07 DIAGNOSIS — Z992 Dependence on renal dialysis: Secondary | ICD-10-CM | POA: Diagnosis not present

## 2018-10-07 DIAGNOSIS — N2581 Secondary hyperparathyroidism of renal origin: Secondary | ICD-10-CM | POA: Diagnosis not present

## 2018-10-07 DIAGNOSIS — D631 Anemia in chronic kidney disease: Secondary | ICD-10-CM | POA: Diagnosis not present

## 2018-10-07 DIAGNOSIS — N186 End stage renal disease: Secondary | ICD-10-CM | POA: Diagnosis not present

## 2018-10-10 DIAGNOSIS — N186 End stage renal disease: Secondary | ICD-10-CM | POA: Diagnosis not present

## 2018-10-10 DIAGNOSIS — D631 Anemia in chronic kidney disease: Secondary | ICD-10-CM | POA: Diagnosis not present

## 2018-10-10 DIAGNOSIS — Z992 Dependence on renal dialysis: Secondary | ICD-10-CM | POA: Diagnosis not present

## 2018-10-10 DIAGNOSIS — N2581 Secondary hyperparathyroidism of renal origin: Secondary | ICD-10-CM | POA: Diagnosis not present

## 2018-10-10 DIAGNOSIS — E611 Iron deficiency: Secondary | ICD-10-CM | POA: Diagnosis not present

## 2018-10-12 DIAGNOSIS — D631 Anemia in chronic kidney disease: Secondary | ICD-10-CM | POA: Diagnosis not present

## 2018-10-12 DIAGNOSIS — Z992 Dependence on renal dialysis: Secondary | ICD-10-CM | POA: Diagnosis not present

## 2018-10-12 DIAGNOSIS — E611 Iron deficiency: Secondary | ICD-10-CM | POA: Diagnosis not present

## 2018-10-12 DIAGNOSIS — N186 End stage renal disease: Secondary | ICD-10-CM | POA: Diagnosis not present

## 2018-10-12 DIAGNOSIS — N2581 Secondary hyperparathyroidism of renal origin: Secondary | ICD-10-CM | POA: Diagnosis not present

## 2018-10-13 DIAGNOSIS — D631 Anemia in chronic kidney disease: Secondary | ICD-10-CM | POA: Diagnosis not present

## 2018-10-13 DIAGNOSIS — E611 Iron deficiency: Secondary | ICD-10-CM | POA: Diagnosis not present

## 2018-10-13 DIAGNOSIS — Z992 Dependence on renal dialysis: Secondary | ICD-10-CM | POA: Diagnosis not present

## 2018-10-13 DIAGNOSIS — N186 End stage renal disease: Secondary | ICD-10-CM | POA: Diagnosis not present

## 2018-10-13 DIAGNOSIS — N2581 Secondary hyperparathyroidism of renal origin: Secondary | ICD-10-CM | POA: Diagnosis not present

## 2018-10-14 DIAGNOSIS — N2581 Secondary hyperparathyroidism of renal origin: Secondary | ICD-10-CM | POA: Diagnosis not present

## 2018-10-14 DIAGNOSIS — E611 Iron deficiency: Secondary | ICD-10-CM | POA: Diagnosis not present

## 2018-10-14 DIAGNOSIS — N186 End stage renal disease: Secondary | ICD-10-CM | POA: Diagnosis not present

## 2018-10-14 DIAGNOSIS — Z992 Dependence on renal dialysis: Secondary | ICD-10-CM | POA: Diagnosis not present

## 2018-10-14 DIAGNOSIS — D631 Anemia in chronic kidney disease: Secondary | ICD-10-CM | POA: Diagnosis not present

## 2018-10-17 DIAGNOSIS — E611 Iron deficiency: Secondary | ICD-10-CM | POA: Diagnosis not present

## 2018-10-17 DIAGNOSIS — D631 Anemia in chronic kidney disease: Secondary | ICD-10-CM | POA: Diagnosis not present

## 2018-10-17 DIAGNOSIS — N2581 Secondary hyperparathyroidism of renal origin: Secondary | ICD-10-CM | POA: Diagnosis not present

## 2018-10-17 DIAGNOSIS — Z992 Dependence on renal dialysis: Secondary | ICD-10-CM | POA: Diagnosis not present

## 2018-10-17 DIAGNOSIS — N186 End stage renal disease: Secondary | ICD-10-CM | POA: Diagnosis not present

## 2018-10-19 DIAGNOSIS — D631 Anemia in chronic kidney disease: Secondary | ICD-10-CM | POA: Diagnosis not present

## 2018-10-19 DIAGNOSIS — N186 End stage renal disease: Secondary | ICD-10-CM | POA: Diagnosis not present

## 2018-10-19 DIAGNOSIS — N2581 Secondary hyperparathyroidism of renal origin: Secondary | ICD-10-CM | POA: Diagnosis not present

## 2018-10-19 DIAGNOSIS — E611 Iron deficiency: Secondary | ICD-10-CM | POA: Diagnosis not present

## 2018-10-19 DIAGNOSIS — Z992 Dependence on renal dialysis: Secondary | ICD-10-CM | POA: Diagnosis not present

## 2018-10-21 DIAGNOSIS — N186 End stage renal disease: Secondary | ICD-10-CM | POA: Diagnosis not present

## 2018-10-21 DIAGNOSIS — N2581 Secondary hyperparathyroidism of renal origin: Secondary | ICD-10-CM | POA: Diagnosis not present

## 2018-10-21 DIAGNOSIS — E611 Iron deficiency: Secondary | ICD-10-CM | POA: Diagnosis not present

## 2018-10-21 DIAGNOSIS — Z992 Dependence on renal dialysis: Secondary | ICD-10-CM | POA: Diagnosis not present

## 2018-10-21 DIAGNOSIS — D631 Anemia in chronic kidney disease: Secondary | ICD-10-CM | POA: Diagnosis not present

## 2018-10-23 DIAGNOSIS — E611 Iron deficiency: Secondary | ICD-10-CM | POA: Diagnosis not present

## 2018-10-23 DIAGNOSIS — N2581 Secondary hyperparathyroidism of renal origin: Secondary | ICD-10-CM | POA: Diagnosis not present

## 2018-10-23 DIAGNOSIS — N186 End stage renal disease: Secondary | ICD-10-CM | POA: Diagnosis not present

## 2018-10-23 DIAGNOSIS — Z992 Dependence on renal dialysis: Secondary | ICD-10-CM | POA: Diagnosis not present

## 2018-10-24 DIAGNOSIS — E611 Iron deficiency: Secondary | ICD-10-CM | POA: Diagnosis not present

## 2018-10-24 DIAGNOSIS — N2581 Secondary hyperparathyroidism of renal origin: Secondary | ICD-10-CM | POA: Diagnosis not present

## 2018-10-24 DIAGNOSIS — N186 End stage renal disease: Secondary | ICD-10-CM | POA: Diagnosis not present

## 2018-10-24 DIAGNOSIS — Z992 Dependence on renal dialysis: Secondary | ICD-10-CM | POA: Diagnosis not present

## 2018-10-26 DIAGNOSIS — N186 End stage renal disease: Secondary | ICD-10-CM | POA: Diagnosis not present

## 2018-10-26 DIAGNOSIS — N2581 Secondary hyperparathyroidism of renal origin: Secondary | ICD-10-CM | POA: Diagnosis not present

## 2018-10-26 DIAGNOSIS — Z992 Dependence on renal dialysis: Secondary | ICD-10-CM | POA: Diagnosis not present

## 2018-10-26 DIAGNOSIS — E611 Iron deficiency: Secondary | ICD-10-CM | POA: Diagnosis not present

## 2018-10-28 DIAGNOSIS — Z992 Dependence on renal dialysis: Secondary | ICD-10-CM | POA: Diagnosis not present

## 2018-10-28 DIAGNOSIS — E611 Iron deficiency: Secondary | ICD-10-CM | POA: Diagnosis not present

## 2018-10-28 DIAGNOSIS — N186 End stage renal disease: Secondary | ICD-10-CM | POA: Diagnosis not present

## 2018-10-28 DIAGNOSIS — N2581 Secondary hyperparathyroidism of renal origin: Secondary | ICD-10-CM | POA: Diagnosis not present

## 2018-10-31 DIAGNOSIS — N2581 Secondary hyperparathyroidism of renal origin: Secondary | ICD-10-CM | POA: Diagnosis not present

## 2018-10-31 DIAGNOSIS — Z992 Dependence on renal dialysis: Secondary | ICD-10-CM | POA: Diagnosis not present

## 2018-10-31 DIAGNOSIS — N186 End stage renal disease: Secondary | ICD-10-CM | POA: Diagnosis not present

## 2018-10-31 DIAGNOSIS — E611 Iron deficiency: Secondary | ICD-10-CM | POA: Diagnosis not present

## 2018-11-02 DIAGNOSIS — N186 End stage renal disease: Secondary | ICD-10-CM | POA: Diagnosis not present

## 2018-11-02 DIAGNOSIS — Z992 Dependence on renal dialysis: Secondary | ICD-10-CM | POA: Diagnosis not present

## 2018-11-02 DIAGNOSIS — E611 Iron deficiency: Secondary | ICD-10-CM | POA: Diagnosis not present

## 2018-11-02 DIAGNOSIS — N2581 Secondary hyperparathyroidism of renal origin: Secondary | ICD-10-CM | POA: Diagnosis not present

## 2018-11-04 DIAGNOSIS — Z992 Dependence on renal dialysis: Secondary | ICD-10-CM | POA: Diagnosis not present

## 2018-11-04 DIAGNOSIS — N2581 Secondary hyperparathyroidism of renal origin: Secondary | ICD-10-CM | POA: Diagnosis not present

## 2018-11-04 DIAGNOSIS — E611 Iron deficiency: Secondary | ICD-10-CM | POA: Diagnosis not present

## 2018-11-04 DIAGNOSIS — N186 End stage renal disease: Secondary | ICD-10-CM | POA: Diagnosis not present

## 2018-11-07 DIAGNOSIS — Z992 Dependence on renal dialysis: Secondary | ICD-10-CM | POA: Diagnosis not present

## 2018-11-07 DIAGNOSIS — N186 End stage renal disease: Secondary | ICD-10-CM | POA: Diagnosis not present

## 2018-11-07 DIAGNOSIS — N2581 Secondary hyperparathyroidism of renal origin: Secondary | ICD-10-CM | POA: Diagnosis not present

## 2018-11-07 DIAGNOSIS — E611 Iron deficiency: Secondary | ICD-10-CM | POA: Diagnosis not present

## 2018-11-09 DIAGNOSIS — Z992 Dependence on renal dialysis: Secondary | ICD-10-CM | POA: Diagnosis not present

## 2018-11-09 DIAGNOSIS — E611 Iron deficiency: Secondary | ICD-10-CM | POA: Diagnosis not present

## 2018-11-09 DIAGNOSIS — N2581 Secondary hyperparathyroidism of renal origin: Secondary | ICD-10-CM | POA: Diagnosis not present

## 2018-11-09 DIAGNOSIS — N186 End stage renal disease: Secondary | ICD-10-CM | POA: Diagnosis not present

## 2018-11-11 DIAGNOSIS — N186 End stage renal disease: Secondary | ICD-10-CM | POA: Diagnosis not present

## 2018-11-11 DIAGNOSIS — E611 Iron deficiency: Secondary | ICD-10-CM | POA: Diagnosis not present

## 2018-11-11 DIAGNOSIS — N2581 Secondary hyperparathyroidism of renal origin: Secondary | ICD-10-CM | POA: Diagnosis not present

## 2018-11-11 DIAGNOSIS — Z992 Dependence on renal dialysis: Secondary | ICD-10-CM | POA: Diagnosis not present

## 2018-11-12 DIAGNOSIS — Z992 Dependence on renal dialysis: Secondary | ICD-10-CM | POA: Diagnosis not present

## 2018-11-12 DIAGNOSIS — E611 Iron deficiency: Secondary | ICD-10-CM | POA: Diagnosis not present

## 2018-11-12 DIAGNOSIS — N186 End stage renal disease: Secondary | ICD-10-CM | POA: Diagnosis not present

## 2018-11-12 DIAGNOSIS — N2581 Secondary hyperparathyroidism of renal origin: Secondary | ICD-10-CM | POA: Diagnosis not present

## 2018-11-14 DIAGNOSIS — N2581 Secondary hyperparathyroidism of renal origin: Secondary | ICD-10-CM | POA: Diagnosis not present

## 2018-11-14 DIAGNOSIS — N186 End stage renal disease: Secondary | ICD-10-CM | POA: Diagnosis not present

## 2018-11-14 DIAGNOSIS — E611 Iron deficiency: Secondary | ICD-10-CM | POA: Diagnosis not present

## 2018-11-14 DIAGNOSIS — Z992 Dependence on renal dialysis: Secondary | ICD-10-CM | POA: Diagnosis not present

## 2018-11-16 DIAGNOSIS — N186 End stage renal disease: Secondary | ICD-10-CM | POA: Diagnosis not present

## 2018-11-16 DIAGNOSIS — N2581 Secondary hyperparathyroidism of renal origin: Secondary | ICD-10-CM | POA: Diagnosis not present

## 2018-11-16 DIAGNOSIS — Z992 Dependence on renal dialysis: Secondary | ICD-10-CM | POA: Diagnosis not present

## 2018-11-16 DIAGNOSIS — E611 Iron deficiency: Secondary | ICD-10-CM | POA: Diagnosis not present

## 2018-11-18 DIAGNOSIS — N2581 Secondary hyperparathyroidism of renal origin: Secondary | ICD-10-CM | POA: Diagnosis not present

## 2018-11-18 DIAGNOSIS — Z992 Dependence on renal dialysis: Secondary | ICD-10-CM | POA: Diagnosis not present

## 2018-11-18 DIAGNOSIS — N186 End stage renal disease: Secondary | ICD-10-CM | POA: Diagnosis not present

## 2018-11-18 DIAGNOSIS — E611 Iron deficiency: Secondary | ICD-10-CM | POA: Diagnosis not present

## 2018-11-21 DIAGNOSIS — N2581 Secondary hyperparathyroidism of renal origin: Secondary | ICD-10-CM | POA: Diagnosis not present

## 2018-11-21 DIAGNOSIS — Z992 Dependence on renal dialysis: Secondary | ICD-10-CM | POA: Diagnosis not present

## 2018-11-21 DIAGNOSIS — E611 Iron deficiency: Secondary | ICD-10-CM | POA: Diagnosis not present

## 2018-11-21 DIAGNOSIS — N186 End stage renal disease: Secondary | ICD-10-CM | POA: Diagnosis not present

## 2018-11-22 DIAGNOSIS — N186 End stage renal disease: Secondary | ICD-10-CM | POA: Diagnosis not present

## 2018-11-22 DIAGNOSIS — N2581 Secondary hyperparathyroidism of renal origin: Secondary | ICD-10-CM | POA: Diagnosis not present

## 2018-11-22 DIAGNOSIS — D509 Iron deficiency anemia, unspecified: Secondary | ICD-10-CM | POA: Diagnosis not present

## 2018-11-22 DIAGNOSIS — Z992 Dependence on renal dialysis: Secondary | ICD-10-CM | POA: Diagnosis not present

## 2018-11-22 DIAGNOSIS — D631 Anemia in chronic kidney disease: Secondary | ICD-10-CM | POA: Diagnosis not present

## 2018-11-23 DIAGNOSIS — N2581 Secondary hyperparathyroidism of renal origin: Secondary | ICD-10-CM | POA: Diagnosis not present

## 2018-11-23 DIAGNOSIS — D509 Iron deficiency anemia, unspecified: Secondary | ICD-10-CM | POA: Diagnosis not present

## 2018-11-23 DIAGNOSIS — D631 Anemia in chronic kidney disease: Secondary | ICD-10-CM | POA: Diagnosis not present

## 2018-11-23 DIAGNOSIS — N186 End stage renal disease: Secondary | ICD-10-CM | POA: Diagnosis not present

## 2018-11-23 DIAGNOSIS — Z992 Dependence on renal dialysis: Secondary | ICD-10-CM | POA: Diagnosis not present

## 2018-11-25 DIAGNOSIS — D509 Iron deficiency anemia, unspecified: Secondary | ICD-10-CM | POA: Diagnosis not present

## 2018-11-25 DIAGNOSIS — N2581 Secondary hyperparathyroidism of renal origin: Secondary | ICD-10-CM | POA: Diagnosis not present

## 2018-11-25 DIAGNOSIS — Z992 Dependence on renal dialysis: Secondary | ICD-10-CM | POA: Diagnosis not present

## 2018-11-25 DIAGNOSIS — N186 End stage renal disease: Secondary | ICD-10-CM | POA: Diagnosis not present

## 2018-11-25 DIAGNOSIS — D631 Anemia in chronic kidney disease: Secondary | ICD-10-CM | POA: Diagnosis not present

## 2018-11-28 DIAGNOSIS — N2581 Secondary hyperparathyroidism of renal origin: Secondary | ICD-10-CM | POA: Diagnosis not present

## 2018-11-28 DIAGNOSIS — D509 Iron deficiency anemia, unspecified: Secondary | ICD-10-CM | POA: Diagnosis not present

## 2018-11-28 DIAGNOSIS — Z992 Dependence on renal dialysis: Secondary | ICD-10-CM | POA: Diagnosis not present

## 2018-11-28 DIAGNOSIS — D631 Anemia in chronic kidney disease: Secondary | ICD-10-CM | POA: Diagnosis not present

## 2018-11-28 DIAGNOSIS — N186 End stage renal disease: Secondary | ICD-10-CM | POA: Diagnosis not present

## 2018-11-30 DIAGNOSIS — D509 Iron deficiency anemia, unspecified: Secondary | ICD-10-CM | POA: Diagnosis not present

## 2018-11-30 DIAGNOSIS — D631 Anemia in chronic kidney disease: Secondary | ICD-10-CM | POA: Diagnosis not present

## 2018-11-30 DIAGNOSIS — Z992 Dependence on renal dialysis: Secondary | ICD-10-CM | POA: Diagnosis not present

## 2018-11-30 DIAGNOSIS — N186 End stage renal disease: Secondary | ICD-10-CM | POA: Diagnosis not present

## 2018-11-30 DIAGNOSIS — N2581 Secondary hyperparathyroidism of renal origin: Secondary | ICD-10-CM | POA: Diagnosis not present

## 2018-12-02 DIAGNOSIS — N2581 Secondary hyperparathyroidism of renal origin: Secondary | ICD-10-CM | POA: Diagnosis not present

## 2018-12-02 DIAGNOSIS — D631 Anemia in chronic kidney disease: Secondary | ICD-10-CM | POA: Diagnosis not present

## 2018-12-02 DIAGNOSIS — D509 Iron deficiency anemia, unspecified: Secondary | ICD-10-CM | POA: Diagnosis not present

## 2018-12-02 DIAGNOSIS — N186 End stage renal disease: Secondary | ICD-10-CM | POA: Diagnosis not present

## 2018-12-02 DIAGNOSIS — Z992 Dependence on renal dialysis: Secondary | ICD-10-CM | POA: Diagnosis not present

## 2018-12-05 DIAGNOSIS — Z992 Dependence on renal dialysis: Secondary | ICD-10-CM | POA: Diagnosis not present

## 2018-12-05 DIAGNOSIS — N186 End stage renal disease: Secondary | ICD-10-CM | POA: Diagnosis not present

## 2018-12-05 DIAGNOSIS — D509 Iron deficiency anemia, unspecified: Secondary | ICD-10-CM | POA: Diagnosis not present

## 2018-12-05 DIAGNOSIS — D631 Anemia in chronic kidney disease: Secondary | ICD-10-CM | POA: Diagnosis not present

## 2018-12-05 DIAGNOSIS — N2581 Secondary hyperparathyroidism of renal origin: Secondary | ICD-10-CM | POA: Diagnosis not present

## 2018-12-07 DIAGNOSIS — D631 Anemia in chronic kidney disease: Secondary | ICD-10-CM | POA: Diagnosis not present

## 2018-12-07 DIAGNOSIS — Z992 Dependence on renal dialysis: Secondary | ICD-10-CM | POA: Diagnosis not present

## 2018-12-07 DIAGNOSIS — N186 End stage renal disease: Secondary | ICD-10-CM | POA: Diagnosis not present

## 2018-12-07 DIAGNOSIS — D509 Iron deficiency anemia, unspecified: Secondary | ICD-10-CM | POA: Diagnosis not present

## 2018-12-07 DIAGNOSIS — N2581 Secondary hyperparathyroidism of renal origin: Secondary | ICD-10-CM | POA: Diagnosis not present

## 2018-12-09 DIAGNOSIS — D509 Iron deficiency anemia, unspecified: Secondary | ICD-10-CM | POA: Diagnosis not present

## 2018-12-09 DIAGNOSIS — D631 Anemia in chronic kidney disease: Secondary | ICD-10-CM | POA: Diagnosis not present

## 2018-12-09 DIAGNOSIS — N186 End stage renal disease: Secondary | ICD-10-CM | POA: Diagnosis not present

## 2018-12-09 DIAGNOSIS — N2581 Secondary hyperparathyroidism of renal origin: Secondary | ICD-10-CM | POA: Diagnosis not present

## 2018-12-09 DIAGNOSIS — Z992 Dependence on renal dialysis: Secondary | ICD-10-CM | POA: Diagnosis not present

## 2018-12-12 DIAGNOSIS — Z992 Dependence on renal dialysis: Secondary | ICD-10-CM | POA: Diagnosis not present

## 2018-12-12 DIAGNOSIS — D509 Iron deficiency anemia, unspecified: Secondary | ICD-10-CM | POA: Diagnosis not present

## 2018-12-12 DIAGNOSIS — D631 Anemia in chronic kidney disease: Secondary | ICD-10-CM | POA: Diagnosis not present

## 2018-12-12 DIAGNOSIS — N186 End stage renal disease: Secondary | ICD-10-CM | POA: Diagnosis not present

## 2018-12-12 DIAGNOSIS — N2581 Secondary hyperparathyroidism of renal origin: Secondary | ICD-10-CM | POA: Diagnosis not present

## 2018-12-14 DIAGNOSIS — N2581 Secondary hyperparathyroidism of renal origin: Secondary | ICD-10-CM | POA: Diagnosis not present

## 2018-12-14 DIAGNOSIS — Z992 Dependence on renal dialysis: Secondary | ICD-10-CM | POA: Diagnosis not present

## 2018-12-14 DIAGNOSIS — N186 End stage renal disease: Secondary | ICD-10-CM | POA: Diagnosis not present

## 2018-12-14 DIAGNOSIS — D509 Iron deficiency anemia, unspecified: Secondary | ICD-10-CM | POA: Diagnosis not present

## 2018-12-14 DIAGNOSIS — D631 Anemia in chronic kidney disease: Secondary | ICD-10-CM | POA: Diagnosis not present

## 2018-12-16 DIAGNOSIS — D631 Anemia in chronic kidney disease: Secondary | ICD-10-CM | POA: Diagnosis not present

## 2018-12-16 DIAGNOSIS — N2581 Secondary hyperparathyroidism of renal origin: Secondary | ICD-10-CM | POA: Diagnosis not present

## 2018-12-16 DIAGNOSIS — N186 End stage renal disease: Secondary | ICD-10-CM | POA: Diagnosis not present

## 2018-12-16 DIAGNOSIS — D509 Iron deficiency anemia, unspecified: Secondary | ICD-10-CM | POA: Diagnosis not present

## 2018-12-16 DIAGNOSIS — Z992 Dependence on renal dialysis: Secondary | ICD-10-CM | POA: Diagnosis not present

## 2018-12-19 DIAGNOSIS — Z992 Dependence on renal dialysis: Secondary | ICD-10-CM | POA: Diagnosis not present

## 2018-12-19 DIAGNOSIS — D631 Anemia in chronic kidney disease: Secondary | ICD-10-CM | POA: Diagnosis not present

## 2018-12-19 DIAGNOSIS — D509 Iron deficiency anemia, unspecified: Secondary | ICD-10-CM | POA: Diagnosis not present

## 2018-12-19 DIAGNOSIS — N2581 Secondary hyperparathyroidism of renal origin: Secondary | ICD-10-CM | POA: Diagnosis not present

## 2018-12-19 DIAGNOSIS — N186 End stage renal disease: Secondary | ICD-10-CM | POA: Diagnosis not present

## 2018-12-21 DIAGNOSIS — D631 Anemia in chronic kidney disease: Secondary | ICD-10-CM | POA: Diagnosis not present

## 2018-12-21 DIAGNOSIS — N186 End stage renal disease: Secondary | ICD-10-CM | POA: Diagnosis not present

## 2018-12-21 DIAGNOSIS — Z992 Dependence on renal dialysis: Secondary | ICD-10-CM | POA: Diagnosis not present

## 2018-12-21 DIAGNOSIS — D509 Iron deficiency anemia, unspecified: Secondary | ICD-10-CM | POA: Diagnosis not present

## 2018-12-21 DIAGNOSIS — N2581 Secondary hyperparathyroidism of renal origin: Secondary | ICD-10-CM | POA: Diagnosis not present

## 2018-12-23 DIAGNOSIS — N186 End stage renal disease: Secondary | ICD-10-CM | POA: Diagnosis not present

## 2018-12-23 DIAGNOSIS — Z992 Dependence on renal dialysis: Secondary | ICD-10-CM | POA: Diagnosis not present

## 2018-12-23 DIAGNOSIS — E611 Iron deficiency: Secondary | ICD-10-CM | POA: Diagnosis not present

## 2018-12-23 DIAGNOSIS — N2581 Secondary hyperparathyroidism of renal origin: Secondary | ICD-10-CM | POA: Diagnosis not present

## 2018-12-26 DIAGNOSIS — E611 Iron deficiency: Secondary | ICD-10-CM | POA: Diagnosis not present

## 2018-12-26 DIAGNOSIS — N2581 Secondary hyperparathyroidism of renal origin: Secondary | ICD-10-CM | POA: Diagnosis not present

## 2018-12-26 DIAGNOSIS — N186 End stage renal disease: Secondary | ICD-10-CM | POA: Diagnosis not present

## 2018-12-26 DIAGNOSIS — Z992 Dependence on renal dialysis: Secondary | ICD-10-CM | POA: Diagnosis not present

## 2018-12-28 DIAGNOSIS — N186 End stage renal disease: Secondary | ICD-10-CM | POA: Diagnosis not present

## 2018-12-28 DIAGNOSIS — Z992 Dependence on renal dialysis: Secondary | ICD-10-CM | POA: Diagnosis not present

## 2018-12-28 DIAGNOSIS — N2581 Secondary hyperparathyroidism of renal origin: Secondary | ICD-10-CM | POA: Diagnosis not present

## 2018-12-28 DIAGNOSIS — E611 Iron deficiency: Secondary | ICD-10-CM | POA: Diagnosis not present

## 2018-12-30 DIAGNOSIS — N186 End stage renal disease: Secondary | ICD-10-CM | POA: Diagnosis not present

## 2018-12-30 DIAGNOSIS — N2581 Secondary hyperparathyroidism of renal origin: Secondary | ICD-10-CM | POA: Diagnosis not present

## 2018-12-30 DIAGNOSIS — E611 Iron deficiency: Secondary | ICD-10-CM | POA: Diagnosis not present

## 2018-12-30 DIAGNOSIS — Z992 Dependence on renal dialysis: Secondary | ICD-10-CM | POA: Diagnosis not present

## 2019-01-02 DIAGNOSIS — N186 End stage renal disease: Secondary | ICD-10-CM | POA: Diagnosis not present

## 2019-01-02 DIAGNOSIS — N2581 Secondary hyperparathyroidism of renal origin: Secondary | ICD-10-CM | POA: Diagnosis not present

## 2019-01-02 DIAGNOSIS — E611 Iron deficiency: Secondary | ICD-10-CM | POA: Diagnosis not present

## 2019-01-02 DIAGNOSIS — Z992 Dependence on renal dialysis: Secondary | ICD-10-CM | POA: Diagnosis not present

## 2019-01-04 DIAGNOSIS — Z992 Dependence on renal dialysis: Secondary | ICD-10-CM | POA: Diagnosis not present

## 2019-01-04 DIAGNOSIS — N186 End stage renal disease: Secondary | ICD-10-CM | POA: Diagnosis not present

## 2019-01-04 DIAGNOSIS — N2581 Secondary hyperparathyroidism of renal origin: Secondary | ICD-10-CM | POA: Diagnosis not present

## 2019-01-04 DIAGNOSIS — E611 Iron deficiency: Secondary | ICD-10-CM | POA: Diagnosis not present

## 2019-01-06 DIAGNOSIS — Z992 Dependence on renal dialysis: Secondary | ICD-10-CM | POA: Diagnosis not present

## 2019-01-06 DIAGNOSIS — N2581 Secondary hyperparathyroidism of renal origin: Secondary | ICD-10-CM | POA: Diagnosis not present

## 2019-01-06 DIAGNOSIS — N186 End stage renal disease: Secondary | ICD-10-CM | POA: Diagnosis not present

## 2019-01-06 DIAGNOSIS — E611 Iron deficiency: Secondary | ICD-10-CM | POA: Diagnosis not present

## 2019-01-08 DIAGNOSIS — Z0000 Encounter for general adult medical examination without abnormal findings: Secondary | ICD-10-CM | POA: Diagnosis not present

## 2019-01-09 DIAGNOSIS — N186 End stage renal disease: Secondary | ICD-10-CM | POA: Diagnosis not present

## 2019-01-09 DIAGNOSIS — E611 Iron deficiency: Secondary | ICD-10-CM | POA: Diagnosis not present

## 2019-01-09 DIAGNOSIS — N2581 Secondary hyperparathyroidism of renal origin: Secondary | ICD-10-CM | POA: Diagnosis not present

## 2019-01-09 DIAGNOSIS — Z992 Dependence on renal dialysis: Secondary | ICD-10-CM | POA: Diagnosis not present

## 2019-01-11 DIAGNOSIS — Z992 Dependence on renal dialysis: Secondary | ICD-10-CM | POA: Diagnosis not present

## 2019-01-11 DIAGNOSIS — E611 Iron deficiency: Secondary | ICD-10-CM | POA: Diagnosis not present

## 2019-01-11 DIAGNOSIS — N2581 Secondary hyperparathyroidism of renal origin: Secondary | ICD-10-CM | POA: Diagnosis not present

## 2019-01-11 DIAGNOSIS — N186 End stage renal disease: Secondary | ICD-10-CM | POA: Diagnosis not present

## 2019-01-13 DIAGNOSIS — N2581 Secondary hyperparathyroidism of renal origin: Secondary | ICD-10-CM | POA: Diagnosis not present

## 2019-01-13 DIAGNOSIS — E611 Iron deficiency: Secondary | ICD-10-CM | POA: Diagnosis not present

## 2019-01-13 DIAGNOSIS — N186 End stage renal disease: Secondary | ICD-10-CM | POA: Diagnosis not present

## 2019-01-13 DIAGNOSIS — Z992 Dependence on renal dialysis: Secondary | ICD-10-CM | POA: Diagnosis not present

## 2019-01-16 DIAGNOSIS — Z992 Dependence on renal dialysis: Secondary | ICD-10-CM | POA: Diagnosis not present

## 2019-01-16 DIAGNOSIS — E611 Iron deficiency: Secondary | ICD-10-CM | POA: Diagnosis not present

## 2019-01-16 DIAGNOSIS — N186 End stage renal disease: Secondary | ICD-10-CM | POA: Diagnosis not present

## 2019-01-16 DIAGNOSIS — N2581 Secondary hyperparathyroidism of renal origin: Secondary | ICD-10-CM | POA: Diagnosis not present

## 2019-01-17 ENCOUNTER — Encounter: Payer: Self-pay | Admitting: *Deleted

## 2019-01-18 DIAGNOSIS — Z992 Dependence on renal dialysis: Secondary | ICD-10-CM | POA: Diagnosis not present

## 2019-01-18 DIAGNOSIS — N186 End stage renal disease: Secondary | ICD-10-CM | POA: Diagnosis not present

## 2019-01-18 DIAGNOSIS — N2581 Secondary hyperparathyroidism of renal origin: Secondary | ICD-10-CM | POA: Diagnosis not present

## 2019-01-18 DIAGNOSIS — E611 Iron deficiency: Secondary | ICD-10-CM | POA: Diagnosis not present

## 2019-01-20 DIAGNOSIS — E611 Iron deficiency: Secondary | ICD-10-CM | POA: Diagnosis not present

## 2019-01-20 DIAGNOSIS — N2581 Secondary hyperparathyroidism of renal origin: Secondary | ICD-10-CM | POA: Diagnosis not present

## 2019-01-20 DIAGNOSIS — Z992 Dependence on renal dialysis: Secondary | ICD-10-CM | POA: Diagnosis not present

## 2019-01-20 DIAGNOSIS — N186 End stage renal disease: Secondary | ICD-10-CM | POA: Diagnosis not present

## 2019-01-23 DIAGNOSIS — D631 Anemia in chronic kidney disease: Secondary | ICD-10-CM | POA: Diagnosis not present

## 2019-01-23 DIAGNOSIS — N186 End stage renal disease: Secondary | ICD-10-CM | POA: Diagnosis not present

## 2019-01-23 DIAGNOSIS — N2581 Secondary hyperparathyroidism of renal origin: Secondary | ICD-10-CM | POA: Diagnosis not present

## 2019-01-23 DIAGNOSIS — E611 Iron deficiency: Secondary | ICD-10-CM | POA: Diagnosis not present

## 2019-01-23 DIAGNOSIS — Z992 Dependence on renal dialysis: Secondary | ICD-10-CM | POA: Diagnosis not present

## 2019-01-25 DIAGNOSIS — N186 End stage renal disease: Secondary | ICD-10-CM | POA: Diagnosis not present

## 2019-01-25 DIAGNOSIS — D631 Anemia in chronic kidney disease: Secondary | ICD-10-CM | POA: Diagnosis not present

## 2019-01-25 DIAGNOSIS — N2581 Secondary hyperparathyroidism of renal origin: Secondary | ICD-10-CM | POA: Diagnosis not present

## 2019-01-25 DIAGNOSIS — Z992 Dependence on renal dialysis: Secondary | ICD-10-CM | POA: Diagnosis not present

## 2019-01-25 DIAGNOSIS — E611 Iron deficiency: Secondary | ICD-10-CM | POA: Diagnosis not present

## 2019-01-27 DIAGNOSIS — E611 Iron deficiency: Secondary | ICD-10-CM | POA: Diagnosis not present

## 2019-01-27 DIAGNOSIS — N2581 Secondary hyperparathyroidism of renal origin: Secondary | ICD-10-CM | POA: Diagnosis not present

## 2019-01-27 DIAGNOSIS — N186 End stage renal disease: Secondary | ICD-10-CM | POA: Diagnosis not present

## 2019-01-27 DIAGNOSIS — D631 Anemia in chronic kidney disease: Secondary | ICD-10-CM | POA: Diagnosis not present

## 2019-01-27 DIAGNOSIS — Z992 Dependence on renal dialysis: Secondary | ICD-10-CM | POA: Diagnosis not present

## 2019-01-30 DIAGNOSIS — N186 End stage renal disease: Secondary | ICD-10-CM | POA: Diagnosis not present

## 2019-01-30 DIAGNOSIS — Z992 Dependence on renal dialysis: Secondary | ICD-10-CM | POA: Diagnosis not present

## 2019-01-30 DIAGNOSIS — E611 Iron deficiency: Secondary | ICD-10-CM | POA: Diagnosis not present

## 2019-01-30 DIAGNOSIS — D631 Anemia in chronic kidney disease: Secondary | ICD-10-CM | POA: Diagnosis not present

## 2019-01-30 DIAGNOSIS — N2581 Secondary hyperparathyroidism of renal origin: Secondary | ICD-10-CM | POA: Diagnosis not present

## 2019-02-01 DIAGNOSIS — E611 Iron deficiency: Secondary | ICD-10-CM | POA: Diagnosis not present

## 2019-02-01 DIAGNOSIS — N2581 Secondary hyperparathyroidism of renal origin: Secondary | ICD-10-CM | POA: Diagnosis not present

## 2019-02-01 DIAGNOSIS — Z992 Dependence on renal dialysis: Secondary | ICD-10-CM | POA: Diagnosis not present

## 2019-02-01 DIAGNOSIS — N186 End stage renal disease: Secondary | ICD-10-CM | POA: Diagnosis not present

## 2019-02-01 DIAGNOSIS — D631 Anemia in chronic kidney disease: Secondary | ICD-10-CM | POA: Diagnosis not present

## 2019-02-03 DIAGNOSIS — Z992 Dependence on renal dialysis: Secondary | ICD-10-CM | POA: Diagnosis not present

## 2019-02-03 DIAGNOSIS — N2581 Secondary hyperparathyroidism of renal origin: Secondary | ICD-10-CM | POA: Diagnosis not present

## 2019-02-03 DIAGNOSIS — D631 Anemia in chronic kidney disease: Secondary | ICD-10-CM | POA: Diagnosis not present

## 2019-02-03 DIAGNOSIS — N186 End stage renal disease: Secondary | ICD-10-CM | POA: Diagnosis not present

## 2019-02-03 DIAGNOSIS — E611 Iron deficiency: Secondary | ICD-10-CM | POA: Diagnosis not present

## 2019-02-06 DIAGNOSIS — E611 Iron deficiency: Secondary | ICD-10-CM | POA: Diagnosis not present

## 2019-02-06 DIAGNOSIS — N2581 Secondary hyperparathyroidism of renal origin: Secondary | ICD-10-CM | POA: Diagnosis not present

## 2019-02-06 DIAGNOSIS — Z992 Dependence on renal dialysis: Secondary | ICD-10-CM | POA: Diagnosis not present

## 2019-02-06 DIAGNOSIS — D631 Anemia in chronic kidney disease: Secondary | ICD-10-CM | POA: Diagnosis not present

## 2019-02-06 DIAGNOSIS — N186 End stage renal disease: Secondary | ICD-10-CM | POA: Diagnosis not present

## 2019-02-08 DIAGNOSIS — D631 Anemia in chronic kidney disease: Secondary | ICD-10-CM | POA: Diagnosis not present

## 2019-02-08 DIAGNOSIS — E611 Iron deficiency: Secondary | ICD-10-CM | POA: Diagnosis not present

## 2019-02-08 DIAGNOSIS — N186 End stage renal disease: Secondary | ICD-10-CM | POA: Diagnosis not present

## 2019-02-08 DIAGNOSIS — Z992 Dependence on renal dialysis: Secondary | ICD-10-CM | POA: Diagnosis not present

## 2019-02-08 DIAGNOSIS — N2581 Secondary hyperparathyroidism of renal origin: Secondary | ICD-10-CM | POA: Diagnosis not present

## 2019-02-10 DIAGNOSIS — Z992 Dependence on renal dialysis: Secondary | ICD-10-CM | POA: Diagnosis not present

## 2019-02-10 DIAGNOSIS — N186 End stage renal disease: Secondary | ICD-10-CM | POA: Diagnosis not present

## 2019-02-10 DIAGNOSIS — N2581 Secondary hyperparathyroidism of renal origin: Secondary | ICD-10-CM | POA: Diagnosis not present

## 2019-02-10 DIAGNOSIS — E611 Iron deficiency: Secondary | ICD-10-CM | POA: Diagnosis not present

## 2019-02-10 DIAGNOSIS — D631 Anemia in chronic kidney disease: Secondary | ICD-10-CM | POA: Diagnosis not present

## 2019-02-13 DIAGNOSIS — N2581 Secondary hyperparathyroidism of renal origin: Secondary | ICD-10-CM | POA: Diagnosis not present

## 2019-02-13 DIAGNOSIS — N186 End stage renal disease: Secondary | ICD-10-CM | POA: Diagnosis not present

## 2019-02-13 DIAGNOSIS — E611 Iron deficiency: Secondary | ICD-10-CM | POA: Diagnosis not present

## 2019-02-13 DIAGNOSIS — Z992 Dependence on renal dialysis: Secondary | ICD-10-CM | POA: Diagnosis not present

## 2019-02-13 DIAGNOSIS — D631 Anemia in chronic kidney disease: Secondary | ICD-10-CM | POA: Diagnosis not present

## 2019-02-15 DIAGNOSIS — Z992 Dependence on renal dialysis: Secondary | ICD-10-CM | POA: Diagnosis not present

## 2019-02-15 DIAGNOSIS — D631 Anemia in chronic kidney disease: Secondary | ICD-10-CM | POA: Diagnosis not present

## 2019-02-15 DIAGNOSIS — E611 Iron deficiency: Secondary | ICD-10-CM | POA: Diagnosis not present

## 2019-02-15 DIAGNOSIS — N2581 Secondary hyperparathyroidism of renal origin: Secondary | ICD-10-CM | POA: Diagnosis not present

## 2019-02-15 DIAGNOSIS — N186 End stage renal disease: Secondary | ICD-10-CM | POA: Diagnosis not present

## 2019-02-17 DIAGNOSIS — N186 End stage renal disease: Secondary | ICD-10-CM | POA: Diagnosis not present

## 2019-02-17 DIAGNOSIS — Z992 Dependence on renal dialysis: Secondary | ICD-10-CM | POA: Diagnosis not present

## 2019-02-17 DIAGNOSIS — D631 Anemia in chronic kidney disease: Secondary | ICD-10-CM | POA: Diagnosis not present

## 2019-02-17 DIAGNOSIS — N2581 Secondary hyperparathyroidism of renal origin: Secondary | ICD-10-CM | POA: Diagnosis not present

## 2019-02-17 DIAGNOSIS — E611 Iron deficiency: Secondary | ICD-10-CM | POA: Diagnosis not present

## 2019-02-20 ENCOUNTER — Ambulatory Visit: Payer: Medicare Other

## 2019-02-20 ENCOUNTER — Telehealth: Payer: Self-pay | Admitting: Internal Medicine

## 2019-02-20 DIAGNOSIS — N2581 Secondary hyperparathyroidism of renal origin: Secondary | ICD-10-CM | POA: Diagnosis not present

## 2019-02-20 DIAGNOSIS — D631 Anemia in chronic kidney disease: Secondary | ICD-10-CM | POA: Diagnosis not present

## 2019-02-20 DIAGNOSIS — N186 End stage renal disease: Secondary | ICD-10-CM | POA: Diagnosis not present

## 2019-02-20 DIAGNOSIS — E611 Iron deficiency: Secondary | ICD-10-CM | POA: Diagnosis not present

## 2019-02-20 DIAGNOSIS — Z992 Dependence on renal dialysis: Secondary | ICD-10-CM | POA: Diagnosis not present

## 2019-02-20 NOTE — Telephone Encounter (Signed)
Tried to call pt again to proceed with visit but he did not pick up.

## 2019-02-20 NOTE — Telephone Encounter (Signed)
Pt was scheduled nurse visit for today at 10am. He called at 1017 to let us know he just left having dialysis and was on his way home. Should he reschedule? [phone]

## 2019-02-22 DIAGNOSIS — N2581 Secondary hyperparathyroidism of renal origin: Secondary | ICD-10-CM | POA: Diagnosis not present

## 2019-02-22 DIAGNOSIS — D631 Anemia in chronic kidney disease: Secondary | ICD-10-CM | POA: Diagnosis not present

## 2019-02-22 DIAGNOSIS — D509 Iron deficiency anemia, unspecified: Secondary | ICD-10-CM | POA: Diagnosis not present

## 2019-02-22 DIAGNOSIS — Z992 Dependence on renal dialysis: Secondary | ICD-10-CM | POA: Diagnosis not present

## 2019-02-22 DIAGNOSIS — N186 End stage renal disease: Secondary | ICD-10-CM | POA: Diagnosis not present

## 2019-02-24 DIAGNOSIS — N186 End stage renal disease: Secondary | ICD-10-CM | POA: Diagnosis not present

## 2019-02-24 DIAGNOSIS — D631 Anemia in chronic kidney disease: Secondary | ICD-10-CM | POA: Diagnosis not present

## 2019-02-24 DIAGNOSIS — D509 Iron deficiency anemia, unspecified: Secondary | ICD-10-CM | POA: Diagnosis not present

## 2019-02-24 DIAGNOSIS — N2581 Secondary hyperparathyroidism of renal origin: Secondary | ICD-10-CM | POA: Diagnosis not present

## 2019-02-24 DIAGNOSIS — Z992 Dependence on renal dialysis: Secondary | ICD-10-CM | POA: Diagnosis not present

## 2019-02-27 DIAGNOSIS — N2581 Secondary hyperparathyroidism of renal origin: Secondary | ICD-10-CM | POA: Diagnosis not present

## 2019-02-27 DIAGNOSIS — N186 End stage renal disease: Secondary | ICD-10-CM | POA: Diagnosis not present

## 2019-02-27 DIAGNOSIS — D631 Anemia in chronic kidney disease: Secondary | ICD-10-CM | POA: Diagnosis not present

## 2019-02-27 DIAGNOSIS — D509 Iron deficiency anemia, unspecified: Secondary | ICD-10-CM | POA: Diagnosis not present

## 2019-02-27 DIAGNOSIS — Z992 Dependence on renal dialysis: Secondary | ICD-10-CM | POA: Diagnosis not present

## 2019-03-01 DIAGNOSIS — Z992 Dependence on renal dialysis: Secondary | ICD-10-CM | POA: Diagnosis not present

## 2019-03-01 DIAGNOSIS — D631 Anemia in chronic kidney disease: Secondary | ICD-10-CM | POA: Diagnosis not present

## 2019-03-01 DIAGNOSIS — D509 Iron deficiency anemia, unspecified: Secondary | ICD-10-CM | POA: Diagnosis not present

## 2019-03-01 DIAGNOSIS — N186 End stage renal disease: Secondary | ICD-10-CM | POA: Diagnosis not present

## 2019-03-01 DIAGNOSIS — N2581 Secondary hyperparathyroidism of renal origin: Secondary | ICD-10-CM | POA: Diagnosis not present

## 2019-03-03 DIAGNOSIS — N2581 Secondary hyperparathyroidism of renal origin: Secondary | ICD-10-CM | POA: Diagnosis not present

## 2019-03-03 DIAGNOSIS — N186 End stage renal disease: Secondary | ICD-10-CM | POA: Diagnosis not present

## 2019-03-03 DIAGNOSIS — D631 Anemia in chronic kidney disease: Secondary | ICD-10-CM | POA: Diagnosis not present

## 2019-03-03 DIAGNOSIS — D509 Iron deficiency anemia, unspecified: Secondary | ICD-10-CM | POA: Diagnosis not present

## 2019-03-03 DIAGNOSIS — Z992 Dependence on renal dialysis: Secondary | ICD-10-CM | POA: Diagnosis not present

## 2019-03-06 DIAGNOSIS — Z992 Dependence on renal dialysis: Secondary | ICD-10-CM | POA: Diagnosis not present

## 2019-03-06 DIAGNOSIS — N2581 Secondary hyperparathyroidism of renal origin: Secondary | ICD-10-CM | POA: Diagnosis not present

## 2019-03-06 DIAGNOSIS — D509 Iron deficiency anemia, unspecified: Secondary | ICD-10-CM | POA: Diagnosis not present

## 2019-03-06 DIAGNOSIS — N186 End stage renal disease: Secondary | ICD-10-CM | POA: Diagnosis not present

## 2019-03-06 DIAGNOSIS — D631 Anemia in chronic kidney disease: Secondary | ICD-10-CM | POA: Diagnosis not present

## 2019-03-08 DIAGNOSIS — N186 End stage renal disease: Secondary | ICD-10-CM | POA: Diagnosis not present

## 2019-03-08 DIAGNOSIS — Z992 Dependence on renal dialysis: Secondary | ICD-10-CM | POA: Diagnosis not present

## 2019-03-08 DIAGNOSIS — D509 Iron deficiency anemia, unspecified: Secondary | ICD-10-CM | POA: Diagnosis not present

## 2019-03-08 DIAGNOSIS — D631 Anemia in chronic kidney disease: Secondary | ICD-10-CM | POA: Diagnosis not present

## 2019-03-08 DIAGNOSIS — N2581 Secondary hyperparathyroidism of renal origin: Secondary | ICD-10-CM | POA: Diagnosis not present

## 2019-03-10 DIAGNOSIS — Z992 Dependence on renal dialysis: Secondary | ICD-10-CM | POA: Diagnosis not present

## 2019-03-10 DIAGNOSIS — D509 Iron deficiency anemia, unspecified: Secondary | ICD-10-CM | POA: Diagnosis not present

## 2019-03-10 DIAGNOSIS — N186 End stage renal disease: Secondary | ICD-10-CM | POA: Diagnosis not present

## 2019-03-10 DIAGNOSIS — N2581 Secondary hyperparathyroidism of renal origin: Secondary | ICD-10-CM | POA: Diagnosis not present

## 2019-03-10 DIAGNOSIS — D631 Anemia in chronic kidney disease: Secondary | ICD-10-CM | POA: Diagnosis not present

## 2019-03-12 DIAGNOSIS — D509 Iron deficiency anemia, unspecified: Secondary | ICD-10-CM | POA: Diagnosis not present

## 2019-03-12 DIAGNOSIS — N2581 Secondary hyperparathyroidism of renal origin: Secondary | ICD-10-CM | POA: Diagnosis not present

## 2019-03-12 DIAGNOSIS — N186 End stage renal disease: Secondary | ICD-10-CM | POA: Diagnosis not present

## 2019-03-12 DIAGNOSIS — D631 Anemia in chronic kidney disease: Secondary | ICD-10-CM | POA: Diagnosis not present

## 2019-03-12 DIAGNOSIS — Z992 Dependence on renal dialysis: Secondary | ICD-10-CM | POA: Diagnosis not present

## 2019-03-13 DIAGNOSIS — D631 Anemia in chronic kidney disease: Secondary | ICD-10-CM | POA: Diagnosis not present

## 2019-03-13 DIAGNOSIS — N186 End stage renal disease: Secondary | ICD-10-CM | POA: Diagnosis not present

## 2019-03-13 DIAGNOSIS — Z992 Dependence on renal dialysis: Secondary | ICD-10-CM | POA: Diagnosis not present

## 2019-03-13 DIAGNOSIS — D509 Iron deficiency anemia, unspecified: Secondary | ICD-10-CM | POA: Diagnosis not present

## 2019-03-13 DIAGNOSIS — N2581 Secondary hyperparathyroidism of renal origin: Secondary | ICD-10-CM | POA: Diagnosis not present

## 2019-03-15 DIAGNOSIS — Z992 Dependence on renal dialysis: Secondary | ICD-10-CM | POA: Diagnosis not present

## 2019-03-15 DIAGNOSIS — N2581 Secondary hyperparathyroidism of renal origin: Secondary | ICD-10-CM | POA: Diagnosis not present

## 2019-03-15 DIAGNOSIS — N186 End stage renal disease: Secondary | ICD-10-CM | POA: Diagnosis not present

## 2019-03-15 DIAGNOSIS — D631 Anemia in chronic kidney disease: Secondary | ICD-10-CM | POA: Diagnosis not present

## 2019-03-15 DIAGNOSIS — D509 Iron deficiency anemia, unspecified: Secondary | ICD-10-CM | POA: Diagnosis not present

## 2019-03-17 DIAGNOSIS — N186 End stage renal disease: Secondary | ICD-10-CM | POA: Diagnosis not present

## 2019-03-17 DIAGNOSIS — D509 Iron deficiency anemia, unspecified: Secondary | ICD-10-CM | POA: Diagnosis not present

## 2019-03-17 DIAGNOSIS — N2581 Secondary hyperparathyroidism of renal origin: Secondary | ICD-10-CM | POA: Diagnosis not present

## 2019-03-17 DIAGNOSIS — D631 Anemia in chronic kidney disease: Secondary | ICD-10-CM | POA: Diagnosis not present

## 2019-03-17 DIAGNOSIS — Z992 Dependence on renal dialysis: Secondary | ICD-10-CM | POA: Diagnosis not present

## 2019-03-20 DIAGNOSIS — N2581 Secondary hyperparathyroidism of renal origin: Secondary | ICD-10-CM | POA: Diagnosis not present

## 2019-03-20 DIAGNOSIS — Z992 Dependence on renal dialysis: Secondary | ICD-10-CM | POA: Diagnosis not present

## 2019-03-20 DIAGNOSIS — N186 End stage renal disease: Secondary | ICD-10-CM | POA: Diagnosis not present

## 2019-03-20 DIAGNOSIS — D509 Iron deficiency anemia, unspecified: Secondary | ICD-10-CM | POA: Diagnosis not present

## 2019-03-20 DIAGNOSIS — D631 Anemia in chronic kidney disease: Secondary | ICD-10-CM | POA: Diagnosis not present

## 2019-03-22 DIAGNOSIS — D631 Anemia in chronic kidney disease: Secondary | ICD-10-CM | POA: Diagnosis not present

## 2019-03-22 DIAGNOSIS — N2581 Secondary hyperparathyroidism of renal origin: Secondary | ICD-10-CM | POA: Diagnosis not present

## 2019-03-22 DIAGNOSIS — D509 Iron deficiency anemia, unspecified: Secondary | ICD-10-CM | POA: Diagnosis not present

## 2019-03-22 DIAGNOSIS — N186 End stage renal disease: Secondary | ICD-10-CM | POA: Diagnosis not present

## 2019-03-22 DIAGNOSIS — Z992 Dependence on renal dialysis: Secondary | ICD-10-CM | POA: Diagnosis not present

## 2019-03-24 DIAGNOSIS — N186 End stage renal disease: Secondary | ICD-10-CM | POA: Diagnosis not present

## 2019-03-24 DIAGNOSIS — D509 Iron deficiency anemia, unspecified: Secondary | ICD-10-CM | POA: Diagnosis not present

## 2019-03-24 DIAGNOSIS — D631 Anemia in chronic kidney disease: Secondary | ICD-10-CM | POA: Diagnosis not present

## 2019-03-24 DIAGNOSIS — Z992 Dependence on renal dialysis: Secondary | ICD-10-CM | POA: Diagnosis not present

## 2019-03-24 DIAGNOSIS — N2581 Secondary hyperparathyroidism of renal origin: Secondary | ICD-10-CM | POA: Diagnosis not present

## 2019-03-25 DIAGNOSIS — N186 End stage renal disease: Secondary | ICD-10-CM | POA: Diagnosis not present

## 2019-03-25 DIAGNOSIS — N2581 Secondary hyperparathyroidism of renal origin: Secondary | ICD-10-CM | POA: Diagnosis not present

## 2019-03-25 DIAGNOSIS — D509 Iron deficiency anemia, unspecified: Secondary | ICD-10-CM | POA: Diagnosis not present

## 2019-03-25 DIAGNOSIS — Z992 Dependence on renal dialysis: Secondary | ICD-10-CM | POA: Diagnosis not present

## 2019-03-27 DIAGNOSIS — N186 End stage renal disease: Secondary | ICD-10-CM | POA: Diagnosis not present

## 2019-03-27 DIAGNOSIS — D509 Iron deficiency anemia, unspecified: Secondary | ICD-10-CM | POA: Diagnosis not present

## 2019-03-27 DIAGNOSIS — Z992 Dependence on renal dialysis: Secondary | ICD-10-CM | POA: Diagnosis not present

## 2019-03-27 DIAGNOSIS — N2581 Secondary hyperparathyroidism of renal origin: Secondary | ICD-10-CM | POA: Diagnosis not present

## 2019-03-29 DIAGNOSIS — N2581 Secondary hyperparathyroidism of renal origin: Secondary | ICD-10-CM | POA: Diagnosis not present

## 2019-03-29 DIAGNOSIS — Z992 Dependence on renal dialysis: Secondary | ICD-10-CM | POA: Diagnosis not present

## 2019-03-29 DIAGNOSIS — N186 End stage renal disease: Secondary | ICD-10-CM | POA: Diagnosis not present

## 2019-03-29 DIAGNOSIS — D509 Iron deficiency anemia, unspecified: Secondary | ICD-10-CM | POA: Diagnosis not present

## 2019-03-31 DIAGNOSIS — Z992 Dependence on renal dialysis: Secondary | ICD-10-CM | POA: Diagnosis not present

## 2019-03-31 DIAGNOSIS — D509 Iron deficiency anemia, unspecified: Secondary | ICD-10-CM | POA: Diagnosis not present

## 2019-03-31 DIAGNOSIS — N2581 Secondary hyperparathyroidism of renal origin: Secondary | ICD-10-CM | POA: Diagnosis not present

## 2019-03-31 DIAGNOSIS — N186 End stage renal disease: Secondary | ICD-10-CM | POA: Diagnosis not present

## 2019-04-03 DIAGNOSIS — N186 End stage renal disease: Secondary | ICD-10-CM | POA: Diagnosis not present

## 2019-04-03 DIAGNOSIS — D509 Iron deficiency anemia, unspecified: Secondary | ICD-10-CM | POA: Diagnosis not present

## 2019-04-03 DIAGNOSIS — N2581 Secondary hyperparathyroidism of renal origin: Secondary | ICD-10-CM | POA: Diagnosis not present

## 2019-04-03 DIAGNOSIS — Z992 Dependence on renal dialysis: Secondary | ICD-10-CM | POA: Diagnosis not present

## 2019-04-05 DIAGNOSIS — N186 End stage renal disease: Secondary | ICD-10-CM | POA: Diagnosis not present

## 2019-04-05 DIAGNOSIS — N2581 Secondary hyperparathyroidism of renal origin: Secondary | ICD-10-CM | POA: Diagnosis not present

## 2019-04-05 DIAGNOSIS — Z992 Dependence on renal dialysis: Secondary | ICD-10-CM | POA: Diagnosis not present

## 2019-04-05 DIAGNOSIS — D509 Iron deficiency anemia, unspecified: Secondary | ICD-10-CM | POA: Diagnosis not present

## 2019-04-07 DIAGNOSIS — Z992 Dependence on renal dialysis: Secondary | ICD-10-CM | POA: Diagnosis not present

## 2019-04-07 DIAGNOSIS — N186 End stage renal disease: Secondary | ICD-10-CM | POA: Diagnosis not present

## 2019-04-07 DIAGNOSIS — D509 Iron deficiency anemia, unspecified: Secondary | ICD-10-CM | POA: Diagnosis not present

## 2019-04-07 DIAGNOSIS — N2581 Secondary hyperparathyroidism of renal origin: Secondary | ICD-10-CM | POA: Diagnosis not present

## 2019-04-10 ENCOUNTER — Other Ambulatory Visit (HOSPITAL_COMMUNITY): Payer: Self-pay | Admitting: Respiratory Therapy

## 2019-04-10 DIAGNOSIS — Z992 Dependence on renal dialysis: Secondary | ICD-10-CM | POA: Diagnosis not present

## 2019-04-10 DIAGNOSIS — D509 Iron deficiency anemia, unspecified: Secondary | ICD-10-CM | POA: Diagnosis not present

## 2019-04-10 DIAGNOSIS — N2581 Secondary hyperparathyroidism of renal origin: Secondary | ICD-10-CM | POA: Diagnosis not present

## 2019-04-10 DIAGNOSIS — N186 End stage renal disease: Secondary | ICD-10-CM | POA: Diagnosis not present

## 2019-04-10 DIAGNOSIS — J441 Chronic obstructive pulmonary disease with (acute) exacerbation: Secondary | ICD-10-CM

## 2019-04-11 ENCOUNTER — Ambulatory Visit (INDEPENDENT_AMBULATORY_CARE_PROVIDER_SITE_OTHER): Payer: Self-pay | Admitting: *Deleted

## 2019-04-11 ENCOUNTER — Other Ambulatory Visit: Payer: Self-pay

## 2019-04-11 DIAGNOSIS — Z86010 Personal history of colonic polyps: Secondary | ICD-10-CM

## 2019-04-11 DIAGNOSIS — N186 End stage renal disease: Secondary | ICD-10-CM | POA: Diagnosis not present

## 2019-04-11 DIAGNOSIS — N2581 Secondary hyperparathyroidism of renal origin: Secondary | ICD-10-CM | POA: Diagnosis not present

## 2019-04-11 DIAGNOSIS — D509 Iron deficiency anemia, unspecified: Secondary | ICD-10-CM | POA: Diagnosis not present

## 2019-04-11 DIAGNOSIS — Z992 Dependence on renal dialysis: Secondary | ICD-10-CM | POA: Diagnosis not present

## 2019-04-11 MED ORDER — PEG 3350-KCL-NA BICARB-NACL 420 G PO SOLR: 4000.0000 mL | Freq: Once | ORAL | 0 refills | Status: AC

## 2019-04-11 NOTE — Progress Notes (Signed)
Ok to schedule.

## 2019-04-11 NOTE — Progress Notes (Addendum)
Gastroenterology Pre-Procedure Review  Request Date: 04/11/2019 Requesting Physician: Dr. Luan Pulling, Last TCS 02/04/2014 done by Dr. Gala Romney, tubular adenoma  PATIENT REVIEW QUESTIONS: The patient responded to the following health history questions as indicated:    1. Diabetes Melitis: no 2. Joint replacements in the past 12 months: no 3. Major health problems in the past 3 months: no 4. Has an artificial valve or MVP: no 5. Has a defibrillator: no 6. Has been advised in past to take antibiotics in advance of a procedure like teeth cleaning: no 7. Family history of colon cancer: no  8. Alcohol Use: no 9. Illicit drug Use: no 10. History of sleep apnea: no  11. History of coronary artery or other vascular stents placed within the last 12 months: no 12. History of any prior anesthesia complications: no 13. There is no height or weight on file to calculate BMI.ht: 6'0 wt: 260 lbs    MEDICATIONS & ALLERGIES:    Patient reports the following regarding taking any blood thinners:   Plavix? no Aspirin? yes Coumadin? no Brilinta? no Xarelto? no Eliquis? no Pradaxa? no Savaysa? no Effient? no  Patient confirms/reports the following medications:  Current Outpatient Medications  Medication Sig Dispense Refill  . albuterol (PROAIR HFA) 108 (90 BASE) MCG/ACT inhaler Inhale 2 puffs into the lungs every 4 (four) hours as needed for wheezing. 3 Inhaler 0  . amLODipine (NORVASC) 10 MG tablet TAKE 1 TABLET EVERY DAY 30 tablet 0  . aspirin 325 MG tablet Take 325 mg by mouth daily.    Lorin Picket 1 GM 210 MG(Fe) tablet Take 210 mg by mouth 3 (three) times daily with meals.     [name] b complex-vitamin c-folic acid (NEPHRO-VITE) 0.8 MG TABS Take 0.8 mg by mouth at bedtime.    . budesonide-formoterol (SYMBICORT) 80-4.5 MCG/ACT inhaler Inhale 2 puffs into the lungs 2 (two) times daily. 1 Inhaler 12  . diphenhydramine-acetaminophen (TYLENOL PM) 25-500 MG TABS tablet Take 1 tablet by mouth at bedtime as needed.     . SENSIPAR 30 MG tablet Take 30 mg by mouth daily with breakfast.     . sevelamer (RENVELA) 800 MG tablet Take 800 mg by mouth 3 (three) times daily with meals.    [name] HYDROcodone-acetaminophen (NORCO/VICODIN) 5-325 MG per tablet Take 1 tablet by mouth every 6 (six) hours as needed for pain. (Patient not taking: Reported on 04/11/2019) 90 tablet 0   No current facility-administered medications for this visit.     Patient confirms/reports the following allergies:  No Known Allergies  No orders of the defined types were placed in this encounter.   AUTHORIZATION INFORMATION Primary Insurance: Medicare,  ID #: [id number] Pre-Cert / Auth required: No, not required  Secondary Insurance: [employer],  [place] #: [id number],  Group #: [id number] Pre-Cert / Auth required: No, not required per automated system  SCHEDULE INFORMATION: Procedure has been scheduled as follows:  Date: 07/04/2019, Time: 12:00 Location: [facility] with Dr. Gala Romney  This Gastroenterology Pre-Precedure Review Form is being routed to the following provider(s): Aliene Altes, PA

## 2019-04-11 NOTE — Patient Instructions (Addendum)
Brian Barrera   1971-01-12 MRN: [id number]    Procedure Date: 07/03/2019 Time to register: 10:00 am Place to register: [facility] Procedure Time: 11:00 am Scheduled provider: Dr. Gala Romney  PREPARATION FOR COLONOSCOPY WITH TRI-LYTE SPLIT PREP  Please notify us immediately if you are diabetic, take iron supplements, or if you are on Coumadin or any other blood thinners.   You will need to purchase 1 fleet enema and 1 box of Bisacodyl 44m tablets.   2 DAYS BEFORE PROCEDURE:  DATE: 07/01/2019  DAY: Sunday Begin clear liquid diet AFTER your lunch meal. NO SOLID FOODS after this point.  1 DAY BEFORE PROCEDURE:  DATE: 07/02/2019  DAY: Monday Continue clear liquids the entire day - NO SOLID FOOD.    At 2:00 pm:  Take 2 Bisacodyl tablets.   At 4:00pm:  Start drinking your solution. Make sure you mix well per instructions on the bottle. Try to drink 1 (one) 8 ounce glass every 10-15 minutes until you have consumed HALF the jug. You should complete by 6:00pm.You must keep the left over solution refrigerated until completed next day.  Continue clear liquids. You must drink plenty of clear liquids to prevent dehyration and kidney failure.     DAY OF PROCEDURE:   DATE: 07/03/2019  DAY:  Tuesday If you take medications for your heart, blood pressure or breathing, you may take these medications.    Five hours before your procedure time @ 6:00 am:  Finish remaining amout of bowel prep, drinking 1 (one) 8 ounce glass every 10-15 minutes until complete. You have two hours to consume remaining prep.   Three hours before your procedure time @ 8:00 am:  Nothing by mouth.   At least one hour before going to the hospital:  Give yourself one Fleet enema. You may take your morning medications with sip of water unless we have instructed otherwise.      Please see below for Dietary Information.  CLEAR LIQUIDS INCLUDE:  Water Jello (NOT red in color)   Ice Popsicles (NOT red in color)   Tea  (sugar ok, no milk/cream) Powdered fruit flavored drinks  Coffee (sugar ok, no milk/cream) Gatorade/ Lemonade/ Kool-Aid  (NOT red in color)   Juice: apple, white grape, white cranberry Soft drinks  Clear bullion, consomme, broth (fat free beef/chicken/vegetable)  Carbonated beverages (any kind)  Strained chicken noodle soup Hard Candy   Remember: Clear liquids are liquids that will allow you to see your fingers on the other side of a clear glass. Be sure liquids are NOT red in color, and not cloudy, but CLEAR.  DO NOT EAT OR DRINK ANY OF THE FOLLOWING:  Dairy products of any kind   Cranberry juice Tomato juice / V8 juice   Grapefruit juice Orange juice     Red grape juice  Do not eat any solid foods, including such foods as: cereal, oatmeal, yogurt, fruits, vegetables, creamed soups, eggs, bread, crackers, pureed foods in a blender, etc.   HELPFUL HINTS FOR DRINKING PREP SOLUTION:   Make sure prep is extremely cold. Mix and refrigerate the the morning of the prep. You may also put in the freezer.   You may try mixing some Crystal Light or Country Time Lemonade if you prefer. Mix in small amounts; add more if necessary.  Try drinking through a straw  Rinse mouth with water or a mouthwash between glasses, to remove after-taste.  Try sipping on a cold beverage /ice/ popsicles between glasses of  prep.  Place a piece of sugar-free hard candy in mouth between glasses.  If you become nauseated, try consuming smaller amounts, or stretch out the time between glasses. Stop for 30-60 minutes, then slowly start back drinking.        OTHER INSTRUCTIONS  You will need a responsible adult at least 49 years of age to accompany you and drive you home. This person must remain in the waiting room during your procedure. The hospital will cancel your procedure if you do not have a responsible adult with you.   1. Wear loose fitting clothing that is easily removed. 2. Leave jewelry and other  valuables at home.  3. Remove all body piercing jewelry and leave at home. 4. Total time from sign-in until discharge is approximately 2-3 hours. 5. You should go home directly after your procedure and rest. You can resume normal activities the day after your procedure. 6. The day of your procedure you should not:  Drive  Make legal decisions  Operate machinery  Drink alcohol  Return to work   You may call the office (Dept: [phone]) before 5:00pm, or page the doctor on call [phone]) after 5:00pm, for further instructions, if necessary.   Insurance Information YOU WILL NEED TO CHECK WITH YOUR INSURANCE COMPANY FOR THE BENEFITS OF COVERAGE YOU HAVE FOR THIS PROCEDURE.  UNFORTUNATELY, NOT ALL INSURANCE COMPANIES HAVE BENEFITS TO COVER ALL OR PART OF THESE TYPES OF PROCEDURES.  IT IS YOUR RESPONSIBILITY TO CHECK YOUR BENEFITS, HOWEVER, WE WILL BE GLAD TO ASSIST YOU WITH ANY CODES YOUR INSURANCE COMPANY MAY NEED.    PLEASE NOTE THAT MOST INSURANCE COMPANIES WILL NOT COVER A SCREENING COLONOSCOPY FOR PEOPLE UNDER THE AGE OF 50  IF YOU HAVE BCBS INSURANCE, YOU MAY HAVE BENEFITS FOR A SCREENING COLONOSCOPY BUT IF POLYPS ARE FOUND THE DIAGNOSIS WILL CHANGE AND THEN YOU MAY HAVE A DEDUCTIBLE THAT WILL NEED TO BE MET. SO PLEASE MAKE SURE YOU CHECK YOUR BENEFITS FOR A SCREENING COLONOSCOPY AS WELL AS A DIAGNOSTIC COLONOSCOPY.

## 2019-04-11 NOTE — Addendum Note (Signed)
Addended by: Metro Kung on: 04/11/2019 01:54 PM   Modules accepted: Orders, SmartSet

## 2019-04-12 ENCOUNTER — Other Ambulatory Visit (HOSPITAL_COMMUNITY): Payer: Self-pay | Admitting: Respiratory Therapy

## 2019-04-12 ENCOUNTER — Encounter: Payer: Self-pay | Admitting: Cardiology

## 2019-04-12 ENCOUNTER — Ambulatory Visit (INDEPENDENT_AMBULATORY_CARE_PROVIDER_SITE_OTHER): Payer: Medicare Other | Admitting: Cardiology

## 2019-04-12 VITALS — BP 97/64 | HR 103 | Temp 98.0°F | Ht 72.0 in | Wt 252.0 lb

## 2019-04-12 DIAGNOSIS — R079 Chest pain, unspecified: Secondary | ICD-10-CM | POA: Diagnosis not present

## 2019-04-12 DIAGNOSIS — Z01818 Encounter for other preprocedural examination: Secondary | ICD-10-CM | POA: Diagnosis not present

## 2019-04-12 DIAGNOSIS — D509 Iron deficiency anemia, unspecified: Secondary | ICD-10-CM | POA: Diagnosis not present

## 2019-04-12 DIAGNOSIS — Z992 Dependence on renal dialysis: Secondary | ICD-10-CM | POA: Diagnosis not present

## 2019-04-12 DIAGNOSIS — N2581 Secondary hyperparathyroidism of renal origin: Secondary | ICD-10-CM | POA: Diagnosis not present

## 2019-04-12 DIAGNOSIS — N186 End stage renal disease: Secondary | ICD-10-CM | POA: Diagnosis not present

## 2019-04-12 NOTE — Patient Instructions (Signed)
Medication Instructions:  Your physician recommends that you continue on your current medications as directed. Please refer to the Current Medication list given to you today.   Labwork: NONE  Testing/Procedures: Your physician has requested that you have a lexiscan myoview. For further information please visit [email or web address]. Please follow instruction sheet, as given.    Follow-Up: Your physician recommends that you schedule a follow-up appointment in: AS NEEDED    Any Other Special Instructions Will Be Listed Below (If Applicable).     If you need a refill on your cardiac medications before your next appointment, please call your pharmacy.

## 2019-04-12 NOTE — Progress Notes (Signed)
Clinical Summary Brian Barrera is a 48 y.o.male seen as new consult, referred by Dr Luan Pulling for assistance with cardiac workup for kidney transplantation.   1. Transplant evaluation - no chest pain. No sob/doe - as part of workup complete echo at [facility] with normal LVEF, no significant pathology. Attempted stress echo at that time but was not able to reach target heart rate. We are asked to help arrange a nuclear imaging stress.    2. ESRD - being considered for kidney   Past Medical History:  Diagnosis Date  . Anemia    Attributed to chronic disease/chronic kidney disease  . Chronic diastolic heart failure ([place]) 2011   EF 35% by echo in 2011; echo in 08/2011-normal EF, moderate to severe LVH; BNP level of [phone] in 2011-12  . Chronic obstructive pulmonary disease ([place])    with asthmatic component  . ESRD (end stage renal disease) on dialysis [facility])    secondary hyperparathyroidism  . Hypertension    h/o hypertensive crisis with encephalopathy  . Obesity   . Tobacco abuse    Approximate consumption of 25 pack years; continuing at 0.5 pack per day     No Known Allergies   Current Outpatient Medications  Medication Sig Dispense Refill  . albuterol (PROAIR HFA) 108 (90 BASE) MCG/ACT inhaler Inhale 2 puffs into the lungs every 4 (four) hours as needed for wheezing. 3 Inhaler 0  . amLODipine (NORVASC) 10 MG tablet TAKE 1 TABLET EVERY DAY 30 tablet 0  . aspirin 325 MG tablet Take 325 mg by mouth daily.    Lorin Picket 1 GM 210 MG(Fe) tablet Take 210 mg by mouth 3 (three) times daily with meals.     [name] b complex-vitamin c-folic acid (NEPHRO-VITE) 0.8 MG TABS Take 0.8 mg by mouth at bedtime.    . budesonide-formoterol (SYMBICORT) 80-4.5 MCG/ACT inhaler Inhale 2 puffs into the lungs 2 (two) times daily. 1 Inhaler 12  . diphenhydramine-acetaminophen (TYLENOL PM) 25-500 MG TABS tablet Take 1 tablet by mouth at bedtime as needed.    [name] HYDROcodone-acetaminophen (NORCO/VICODIN) 5-325 MG  per tablet Take 1 tablet by mouth every 6 (six) hours as needed for pain. 90 tablet 0  . SENSIPAR 30 MG tablet Take 30 mg by mouth daily with breakfast.     . sevelamer (RENVELA) 800 MG tablet Take 800 mg by mouth 3 (three) times daily with meals.     No current facility-administered medications for this visit.      Past Surgical History:  Procedure Laterality Date  . ARTERIOVENOUS GRAFT PLACEMENT  2011  . COLONOSCOPY N/A 02/04/2014   Procedure: COLONOSCOPY;  Surgeon: [name], MD;  Location: [facility];  Service: Endoscopy;  Laterality: N/A;  12:00     No Known Allergies    Family History  Problem Relation Age of Onset  . Heart disease Father   . Diabetes Sister   . Hypertension Sister   . Diabetes Sister   . Hypertension Sister   . Kidney disease Mother   . Hypertension Mother   . Hypertension Sister   . Hypertension Brother   . Hypertension Brother   . Hypertension Brother   . Colon cancer Neg Hx      Social History Mr. Hinchliffe reports that he has been smoking cigarettes. He has a 12.50 pack-year smoking history. He has never used smokeless tobacco. Mr. Dunwoody reports no history of alcohol use.   Review of Systems CONSTITUTIONAL: No weight loss, fever,  chills, weakness or fatigue.  HEENT: Eyes: No visual loss, blurred vision, double vision or yellow sclerae.No hearing loss, sneezing, congestion, runny nose or sore throat.  SKIN: No rash or itching.  CARDIOVASCULAR: per hpi RESPIRATORY: No shortness of breath, cough or sputum.  GASTROINTESTINAL: No anorexia, nausea, vomiting or diarrhea. No abdominal pain or blood.  GENITOURINARY: No burning on urination, no polyuria NEUROLOGICAL: No headache, dizziness, syncope, paralysis, ataxia, numbness or tingling in the extremities. No change in bowel or bladder control.  MUSCULOSKELETAL: No muscle, back pain, joint pain or stiffness.  LYMPHATICS: No enlarged nodes. No history of splenectomy.  PSYCHIATRIC: No  history of depression or anxiety.  ENDOCRINOLOGIC: No reports of sweating, cold or heat intolerance. No polyuria or polydipsia.  [name]   Physical Examination Vitals:   04/12/19 1547  BP: 97/64  Pulse: (!) 103  Temp: 98 F (36.7 C)  SpO2: 97%   Filed Weights   04/12/19 1547  Weight: 252 lb (114.3 kg)    Gen: resting comfortably, no acute distress HEENT: no scleral icterus, pupils equal round and reactive, no palptable cervical adenopathy,  CV: RRR, no m/r/g, no jvd Resp: Clear to auscultation bilaterally GI: abdomen is soft, non-tender, non-distended, normal bowel sounds, no hepatosplenomegaly MSK: extremities are warm, no edema.  Skin: warm, no rash Neuro:  no focal deficits Psych: appropriate affect   Diagnostic Studies  10/2018 [facility] stress echo Protocol: Treadmill (Modified Bruce)       Drugs: Beta Blockers  Target Heart Rate: 145 Maximum Predicted Heart Rate: 171    Resting ECG: Sinus rhythm   Protocol Note: LABETOLOL 200mg  BID, LAST DOSE 11/17/2018 AM    TYPE   STAGE  TIME  HR  BP  RPE SPO2 COMMENTS  -------- ----- --------- --- ------- ----- ---- ------------------------------  Baseline         90 105/ 63  BaseLine  1   6    90 105/ 63  BaseLine  2   1    88 97/ 67      STANDING BP  Stress   1  180 sec. 105 130/ 64 7/20  Stress   2  180 sec. 113 130/ 74 11/20  Stress   3  121 sec. 118 140/ 72 15/20   LEG DISCOMFORT 7/10  Recovery  1   1 min. 110 112/ 65  Recovery  2   2 min. 103 115/ 55  Recovery  3   4 min. 88 131/ 61  Recovery  4   6 min. 90 122/ 65  Recovery  5   8 min. 87 120/ 66      LEG DISCOMFORT RESOLVED    Stress Duration: 8.02 minutes. Max Stress H.R: 118  Target Heart Rate (145) Achieved: No  Max. workload of 4.60 METs was achieved during exercise.    HR Response: Attenuated secondary to medication    BP Response:  Normal resting BP - blunted response    WALL SEGMENT FINDINGS --------------------------------------------------------               Rest      Stress             --------------- ---------------  Anterior Septum Basal: Normal     Hyperkinetic          Mid: Normal     Hyperkinetic     Apical Septum: Normal     Hyperkinetic    Anterior Wall Basal: Normal     Hyperkinetic  Mid: Normal     Hyperkinetic         Apical: Normal     Hyperkinetic     Lateral Wall Basal: Normal     Hyperkinetic          Mid: Normal     Hyperkinetic         Apical: Normal     Hyperkinetic    Posterior Wall Basal: Normal     Hyperkinetic          Mid: Normal     Hyperkinetic    Inferior Wall Basal: Normal     Hyperkinetic          Mid: Normal     Hyperkinetic         Apical: Normal     Hyperkinetic    Inferior Septum Basal: Normal     Hyperkinetic          Mid: Normal     Hyperkinetic             EF: >55%      >55%      ADDITIONAL FINDINGS ----------------------------------------------------------    Chest Discomfort: None     Arrhythmia: None  Termination Reason: Leg fatigue    Complications: None    STRESS ECG RESULTS -----------------------------------------------------------    ECG Results: Nondiagnostic due to inadequate heart rate. .    INTERPRETATION ---------------------------------------------------------------    INDETERMINATE.  NO DOPPLER PERFORMED FOR VALVULAR REGURGITATION  NO DOPPLER PERFORMED FOR VALVULAR STENOSIS  Note: NO WALL MOTION ABNORMALITIES AT REST OR SUBMAXIMAL STRESS (PEAK  STRESS <118 bpm, TARGET = 145 bpm)  Maximum workload of 4.60 METs was achieved during exercise.  NORMAL RESTING BP - BLUNTED  RESPONSE   10/2018 echo INTERPRETATION ---------------------------------------------------------------  NORMAL LEFT VENTRICULAR SYSTOLIC FUNCTION WITH MODERATE LVH  NORMAL LA PRESSURES WITH NORMAL DIASTOLIC FUNCTION  NORMAL RIGHT VENTRICULAR SYSTOLIC FUNCTION  VALVULAR REGURGITATION: TRIVIAL MR, TRIVIAL TR  NO VALVULAR STENOSIS  NO PRIOR STUDY FOR COMPARISON    Assessment and Plan   1. Pretransplant evaluation - being considered for kidney transplant - no active cardiopulmonary symptoms - has completed recent echo that was overlal benign, did not reach THR on stress echo - we will arrange a lexiscan to further his transplant workup   F/u as needed  [name], M.D.

## 2019-04-14 DIAGNOSIS — N2581 Secondary hyperparathyroidism of renal origin: Secondary | ICD-10-CM | POA: Diagnosis not present

## 2019-04-14 DIAGNOSIS — N186 End stage renal disease: Secondary | ICD-10-CM | POA: Diagnosis not present

## 2019-04-14 DIAGNOSIS — D509 Iron deficiency anemia, unspecified: Secondary | ICD-10-CM | POA: Diagnosis not present

## 2019-04-14 DIAGNOSIS — Z992 Dependence on renal dialysis: Secondary | ICD-10-CM | POA: Diagnosis not present

## 2019-04-17 DIAGNOSIS — D509 Iron deficiency anemia, unspecified: Secondary | ICD-10-CM | POA: Diagnosis not present

## 2019-04-17 DIAGNOSIS — N186 End stage renal disease: Secondary | ICD-10-CM | POA: Diagnosis not present

## 2019-04-17 DIAGNOSIS — N2581 Secondary hyperparathyroidism of renal origin: Secondary | ICD-10-CM | POA: Diagnosis not present

## 2019-04-17 DIAGNOSIS — Z992 Dependence on renal dialysis: Secondary | ICD-10-CM | POA: Diagnosis not present

## 2019-04-18 DIAGNOSIS — Z992 Dependence on renal dialysis: Secondary | ICD-10-CM | POA: Diagnosis not present

## 2019-04-18 DIAGNOSIS — N2581 Secondary hyperparathyroidism of renal origin: Secondary | ICD-10-CM | POA: Diagnosis not present

## 2019-04-18 DIAGNOSIS — D509 Iron deficiency anemia, unspecified: Secondary | ICD-10-CM | POA: Diagnosis not present

## 2019-04-18 DIAGNOSIS — N186 End stage renal disease: Secondary | ICD-10-CM | POA: Diagnosis not present

## 2019-04-21 DIAGNOSIS — Z992 Dependence on renal dialysis: Secondary | ICD-10-CM | POA: Diagnosis not present

## 2019-04-21 DIAGNOSIS — D509 Iron deficiency anemia, unspecified: Secondary | ICD-10-CM | POA: Diagnosis not present

## 2019-04-21 DIAGNOSIS — N2581 Secondary hyperparathyroidism of renal origin: Secondary | ICD-10-CM | POA: Diagnosis not present

## 2019-04-21 DIAGNOSIS — N186 End stage renal disease: Secondary | ICD-10-CM | POA: Diagnosis not present

## 2019-04-23 ENCOUNTER — Other Ambulatory Visit: Payer: Self-pay

## 2019-04-23 ENCOUNTER — Telehealth: Payer: Self-pay | Admitting: *Deleted

## 2019-04-23 ENCOUNTER — Encounter (HOSPITAL_BASED_OUTPATIENT_CLINIC_OR_DEPARTMENT_OTHER)
Admission: RE | Admit: 2019-04-23 | Discharge: 2019-04-23 | Disposition: A | Discharge status: Home / Self Care | Payer: Medicare Other | Source: Ambulatory Visit | Attending: Cardiology | Admitting: Cardiology

## 2019-04-23 ENCOUNTER — Encounter (HOSPITAL_COMMUNITY)
Admission: RE | Admit: 2019-04-23 | Discharge: 2019-04-23 | Disposition: A | Discharge status: Home / Self Care | Payer: Medicare Other | Source: Ambulatory Visit | Attending: Cardiology | Admitting: Cardiology

## 2019-04-23 DIAGNOSIS — I132 Hypertensive heart and chronic kidney disease with heart failure and with stage 5 chronic kidney disease, or end stage renal disease: Secondary | ICD-10-CM | POA: Diagnosis not present

## 2019-04-23 DIAGNOSIS — I5032 Chronic diastolic (congestive) heart failure: Secondary | ICD-10-CM | POA: Diagnosis not present

## 2019-04-23 DIAGNOSIS — Z79899 Other long term (current) drug therapy: Secondary | ICD-10-CM | POA: Diagnosis not present

## 2019-04-23 DIAGNOSIS — N186 End stage renal disease: Secondary | ICD-10-CM | POA: Insufficient documentation

## 2019-04-23 DIAGNOSIS — Z7982 Long term (current) use of aspirin: Secondary | ICD-10-CM | POA: Insufficient documentation

## 2019-04-23 DIAGNOSIS — Z992 Dependence on renal dialysis: Secondary | ICD-10-CM | POA: Insufficient documentation

## 2019-04-23 DIAGNOSIS — Z01818 Encounter for other preprocedural examination: Secondary | ICD-10-CM | POA: Diagnosis not present

## 2019-04-23 DIAGNOSIS — F17210 Nicotine dependence, cigarettes, uncomplicated: Secondary | ICD-10-CM | POA: Diagnosis not present

## 2019-04-23 DIAGNOSIS — Z01810 Encounter for preprocedural cardiovascular examination: Secondary | ICD-10-CM | POA: Diagnosis not present

## 2019-04-23 LAB — NM MYOCAR MULTI W/SPECT W/WALL MOTION / EF
LV dias vol: 125 mL (ref 62–150)
LV sys vol: 59 mL
Peak HR: 100 {beats}/min
RATE: 0.32
Rest HR: 76 {beats}/min
SDS: 2
SRS: 1
SSS: 3
TID: 1

## 2019-04-23 MED ORDER — TECHNETIUM TC 99M TETROFOSMIN IV KIT
30.0000 | PACK | Freq: Once | INTRAVENOUS | Status: AC | PRN
  Administered 2019-04-23: 30 via INTRAVENOUS

## 2019-04-23 MED ORDER — SODIUM CHLORIDE FLUSH 0.9 % IV SOLN
INTRAVENOUS | Status: AC
  Administered 2019-04-23: 10 mL via INTRAVENOUS
  Filled 2019-04-23: qty 10

## 2019-04-23 MED ORDER — REGADENOSON 0.4 MG/5ML IV SOLN
INTRAVENOUS | Status: AC
  Administered 2019-04-23: 0.4 mg via INTRAVENOUS
  Filled 2019-04-23: qty 5

## 2019-04-23 MED ORDER — TECHNETIUM TC 99M TETROFOSMIN IV KIT
10.0000 | PACK | Freq: Once | INTRAVENOUS | Status: AC | PRN
  Administered 2019-04-23: 10 via INTRAVENOUS

## 2019-04-23 NOTE — Telephone Encounter (Signed)
-----   Message from Herminio Commons, MD sent at 04/23/2019  3:34 PM EST ----- Low risk for significant blockages.

## 2019-04-23 NOTE — Telephone Encounter (Signed)
Called patient with test results. No answer. Left message to call back.  

## 2019-04-24 DIAGNOSIS — N2581 Secondary hyperparathyroidism of renal origin: Secondary | ICD-10-CM | POA: Diagnosis not present

## 2019-04-24 DIAGNOSIS — N186 End stage renal disease: Secondary | ICD-10-CM | POA: Diagnosis not present

## 2019-04-24 DIAGNOSIS — Z992 Dependence on renal dialysis: Secondary | ICD-10-CM | POA: Diagnosis not present

## 2019-04-24 DIAGNOSIS — D509 Iron deficiency anemia, unspecified: Secondary | ICD-10-CM | POA: Diagnosis not present

## 2019-04-24 DIAGNOSIS — D631 Anemia in chronic kidney disease: Secondary | ICD-10-CM | POA: Diagnosis not present

## 2019-04-26 DIAGNOSIS — Z992 Dependence on renal dialysis: Secondary | ICD-10-CM | POA: Diagnosis not present

## 2019-04-26 DIAGNOSIS — D631 Anemia in chronic kidney disease: Secondary | ICD-10-CM | POA: Diagnosis not present

## 2019-04-26 DIAGNOSIS — D509 Iron deficiency anemia, unspecified: Secondary | ICD-10-CM | POA: Diagnosis not present

## 2019-04-26 DIAGNOSIS — N186 End stage renal disease: Secondary | ICD-10-CM | POA: Diagnosis not present

## 2019-04-26 DIAGNOSIS — N2581 Secondary hyperparathyroidism of renal origin: Secondary | ICD-10-CM | POA: Diagnosis not present

## 2019-04-28 DIAGNOSIS — Z992 Dependence on renal dialysis: Secondary | ICD-10-CM | POA: Diagnosis not present

## 2019-04-28 DIAGNOSIS — N2581 Secondary hyperparathyroidism of renal origin: Secondary | ICD-10-CM | POA: Diagnosis not present

## 2019-04-28 DIAGNOSIS — D509 Iron deficiency anemia, unspecified: Secondary | ICD-10-CM | POA: Diagnosis not present

## 2019-04-28 DIAGNOSIS — D631 Anemia in chronic kidney disease: Secondary | ICD-10-CM | POA: Diagnosis not present

## 2019-04-28 DIAGNOSIS — N186 End stage renal disease: Secondary | ICD-10-CM | POA: Diagnosis not present

## 2019-05-01 DIAGNOSIS — N186 End stage renal disease: Secondary | ICD-10-CM | POA: Diagnosis not present

## 2019-05-01 DIAGNOSIS — N2581 Secondary hyperparathyroidism of renal origin: Secondary | ICD-10-CM | POA: Diagnosis not present

## 2019-05-01 DIAGNOSIS — D509 Iron deficiency anemia, unspecified: Secondary | ICD-10-CM | POA: Diagnosis not present

## 2019-05-01 DIAGNOSIS — Z992 Dependence on renal dialysis: Secondary | ICD-10-CM | POA: Diagnosis not present

## 2019-05-01 DIAGNOSIS — D631 Anemia in chronic kidney disease: Secondary | ICD-10-CM | POA: Diagnosis not present

## 2019-05-03 DIAGNOSIS — Z992 Dependence on renal dialysis: Secondary | ICD-10-CM | POA: Diagnosis not present

## 2019-05-03 DIAGNOSIS — D509 Iron deficiency anemia, unspecified: Secondary | ICD-10-CM | POA: Diagnosis not present

## 2019-05-03 DIAGNOSIS — D631 Anemia in chronic kidney disease: Secondary | ICD-10-CM | POA: Diagnosis not present

## 2019-05-03 DIAGNOSIS — N2581 Secondary hyperparathyroidism of renal origin: Secondary | ICD-10-CM | POA: Diagnosis not present

## 2019-05-03 DIAGNOSIS — N186 End stage renal disease: Secondary | ICD-10-CM | POA: Diagnosis not present

## 2019-05-05 DIAGNOSIS — D631 Anemia in chronic kidney disease: Secondary | ICD-10-CM | POA: Diagnosis not present

## 2019-05-05 DIAGNOSIS — D509 Iron deficiency anemia, unspecified: Secondary | ICD-10-CM | POA: Diagnosis not present

## 2019-05-05 DIAGNOSIS — N2581 Secondary hyperparathyroidism of renal origin: Secondary | ICD-10-CM | POA: Diagnosis not present

## 2019-05-05 DIAGNOSIS — N186 End stage renal disease: Secondary | ICD-10-CM | POA: Diagnosis not present

## 2019-05-05 DIAGNOSIS — Z992 Dependence on renal dialysis: Secondary | ICD-10-CM | POA: Diagnosis not present

## 2019-05-08 DIAGNOSIS — D631 Anemia in chronic kidney disease: Secondary | ICD-10-CM | POA: Diagnosis not present

## 2019-05-08 DIAGNOSIS — N2581 Secondary hyperparathyroidism of renal origin: Secondary | ICD-10-CM | POA: Diagnosis not present

## 2019-05-08 DIAGNOSIS — Z992 Dependence on renal dialysis: Secondary | ICD-10-CM | POA: Diagnosis not present

## 2019-05-08 DIAGNOSIS — D509 Iron deficiency anemia, unspecified: Secondary | ICD-10-CM | POA: Diagnosis not present

## 2019-05-08 DIAGNOSIS — N186 End stage renal disease: Secondary | ICD-10-CM | POA: Diagnosis not present

## 2019-05-10 DIAGNOSIS — N186 End stage renal disease: Secondary | ICD-10-CM | POA: Diagnosis not present

## 2019-05-10 DIAGNOSIS — N2581 Secondary hyperparathyroidism of renal origin: Secondary | ICD-10-CM | POA: Diagnosis not present

## 2019-05-10 DIAGNOSIS — J449 Chronic obstructive pulmonary disease, unspecified: Secondary | ICD-10-CM | POA: Diagnosis not present

## 2019-05-10 DIAGNOSIS — D509 Iron deficiency anemia, unspecified: Secondary | ICD-10-CM | POA: Diagnosis not present

## 2019-05-10 DIAGNOSIS — D631 Anemia in chronic kidney disease: Secondary | ICD-10-CM | POA: Diagnosis not present

## 2019-05-10 DIAGNOSIS — Z992 Dependence on renal dialysis: Secondary | ICD-10-CM | POA: Diagnosis not present

## 2019-05-10 DIAGNOSIS — M545 Low back pain: Secondary | ICD-10-CM | POA: Diagnosis not present

## 2019-05-10 DIAGNOSIS — I129 Hypertensive chronic kidney disease with stage 1 through stage 4 chronic kidney disease, or unspecified chronic kidney disease: Secondary | ICD-10-CM | POA: Diagnosis not present

## 2019-05-11 DIAGNOSIS — Z992 Dependence on renal dialysis: Secondary | ICD-10-CM | POA: Diagnosis not present

## 2019-05-11 DIAGNOSIS — D509 Iron deficiency anemia, unspecified: Secondary | ICD-10-CM | POA: Diagnosis not present

## 2019-05-11 DIAGNOSIS — N2581 Secondary hyperparathyroidism of renal origin: Secondary | ICD-10-CM | POA: Diagnosis not present

## 2019-05-11 DIAGNOSIS — D631 Anemia in chronic kidney disease: Secondary | ICD-10-CM | POA: Diagnosis not present

## 2019-05-11 DIAGNOSIS — N186 End stage renal disease: Secondary | ICD-10-CM | POA: Diagnosis not present

## 2019-05-12 DIAGNOSIS — Z992 Dependence on renal dialysis: Secondary | ICD-10-CM | POA: Diagnosis not present

## 2019-05-12 DIAGNOSIS — N186 End stage renal disease: Secondary | ICD-10-CM | POA: Diagnosis not present

## 2019-05-12 DIAGNOSIS — D509 Iron deficiency anemia, unspecified: Secondary | ICD-10-CM | POA: Diagnosis not present

## 2019-05-12 DIAGNOSIS — N2581 Secondary hyperparathyroidism of renal origin: Secondary | ICD-10-CM | POA: Diagnosis not present

## 2019-05-12 DIAGNOSIS — D631 Anemia in chronic kidney disease: Secondary | ICD-10-CM | POA: Diagnosis not present

## 2019-05-15 DIAGNOSIS — N186 End stage renal disease: Secondary | ICD-10-CM | POA: Diagnosis not present

## 2019-05-15 DIAGNOSIS — D509 Iron deficiency anemia, unspecified: Secondary | ICD-10-CM | POA: Diagnosis not present

## 2019-05-15 DIAGNOSIS — N2581 Secondary hyperparathyroidism of renal origin: Secondary | ICD-10-CM | POA: Diagnosis not present

## 2019-05-15 DIAGNOSIS — Z992 Dependence on renal dialysis: Secondary | ICD-10-CM | POA: Diagnosis not present

## 2019-05-15 DIAGNOSIS — D631 Anemia in chronic kidney disease: Secondary | ICD-10-CM | POA: Diagnosis not present

## 2019-05-17 DIAGNOSIS — D631 Anemia in chronic kidney disease: Secondary | ICD-10-CM | POA: Diagnosis not present

## 2019-05-17 DIAGNOSIS — N2581 Secondary hyperparathyroidism of renal origin: Secondary | ICD-10-CM | POA: Diagnosis not present

## 2019-05-17 DIAGNOSIS — D509 Iron deficiency anemia, unspecified: Secondary | ICD-10-CM | POA: Diagnosis not present

## 2019-05-17 DIAGNOSIS — Z992 Dependence on renal dialysis: Secondary | ICD-10-CM | POA: Diagnosis not present

## 2019-05-17 DIAGNOSIS — N186 End stage renal disease: Secondary | ICD-10-CM | POA: Diagnosis not present

## 2019-05-20 DIAGNOSIS — D631 Anemia in chronic kidney disease: Secondary | ICD-10-CM | POA: Diagnosis not present

## 2019-05-20 DIAGNOSIS — D509 Iron deficiency anemia, unspecified: Secondary | ICD-10-CM | POA: Diagnosis not present

## 2019-05-20 DIAGNOSIS — N2581 Secondary hyperparathyroidism of renal origin: Secondary | ICD-10-CM | POA: Diagnosis not present

## 2019-05-20 DIAGNOSIS — Z992 Dependence on renal dialysis: Secondary | ICD-10-CM | POA: Diagnosis not present

## 2019-05-20 DIAGNOSIS — N186 End stage renal disease: Secondary | ICD-10-CM | POA: Diagnosis not present

## 2019-05-22 DIAGNOSIS — D631 Anemia in chronic kidney disease: Secondary | ICD-10-CM | POA: Diagnosis not present

## 2019-05-22 DIAGNOSIS — Z992 Dependence on renal dialysis: Secondary | ICD-10-CM | POA: Diagnosis not present

## 2019-05-22 DIAGNOSIS — N186 End stage renal disease: Secondary | ICD-10-CM | POA: Diagnosis not present

## 2019-05-22 DIAGNOSIS — D509 Iron deficiency anemia, unspecified: Secondary | ICD-10-CM | POA: Diagnosis not present

## 2019-05-22 DIAGNOSIS — N2581 Secondary hyperparathyroidism of renal origin: Secondary | ICD-10-CM | POA: Diagnosis not present

## 2019-05-24 DIAGNOSIS — N2581 Secondary hyperparathyroidism of renal origin: Secondary | ICD-10-CM | POA: Diagnosis not present

## 2019-05-24 DIAGNOSIS — N186 End stage renal disease: Secondary | ICD-10-CM | POA: Diagnosis not present

## 2019-05-24 DIAGNOSIS — D509 Iron deficiency anemia, unspecified: Secondary | ICD-10-CM | POA: Diagnosis not present

## 2019-05-24 DIAGNOSIS — Z992 Dependence on renal dialysis: Secondary | ICD-10-CM | POA: Diagnosis not present

## 2019-05-24 DIAGNOSIS — D631 Anemia in chronic kidney disease: Secondary | ICD-10-CM | POA: Diagnosis not present

## 2019-05-25 DIAGNOSIS — Z992 Dependence on renal dialysis: Secondary | ICD-10-CM | POA: Diagnosis not present

## 2019-05-25 DIAGNOSIS — D509 Iron deficiency anemia, unspecified: Secondary | ICD-10-CM | POA: Diagnosis not present

## 2019-05-25 DIAGNOSIS — N2581 Secondary hyperparathyroidism of renal origin: Secondary | ICD-10-CM | POA: Diagnosis not present

## 2019-05-25 DIAGNOSIS — N186 End stage renal disease: Secondary | ICD-10-CM | POA: Diagnosis not present

## 2019-05-26 DIAGNOSIS — D509 Iron deficiency anemia, unspecified: Secondary | ICD-10-CM | POA: Diagnosis not present

## 2019-05-26 DIAGNOSIS — N2581 Secondary hyperparathyroidism of renal origin: Secondary | ICD-10-CM | POA: Diagnosis not present

## 2019-05-26 DIAGNOSIS — Z992 Dependence on renal dialysis: Secondary | ICD-10-CM | POA: Diagnosis not present

## 2019-05-26 DIAGNOSIS — N186 End stage renal disease: Secondary | ICD-10-CM | POA: Diagnosis not present

## 2019-05-28 ENCOUNTER — Other Ambulatory Visit: Payer: Self-pay

## 2019-05-28 ENCOUNTER — Other Ambulatory Visit (HOSPITAL_COMMUNITY)
Admission: RE | Admit: 2019-05-28 | Discharge: 2019-05-28 | Disposition: A | Discharge status: Home / Self Care | Payer: Medicare Other | Source: Ambulatory Visit | Attending: Pulmonary Disease | Admitting: Pulmonary Disease

## 2019-05-28 DIAGNOSIS — Z20822 Contact with and (suspected) exposure to covid-19: Secondary | ICD-10-CM | POA: Insufficient documentation

## 2019-05-28 LAB — SARS CORONAVIRUS 2 (TAT 6-24 HRS): SARS Coronavirus 2: NEGATIVE

## 2019-05-29 DIAGNOSIS — N186 End stage renal disease: Secondary | ICD-10-CM | POA: Diagnosis not present

## 2019-05-29 DIAGNOSIS — N2581 Secondary hyperparathyroidism of renal origin: Secondary | ICD-10-CM | POA: Diagnosis not present

## 2019-05-29 DIAGNOSIS — D509 Iron deficiency anemia, unspecified: Secondary | ICD-10-CM | POA: Diagnosis not present

## 2019-05-29 DIAGNOSIS — Z992 Dependence on renal dialysis: Secondary | ICD-10-CM | POA: Diagnosis not present

## 2019-05-31 ENCOUNTER — Ambulatory Visit (HOSPITAL_COMMUNITY)
Admission: RE | Admit: 2019-05-31 | Discharge: 2019-05-31 | Disposition: A | Discharge status: Home / Self Care | Payer: Medicare Other | Source: Ambulatory Visit | Attending: Pulmonary Disease | Admitting: Pulmonary Disease

## 2019-05-31 ENCOUNTER — Other Ambulatory Visit: Payer: Self-pay

## 2019-05-31 DIAGNOSIS — Z992 Dependence on renal dialysis: Secondary | ICD-10-CM | POA: Diagnosis not present

## 2019-05-31 DIAGNOSIS — N186 End stage renal disease: Secondary | ICD-10-CM | POA: Diagnosis not present

## 2019-05-31 DIAGNOSIS — J441 Chronic obstructive pulmonary disease with (acute) exacerbation: Secondary | ICD-10-CM | POA: Insufficient documentation

## 2019-05-31 DIAGNOSIS — N2581 Secondary hyperparathyroidism of renal origin: Secondary | ICD-10-CM | POA: Diagnosis not present

## 2019-05-31 DIAGNOSIS — D509 Iron deficiency anemia, unspecified: Secondary | ICD-10-CM | POA: Diagnosis not present

## 2019-05-31 LAB — PULMONARY FUNCTION TEST
DL/VA % pred: 99 %
DL/VA: 4.38 ml/min/mmHg/L
DLCO unc % pred: 93 %
DLCO unc: 29.23 ml/min/mmHg
FEF 25-75 Post: 2.63 L/sec
FEF 25-75 Pre: 1.97 L/sec
FEF2575-%Change-Post: 33 %
FEF2575-%Pred-Post: 73 %
FEF2575-%Pred-Pre: 55 %
FEV1-%Change-Post: 8 %
FEV1-%Pred-Post: 83 %
FEV1-%Pred-Pre: 76 %
FEV1-Post: 3.02 L
FEV1-Pre: 2.78 L
FEV1FVC-%Change-Post: -2 %
FEV1FVC-%Pred-Pre: 87 %
FEV6-%Change-Post: 11 %
FEV6-%Pred-Post: 99 %
FEV6-%Pred-Pre: 88 %
FEV6-Post: 4.36 L
FEV6-Pre: 3.91 L
FEV6FVC-%Change-Post: 0 %
FEV6FVC-%Pred-Post: 101 %
FEV6FVC-%Pred-Pre: 101 %
FVC-%Change-Post: 10 %
FVC-%Pred-Post: 97 %
FVC-%Pred-Pre: 87 %
FVC-Post: 4.4 L
FVC-Pre: 3.97 L
Post FEV1/FVC ratio: 69 %
Post FEV6/FVC ratio: 99 %
Pre FEV1/FVC ratio: 70 %
Pre FEV6/FVC Ratio: 99 %
RV % pred: 179 %
RV: 3.8 L
TLC % pred: 105 %
TLC: 7.76 L

## 2019-05-31 MED ORDER — ALBUTEROL SULFATE (2.5 MG/3ML) 0.083% IN NEBU
2.5000 mg | INHALATION_SOLUTION | Freq: Once | RESPIRATORY_TRACT | Status: AC
  Administered 2019-05-31: 2.5 mg via RESPIRATORY_TRACT

## 2019-06-02 DIAGNOSIS — Z992 Dependence on renal dialysis: Secondary | ICD-10-CM | POA: Diagnosis not present

## 2019-06-02 DIAGNOSIS — N186 End stage renal disease: Secondary | ICD-10-CM | POA: Diagnosis not present

## 2019-06-02 DIAGNOSIS — N2581 Secondary hyperparathyroidism of renal origin: Secondary | ICD-10-CM | POA: Diagnosis not present

## 2019-06-02 DIAGNOSIS — D509 Iron deficiency anemia, unspecified: Secondary | ICD-10-CM | POA: Diagnosis not present

## 2019-06-05 DIAGNOSIS — D509 Iron deficiency anemia, unspecified: Secondary | ICD-10-CM | POA: Diagnosis not present

## 2019-06-05 DIAGNOSIS — Z992 Dependence on renal dialysis: Secondary | ICD-10-CM | POA: Diagnosis not present

## 2019-06-05 DIAGNOSIS — N186 End stage renal disease: Secondary | ICD-10-CM | POA: Diagnosis not present

## 2019-06-05 DIAGNOSIS — N2581 Secondary hyperparathyroidism of renal origin: Secondary | ICD-10-CM | POA: Diagnosis not present

## 2019-06-07 DIAGNOSIS — Z992 Dependence on renal dialysis: Secondary | ICD-10-CM | POA: Diagnosis not present

## 2019-06-07 DIAGNOSIS — N186 End stage renal disease: Secondary | ICD-10-CM | POA: Diagnosis not present

## 2019-06-07 DIAGNOSIS — D509 Iron deficiency anemia, unspecified: Secondary | ICD-10-CM | POA: Diagnosis not present

## 2019-06-07 DIAGNOSIS — N2581 Secondary hyperparathyroidism of renal origin: Secondary | ICD-10-CM | POA: Diagnosis not present

## 2019-06-09 DIAGNOSIS — N186 End stage renal disease: Secondary | ICD-10-CM | POA: Diagnosis not present

## 2019-06-09 DIAGNOSIS — D509 Iron deficiency anemia, unspecified: Secondary | ICD-10-CM | POA: Diagnosis not present

## 2019-06-09 DIAGNOSIS — N2581 Secondary hyperparathyroidism of renal origin: Secondary | ICD-10-CM | POA: Diagnosis not present

## 2019-06-09 DIAGNOSIS — Z992 Dependence on renal dialysis: Secondary | ICD-10-CM | POA: Diagnosis not present

## 2019-06-10 DIAGNOSIS — Z992 Dependence on renal dialysis: Secondary | ICD-10-CM | POA: Diagnosis not present

## 2019-06-10 DIAGNOSIS — N186 End stage renal disease: Secondary | ICD-10-CM | POA: Diagnosis not present

## 2019-06-10 DIAGNOSIS — D509 Iron deficiency anemia, unspecified: Secondary | ICD-10-CM | POA: Diagnosis not present

## 2019-06-10 DIAGNOSIS — N2581 Secondary hyperparathyroidism of renal origin: Secondary | ICD-10-CM | POA: Diagnosis not present

## 2019-06-12 DIAGNOSIS — N186 End stage renal disease: Secondary | ICD-10-CM | POA: Diagnosis not present

## 2019-06-12 DIAGNOSIS — D509 Iron deficiency anemia, unspecified: Secondary | ICD-10-CM | POA: Diagnosis not present

## 2019-06-12 DIAGNOSIS — N2581 Secondary hyperparathyroidism of renal origin: Secondary | ICD-10-CM | POA: Diagnosis not present

## 2019-06-12 DIAGNOSIS — Z992 Dependence on renal dialysis: Secondary | ICD-10-CM | POA: Diagnosis not present

## 2019-06-14 DIAGNOSIS — N2581 Secondary hyperparathyroidism of renal origin: Secondary | ICD-10-CM | POA: Diagnosis not present

## 2019-06-14 DIAGNOSIS — Z992 Dependence on renal dialysis: Secondary | ICD-10-CM | POA: Diagnosis not present

## 2019-06-14 DIAGNOSIS — D509 Iron deficiency anemia, unspecified: Secondary | ICD-10-CM | POA: Diagnosis not present

## 2019-06-14 DIAGNOSIS — N186 End stage renal disease: Secondary | ICD-10-CM | POA: Diagnosis not present

## 2019-06-16 DIAGNOSIS — N186 End stage renal disease: Secondary | ICD-10-CM | POA: Diagnosis not present

## 2019-06-16 DIAGNOSIS — N2581 Secondary hyperparathyroidism of renal origin: Secondary | ICD-10-CM | POA: Diagnosis not present

## 2019-06-16 DIAGNOSIS — D509 Iron deficiency anemia, unspecified: Secondary | ICD-10-CM | POA: Diagnosis not present

## 2019-06-16 DIAGNOSIS — Z992 Dependence on renal dialysis: Secondary | ICD-10-CM | POA: Diagnosis not present

## 2019-06-19 DIAGNOSIS — Z992 Dependence on renal dialysis: Secondary | ICD-10-CM | POA: Diagnosis not present

## 2019-06-19 DIAGNOSIS — D509 Iron deficiency anemia, unspecified: Secondary | ICD-10-CM | POA: Diagnosis not present

## 2019-06-19 DIAGNOSIS — N186 End stage renal disease: Secondary | ICD-10-CM | POA: Diagnosis not present

## 2019-06-19 DIAGNOSIS — N2581 Secondary hyperparathyroidism of renal origin: Secondary | ICD-10-CM | POA: Diagnosis not present

## 2019-06-21 DIAGNOSIS — N186 End stage renal disease: Secondary | ICD-10-CM | POA: Diagnosis not present

## 2019-06-21 DIAGNOSIS — Z992 Dependence on renal dialysis: Secondary | ICD-10-CM | POA: Diagnosis not present

## 2019-06-21 DIAGNOSIS — N2581 Secondary hyperparathyroidism of renal origin: Secondary | ICD-10-CM | POA: Diagnosis not present

## 2019-06-21 DIAGNOSIS — D509 Iron deficiency anemia, unspecified: Secondary | ICD-10-CM | POA: Diagnosis not present

## 2019-06-23 DIAGNOSIS — D509 Iron deficiency anemia, unspecified: Secondary | ICD-10-CM | POA: Diagnosis not present

## 2019-06-23 DIAGNOSIS — N186 End stage renal disease: Secondary | ICD-10-CM | POA: Diagnosis not present

## 2019-06-23 DIAGNOSIS — Z992 Dependence on renal dialysis: Secondary | ICD-10-CM | POA: Diagnosis not present

## 2019-06-23 DIAGNOSIS — N2581 Secondary hyperparathyroidism of renal origin: Secondary | ICD-10-CM | POA: Diagnosis not present

## 2019-06-24 DIAGNOSIS — N186 End stage renal disease: Secondary | ICD-10-CM | POA: Diagnosis not present

## 2019-06-24 DIAGNOSIS — Z992 Dependence on renal dialysis: Secondary | ICD-10-CM | POA: Diagnosis not present

## 2019-06-25 ENCOUNTER — Telehealth: Payer: Self-pay | Admitting: Internal Medicine

## 2019-06-25 DIAGNOSIS — N186 End stage renal disease: Secondary | ICD-10-CM | POA: Diagnosis not present

## 2019-06-25 DIAGNOSIS — Z992 Dependence on renal dialysis: Secondary | ICD-10-CM | POA: Diagnosis not present

## 2019-06-25 DIAGNOSIS — D509 Iron deficiency anemia, unspecified: Secondary | ICD-10-CM | POA: Diagnosis not present

## 2019-06-25 DIAGNOSIS — N2581 Secondary hyperparathyroidism of renal origin: Secondary | ICD-10-CM | POA: Diagnosis not present

## 2019-06-25 NOTE — Telephone Encounter (Signed)
Routing to triage nurse.

## 2019-06-25 NOTE — Telephone Encounter (Signed)
Pt is scheduled procedure on 07/04/2019 and has dialysis on the same day. He said he could still be at the hospital at 1100 for procedure, but was it ok for him to still have dialysis? Please advise. [phone]

## 2019-06-25 NOTE — Telephone Encounter (Signed)
Spoke with pt and rescheduled his procedure to 07/03/2019.  Pt is aware that I am faxing his prep instructions and Covid screening information to [place].  Pt voiced understanding.

## 2019-06-26 DIAGNOSIS — N186 End stage renal disease: Secondary | ICD-10-CM | POA: Diagnosis not present

## 2019-06-26 DIAGNOSIS — Z992 Dependence on renal dialysis: Secondary | ICD-10-CM | POA: Diagnosis not present

## 2019-06-26 DIAGNOSIS — N2581 Secondary hyperparathyroidism of renal origin: Secondary | ICD-10-CM | POA: Diagnosis not present

## 2019-06-26 DIAGNOSIS — D509 Iron deficiency anemia, unspecified: Secondary | ICD-10-CM | POA: Diagnosis not present

## 2019-06-28 DIAGNOSIS — N2581 Secondary hyperparathyroidism of renal origin: Secondary | ICD-10-CM | POA: Diagnosis not present

## 2019-06-28 DIAGNOSIS — D509 Iron deficiency anemia, unspecified: Secondary | ICD-10-CM | POA: Diagnosis not present

## 2019-06-28 DIAGNOSIS — N186 End stage renal disease: Secondary | ICD-10-CM | POA: Diagnosis not present

## 2019-06-28 DIAGNOSIS — Z992 Dependence on renal dialysis: Secondary | ICD-10-CM | POA: Diagnosis not present

## 2019-06-30 DIAGNOSIS — N186 End stage renal disease: Secondary | ICD-10-CM | POA: Diagnosis not present

## 2019-06-30 DIAGNOSIS — D509 Iron deficiency anemia, unspecified: Secondary | ICD-10-CM | POA: Diagnosis not present

## 2019-06-30 DIAGNOSIS — Z992 Dependence on renal dialysis: Secondary | ICD-10-CM | POA: Diagnosis not present

## 2019-06-30 DIAGNOSIS — N2581 Secondary hyperparathyroidism of renal origin: Secondary | ICD-10-CM | POA: Diagnosis not present

## 2019-07-02 ENCOUNTER — Other Ambulatory Visit: Payer: Self-pay

## 2019-07-02 ENCOUNTER — Other Ambulatory Visit (HOSPITAL_COMMUNITY)
Admission: RE | Admit: 2019-07-02 | Discharge: 2019-07-02 | Disposition: A | Discharge status: Home / Self Care | Payer: Medicare Other | Source: Ambulatory Visit | Attending: Internal Medicine | Admitting: Internal Medicine

## 2019-07-02 DIAGNOSIS — Z01812 Encounter for preprocedural laboratory examination: Secondary | ICD-10-CM | POA: Diagnosis not present

## 2019-07-02 DIAGNOSIS — N186 End stage renal disease: Secondary | ICD-10-CM | POA: Diagnosis not present

## 2019-07-02 DIAGNOSIS — Z992 Dependence on renal dialysis: Secondary | ICD-10-CM | POA: Diagnosis not present

## 2019-07-02 DIAGNOSIS — N2581 Secondary hyperparathyroidism of renal origin: Secondary | ICD-10-CM | POA: Diagnosis not present

## 2019-07-02 DIAGNOSIS — Z20822 Contact with and (suspected) exposure to covid-19: Secondary | ICD-10-CM | POA: Diagnosis not present

## 2019-07-02 DIAGNOSIS — D509 Iron deficiency anemia, unspecified: Secondary | ICD-10-CM | POA: Diagnosis not present

## 2019-07-02 LAB — SARS CORONAVIRUS 2 (TAT 6-24 HRS): SARS Coronavirus 2: NEGATIVE

## 2019-07-03 ENCOUNTER — Other Ambulatory Visit: Payer: Self-pay

## 2019-07-03 ENCOUNTER — Encounter (HOSPITAL_COMMUNITY)
Admission: RE | Disposition: A | Discharge status: Home / Self Care | Payer: Self-pay | Source: Home / Self Care | Attending: Internal Medicine

## 2019-07-03 ENCOUNTER — Other Ambulatory Visit (HOSPITAL_COMMUNITY): Payer: Medicare Other

## 2019-07-03 ENCOUNTER — Encounter (HOSPITAL_COMMUNITY): Payer: Self-pay | Admitting: Internal Medicine

## 2019-07-03 ENCOUNTER — Ambulatory Visit (HOSPITAL_COMMUNITY)
Admission: RE | Admit: 2019-07-03 | Discharge: 2019-07-03 | Disposition: A | Discharge status: Home / Self Care | Payer: Medicare Other | Attending: Internal Medicine | Admitting: Internal Medicine

## 2019-07-03 DIAGNOSIS — Z1211 Encounter for screening for malignant neoplasm of colon: Secondary | ICD-10-CM | POA: Insufficient documentation

## 2019-07-03 DIAGNOSIS — D124 Benign neoplasm of descending colon: Secondary | ICD-10-CM | POA: Diagnosis not present

## 2019-07-03 DIAGNOSIS — Z992 Dependence on renal dialysis: Secondary | ICD-10-CM | POA: Diagnosis not present

## 2019-07-03 DIAGNOSIS — Z7951 Long term (current) use of inhaled steroids: Secondary | ICD-10-CM | POA: Diagnosis not present

## 2019-07-03 DIAGNOSIS — E1122 Type 2 diabetes mellitus with diabetic chronic kidney disease: Secondary | ICD-10-CM | POA: Insufficient documentation

## 2019-07-03 DIAGNOSIS — N2581 Secondary hyperparathyroidism of renal origin: Secondary | ICD-10-CM | POA: Insufficient documentation

## 2019-07-03 DIAGNOSIS — Z7982 Long term (current) use of aspirin: Secondary | ICD-10-CM | POA: Diagnosis not present

## 2019-07-03 DIAGNOSIS — Z86010 Personal history of colonic polyps: Secondary | ICD-10-CM | POA: Diagnosis not present

## 2019-07-03 DIAGNOSIS — D123 Benign neoplasm of transverse colon: Secondary | ICD-10-CM | POA: Diagnosis not present

## 2019-07-03 DIAGNOSIS — F17210 Nicotine dependence, cigarettes, uncomplicated: Secondary | ICD-10-CM | POA: Insufficient documentation

## 2019-07-03 DIAGNOSIS — Z841 Family history of disorders of kidney and ureter: Secondary | ICD-10-CM | POA: Insufficient documentation

## 2019-07-03 DIAGNOSIS — D175 Benign lipomatous neoplasm of intra-abdominal organs: Secondary | ICD-10-CM | POA: Diagnosis not present

## 2019-07-03 DIAGNOSIS — N186 End stage renal disease: Secondary | ICD-10-CM | POA: Diagnosis not present

## 2019-07-03 DIAGNOSIS — Z8249 Family history of ischemic heart disease and other diseases of the circulatory system: Secondary | ICD-10-CM | POA: Insufficient documentation

## 2019-07-03 DIAGNOSIS — I132 Hypertensive heart and chronic kidney disease with heart failure and with stage 5 chronic kidney disease, or end stage renal disease: Secondary | ICD-10-CM | POA: Diagnosis not present

## 2019-07-03 DIAGNOSIS — J449 Chronic obstructive pulmonary disease, unspecified: Secondary | ICD-10-CM | POA: Diagnosis not present

## 2019-07-03 DIAGNOSIS — E669 Obesity, unspecified: Secondary | ICD-10-CM | POA: Insufficient documentation

## 2019-07-03 DIAGNOSIS — I5032 Chronic diastolic (congestive) heart failure: Secondary | ICD-10-CM | POA: Insufficient documentation

## 2019-07-03 DIAGNOSIS — Z833 Family history of diabetes mellitus: Secondary | ICD-10-CM | POA: Diagnosis not present

## 2019-07-03 DIAGNOSIS — K635 Polyp of colon: Secondary | ICD-10-CM

## 2019-07-03 DIAGNOSIS — Z6834 Body mass index (BMI) 34.0-34.9, adult: Secondary | ICD-10-CM | POA: Insufficient documentation

## 2019-07-03 DIAGNOSIS — D631 Anemia in chronic kidney disease: Secondary | ICD-10-CM | POA: Diagnosis not present

## 2019-07-03 DIAGNOSIS — Z79899 Other long term (current) drug therapy: Secondary | ICD-10-CM | POA: Insufficient documentation

## 2019-07-03 DIAGNOSIS — K6389 Other specified diseases of intestine: Secondary | ICD-10-CM | POA: Insufficient documentation

## 2019-07-03 HISTORY — PX: POLYPECTOMY: SHX5525

## 2019-07-03 HISTORY — PX: COLONOSCOPY: SHX5424

## 2019-07-03 SURGERY — COLONOSCOPY
Anesthesia: Moderate Sedation

## 2019-07-03 MED ORDER — STERILE WATER FOR IRRIGATION IR SOLN
Status: DC | PRN
  Administered 2019-07-03: 1.5 mL

## 2019-07-03 MED ORDER — SODIUM CHLORIDE 0.9 % IV SOLN: INTRAVENOUS | Status: DC

## 2019-07-03 MED ORDER — MEPERIDINE HCL 50 MG/ML IJ SOLN
INTRAMUSCULAR | Status: AC
  Filled 2019-07-03: qty 1

## 2019-07-03 MED ORDER — ONDANSETRON HCL 4 MG/2ML IJ SOLN
INTRAMUSCULAR | Status: DC | PRN
  Administered 2019-07-03: 4 mg via INTRAVENOUS

## 2019-07-03 MED ORDER — MIDAZOLAM HCL 5 MG/5ML IJ SOLN
INTRAMUSCULAR | Status: AC
  Filled 2019-07-03: qty 10

## 2019-07-03 MED ORDER — ONDANSETRON HCL 4 MG/2ML IJ SOLN
INTRAMUSCULAR | Status: AC
  Filled 2019-07-03: qty 2

## 2019-07-03 MED ORDER — MIDAZOLAM HCL 5 MG/5ML IJ SOLN
INTRAMUSCULAR | Status: DC | PRN
  Administered 2019-07-03: 1 mg via INTRAVENOUS
  Administered 2019-07-03 (×2): 2 mg via INTRAVENOUS

## 2019-07-03 MED ORDER — MEPERIDINE HCL 100 MG/ML IJ SOLN
INTRAMUSCULAR | Status: DC | PRN
  Administered 2019-07-03: 25 mg via INTRAVENOUS

## 2019-07-03 NOTE — Discharge Instructions (Signed)
Colonoscopy Discharge Instructions  Read the instructions outlined below and refer to this sheet in the next few weeks. These discharge instructions provide you with general information on caring for yourself after you leave the hospital. Your doctor may also give you specific instructions. While your treatment has been planned according to the most current medical practices available, unavoidable complications occasionally occur. If you have any problems or questions after discharge, call Dr. Gala Romney at [phone]. ACTIVITY  You may resume your regular activity, but move at a slower pace for the next 24 hours.   Take frequent rest periods for the next 24 hours.   Walking will help get rid of the air and reduce the bloated feeling in your belly (abdomen).   No driving for 24 hours (because of the medicine (anesthesia) used during the test).    Do not sign any important legal documents or operate any machinery for 24 hours (because of the anesthesia used during the test).  NUTRITION  Drink plenty of fluids.   You may resume your normal diet as instructed by your doctor.   Begin with a light meal and progress to your normal diet. Heavy or fried foods are harder to digest and may make you feel sick to your stomach (nauseated).   Avoid alcoholic beverages for 24 hours or as instructed.  MEDICATIONS  You may resume your normal medications unless your doctor tells you otherwise.  WHAT YOU CAN EXPECT TODAY  Some feelings of bloating in the abdomen.   Passage of more gas than usual.   Spotting of blood in your stool or on the toilet paper.  IF YOU HAD POLYPS REMOVED DURING THE COLONOSCOPY:  No aspirin products for 7 days or as instructed.   No alcohol for 7 days or as instructed.   Eat a soft diet for the next 24 hours.  FINDING OUT THE RESULTS OF YOUR TEST Not all test results are available during your visit. If your test results are not back during the visit, make an appointment  with your caregiver to find out the results. Do not assume everything is normal if you have not heard from your caregiver or the medical facility. It is important for you to follow up on all of your test results.  SEEK IMMEDIATE MEDICAL ATTENTION IF:  You have more than a spotting of blood in your stool.   Your belly is swollen (abdominal distention).   You are nauseated or vomiting.   You have a temperature over 101.   You have abdominal pain or discomfort that is severe or gets worse throughout the day.   Colon polyp information provided  Further recommendations to follow pending review of pathology report  At patient request, I spoke to Burnett Corrente at [phone] -and reviewed results.    Colon Polyps  Polyps are tissue growths inside the body. Polyps can grow in many places, including the large intestine (colon). A polyp may be a round bump or a mushroom-shaped growth. You could have one polyp or several. Most colon polyps are noncancerous (benign). However, some colon polyps can become cancerous over time. Finding and removing the polyps early can help prevent this. What are the causes? The exact cause of colon polyps is not known. What increases the risk? You are more likely to develop this condition if you:  Have a family history of colon cancer or colon polyps.  Are older than 36 or older than 45 if you are African American.  Have inflammatory bowel  disease, such as ulcerative colitis or Crohn's disease.  Have certain hereditary conditions, such as: ? Familial adenomatous polyposis. ? Lynch syndrome. ? Turcot syndrome. ? Peutz-Jeghers syndrome.  Are overweight.  Smoke cigarettes.  Do not get enough exercise.  Drink too much alcohol.  Eat a diet that is high in fat and red meat and low in fiber.  Had childhood cancer that was treated with abdominal radiation. What are the signs or symptoms? Most polyps do not cause symptoms. If you have symptoms, they  may include:  Blood coming from your rectum when having a bowel movement.  Blood in your stool. The stool may look dark red or black.  Abdominal pain.  A change in bowel habits, such as constipation or diarrhea. How is this diagnosed? This condition is diagnosed with a colonoscopy. This is a procedure in which a lighted, flexible scope is inserted into the anus and then passed into the colon to examine the area. Polyps are sometimes found when a colonoscopy is done as part of routine cancer screening tests. How is this treated? Treatment for this condition involves removing any polyps that are found. Most polyps can be removed during a colonoscopy. Those polyps will then be tested for cancer. Additional treatment may be needed depending on the results of testing. Follow these instructions at home: Lifestyle  Maintain a healthy weight, or lose weight if recommended by your health care provider.  Exercise every day or as told by your health care provider.  Do not use any products that contain nicotine or tobacco, such as cigarettes and e-cigarettes. If you need help quitting, ask your health care provider.  If you drink alcohol, limit how much you have: ? 0-1 drink a day for women. ? 0-2 drinks a day for men.  Be aware of how much alcohol is in your drink. In the [place] one drink equals one 12 oz bottle of beer (355 mL), one 5 oz glass of wine (148 mL), or one 1 oz shot of hard liquor (44 mL). Eating and drinking   Eat foods that are high in fiber, such as fruits, vegetables, and whole grains.  Eat foods that are high in calcium and vitamin D, such as milk, cheese, yogurt, eggs, liver, fish, and broccoli.  Limit foods that are high in fat, such as fried foods and desserts.  Limit the amount of red meat and processed meat you eat, such as hot dogs, sausage, bacon, and lunch meats. General instructions  Keep all follow-up visits as told by your health care provider. This is  important. ? This includes having regularly scheduled colonoscopies. ? Talk to your health care provider about when you need a colonoscopy. Contact a health care provider if:  You have new or worsening bleeding during a bowel movement.  You have new or increased blood in your stool.  You have a change in bowel habits.  You lose weight for no known reason. Summary  Polyps are tissue growths inside the body. Polyps can grow in many places, including the colon.  Most colon polyps are noncancerous (benign), but some can become cancerous over time.  This condition is diagnosed with a colonoscopy.  Treatment for this condition involves removing any polyps that are found. Most polyps can be removed during a colonoscopy. This information is not intended to replace advice given to you by your health care provider. Make sure you discuss any questions you have with your health care provider. Document Revised: 08/25/2017 Document Reviewed: 08/25/2017  Elsevier Patient Education  2020 [employer]   

## 2019-07-03 NOTE — H&P (Signed)
@LOGO @   Primary Care Physician:  Sinda Du, MD Primary Gastroenterologist:  Dr. Gala Romney  Pre-Procedure History & Physical: HPI:  Brian Barrera is a 49 y.o. male here for surveillance colonoscopy.  History of multiple colonic adenomas removed 2015.  No bowel symptoms currently. Past Medical History:  Diagnosis Date  . Anemia    Attributed to chronic disease/chronic kidney disease  . Chronic diastolic heart failure ([place]) 2011   EF 35% by echo in 2011; echo in 08/2011-normal EF, moderate to severe LVH; BNP level of [phone] in 2011-12  . Chronic obstructive pulmonary disease ([place])    with asthmatic component  . ESRD (end stage renal disease) on dialysis [facility])    secondary hyperparathyroidism  . Hypertension    h/o hypertensive crisis with encephalopathy  . Obesity   . Tobacco abuse    Approximate consumption of 25 pack years; continuing at 0.5 pack per day    Past Surgical History:  Procedure Laterality Date  . ARTERIOVENOUS GRAFT PLACEMENT  2011  . COLONOSCOPY N/A 02/04/2014   Procedure: COLONOSCOPY;  Surgeon: [name], MD;  [facility];  Service: Endoscopy;  Laterality: N/A;  12:00    Prior to Admission medications   Medication Sig Start Date End Date Taking? Authorizing Provider  albuterol (PROAIR HFA) 108 (90 BASE) MCG/ACT inhaler Inhale 2 puffs into the lungs every 4 (four) hours as needed for wheezing. 02/11/14  Yes Pandora, Modena Nunnery, MD  aspirin 325 MG tablet Take 325 mg by mouth daily.   Yes [provider]  AURYXIA 1 GM 210 MG(Fe) tablet Take 420 mg by mouth 3 (three) times daily with meals.  11/02/16  Yes [provider]  b complex-vitamin c-folic acid (NEPHRO-VITE) 0.8 MG TABS Take 0.8 mg by mouth daily.    Yes [provider]  budesonide-formoterol (SYMBICORT) 80-4.5 MCG/ACT inhaler Inhale 2 puffs into the lungs 2 (two) times daily. 06/05/13  Yes [place], Modena Nunnery, MD  diphenhydramine-acetaminophen (TYLENOL PM) 25-500 MG  TABS tablet Take 1 tablet by mouth at bedtime as needed.   Yes [provider]  Omega-3 1000 MG CAPS Take 2,000 mg by mouth daily.   Yes [provider]  SENSIPAR 30 MG tablet Take 30 mg by mouth daily.  01/04/14  Yes [provider]  sevelamer (RENVELA) 800 MG tablet Take 1,600-3,200 mg by mouth See admin instructions. Take 3200 mg with meals and 1600 with snack   Yes [provider]  amLODipine (NORVASC) 10 MG tablet TAKE 1 TABLET EVERY DAY Patient not taking: Reported on 06/20/2019 07/06/13   [name], MD  HYDROcodone-acetaminophen (NORCO/VICODIN) 5-325 MG per tablet Take 1 tablet by mouth every 6 (six) hours as needed for pain. Patient not taking: Reported on 06/20/2019 02/21/13   [name], MD    Allergies as of 04/11/2019  . (No Known Allergies)    Family History  Problem Relation Age of Onset  . Heart disease Father   . Diabetes Sister   . Hypertension Sister   . Diabetes Sister   . Hypertension Sister   . Kidney disease Mother   . Hypertension Mother   . Hypertension Sister   . Hypertension Brother   . Hypertension Brother   . Hypertension Brother   . Colon cancer Neg Hx     Social History   Socioeconomic History  . Marital status: Single    Spouse name: Not on file  . Number of children: 3  . Years  of education: Not on file  . Highest education level: Not on file  Occupational History  . Occupation: Disability  Tobacco Use  . Smoking status: Current Every Day Smoker    Packs/day: 0.50    Years: 25.00    Pack years: 12.50    Types: Cigarettes  . Smokeless tobacco: Never Used  . Tobacco comment: 8-10 ciggs per day   Substance and Sexual Activity  . Alcohol use: No    Comment: quit etoh about 2011. used to drink about 8-10 beers a week  . Drug use: No  . Sexual activity: Not on file  Other Topics Concern  . Not on file  Social History Narrative  . Not on file   Social Determinants of Health   Financial  Resource Strain:   . Difficulty of Paying Living Expenses: Not on file  Food Insecurity:   . Worried About Charity fundraiser in the Last Year: Not on file  . Ran Out of Food in the Last Year: Not on file  Transportation Needs:   . Lack of Transportation (Medical): Not on file  . Lack of Transportation (Non-Medical): Not on file  Physical Activity:   . Days of Exercise per Week: Not on file  . Minutes of Exercise per Session: Not on file  Stress:   . Feeling of Stress : Not on file  Social Connections:   . Frequency of Communication with Friends and Family: Not on file  . Frequency of Social Gatherings with Friends and Family: Not on file  . Attends Religious Services: Not on file  . Active Member of Clubs or Organizations: Not on file  . Attends Archivist Meetings: Not on file  . Marital Status: Not on file  Intimate Partner Violence:   . Fear of Current or Ex-Partner: Not on file  . Emotionally Abused: Not on file  . Physically Abused: Not on file  . Sexually Abused: Not on file    Review of Systems: See HPI, otherwise negative ROS  Physical Exam: BP (!) 128/92   Pulse 93   Temp 99.1 F (37.3 C) (Oral)   Resp 13   Ht 6' (1.829 m)   SpO2 99%   BMI 34.18 kg/m  General:   Alert,  Well-developed, well-nourished, pleasant and cooperative in NAD Neck:  Supple; no masses or thyromegaly. No significant cervical adenopathy. Lungs:  Clear throughout to auscultation.   No wheezes, crackles, or rhonchi. No acute distress. Heart:  Regular rate and rhythm; no murmurs, clicks, rubs,  or gallops. Abdomen: Non-distended, normal bowel sounds.  Soft and nontender without appreciable mass or hepatosplenomegaly.   Impression/Plan: 49 year old gentleman with a history colonic adenomas.  Here for surveillance colonoscopy per plan.   The risks, benefits, limitations, alternatives and imponderables have been reviewed with the patient. Questions have been answered. All parties are  agreeable.

## 2019-07-03 NOTE — Op Note (Signed)
[facility] Patient Name: Brian Barrera Procedure Date: 07/03/2019 10:47 AM MRN: [id number] Date of Birth: [birth year] Attending MD: Norvel Richards , MD CSN: [id number] Age: 49 Admit Type: Outpatient Procedure:                Colonoscopy Indications:              High risk colon cancer surveillance: Personal                            history of colonic polyps Providers:                Norvel Richards, MD, Charlsie Quest. Theda Sers RN, RN,                            Aram Candela Referring MD:              Medicines:                Propofol per Anesthesia Complications:            No immediate complications. Estimated Blood Loss:     Estimated blood loss was minimal. Procedure:                Pre-Anesthesia Assessment:                           - Prior to the procedure, a History and Physical                            was performed, and patient medications and                            allergies were reviewed. The patient's tolerance of                            previous anesthesia was also reviewed. The risks                            and benefits of the procedure and the sedation                            options and risks were discussed with the patient.                            All questions were answered, and informed consent                            was obtained. Prior Anticoagulants: The patient has                            taken no previous anticoagulant or antiplatelet                            agents. ASA Grade Assessment: III - A patient with  severe systemic disease. After reviewing the risks                            and benefits, the patient was deemed in                            satisfactory condition to undergo the procedure.                           After obtaining informed consent, the colonoscope                            was passed under direct vision. Throughout the                            procedure, the  patient's blood pressure, pulse, and                            oxygen saturations were monitored continuously. The                            CF-[id number] ([id number]) scope was introduced through                            the anus and advanced to the the cecum, identified                            by appendiceal orifice and ileocecal valve. The                            colonoscopy was performed without difficulty. The                            patient tolerated the procedure well. The quality                            of the bowel preparation was adequate. The                            ileocecal valve, appendiceal orifice, and rectum                            were photographed. Scope In: 11:11:31 AM Scope Out: 11:33:57 AM Scope Withdrawal Time: 0 hours 11 minutes 56 seconds  Total Procedure Duration: 0 hours 22 minutes 26 seconds  Findings:      The perianal and digital rectal examinations were normal. Somewhat of a       redundant colon. Yellowish 2 cm submucosal bulge in the ascending       segment. Positive pillow sign consistent with a lipoma.      Three sessile polyps were found in the descending colon and hepatic       flexure. The polyps were 4 to 8 mm in size. These polyps were removed       with a cold snare. Resection and retrieval were complete.  Estimated       blood loss was minimal. Tiny mamillation's in the rectum and       rectosigmoid not manipulated.      The exam was otherwise without abnormality on direct and retroflexion       views. Impression:               - Three 4 to 8 mm polyps in the descending colon                            and at the hepatic flexure, removed with a cold                            snare. Resected and retrieved. Colonic lipoma.                           - The examination was otherwise normal on direct                            and retroflexion views. Moderate Sedation:      Moderate (conscious) sedation was administered by the  endoscopy nurse       and supervised by the endoscopist. The following parameters were       monitored: oxygen saturation, heart rate, blood pressure, respiratory       rate, EKG, adequacy of pulmonary ventilation, and response to care.       Total physician intraservice time was 25 minutes. Recommendation:           - Patient has a contact number available for                            emergencies. The signs and symptoms of potential                            delayed complications were discussed with the                            patient. Return to normal activities tomorrow.                            Written discharge instructions were provided to the                            patient.                           - Resume previous diet.                           - Continue present medications.                           - Repeat colonoscopy date to be determined after                            pending pathology results are reviewed for  surveillance.                           - Return to GI office (date not yet determined). Procedure Code(s):        --- Professional ---                           [phone], Colonoscopy, flexible; with removal of                            tumor(s), polyp(s), or other lesion(s) by snare                            technique                           99153, Moderate sedation; each additional 15                            minutes intraservice time                           G0500, Moderate sedation services provided by the                            same physician or other qualified health care                            professional performing a gastrointestinal                            endoscopic service that sedation supports,                            requiring the presence of an independent trained                            observer to assist in the monitoring of the                            patient's level of  consciousness and physiological                            status; initial 15 minutes of intra-service time;                            patient age 25 years or older (additional time may                            be reported with [phone], as appropriate) Diagnosis Code(s):        --- Professional ---                           Z86.010, Personal history of colonic polyps  K63.5, Polyp of colon CPT copyright 2019 American Medical Association. All rights reserved. The codes documented in this report are preliminary and upon coder review may  be revised to meet current compliance requirements. Cristopher Estimable. Zaki Gertsch, MD Norvel Richards, MD 07/03/2019 11:44:11 AM This report has been signed electronically. Number of Addenda: 0

## 2019-07-04 ENCOUNTER — Encounter: Payer: Self-pay | Admitting: Internal Medicine

## 2019-07-04 DIAGNOSIS — N186 End stage renal disease: Secondary | ICD-10-CM | POA: Diagnosis not present

## 2019-07-04 DIAGNOSIS — D509 Iron deficiency anemia, unspecified: Secondary | ICD-10-CM | POA: Diagnosis not present

## 2019-07-04 DIAGNOSIS — N2581 Secondary hyperparathyroidism of renal origin: Secondary | ICD-10-CM | POA: Diagnosis not present

## 2019-07-04 DIAGNOSIS — Z992 Dependence on renal dialysis: Secondary | ICD-10-CM | POA: Diagnosis not present

## 2019-07-04 LAB — SURGICAL PATHOLOGY

## 2019-07-05 DIAGNOSIS — T82858A Stenosis of vascular prosthetic devices, implants and grafts, initial encounter: Secondary | ICD-10-CM | POA: Diagnosis not present

## 2019-07-05 DIAGNOSIS — I871 Compression of vein: Secondary | ICD-10-CM | POA: Diagnosis not present

## 2019-07-06 DIAGNOSIS — N2581 Secondary hyperparathyroidism of renal origin: Secondary | ICD-10-CM | POA: Diagnosis not present

## 2019-07-06 DIAGNOSIS — D509 Iron deficiency anemia, unspecified: Secondary | ICD-10-CM | POA: Diagnosis not present

## 2019-07-06 DIAGNOSIS — Z992 Dependence on renal dialysis: Secondary | ICD-10-CM | POA: Diagnosis not present

## 2019-07-06 DIAGNOSIS — N186 End stage renal disease: Secondary | ICD-10-CM | POA: Diagnosis not present

## 2019-07-09 DIAGNOSIS — N2581 Secondary hyperparathyroidism of renal origin: Secondary | ICD-10-CM | POA: Diagnosis not present

## 2019-07-09 DIAGNOSIS — N186 End stage renal disease: Secondary | ICD-10-CM | POA: Diagnosis not present

## 2019-07-09 DIAGNOSIS — Z992 Dependence on renal dialysis: Secondary | ICD-10-CM | POA: Diagnosis not present

## 2019-07-09 DIAGNOSIS — D509 Iron deficiency anemia, unspecified: Secondary | ICD-10-CM | POA: Diagnosis not present

## 2019-07-10 DIAGNOSIS — Z992 Dependence on renal dialysis: Secondary | ICD-10-CM | POA: Diagnosis not present

## 2019-07-10 DIAGNOSIS — N2581 Secondary hyperparathyroidism of renal origin: Secondary | ICD-10-CM | POA: Diagnosis not present

## 2019-07-10 DIAGNOSIS — N186 End stage renal disease: Secondary | ICD-10-CM | POA: Diagnosis not present

## 2019-07-10 DIAGNOSIS — D509 Iron deficiency anemia, unspecified: Secondary | ICD-10-CM | POA: Diagnosis not present

## 2019-07-11 DIAGNOSIS — N186 End stage renal disease: Secondary | ICD-10-CM | POA: Diagnosis not present

## 2019-07-11 DIAGNOSIS — Z992 Dependence on renal dialysis: Secondary | ICD-10-CM | POA: Diagnosis not present

## 2019-07-11 DIAGNOSIS — D509 Iron deficiency anemia, unspecified: Secondary | ICD-10-CM | POA: Diagnosis not present

## 2019-07-11 DIAGNOSIS — N2581 Secondary hyperparathyroidism of renal origin: Secondary | ICD-10-CM | POA: Diagnosis not present

## 2019-07-13 DIAGNOSIS — N186 End stage renal disease: Secondary | ICD-10-CM | POA: Diagnosis not present

## 2019-07-13 DIAGNOSIS — Z992 Dependence on renal dialysis: Secondary | ICD-10-CM | POA: Diagnosis not present

## 2019-07-13 DIAGNOSIS — N2581 Secondary hyperparathyroidism of renal origin: Secondary | ICD-10-CM | POA: Diagnosis not present

## 2019-07-13 DIAGNOSIS — D509 Iron deficiency anemia, unspecified: Secondary | ICD-10-CM | POA: Diagnosis not present

## 2019-07-16 DIAGNOSIS — N2581 Secondary hyperparathyroidism of renal origin: Secondary | ICD-10-CM | POA: Diagnosis not present

## 2019-07-16 DIAGNOSIS — Z992 Dependence on renal dialysis: Secondary | ICD-10-CM | POA: Diagnosis not present

## 2019-07-16 DIAGNOSIS — N186 End stage renal disease: Secondary | ICD-10-CM | POA: Diagnosis not present

## 2019-07-16 DIAGNOSIS — D509 Iron deficiency anemia, unspecified: Secondary | ICD-10-CM | POA: Diagnosis not present

## 2019-07-18 DIAGNOSIS — N186 End stage renal disease: Secondary | ICD-10-CM | POA: Diagnosis not present

## 2019-07-18 DIAGNOSIS — Z992 Dependence on renal dialysis: Secondary | ICD-10-CM | POA: Diagnosis not present

## 2019-07-18 DIAGNOSIS — D509 Iron deficiency anemia, unspecified: Secondary | ICD-10-CM | POA: Diagnosis not present

## 2019-07-18 DIAGNOSIS — N2581 Secondary hyperparathyroidism of renal origin: Secondary | ICD-10-CM | POA: Diagnosis not present

## 2019-07-20 DIAGNOSIS — N186 End stage renal disease: Secondary | ICD-10-CM | POA: Diagnosis not present

## 2019-07-20 DIAGNOSIS — D509 Iron deficiency anemia, unspecified: Secondary | ICD-10-CM | POA: Diagnosis not present

## 2019-07-20 DIAGNOSIS — Z992 Dependence on renal dialysis: Secondary | ICD-10-CM | POA: Diagnosis not present

## 2019-07-20 DIAGNOSIS — N2581 Secondary hyperparathyroidism of renal origin: Secondary | ICD-10-CM | POA: Diagnosis not present

## 2019-07-22 DIAGNOSIS — N186 End stage renal disease: Secondary | ICD-10-CM | POA: Diagnosis not present

## 2019-07-22 DIAGNOSIS — Z992 Dependence on renal dialysis: Secondary | ICD-10-CM | POA: Diagnosis not present

## 2019-07-23 DIAGNOSIS — N186 End stage renal disease: Secondary | ICD-10-CM | POA: Diagnosis not present

## 2019-07-23 DIAGNOSIS — Z992 Dependence on renal dialysis: Secondary | ICD-10-CM | POA: Diagnosis not present

## 2019-07-23 DIAGNOSIS — N2581 Secondary hyperparathyroidism of renal origin: Secondary | ICD-10-CM | POA: Diagnosis not present

## 2019-07-23 DIAGNOSIS — D509 Iron deficiency anemia, unspecified: Secondary | ICD-10-CM | POA: Diagnosis not present

## 2019-07-25 DIAGNOSIS — D509 Iron deficiency anemia, unspecified: Secondary | ICD-10-CM | POA: Diagnosis not present

## 2019-07-25 DIAGNOSIS — N186 End stage renal disease: Secondary | ICD-10-CM | POA: Diagnosis not present

## 2019-07-25 DIAGNOSIS — N2581 Secondary hyperparathyroidism of renal origin: Secondary | ICD-10-CM | POA: Diagnosis not present

## 2019-07-25 DIAGNOSIS — Z992 Dependence on renal dialysis: Secondary | ICD-10-CM | POA: Diagnosis not present

## 2019-07-27 DIAGNOSIS — N186 End stage renal disease: Secondary | ICD-10-CM | POA: Diagnosis not present

## 2019-07-27 DIAGNOSIS — N2581 Secondary hyperparathyroidism of renal origin: Secondary | ICD-10-CM | POA: Diagnosis not present

## 2019-07-27 DIAGNOSIS — D509 Iron deficiency anemia, unspecified: Secondary | ICD-10-CM | POA: Diagnosis not present

## 2019-07-27 DIAGNOSIS — Z992 Dependence on renal dialysis: Secondary | ICD-10-CM | POA: Diagnosis not present

## 2019-07-30 DIAGNOSIS — D509 Iron deficiency anemia, unspecified: Secondary | ICD-10-CM | POA: Diagnosis not present

## 2019-07-30 DIAGNOSIS — N186 End stage renal disease: Secondary | ICD-10-CM | POA: Diagnosis not present

## 2019-07-30 DIAGNOSIS — N2581 Secondary hyperparathyroidism of renal origin: Secondary | ICD-10-CM | POA: Diagnosis not present

## 2019-07-30 DIAGNOSIS — Z992 Dependence on renal dialysis: Secondary | ICD-10-CM | POA: Diagnosis not present

## 2019-08-01 DIAGNOSIS — D509 Iron deficiency anemia, unspecified: Secondary | ICD-10-CM | POA: Diagnosis not present

## 2019-08-01 DIAGNOSIS — N2581 Secondary hyperparathyroidism of renal origin: Secondary | ICD-10-CM | POA: Diagnosis not present

## 2019-08-01 DIAGNOSIS — Z992 Dependence on renal dialysis: Secondary | ICD-10-CM | POA: Diagnosis not present

## 2019-08-01 DIAGNOSIS — N186 End stage renal disease: Secondary | ICD-10-CM | POA: Diagnosis not present

## 2019-08-03 DIAGNOSIS — D509 Iron deficiency anemia, unspecified: Secondary | ICD-10-CM | POA: Diagnosis not present

## 2019-08-03 DIAGNOSIS — N2581 Secondary hyperparathyroidism of renal origin: Secondary | ICD-10-CM | POA: Diagnosis not present

## 2019-08-03 DIAGNOSIS — Z992 Dependence on renal dialysis: Secondary | ICD-10-CM | POA: Diagnosis not present

## 2019-08-03 DIAGNOSIS — N186 End stage renal disease: Secondary | ICD-10-CM | POA: Diagnosis not present

## 2019-08-06 DIAGNOSIS — N186 End stage renal disease: Secondary | ICD-10-CM | POA: Diagnosis not present

## 2019-08-06 DIAGNOSIS — Z992 Dependence on renal dialysis: Secondary | ICD-10-CM | POA: Diagnosis not present

## 2019-08-06 DIAGNOSIS — D509 Iron deficiency anemia, unspecified: Secondary | ICD-10-CM | POA: Diagnosis not present

## 2019-08-06 DIAGNOSIS — N2581 Secondary hyperparathyroidism of renal origin: Secondary | ICD-10-CM | POA: Diagnosis not present

## 2019-08-08 DIAGNOSIS — Z23 Encounter for immunization: Secondary | ICD-10-CM | POA: Diagnosis not present

## 2019-08-08 DIAGNOSIS — D509 Iron deficiency anemia, unspecified: Secondary | ICD-10-CM | POA: Diagnosis not present

## 2019-08-08 DIAGNOSIS — N186 End stage renal disease: Secondary | ICD-10-CM | POA: Diagnosis not present

## 2019-08-08 DIAGNOSIS — Z992 Dependence on renal dialysis: Secondary | ICD-10-CM | POA: Diagnosis not present

## 2019-08-08 DIAGNOSIS — N2581 Secondary hyperparathyroidism of renal origin: Secondary | ICD-10-CM | POA: Diagnosis not present

## 2019-08-09 DIAGNOSIS — N186 End stage renal disease: Secondary | ICD-10-CM | POA: Diagnosis not present

## 2019-08-09 DIAGNOSIS — N2581 Secondary hyperparathyroidism of renal origin: Secondary | ICD-10-CM | POA: Diagnosis not present

## 2019-08-09 DIAGNOSIS — D509 Iron deficiency anemia, unspecified: Secondary | ICD-10-CM | POA: Diagnosis not present

## 2019-08-09 DIAGNOSIS — Z992 Dependence on renal dialysis: Secondary | ICD-10-CM | POA: Diagnosis not present

## 2019-08-10 DIAGNOSIS — N2581 Secondary hyperparathyroidism of renal origin: Secondary | ICD-10-CM | POA: Diagnosis not present

## 2019-08-10 DIAGNOSIS — N186 End stage renal disease: Secondary | ICD-10-CM | POA: Diagnosis not present

## 2019-08-10 DIAGNOSIS — D509 Iron deficiency anemia, unspecified: Secondary | ICD-10-CM | POA: Diagnosis not present

## 2019-08-10 DIAGNOSIS — Z992 Dependence on renal dialysis: Secondary | ICD-10-CM | POA: Diagnosis not present

## 2019-08-13 DIAGNOSIS — N186 End stage renal disease: Secondary | ICD-10-CM | POA: Diagnosis not present

## 2019-08-13 DIAGNOSIS — D509 Iron deficiency anemia, unspecified: Secondary | ICD-10-CM | POA: Diagnosis not present

## 2019-08-13 DIAGNOSIS — N2581 Secondary hyperparathyroidism of renal origin: Secondary | ICD-10-CM | POA: Diagnosis not present

## 2019-08-13 DIAGNOSIS — Z992 Dependence on renal dialysis: Secondary | ICD-10-CM | POA: Diagnosis not present

## 2019-08-14 ENCOUNTER — Other Ambulatory Visit (HOSPITAL_COMMUNITY): Payer: Self-pay | Admitting: Neurology

## 2019-08-14 DIAGNOSIS — M545 Low back pain, unspecified: Secondary | ICD-10-CM

## 2019-08-14 DIAGNOSIS — M542 Cervicalgia: Secondary | ICD-10-CM | POA: Diagnosis not present

## 2019-08-14 DIAGNOSIS — G8929 Other chronic pain: Secondary | ICD-10-CM | POA: Insufficient documentation

## 2019-08-14 DIAGNOSIS — M25569 Pain in unspecified knee: Secondary | ICD-10-CM | POA: Diagnosis not present

## 2019-08-14 DIAGNOSIS — M130 Polyarthritis, unspecified: Secondary | ICD-10-CM | POA: Diagnosis not present

## 2019-08-14 DIAGNOSIS — G894 Chronic pain syndrome: Secondary | ICD-10-CM | POA: Diagnosis not present

## 2019-08-15 DIAGNOSIS — Z992 Dependence on renal dialysis: Secondary | ICD-10-CM | POA: Diagnosis not present

## 2019-08-15 DIAGNOSIS — N2581 Secondary hyperparathyroidism of renal origin: Secondary | ICD-10-CM | POA: Diagnosis not present

## 2019-08-15 DIAGNOSIS — D509 Iron deficiency anemia, unspecified: Secondary | ICD-10-CM | POA: Diagnosis not present

## 2019-08-15 DIAGNOSIS — N186 End stage renal disease: Secondary | ICD-10-CM | POA: Diagnosis not present

## 2019-08-16 ENCOUNTER — Ambulatory Visit (HOSPITAL_COMMUNITY)
Admission: RE | Admit: 2019-08-16 | Discharge: 2019-08-16 | Disposition: A | Discharge status: Home / Self Care | Payer: Medicare Other | Source: Ambulatory Visit | Attending: Neurology | Admitting: Neurology

## 2019-08-16 ENCOUNTER — Other Ambulatory Visit: Payer: Self-pay

## 2019-08-16 DIAGNOSIS — M545 Low back pain, unspecified: Secondary | ICD-10-CM

## 2019-08-16 DIAGNOSIS — M542 Cervicalgia: Secondary | ICD-10-CM | POA: Diagnosis not present

## 2019-08-17 DIAGNOSIS — D509 Iron deficiency anemia, unspecified: Secondary | ICD-10-CM | POA: Diagnosis not present

## 2019-08-17 DIAGNOSIS — Z992 Dependence on renal dialysis: Secondary | ICD-10-CM | POA: Diagnosis not present

## 2019-08-17 DIAGNOSIS — N186 End stage renal disease: Secondary | ICD-10-CM | POA: Diagnosis not present

## 2019-08-17 DIAGNOSIS — N2581 Secondary hyperparathyroidism of renal origin: Secondary | ICD-10-CM | POA: Diagnosis not present

## 2019-08-19 DIAGNOSIS — D509 Iron deficiency anemia, unspecified: Secondary | ICD-10-CM | POA: Diagnosis not present

## 2019-08-19 DIAGNOSIS — N186 End stage renal disease: Secondary | ICD-10-CM | POA: Diagnosis not present

## 2019-08-19 DIAGNOSIS — N2581 Secondary hyperparathyroidism of renal origin: Secondary | ICD-10-CM | POA: Diagnosis not present

## 2019-08-19 DIAGNOSIS — Z992 Dependence on renal dialysis: Secondary | ICD-10-CM | POA: Diagnosis not present

## 2019-08-20 DIAGNOSIS — D509 Iron deficiency anemia, unspecified: Secondary | ICD-10-CM | POA: Diagnosis not present

## 2019-08-20 DIAGNOSIS — Z992 Dependence on renal dialysis: Secondary | ICD-10-CM | POA: Diagnosis not present

## 2019-08-20 DIAGNOSIS — N2581 Secondary hyperparathyroidism of renal origin: Secondary | ICD-10-CM | POA: Diagnosis not present

## 2019-08-20 DIAGNOSIS — N186 End stage renal disease: Secondary | ICD-10-CM | POA: Diagnosis not present

## 2019-08-22 DIAGNOSIS — N2581 Secondary hyperparathyroidism of renal origin: Secondary | ICD-10-CM | POA: Diagnosis not present

## 2019-08-22 DIAGNOSIS — Z992 Dependence on renal dialysis: Secondary | ICD-10-CM | POA: Diagnosis not present

## 2019-08-22 DIAGNOSIS — N186 End stage renal disease: Secondary | ICD-10-CM | POA: Diagnosis not present

## 2019-08-22 DIAGNOSIS — D509 Iron deficiency anemia, unspecified: Secondary | ICD-10-CM | POA: Diagnosis not present

## 2019-08-23 DIAGNOSIS — N186 End stage renal disease: Secondary | ICD-10-CM | POA: Diagnosis not present

## 2019-08-23 DIAGNOSIS — D509 Iron deficiency anemia, unspecified: Secondary | ICD-10-CM | POA: Diagnosis not present

## 2019-08-23 DIAGNOSIS — N2581 Secondary hyperparathyroidism of renal origin: Secondary | ICD-10-CM | POA: Diagnosis not present

## 2019-08-23 DIAGNOSIS — Z992 Dependence on renal dialysis: Secondary | ICD-10-CM | POA: Diagnosis not present

## 2019-08-24 DIAGNOSIS — N186 End stage renal disease: Secondary | ICD-10-CM | POA: Diagnosis not present

## 2019-08-24 DIAGNOSIS — N2581 Secondary hyperparathyroidism of renal origin: Secondary | ICD-10-CM | POA: Diagnosis not present

## 2019-08-24 DIAGNOSIS — Z992 Dependence on renal dialysis: Secondary | ICD-10-CM | POA: Diagnosis not present

## 2019-08-24 DIAGNOSIS — D509 Iron deficiency anemia, unspecified: Secondary | ICD-10-CM | POA: Diagnosis not present

## 2019-08-27 DIAGNOSIS — Z992 Dependence on renal dialysis: Secondary | ICD-10-CM | POA: Diagnosis not present

## 2019-08-27 DIAGNOSIS — N2581 Secondary hyperparathyroidism of renal origin: Secondary | ICD-10-CM | POA: Diagnosis not present

## 2019-08-27 DIAGNOSIS — N186 End stage renal disease: Secondary | ICD-10-CM | POA: Diagnosis not present

## 2019-08-27 DIAGNOSIS — D509 Iron deficiency anemia, unspecified: Secondary | ICD-10-CM | POA: Diagnosis not present

## 2019-08-29 DIAGNOSIS — N186 End stage renal disease: Secondary | ICD-10-CM | POA: Diagnosis not present

## 2019-08-29 DIAGNOSIS — D509 Iron deficiency anemia, unspecified: Secondary | ICD-10-CM | POA: Diagnosis not present

## 2019-08-29 DIAGNOSIS — Z992 Dependence on renal dialysis: Secondary | ICD-10-CM | POA: Diagnosis not present

## 2019-08-29 DIAGNOSIS — N2581 Secondary hyperparathyroidism of renal origin: Secondary | ICD-10-CM | POA: Diagnosis not present

## 2019-08-31 DIAGNOSIS — D509 Iron deficiency anemia, unspecified: Secondary | ICD-10-CM | POA: Diagnosis not present

## 2019-08-31 DIAGNOSIS — N186 End stage renal disease: Secondary | ICD-10-CM | POA: Diagnosis not present

## 2019-08-31 DIAGNOSIS — Z992 Dependence on renal dialysis: Secondary | ICD-10-CM | POA: Diagnosis not present

## 2019-08-31 DIAGNOSIS — N2581 Secondary hyperparathyroidism of renal origin: Secondary | ICD-10-CM | POA: Diagnosis not present

## 2019-09-03 DIAGNOSIS — N186 End stage renal disease: Secondary | ICD-10-CM | POA: Diagnosis not present

## 2019-09-03 DIAGNOSIS — D509 Iron deficiency anemia, unspecified: Secondary | ICD-10-CM | POA: Diagnosis not present

## 2019-09-03 DIAGNOSIS — N2581 Secondary hyperparathyroidism of renal origin: Secondary | ICD-10-CM | POA: Diagnosis not present

## 2019-09-03 DIAGNOSIS — Z992 Dependence on renal dialysis: Secondary | ICD-10-CM | POA: Diagnosis not present

## 2019-09-04 DIAGNOSIS — Z23 Encounter for immunization: Secondary | ICD-10-CM | POA: Diagnosis not present

## 2019-09-05 DIAGNOSIS — Z992 Dependence on renal dialysis: Secondary | ICD-10-CM | POA: Diagnosis not present

## 2019-09-05 DIAGNOSIS — N186 End stage renal disease: Secondary | ICD-10-CM | POA: Diagnosis not present

## 2019-09-05 DIAGNOSIS — D509 Iron deficiency anemia, unspecified: Secondary | ICD-10-CM | POA: Diagnosis not present

## 2019-09-05 DIAGNOSIS — N2581 Secondary hyperparathyroidism of renal origin: Secondary | ICD-10-CM | POA: Diagnosis not present

## 2019-09-07 DIAGNOSIS — Z992 Dependence on renal dialysis: Secondary | ICD-10-CM | POA: Diagnosis not present

## 2019-09-07 DIAGNOSIS — N2581 Secondary hyperparathyroidism of renal origin: Secondary | ICD-10-CM | POA: Diagnosis not present

## 2019-09-07 DIAGNOSIS — N186 End stage renal disease: Secondary | ICD-10-CM | POA: Diagnosis not present

## 2019-09-07 DIAGNOSIS — D509 Iron deficiency anemia, unspecified: Secondary | ICD-10-CM | POA: Diagnosis not present

## 2019-09-08 DIAGNOSIS — D509 Iron deficiency anemia, unspecified: Secondary | ICD-10-CM | POA: Diagnosis not present

## 2019-09-08 DIAGNOSIS — Z992 Dependence on renal dialysis: Secondary | ICD-10-CM | POA: Diagnosis not present

## 2019-09-08 DIAGNOSIS — N186 End stage renal disease: Secondary | ICD-10-CM | POA: Diagnosis not present

## 2019-09-08 DIAGNOSIS — N2581 Secondary hyperparathyroidism of renal origin: Secondary | ICD-10-CM | POA: Diagnosis not present

## 2019-09-10 DIAGNOSIS — D509 Iron deficiency anemia, unspecified: Secondary | ICD-10-CM | POA: Diagnosis not present

## 2019-09-10 DIAGNOSIS — Z992 Dependence on renal dialysis: Secondary | ICD-10-CM | POA: Diagnosis not present

## 2019-09-10 DIAGNOSIS — N186 End stage renal disease: Secondary | ICD-10-CM | POA: Diagnosis not present

## 2019-09-10 DIAGNOSIS — N2581 Secondary hyperparathyroidism of renal origin: Secondary | ICD-10-CM | POA: Diagnosis not present

## 2019-09-11 ENCOUNTER — Other Ambulatory Visit (HOSPITAL_COMMUNITY): Payer: Self-pay | Admitting: Neurology

## 2019-09-11 ENCOUNTER — Ambulatory Visit (HOSPITAL_COMMUNITY)
Admission: RE | Admit: 2019-09-11 | Discharge: 2019-09-11 | Disposition: A | Discharge status: Home / Self Care | Payer: Medicare Other | Source: Ambulatory Visit | Attending: Neurology | Admitting: Neurology

## 2019-09-11 ENCOUNTER — Other Ambulatory Visit: Payer: Self-pay

## 2019-09-11 DIAGNOSIS — M25462 Effusion, left knee: Secondary | ICD-10-CM | POA: Diagnosis not present

## 2019-09-11 DIAGNOSIS — M25561 Pain in right knee: Secondary | ICD-10-CM | POA: Diagnosis not present

## 2019-09-11 DIAGNOSIS — M25562 Pain in left knee: Secondary | ICD-10-CM

## 2019-09-11 DIAGNOSIS — M25512 Pain in left shoulder: Secondary | ICD-10-CM | POA: Insufficient documentation

## 2019-09-11 DIAGNOSIS — M542 Cervicalgia: Secondary | ICD-10-CM | POA: Diagnosis not present

## 2019-09-11 DIAGNOSIS — Z8679 Personal history of other diseases of the circulatory system: Secondary | ICD-10-CM | POA: Diagnosis not present

## 2019-09-11 DIAGNOSIS — N08 Glomerular disorders in diseases classified elsewhere: Secondary | ICD-10-CM | POA: Diagnosis not present

## 2019-09-11 DIAGNOSIS — M25569 Pain in unspecified knee: Secondary | ICD-10-CM | POA: Diagnosis not present

## 2019-09-11 DIAGNOSIS — M545 Low back pain: Secondary | ICD-10-CM | POA: Diagnosis not present

## 2019-09-12 DIAGNOSIS — D509 Iron deficiency anemia, unspecified: Secondary | ICD-10-CM | POA: Diagnosis not present

## 2019-09-12 DIAGNOSIS — N186 End stage renal disease: Secondary | ICD-10-CM | POA: Diagnosis not present

## 2019-09-12 DIAGNOSIS — Z992 Dependence on renal dialysis: Secondary | ICD-10-CM | POA: Diagnosis not present

## 2019-09-12 DIAGNOSIS — N2581 Secondary hyperparathyroidism of renal origin: Secondary | ICD-10-CM | POA: Diagnosis not present

## 2019-09-14 DIAGNOSIS — Z992 Dependence on renal dialysis: Secondary | ICD-10-CM | POA: Diagnosis not present

## 2019-09-14 DIAGNOSIS — D509 Iron deficiency anemia, unspecified: Secondary | ICD-10-CM | POA: Diagnosis not present

## 2019-09-14 DIAGNOSIS — N2581 Secondary hyperparathyroidism of renal origin: Secondary | ICD-10-CM | POA: Diagnosis not present

## 2019-09-14 DIAGNOSIS — N186 End stage renal disease: Secondary | ICD-10-CM | POA: Diagnosis not present

## 2019-09-17 DIAGNOSIS — N186 End stage renal disease: Secondary | ICD-10-CM | POA: Diagnosis not present

## 2019-09-17 DIAGNOSIS — N2581 Secondary hyperparathyroidism of renal origin: Secondary | ICD-10-CM | POA: Diagnosis not present

## 2019-09-17 DIAGNOSIS — D509 Iron deficiency anemia, unspecified: Secondary | ICD-10-CM | POA: Diagnosis not present

## 2019-09-17 DIAGNOSIS — Z992 Dependence on renal dialysis: Secondary | ICD-10-CM | POA: Diagnosis not present

## 2019-09-18 DIAGNOSIS — N200 Calculus of kidney: Secondary | ICD-10-CM | POA: Diagnosis not present

## 2019-09-18 DIAGNOSIS — Z992 Dependence on renal dialysis: Secondary | ICD-10-CM | POA: Diagnosis not present

## 2019-09-18 DIAGNOSIS — I120 Hypertensive chronic kidney disease with stage 5 chronic kidney disease or end stage renal disease: Secondary | ICD-10-CM | POA: Diagnosis not present

## 2019-09-18 DIAGNOSIS — I700 Atherosclerosis of aorta: Secondary | ICD-10-CM | POA: Diagnosis not present

## 2019-09-18 DIAGNOSIS — N2581 Secondary hyperparathyroidism of renal origin: Secondary | ICD-10-CM | POA: Diagnosis not present

## 2019-09-18 DIAGNOSIS — D509 Iron deficiency anemia, unspecified: Secondary | ICD-10-CM | POA: Diagnosis not present

## 2019-09-18 DIAGNOSIS — F17210 Nicotine dependence, cigarettes, uncomplicated: Secondary | ICD-10-CM | POA: Diagnosis not present

## 2019-09-18 DIAGNOSIS — N186 End stage renal disease: Secondary | ICD-10-CM | POA: Diagnosis not present

## 2019-09-18 DIAGNOSIS — J449 Chronic obstructive pulmonary disease, unspecified: Secondary | ICD-10-CM | POA: Diagnosis not present

## 2019-09-18 DIAGNOSIS — Z6833 Body mass index (BMI) 33.0-33.9, adult: Secondary | ICD-10-CM | POA: Diagnosis not present

## 2019-09-19 DIAGNOSIS — N2581 Secondary hyperparathyroidism of renal origin: Secondary | ICD-10-CM | POA: Diagnosis not present

## 2019-09-19 DIAGNOSIS — N186 End stage renal disease: Secondary | ICD-10-CM | POA: Diagnosis not present

## 2019-09-19 DIAGNOSIS — D509 Iron deficiency anemia, unspecified: Secondary | ICD-10-CM | POA: Diagnosis not present

## 2019-09-19 DIAGNOSIS — Z992 Dependence on renal dialysis: Secondary | ICD-10-CM | POA: Diagnosis not present

## 2019-09-21 DIAGNOSIS — N186 End stage renal disease: Secondary | ICD-10-CM | POA: Diagnosis not present

## 2019-09-21 DIAGNOSIS — D509 Iron deficiency anemia, unspecified: Secondary | ICD-10-CM | POA: Diagnosis not present

## 2019-09-21 DIAGNOSIS — Z992 Dependence on renal dialysis: Secondary | ICD-10-CM | POA: Diagnosis not present

## 2019-09-21 DIAGNOSIS — N2581 Secondary hyperparathyroidism of renal origin: Secondary | ICD-10-CM | POA: Diagnosis not present

## 2019-09-22 DIAGNOSIS — D631 Anemia in chronic kidney disease: Secondary | ICD-10-CM | POA: Diagnosis not present

## 2019-09-22 DIAGNOSIS — D509 Iron deficiency anemia, unspecified: Secondary | ICD-10-CM | POA: Diagnosis not present

## 2019-09-22 DIAGNOSIS — Z992 Dependence on renal dialysis: Secondary | ICD-10-CM | POA: Diagnosis not present

## 2019-09-22 DIAGNOSIS — N2581 Secondary hyperparathyroidism of renal origin: Secondary | ICD-10-CM | POA: Diagnosis not present

## 2019-09-22 DIAGNOSIS — N186 End stage renal disease: Secondary | ICD-10-CM | POA: Diagnosis not present

## 2019-09-24 DIAGNOSIS — Z992 Dependence on renal dialysis: Secondary | ICD-10-CM | POA: Diagnosis not present

## 2019-09-24 DIAGNOSIS — N186 End stage renal disease: Secondary | ICD-10-CM | POA: Diagnosis not present

## 2019-09-24 DIAGNOSIS — N2581 Secondary hyperparathyroidism of renal origin: Secondary | ICD-10-CM | POA: Diagnosis not present

## 2019-09-24 DIAGNOSIS — D631 Anemia in chronic kidney disease: Secondary | ICD-10-CM | POA: Diagnosis not present

## 2019-09-24 DIAGNOSIS — D509 Iron deficiency anemia, unspecified: Secondary | ICD-10-CM | POA: Diagnosis not present

## 2019-09-26 DIAGNOSIS — N2581 Secondary hyperparathyroidism of renal origin: Secondary | ICD-10-CM | POA: Diagnosis not present

## 2019-09-26 DIAGNOSIS — D509 Iron deficiency anemia, unspecified: Secondary | ICD-10-CM | POA: Diagnosis not present

## 2019-09-26 DIAGNOSIS — Z992 Dependence on renal dialysis: Secondary | ICD-10-CM | POA: Diagnosis not present

## 2019-09-26 DIAGNOSIS — D631 Anemia in chronic kidney disease: Secondary | ICD-10-CM | POA: Diagnosis not present

## 2019-09-26 DIAGNOSIS — N186 End stage renal disease: Secondary | ICD-10-CM | POA: Diagnosis not present

## 2019-09-28 DIAGNOSIS — Z992 Dependence on renal dialysis: Secondary | ICD-10-CM | POA: Diagnosis not present

## 2019-09-28 DIAGNOSIS — D631 Anemia in chronic kidney disease: Secondary | ICD-10-CM | POA: Diagnosis not present

## 2019-09-28 DIAGNOSIS — D509 Iron deficiency anemia, unspecified: Secondary | ICD-10-CM | POA: Diagnosis not present

## 2019-09-28 DIAGNOSIS — N2581 Secondary hyperparathyroidism of renal origin: Secondary | ICD-10-CM | POA: Diagnosis not present

## 2019-09-28 DIAGNOSIS — N186 End stage renal disease: Secondary | ICD-10-CM | POA: Diagnosis not present

## 2019-10-01 DIAGNOSIS — D631 Anemia in chronic kidney disease: Secondary | ICD-10-CM | POA: Diagnosis not present

## 2019-10-01 DIAGNOSIS — Z992 Dependence on renal dialysis: Secondary | ICD-10-CM | POA: Diagnosis not present

## 2019-10-01 DIAGNOSIS — D509 Iron deficiency anemia, unspecified: Secondary | ICD-10-CM | POA: Diagnosis not present

## 2019-10-01 DIAGNOSIS — N2581 Secondary hyperparathyroidism of renal origin: Secondary | ICD-10-CM | POA: Diagnosis not present

## 2019-10-01 DIAGNOSIS — N186 End stage renal disease: Secondary | ICD-10-CM | POA: Diagnosis not present

## 2019-10-03 DIAGNOSIS — N186 End stage renal disease: Secondary | ICD-10-CM | POA: Diagnosis not present

## 2019-10-03 DIAGNOSIS — Z992 Dependence on renal dialysis: Secondary | ICD-10-CM | POA: Diagnosis not present

## 2019-10-03 DIAGNOSIS — N2581 Secondary hyperparathyroidism of renal origin: Secondary | ICD-10-CM | POA: Diagnosis not present

## 2019-10-03 DIAGNOSIS — D509 Iron deficiency anemia, unspecified: Secondary | ICD-10-CM | POA: Diagnosis not present

## 2019-10-03 DIAGNOSIS — D631 Anemia in chronic kidney disease: Secondary | ICD-10-CM | POA: Diagnosis not present

## 2019-10-05 DIAGNOSIS — D631 Anemia in chronic kidney disease: Secondary | ICD-10-CM | POA: Diagnosis not present

## 2019-10-05 DIAGNOSIS — Z992 Dependence on renal dialysis: Secondary | ICD-10-CM | POA: Diagnosis not present

## 2019-10-05 DIAGNOSIS — N2581 Secondary hyperparathyroidism of renal origin: Secondary | ICD-10-CM | POA: Diagnosis not present

## 2019-10-05 DIAGNOSIS — N186 End stage renal disease: Secondary | ICD-10-CM | POA: Diagnosis not present

## 2019-10-05 DIAGNOSIS — D509 Iron deficiency anemia, unspecified: Secondary | ICD-10-CM | POA: Diagnosis not present

## 2019-10-08 DIAGNOSIS — D509 Iron deficiency anemia, unspecified: Secondary | ICD-10-CM | POA: Diagnosis not present

## 2019-10-08 DIAGNOSIS — Z992 Dependence on renal dialysis: Secondary | ICD-10-CM | POA: Diagnosis not present

## 2019-10-08 DIAGNOSIS — N186 End stage renal disease: Secondary | ICD-10-CM | POA: Diagnosis not present

## 2019-10-08 DIAGNOSIS — N2581 Secondary hyperparathyroidism of renal origin: Secondary | ICD-10-CM | POA: Diagnosis not present

## 2019-10-08 DIAGNOSIS — D631 Anemia in chronic kidney disease: Secondary | ICD-10-CM | POA: Diagnosis not present

## 2019-10-09 ENCOUNTER — Other Ambulatory Visit: Payer: Self-pay | Admitting: Neurology

## 2019-10-09 ENCOUNTER — Other Ambulatory Visit (HOSPITAL_COMMUNITY): Payer: Self-pay | Admitting: Neurology

## 2019-10-09 DIAGNOSIS — Z8679 Personal history of other diseases of the circulatory system: Secondary | ICD-10-CM | POA: Diagnosis not present

## 2019-10-09 DIAGNOSIS — M25562 Pain in left knee: Secondary | ICD-10-CM | POA: Diagnosis not present

## 2019-10-09 DIAGNOSIS — Z79891 Long term (current) use of opiate analgesic: Secondary | ICD-10-CM | POA: Diagnosis not present

## 2019-10-09 DIAGNOSIS — M25569 Pain in unspecified knee: Secondary | ICD-10-CM | POA: Diagnosis not present

## 2019-10-09 DIAGNOSIS — M25512 Pain in left shoulder: Secondary | ICD-10-CM | POA: Diagnosis not present

## 2019-10-09 DIAGNOSIS — M545 Low back pain: Secondary | ICD-10-CM | POA: Diagnosis not present

## 2019-10-09 DIAGNOSIS — N08 Glomerular disorders in diseases classified elsewhere: Secondary | ICD-10-CM | POA: Diagnosis not present

## 2019-10-09 DIAGNOSIS — M25561 Pain in right knee: Secondary | ICD-10-CM

## 2019-10-10 DIAGNOSIS — D631 Anemia in chronic kidney disease: Secondary | ICD-10-CM | POA: Diagnosis not present

## 2019-10-10 DIAGNOSIS — N186 End stage renal disease: Secondary | ICD-10-CM | POA: Diagnosis not present

## 2019-10-10 DIAGNOSIS — D509 Iron deficiency anemia, unspecified: Secondary | ICD-10-CM | POA: Diagnosis not present

## 2019-10-10 DIAGNOSIS — N2581 Secondary hyperparathyroidism of renal origin: Secondary | ICD-10-CM | POA: Diagnosis not present

## 2019-10-10 DIAGNOSIS — Z992 Dependence on renal dialysis: Secondary | ICD-10-CM | POA: Diagnosis not present

## 2019-10-12 DIAGNOSIS — D509 Iron deficiency anemia, unspecified: Secondary | ICD-10-CM | POA: Diagnosis not present

## 2019-10-12 DIAGNOSIS — N2581 Secondary hyperparathyroidism of renal origin: Secondary | ICD-10-CM | POA: Diagnosis not present

## 2019-10-12 DIAGNOSIS — D631 Anemia in chronic kidney disease: Secondary | ICD-10-CM | POA: Diagnosis not present

## 2019-10-12 DIAGNOSIS — Z992 Dependence on renal dialysis: Secondary | ICD-10-CM | POA: Diagnosis not present

## 2019-10-12 DIAGNOSIS — N186 End stage renal disease: Secondary | ICD-10-CM | POA: Diagnosis not present

## 2019-10-15 DIAGNOSIS — D509 Iron deficiency anemia, unspecified: Secondary | ICD-10-CM | POA: Diagnosis not present

## 2019-10-15 DIAGNOSIS — D631 Anemia in chronic kidney disease: Secondary | ICD-10-CM | POA: Diagnosis not present

## 2019-10-15 DIAGNOSIS — N186 End stage renal disease: Secondary | ICD-10-CM | POA: Diagnosis not present

## 2019-10-15 DIAGNOSIS — Z992 Dependence on renal dialysis: Secondary | ICD-10-CM | POA: Diagnosis not present

## 2019-10-15 DIAGNOSIS — N2581 Secondary hyperparathyroidism of renal origin: Secondary | ICD-10-CM | POA: Diagnosis not present

## 2019-10-17 DIAGNOSIS — D631 Anemia in chronic kidney disease: Secondary | ICD-10-CM | POA: Diagnosis not present

## 2019-10-17 DIAGNOSIS — Z992 Dependence on renal dialysis: Secondary | ICD-10-CM | POA: Diagnosis not present

## 2019-10-17 DIAGNOSIS — N2581 Secondary hyperparathyroidism of renal origin: Secondary | ICD-10-CM | POA: Diagnosis not present

## 2019-10-17 DIAGNOSIS — D509 Iron deficiency anemia, unspecified: Secondary | ICD-10-CM | POA: Diagnosis not present

## 2019-10-17 DIAGNOSIS — N186 End stage renal disease: Secondary | ICD-10-CM | POA: Diagnosis not present

## 2019-10-18 DIAGNOSIS — N2581 Secondary hyperparathyroidism of renal origin: Secondary | ICD-10-CM | POA: Diagnosis not present

## 2019-10-18 DIAGNOSIS — D509 Iron deficiency anemia, unspecified: Secondary | ICD-10-CM | POA: Diagnosis not present

## 2019-10-18 DIAGNOSIS — D631 Anemia in chronic kidney disease: Secondary | ICD-10-CM | POA: Diagnosis not present

## 2019-10-18 DIAGNOSIS — Z992 Dependence on renal dialysis: Secondary | ICD-10-CM | POA: Diagnosis not present

## 2019-10-18 DIAGNOSIS — N186 End stage renal disease: Secondary | ICD-10-CM | POA: Diagnosis not present

## 2019-10-19 DIAGNOSIS — N2581 Secondary hyperparathyroidism of renal origin: Secondary | ICD-10-CM | POA: Diagnosis not present

## 2019-10-19 DIAGNOSIS — D631 Anemia in chronic kidney disease: Secondary | ICD-10-CM | POA: Diagnosis not present

## 2019-10-19 DIAGNOSIS — N186 End stage renal disease: Secondary | ICD-10-CM | POA: Diagnosis not present

## 2019-10-19 DIAGNOSIS — Z992 Dependence on renal dialysis: Secondary | ICD-10-CM | POA: Diagnosis not present

## 2019-10-19 DIAGNOSIS — D509 Iron deficiency anemia, unspecified: Secondary | ICD-10-CM | POA: Diagnosis not present

## 2019-10-22 DIAGNOSIS — N186 End stage renal disease: Secondary | ICD-10-CM | POA: Diagnosis not present

## 2019-10-22 DIAGNOSIS — Z992 Dependence on renal dialysis: Secondary | ICD-10-CM | POA: Diagnosis not present

## 2019-10-22 DIAGNOSIS — D631 Anemia in chronic kidney disease: Secondary | ICD-10-CM | POA: Diagnosis not present

## 2019-10-22 DIAGNOSIS — N2581 Secondary hyperparathyroidism of renal origin: Secondary | ICD-10-CM | POA: Diagnosis not present

## 2019-10-22 DIAGNOSIS — D509 Iron deficiency anemia, unspecified: Secondary | ICD-10-CM | POA: Diagnosis not present

## 2019-10-23 DIAGNOSIS — Z992 Dependence on renal dialysis: Secondary | ICD-10-CM | POA: Diagnosis not present

## 2019-10-23 DIAGNOSIS — N186 End stage renal disease: Secondary | ICD-10-CM | POA: Diagnosis not present

## 2019-10-24 DIAGNOSIS — Z992 Dependence on renal dialysis: Secondary | ICD-10-CM | POA: Diagnosis not present

## 2019-10-24 DIAGNOSIS — N186 End stage renal disease: Secondary | ICD-10-CM | POA: Diagnosis not present

## 2019-10-26 DIAGNOSIS — N186 End stage renal disease: Secondary | ICD-10-CM | POA: Diagnosis not present

## 2019-10-26 DIAGNOSIS — Z992 Dependence on renal dialysis: Secondary | ICD-10-CM | POA: Diagnosis not present

## 2019-10-29 DIAGNOSIS — N2581 Secondary hyperparathyroidism of renal origin: Secondary | ICD-10-CM | POA: Diagnosis not present

## 2019-10-29 DIAGNOSIS — N186 End stage renal disease: Secondary | ICD-10-CM | POA: Diagnosis not present

## 2019-10-29 DIAGNOSIS — Z992 Dependence on renal dialysis: Secondary | ICD-10-CM | POA: Diagnosis not present

## 2019-10-31 DIAGNOSIS — Z992 Dependence on renal dialysis: Secondary | ICD-10-CM | POA: Diagnosis not present

## 2019-10-31 DIAGNOSIS — N186 End stage renal disease: Secondary | ICD-10-CM | POA: Diagnosis not present

## 2019-11-02 DIAGNOSIS — Z992 Dependence on renal dialysis: Secondary | ICD-10-CM | POA: Diagnosis not present

## 2019-11-02 DIAGNOSIS — N186 End stage renal disease: Secondary | ICD-10-CM | POA: Diagnosis not present

## 2019-11-05 DIAGNOSIS — D509 Iron deficiency anemia, unspecified: Secondary | ICD-10-CM | POA: Diagnosis not present

## 2019-11-05 DIAGNOSIS — N2581 Secondary hyperparathyroidism of renal origin: Secondary | ICD-10-CM | POA: Diagnosis not present

## 2019-11-05 DIAGNOSIS — N186 End stage renal disease: Secondary | ICD-10-CM | POA: Diagnosis not present

## 2019-11-05 DIAGNOSIS — Z992 Dependence on renal dialysis: Secondary | ICD-10-CM | POA: Diagnosis not present

## 2019-11-06 DIAGNOSIS — Z79891 Long term (current) use of opiate analgesic: Secondary | ICD-10-CM | POA: Diagnosis not present

## 2019-11-06 DIAGNOSIS — M25512 Pain in left shoulder: Secondary | ICD-10-CM | POA: Diagnosis not present

## 2019-11-06 DIAGNOSIS — M25562 Pain in left knee: Secondary | ICD-10-CM | POA: Diagnosis not present

## 2019-11-06 DIAGNOSIS — M25569 Pain in unspecified knee: Secondary | ICD-10-CM | POA: Diagnosis not present

## 2019-11-06 DIAGNOSIS — M545 Low back pain: Secondary | ICD-10-CM | POA: Diagnosis not present

## 2019-11-06 DIAGNOSIS — Z8679 Personal history of other diseases of the circulatory system: Secondary | ICD-10-CM | POA: Diagnosis not present

## 2019-11-07 DIAGNOSIS — N186 End stage renal disease: Secondary | ICD-10-CM | POA: Diagnosis not present

## 2019-11-07 DIAGNOSIS — Z992 Dependence on renal dialysis: Secondary | ICD-10-CM | POA: Diagnosis not present

## 2019-11-09 DIAGNOSIS — Z992 Dependence on renal dialysis: Secondary | ICD-10-CM | POA: Diagnosis not present

## 2019-11-09 DIAGNOSIS — N186 End stage renal disease: Secondary | ICD-10-CM | POA: Diagnosis not present

## 2019-11-12 DIAGNOSIS — Z992 Dependence on renal dialysis: Secondary | ICD-10-CM | POA: Diagnosis not present

## 2019-11-12 DIAGNOSIS — N186 End stage renal disease: Secondary | ICD-10-CM | POA: Diagnosis not present

## 2019-11-14 DIAGNOSIS — Z992 Dependence on renal dialysis: Secondary | ICD-10-CM | POA: Diagnosis not present

## 2019-11-14 DIAGNOSIS — N186 End stage renal disease: Secondary | ICD-10-CM | POA: Diagnosis not present

## 2019-11-16 DIAGNOSIS — N186 End stage renal disease: Secondary | ICD-10-CM | POA: Diagnosis not present

## 2019-11-16 DIAGNOSIS — Z992 Dependence on renal dialysis: Secondary | ICD-10-CM | POA: Diagnosis not present

## 2019-11-17 DIAGNOSIS — Z992 Dependence on renal dialysis: Secondary | ICD-10-CM | POA: Diagnosis not present

## 2019-11-17 DIAGNOSIS — N186 End stage renal disease: Secondary | ICD-10-CM | POA: Diagnosis not present

## 2019-11-19 DIAGNOSIS — Z992 Dependence on renal dialysis: Secondary | ICD-10-CM | POA: Diagnosis not present

## 2019-11-19 DIAGNOSIS — N186 End stage renal disease: Secondary | ICD-10-CM | POA: Diagnosis not present

## 2019-11-21 DIAGNOSIS — N186 End stage renal disease: Secondary | ICD-10-CM | POA: Diagnosis not present

## 2019-11-21 DIAGNOSIS — Z992 Dependence on renal dialysis: Secondary | ICD-10-CM | POA: Diagnosis not present

## 2019-11-22 DIAGNOSIS — Z992 Dependence on renal dialysis: Secondary | ICD-10-CM | POA: Diagnosis not present

## 2019-11-22 DIAGNOSIS — N186 End stage renal disease: Secondary | ICD-10-CM | POA: Diagnosis not present

## 2019-11-23 DIAGNOSIS — Z992 Dependence on renal dialysis: Secondary | ICD-10-CM | POA: Diagnosis not present

## 2019-11-23 DIAGNOSIS — N186 End stage renal disease: Secondary | ICD-10-CM | POA: Diagnosis not present

## 2019-11-26 DIAGNOSIS — N186 End stage renal disease: Secondary | ICD-10-CM | POA: Diagnosis not present

## 2019-11-26 DIAGNOSIS — Z992 Dependence on renal dialysis: Secondary | ICD-10-CM | POA: Diagnosis not present

## 2019-11-28 DIAGNOSIS — N186 End stage renal disease: Secondary | ICD-10-CM | POA: Diagnosis not present

## 2019-11-28 DIAGNOSIS — Z992 Dependence on renal dialysis: Secondary | ICD-10-CM | POA: Diagnosis not present

## 2019-11-30 DIAGNOSIS — N186 End stage renal disease: Secondary | ICD-10-CM | POA: Diagnosis not present

## 2019-11-30 DIAGNOSIS — Z992 Dependence on renal dialysis: Secondary | ICD-10-CM | POA: Diagnosis not present

## 2019-12-03 DIAGNOSIS — N2581 Secondary hyperparathyroidism of renal origin: Secondary | ICD-10-CM | POA: Diagnosis not present

## 2019-12-03 DIAGNOSIS — N186 End stage renal disease: Secondary | ICD-10-CM | POA: Diagnosis not present

## 2019-12-03 DIAGNOSIS — Z992 Dependence on renal dialysis: Secondary | ICD-10-CM | POA: Diagnosis not present

## 2019-12-03 DIAGNOSIS — D509 Iron deficiency anemia, unspecified: Secondary | ICD-10-CM | POA: Diagnosis not present

## 2019-12-05 DIAGNOSIS — N186 End stage renal disease: Secondary | ICD-10-CM | POA: Diagnosis not present

## 2019-12-05 DIAGNOSIS — Z992 Dependence on renal dialysis: Secondary | ICD-10-CM | POA: Diagnosis not present

## 2019-12-07 DIAGNOSIS — Z992 Dependence on renal dialysis: Secondary | ICD-10-CM | POA: Diagnosis not present

## 2019-12-07 DIAGNOSIS — N186 End stage renal disease: Secondary | ICD-10-CM | POA: Diagnosis not present

## 2019-12-10 DIAGNOSIS — Z992 Dependence on renal dialysis: Secondary | ICD-10-CM | POA: Diagnosis not present

## 2019-12-10 DIAGNOSIS — N186 End stage renal disease: Secondary | ICD-10-CM | POA: Diagnosis not present

## 2019-12-11 ENCOUNTER — Ambulatory Visit (INDEPENDENT_AMBULATORY_CARE_PROVIDER_SITE_OTHER): Payer: Medicare Other | Admitting: Orthopedic Surgery

## 2019-12-11 ENCOUNTER — Other Ambulatory Visit: Payer: Self-pay

## 2019-12-11 ENCOUNTER — Encounter: Payer: Self-pay | Admitting: Orthopedic Surgery

## 2019-12-11 VITALS — BP 140/92 | HR 100 | Ht 72.0 in | Wt 250.0 lb

## 2019-12-11 DIAGNOSIS — F17210 Nicotine dependence, cigarettes, uncomplicated: Secondary | ICD-10-CM | POA: Diagnosis not present

## 2019-12-11 DIAGNOSIS — I120 Hypertensive chronic kidney disease with stage 5 chronic kidney disease or end stage renal disease: Secondary | ICD-10-CM | POA: Diagnosis not present

## 2019-12-11 DIAGNOSIS — M25462 Effusion, left knee: Secondary | ICD-10-CM

## 2019-12-11 DIAGNOSIS — D519 Vitamin B12 deficiency anemia, unspecified: Secondary | ICD-10-CM | POA: Diagnosis not present

## 2019-12-11 DIAGNOSIS — I700 Atherosclerosis of aorta: Secondary | ICD-10-CM | POA: Diagnosis not present

## 2019-12-11 DIAGNOSIS — N200 Calculus of kidney: Secondary | ICD-10-CM | POA: Diagnosis not present

## 2019-12-11 DIAGNOSIS — R5382 Chronic fatigue, unspecified: Secondary | ICD-10-CM | POA: Diagnosis not present

## 2019-12-11 DIAGNOSIS — M1712 Unilateral primary osteoarthritis, left knee: Secondary | ICD-10-CM

## 2019-12-11 DIAGNOSIS — Z992 Dependence on renal dialysis: Secondary | ICD-10-CM | POA: Diagnosis not present

## 2019-12-11 DIAGNOSIS — E559 Vitamin D deficiency, unspecified: Secondary | ICD-10-CM | POA: Diagnosis not present

## 2019-12-11 DIAGNOSIS — F1011 Alcohol abuse, in remission: Secondary | ICD-10-CM | POA: Diagnosis not present

## 2019-12-11 DIAGNOSIS — J449 Chronic obstructive pulmonary disease, unspecified: Secondary | ICD-10-CM | POA: Diagnosis not present

## 2019-12-11 DIAGNOSIS — N186 End stage renal disease: Secondary | ICD-10-CM | POA: Diagnosis not present

## 2019-12-11 DIAGNOSIS — M1710 Unilateral primary osteoarthritis, unspecified knee: Secondary | ICD-10-CM

## 2019-12-11 NOTE — Patient Instructions (Signed)

## 2019-12-11 NOTE — Progress Notes (Signed)
Patient ID: Brian Barrera, male   DOB: Dec 02, 1970, 49 y.o.   MRN: [id number]  Chief Complaint  Patient presents with  . Knee Pain    left injury 1990 playing basketball now pain worse past few years     49 year old male who says he injured his knee several years ago and had pain which is now gotten worse.  He relates his injury to 55.  He says over the last year or so his pain is increased.  He complains of posterior and anterolateral and occasional medial joint line pain  He has a history of diastolic heart failure ejection fraction 35% COPD end-stage renal disease requiring dialysis hypertension   Review of Systems  Musculoskeletal: Positive for back pain, joint pain and myalgias.  All other systems reviewed and are negative.   Past Medical History:  Diagnosis Date  . Anemia    Attributed to chronic disease/chronic kidney disease  . Chronic diastolic heart failure ([place]) 2011   EF 35% by echo in 2011; echo in 08/2011-normal EF, moderate to severe LVH; BNP level of [phone] in 2011-12  . Chronic obstructive pulmonary disease ([place])    with asthmatic component  . ESRD (end stage renal disease) on dialysis [facility])    secondary hyperparathyroidism  . Hypertension    h/o hypertensive crisis with encephalopathy  . Obesity   . Tobacco abuse    Approximate consumption of 25 pack years; continuing at 0.5 pack per day    Past Surgical History:  Procedure Laterality Date  . ARTERIOVENOUS GRAFT PLACEMENT  2011  . COLONOSCOPY N/A 02/04/2014   Procedure: COLONOSCOPY;  Surgeon: [name], MD;  [facility];  Service: Endoscopy;  Laterality: N/A;  12:00  . COLONOSCOPY N/A 07/03/2019   Procedure: COLONOSCOPY;  Surgeon: [name], MD;  [facility];  Service: Endoscopy;  Laterality: N/A;  12:00-office rescheduled 2/9 @ 11:00am  . POLYPECTOMY  07/03/2019   Procedure: POLYPECTOMY;  Surgeon: [name], MD;  [facility];  Service: Endoscopy;;     PHYSICAL EXAM  BP (!) 140/92   Pulse 100   Ht 6' (1.829 m)   Wt 250 lb (113.4 kg)   BMI 33.91 kg/m  GENERAL appearance reveals no gross abnormalities, normal development grooming and hygiene   MENTAL STATUS we note that the patient is awake alert and oriented to person place and time MOOD/AFFECT ARE NORMAL   GAIT reveals mild  limp in the effected limb  Left Knee Exam   Muscle Strength  The patient has normal left knee strength.  Tenderness  The patient is experiencing tenderness in the lateral joint line and medial joint line.  Range of Motion  Extension: abnormal  Flexion: abnormal   Tests  Drawer:  Anterior - negative     Posterior - negative  Other  Erythema: absent Scars: absent Sensation: normal Pulse: present Effusion: effusion present     VASC 2+ dorsalis pedis pulse normal capillary refill excellent warmth to the extremity  NEURO normal sensation and no pathologic reflexes  LYMPH deferred noncontributory  MEDICAL DECISION MAKING  A.  Encounter Diagnoses  Name Primary?  . Primary localized osteoarthritis of knee Yes  . Effusion, left knee     B. DATA ANALYSED:    IMAGING: Independent interpretation of images: severe oa mild varus left knee   Orders: none  Outside records reviewed: no  C. MANAGEMENT  Unfortunately this gentleman has too many medical problems which include hemodialysis  kidney failure hypertension COPD heart failure  Not a surgical candidate in any way  Recommend aspiration injection left knee every 3 months as needed   Procedure note injection and aspiration left knee joint  Verbal consent was obtained to aspirate and inject the left knee joint   Timeout was completed to confirm the site of aspiration and injection  An 18-gauge needle was used to aspirate the left knee joint from a suprapatellar lateral approach.  The medications used were 40 mg of Depo-Medrol and 1% lidocaine 3 cc  Anesthesia was provided  by ethyl chloride and the skin was prepped with alcohol.  After cleaning the skin with alcohol an 18-gauge needle was used to aspirate the right knee joint.  We obtained 40 cc of fluid yellow clear  We followed this by injection of 40 mg of Depo-Medrol and 3 cc 1% lidocaine.  There were no complications. A sterile bandage was applied.    No orders of the defined types were placed in this encounter.

## 2019-12-12 DIAGNOSIS — N186 End stage renal disease: Secondary | ICD-10-CM | POA: Diagnosis not present

## 2019-12-12 DIAGNOSIS — Z992 Dependence on renal dialysis: Secondary | ICD-10-CM | POA: Diagnosis not present

## 2019-12-14 DIAGNOSIS — N186 End stage renal disease: Secondary | ICD-10-CM | POA: Diagnosis not present

## 2019-12-14 DIAGNOSIS — Z992 Dependence on renal dialysis: Secondary | ICD-10-CM | POA: Diagnosis not present

## 2019-12-17 DIAGNOSIS — Z992 Dependence on renal dialysis: Secondary | ICD-10-CM | POA: Diagnosis not present

## 2019-12-17 DIAGNOSIS — N2581 Secondary hyperparathyroidism of renal origin: Secondary | ICD-10-CM | POA: Diagnosis not present

## 2019-12-17 DIAGNOSIS — N186 End stage renal disease: Secondary | ICD-10-CM | POA: Diagnosis not present

## 2019-12-18 DIAGNOSIS — I120 Hypertensive chronic kidney disease with stage 5 chronic kidney disease or end stage renal disease: Secondary | ICD-10-CM | POA: Diagnosis not present

## 2019-12-18 DIAGNOSIS — Z992 Dependence on renal dialysis: Secondary | ICD-10-CM | POA: Diagnosis not present

## 2019-12-18 DIAGNOSIS — I700 Atherosclerosis of aorta: Secondary | ICD-10-CM | POA: Diagnosis not present

## 2019-12-18 DIAGNOSIS — J449 Chronic obstructive pulmonary disease, unspecified: Secondary | ICD-10-CM | POA: Diagnosis not present

## 2019-12-18 DIAGNOSIS — Z6833 Body mass index (BMI) 33.0-33.9, adult: Secondary | ICD-10-CM | POA: Diagnosis not present

## 2019-12-18 DIAGNOSIS — E559 Vitamin D deficiency, unspecified: Secondary | ICD-10-CM | POA: Diagnosis not present

## 2019-12-18 DIAGNOSIS — M5136 Other intervertebral disc degeneration, lumbar region: Secondary | ICD-10-CM | POA: Diagnosis not present

## 2019-12-18 DIAGNOSIS — I779 Disorder of arteries and arterioles, unspecified: Secondary | ICD-10-CM | POA: Diagnosis not present

## 2019-12-19 DIAGNOSIS — N186 End stage renal disease: Secondary | ICD-10-CM | POA: Diagnosis not present

## 2019-12-19 DIAGNOSIS — Z992 Dependence on renal dialysis: Secondary | ICD-10-CM | POA: Diagnosis not present

## 2019-12-21 DIAGNOSIS — Z992 Dependence on renal dialysis: Secondary | ICD-10-CM | POA: Diagnosis not present

## 2019-12-21 DIAGNOSIS — N186 End stage renal disease: Secondary | ICD-10-CM | POA: Diagnosis not present

## 2019-12-22 DIAGNOSIS — Z992 Dependence on renal dialysis: Secondary | ICD-10-CM | POA: Diagnosis not present

## 2019-12-22 DIAGNOSIS — N186 End stage renal disease: Secondary | ICD-10-CM | POA: Diagnosis not present

## 2019-12-23 DIAGNOSIS — N186 End stage renal disease: Secondary | ICD-10-CM | POA: Diagnosis not present

## 2019-12-23 DIAGNOSIS — Z992 Dependence on renal dialysis: Secondary | ICD-10-CM | POA: Diagnosis not present

## 2019-12-24 DIAGNOSIS — Z992 Dependence on renal dialysis: Secondary | ICD-10-CM | POA: Diagnosis not present

## 2019-12-24 DIAGNOSIS — N186 End stage renal disease: Secondary | ICD-10-CM | POA: Diagnosis not present

## 2019-12-26 DIAGNOSIS — N186 End stage renal disease: Secondary | ICD-10-CM | POA: Diagnosis not present

## 2019-12-26 DIAGNOSIS — Z992 Dependence on renal dialysis: Secondary | ICD-10-CM | POA: Diagnosis not present

## 2019-12-28 DIAGNOSIS — Z992 Dependence on renal dialysis: Secondary | ICD-10-CM | POA: Diagnosis not present

## 2019-12-28 DIAGNOSIS — N186 End stage renal disease: Secondary | ICD-10-CM | POA: Diagnosis not present

## 2019-12-31 DIAGNOSIS — N186 End stage renal disease: Secondary | ICD-10-CM | POA: Diagnosis not present

## 2019-12-31 DIAGNOSIS — Z992 Dependence on renal dialysis: Secondary | ICD-10-CM | POA: Diagnosis not present

## 2019-12-31 DIAGNOSIS — D509 Iron deficiency anemia, unspecified: Secondary | ICD-10-CM | POA: Diagnosis not present

## 2019-12-31 DIAGNOSIS — N2581 Secondary hyperparathyroidism of renal origin: Secondary | ICD-10-CM | POA: Diagnosis not present

## 2020-01-02 DIAGNOSIS — N186 End stage renal disease: Secondary | ICD-10-CM | POA: Diagnosis not present

## 2020-01-02 DIAGNOSIS — Z992 Dependence on renal dialysis: Secondary | ICD-10-CM | POA: Diagnosis not present

## 2020-01-04 DIAGNOSIS — N186 End stage renal disease: Secondary | ICD-10-CM | POA: Diagnosis not present

## 2020-01-04 DIAGNOSIS — Z992 Dependence on renal dialysis: Secondary | ICD-10-CM | POA: Diagnosis not present

## 2020-01-07 DIAGNOSIS — N186 End stage renal disease: Secondary | ICD-10-CM | POA: Diagnosis not present

## 2020-01-07 DIAGNOSIS — Z992 Dependence on renal dialysis: Secondary | ICD-10-CM | POA: Diagnosis not present

## 2020-01-08 DIAGNOSIS — M25512 Pain in left shoulder: Secondary | ICD-10-CM | POA: Diagnosis not present

## 2020-01-08 DIAGNOSIS — M545 Low back pain: Secondary | ICD-10-CM | POA: Diagnosis not present

## 2020-01-08 DIAGNOSIS — Z79891 Long term (current) use of opiate analgesic: Secondary | ICD-10-CM | POA: Diagnosis not present

## 2020-01-08 DIAGNOSIS — M25569 Pain in unspecified knee: Secondary | ICD-10-CM | POA: Diagnosis not present

## 2020-01-08 DIAGNOSIS — Z8679 Personal history of other diseases of the circulatory system: Secondary | ICD-10-CM | POA: Diagnosis not present

## 2020-01-09 DIAGNOSIS — Z992 Dependence on renal dialysis: Secondary | ICD-10-CM | POA: Diagnosis not present

## 2020-01-09 DIAGNOSIS — N186 End stage renal disease: Secondary | ICD-10-CM | POA: Diagnosis not present

## 2020-01-11 DIAGNOSIS — N186 End stage renal disease: Secondary | ICD-10-CM | POA: Diagnosis not present

## 2020-01-11 DIAGNOSIS — Z992 Dependence on renal dialysis: Secondary | ICD-10-CM | POA: Diagnosis not present

## 2020-01-14 DIAGNOSIS — N186 End stage renal disease: Secondary | ICD-10-CM | POA: Diagnosis not present

## 2020-01-14 DIAGNOSIS — Z992 Dependence on renal dialysis: Secondary | ICD-10-CM | POA: Diagnosis not present

## 2020-01-16 DIAGNOSIS — Z992 Dependence on renal dialysis: Secondary | ICD-10-CM | POA: Diagnosis not present

## 2020-01-16 DIAGNOSIS — N186 End stage renal disease: Secondary | ICD-10-CM | POA: Diagnosis not present

## 2020-01-18 DIAGNOSIS — N186 End stage renal disease: Secondary | ICD-10-CM | POA: Diagnosis not present

## 2020-01-18 DIAGNOSIS — Z992 Dependence on renal dialysis: Secondary | ICD-10-CM | POA: Diagnosis not present

## 2020-01-21 DIAGNOSIS — Z992 Dependence on renal dialysis: Secondary | ICD-10-CM | POA: Diagnosis not present

## 2020-01-21 DIAGNOSIS — N186 End stage renal disease: Secondary | ICD-10-CM | POA: Diagnosis not present

## 2020-01-22 DIAGNOSIS — Z992 Dependence on renal dialysis: Secondary | ICD-10-CM | POA: Diagnosis not present

## 2020-01-22 DIAGNOSIS — N186 End stage renal disease: Secondary | ICD-10-CM | POA: Diagnosis not present

## 2020-01-23 DIAGNOSIS — N186 End stage renal disease: Secondary | ICD-10-CM | POA: Diagnosis not present

## 2020-01-23 DIAGNOSIS — Z992 Dependence on renal dialysis: Secondary | ICD-10-CM | POA: Diagnosis not present

## 2020-01-25 DIAGNOSIS — N186 End stage renal disease: Secondary | ICD-10-CM | POA: Diagnosis not present

## 2020-01-25 DIAGNOSIS — Z992 Dependence on renal dialysis: Secondary | ICD-10-CM | POA: Diagnosis not present

## 2020-01-28 DIAGNOSIS — Z992 Dependence on renal dialysis: Secondary | ICD-10-CM | POA: Diagnosis not present

## 2020-01-28 DIAGNOSIS — N186 End stage renal disease: Secondary | ICD-10-CM | POA: Diagnosis not present

## 2020-01-28 DIAGNOSIS — N2581 Secondary hyperparathyroidism of renal origin: Secondary | ICD-10-CM | POA: Diagnosis not present

## 2020-01-30 DIAGNOSIS — Z992 Dependence on renal dialysis: Secondary | ICD-10-CM | POA: Diagnosis not present

## 2020-01-30 DIAGNOSIS — N186 End stage renal disease: Secondary | ICD-10-CM | POA: Diagnosis not present

## 2020-02-01 DIAGNOSIS — N186 End stage renal disease: Secondary | ICD-10-CM | POA: Diagnosis not present

## 2020-02-01 DIAGNOSIS — Z992 Dependence on renal dialysis: Secondary | ICD-10-CM | POA: Diagnosis not present

## 2020-02-04 DIAGNOSIS — Z992 Dependence on renal dialysis: Secondary | ICD-10-CM | POA: Diagnosis not present

## 2020-02-04 DIAGNOSIS — D509 Iron deficiency anemia, unspecified: Secondary | ICD-10-CM | POA: Diagnosis not present

## 2020-02-04 DIAGNOSIS — N2581 Secondary hyperparathyroidism of renal origin: Secondary | ICD-10-CM | POA: Diagnosis not present

## 2020-02-04 DIAGNOSIS — N186 End stage renal disease: Secondary | ICD-10-CM | POA: Diagnosis not present

## 2020-02-06 DIAGNOSIS — N186 End stage renal disease: Secondary | ICD-10-CM | POA: Diagnosis not present

## 2020-02-06 DIAGNOSIS — Z992 Dependence on renal dialysis: Secondary | ICD-10-CM | POA: Diagnosis not present

## 2020-02-08 DIAGNOSIS — N186 End stage renal disease: Secondary | ICD-10-CM | POA: Diagnosis not present

## 2020-02-08 DIAGNOSIS — Z992 Dependence on renal dialysis: Secondary | ICD-10-CM | POA: Diagnosis not present

## 2020-02-11 DIAGNOSIS — Z992 Dependence on renal dialysis: Secondary | ICD-10-CM | POA: Diagnosis not present

## 2020-02-11 DIAGNOSIS — N186 End stage renal disease: Secondary | ICD-10-CM | POA: Diagnosis not present

## 2020-02-13 DIAGNOSIS — Z992 Dependence on renal dialysis: Secondary | ICD-10-CM | POA: Diagnosis not present

## 2020-02-13 DIAGNOSIS — N186 End stage renal disease: Secondary | ICD-10-CM | POA: Diagnosis not present

## 2020-02-15 DIAGNOSIS — Z992 Dependence on renal dialysis: Secondary | ICD-10-CM | POA: Diagnosis not present

## 2020-02-15 DIAGNOSIS — N186 End stage renal disease: Secondary | ICD-10-CM | POA: Diagnosis not present

## 2020-02-18 DIAGNOSIS — N2581 Secondary hyperparathyroidism of renal origin: Secondary | ICD-10-CM | POA: Diagnosis not present

## 2020-02-18 DIAGNOSIS — Z992 Dependence on renal dialysis: Secondary | ICD-10-CM | POA: Diagnosis not present

## 2020-02-18 DIAGNOSIS — N186 End stage renal disease: Secondary | ICD-10-CM | POA: Diagnosis not present

## 2020-02-20 DIAGNOSIS — N186 End stage renal disease: Secondary | ICD-10-CM | POA: Diagnosis not present

## 2020-02-20 DIAGNOSIS — Z992 Dependence on renal dialysis: Secondary | ICD-10-CM | POA: Diagnosis not present

## 2020-02-21 DIAGNOSIS — Z992 Dependence on renal dialysis: Secondary | ICD-10-CM | POA: Diagnosis not present

## 2020-02-21 DIAGNOSIS — N186 End stage renal disease: Secondary | ICD-10-CM | POA: Diagnosis not present

## 2020-02-22 DIAGNOSIS — Z992 Dependence on renal dialysis: Secondary | ICD-10-CM | POA: Diagnosis not present

## 2020-02-22 DIAGNOSIS — N186 End stage renal disease: Secondary | ICD-10-CM | POA: Diagnosis not present

## 2020-02-22 DIAGNOSIS — Z23 Encounter for immunization: Secondary | ICD-10-CM | POA: Diagnosis not present

## 2020-02-25 DIAGNOSIS — N186 End stage renal disease: Secondary | ICD-10-CM | POA: Diagnosis not present

## 2020-02-25 DIAGNOSIS — Z992 Dependence on renal dialysis: Secondary | ICD-10-CM | POA: Diagnosis not present

## 2020-02-25 DIAGNOSIS — Z23 Encounter for immunization: Secondary | ICD-10-CM | POA: Diagnosis not present

## 2020-02-27 DIAGNOSIS — Z992 Dependence on renal dialysis: Secondary | ICD-10-CM | POA: Diagnosis not present

## 2020-02-27 DIAGNOSIS — N186 End stage renal disease: Secondary | ICD-10-CM | POA: Diagnosis not present

## 2020-02-27 DIAGNOSIS — Z23 Encounter for immunization: Secondary | ICD-10-CM | POA: Diagnosis not present

## 2020-02-29 DIAGNOSIS — N186 End stage renal disease: Secondary | ICD-10-CM | POA: Diagnosis not present

## 2020-02-29 DIAGNOSIS — Z992 Dependence on renal dialysis: Secondary | ICD-10-CM | POA: Diagnosis not present

## 2020-02-29 DIAGNOSIS — Z23 Encounter for immunization: Secondary | ICD-10-CM | POA: Diagnosis not present

## 2020-03-03 DIAGNOSIS — D509 Iron deficiency anemia, unspecified: Secondary | ICD-10-CM | POA: Diagnosis not present

## 2020-03-03 DIAGNOSIS — N186 End stage renal disease: Secondary | ICD-10-CM | POA: Diagnosis not present

## 2020-03-03 DIAGNOSIS — Z992 Dependence on renal dialysis: Secondary | ICD-10-CM | POA: Diagnosis not present

## 2020-03-03 DIAGNOSIS — N2581 Secondary hyperparathyroidism of renal origin: Secondary | ICD-10-CM | POA: Diagnosis not present

## 2020-03-03 DIAGNOSIS — Z23 Encounter for immunization: Secondary | ICD-10-CM | POA: Diagnosis not present

## 2020-03-04 DIAGNOSIS — M545 Low back pain, unspecified: Secondary | ICD-10-CM | POA: Diagnosis not present

## 2020-03-04 DIAGNOSIS — M25562 Pain in left knee: Secondary | ICD-10-CM | POA: Diagnosis not present

## 2020-03-04 DIAGNOSIS — M25569 Pain in unspecified knee: Secondary | ICD-10-CM | POA: Diagnosis not present

## 2020-03-04 DIAGNOSIS — Z79891 Long term (current) use of opiate analgesic: Secondary | ICD-10-CM | POA: Diagnosis not present

## 2020-03-04 DIAGNOSIS — Z8679 Personal history of other diseases of the circulatory system: Secondary | ICD-10-CM | POA: Diagnosis not present

## 2020-03-05 DIAGNOSIS — N186 End stage renal disease: Secondary | ICD-10-CM | POA: Diagnosis not present

## 2020-03-05 DIAGNOSIS — Z23 Encounter for immunization: Secondary | ICD-10-CM | POA: Diagnosis not present

## 2020-03-05 DIAGNOSIS — Z992 Dependence on renal dialysis: Secondary | ICD-10-CM | POA: Diagnosis not present

## 2020-03-07 DIAGNOSIS — Z992 Dependence on renal dialysis: Secondary | ICD-10-CM | POA: Diagnosis not present

## 2020-03-07 DIAGNOSIS — Z23 Encounter for immunization: Secondary | ICD-10-CM | POA: Diagnosis not present

## 2020-03-07 DIAGNOSIS — N186 End stage renal disease: Secondary | ICD-10-CM | POA: Diagnosis not present

## 2020-03-10 DIAGNOSIS — N186 End stage renal disease: Secondary | ICD-10-CM | POA: Diagnosis not present

## 2020-03-10 DIAGNOSIS — Z23 Encounter for immunization: Secondary | ICD-10-CM | POA: Diagnosis not present

## 2020-03-10 DIAGNOSIS — N2581 Secondary hyperparathyroidism of renal origin: Secondary | ICD-10-CM | POA: Diagnosis not present

## 2020-03-10 DIAGNOSIS — Z992 Dependence on renal dialysis: Secondary | ICD-10-CM | POA: Diagnosis not present

## 2020-03-12 DIAGNOSIS — Z23 Encounter for immunization: Secondary | ICD-10-CM | POA: Diagnosis not present

## 2020-03-12 DIAGNOSIS — Z992 Dependence on renal dialysis: Secondary | ICD-10-CM | POA: Diagnosis not present

## 2020-03-12 DIAGNOSIS — N186 End stage renal disease: Secondary | ICD-10-CM | POA: Diagnosis not present

## 2020-03-14 DIAGNOSIS — Z992 Dependence on renal dialysis: Secondary | ICD-10-CM | POA: Diagnosis not present

## 2020-03-14 DIAGNOSIS — N186 End stage renal disease: Secondary | ICD-10-CM | POA: Diagnosis not present

## 2020-03-14 DIAGNOSIS — Z23 Encounter for immunization: Secondary | ICD-10-CM | POA: Diagnosis not present

## 2020-03-16 DIAGNOSIS — Z23 Encounter for immunization: Secondary | ICD-10-CM | POA: Diagnosis not present

## 2020-03-16 DIAGNOSIS — N186 End stage renal disease: Secondary | ICD-10-CM | POA: Diagnosis not present

## 2020-03-16 DIAGNOSIS — Z992 Dependence on renal dialysis: Secondary | ICD-10-CM | POA: Diagnosis not present

## 2020-03-17 DIAGNOSIS — N2581 Secondary hyperparathyroidism of renal origin: Secondary | ICD-10-CM | POA: Diagnosis not present

## 2020-03-17 DIAGNOSIS — Z992 Dependence on renal dialysis: Secondary | ICD-10-CM | POA: Diagnosis not present

## 2020-03-17 DIAGNOSIS — Z23 Encounter for immunization: Secondary | ICD-10-CM | POA: Diagnosis not present

## 2020-03-17 DIAGNOSIS — N186 End stage renal disease: Secondary | ICD-10-CM | POA: Diagnosis not present

## 2020-03-18 DIAGNOSIS — F17210 Nicotine dependence, cigarettes, uncomplicated: Secondary | ICD-10-CM | POA: Diagnosis not present

## 2020-03-18 DIAGNOSIS — Z1331 Encounter for screening for depression: Secondary | ICD-10-CM | POA: Diagnosis not present

## 2020-03-18 DIAGNOSIS — Z6833 Body mass index (BMI) 33.0-33.9, adult: Secondary | ICD-10-CM | POA: Diagnosis not present

## 2020-03-18 DIAGNOSIS — J449 Chronic obstructive pulmonary disease, unspecified: Secondary | ICD-10-CM | POA: Diagnosis not present

## 2020-03-18 DIAGNOSIS — Z992 Dependence on renal dialysis: Secondary | ICD-10-CM | POA: Diagnosis not present

## 2020-03-18 DIAGNOSIS — N186 End stage renal disease: Secondary | ICD-10-CM | POA: Diagnosis not present

## 2020-03-18 DIAGNOSIS — Z1389 Encounter for screening for other disorder: Secondary | ICD-10-CM | POA: Diagnosis not present

## 2020-03-19 DIAGNOSIS — Z23 Encounter for immunization: Secondary | ICD-10-CM | POA: Diagnosis not present

## 2020-03-19 DIAGNOSIS — N186 End stage renal disease: Secondary | ICD-10-CM | POA: Diagnosis not present

## 2020-03-19 DIAGNOSIS — Z992 Dependence on renal dialysis: Secondary | ICD-10-CM | POA: Diagnosis not present

## 2020-03-20 DIAGNOSIS — F17210 Nicotine dependence, cigarettes, uncomplicated: Secondary | ICD-10-CM | POA: Diagnosis not present

## 2020-03-20 DIAGNOSIS — N186 End stage renal disease: Secondary | ICD-10-CM | POA: Diagnosis not present

## 2020-03-20 DIAGNOSIS — Z0001 Encounter for general adult medical examination with abnormal findings: Secondary | ICD-10-CM | POA: Diagnosis not present

## 2020-03-20 DIAGNOSIS — Z6833 Body mass index (BMI) 33.0-33.9, adult: Secondary | ICD-10-CM | POA: Diagnosis not present

## 2020-03-20 DIAGNOSIS — J449 Chronic obstructive pulmonary disease, unspecified: Secondary | ICD-10-CM | POA: Diagnosis not present

## 2020-03-20 DIAGNOSIS — F1011 Alcohol abuse, in remission: Secondary | ICD-10-CM | POA: Diagnosis not present

## 2020-03-21 DIAGNOSIS — Z23 Encounter for immunization: Secondary | ICD-10-CM | POA: Diagnosis not present

## 2020-03-21 DIAGNOSIS — N186 End stage renal disease: Secondary | ICD-10-CM | POA: Diagnosis not present

## 2020-03-21 DIAGNOSIS — Z992 Dependence on renal dialysis: Secondary | ICD-10-CM | POA: Diagnosis not present

## 2020-03-23 DIAGNOSIS — N186 End stage renal disease: Secondary | ICD-10-CM | POA: Diagnosis not present

## 2020-03-23 DIAGNOSIS — Z992 Dependence on renal dialysis: Secondary | ICD-10-CM | POA: Diagnosis not present

## 2020-03-24 DIAGNOSIS — Z992 Dependence on renal dialysis: Secondary | ICD-10-CM | POA: Diagnosis not present

## 2020-03-24 DIAGNOSIS — N186 End stage renal disease: Secondary | ICD-10-CM | POA: Diagnosis not present

## 2020-03-24 DIAGNOSIS — N2581 Secondary hyperparathyroidism of renal origin: Secondary | ICD-10-CM | POA: Diagnosis not present

## 2020-03-26 DIAGNOSIS — N186 End stage renal disease: Secondary | ICD-10-CM | POA: Diagnosis not present

## 2020-03-26 DIAGNOSIS — Z992 Dependence on renal dialysis: Secondary | ICD-10-CM | POA: Diagnosis not present

## 2020-03-28 DIAGNOSIS — N186 End stage renal disease: Secondary | ICD-10-CM | POA: Diagnosis not present

## 2020-03-28 DIAGNOSIS — Z992 Dependence on renal dialysis: Secondary | ICD-10-CM | POA: Diagnosis not present

## 2020-03-31 DIAGNOSIS — N186 End stage renal disease: Secondary | ICD-10-CM | POA: Diagnosis not present

## 2020-03-31 DIAGNOSIS — D509 Iron deficiency anemia, unspecified: Secondary | ICD-10-CM | POA: Diagnosis not present

## 2020-03-31 DIAGNOSIS — N2581 Secondary hyperparathyroidism of renal origin: Secondary | ICD-10-CM | POA: Diagnosis not present

## 2020-03-31 DIAGNOSIS — Z992 Dependence on renal dialysis: Secondary | ICD-10-CM | POA: Diagnosis not present

## 2020-04-02 DIAGNOSIS — N186 End stage renal disease: Secondary | ICD-10-CM | POA: Diagnosis not present

## 2020-04-02 DIAGNOSIS — Z992 Dependence on renal dialysis: Secondary | ICD-10-CM | POA: Diagnosis not present

## 2020-04-03 ENCOUNTER — Encounter: Payer: Self-pay | Admitting: Orthopedic Surgery

## 2020-04-03 ENCOUNTER — Ambulatory Visit (INDEPENDENT_AMBULATORY_CARE_PROVIDER_SITE_OTHER): Payer: Medicare Other | Admitting: Orthopedic Surgery

## 2020-04-03 ENCOUNTER — Other Ambulatory Visit: Payer: Self-pay

## 2020-04-03 VITALS — BP 122/91 | HR 100 | Ht 72.0 in | Wt 250.0 lb

## 2020-04-03 DIAGNOSIS — M1712 Unilateral primary osteoarthritis, left knee: Secondary | ICD-10-CM

## 2020-04-03 DIAGNOSIS — M1710 Unilateral primary osteoarthritis, unspecified knee: Secondary | ICD-10-CM

## 2020-04-03 NOTE — Progress Notes (Addendum)
Encounter Diagnosis  Name Primary?  . Primary localized osteoarthritis of knee Yes    Chief Complaint  Patient presents with  . Knee Pain    left/ injection only helped a couple days and pain returned.     has a past medical history of Anemia, Chronic diastolic heart failure ([place]) (2011), Chronic obstructive pulmonary disease ([place]), ESRD (end stage renal disease) on dialysis ([place]), Hypertension, Obesity, and Tobacco abuse.   49 year old male with end-stage renal disease and pretty severe arthritis in his left knee got an injection of cortisone and he takes oxycodone for pain knee and back presents with worsening pain in his left knee  He would like to try another cortisone injection as well as getting a referral to [facility] for possible knee replacement  Procedure note injection and aspiration left knee joint  Verbal consent was obtained to aspirate and inject the left knee joint   Timeout was completed to confirm the site of aspiration and injection  An 18-gauge needle was used to aspirate the left knee joint from a suprapatellar lateral approach.  The medications used were 40 mg of Depo-Medrol and 1% lidocaine 3 cc  Anesthesia was provided by ethyl chloride and the skin was prepped with alcohol.  After cleaning the skin with alcohol an 18-gauge needle was used to aspirate the right knee joint.  We obtained 55 cc of fluid CLEAR  We followed this by injection of 40 mg of Depo-Medrol and 3 cc 1% lidocaine.  There were no complications. A sterile bandage was applied.   referral

## 2020-04-04 DIAGNOSIS — Z992 Dependence on renal dialysis: Secondary | ICD-10-CM | POA: Diagnosis not present

## 2020-04-04 DIAGNOSIS — N186 End stage renal disease: Secondary | ICD-10-CM | POA: Diagnosis not present

## 2020-04-07 DIAGNOSIS — Z992 Dependence on renal dialysis: Secondary | ICD-10-CM | POA: Diagnosis not present

## 2020-04-07 DIAGNOSIS — N186 End stage renal disease: Secondary | ICD-10-CM | POA: Diagnosis not present

## 2020-04-09 DIAGNOSIS — N186 End stage renal disease: Secondary | ICD-10-CM | POA: Diagnosis not present

## 2020-04-09 DIAGNOSIS — Z992 Dependence on renal dialysis: Secondary | ICD-10-CM | POA: Diagnosis not present

## 2020-04-11 DIAGNOSIS — Z992 Dependence on renal dialysis: Secondary | ICD-10-CM | POA: Diagnosis not present

## 2020-04-11 DIAGNOSIS — N186 End stage renal disease: Secondary | ICD-10-CM | POA: Diagnosis not present

## 2020-04-14 DIAGNOSIS — Z992 Dependence on renal dialysis: Secondary | ICD-10-CM | POA: Diagnosis not present

## 2020-04-14 DIAGNOSIS — N2581 Secondary hyperparathyroidism of renal origin: Secondary | ICD-10-CM | POA: Diagnosis not present

## 2020-04-14 DIAGNOSIS — N186 End stage renal disease: Secondary | ICD-10-CM | POA: Diagnosis not present

## 2020-04-15 DIAGNOSIS — Z992 Dependence on renal dialysis: Secondary | ICD-10-CM | POA: Diagnosis not present

## 2020-04-15 DIAGNOSIS — N186 End stage renal disease: Secondary | ICD-10-CM | POA: Diagnosis not present

## 2020-04-16 DIAGNOSIS — N186 End stage renal disease: Secondary | ICD-10-CM | POA: Diagnosis not present

## 2020-04-16 DIAGNOSIS — Z992 Dependence on renal dialysis: Secondary | ICD-10-CM | POA: Diagnosis not present

## 2020-04-18 DIAGNOSIS — N186 End stage renal disease: Secondary | ICD-10-CM | POA: Diagnosis not present

## 2020-04-18 DIAGNOSIS — Z992 Dependence on renal dialysis: Secondary | ICD-10-CM | POA: Diagnosis not present

## 2020-04-21 DIAGNOSIS — Z992 Dependence on renal dialysis: Secondary | ICD-10-CM | POA: Diagnosis not present

## 2020-04-21 DIAGNOSIS — N186 End stage renal disease: Secondary | ICD-10-CM | POA: Diagnosis not present

## 2020-04-22 DIAGNOSIS — N186 End stage renal disease: Secondary | ICD-10-CM | POA: Diagnosis not present

## 2020-04-22 DIAGNOSIS — Z992 Dependence on renal dialysis: Secondary | ICD-10-CM | POA: Diagnosis not present

## 2020-04-23 DIAGNOSIS — N186 End stage renal disease: Secondary | ICD-10-CM | POA: Diagnosis not present

## 2020-04-23 DIAGNOSIS — Z992 Dependence on renal dialysis: Secondary | ICD-10-CM | POA: Diagnosis not present

## 2020-04-25 DIAGNOSIS — Z992 Dependence on renal dialysis: Secondary | ICD-10-CM | POA: Diagnosis not present

## 2020-04-25 DIAGNOSIS — N186 End stage renal disease: Secondary | ICD-10-CM | POA: Diagnosis not present

## 2020-04-28 DIAGNOSIS — Z992 Dependence on renal dialysis: Secondary | ICD-10-CM | POA: Diagnosis not present

## 2020-04-28 DIAGNOSIS — N2581 Secondary hyperparathyroidism of renal origin: Secondary | ICD-10-CM | POA: Diagnosis not present

## 2020-04-28 DIAGNOSIS — N186 End stage renal disease: Secondary | ICD-10-CM | POA: Diagnosis not present

## 2020-04-29 DIAGNOSIS — M25512 Pain in left shoulder: Secondary | ICD-10-CM | POA: Diagnosis not present

## 2020-04-29 DIAGNOSIS — Z8679 Personal history of other diseases of the circulatory system: Secondary | ICD-10-CM | POA: Diagnosis not present

## 2020-04-29 DIAGNOSIS — M25562 Pain in left knee: Secondary | ICD-10-CM | POA: Diagnosis not present

## 2020-04-29 DIAGNOSIS — Z79891 Long term (current) use of opiate analgesic: Secondary | ICD-10-CM | POA: Diagnosis not present

## 2020-04-29 DIAGNOSIS — M545 Low back pain, unspecified: Secondary | ICD-10-CM | POA: Diagnosis not present

## 2020-04-30 DIAGNOSIS — Z992 Dependence on renal dialysis: Secondary | ICD-10-CM | POA: Diagnosis not present

## 2020-04-30 DIAGNOSIS — N186 End stage renal disease: Secondary | ICD-10-CM | POA: Diagnosis not present

## 2020-05-02 DIAGNOSIS — N186 End stage renal disease: Secondary | ICD-10-CM | POA: Diagnosis not present

## 2020-05-02 DIAGNOSIS — Z992 Dependence on renal dialysis: Secondary | ICD-10-CM | POA: Diagnosis not present

## 2020-05-05 DIAGNOSIS — D509 Iron deficiency anemia, unspecified: Secondary | ICD-10-CM | POA: Diagnosis not present

## 2020-05-05 DIAGNOSIS — N186 End stage renal disease: Secondary | ICD-10-CM | POA: Diagnosis not present

## 2020-05-05 DIAGNOSIS — Z992 Dependence on renal dialysis: Secondary | ICD-10-CM | POA: Diagnosis not present

## 2020-05-05 DIAGNOSIS — N2581 Secondary hyperparathyroidism of renal origin: Secondary | ICD-10-CM | POA: Diagnosis not present

## 2020-05-07 DIAGNOSIS — Z992 Dependence on renal dialysis: Secondary | ICD-10-CM | POA: Diagnosis not present

## 2020-05-07 DIAGNOSIS — N186 End stage renal disease: Secondary | ICD-10-CM | POA: Diagnosis not present

## 2020-05-09 DIAGNOSIS — Z992 Dependence on renal dialysis: Secondary | ICD-10-CM | POA: Diagnosis not present

## 2020-05-09 DIAGNOSIS — N186 End stage renal disease: Secondary | ICD-10-CM | POA: Diagnosis not present

## 2020-05-12 DIAGNOSIS — N186 End stage renal disease: Secondary | ICD-10-CM | POA: Diagnosis not present

## 2020-05-12 DIAGNOSIS — Z992 Dependence on renal dialysis: Secondary | ICD-10-CM | POA: Diagnosis not present

## 2020-05-13 DIAGNOSIS — Z23 Encounter for immunization: Secondary | ICD-10-CM | POA: Diagnosis not present

## 2020-05-14 DIAGNOSIS — N186 End stage renal disease: Secondary | ICD-10-CM | POA: Diagnosis not present

## 2020-05-14 DIAGNOSIS — Z992 Dependence on renal dialysis: Secondary | ICD-10-CM | POA: Diagnosis not present

## 2020-05-15 DIAGNOSIS — N186 End stage renal disease: Secondary | ICD-10-CM | POA: Diagnosis not present

## 2020-05-15 DIAGNOSIS — Z992 Dependence on renal dialysis: Secondary | ICD-10-CM | POA: Diagnosis not present

## 2020-05-16 DIAGNOSIS — Z992 Dependence on renal dialysis: Secondary | ICD-10-CM | POA: Diagnosis not present

## 2020-05-16 DIAGNOSIS — N186 End stage renal disease: Secondary | ICD-10-CM | POA: Diagnosis not present

## 2020-05-19 DIAGNOSIS — N2581 Secondary hyperparathyroidism of renal origin: Secondary | ICD-10-CM | POA: Diagnosis not present

## 2020-05-19 DIAGNOSIS — Z992 Dependence on renal dialysis: Secondary | ICD-10-CM | POA: Diagnosis not present

## 2020-05-19 DIAGNOSIS — N186 End stage renal disease: Secondary | ICD-10-CM | POA: Diagnosis not present

## 2020-05-21 DIAGNOSIS — Z992 Dependence on renal dialysis: Secondary | ICD-10-CM | POA: Diagnosis not present

## 2020-05-21 DIAGNOSIS — N186 End stage renal disease: Secondary | ICD-10-CM | POA: Diagnosis not present

## 2020-05-23 DIAGNOSIS — Z992 Dependence on renal dialysis: Secondary | ICD-10-CM | POA: Diagnosis not present

## 2020-05-23 DIAGNOSIS — N186 End stage renal disease: Secondary | ICD-10-CM | POA: Diagnosis not present

## 2020-05-24 DIAGNOSIS — Z992 Dependence on renal dialysis: Secondary | ICD-10-CM | POA: Diagnosis not present

## 2020-05-24 DIAGNOSIS — N186 End stage renal disease: Secondary | ICD-10-CM | POA: Diagnosis not present

## 2020-05-26 DIAGNOSIS — Z992 Dependence on renal dialysis: Secondary | ICD-10-CM | POA: Diagnosis not present

## 2020-05-26 DIAGNOSIS — N186 End stage renal disease: Secondary | ICD-10-CM | POA: Diagnosis not present

## 2020-05-28 DIAGNOSIS — N186 End stage renal disease: Secondary | ICD-10-CM | POA: Diagnosis not present

## 2020-05-28 DIAGNOSIS — Z992 Dependence on renal dialysis: Secondary | ICD-10-CM | POA: Diagnosis not present

## 2020-05-30 DIAGNOSIS — N186 End stage renal disease: Secondary | ICD-10-CM | POA: Diagnosis not present

## 2020-05-30 DIAGNOSIS — Z992 Dependence on renal dialysis: Secondary | ICD-10-CM | POA: Diagnosis not present

## 2020-06-02 DIAGNOSIS — N2581 Secondary hyperparathyroidism of renal origin: Secondary | ICD-10-CM | POA: Diagnosis not present

## 2020-06-02 DIAGNOSIS — N186 End stage renal disease: Secondary | ICD-10-CM | POA: Diagnosis not present

## 2020-06-02 DIAGNOSIS — D509 Iron deficiency anemia, unspecified: Secondary | ICD-10-CM | POA: Diagnosis not present

## 2020-06-02 DIAGNOSIS — Z992 Dependence on renal dialysis: Secondary | ICD-10-CM | POA: Diagnosis not present

## 2020-06-04 DIAGNOSIS — Z992 Dependence on renal dialysis: Secondary | ICD-10-CM | POA: Diagnosis not present

## 2020-06-04 DIAGNOSIS — N186 End stage renal disease: Secondary | ICD-10-CM | POA: Diagnosis not present

## 2020-06-06 DIAGNOSIS — N186 End stage renal disease: Secondary | ICD-10-CM | POA: Diagnosis not present

## 2020-06-06 DIAGNOSIS — Z992 Dependence on renal dialysis: Secondary | ICD-10-CM | POA: Diagnosis not present

## 2020-06-09 DIAGNOSIS — Z992 Dependence on renal dialysis: Secondary | ICD-10-CM | POA: Diagnosis not present

## 2020-06-09 DIAGNOSIS — N186 End stage renal disease: Secondary | ICD-10-CM | POA: Diagnosis not present

## 2020-06-11 DIAGNOSIS — N186 End stage renal disease: Secondary | ICD-10-CM | POA: Diagnosis not present

## 2020-06-11 DIAGNOSIS — Z992 Dependence on renal dialysis: Secondary | ICD-10-CM | POA: Diagnosis not present

## 2020-06-13 DIAGNOSIS — N186 End stage renal disease: Secondary | ICD-10-CM | POA: Diagnosis not present

## 2020-06-13 DIAGNOSIS — Z992 Dependence on renal dialysis: Secondary | ICD-10-CM | POA: Diagnosis not present

## 2020-06-14 DIAGNOSIS — N186 End stage renal disease: Secondary | ICD-10-CM | POA: Diagnosis not present

## 2020-06-14 DIAGNOSIS — Z992 Dependence on renal dialysis: Secondary | ICD-10-CM | POA: Diagnosis not present

## 2020-06-16 DIAGNOSIS — Z992 Dependence on renal dialysis: Secondary | ICD-10-CM | POA: Diagnosis not present

## 2020-06-16 DIAGNOSIS — N186 End stage renal disease: Secondary | ICD-10-CM | POA: Diagnosis not present

## 2020-06-16 DIAGNOSIS — N2581 Secondary hyperparathyroidism of renal origin: Secondary | ICD-10-CM | POA: Diagnosis not present

## 2020-06-18 DIAGNOSIS — Z992 Dependence on renal dialysis: Secondary | ICD-10-CM | POA: Diagnosis not present

## 2020-06-18 DIAGNOSIS — N186 End stage renal disease: Secondary | ICD-10-CM | POA: Diagnosis not present

## 2020-06-19 DIAGNOSIS — Z13220 Encounter for screening for lipoid disorders: Secondary | ICD-10-CM | POA: Diagnosis not present

## 2020-06-19 DIAGNOSIS — E782 Mixed hyperlipidemia: Secondary | ICD-10-CM | POA: Diagnosis not present

## 2020-06-19 DIAGNOSIS — R5382 Chronic fatigue, unspecified: Secondary | ICD-10-CM | POA: Diagnosis not present

## 2020-06-19 DIAGNOSIS — E559 Vitamin D deficiency, unspecified: Secondary | ICD-10-CM | POA: Diagnosis not present

## 2020-06-19 DIAGNOSIS — D519 Vitamin B12 deficiency anemia, unspecified: Secondary | ICD-10-CM | POA: Diagnosis not present

## 2020-06-20 DIAGNOSIS — Z992 Dependence on renal dialysis: Secondary | ICD-10-CM | POA: Diagnosis not present

## 2020-06-20 DIAGNOSIS — N186 End stage renal disease: Secondary | ICD-10-CM | POA: Diagnosis not present

## 2020-06-23 DIAGNOSIS — Z992 Dependence on renal dialysis: Secondary | ICD-10-CM | POA: Diagnosis not present

## 2020-06-23 DIAGNOSIS — N186 End stage renal disease: Secondary | ICD-10-CM | POA: Diagnosis not present

## 2020-06-24 DIAGNOSIS — M545 Low back pain, unspecified: Secondary | ICD-10-CM | POA: Diagnosis not present

## 2020-06-24 DIAGNOSIS — M25562 Pain in left knee: Secondary | ICD-10-CM | POA: Diagnosis not present

## 2020-06-24 DIAGNOSIS — Z8679 Personal history of other diseases of the circulatory system: Secondary | ICD-10-CM | POA: Diagnosis not present

## 2020-06-24 DIAGNOSIS — M25512 Pain in left shoulder: Secondary | ICD-10-CM | POA: Diagnosis not present

## 2020-06-24 DIAGNOSIS — M25569 Pain in unspecified knee: Secondary | ICD-10-CM | POA: Diagnosis not present

## 2020-06-24 DIAGNOSIS — N08 Glomerular disorders in diseases classified elsewhere: Secondary | ICD-10-CM | POA: Diagnosis not present

## 2020-06-24 DIAGNOSIS — N186 End stage renal disease: Secondary | ICD-10-CM | POA: Diagnosis not present

## 2020-06-24 DIAGNOSIS — Z992 Dependence on renal dialysis: Secondary | ICD-10-CM | POA: Diagnosis not present

## 2020-06-24 DIAGNOSIS — Z79891 Long term (current) use of opiate analgesic: Secondary | ICD-10-CM | POA: Diagnosis not present

## 2020-06-25 DIAGNOSIS — N186 End stage renal disease: Secondary | ICD-10-CM | POA: Diagnosis not present

## 2020-06-25 DIAGNOSIS — Z992 Dependence on renal dialysis: Secondary | ICD-10-CM | POA: Diagnosis not present

## 2020-06-26 DIAGNOSIS — F17210 Nicotine dependence, cigarettes, uncomplicated: Secondary | ICD-10-CM | POA: Diagnosis not present

## 2020-06-26 DIAGNOSIS — J449 Chronic obstructive pulmonary disease, unspecified: Secondary | ICD-10-CM | POA: Diagnosis not present

## 2020-06-26 DIAGNOSIS — Z992 Dependence on renal dialysis: Secondary | ICD-10-CM | POA: Diagnosis not present

## 2020-06-26 DIAGNOSIS — I700 Atherosclerosis of aorta: Secondary | ICD-10-CM | POA: Diagnosis not present

## 2020-06-26 DIAGNOSIS — N186 End stage renal disease: Secondary | ICD-10-CM | POA: Diagnosis not present

## 2020-06-26 DIAGNOSIS — E7849 Other hyperlipidemia: Secondary | ICD-10-CM | POA: Diagnosis not present

## 2020-06-26 DIAGNOSIS — I120 Hypertensive chronic kidney disease with stage 5 chronic kidney disease or end stage renal disease: Secondary | ICD-10-CM | POA: Diagnosis not present

## 2020-06-27 DIAGNOSIS — Z992 Dependence on renal dialysis: Secondary | ICD-10-CM | POA: Diagnosis not present

## 2020-06-27 DIAGNOSIS — N186 End stage renal disease: Secondary | ICD-10-CM | POA: Diagnosis not present

## 2020-06-30 DIAGNOSIS — N2581 Secondary hyperparathyroidism of renal origin: Secondary | ICD-10-CM | POA: Diagnosis not present

## 2020-06-30 DIAGNOSIS — N186 End stage renal disease: Secondary | ICD-10-CM | POA: Diagnosis not present

## 2020-06-30 DIAGNOSIS — Z992 Dependence on renal dialysis: Secondary | ICD-10-CM | POA: Diagnosis not present

## 2020-07-02 ENCOUNTER — Emergency Department (HOSPITAL_COMMUNITY): Payer: Medicare Other

## 2020-07-02 ENCOUNTER — Other Ambulatory Visit: Payer: Self-pay

## 2020-07-02 ENCOUNTER — Encounter (HOSPITAL_COMMUNITY): Payer: Self-pay

## 2020-07-02 ENCOUNTER — Emergency Department (HOSPITAL_COMMUNITY)
Admission: EM | Admit: 2020-07-02 | Discharge: 2020-07-02 | Disposition: A | Discharge status: Home / Self Care | Payer: Medicare Other | Attending: Emergency Medicine | Admitting: Emergency Medicine

## 2020-07-02 DIAGNOSIS — J449 Chronic obstructive pulmonary disease, unspecified: Secondary | ICD-10-CM | POA: Insufficient documentation

## 2020-07-02 DIAGNOSIS — Z7982 Long term (current) use of aspirin: Secondary | ICD-10-CM | POA: Insufficient documentation

## 2020-07-02 DIAGNOSIS — F17210 Nicotine dependence, cigarettes, uncomplicated: Secondary | ICD-10-CM | POA: Diagnosis not present

## 2020-07-02 DIAGNOSIS — N186 End stage renal disease: Secondary | ICD-10-CM | POA: Insufficient documentation

## 2020-07-02 DIAGNOSIS — Z7951 Long term (current) use of inhaled steroids: Secondary | ICD-10-CM | POA: Diagnosis not present

## 2020-07-02 DIAGNOSIS — Z79899 Other long term (current) drug therapy: Secondary | ICD-10-CM | POA: Diagnosis not present

## 2020-07-02 DIAGNOSIS — I132 Hypertensive heart and chronic kidney disease with heart failure and with stage 5 chronic kidney disease, or end stage renal disease: Secondary | ICD-10-CM | POA: Diagnosis not present

## 2020-07-02 DIAGNOSIS — I5032 Chronic diastolic (congestive) heart failure: Secondary | ICD-10-CM | POA: Diagnosis not present

## 2020-07-02 DIAGNOSIS — I471 Supraventricular tachycardia: Secondary | ICD-10-CM | POA: Insufficient documentation

## 2020-07-02 DIAGNOSIS — R009 Unspecified abnormalities of heart beat: Secondary | ICD-10-CM | POA: Diagnosis present

## 2020-07-02 DIAGNOSIS — R0689 Other abnormalities of breathing: Secondary | ICD-10-CM | POA: Diagnosis not present

## 2020-07-02 DIAGNOSIS — Z743 Need for continuous supervision: Secondary | ICD-10-CM | POA: Diagnosis not present

## 2020-07-02 DIAGNOSIS — I499 Cardiac arrhythmia, unspecified: Secondary | ICD-10-CM | POA: Diagnosis not present

## 2020-07-02 DIAGNOSIS — Z992 Dependence on renal dialysis: Secondary | ICD-10-CM | POA: Diagnosis not present

## 2020-07-02 DIAGNOSIS — R000 Tachycardia, unspecified: Secondary | ICD-10-CM | POA: Diagnosis not present

## 2020-07-02 DIAGNOSIS — R6889 Other general symptoms and signs: Secondary | ICD-10-CM | POA: Diagnosis not present

## 2020-07-02 LAB — CBC WITH DIFFERENTIAL/PLATELET
Abs Immature Granulocytes: 0.02 10*3/uL (ref 0.00–0.07)
Basophils Absolute: 0.1 10*3/uL (ref 0.0–0.1)
Basophils Relative: 1 %
Eosinophils Absolute: 0.2 10*3/uL (ref 0.0–0.5)
Eosinophils Relative: 2 %
HCT: 58.1 % — ABNORMAL HIGH (ref 39.0–52.0)
Hemoglobin: 17.6 g/dL — ABNORMAL HIGH (ref 13.0–17.0)
Immature Granulocytes: 0 %
Lymphocytes Relative: 24 %
Lymphs Abs: 2.2 10*3/uL (ref 0.7–4.0)
MCH: 30.2 pg (ref 26.0–34.0)
MCHC: 30.3 g/dL (ref 30.0–36.0)
MCV: 99.8 fL (ref 80.0–100.0)
Monocytes Absolute: 0.6 10*3/uL (ref 0.1–1.0)
Monocytes Relative: 7 %
Neutro Abs: 6 10*3/uL (ref 1.7–7.7)
Neutrophils Relative %: 66 %
Platelets: 231 10*3/uL (ref 150–400)
RBC: 5.82 MIL/uL — ABNORMAL HIGH (ref 4.22–5.81)
RDW: 15.8 % — ABNORMAL HIGH (ref 11.5–15.5)
WBC: 9.1 10*3/uL (ref 4.0–10.5)
nRBC: 0 % (ref 0.0–0.2)

## 2020-07-02 LAB — BASIC METABOLIC PANEL
Anion gap: 15 (ref 5–15)
BUN: 38 mg/dL — ABNORMAL HIGH (ref 6–20)
CO2: 25 mmol/L (ref 22–32)
Calcium: 9 mg/dL (ref 8.9–10.3)
Chloride: 97 mmol/L — ABNORMAL LOW (ref 98–111)
Creatinine, Ser: 10.61 mg/dL — ABNORMAL HIGH (ref 0.61–1.24)
GFR, Estimated: 5 mL/min — ABNORMAL LOW (ref >60)
Glucose, Bld: 111 mg/dL — ABNORMAL HIGH (ref 70–99)
Potassium: 4.8 mmol/L (ref 3.5–5.1)
Sodium: 137 mmol/L (ref 135–145)

## 2020-07-02 LAB — MAGNESIUM: Magnesium: 2.9 mg/dL — ABNORMAL HIGH (ref 1.7–2.4)

## 2020-07-02 MED ORDER — ADENOSINE 6 MG/2ML IV SOLN: 6.0000 mg | Freq: Once | INTRAVENOUS | Status: AC

## 2020-07-02 MED ORDER — ADENOSINE 6 MG/2ML IV SOLN
12.0000 mg | Freq: Once | INTRAVENOUS | Status: AC
  Administered 2020-07-02: 12 mg via INTRAVENOUS

## 2020-07-02 MED ORDER — ADENOSINE 6 MG/2ML IV SOLN
INTRAVENOUS | Status: AC
  Administered 2020-07-02: 6 mg via INTRAVENOUS
  Filled 2020-07-02: qty 6

## 2020-07-02 NOTE — Discharge Instructions (Addendum)
You can go back to regular dialysis schedule for your next session on Friday.  Please call Dr. Nelly Laurence office today to ask for a rapid follow up appointment for your ER visit today.

## 2020-07-02 NOTE — ED Provider Notes (Signed)
[facility] EMERGENCY DEPARTMENT Provider Note   CSN: [id number] Arrival date & time: 07/02/20  0846     History Chief Complaint  Patient presents with  . Irregular Heart Beat    KEYANDRE PILEGGI is a 50 y.o. male w/ hx of ESRD on dialysis, CHF, COPD (not on O2), anemia, presenting to ED with arrhythmia. Patient completed about 1/2 of dialysis today regularly scheduled (has not missed any sessions) when HR was noted to be 180's.  He had some palpitations in chest.  EMS brought him to ED.  He denies hx of SVT or arrhythmia or A Fib.  Denies hx of MI.  Reports some mild chest discomfort.  Baseline has some tachycardia, HR 90-100 bpm per review of recent office visits.  Not on beta blockers.  Seen by Dr Carlyle Dolly from cardiology on 04/22/2019 as a reference for kidney transplant workup - planned for lexiscan - these results not readily visible on records.  *  Last echo at [facility] on June 2020, EF 55%, impressions as below.   INTERPRETATION ---------------------------------------------------------------  NORMAL LEFT VENTRICULAR SYSTOLIC FUNCTION WITH MODERATE LVH  NORMAL LA PRESSURES WITH NORMAL DIASTOLIC FUNCTION  NORMAL RIGHT VENTRICULAR SYSTOLICFUNCTION  VALVULAR REGURGITATION: TRIVIAL MR, TRIVIAL TR  NO VALVULAR STENOSIS  NO PRIOR STUDY FOR COMPARISON   Also had nondiagnostic stress test at [facility] in June 2020.  HPI     Past Medical History:  Diagnosis Date  . Anemia    Attributed to chronic disease/chronic kidney disease  . Chronic diastolic heart failure ([place]) 2011   EF 35% by echo in 2011; echo in 08/2011-normal EF, moderate to severe LVH; BNP level of [phone] in 2011-12  . Chronic obstructive pulmonary disease ([place])    with asthmatic component  . ESRD (end stage renal disease) on dialysis [facility])    secondary hyperparathyroidism  . Hypertension    h/o hypertensive crisis with encephalopathy  . Obesity   . Tobacco abuse    Approximate consumption  of 25 pack years; continuing at 0.5 pack per day    Patient Active Problem List   Diagnosis Date Noted  . Chronic low back pain 08/14/2019  . Heme positive stool 01/14/2014  . Chronic shoulder pain 06/15/2012  . OA (osteoarthritis) of knee 06/15/2012  . Hypertension   . Chronic obstructive pulmonary disease ([place])   . Chronic diastolic heart failure ([place])   . Tobacco abuse   . ED (erectile dysfunction) 10/11/2011  . Insomnia 08/11/2011  . ESRD (end stage renal disease) on dialysis ([place]) 06/28/2011  . Anemia of renal disease 06/28/2011  . Obesity 06/28/2011  . Congestive heart failure (CHF) ([place]) 02/11/2010    Past Surgical History:  Procedure Laterality Date  . ARTERIOVENOUS GRAFT PLACEMENT  2011  . COLONOSCOPY N/A 02/04/2014   Procedure: COLONOSCOPY;  Surgeon: [name], MD;  Location: [facility] ENDO SUITE;  Service: Endoscopy;  Laterality: N/A;  12:00  . COLONOSCOPY N/A 07/03/2019   Procedure: COLONOSCOPY;  Surgeon: [name], MD;  Location: [facility] ENDO SUITE;  Service: Endoscopy;  Laterality: N/A;  12:00-office rescheduled 2/9 @ 11:00am  . POLYPECTOMY  07/03/2019   Procedure: POLYPECTOMY;  Surgeon: [name], MD;  Location: [facility] ENDO SUITE;  Service: Endoscopy;;       Family History  Problem Relation Age of Onset  . Heart disease Father   . Diabetes Sister   . Hypertension Sister   . Diabetes Sister   . Hypertension Sister   . Kidney disease Mother   .  Hypertension Mother   . Hypertension Sister   . Hypertension Brother   . Hypertension Brother   . Hypertension Brother   . Colon cancer Neg Hx     Social History   Tobacco Use  . Smoking status: Current Every Day Smoker    Packs/day: 0.50    Years: 25.00    Pack years: 12.50    Types: Cigarettes  . Smokeless tobacco: Never Used  . Tobacco comment: 8-10 ciggs per day   Substance Use Topics  . Alcohol use: No    Comment: quit etoh about 2011. used to drink about 8-10 beers a week  . Drug use: No    Home  Medications Prior to Admission medications   Medication Sig Start Date End Date Taking? Authorizing Provider  albuterol (PROAIR HFA) 108 (90 BASE) MCG/ACT inhaler Inhale 2 puffs into the lungs every 4 (four) hours as needed for wheezing. 02/11/14  Yes [place], Modena Nunnery, MD  aspirin 325 MG tablet Take 81 mg by mouth daily.   Yes [provider]  atorvastatin (LIPITOR) 10 MG tablet Take 10 mg by mouth daily.   Yes [provider]  AURYXIA 1 GM 210 MG(Fe) tablet Take 420 mg by mouth 3 (three) times daily with meals.  11/02/16  Yes [provider]  b complex-vitamin c-folic acid (NEPHRO-VITE) 0.8 MG TABS Take 0.8 mg by mouth daily.    Yes [provider]  diphenhydramine-acetaminophen (TYLENOL PM) 25-500 MG TABS tablet Take 1 tablet by mouth at bedtime as needed (sleep).   Yes [provider]  DULoxetine (CYMBALTA) 30 MG capsule Take 30 mg by mouth daily. 11/08/19  Yes [provider]  gabapentin (NEURONTIN) 300 MG capsule Take 300 mg by mouth daily. 10/09/19  Yes [provider]  Omega-3 1000 MG CAPS Take 2,000 mg by mouth daily.   Yes [provider]  oxyCODONE-acetaminophen (PERCOCET) 7.5-325 MG tablet Take 1 tablet by mouth every 6 (six) hours as needed for moderate pain. 10/09/19  Yes [provider]  SENSIPAR 30 MG tablet Take 30 mg by mouth daily. 01/04/14  Yes [provider]  sevelamer carbonate (RENVELA) 800 MG tablet Take 1,600-3,200 mg by mouth See admin instructions. Take 3200 mg with meals and 1600 with snack   Yes [provider]  budesonide-formoterol (SYMBICORT) 80-4.5 MCG/ACT inhaler Inhale 2 puffs into the lungs 2 (two) times daily. Patient not taking: Reported on 07/02/2020 06/05/13   [name], MD    Allergies    Patient has no known allergies.  Review of Systems   Review of Systems  Constitutional: Negative for chills and fever.  Eyes: Negative for pain and visual disturbance.   Respiratory: Positive for shortness of breath. Negative for cough.   Cardiovascular: Positive for palpitations. Negative for chest pain.  Gastrointestinal: Negative for abdominal pain and vomiting.  Musculoskeletal: Negative for arthralgias and back pain.  Skin: Negative for color change and rash.  Neurological: Negative for syncope, light-headedness and headaches.  All other systems reviewed and are negative.   Physical Exam Updated Vital Signs BP 115/90   Pulse 91   Temp 98.3 F (36.8 C) (Oral)   Resp 18   Ht 6' (1.829 m)   Wt 115.7 kg   SpO2 98%   BMI 34.58 kg/m   Physical Exam Constitutional:      General: He is not in acute distress. HENT:     Head: Normocephalic and atraumatic.  Eyes:     Conjunctiva/sclera:  Conjunctivae normal.     Pupils: Pupils are equal, round, and reactive to light.  Cardiovascular:     Rate and Rhythm: Regular rhythm. Tachycardia present.     Comments: HR 190 Pulmonary:     Effort: Pulmonary effort is normal. No respiratory distress.  Abdominal:     General: There is no distension.     Tenderness: There is no abdominal tenderness.  Musculoskeletal:     Comments: Fistula with palpable thrill  Skin:    General: Skin is warm and dry.  Neurological:     General: No focal deficit present.     Mental Status: He is alert. Mental status is at baseline.  Psychiatric:        Mood and Affect: Mood normal.        Behavior: Behavior normal.     ED Results / Procedures / Treatments   Labs (all labs ordered are listed, but only abnormal results are displayed) Labs Reviewed  BASIC METABOLIC PANEL - Abnormal; Notable for the following components:      Result Value   Chloride 97 (*)    Glucose, Bld 111 (*)    BUN 38 (*)    Creatinine, Ser 10.61 (*)    GFR, Estimated 5 (*)    All other components within normal limits  CBC WITH DIFFERENTIAL/PLATELET - Abnormal; Notable for the following components:   RBC 5.82 (*)    Hemoglobin 17.6 (*)     HCT 58.1 (*)    RDW 15.8 (*)    All other components within normal limits  MAGNESIUM - Abnormal; Notable for the following components:   Magnesium 2.9 (*)    All other components within normal limits    EKG EKG Interpretation  Date/Time:  Wednesday July 02 2020 09:12:10 EST Ventricular Rate:  116 PR Interval:    QRS Duration: 78 QT Interval:  305 QTC Calculation: 424 R Axis:   27 Text Interpretation: Sinus tachycardia RAE, consider biatrial enlargement Low voltage, precordial leads Nonspecific T abnormalities, lateral leads Confirmed by Octaviano Glow [phone]) on 07/02/2020 9:15:44 AM   Radiology DG Chest Portable 1 View  Result Date: 07/02/2020 CLINICAL DATA:  Arrhythmia EXAM: PORTABLE CHEST 1 VIEW COMPARISON:  07/25/2010 FINDINGS: Cardiac shadow is mildly prominent but stable. Aortic calcifications are again seen. Lungs are well aerated bilaterally. No focal infiltrate or effusion is seen. No bony abnormality is noted. IMPRESSION: No acute abnormality noted. Electronically Signed   By: Inez Catalina M.D.   On: 07/02/2020 09:49    Procedures .Cardioversion  Date/Time: 07/02/2020 10:54 AM Performed by: Wyvonnia Dusky, MD Authorized by: Wyvonnia Dusky, MD   Consent:    Consent obtained:  Written   Consent given by:  Parent   Alternatives discussed:  No treatment, rate-control medication, delayed treatment and alternative treatment Pre-procedure details:    Cardioversion basis:  Emergent   Rhythm:  Supraventricular tachycardia   Electrode placement:  Anterior-posterior Patient sedated: No Comments:     Chemical cardioversion with adenosine, 2 doses, 6 mg followed by 12 mg, with successful cardioversion to sinus rhythm.  No electric cardioversion delivered.       Medications Ordered in ED Medications  adenosine (ADENOCARD) 6 MG/2ML injection 6 mg (6 mg Intravenous Given 07/02/20 0914)  adenosine (ADENOCARD) 6 MG/2ML injection 12 mg (12 mg Intravenous Given 07/02/20  0916)    ED Course  I have reviewed the triage vital signs and the nursing notes.  Pertinent labs & imaging results that were  available during my care of the patient were reviewed by me and considered in my medical decision making (see chart for details).  This patient presents with SVT, cardioversed chemically with IV adenosine here.  Does not appear to be A Fib He is asymptomatic following cardioversion  No evidence of volume overload, hypoxia, pulm edema.  No indication for emergent dialysis at this time.  I ordered, reviewed, and interpreted labs as noted below I ordered imaging studies which included dg chest I independently visualized and interpreted imaging which showed no acute intrathoracic process and the monitor tracing which showed sinus tachycardia then sinus rhythm Previous records obtained and reviewed showing cardiac evaluation  Clinical Course as of 07/02/20 1753  Wed Jul 02, 2020  0914 Successful adenosine cardioversion 12 mg into sinus rhythm with HR 105 bpm currently [MT]  0939 Hgb 16.7.  mg mildly eleavted 2.9.  K 4.8 [MT]  1117 Still sinus HR now < 100 bpm, patient asymptomatic.  Okay to d/c.  Advised f/u with Dr Harl Bowie from cardiology. [MT]    Clinical Course User Index [MT] Naomii Kreger, Carola Rhine, MD    Final Clinical Impression(s) / ED Diagnoses Final diagnoses:  SVT (supraventricular tachycardia) [facility])    Rx / DC Orders ED Discharge Orders    None       Shakai Dolley, Carola Rhine, MD 07/02/20 1753

## 2020-07-02 NOTE — ED Triage Notes (Signed)
Pt getting dialysis and HR went to 190. Got about 1/3 of dialysis treatment before EMS picked him up. No hx of a-fib.

## 2020-07-04 DIAGNOSIS — N186 End stage renal disease: Secondary | ICD-10-CM | POA: Diagnosis not present

## 2020-07-04 DIAGNOSIS — Z992 Dependence on renal dialysis: Secondary | ICD-10-CM | POA: Diagnosis not present

## 2020-07-07 DIAGNOSIS — Z992 Dependence on renal dialysis: Secondary | ICD-10-CM | POA: Diagnosis not present

## 2020-07-07 DIAGNOSIS — N2581 Secondary hyperparathyroidism of renal origin: Secondary | ICD-10-CM | POA: Diagnosis not present

## 2020-07-07 DIAGNOSIS — D509 Iron deficiency anemia, unspecified: Secondary | ICD-10-CM | POA: Diagnosis not present

## 2020-07-07 DIAGNOSIS — N186 End stage renal disease: Secondary | ICD-10-CM | POA: Diagnosis not present

## 2020-07-09 DIAGNOSIS — Z992 Dependence on renal dialysis: Secondary | ICD-10-CM | POA: Diagnosis not present

## 2020-07-09 DIAGNOSIS — N186 End stage renal disease: Secondary | ICD-10-CM | POA: Diagnosis not present

## 2020-07-09 NOTE — Progress Notes (Signed)
Cardiology Office Note  Date: 07/10/2020   ID: Brian Barrera, DOB [birth year], MRN [id number]  PCP:  Curlene Labrum, MD  Cardiologist:  Carlyle Dolly, MD Electrophysiologist:  None   Chief Complaint: Hospital follow up SVT  History of Present Illness: Brian Barrera is a 50 y.o. male with a history of Chronic diastolic HF, SVT, anemia, essential hypertension, COPD, ESRD, tobacco abuse, obesity.  Last encounter with Dr. Harl Bowie 04/12/2019.  Patient was seen for evaluation for renal transplant.  No chest pain, shortness of breath, DOE.  As part of work-up at [facility] patient had an echo with normal EF, no significant pathology.  Stress echo was attempted but unable to reach target heart rate.  Nuclear imaging stress test was ordered.  Patient presented to [facility], ED 07/02/2020 with arrhythmia.  Had completed one half of dialysis when heart rate was noted to be in the 180s.  He had palpitations in his chest.  He denied any history of SVT or atrial fibrillation.  Reported mild chest discomfort.  He was cardioverted chemically with IV adenosine.  He was asymptomatic following cardioversion with heart rate less than 100.  He was advised to follow-up with cardiology  He is here for follow-up status post episode of SVT with presentation to ED resolved with 12 mg dose of adenosine.  He states he is never had any episodes of arrhythmias in the past.  States he has had no recurrent episodes since the visit to the emergency room.  He denies any other symptoms such as anginal, dyspnea or shortness of breath, orthostatic symptoms, CVA or TIA-like symptoms, PND, orthopnea.  No bleeding, claudication-like symptoms, DVT or PE-like symptoms, or lower extremity edema.  Past Medical History:  Diagnosis Date  . Anemia    Attributed to chronic disease/chronic kidney disease  . Chronic diastolic heart failure ([place]) 2011   EF 35% by echo in 2011; echo in 08/2011-normal EF, moderate to severe LVH; BNP  level of [phone] in 2011-12  . Chronic obstructive pulmonary disease ([place])    with asthmatic component  . ESRD (end stage renal disease) on dialysis [facility])    secondary hyperparathyroidism  . Hypertension    h/o hypertensive crisis with encephalopathy  . Obesity   . Tobacco abuse    Approximate consumption of 25 pack years; continuing at 0.5 pack per day    Past Surgical History:  Procedure Laterality Date  . ARTERIOVENOUS GRAFT PLACEMENT  2011  . COLONOSCOPY N/A 02/04/2014   Procedure: COLONOSCOPY;  Surgeon: [name], MD;  Location: [facility] ENDO SUITE;  Service: Endoscopy;  Laterality: N/A;  12:00  . COLONOSCOPY N/A 07/03/2019   Procedure: COLONOSCOPY;  Surgeon: [name], MD;  Location: [facility] ENDO SUITE;  Service: Endoscopy;  Laterality: N/A;  12:00-office rescheduled 2/9 @ 11:00am  . POLYPECTOMY  07/03/2019   Procedure: POLYPECTOMY;  Surgeon: [name], MD;  Location: [facility] ENDO SUITE;  Service: Endoscopy;;    Current Outpatient Medications  Medication Sig Dispense Refill  . albuterol (ACCUNEB) 1.25 MG/3ML nebulizer solution as needed.    [name] albuterol (PROAIR HFA) 108 (90 BASE) MCG/ACT inhaler Inhale 2 puffs into the lungs every 4 (four) hours as needed for wheezing. 3 Inhaler 0  . aspirin EC 81 MG tablet Take 81 mg by mouth daily.    [name] atorvastatin (LIPITOR) 10 MG tablet Take 10 mg by mouth daily.    Brian Barrera 1 GM 210 MG(Fe) tablet Take 420 mg by mouth 3 (  three) times daily with meals.     [name] b complex-vitamin c-folic acid (NEPHRO-VITE) 0.8 MG TABS Take 0.8 mg by mouth daily.     . budesonide-formoterol (SYMBICORT) 80-4.5 MCG/ACT inhaler Inhale 2 puffs into the lungs 2 (two) times daily. 1 Inhaler 12  . cinacalcet (SENSIPAR) 60 MG tablet Take 60 mg by mouth daily.    . diphenhydramine-acetaminophen (TYLENOL PM) 25-500 MG TABS tablet Take 1 tablet by mouth at bedtime as needed (sleep).    . DULoxetine (CYMBALTA) 30 MG capsule Take 30 mg by mouth daily.    [name] gabapentin  (NEURONTIN) 300 MG capsule Take 300 mg by mouth daily.    . Omega-3 1000 MG CAPS Take 2,000 mg by mouth daily.    [name] oxyCODONE-acetaminophen (PERCOCET) 7.5-325 MG tablet Take 1 tablet by mouth every 6 (six) hours as needed for moderate pain.    . sevelamer carbonate (RENVELA) 800 MG tablet Take 1,600-3,200 mg by mouth See admin instructions. Take 3200 mg with meals and 1600 with snack     No current facility-administered medications for this visit.   Allergies:  Patient has no known allergies.   Social History: The patient  reports that he has been smoking cigarettes. He has a 12.50 pack-year smoking history. He has never used smokeless tobacco. He reports that he does not drink alcohol and does not use drugs.   Family History: The patient's family history includes Diabetes in his sister and sister; Heart disease in his father; Hypertension in his brother, brother, brother, mother, sister, sister, and sister; Kidney disease in his mother.   ROS:  Please see the history of present illness. Otherwise, complete review of systems is positive for none.  All other systems are reviewed and negative.   Physical Exam: VS:  BP 118/84   Pulse 94   Ht 6' (1.829 m)   Wt 252 lb (114.3 kg)   SpO2 96%   BMI 34.18 kg/m , BMI Body mass index is 34.18 kg/m.  Wt Readings from Last 3 Encounters:  07/10/20 252 lb (114.3 kg)  07/02/20 255 lb (115.7 kg)  04/03/20 250 lb (113.4 kg)    General: Patient appears comfortable at rest. Neck: Supple, no elevated JVP or carotid bruits, no thyromegaly. Lungs: Clear to auscultation, nonlabored breathing at rest. Cardiac: Regular rate and rhythm, no S3 or significant systolic murmur, no pericardial rub. Extremities: No pitting edema, distal pulses 2+. Skin: Warm and dry. Musculoskeletal: No kyphosis. Neuropsychiatric: Alert and oriented x3, affect grossly appropriate.  ECG:  EKG at [facility], ED 07/02/2020 showed sinus tachycardia rate of 116, right atrial  enlargement, consider biatrial enlargement, low voltage, precordial leads.  Recent Labwork: 07/02/2020: BUN 38; Creatinine, Ser 10.61; Hemoglobin 17.6; Magnesium 2.9; Platelets 231; Potassium 4.8; Sodium 137     Component Value Date/Time   CHOL 175 01/04/2013 1140   TRIG 172 (H) 01/04/2013 1140   HDL 45 01/04/2013 1140   CHOLHDL 3.9 01/04/2013 1140   VLDL 34 01/04/2013 1140   LDLCALC 96 01/04/2013 1140    Other Studies Reviewed Today: Diagnostic Studies  10/2018 [facility] stress echo Protocol: Treadmill (Modified [place])       Drugs: Beta Blockers  Target Heart Rate: 145 Maximum Predicted Heart Rate: 171    Resting ECG: Sinus rhythm   Protocol Note: LABETOLOL 200mg  BID, LAST DOSE 11/17/2018 AM    TYPE   STAGE  TIME  HR  BP  RPE SPO2 COMMENTS  -------- ----- --------- --- ------- ----- ---- ------------------------------  Baseline         90 105/ 63  [place]  1   6    90 105/ 63  [place]  2   1    88 97/ 67      STANDING BP  Stress   1  180 sec. 105 130/ 64 7/20  Stress   2  180 sec. 113 130/ 74 11/20  Stress   3  121 sec. 118 140/ 72 15/20   LEG DISCOMFORT 7/10  Recovery  1   1 min. 110 112/ 65  Recovery  2   2 min. 103 115/ 55  Recovery  3   4 min. 88 131/ 61  Recovery  4   6 min. 90 122/ 65  Recovery  5   8 min. 87 120/ 66      LEG DISCOMFORT RESOLVED    Stress Duration: 8.02 minutes. Max Stress H.R: 118  Target Heart Rate (145) Achieved: No  Max. workload of 4.60 METs was achieved during exercise.    HR Response: Attenuated secondary to medication    BP Response: Normal resting BP - blunted response    WALL SEGMENT FINDINGS --------------------------------------------------------               Rest      Stress             --------------- ---------------  Anterior Septum Basal: Normal     Hyperkinetic           Mid: Normal     Hyperkinetic     Apical Septum: Normal     Hyperkinetic    Anterior Wall Basal: Normal     Hyperkinetic          Mid: Normal     Hyperkinetic         Apical: Normal     Hyperkinetic     Lateral Wall Basal: Normal     Hyperkinetic          Mid: Normal     Hyperkinetic         Apical: Normal     Hyperkinetic    Posterior Wall Basal: Normal     Hyperkinetic          Mid: Normal     Hyperkinetic    Inferior Wall Basal: Normal     Hyperkinetic          Mid: Normal     Hyperkinetic         Apical: Normal     Hyperkinetic    Inferior Septum Basal: Normal     Hyperkinetic          Mid: Normal     Hyperkinetic             EF: >55%      >55%      ADDITIONAL FINDINGS ----------------------------------------------------------    Chest Discomfort: None     Arrhythmia: None  Termination Reason: Leg fatigue    Complications: None    STRESS ECG RESULTS -----------------------------------------------------------    ECG Results: Nondiagnostic due to inadequate heart rate. .    INTERPRETATION ---------------------------------------------------------------    INDETERMINATE.  NO DOPPLER PERFORMED FOR VALVULAR REGURGITATION  NO DOPPLER PERFORMED FOR VALVULAR STENOSIS  Note: NO WALL MOTION ABNORMALITIES AT REST OR SUBMAXIMAL STRESS (PEAK  STRESS <118 bpm, TARGET = 145 bpm)  Maximum workload of 4.60 METs was achieved during exercise.  NORMAL RESTING BP - BLUNTED RESPONSE   10/2018 echo INTERPRETATION ---------------------------------------------------------------  NORMAL LEFT VENTRICULAR SYSTOLIC FUNCTION WITH MODERATE LVH  NORMAL LA PRESSURES WITH NORMAL DIASTOLIC FUNCTION  NORMAL RIGHT VENTRICULAR SYSTOLIC FUNCTION  VALVULAR  REGURGITATION: TRIVIAL MR, TRIVIAL TR  NO VALVULAR STENOSIS  NO PRIOR STUDY FOR COMPARISON   NST 04/23/2019 Study Result  Narrative & Impression   No diagnostic ST segment changes to indicate ischemia.  Small, mild intensity, inferior defect that is partially reversible at the apex in the setting of increased radiotracer uptake on rest imaging near the inferior wall. Otherwise defect is fixed and suggestive of soft tissue attenuation. Cannot exclude a mild inferior apical ischemic territory, although with reduced specificity.  This is a low risk study.  Nuclear stress EF: 53%.        Assessment and Plan:  1. SVT (supraventricular tachycardia) ([place])   2. Essential hypertension   3. Chronic diastolic heart failure ([place])   4. ESRD (end stage renal disease) on dialysis ([place])    1. SVT (supraventricular tachycardia) ([facility]) Recent episode of SVT during dialysis.  He presented to [facility], ED where he was chemically cardioverted with 12 mg of adenosine IV.  Heart rate returned to less than 100.  He states he has never had any arrhythmias before this occurrence.  States he has had no recurrences since that time.  Please get a 14-day ZIO monitor to assess for arrhythmias.  2. Essential hypertension Blood pressure is well controlled today at 118/84.  Currently not on any antihypertensive medications.  3. Chronic diastolic heart failure [facility]) Last echocardiogram June 2020 showed normal LV function with moderate LVH.  Normal diastolic function.  Trivial MR, trivial TR  4. ESRD (end stage renal disease) on dialysis [facility]) Receives dialysis on Mondays Wednesdays and Fridays.  5.  Hyperlipidemia He states his primary care provider recently started him on Lipitor.  Continue atorvastatin 10 mg daily.  Continue aspirin 81 mg daily.  Medication Adjustments/Labs and Tests Ordered: Current medicines are reviewed at length with the patient today.  Concerns regarding medicines are  outlined above.   Disposition: Follow-up with Dr. Harl Bowie or APP 6 to 8 weeks Signed, Levell July, NP 07/10/2020 9:25 AM    [facility], [place], [place] [address] Phone: [phone]; Fax: [phone]

## 2020-07-10 ENCOUNTER — Other Ambulatory Visit: Payer: Self-pay | Admitting: Cardiology

## 2020-07-10 ENCOUNTER — Telehealth: Payer: Self-pay | Admitting: Cardiology

## 2020-07-10 ENCOUNTER — Ambulatory Visit (INDEPENDENT_AMBULATORY_CARE_PROVIDER_SITE_OTHER): Payer: Medicare Other | Admitting: Family Medicine

## 2020-07-10 ENCOUNTER — Ambulatory Visit (INDEPENDENT_AMBULATORY_CARE_PROVIDER_SITE_OTHER): Payer: Medicare Other

## 2020-07-10 ENCOUNTER — Encounter: Payer: Self-pay | Admitting: Family Medicine

## 2020-07-10 VITALS — BP 118/84 | HR 94 | Ht 72.0 in | Wt 252.0 lb

## 2020-07-10 DIAGNOSIS — I5032 Chronic diastolic (congestive) heart failure: Secondary | ICD-10-CM

## 2020-07-10 DIAGNOSIS — Z992 Dependence on renal dialysis: Secondary | ICD-10-CM

## 2020-07-10 DIAGNOSIS — N186 End stage renal disease: Secondary | ICD-10-CM

## 2020-07-10 DIAGNOSIS — I471 Supraventricular tachycardia: Secondary | ICD-10-CM

## 2020-07-10 DIAGNOSIS — I10 Essential (primary) hypertension: Secondary | ICD-10-CM

## 2020-07-10 DIAGNOSIS — J449 Chronic obstructive pulmonary disease, unspecified: Secondary | ICD-10-CM | POA: Diagnosis not present

## 2020-07-10 NOTE — Telephone Encounter (Signed)
Pre-cert Verification for the following procedure    14 day zio - svt

## 2020-07-10 NOTE — Patient Instructions (Signed)
Medication Instructions:  Continue all current medications.  Labwork: none  Testing/Procedures:  Your physician has recommended that you wear a 14 day event monitor. Event monitors are medical devices that record the heart's electrical activity. Doctors most often us these monitors to diagnose arrhythmias. Arrhythmias are problems with the speed or rhythm of the heartbeat. The monitor is a small, portable device. You can wear one while you do your normal daily activities. This is usually used to diagnose what is causing palpitations/syncope (passing out).  Office will contact with results via phone or letter.    Follow-Up: 6-8 weeks   Any Other Special Instructions Will Be Listed Below (If Applicable).  If you need a refill on your cardiac medications before your next appointment, please call your pharmacy.  

## 2020-07-11 DIAGNOSIS — N186 End stage renal disease: Secondary | ICD-10-CM | POA: Diagnosis not present

## 2020-07-11 DIAGNOSIS — Z992 Dependence on renal dialysis: Secondary | ICD-10-CM | POA: Diagnosis not present

## 2020-07-14 DIAGNOSIS — Z992 Dependence on renal dialysis: Secondary | ICD-10-CM | POA: Diagnosis not present

## 2020-07-14 DIAGNOSIS — N186 End stage renal disease: Secondary | ICD-10-CM | POA: Diagnosis not present

## 2020-07-14 DIAGNOSIS — N2581 Secondary hyperparathyroidism of renal origin: Secondary | ICD-10-CM | POA: Diagnosis not present

## 2020-07-15 DIAGNOSIS — I471 Supraventricular tachycardia: Secondary | ICD-10-CM

## 2020-07-16 DIAGNOSIS — N186 End stage renal disease: Secondary | ICD-10-CM | POA: Diagnosis not present

## 2020-07-16 DIAGNOSIS — Z992 Dependence on renal dialysis: Secondary | ICD-10-CM | POA: Diagnosis not present

## 2020-07-18 DIAGNOSIS — Z992 Dependence on renal dialysis: Secondary | ICD-10-CM | POA: Diagnosis not present

## 2020-07-18 DIAGNOSIS — N186 End stage renal disease: Secondary | ICD-10-CM | POA: Diagnosis not present

## 2020-07-21 DIAGNOSIS — Z992 Dependence on renal dialysis: Secondary | ICD-10-CM | POA: Diagnosis not present

## 2020-07-21 DIAGNOSIS — N186 End stage renal disease: Secondary | ICD-10-CM | POA: Diagnosis not present

## 2020-07-21 DIAGNOSIS — N2581 Secondary hyperparathyroidism of renal origin: Secondary | ICD-10-CM | POA: Diagnosis not present

## 2020-07-22 DIAGNOSIS — N186 End stage renal disease: Secondary | ICD-10-CM | POA: Diagnosis not present

## 2020-07-22 DIAGNOSIS — Z992 Dependence on renal dialysis: Secondary | ICD-10-CM | POA: Diagnosis not present

## 2020-07-23 DIAGNOSIS — Z992 Dependence on renal dialysis: Secondary | ICD-10-CM | POA: Diagnosis not present

## 2020-07-23 DIAGNOSIS — N186 End stage renal disease: Secondary | ICD-10-CM | POA: Diagnosis not present

## 2020-07-25 DIAGNOSIS — N186 End stage renal disease: Secondary | ICD-10-CM | POA: Diagnosis not present

## 2020-07-25 DIAGNOSIS — Z992 Dependence on renal dialysis: Secondary | ICD-10-CM | POA: Diagnosis not present

## 2020-07-28 DIAGNOSIS — N186 End stage renal disease: Secondary | ICD-10-CM | POA: Diagnosis not present

## 2020-07-28 DIAGNOSIS — N2581 Secondary hyperparathyroidism of renal origin: Secondary | ICD-10-CM | POA: Diagnosis not present

## 2020-07-28 DIAGNOSIS — Z992 Dependence on renal dialysis: Secondary | ICD-10-CM | POA: Diagnosis not present

## 2020-07-30 DIAGNOSIS — N186 End stage renal disease: Secondary | ICD-10-CM | POA: Diagnosis not present

## 2020-07-30 DIAGNOSIS — Z992 Dependence on renal dialysis: Secondary | ICD-10-CM | POA: Diagnosis not present

## 2020-08-01 DIAGNOSIS — Z992 Dependence on renal dialysis: Secondary | ICD-10-CM | POA: Diagnosis not present

## 2020-08-01 DIAGNOSIS — N186 End stage renal disease: Secondary | ICD-10-CM | POA: Diagnosis not present

## 2020-08-04 DIAGNOSIS — D509 Iron deficiency anemia, unspecified: Secondary | ICD-10-CM | POA: Diagnosis not present

## 2020-08-04 DIAGNOSIS — N2581 Secondary hyperparathyroidism of renal origin: Secondary | ICD-10-CM | POA: Diagnosis not present

## 2020-08-04 DIAGNOSIS — Z992 Dependence on renal dialysis: Secondary | ICD-10-CM | POA: Diagnosis not present

## 2020-08-04 DIAGNOSIS — I471 Supraventricular tachycardia: Secondary | ICD-10-CM | POA: Diagnosis not present

## 2020-08-04 DIAGNOSIS — N186 End stage renal disease: Secondary | ICD-10-CM | POA: Diagnosis not present

## 2020-08-06 ENCOUNTER — Emergency Department (HOSPITAL_COMMUNITY)
Admission: EM | Admit: 2020-08-06 | Discharge: 2020-08-06 | Disposition: A | Discharge status: Home / Self Care | Payer: Medicare Other | Attending: Emergency Medicine | Admitting: Emergency Medicine

## 2020-08-06 ENCOUNTER — Other Ambulatory Visit: Payer: Self-pay

## 2020-08-06 ENCOUNTER — Encounter (HOSPITAL_COMMUNITY): Payer: Self-pay | Admitting: Emergency Medicine

## 2020-08-06 ENCOUNTER — Telehealth: Payer: Self-pay | Admitting: Family Medicine

## 2020-08-06 DIAGNOSIS — F17210 Nicotine dependence, cigarettes, uncomplicated: Secondary | ICD-10-CM | POA: Insufficient documentation

## 2020-08-06 DIAGNOSIS — N186 End stage renal disease: Secondary | ICD-10-CM | POA: Diagnosis not present

## 2020-08-06 DIAGNOSIS — I132 Hypertensive heart and chronic kidney disease with heart failure and with stage 5 chronic kidney disease, or end stage renal disease: Secondary | ICD-10-CM | POA: Insufficient documentation

## 2020-08-06 DIAGNOSIS — Z992 Dependence on renal dialysis: Secondary | ICD-10-CM | POA: Insufficient documentation

## 2020-08-06 DIAGNOSIS — I499 Cardiac arrhythmia, unspecified: Secondary | ICD-10-CM | POA: Diagnosis not present

## 2020-08-06 DIAGNOSIS — Z7982 Long term (current) use of aspirin: Secondary | ICD-10-CM | POA: Insufficient documentation

## 2020-08-06 DIAGNOSIS — R0689 Other abnormalities of breathing: Secondary | ICD-10-CM | POA: Diagnosis not present

## 2020-08-06 DIAGNOSIS — I5032 Chronic diastolic (congestive) heart failure: Secondary | ICD-10-CM | POA: Insufficient documentation

## 2020-08-06 DIAGNOSIS — I471 Supraventricular tachycardia: Secondary | ICD-10-CM | POA: Insufficient documentation

## 2020-08-06 DIAGNOSIS — Z743 Need for continuous supervision: Secondary | ICD-10-CM | POA: Diagnosis not present

## 2020-08-06 DIAGNOSIS — R000 Tachycardia, unspecified: Secondary | ICD-10-CM | POA: Diagnosis not present

## 2020-08-06 DIAGNOSIS — J449 Chronic obstructive pulmonary disease, unspecified: Secondary | ICD-10-CM | POA: Diagnosis not present

## 2020-08-06 LAB — BASIC METABOLIC PANEL
Anion gap: 14 (ref 5–15)
BUN: 51 mg/dL — ABNORMAL HIGH (ref 6–20)
CO2: 23 mmol/L (ref 22–32)
Calcium: 8.6 mg/dL — ABNORMAL LOW (ref 8.9–10.3)
Chloride: 101 mmol/L (ref 98–111)
Creatinine, Ser: 10.55 mg/dL — ABNORMAL HIGH (ref 0.61–1.24)
GFR, Estimated: 5 mL/min — ABNORMAL LOW (ref >60)
Glucose, Bld: 106 mg/dL — ABNORMAL HIGH (ref 70–99)
Potassium: 4 mmol/L (ref 3.5–5.1)
Sodium: 138 mmol/L (ref 135–145)

## 2020-08-06 LAB — CBC WITH DIFFERENTIAL/PLATELET
Abs Immature Granulocytes: 0.02 10*3/uL (ref 0.00–0.07)
Basophils Absolute: 0.1 10*3/uL (ref 0.0–0.1)
Basophils Relative: 1 %
Eosinophils Absolute: 0.2 10*3/uL (ref 0.0–0.5)
Eosinophils Relative: 2 %
HCT: 48.2 % (ref 39.0–52.0)
Hemoglobin: 14.9 g/dL (ref 13.0–17.0)
Immature Granulocytes: 0 %
Lymphocytes Relative: 19 %
Lymphs Abs: 1.7 10*3/uL (ref 0.7–4.0)
MCH: 30.5 pg (ref 26.0–34.0)
MCHC: 30.9 g/dL (ref 30.0–36.0)
MCV: 98.8 fL (ref 80.0–100.0)
Monocytes Absolute: 0.9 10*3/uL (ref 0.1–1.0)
Monocytes Relative: 10 %
Neutro Abs: 6.2 10*3/uL (ref 1.7–7.7)
Neutrophils Relative %: 68 %
Platelets: 193 10*3/uL (ref 150–400)
RBC: 4.88 MIL/uL (ref 4.22–5.81)
RDW: 14.7 % (ref 11.5–15.5)
WBC: 9.1 10*3/uL (ref 4.0–10.5)
nRBC: 0 % (ref 0.0–0.2)

## 2020-08-06 MED ORDER — ADENOSINE 6 MG/2ML IV SOLN
12.0000 mg | Freq: Once | INTRAVENOUS | Status: AC
  Administered 2020-08-06: 12 mg via INTRAVENOUS

## 2020-08-06 MED ORDER — ADENOSINE 6 MG/2ML IV SOLN
6.0000 mg | Freq: Once | INTRAVENOUS | Status: AC
  Administered 2020-08-06: 6 mg via INTRAVENOUS

## 2020-08-06 MED ORDER — ADENOSINE 6 MG/2ML IV SOLN
INTRAVENOUS | Status: AC
  Filled 2020-08-06: qty 4

## 2020-08-06 NOTE — Telephone Encounter (Signed)
Advised that zio monitor has not been reviewed by his provider yet and once its reviewed, he will be contacted with the final result. Verbalized understanding.

## 2020-08-06 NOTE — Discharge Instructions (Addendum)
Call your primary care doctor or specialist as discussed in the next 2-3 days.   Return immediately back to the ER if:  Your symptoms worsen within the next 12-24 hours. You develop new symptoms such as new fevers, persistent vomiting, new pain, shortness of breath, or new weakness or numbness, or if you have any other concerns.  

## 2020-08-06 NOTE — ED Triage Notes (Signed)
Pt was at dialysis for an hour and half. Started having central cp, EMS called. Pt in SVT with HR 180. 6mg  of adenosine given with no change.    Pt doesn't c/o of any cp of sob at this time

## 2020-08-06 NOTE — Telephone Encounter (Signed)
Patient called requesting results of his recent monitor.

## 2020-08-06 NOTE — ED Notes (Signed)
Pt back in NSR. EKG obtained and given to EDP. Pt c/o of no cp or sob

## 2020-08-06 NOTE — ED Provider Notes (Signed)
[facility] EMERGENCY DEPARTMENT Provider Note   CSN: [id number] Arrival date & time: 08/06/20  0757     History Chief Complaint  Patient presents with  . Tachycardia    Brian Barrera is a 50 y.o. male.  Patient presents chief complaint of palpitations and chest discomfort.  Symptoms started during dialysis.  He was halfway through dialysis before he was noted to be severely tachycardic at 160- 170 bpm.  Patient otherwise denies any fevers or cough or vomiting or diarrhea.  Currently states he has no chest pain or chest discomfort.  He has had similar episodes of SVT in the past that required adenosine.  He is scheduled to see his cardiologist in 2 weeks.        Past Medical History:  Diagnosis Date  . Anemia    Attributed to chronic disease/chronic kidney disease  . Chronic diastolic heart failure ([place]) 2011   EF 35% by echo in 2011; echo in 08/2011-normal EF, moderate to severe LVH; BNP level of [phone] in 2011-12  . Chronic obstructive pulmonary disease ([place])    with asthmatic component  . ESRD (end stage renal disease) on dialysis [facility])    secondary hyperparathyroidism  . Hypertension    h/o hypertensive crisis with encephalopathy  . Obesity   . Tobacco abuse    Approximate consumption of 25 pack years; continuing at 0.5 pack per day    Patient Active Problem List   Diagnosis Date Noted  . Chronic low back pain 08/14/2019  . Heme positive stool 01/14/2014  . Chronic shoulder pain 06/15/2012  . OA (osteoarthritis) of knee 06/15/2012  . Hypertension   . Chronic obstructive pulmonary disease ([place])   . Chronic diastolic heart failure ([place])   . Tobacco abuse   . ED (erectile dysfunction) 10/11/2011  . Insomnia 08/11/2011  . ESRD (end stage renal disease) [place] dialysis [place] 06/28/2011  . Anemia of renal disease 06/28/2011  . Obesity 06/28/2011  . Congestive heart failure (CHF) ([place]) 02/11/2010    Past Surgical History:  Procedure Laterality Date  .  ARTERIOVENOUS GRAFT PLACEMENT  2011  . COLONOSCOPY N/A 02/04/2014   Procedure: COLONOSCOPY;  Surgeon: [name], MD;  Location: [facility] ENDO SUITE;  Service: Endoscopy;  Laterality: N/A;  12:00  . COLONOSCOPY N/A 07/03/2019   Procedure: COLONOSCOPY;  Surgeon: [name], MD;  Location: [facility] ENDO SUITE;  Service: Endoscopy;  Laterality: N/A;  12:00-office rescheduled 2/9 @ 11:00am  . POLYPECTOMY  07/03/2019   Procedure: POLYPECTOMY;  Surgeon: [name], MD;  Location: [facility] ENDO SUITE;  Service: Endoscopy;;       Family History  Problem Relation Age of Onset  . Heart disease Father   . Diabetes Sister   . Hypertension Sister   . Diabetes Sister   . Hypertension Sister   . Kidney disease Mother   . Hypertension Mother   . Hypertension Sister   . Hypertension Brother   . Hypertension Brother   . Hypertension Brother   . Colon cancer Neg Hx     Social History   Tobacco Use  . Smoking status: Current Every Day Smoker    Packs/day: 0.50    Years: 25.00    Pack years: 12.50    Types: Cigarettes  . Smokeless tobacco: Never Used  . Tobacco comment: 8-10 ciggs per day   Substance Use Topics  . Alcohol use: No    Comment: quit etoh about 2011. used to drink about 8-10 beers a week  .  Drug use: No    Home Medications Prior to Admission medications   Medication Sig Start Date End Date Taking? Authorizing Provider  albuterol (ACCUNEB) 1.25 MG/3ML nebulizer solution Take 1 ampule by nebulization as needed for wheezing or shortness of breath.   Yes [provider]  albuterol (PROAIR HFA) 108 (90 BASE) MCG/ACT inhaler Inhale 2 puffs into the lungs every 4 (four) hours as needed for wheezing. 02/11/14  Yes [place], Modena Nunnery, MD  aspirin EC 81 MG tablet Take 81 mg by mouth daily.   Yes [provider]  atorvastatin (LIPITOR) 10 MG tablet Take 10 mg by mouth daily.   Yes [provider]  AURYXIA 1 GM 210 MG(Fe) tablet Take 420 mg by mouth 3 (three) times daily  with meals.  11/02/16  Yes [provider]  b complex-vitamin c-folic acid (NEPHRO-VITE) 0.8 MG TABS Take 0.8 mg by mouth daily.    Yes [provider]  budesonide-formoterol (SYMBICORT) 80-4.5 MCG/ACT inhaler Inhale 2 puffs into the lungs 2 (two) times daily. 06/05/13  Yes , Modena Nunnery, MD  cinacalcet (SENSIPAR) 60 MG tablet Take 60 mg by mouth daily.   Yes [provider]  diphenhydramine-acetaminophen (TYLENOL PM) 25-500 MG TABS tablet Take 1 tablet by mouth at bedtime as needed (sleep).   Yes [provider]  DULoxetine (CYMBALTA) 30 MG capsule Take 30 mg by mouth daily. 11/08/19  Yes [provider]  gabapentin (NEURONTIN) 300 MG capsule Take 300 mg by mouth daily. 10/09/19  Yes [provider]  Omega-3 1000 MG CAPS Take 2,000 mg by mouth daily.   Yes [provider]  oxyCODONE-acetaminophen (PERCOCET) 7.5-325 MG tablet Take 1 tablet by mouth every 6 (six) hours as needed for moderate pain. 10/09/19  Yes [provider]  sevelamer carbonate (RENVELA) 800 MG tablet Take 1,600-3,200 mg by mouth See admin instructions. Take 3200 mg with meals and 1600 with snack   Yes [provider]    Allergies    Patient has no known allergies.  Review of Systems   Review of Systems  Constitutional: Negative for fever.  HENT: Negative for ear pain and sore throat.   Eyes: Negative for pain.  Respiratory: Negative for cough.   Cardiovascular: Positive for palpitations.  Gastrointestinal: Negative for abdominal pain.  Genitourinary: Negative for flank pain.  Musculoskeletal: Negative for back pain.  Skin: Negative for color change and rash.  Neurological: Negative for syncope.  All other systems reviewed and are negative.   Physical Exam Updated Vital Signs BP (!) 120/94   Pulse 88   Temp 98.4 F (36.9 C) (Oral)   Resp 15   Ht 6' (1.829 m)   Wt 115.2 kg   SpO2 98%   BMI 34.44 kg/m   Physical  Exam Constitutional:      General: He is not in acute distress.    Appearance: He is well-developed.  HENT:     Head: Normocephalic.     Nose: Nose normal.  Eyes:     Extraocular Movements: Extraocular movements intact.  Cardiovascular:     Rate and Rhythm: Regular rhythm. Tachycardia present.  Pulmonary:     Effort: Pulmonary effort is normal.  Skin:    Coloration: Skin is not jaundiced.  Neurological:     Mental Status: He is alert. Mental status is at baseline.     ED Results / Procedures / Treatments   Labs (all labs ordered are listed, but only abnormal results are displayed) Labs  Reviewed  BASIC METABOLIC PANEL - Abnormal; Notable for the following components:      Result Value   Glucose, Bld 106 (*)    BUN 51 (*)    Creatinine, Ser 10.55 (*)    Calcium 8.6 (*)    GFR, Estimated 5 (*)    All other components within normal limits  CBC WITH DIFFERENTIAL/PLATELET    EKG EKG Interpretation  Date/Time:  Wednesday August 06 2020 08:08:55 EDT Ventricular Rate:  105 PR Interval:    QRS Duration: 77 QT Interval:  304 QTC Calculation: 402 R Axis:   56 Text Interpretation: Sinus tachycardia Right atrial enlargement Probable anteroseptal infarct, old Borderline repolarization abnormality Confirmed by Thamas Jaegers ([id number]) on 08/06/2020 8:19:53 AM   Radiology LONG TERM MONITOR (3-14 DAYS)  Result Date: 08/06/2020  14 day event monitor  Rare supraventricular ectopy. Rare episodes of SVT up to 14 beats.  Rare ventricular ectopy. One episode of NSVT lasting 6 beats.  Symptoms reported but without sufficent data to correlate with heart rhythm at the time.  Patch Wear Time:  14 days and 0 hours (2022-02-22T18:05:23-0500 to 2022-03-08T18:05:27-0500) Patient had a min HR of 64 bpm, max HR of 207 bpm, and avg HR of 92 bpm. Predominant underlying rhythm was Sinus Rhythm. 1 run of Ventricular Tachycardia occurred lasting 6 beats with a max rate of 160 bpm (avg 148 bpm). 7  Supraventricular Tachycardia runs occurred, the run with the fastest interval lasting 14 beats with a max rate of 207 bpm (avg 165 bpm); the run with the fastest interval was also the longest. Isolated SVEs were rare (<1.0%), SVE Couplets were rare (<1.0%), and SVE Triplets were rare (<1.0%). Isolated VEs were rare (<1.0%), VE Couplets were rare (<1.0%), and no VE Triplets were present. Ventricular Trigeminy was present. Inverted QRS complexes possibly due to inverted placement of device.    Procedures .Critical Care Performed by: Luna Fuse, MD Authorized by: Luna Fuse, MD   Critical care provider statement:    Critical care time (minutes):  30   Critical care time was exclusive of:  Separately billable procedures and treating other patients and teaching time   Critical care was necessary to treat or prevent imminent or life-threatening deterioration of the following conditions:  Cardiac failure     Medications Ordered in ED Medications  adenosine (ADENOCARD) 6 MG/2ML injection 6 mg (6 mg Intravenous Given 08/06/20 0804)  adenosine (ADENOCARD) 6 MG/2ML injection 12 mg (12 mg Intravenous Given 08/06/20 7035)    ED Course  I have reviewed the triage vital signs and the nursing notes.  Pertinent labs & imaging results that were available during my care of the patient were reviewed by me and considered in my medical decision making (see chart for details).    MDM Rules/Calculators/A&P                          Patient found to be in rapid ventricular rate, rate of about 170 bpm.  Appears very narrow and regular.  Straight leg raise on effective with Valsalva maneuver ineffective.  Given adenosine 6 mg IV with no significant break other than a mild pause.  Given adenosine 12 mg IV and patient was able to convert back to sinus rhythm.  Heart rate about 90 to 100 bpm.  Further monitored in the ER for 2 hours with no additional adverse events and no tachycardic episodes recurrent.   I did consider starting him  on a low-dose beta-blocker, however blood pressures slightly low at about 753 systolic.  Advised him to call his cardiologist today to schedule a sooner appointment with the cardiologist, advised to return if he has recurrent episodes of tachycardia or any additional concerns.   Final Clinical Impression(s) / ED Diagnoses Final diagnoses:  SVT (supraventricular tachycardia) ([place])    Rx / DC Orders ED Discharge Orders    None       Luna Fuse, MD 08/06/20 1022

## 2020-08-07 ENCOUNTER — Telehealth: Payer: Self-pay | Admitting: *Deleted

## 2020-08-07 NOTE — Telephone Encounter (Signed)
Laurine Blazer, LPN  7/93/9688 64:84 AM EDT Back to Top     Patient notified and verbalized understanding.  OV moved up from 08/18/20 to Monday, 08/11/2020 with Katina Dung, NP - appt change based off recent ed visit.    Verta Ellen., NP  08/06/2020 6:30 PM EDT      Please call the patient and let him know the monitor showed rare episodes of fast heart rate. The longest was 14 beats. One episode of a wide complex tachcardia only 6 beats during the 14 days he was monitored. He reported symptoms but physician stated there was not enough data to relate it to the heart rhythm. Thanks

## 2020-08-08 DIAGNOSIS — Z992 Dependence on renal dialysis: Secondary | ICD-10-CM | POA: Diagnosis not present

## 2020-08-08 DIAGNOSIS — N186 End stage renal disease: Secondary | ICD-10-CM | POA: Diagnosis not present

## 2020-08-10 NOTE — Progress Notes (Signed)
Cardiology Office Note  Date: 08/11/2020   ID: Brian Barrera, DOB 18-Feb-1971, MRN [id number]  PCP:  Curlene Labrum, MD  Cardiologist:  Carlyle Dolly, MD Electrophysiologist:  None   Chief Complaint: Hospital follow up SVT  History of Present Illness: Brian Barrera is a 50 y.o. male with a history of Chronic diastolic HF, SVT, anemia, essential hypertension, COPD, ESRD, tobacco abuse, obesity.  Last encounter with Dr. Harl Bowie 04/12/2019.  Patient was seen for evaluation for renal transplant.  No chest pain, shortness of breath, DOE.  As part of work-up at [facility] patient had an echo with normal EF, no significant pathology.  Stress echo was attempted but unable to reach target heart rate.  Nuclear imaging stress test was ordered.  Patient presented to [facility], ED 07/02/2020 with arrhythmia.  Had completed one half of dialysis when heart rate was noted to be in the 180s.  He had palpitations in his chest.  He denied any history of SVT or atrial fibrillation.  Reported mild chest discomfort.  He was cardioverted chemically with IV adenosine.  He was asymptomatic following cardioversion with heart rate less than 100.  He was advised to follow-up with cardiology  He is here for follow-up status post episode of SVT with presentation to ED resolved with 12 mg dose of adenosine.  He states he is never had any episodes of arrhythmias in the past.  States he has had no recurrent episodes since the visit to the emergency room.  He denies any other symptoms such as anginal, dyspnea or shortness of breath, orthostatic symptoms, CVA or TIA-like symptoms, PND, orthopnea.  No bleeding, claudication-like symptoms, DVT or PE-like symptoms, or lower extremity edema.  Had an ED visit to [facility] emergency room on 08/06/2020 with complaints of palpitations chest discomfort starting during dialysis.  He was halfway through dialysis before he noted to be severely tachycardic at 160-170.[name]  He received IV  adenosine once again as he did at previous visit on 07/02/2020 with conversion back to normal sinus rhythm.   He is here today for follow-up.  He states he is frustrated due to the recent 2 episodes of rapid heart rate in the middle of his dialysis treatments.  He states about 2 hours into the dialysis session he has been having the heart rate speed up.  He states that dialysis sessions are pulling more than his usual dry weight of approximately 1 kg beyond his dry weight.  He states he is wondering if this may have some relationship to his rapid heart rate.  We discussed the recent monitor results with no significant sustained arrhythmias.  Blood pressure is 104/78 today.  He denies any anginal or exertional symptoms, palpitations or arrhythmias.  He states the reason he triggered his monitor was because it was falling off during the time he was trying to take a shower.  He is concerned the arrhythmia may happen again during dialysis.   Past Medical History:  Diagnosis Date  . Anemia    Attributed to chronic disease/chronic kidney disease  . Chronic diastolic heart failure ([place]) 2011   EF 35% by echo in 2011; echo in 08/2011-normal EF, moderate to severe LVH; BNP level of [phone] in 2011-12  . Chronic obstructive pulmonary disease ([place])    with asthmatic component  . ESRD (end stage renal disease) on dialysis [facility])    secondary hyperparathyroidism  . Hypertension    h/o hypertensive crisis with encephalopathy  . Obesity   .  Tobacco abuse    Approximate consumption of 25 pack years; continuing at 0.5 pack per day    Past Surgical History:  Procedure Laterality Date  . ARTERIOVENOUS GRAFT PLACEMENT  2011  . COLONOSCOPY N/A 02/04/2014   Procedure: COLONOSCOPY;  Surgeon: [name], MD;  Location: [facility];  Service: Endoscopy;  Laterality: N/A;  12:00  . COLONOSCOPY N/A 07/03/2019   Procedure: COLONOSCOPY;  Surgeon: [name], MD;  Location: [facility];  Service: Endoscopy;   Laterality: N/A;  12:00-office rescheduled 2/9 @ 11:00am  . POLYPECTOMY  07/03/2019   Procedure: POLYPECTOMY;  Surgeon: [name], MD;  Location: [facility];  Service: Endoscopy;;    Current Outpatient Medications  Medication Sig Dispense Refill  . albuterol (ACCUNEB) 1.25 MG/3ML nebulizer solution Take 1 ampule by nebulization as needed for wheezing or shortness of breath.    [name] albuterol (PROAIR HFA) 108 (90 BASE) MCG/ACT inhaler Inhale 2 puffs into the lungs every 4 (four) hours as needed for wheezing. 3 Inhaler 0  . aspirin EC 81 MG tablet Take 81 mg by mouth daily.    [name] atorvastatin (LIPITOR) 10 MG tablet Take 10 mg by mouth daily.    Lorin Picket 1 GM 210 MG(Fe) tablet Take 420 mg by mouth 3 (three) times daily with meals.     [name] b complex-vitamin c-folic acid (NEPHRO-VITE) 0.8 MG TABS Take 0.8 mg by mouth daily.     . budesonide-formoterol (SYMBICORT) 80-4.5 MCG/ACT inhaler Inhale 2 puffs into the lungs 2 (two) times daily. 1 Inhaler 12  . cinacalcet (SENSIPAR) 60 MG tablet Take 60 mg by mouth daily.    . diphenhydramine-acetaminophen (TYLENOL PM) 25-500 MG TABS tablet Take 1 tablet by mouth at bedtime as needed (sleep).    . DULoxetine (CYMBALTA) 30 MG capsule Take 30 mg by mouth daily.    [name] gabapentin (NEURONTIN) 300 MG capsule Take 300 mg by mouth daily.    . Omega-3 1000 MG CAPS Take 2,000 mg by mouth daily.    [name] oxyCODONE-acetaminophen (PERCOCET) 7.5-325 MG tablet Take 1 tablet by mouth every 6 (six) hours as needed for moderate pain.    . sevelamer carbonate (RENVELA) 800 MG tablet Take 1,600-3,200 mg by mouth See admin instructions. Take 3200 mg with meals and 1600 with snack     No current facility-administered medications for this visit.   Allergies:  Patient has no known allergies.   Social History: The patient  reports that he has been smoking cigarettes. He has a 12.50 pack-year smoking history. He has never used smokeless tobacco. He reports that he does not drink  alcohol and does not use drugs.   Family History: The patient's family history includes Diabetes in his sister and sister; Heart disease in his father; Hypertension in his brother, brother, brother, mother, sister, sister, and sister; Kidney disease in his mother.   ROS:  Please see the history of present illness. Otherwise, complete review of systems is positive for none.  All other systems are reviewed and negative.   Physical Exam: VS:  BP 104/78   Pulse 82   Ht 6' (1.829 m)   Wt 257 lb (116.6 kg)   SpO2 98%   BMI 34.86 kg/m , BMI Body mass index is 34.86 kg/m.  Wt Readings from Last 3 Encounters:  08/11/20 257 lb (116.6 kg)  08/06/20 253 lb 15.5 oz (115.2 kg)  07/10/20 252 lb (114.3 kg)    General: Patient appears comfortable at  rest. Neck: Supple, no elevated JVP or carotid bruits, no thyromegaly. Lungs: Clear to auscultation, nonlabored breathing at rest. Cardiac: Regular rate and rhythm, no S3 or significant systolic murmur, no pericardial rub. Extremities: No pitting edema, distal pulses 2+. Skin: Warm and dry. Musculoskeletal: No kyphosis. Neuropsychiatric: Alert and oriented x3, affect grossly appropriate.  ECG:  EKG at [facility], ED 07/02/2020 showed sinus tachycardia rate of 116, right atrial enlargement, consider biatrial enlargement, low voltage, precordial leads.  Recent Labwork: 07/02/2020: Magnesium 2.9 08/06/2020: BUN 51; Creatinine, Ser 10.55; Hemoglobin 14.9; Platelets 193; Potassium 4.0; Sodium 138     Component Value Date/Time   CHOL 175 01/04/2013 1140   TRIG 172 (H) 01/04/2013 1140   HDL 45 01/04/2013 1140   CHOLHDL 3.9 01/04/2013 1140   VLDL 34 01/04/2013 1140   LDLCALC 96 01/04/2013 1140    Other Studies Reviewed Today:  Cardiac monitor 08/06/2020 Study Highlights    14 day event monitor  Rare supraventricular ectopy. Rare episodes of SVT up to 14 beats.  Rare ventricular ectopy. One episode of NSVT lasting 6 beats.  Symptoms reported  but without sufficent data to correlate with heart rhythm at the time.    Patch Wear Time:  14 days and 0 hours (2022-02-22T18:05:23-0500 to 2022-03-08T18:05:27-0500)  Patient had a min HR of 64 bpm, max HR of 207 bpm, and avg HR of 92 bpm. Predominant underlying rhythm was Sinus Rhythm. 1 run of Ventricular Tachycardia occurred lasting 6 beats with a max rate of 160 bpm (avg 148 bpm). 7 Supraventricular Tachycardia  runs occurred, the run with the fastest interval lasting 14 beats with a max rate of 207 bpm (avg 165 bpm); the run with the fastest interval was also the longest. Isolated SVEs were rare (<1.0%), SVE Couplets were rare (<1.0%), and SVE Triplets were  rare (<1.0%). Isolated VEs were rare (<1.0%), VE Couplets were rare (<1.0%), and no VE Triplets were present. Ventricular Trigeminy was present. Inverted QRS complexes possibly due to inverted placement of device.     10/2018 [facility] stress echo Protocol: Treadmill (Modified Bruce)       Drugs: Beta Blockers  Target Heart Rate: 145 Maximum Predicted Heart Rate: 171    Resting ECG: Sinus rhythm   Protocol Note: LABETOLOL 200mg  BID, LAST DOSE 11/17/2018 AM    TYPE   STAGE  TIME  HR  BP  RPE SPO2 COMMENTS  -------- ----- --------- --- ------- ----- ---- ------------------------------  Baseline         90 105/ 63  BaseLine  1   6    90 105/ 63  BaseLine  2   1    88 97/ 67      STANDING BP  Stress   1  180 sec. 105 130/ 64 7/20  Stress   2  180 sec. 113 130/ 74 11/20  Stress   3  121 sec. 118 140/ 72 15/20   LEG DISCOMFORT 7/10  Recovery  1   1 min. 110 112/ 65  Recovery  2   2 min. 103 115/ 55  Recovery  3   4 min. 88 131/ 61  Recovery  4   6 min. 90 122/ 65  Recovery  5   8 min. 87 120/ 66      LEG DISCOMFORT RESOLVED    Stress Duration: 8.02 minutes. Max Stress H.R: 118  Target Heart Rate (145) Achieved:  No  Max. workload of 4.60 METs was achieved during exercise.  HR Response: Attenuated secondary to medication    BP Response: Normal resting BP - blunted response    WALL SEGMENT FINDINGS --------------------------------------------------------               Rest      Stress             --------------- ---------------  Anterior Septum Basal: Normal     Hyperkinetic          Mid: Normal     Hyperkinetic     Apical Septum: Normal     Hyperkinetic    Anterior Wall Basal: Normal     Hyperkinetic          Mid: Normal     Hyperkinetic         Apical: Normal     Hyperkinetic     Lateral Wall Basal: Normal     Hyperkinetic          Mid: Normal     Hyperkinetic         Apical: Normal     Hyperkinetic    Posterior Wall Basal: Normal     Hyperkinetic          Mid: Normal     Hyperkinetic    Inferior Wall Basal: Normal     Hyperkinetic          Mid: Normal     Hyperkinetic         Apical: Normal     Hyperkinetic    Inferior Septum Basal: Normal     Hyperkinetic          Mid: Normal     Hyperkinetic             EF: >55%      >55%      ADDITIONAL FINDINGS ----------------------------------------------------------    Chest Discomfort: None     Arrhythmia: None  Termination Reason: Leg fatigue    Complications: None    STRESS ECG RESULTS -----------------------------------------------------------    ECG Results: Nondiagnostic due to inadequate heart rate. .    INTERPRETATION ---------------------------------------------------------------    INDETERMINATE.  NO DOPPLER PERFORMED FOR VALVULAR REGURGITATION  NO DOPPLER PERFORMED FOR VALVULAR STENOSIS  Note: NO WALL MOTION ABNORMALITIES AT REST OR SUBMAXIMAL STRESS  (PEAK  STRESS <118 bpm, TARGET = 145 bpm)  Maximum workload of 4.60 METs was achieved during exercise.  NORMAL RESTING BP - BLUNTED RESPONSE   10/2018 echo INTERPRETATION ---------------------------------------------------------------  NORMAL LEFT VENTRICULAR SYSTOLIC FUNCTION WITH MODERATE LVH  NORMAL LA PRESSURES WITH NORMAL DIASTOLIC FUNCTION  NORMAL RIGHT VENTRICULAR SYSTOLIC FUNCTION  VALVULAR REGURGITATION: TRIVIAL MR, TRIVIAL TR  NO VALVULAR STENOSIS  NO PRIOR STUDY FOR COMPARISON   NST 04/23/2019 Study Result  Narrative & Impression   No diagnostic ST segment changes to indicate ischemia.  Small, mild intensity, inferior defect that is partially reversible at the apex in the setting of increased radiotracer uptake on rest imaging near the inferior wall. Otherwise defect is fixed and suggestive of soft tissue attenuation. Cannot exclude a mild inferior apical ischemic territory, although with reduced specificity.  This is a low risk study.  Nuclear stress EF: 53%.        Assessment and Plan:   1. SVT (supraventricular tachycardia) (HCC) Recent episode of SVT during dialysis.  He presented to [facility], ED where he was chemically cardioverted with 12 mg of adenosine IV.  Heart rate returned to less than 100.  He is here for follow-up for review of cardiac monitor  and he had a recent repeat episode of SVT resolved with adenosine on 08/06/2020 at [facility], ED.  Advised him we could use Cardizem 30 mg p.o. as needed twice daily for palpitations/rapid heart rates.  Advised him if this occurred during dialysis again to take 130 mg p.o. approximately 45 minutes prior to the end of dialysis and if rapid heart rate persisted after dialysis to take the second dose of 30 mg.  He verbalizes understanding.   2. Essential hypertension Blood pressure is well controlled today at BP today 104/78.  Currently not on any antihypertensive medications.  3. Chronic  diastolic heart failure [facility]) Last echocardiogram June 2020 showed normal LV function with moderate LVH.  Normal diastolic function.  Trivial MR, trivial TR.  Denies any shortness of breath, DOE, weight gain, lower extremity edema.  4. ESRD (end stage renal disease) on dialysis [facility]) Receives dialysis on Mondays Wednesdays and Fridays.  He states he believes he is getting too much fluid taken off which may be contributing to his recent 2 episodes of SVT.  Advised him to talk to the PA who he sees twice a week at the dialysis center Mr. Dejohn Ibarra.  5.  Hyperlipidemia He states his primary care provider recently started him on Lipitor.  Continue atorvastatin 10 mg daily.  Continue aspirin 81 mg daily.  Medication Adjustments/Labs and Tests Ordered: Current medicines are reviewed at length with the patient today.  Concerns regarding medicines are outlined above.   Disposition: Follow-up with Dr. Harl Bowie or APP 3 months. Signed, Levell July, NP 08/11/2020 9:50 AM    [facility] [address], [place], [place] [address] Phone: [phone]; Fax: [phone]

## 2020-08-11 ENCOUNTER — Encounter: Payer: Self-pay | Admitting: Family Medicine

## 2020-08-11 ENCOUNTER — Ambulatory Visit (INDEPENDENT_AMBULATORY_CARE_PROVIDER_SITE_OTHER): Payer: Medicare Other | Admitting: Family Medicine

## 2020-08-11 VITALS — BP 104/78 | HR 82 | Ht 72.0 in | Wt 257.0 lb

## 2020-08-11 DIAGNOSIS — E785 Hyperlipidemia, unspecified: Secondary | ICD-10-CM

## 2020-08-11 DIAGNOSIS — I471 Supraventricular tachycardia: Secondary | ICD-10-CM | POA: Diagnosis not present

## 2020-08-11 DIAGNOSIS — I10 Essential (primary) hypertension: Secondary | ICD-10-CM

## 2020-08-11 DIAGNOSIS — Z992 Dependence on renal dialysis: Secondary | ICD-10-CM

## 2020-08-11 DIAGNOSIS — N186 End stage renal disease: Secondary | ICD-10-CM | POA: Diagnosis not present

## 2020-08-11 DIAGNOSIS — I5032 Chronic diastolic (congestive) heart failure: Secondary | ICD-10-CM

## 2020-08-11 MED ORDER — DILTIAZEM HCL 30 MG PO TABS: 30.0000 mg | ORAL_TABLET | Freq: Two times a day (BID) | ORAL | 6 refills | Status: DC | PRN

## 2020-08-11 NOTE — Patient Instructions (Signed)
Medication Instructions:   Begin Cardizem 30mg  twice a day as needed for rapid heart rate.  Continue all other medications.    Labwork: none  Testing/Procedures: none  Follow-Up: 3 months   Any Other Special Instructions Will Be Listed Below (If Applicable).  If you need a refill on your cardiac medications before your next appointment, please call your pharmacy.

## 2020-08-13 DIAGNOSIS — Z992 Dependence on renal dialysis: Secondary | ICD-10-CM | POA: Diagnosis not present

## 2020-08-13 DIAGNOSIS — N186 End stage renal disease: Secondary | ICD-10-CM | POA: Diagnosis not present

## 2020-08-14 ENCOUNTER — Other Ambulatory Visit: Payer: Self-pay

## 2020-08-14 ENCOUNTER — Encounter: Payer: Self-pay | Admitting: Orthopedic Surgery

## 2020-08-14 ENCOUNTER — Ambulatory Visit (INDEPENDENT_AMBULATORY_CARE_PROVIDER_SITE_OTHER): Payer: Medicare Other | Admitting: Orthopedic Surgery

## 2020-08-14 VITALS — BP 140/94 | HR 93 | Ht 72.0 in | Wt 257.0 lb

## 2020-08-14 DIAGNOSIS — M1712 Unilateral primary osteoarthritis, left knee: Secondary | ICD-10-CM | POA: Diagnosis not present

## 2020-08-14 DIAGNOSIS — M1710 Unilateral primary osteoarthritis, unspecified knee: Secondary | ICD-10-CM

## 2020-08-14 NOTE — Progress Notes (Signed)
Chief Complaint  Patient presents with  . Knee Pain    Left said injection only helped for 3 days but wants another injection did not want to see another doctor    50 year old male with multiple medical problems.  I sent him to [facility] for evaluation for possible knee replacement.  His medical problems are too much for her hospital to handle for that procedure  At his request we will repeat the injection  left knee celestone 6 mg  lidocaine 1% lateral approach left knee patient gave consent Timeout was taken no complications  Past Medical History:  Diagnosis Date  . Anemia    Attributed to chronic disease/chronic kidney disease  . Chronic diastolic heart failure ([place]) 2011   EF 35% by echo in 2011; echo in 08/2011-normal EF, moderate to severe LVH; BNP level of [phone] in 2011-12  . Chronic obstructive pulmonary disease ([place])    with asthmatic component  . ESRD (end stage renal disease) on dialysis [facility])    secondary hyperparathyroidism  . Hypertension    h/o hypertensive crisis with encephalopathy  . Obesity   . Tobacco abuse    Approximate consumption of 25 pack years; continuing at 0.5 pack per day

## 2020-08-15 DIAGNOSIS — Z992 Dependence on renal dialysis: Secondary | ICD-10-CM | POA: Diagnosis not present

## 2020-08-15 DIAGNOSIS — N186 End stage renal disease: Secondary | ICD-10-CM | POA: Diagnosis not present

## 2020-08-18 ENCOUNTER — Ambulatory Visit: Payer: Medicare Other | Admitting: Family Medicine

## 2020-08-18 DIAGNOSIS — N186 End stage renal disease: Secondary | ICD-10-CM | POA: Diagnosis not present

## 2020-08-18 DIAGNOSIS — Z992 Dependence on renal dialysis: Secondary | ICD-10-CM | POA: Diagnosis not present

## 2020-08-18 DIAGNOSIS — N2581 Secondary hyperparathyroidism of renal origin: Secondary | ICD-10-CM | POA: Diagnosis not present

## 2020-08-20 DIAGNOSIS — Z992 Dependence on renal dialysis: Secondary | ICD-10-CM | POA: Diagnosis not present

## 2020-08-20 DIAGNOSIS — N186 End stage renal disease: Secondary | ICD-10-CM | POA: Diagnosis not present

## 2020-08-21 ENCOUNTER — Ambulatory Visit: Payer: Medicare Other | Admitting: Family Medicine

## 2020-08-21 DIAGNOSIS — Z992 Dependence on renal dialysis: Secondary | ICD-10-CM | POA: Diagnosis not present

## 2020-08-21 DIAGNOSIS — N186 End stage renal disease: Secondary | ICD-10-CM | POA: Diagnosis not present

## 2020-08-22 DIAGNOSIS — N186 End stage renal disease: Secondary | ICD-10-CM | POA: Diagnosis not present

## 2020-08-22 DIAGNOSIS — Z992 Dependence on renal dialysis: Secondary | ICD-10-CM | POA: Diagnosis not present

## 2020-08-25 DIAGNOSIS — Z992 Dependence on renal dialysis: Secondary | ICD-10-CM | POA: Diagnosis not present

## 2020-08-25 DIAGNOSIS — N186 End stage renal disease: Secondary | ICD-10-CM | POA: Diagnosis not present

## 2020-08-27 DIAGNOSIS — Z992 Dependence on renal dialysis: Secondary | ICD-10-CM | POA: Diagnosis not present

## 2020-08-27 DIAGNOSIS — N186 End stage renal disease: Secondary | ICD-10-CM | POA: Diagnosis not present

## 2020-08-29 DIAGNOSIS — N186 End stage renal disease: Secondary | ICD-10-CM | POA: Diagnosis not present

## 2020-08-29 DIAGNOSIS — Z992 Dependence on renal dialysis: Secondary | ICD-10-CM | POA: Diagnosis not present

## 2020-09-01 DIAGNOSIS — Z992 Dependence on renal dialysis: Secondary | ICD-10-CM | POA: Diagnosis not present

## 2020-09-01 DIAGNOSIS — N186 End stage renal disease: Secondary | ICD-10-CM | POA: Diagnosis not present

## 2020-09-03 ENCOUNTER — Other Ambulatory Visit: Payer: Self-pay

## 2020-09-03 ENCOUNTER — Encounter (HOSPITAL_COMMUNITY): Payer: Self-pay | Admitting: Emergency Medicine

## 2020-09-03 ENCOUNTER — Emergency Department (HOSPITAL_COMMUNITY)
Admission: EM | Admit: 2020-09-03 | Discharge: 2020-09-03 | Disposition: A | Discharge status: Home / Self Care | Payer: Medicare Other | Attending: Emergency Medicine | Admitting: Emergency Medicine

## 2020-09-03 DIAGNOSIS — N2581 Secondary hyperparathyroidism of renal origin: Secondary | ICD-10-CM | POA: Diagnosis not present

## 2020-09-03 DIAGNOSIS — Z79899 Other long term (current) drug therapy: Secondary | ICD-10-CM | POA: Insufficient documentation

## 2020-09-03 DIAGNOSIS — Z992 Dependence on renal dialysis: Secondary | ICD-10-CM | POA: Insufficient documentation

## 2020-09-03 DIAGNOSIS — R6889 Other general symptoms and signs: Secondary | ICD-10-CM | POA: Diagnosis not present

## 2020-09-03 DIAGNOSIS — F17210 Nicotine dependence, cigarettes, uncomplicated: Secondary | ICD-10-CM | POA: Diagnosis not present

## 2020-09-03 DIAGNOSIS — I471 Supraventricular tachycardia: Secondary | ICD-10-CM | POA: Diagnosis not present

## 2020-09-03 DIAGNOSIS — N186 End stage renal disease: Secondary | ICD-10-CM | POA: Insufficient documentation

## 2020-09-03 DIAGNOSIS — I5032 Chronic diastolic (congestive) heart failure: Secondary | ICD-10-CM | POA: Insufficient documentation

## 2020-09-03 DIAGNOSIS — R000 Tachycardia, unspecified: Secondary | ICD-10-CM | POA: Insufficient documentation

## 2020-09-03 DIAGNOSIS — I132 Hypertensive heart and chronic kidney disease with heart failure and with stage 5 chronic kidney disease, or end stage renal disease: Secondary | ICD-10-CM | POA: Diagnosis not present

## 2020-09-03 DIAGNOSIS — Z7982 Long term (current) use of aspirin: Secondary | ICD-10-CM | POA: Insufficient documentation

## 2020-09-03 DIAGNOSIS — D509 Iron deficiency anemia, unspecified: Secondary | ICD-10-CM | POA: Diagnosis not present

## 2020-09-03 DIAGNOSIS — I499 Cardiac arrhythmia, unspecified: Secondary | ICD-10-CM | POA: Diagnosis not present

## 2020-09-03 DIAGNOSIS — Z743 Need for continuous supervision: Secondary | ICD-10-CM | POA: Diagnosis not present

## 2020-09-03 DIAGNOSIS — R0689 Other abnormalities of breathing: Secondary | ICD-10-CM | POA: Diagnosis not present

## 2020-09-03 LAB — CBC
HCT: 52.2 % — ABNORMAL HIGH (ref 39.0–52.0)
Hemoglobin: 16.2 g/dL (ref 13.0–17.0)
MCH: 30.6 pg (ref 26.0–34.0)
MCHC: 31 g/dL (ref 30.0–36.0)
MCV: 98.7 fL (ref 80.0–100.0)
Platelets: 202 10*3/uL (ref 150–400)
RBC: 5.29 MIL/uL (ref 4.22–5.81)
RDW: 14.7 % (ref 11.5–15.5)
WBC: 9.1 10*3/uL (ref 4.0–10.5)
nRBC: 0 % (ref 0.0–0.2)

## 2020-09-03 LAB — BASIC METABOLIC PANEL
Anion gap: 15 (ref 5–15)
BUN: 46 mg/dL — ABNORMAL HIGH (ref 6–20)
CO2: 25 mmol/L (ref 22–32)
Calcium: 8.7 mg/dL — ABNORMAL LOW (ref 8.9–10.3)
Chloride: 96 mmol/L — ABNORMAL LOW (ref 98–111)
Creatinine, Ser: 11.63 mg/dL — ABNORMAL HIGH (ref 0.61–1.24)
GFR, Estimated: 5 mL/min — ABNORMAL LOW (ref >60)
Glucose, Bld: 109 mg/dL — ABNORMAL HIGH (ref 70–99)
Potassium: 3.9 mmol/L (ref 3.5–5.1)
Sodium: 136 mmol/L (ref 135–145)

## 2020-09-03 MED ORDER — ADENOSINE 6 MG/2ML IV SOLN: 6.0000 mg | Freq: Once | INTRAVENOUS | Status: AC

## 2020-09-03 MED ORDER — ADENOSINE 6 MG/2ML IV SOLN
INTRAVENOUS | Status: AC
  Administered 2020-09-03: 6 mg via INTRAVENOUS
  Filled 2020-09-03: qty 2

## 2020-09-03 MED ORDER — ADENOSINE 6 MG/2ML IV SOLN
INTRAVENOUS | Status: AC
  Filled 2020-09-03: qty 2

## 2020-09-03 NOTE — Discharge Instructions (Addendum)
It was our pleasure to provide your ER care today - we hope that you feel better.  Follow up with your doctor/cardiologist in the next 1-2 weeks.   Return to ER if worse, new symptoms, persistent fast heart beat, fainting, chest pain, trouble breathing, or other concern.

## 2020-09-03 NOTE — ED Notes (Signed)
adenosine given with MD at bedside. Pt tolerated well. VSS. Pt lying in bed in no acute distress. Will continue to monitor patient.

## 2020-09-03 NOTE — ED Provider Notes (Signed)
[facility] EMERGENCY DEPARTMENT Provider Note   CSN: [id number] Arrival date & time: 09/03/20  0623     History Chief Complaint  Patient presents with  . Tachycardia    Brian Barrera is a 50 y.o. male.  Patient with hx esrd/hd, svt, was at dialysis today, ~ 1.5 hrs into session, when had onset palpitations c/w prior episodes svt. Symptoms acute onset, moderate, constant, persistent. Pt took his cardizem but symptoms persist. No chest pain or discomfort. No sob. No syncope. States compliant w normal meds and was having normal dialysis. No fever or chills. No cough or uri symptoms. No nvd.   The history is provided by the patient and the EMS personnel.       Past Medical History:  Diagnosis Date  . Anemia    Attributed to chronic disease/chronic kidney disease  . Chronic diastolic heart failure ([place]) 2011   EF 35% by echo in 2011; echo in 08/2011-normal EF, moderate to severe LVH; BNP level of [phone] in 2011-12  . Chronic obstructive pulmonary disease ([place])    with asthmatic component  . ESRD (end stage renal disease) on dialysis [facility])    secondary hyperparathyroidism  . Hypertension    h/o hypertensive crisis with encephalopathy  . Obesity   . Tobacco abuse    Approximate consumption of 25 pack years; continuing at 0.5 pack per day    Patient Active Problem List   Diagnosis Date Noted  . Chronic low back pain 08/14/2019  . Heme positive stool 01/14/2014  . Chronic shoulder pain 06/15/2012  . OA (osteoarthritis) of knee 06/15/2012  . Hypertension   . Chronic obstructive pulmonary disease ([place])   . Chronic diastolic heart failure ([place])   . Tobacco abuse   . ED (erectile dysfunction) 10/11/2011  . Insomnia 08/11/2011  . ESRD (end stage renal disease) on dialysis ([place]) 06/28/2011  . Anemia of renal disease 06/28/2011  . Obesity 06/28/2011  . Congestive heart failure (CHF) ([place]) 02/11/2010    Past Surgical History:  Procedure Laterality Date  .  ARTERIOVENOUS GRAFT PLACEMENT  2011  . COLONOSCOPY N/A 02/04/2014   Procedure: COLONOSCOPY;  Surgeon: [name], MD;  Location: [facility] ENDO SUITE;  Service: Endoscopy;  Laterality: N/A;  12:00  . COLONOSCOPY N/A 07/03/2019   Procedure: COLONOSCOPY;  Surgeon: [name], MD;  Location: [facility] ENDO SUITE;  Service: Endoscopy;  Laterality: N/A;  12:00-office rescheduled 2/9 @ 11:00am  . POLYPECTOMY  07/03/2019   Procedure: POLYPECTOMY;  Surgeon: [name], MD;  Location: [facility] ENDO SUITE;  Service: Endoscopy;;       Family History  Problem Relation Age of Onset  . Heart disease Father   . Diabetes Sister   . Hypertension Sister   . Diabetes Sister   . Hypertension Sister   . Kidney disease Mother   . Hypertension Mother   . Hypertension Sister   . Hypertension Brother   . Hypertension Brother   . Hypertension Brother   . Colon cancer Neg Hx     Social History   Tobacco Use  . Smoking status: Current Every Day Smoker    Packs/day: 0.50    Years: 25.00    Pack years: 12.50    Types: Cigarettes  . Smokeless tobacco: Never Used  . Tobacco comment: 8-10 ciggs per day   Substance Use Topics  . Alcohol use: No    Comment: quit etoh about 2011. used to drink about 8-10 beers a week  . Drug  use: No    Home Medications Prior to Admission medications   Medication Sig Start Date End Date Taking? Authorizing Provider  albuterol (ACCUNEB) 1.25 MG/3ML nebulizer solution Take 1 ampule by nebulization as needed for wheezing or shortness of breath.    [provider]  albuterol (PROAIR HFA) 108 (90 BASE) MCG/ACT inhaler Inhale 2 puffs into the lungs every 4 (four) hours as needed for wheezing. 02/11/14   [name], MD  aspirin EC 81 MG tablet Take 81 mg by mouth daily.    [provider]  atorvastatin (LIPITOR) 10 MG tablet Take 10 mg by mouth daily.    [provider]  AURYXIA 1 GM 210 MG(Fe) tablet Take 420 mg by mouth 3 (three) times daily with meals.   11/02/16   [provider]  b complex-vitamin c-folic acid (NEPHRO-VITE) 0.8 MG TABS Take 0.8 mg by mouth daily.     [provider]  budesonide-formoterol (SYMBICORT) 80-4.5 MCG/ACT inhaler Inhale 2 puffs into the lungs 2 (two) times daily. 06/05/13   [name], MD  cinacalcet (SENSIPAR) 60 MG tablet Take 60 mg by mouth daily.    [provider]  diltiazem (CARDIZEM) 30 MG tablet Take 1 tablet (30 mg total) by mouth 2 (two) times daily as needed (rapid heart rate). 08/11/20   Verta Ellen., NP  diphenhydramine-acetaminophen (TYLENOL PM) 25-500 MG TABS tablet Take 1 tablet by mouth at bedtime as needed (sleep).    [provider]  DULoxetine (CYMBALTA) 30 MG capsule Take 30 mg by mouth daily. 11/08/19   [provider]  gabapentin (NEURONTIN) 300 MG capsule Take 300 mg by mouth daily. 10/09/19   [provider]  Omega-3 1000 MG CAPS Take 2,000 mg by mouth daily.    [provider]  oxyCODONE-acetaminophen (PERCOCET) 7.5-325 MG tablet Take 1 tablet by mouth every 6 (six) hours as needed for moderate pain. 10/09/19   [provider]  sevelamer carbonate (RENVELA) 800 MG tablet Take 1,600-3,200 mg by mouth See admin instructions. Take 3200 mg with meals and 1600 with snack    [provider]    Allergies    Patient has no known allergies.  Review of Systems   Review of Systems  Constitutional: Negative for fever.  HENT: Negative for sore throat.   Eyes: Negative for redness.  Respiratory: Negative for shortness of breath.   Cardiovascular: Positive for palpitations. Negative for chest pain and leg swelling.  Gastrointestinal: Negative for abdominal pain and vomiting.  Genitourinary: Negative for flank pain.  Musculoskeletal: Negative for back pain and neck pain.  Skin: Negative for rash.  Neurological: Negative for syncope and headaches.  Hematological: Does not bruise/bleed easily.   Psychiatric/Behavioral: Negative for confusion.    Physical Exam Updated Vital Signs BP 93/65 (BP Location: Left Arm)   Pulse (!) 184   Temp 98.4 F (36.9 C) (Oral)   Resp (!) 22   Ht 1.829 m (6')   Wt 115.7 kg   SpO2 100%   BMI 34.58 kg/m   Physical Exam Vitals and nursing note reviewed.  Constitutional:      Appearance: He is well-developed.     Comments: Markedly tachycardic.   HENT:     Head: Atraumatic.     Nose: Nose normal.     Mouth/Throat:     Mouth: Mucous membranes are moist.  Eyes:     General: No scleral icterus.    Conjunctiva/sclera: Conjunctivae normal.  Neck:  Trachea: No tracheal deviation.  Cardiovascular:     Rate and Rhythm: Regular rhythm. Tachycardia present.     Pulses: Normal pulses.     Heart sounds: Normal heart sounds. No murmur heard. No friction rub. No gallop.   Pulmonary:     Effort: Pulmonary effort is normal. No accessory muscle usage or respiratory distress.     Breath sounds: Normal breath sounds.  Abdominal:     General: Bowel sounds are normal. There is no distension.     Palpations: Abdomen is soft.     Tenderness: There is no abdominal tenderness.  Genitourinary:    Comments: No cva tenderness. Musculoskeletal:        General: No swelling or tenderness.     Cervical back: Normal range of motion and neck supple. No rigidity.  Skin:    General: Skin is warm and dry.     Findings: No rash.  Neurological:     Mental Status: He is alert.     Comments: Alert, speech clear.   Psychiatric:        Mood and Affect: Mood normal.     ED Results / Procedures / Treatments   Labs (all labs ordered are listed, but only abnormal results are displayed) Results for orders placed or performed during the hospital encounter of 67/34/19  Basic metabolic panel  Result Value Ref Range   Sodium 136 135 - 145 mmol/L   Potassium 3.9 3.5 - 5.1 mmol/L   Chloride 96 (L) 98 - 111 mmol/L   CO2 25 22 - 32 mmol/L   Glucose, Bld 109 (H) 70  - 99 mg/dL   BUN 46 (H) 6 - 20 mg/dL   Creatinine, Ser 11.63 (H) 0.61 - 1.24 mg/dL   Calcium 8.7 (L) 8.9 - 10.3 mg/dL   GFR, Estimated 5 (L) >60 mL/min   Anion gap 15 5 - 15  CBC  Result Value Ref Range   WBC 9.1 4.0 - 10.5 K/uL   RBC 5.29 4.22 - 5.81 MIL/uL   Hemoglobin 16.2 13.0 - 17.0 g/dL   HCT 52.2 (H) 39.0 - 52.0 %   MCV 98.7 80.0 - 100.0 fL   MCH 30.6 26.0 - 34.0 pg   MCHC 31.0 30.0 - 36.0 g/dL   RDW 14.7 11.5 - 15.5 %   Platelets 202 150 - 400 K/uL   nRBC 0.0 0.0 - 0.2 %   LONG TERM MONITOR (3-14 DAYS)  Result Date: 08/06/2020  14 day event monitor  Rare supraventricular ectopy. Rare episodes of SVT up to 14 beats.  Rare ventricular ectopy. One episode of NSVT lasting 6 beats.  Symptoms reported but without sufficent data to correlate with heart rhythm at the time.  Patch Wear Time:  14 days and 0 hours (2022-02-22T18:05:23-0500 to 2022-03-08T18:05:27-0500) Patient had a min HR of 64 bpm, max HR of 207 bpm, and avg HR of 92 bpm. Predominant underlying rhythm was Sinus Rhythm. 1 run of Ventricular Tachycardia occurred lasting 6 beats with a max rate of 160 bpm (avg 148 bpm). 7 Supraventricular Tachycardia runs occurred, the run with the fastest interval lasting 14 beats with a max rate of 207 bpm (avg 165 bpm); the run with the fastest interval was also the longest. Isolated SVEs were rare (<1.0%), SVE Couplets were rare (<1.0%), and SVE Triplets were rare (<1.0%). Isolated VEs were rare (<1.0%), VE Couplets were rare (<1.0%), and no VE Triplets were present. Ventricular Trigeminy was present. Inverted QRS complexes possibly due to inverted  placement of device.    EKG EKG Interpretation  Date/Time:  Wednesday September 03 2020 08:29:14 EDT Ventricular Rate:  184 PR Interval:    QRS Duration: 79 QT Interval:  285 QTC Calculation: 499 R Axis:   18 Text Interpretation: Supraventricular tachycardia Repolarization abnormality, prob rate related Confirmed by Lajean Saver [phone])  on 09/03/2020 10:12:46 AM   Radiology No results found.  Procedures Procedures   Medications Ordered in ED Medications  adenosine (ADENOCARD) 6 MG/2ML injection 6 mg (has no administration in time range)    ED Course  I have reviewed the triage vital signs and the nursing notes.  Pertinent labs & imaging results that were available during my care of the patient were reviewed by me and considered in my medical decision making (see chart for details).    MDM Rules/Calculators/A&P                          Iv ns. Continuous pulse ox and cardiac monitoring. Stat labs. Ecg.   Reviewed nursing notes and prior charts for additional history.   No change in rhythm with vagal maneuvers - remains in SVT.  Adenosine rapid ivp.   Labs reviewed/interpreted by me - chem normal, k normal.   With adenosine, conversion to NSR.   Recheck pt, nsr, no dysrhythmia, no chest pain or discomfort, no sob. No faintness. Pt states feels at baseline.   Pt currently appears stable for d/c.   CRITICAL CARE RE: SVT with HR 180/severe tachycardia, emergent chem cardioversion with adenosine.  Performed by: Mirna Mires Total critical care time: 35 minutes Critical care time was exclusive of separately billable procedures and treating other patients. Critical care was necessary to treat or prevent imminent or life-threatening deterioration. Critical care was time spent personally by me on the following activities: development of treatment plan with patient and/or surrogate as well as nursing, discussions with consultants, evaluation of patient's response to treatment, examination of patient, obtaining history from patient or surrogate, ordering and performing treatments and interventions, ordering and review of laboratory studies, ordering and review of radiographic studies, pulse oximetry and re-evaluation of patient's condition.    Final Clinical Impression(s) / ED Diagnoses Final diagnoses:  None     Rx / DC Orders ED Discharge Orders    None       Lajean Saver, MD 09/03/20 1042

## 2020-09-03 NOTE — ED Triage Notes (Signed)
Pt arrived by [facility]. Pt was at dialysis for an hour and half. Nurse noticed a fast heart rate. Pt heart rate here in triage is 185. Pt is asymptomatic.

## 2020-09-04 NOTE — Progress Notes (Signed)
Cardiology Office Note  Date: 09/05/2020   ID: Brian Barrera, DOB 1970/06/06, MRN [id number]  PCP:  Curlene Labrum, MD  Cardiologist:  Carlyle Dolly, MD Electrophysiologist:  None   Chief Complaint: ER follow up SVT  History of Present Illness: Brian Barrera is a 50 y.o. male with a history of Chronic diastolic HF, SVT, anemia, essential hypertension, COPD, ESRD, tobacco abuse, obesity.  Last encounter with Dr. Harl Bowie 04/12/2019.  Patient was seen for evaluation for renal transplant.  No chest pain, shortness of breath, DOE.  As part of work-up at [facility] patient had an echo with normal EF, no significant pathology.  Stress echo was attempted but unable to reach target heart rate.  Nuclear imaging stress test was ordered.  Patient presented to [facility], ED 07/02/2020 with arrhythmia.  Had completed one half of dialysis when heart rate was noted to be in the 180s.  He had palpitations in his chest.  He denied any history of SVT or atrial fibrillation.  Reported mild chest discomfort.  He was cardioverted chemically with IV adenosine.  He was asymptomatic following cardioversion with heart rate less than 100.  He was advised to follow-up with cardiology  He is here for follow-up status post episode of SVT with presentation to ED resolved with 12 mg dose of adenosine.  He states he had  never experienced any episodes of arrhythmias in the past.  Stated he has had no recurrent episodes since the visit to the emergency room.  He denies any other symptoms such as anginal, dyspnea or shortness of breath, orthostatic symptoms, CVA or TIA-like symptoms, PND, orthopnea.  No bleeding, claudication-like symptoms, DVT or PE-like symptoms, or lower extremity edema.  Had an ED visit to [facility] emergency room on 08/06/2020 with complaints of palpitations chest discomfort starting during dialysis.  He was halfway through dialysis before he noted to be severely tachycardic at 160-170.[name]  He received  IV adenosine once again as he did at previous visit on 07/02/2020 with conversion back to normal sinus rhythm.    At last visit he stated he was frustrated due to the recent 2 episodes of rapid heart rate in the middle of his dialysis treatments.  He stated about 2 hours into the dialysis session he has been having the heart rate speed up.  He stated that dialysis sessions are pulling more than his usual dry weight of approximately 1 kg beyond his dry weight.  He stated he is wondering if this may have some relationship to his rapid heart rate.  We discussed the recent monitor results with no significant sustained arrhythmias.  Blood pressure is 104/78 .  He denies any anginal or exertional symptoms, palpitations or arrhythmias.  He was concerned the arrhythmia may happen again during dialysis.  Recent visit to [facility] emergency room on 09/03/2020  with complaint of tachycardia.  He was 1.5 hours into his dialysis session when he had onset of palpitations consistent with prior episodes of SVT.  Symptoms were acute in onset.  He took his Cardizem but symptoms persisted.  Denied any chest pain or discomfort or shortness of breath denied any syncope. Initial EKG in ED at [facility] September 03, 2020 showed SVT with a rate of 184 at 0829.  He received adenosine 6 mg IV.  Subsequent EKG in the emergency department at [place] showed sinus tachycardia with a rate of 106, right atrial enlargement, consider biatrial enlargement, borderline repolarization abnormality.  He is here  for follow-up after recent episode of recurrent SVT and visit to [facility] as noted above on 09/03/2020.  He is frustrated that he continues to have sudden rapid heart rates/SVT in the middle of dialysis.  He denies any other issues today.  Blood pressure 118/88.  Heart rate 77.  Denies any further rapid heartbeats, fluttering, palpitations.  Denies any anginal or exertional symptoms, orthostatic symptoms, CVA or TIA-like symptoms, PND, orthopnea,  bleeding, claudication, DVT or PE-like symptoms, lower extremity edema.   Past Medical History:  Diagnosis Date  . Anemia    Attributed to chronic disease/chronic kidney disease  . Chronic diastolic heart failure ([place]) 2011   EF 35% by echo in 2011; echo in 08/2011-normal EF, moderate to severe LVH; BNP level of [phone] in 2011-12  . Chronic obstructive pulmonary disease ([place])    with asthmatic component  . ESRD (end stage renal disease) on dialysis [facility])    secondary hyperparathyroidism  . Hypertension    h/o hypertensive crisis with encephalopathy  . Obesity   . Tobacco abuse    Approximate consumption of 25 pack years; continuing at 0.5 pack per day    Past Surgical History:  Procedure Laterality Date  . ARTERIOVENOUS GRAFT PLACEMENT  2011  . COLONOSCOPY N/A 02/04/2014   Procedure: COLONOSCOPY;  Surgeon: [name], MD;  Location: [facility];  Service: Endoscopy;  Laterality: N/A;  12:00  . COLONOSCOPY N/A 07/03/2019   Procedure: COLONOSCOPY;  Surgeon: [name], MD;  Location: [facility] ENDO SU[facility];  Service: Endoscopy;  Laterality: N/A;  12:00-office rescheduled 2/9 @ 11:00am  . POLYPECTOMY  07/03/2019   Procedure: POLYPECTOMY;  Surgeon: [name], MD;  Location: [facility] SU[facility];  Service: Endoscopy;;    Current Outpatient Medications  Medication Sig Dispense Refill  . albuterol (ACCUNEB) 1.25 MG/3ML nebulizer solution Take 1 ampule by nebulization as needed for wheezing or shortness of breath.    [name] albuterol (PROAIR HFA) 108 (90 BASE) MCG/ACT inhaler Inhale 2 puffs into the lungs every 4 (four) hours as needed for wheezing. 3 Inhaler 0  . aspirin EC 81 MG tablet Take 81 mg by mouth daily.    [name] atorvastatin (LIPITOR) 10 MG tablet Take 10 mg by mouth daily.    Lorin Picket 1 GM 210 MG(Fe) tablet Take 420 mg by mouth 3 (three) times daily with meals.     [name] b complex-vitamin c-folic acid (NEPHRO-VITE) 0.8 MG TABS Take 0.8 mg by mouth daily.     . budesonide-formoterol  (SYMBICORT) 80-4.5 MCG/ACT inhaler Inhale 2 puffs into the lungs 2 (two) times daily. 1 Inhaler 12  . cinacalcet (SENSIPAR) 60 MG tablet Take 60 mg by mouth daily.    . diphenhydramine-acetaminophen (TYLENOL PM) 25-500 MG TABS tablet Take 1 tablet by mouth at bedtime as needed (sleep).    . DULoxetine (CYMBALTA) 30 MG capsule Take 30 mg by mouth daily.    [name] gabapentin (NEURONTIN) 300 MG capsule Take 300 mg by mouth daily.    . Omega-3 1000 MG CAPS Take 2,000 mg by mouth daily.    [name] oxyCODONE-acetaminophen (PERCOCET) 7.5-325 MG tablet Take 1 tablet by mouth every 6 (six) hours as needed for moderate pain.    . sevelamer carbonate (RENVELA) 800 MG tablet Take 1,600-3,200 mg by mouth See admin instructions. Take 3200 mg with meals and 1600 with snack    . diltiazem (CARDIZEM) 30 MG tablet Take 2 tabs (60mg ) by mouth every morning only on dialysis days - may  take an extra 1 tab (30mg ) in the middle of dialysis as needed for rapid heart rate. 75 tablet 6   No current facility-administered medications for this visit.   Allergies:  Patient has no known allergies.   Social History: The patient  reports that he has been smoking cigarettes. He has a 12.50 pack-year smoking history. He has never used smokeless tobacco. He reports that he does not drink alcohol and does not use drugs.   Family History: The patient's family history includes Diabetes in his sister and sister; Heart disease in his father; Hypertension in his brother, brother, brother, mother, sister, sister, and sister; Kidney disease in his mother.   ROS:  Please see the history of present illness. Otherwise, complete review of systems is positive for none.  All other systems are reviewed and negative.   Physical Exam: VS:  BP 118/88   Pulse 77   Ht 6' (1.829 m)   Wt 254 lb 3.2 oz (115.3 kg)   SpO2 99%   BMI 34.48 kg/m , BMI Body mass index is 34.48 kg/m.  Wt Readings from Last 3 Encounters:  09/05/20 254 lb 3.2 oz (115.3 kg)   09/03/20 255 lb (115.7 kg)  08/14/20 257 lb (116.6 kg)    General: Patient appears comfortable at rest. Neck: Supple, no elevated JVP or carotid bruits, no thyromegaly. Lungs: Clear to auscultation, nonlabored breathing at rest. Cardiac: Regular rate and rhythm, no S3 or significant systolic murmur, no pericardial rub. Extremities: No pitting edema, distal pulses 2+. Skin: Warm and dry. Musculoskeletal: No kyphosis. Neuropsychiatric: Alert and oriented x3, affect grossly appropriate.  ECG:  EKG at [facility], ED 07/02/2020 showed sinus tachycardia rate of 116, right atrial enlargement, consider biatrial enlargement, low voltage, precordial leads.  Recent Labwork: 07/02/2020: Magnesium 2.9 09/03/2020: BUN 46; Creatinine, Ser 11.63; Hemoglobin 16.2; Platelets 202; Potassium 3.9; Sodium 136     Component Value Date/Time   CHOL 175 01/04/2013 1140   TRIG 172 (H) 01/04/2013 1140   HDL 45 01/04/2013 1140   CHOLHDL 3.9 01/04/2013 1140   VLDL 34 01/04/2013 1140   LDLCALC 96 01/04/2013 1140    Other Studies Reviewed Today:  Cardiac monitor 08/06/2020 Study Highlights    14 day event monitor  Rare supraventricular ectopy. Rare episodes of SVT up to 14 beats.  Rare ventricular ectopy. One episode of NSVT lasting 6 beats.  Symptoms reported but without sufficent data to correlate with heart rhythm at the time.    Patch Wear Time:  14 days and 0 hours (2022-02-22T18:05:23-0500 to 2022-03-08T18:05:27-0500)  Patient had a min HR of 64 bpm, max HR of 207 bpm, and avg HR of 92 bpm. Predominant underlying rhythm was Sinus Rhythm. 1 run of Ventricular Tachycardia occurred lasting 6 beats with a max rate of 160 bpm (avg 148 bpm). 7 Supraventricular Tachycardia  runs occurred, the run with the fastest interval lasting 14 beats with a max rate of 207 bpm (avg 165 bpm); the run with the fastest interval was also the longest. Isolated SVEs were rare (<1.0%), SVE Couplets were rare (<1.0%), and  SVE Triplets were  rare (<1.0%). Isolated VEs were rare (<1.0%), VE Couplets were rare (<1.0%), and no VE Triplets were present. Ventricular Trigeminy was present. Inverted QRS complexes possibly due to inverted placement of device.     10/2018 [facility] stress echo Protocol: Treadmill (Modified [place])       Drugs: Beta Blockers  Target Heart Rate: 145 Maximum Predicted Heart Rate: 171  Resting ECG: Sinus rhythm   Protocol Note: LABETOLOL 200mg  BID, LAST DOSE 11/17/2018 AM    TYPE   STAGE  TIME  HR  BP  RPE SPO2 COMMENTS  -------- ----- --------- --- ------- ----- ---- ------------------------------  Baseline         90 105/ 63  [place]  1   6    90 105/ 63  [place]  2   1    88 97/ 67      STANDING BP  Stress   1  180 sec. 105 130/ 64 7/20  Stress   2  180 sec. 113 130/ 74 11/20  Stress   3  121 sec. 118 140/ 72 15/20   LEG DISCOMFORT 7/10  Recovery  1   1 min. 110 112/ 65  Recovery  2   2 min. 103 115/ 55  Recovery  3   4 min. 88 131/ 61  Recovery  4   6 min. 90 122/ 65  Recovery  5   8 min. 87 120/ 66      LEG DISCOMFORT RESOLVED    Stress Duration: 8.02 minutes. Max Stress H.R: 118  Target Heart Rate (145) Achieved: No  Max. workload of 4.60 METs was achieved during exercise.    HR Response: Attenuated secondary to medication    BP Response: Normal resting BP - blunted response    WALL SEGMENT FINDINGS --------------------------------------------------------               Rest      Stress             --------------- ---------------  Anterior Septum Basal: Normal     Hyperkinetic          Mid: Normal     Hyperkinetic     Apical Septum: Normal     Hyperkinetic    Anterior Wall Basal: Normal     Hyperkinetic          Mid: Normal     Hyperkinetic          Apical: Normal     Hyperkinetic     Lateral Wall Basal: Normal     Hyperkinetic          Mid: Normal     Hyperkinetic         Apical: Normal     Hyperkinetic    Posterior Wall Basal: Normal     Hyperkinetic          Mid: Normal     Hyperkinetic    Inferior Wall Basal: Normal     Hyperkinetic          Mid: Normal     Hyperkinetic         Apical: Normal     Hyperkinetic    Inferior Septum Basal: Normal     Hyperkinetic          Mid: Normal     Hyperkinetic             EF: >55%      >55%      ADDITIONAL FINDINGS ----------------------------------------------------------    Chest Discomfort: None     Arrhythmia: None  Termination Reason: Leg fatigue    Complications: None    STRESS ECG RESULTS -----------------------------------------------------------    ECG Results: Nondiagnostic due to inadequate heart rate. .    INTERPRETATION ---------------------------------------------------------------    INDETERMINATE.  NO DOPPLER PERFORMED FOR VALVULAR REGURGITATION  NO DOPPLER PERFORMED FOR VALVULAR STENOSIS  Note:  NO WALL MOTION ABNORMALITIES AT REST OR SUBMAXIMAL STRESS (PEAK  STRESS <118 bpm, TARGET = 145 bpm)  Maximum workload of 4.60 METs was achieved during exercise.  NORMAL RESTING BP - BLUNTED RESPONSE   10/2018 echo INTERPRETATION ---------------------------------------------------------------  NORMAL LEFT VENTRICULAR SYSTOLIC FUNCTION WITH MODERATE LVH  NORMAL LA PRESSURES WITH NORMAL DIASTOLIC FUNCTION  NORMAL RIGHT VENTRICULAR SYSTOLIC FUNCTION  VALVULAR REGURGITATION: TRIVIAL MR, TRIVIAL TR  NO VALVULAR STENOSIS  NO PRIOR STUDY FOR COMPARISON   NST 04/23/2019 Study Result  Narrative & Impression   No diagnostic ST segment changes to indicate ischemia.  Small, mild intensity,  inferior defect that is partially reversible at the apex in the setting of increased radiotracer uptake on rest imaging near the inferior wall. Otherwise defect is fixed and suggestive of soft tissue attenuation. Cannot exclude a mild inferior apical ischemic territory, although with reduced specificity.  This is a low risk study.  Nuclear stress EF: 53%.        Assessment and Plan:   1. SVT (supraventricular tachycardia) (HCC)\ He is returning for follow-up after another episode of SVT during dialysis.  He was treated with IV adenosine for SVT which resolved.  Advised him to start taking Cardizem 60 mg 1 hour prior to dialysis on dialysis days.  If he notices his heart rate increasing in the middle of dialysis take a third 30 mg Cardizem.  If this does not work we may need to start Cardizem CD prior to dialysis possibly 120 mg during dialysis days.  See recent EKGs before and after adenosine done at [facility], ED 09/03/2020  2. Essential hypertension BP today 118/88.  Currently not on any antihypertensive medications.  3. Chronic diastolic heart failure [facility]) Last echocardiogram June 2020 showed normal LV function with moderate LVH.  Normal diastolic function.  Trivial MR, trivial TR.  Denies any shortness of breath, DOE, weight gain, lower extremity edema.  4. ESRD (end stage renal disease) on dialysis [facility]) Receives dialysis on Mondays, Wednesdays and Fridays.  He states he believes he is getting too much fluid taken off which may be contributing to his recent 2 episodes of SVT.  Had a recent third episode of SVT requiring ED visit with IV adenosine for treatment on 09/03/2020.  I advised him to start taking 60 mg of Cardizem p.o. 1 hour prior to dialysis on dialysis days.  I advised him to take his pills with him and take an extra 30 mg in the middle of dialysis if he notices his heart rate increasing.  5.  Hyperlipidemia He states his primary care provider recently started him on  Lipitor.  Continue atorvastatin 10 mg daily.  Continue aspirin 81 mg daily.  Medication Adjustments/Labs and Tests Ordered: Current medicines are reviewed at length with the patient today.  Concerns regarding medicines are outlined above.   Disposition: Follow-up with Dr. Harl Bowie or APP 1 month  Signed, Levell July, NP 09/05/2020 11:44 AM    [place] [facility] [address], [place], [place] [address] Phone: [phone]; Fax: [phone]

## 2020-09-05 ENCOUNTER — Encounter: Payer: Self-pay | Admitting: Family Medicine

## 2020-09-05 ENCOUNTER — Ambulatory Visit (INDEPENDENT_AMBULATORY_CARE_PROVIDER_SITE_OTHER): Payer: Medicare Other | Admitting: Family Medicine

## 2020-09-05 ENCOUNTER — Other Ambulatory Visit: Payer: Self-pay

## 2020-09-05 VITALS — BP 118/88 | HR 77 | Ht 72.0 in | Wt 254.2 lb

## 2020-09-05 DIAGNOSIS — I471 Supraventricular tachycardia: Secondary | ICD-10-CM

## 2020-09-05 DIAGNOSIS — E785 Hyperlipidemia, unspecified: Secondary | ICD-10-CM

## 2020-09-05 DIAGNOSIS — Z992 Dependence on renal dialysis: Secondary | ICD-10-CM | POA: Diagnosis not present

## 2020-09-05 DIAGNOSIS — I5032 Chronic diastolic (congestive) heart failure: Secondary | ICD-10-CM | POA: Diagnosis not present

## 2020-09-05 DIAGNOSIS — I10 Essential (primary) hypertension: Secondary | ICD-10-CM

## 2020-09-05 DIAGNOSIS — N186 End stage renal disease: Secondary | ICD-10-CM | POA: Diagnosis not present

## 2020-09-05 MED ORDER — DILTIAZEM HCL 30 MG PO TABS: ORAL_TABLET | ORAL | 6 refills | Status: DC

## 2020-09-05 NOTE — Patient Instructions (Addendum)
Medication Instructions:   Increase Cardizem to 60mg  every morning on dialysis days only - may take 30mg  as needed during dialysis for elevated heart rate.   Continue all other medications.    Labwork: none  Testing/Procedures: none  Follow-Up: 1 month   Any Other Special Instructions Will Be Listed Below (If Applicable).  If you need a refill on your cardiac medications before your next appointment, please call your pharmacy.

## 2020-09-08 DIAGNOSIS — Z992 Dependence on renal dialysis: Secondary | ICD-10-CM | POA: Diagnosis not present

## 2020-09-08 DIAGNOSIS — N186 End stage renal disease: Secondary | ICD-10-CM | POA: Diagnosis not present

## 2020-09-10 DIAGNOSIS — Z992 Dependence on renal dialysis: Secondary | ICD-10-CM | POA: Diagnosis not present

## 2020-09-10 DIAGNOSIS — N186 End stage renal disease: Secondary | ICD-10-CM | POA: Diagnosis not present

## 2020-09-12 DIAGNOSIS — N186 End stage renal disease: Secondary | ICD-10-CM | POA: Diagnosis not present

## 2020-09-12 DIAGNOSIS — Z992 Dependence on renal dialysis: Secondary | ICD-10-CM | POA: Diagnosis not present

## 2020-09-15 DIAGNOSIS — Z992 Dependence on renal dialysis: Secondary | ICD-10-CM | POA: Diagnosis not present

## 2020-09-15 DIAGNOSIS — N186 End stage renal disease: Secondary | ICD-10-CM | POA: Diagnosis not present

## 2020-09-15 DIAGNOSIS — N2581 Secondary hyperparathyroidism of renal origin: Secondary | ICD-10-CM | POA: Diagnosis not present

## 2020-09-16 DIAGNOSIS — Z79891 Long term (current) use of opiate analgesic: Secondary | ICD-10-CM | POA: Diagnosis not present

## 2020-09-16 DIAGNOSIS — M25569 Pain in unspecified knee: Secondary | ICD-10-CM | POA: Diagnosis not present

## 2020-09-16 DIAGNOSIS — M545 Low back pain, unspecified: Secondary | ICD-10-CM | POA: Diagnosis not present

## 2020-09-16 DIAGNOSIS — N08 Glomerular disorders in diseases classified elsewhere: Secondary | ICD-10-CM | POA: Diagnosis not present

## 2020-09-16 DIAGNOSIS — M25562 Pain in left knee: Secondary | ICD-10-CM | POA: Diagnosis not present

## 2020-09-16 DIAGNOSIS — Z8679 Personal history of other diseases of the circulatory system: Secondary | ICD-10-CM | POA: Diagnosis not present

## 2020-09-16 DIAGNOSIS — M25512 Pain in left shoulder: Secondary | ICD-10-CM | POA: Diagnosis not present

## 2020-09-17 DIAGNOSIS — Z992 Dependence on renal dialysis: Secondary | ICD-10-CM | POA: Diagnosis not present

## 2020-09-17 DIAGNOSIS — N186 End stage renal disease: Secondary | ICD-10-CM | POA: Diagnosis not present

## 2020-09-19 DIAGNOSIS — Z992 Dependence on renal dialysis: Secondary | ICD-10-CM | POA: Diagnosis not present

## 2020-09-19 DIAGNOSIS — N186 End stage renal disease: Secondary | ICD-10-CM | POA: Diagnosis not present

## 2020-09-20 DIAGNOSIS — Z992 Dependence on renal dialysis: Secondary | ICD-10-CM | POA: Diagnosis not present

## 2020-09-20 DIAGNOSIS — N186 End stage renal disease: Secondary | ICD-10-CM | POA: Diagnosis not present

## 2020-09-21 DIAGNOSIS — Z992 Dependence on renal dialysis: Secondary | ICD-10-CM | POA: Diagnosis not present

## 2020-09-21 DIAGNOSIS — N186 End stage renal disease: Secondary | ICD-10-CM | POA: Diagnosis not present

## 2020-09-22 DIAGNOSIS — N186 End stage renal disease: Secondary | ICD-10-CM | POA: Diagnosis not present

## 2020-09-22 DIAGNOSIS — N2581 Secondary hyperparathyroidism of renal origin: Secondary | ICD-10-CM | POA: Diagnosis not present

## 2020-09-22 DIAGNOSIS — Z992 Dependence on renal dialysis: Secondary | ICD-10-CM | POA: Diagnosis not present

## 2020-09-24 DIAGNOSIS — N186 End stage renal disease: Secondary | ICD-10-CM | POA: Diagnosis not present

## 2020-09-24 DIAGNOSIS — Z992 Dependence on renal dialysis: Secondary | ICD-10-CM | POA: Diagnosis not present

## 2020-09-26 DIAGNOSIS — Z992 Dependence on renal dialysis: Secondary | ICD-10-CM | POA: Diagnosis not present

## 2020-09-26 DIAGNOSIS — N186 End stage renal disease: Secondary | ICD-10-CM | POA: Diagnosis not present

## 2020-09-29 DIAGNOSIS — D509 Iron deficiency anemia, unspecified: Secondary | ICD-10-CM | POA: Diagnosis not present

## 2020-09-29 DIAGNOSIS — N2581 Secondary hyperparathyroidism of renal origin: Secondary | ICD-10-CM | POA: Diagnosis not present

## 2020-09-29 DIAGNOSIS — Z992 Dependence on renal dialysis: Secondary | ICD-10-CM | POA: Diagnosis not present

## 2020-09-29 DIAGNOSIS — N186 End stage renal disease: Secondary | ICD-10-CM | POA: Diagnosis not present

## 2020-10-01 DIAGNOSIS — N186 End stage renal disease: Secondary | ICD-10-CM | POA: Diagnosis not present

## 2020-10-01 DIAGNOSIS — Z992 Dependence on renal dialysis: Secondary | ICD-10-CM | POA: Diagnosis not present

## 2020-10-03 DIAGNOSIS — N186 End stage renal disease: Secondary | ICD-10-CM | POA: Diagnosis not present

## 2020-10-03 DIAGNOSIS — Z992 Dependence on renal dialysis: Secondary | ICD-10-CM | POA: Diagnosis not present

## 2020-10-06 DIAGNOSIS — Z992 Dependence on renal dialysis: Secondary | ICD-10-CM | POA: Diagnosis not present

## 2020-10-06 DIAGNOSIS — N186 End stage renal disease: Secondary | ICD-10-CM | POA: Diagnosis not present

## 2020-10-08 DIAGNOSIS — N186 End stage renal disease: Secondary | ICD-10-CM | POA: Diagnosis not present

## 2020-10-08 DIAGNOSIS — Z992 Dependence on renal dialysis: Secondary | ICD-10-CM | POA: Diagnosis not present

## 2020-10-10 DIAGNOSIS — Z992 Dependence on renal dialysis: Secondary | ICD-10-CM | POA: Diagnosis not present

## 2020-10-10 DIAGNOSIS — N186 End stage renal disease: Secondary | ICD-10-CM | POA: Diagnosis not present

## 2020-10-12 DIAGNOSIS — N186 End stage renal disease: Secondary | ICD-10-CM | POA: Diagnosis not present

## 2020-10-12 DIAGNOSIS — Z992 Dependence on renal dialysis: Secondary | ICD-10-CM | POA: Diagnosis not present

## 2020-10-13 DIAGNOSIS — Z992 Dependence on renal dialysis: Secondary | ICD-10-CM | POA: Diagnosis not present

## 2020-10-13 DIAGNOSIS — N186 End stage renal disease: Secondary | ICD-10-CM | POA: Diagnosis not present

## 2020-10-13 DIAGNOSIS — N2581 Secondary hyperparathyroidism of renal origin: Secondary | ICD-10-CM | POA: Diagnosis not present

## 2020-10-15 DIAGNOSIS — Z992 Dependence on renal dialysis: Secondary | ICD-10-CM | POA: Diagnosis not present

## 2020-10-15 DIAGNOSIS — N186 End stage renal disease: Secondary | ICD-10-CM | POA: Diagnosis not present

## 2020-10-15 NOTE — Progress Notes (Signed)
Cardiology Office Note  Date: 10/16/2020   ID: CORRIGAN KRETSCHMER, DOB 03-Nov-1970, MRN [id number]  PCP:  Curlene Labrum, MD  Cardiologist:  Carlyle Dolly, MD Electrophysiologist:  None   Chief Complaint: follow up  History of Present Illness: Brian Barrera is a 50 y.o. male with a history of Chronic diastolic HF, SVT, anemia, essential hypertension, COPD, ESRD, tobacco abuse, obesity.  Last encounter with Dr. Harl Bowie 04/12/2019.  Patient was seen for evaluation for renal transplant.  No chest pain, shortness of breath, DOE.  As part of work-up at [facility] patient had an echo with normal EF, no significant pathology.  Stress echo was attempted but unable to reach target heart rate.  Nuclear imaging stress test was ordered.  Patient presented to [facility], ED 07/02/2020 with arrhythmia.  Had completed one half of dialysis when heart rate was noted to be in the 180s.  He had palpitations in his chest.  He denied any history of SVT or atrial fibrillation.  Reported mild chest discomfort.  He was cardioverted chemically with IV adenosine.  He was asymptomatic following cardioversion with heart rate less than 100.  He was advised to follow-up with cardiology  Had an ED visit to [facility] emergency room on 08/06/2020 with complaints of palpitations chest discomfort starting during dialysis.  He was halfway through dialysis before he noted to be severely tachycardic at 160-170.[name]  He received IV adenosine once again as he did at previous visit on 07/02/2020 with conversion back to normal sinus rhythm.    At prior visit he stated he was frustrated due to the recent 2 episodes of rapid heart rate in the middle of his dialysis treatments.  He stated about 2 hours into the dialysis session he has been having the heart rate speed up.  He stated that dialysis sessions are pulling more than his usual dry weight of approximately 1 kg beyond his dry weight.  He stated he is wondering if this may have some  relationship to his rapid heart rate.  We discussed the recent monitor results with no significant sustained arrhythmias.  Blood pressure is 104/78 .  He denies any anginal or exertional symptoms, palpitations or arrhythmias.  He was concerned the arrhythmia may happen again during dialysis.  Recent visit to [facility] emergency room on 09/03/2020  with complaint of tachycardia.  He was 1.5 hours into his dialysis session when he had onset of palpitations consistent with prior episodes of SVT.  Symptoms were acute in onset.  He took his Cardizem but symptoms persisted.  Denied any chest pain or discomfort or shortness of breath denied any syncope. Initial EKG in ED at [facility] September 03, 2020 showed SVT with a rate of 184 at 0829.  He received adenosine 6 mg IV.  Subsequent EKG in the emergency department at [place] showed sinus tachycardia with a rate of 106, right atrial enlargement, consider biatrial enlargement, borderline repolarization abnormality.   He is here for follow-up after medication changes at last visit to prevent him from going into SVT during dialysis.  He states the combination of 60 mg prior to dialysis and 30 mg during dialysis seems to be working.  He has not had  further episodes during dialysis.  He states he did have an episode later in the evening 1 day and took 30 mg of Cardizem which resolved the issue.  Otherwise he denies any anginal or exertional symptoms orthostatic symptoms, CVA or TIA like symptoms, PND,  orthopnea, palpitations or arrhythmias, bleeding, claudication-like symptoms, DVT or PE-like symptoms, or lower extremity edema.   Past Medical History:  Diagnosis Date  . Anemia    Attributed to chronic disease/chronic kidney disease  . Chronic diastolic heart failure ([employer]) 2011   EF 35% by echo in 2011; echo in 08/2011-normal EF, moderate to severe LVH; BNP level of [phone] in 2011-12  . Chronic obstructive pulmonary disease ([place])    with asthmatic component  . ESRD  (end stage renal disease) on dialysis [facility])    secondary hyperparathyroidism  . Hypertension    h/o hypertensive crisis with encephalopathy  . Obesity   . Tobacco abuse    Approximate consumption of 25 pack years; continuing at 0.5 pack per day    Past Surgical History:  Procedure Laterality Date  . ARTERIOVENOUS GRAFT PLACEMENT  2011  . COLONOSCOPY N/A 02/04/2014   Procedure: COLONOSCOPY;  Surgeon: [name], MD;  Location[facility];  Service: Endoscopy;  Laterality: N/A;  12:00  . COLONOSCOPY N/A 07/03/2019   Procedure: COLONOSCOPY;  Surgeon: [name], MD;  Location: [facility];  Service: Endoscopy;  Laterality: N/A;  12:00-office rescheduled 2/9 @ 11:00am  . POLYPECTOMY  07/03/2019   Procedure: POLYPECTOMY;  Surgeon: [name], MD;  Location: [facility];  Service: Endoscopy;;    Current Outpatient Medications  Medication Sig Dispense Refill  . albuterol (ACCUNEB) 1.25 MG/3ML nebulizer solution Take 1 ampule by nebulization as needed for wheezing or shortness of breath.    [name] albuterol (PROAIR HFA) 108 (90 BASE) MCG/ACT inhaler Inhale 2 puffs into the lungs every 4 (four) hours as needed for wheezing. 3 Inhaler 0  . aspirin EC 81 MG tablet Take 81 mg by mouth daily.    [name] atorvastatin (LIPITOR) 10 MG tablet Take 10 mg by mouth daily.    Lorin Picket 1 GM 210 MG(Fe) tablet Take 420 mg by mouth 3 (three) times daily with meals.     [name] b complex-vitamin c-folic acid (NEPHRO-VITE) 0.8 MG TABS Take 0.8 mg by mouth daily.     . budesonide-formoterol (SYMBICORT) 80-4.5 MCG/ACT inhaler Inhale 2 puffs into the lungs 2 (two) times daily. 1 Inhaler 12  . cinacalcet (SENSIPAR) 60 MG tablet Take 60 mg by mouth daily.    [name] diltiazem (CARDIZEM) 30 MG tablet Take 2 tabs (60mg ) by mouth every morning only on dialysis days - may take an extra 1 tab (30mg ) in the middle of dialysis as needed for rapid heart rate. 75 tablet 6  . diphenhydramine-acetaminophen (TYLENOL PM) 25-500 MG TABS  tablet Take 1 tablet by mouth at bedtime as needed (sleep).    . DULoxetine (CYMBALTA) 30 MG capsule Take 30 mg by mouth daily.    [name] gabapentin (NEURONTIN) 300 MG capsule Take 300 mg by mouth daily.    . Omega-3 1000 MG CAPS Take 2,000 mg by mouth daily.    [name] oxyCODONE-acetaminophen (PERCOCET) 7.5-325 MG tablet Take 1 tablet by mouth every 6 (six) hours as needed for moderate pain.    . sevelamer carbonate (RENVELA) 800 MG tablet Take 1,600-3,200 mg by mouth See admin instructions. Take 3200 mg with meals and 1600 with snack     No current facility-administered medications for this visit.   Allergies:  Patient has no known allergies.   Social History: The patient  reports that he has been smoking cigarettes. He started smoking about 35 years ago. He has a 12.50 pack-year smoking history. He has  never used smokeless tobacco. He reports that he does not drink alcohol and does not use drugs.   Family History: The patient's family history includes Diabetes in his sister and sister; Heart disease in his father; Hypertension in his brother, brother, brother, mother, sister, sister, and sister; Kidney disease in his mother.   ROS:  Please see the history of present illness. Otherwise, complete review of systems is positive for none.  All other systems are reviewed and negative.   Physical Exam: VS:  BP (!) 130/94   Pulse 88   Ht 6' (1.829 m)   Wt 256 lb 3.2 oz (116.2 kg)   SpO2 99%   BMI 34.75 kg/m , BMI Body mass index is 34.75 kg/m.  Wt Readings from Last 3 Encounters:  10/16/20 256 lb 3.2 oz (116.2 kg)  09/05/20 254 lb 3.2 oz (115.3 kg)  09/03/20 255 lb (115.7 kg)    General: Patient appears comfortable at rest. Neck: Supple, no elevated JVP or carotid bruits, no thyromegaly. Lungs: Clear to auscultation, nonlabored breathing at rest. Cardiac: Regular rate and rhythm, no S3 or significant systolic murmur, no pericardial rub. Extremities: No pitting edema, distal pulses 2+. Skin: Warm  and dry. Musculoskeletal: No kyphosis. Neuropsychiatric: Alert and oriented x3, affect grossly appropriate.  ECG:  EKG at [facility], ED 07/02/2020 showed sinus tachycardia rate of 116, right atrial enlargement, consider biatrial enlargement, low voltage, precordial leads.  Recent Labwork: 07/02/2020: Magnesium 2.9 09/03/2020: BUN 46; Creatinine, Ser 11.63; Hemoglobin 16.2; Platelets 202; Potassium 3.9; Sodium 136     Component Value Date/Time   CHOL 175 01/04/2013 1140   TRIG 172 (H) 01/04/2013 1140   HDL 45 01/04/2013 1140   CHOLHDL 3.9 01/04/2013 1140   VLDL 34 01/04/2013 1140   LDLCALC 96 01/04/2013 1140    Other Studies Reviewed Today:  Cardiac monitor 08/06/2020 Study Highlights    14 day event monitor  Rare supraventricular ectopy. Rare episodes of SVT up to 14 beats.  Rare ventricular ectopy. One episode of NSVT lasting 6 beats.  Symptoms reported but without sufficent data to correlate with heart rhythm at the time.    Patch Wear Time:  14 days and 0 hours (2022-02-22T18:05:23-0500 to 2022-03-08T18:05:27-0500)  Patient had a min HR of 64 bpm, max HR of 207 bpm, and avg HR of 92 bpm. Predominant underlying rhythm was Sinus Rhythm. 1 run of Ventricular Tachycardia occurred lasting 6 beats with a max rate of 160 bpm (avg 148 bpm). 7 Supraventricular Tachycardia  runs occurred, the run with the fastest interval lasting 14 beats with a max rate of 207 bpm (avg 165 bpm); the run with the fastest interval was also the longest. Isolated SVEs were rare (<1.0%), SVE Couplets were rare (<1.0%), and SVE Triplets were  rare (<1.0%). Isolated VEs were rare (<1.0%), VE Couplets were rare (<1.0%), and no VE Triplets were present. Ventricular Trigeminy was present. Inverted QRS complexes possibly due to inverted placement of device.     10/2018 [facility] stress echo Protocol: Treadmill (Modified Bruce)       Drugs: Beta Blockers  Target Heart Rate: 145 Maximum Predicted Heart  Rate: 171    Resting ECG: Sinus rhythm   Protocol Note: LABETOLOL 200mg  BID, LAST DOSE 11/17/2018 AM    TYPE   STAGE  TIME  HR  BP  RPE SPO2 COMMENTS  -------- ----- --------- --- ------- ----- ---- ------------------------------  Baseline         90 105/ 63  [place]  1   6    90 105/ 63  Base[place]  2   1    88 97/ 67      STANDING BP  Stress   1  180 sec. 105 130/ 64 7/20  Stress   2  180 sec. 113 130/ 74 11/20  Stress   3  121 sec. 118 140/ 72 15/20   LEG DISCOMFORT 7/10  Recovery  1   1 min. 110 112/ 65  Recovery  2   2 min. 103 115/ 55  Recovery  3   4 min. 88 131/ 61  Recovery  4   6 min. 90 122/ 65  Recovery  5   8 min. 87 120/ 66      LEG DISCOMFORT RESOLVED    Stress Duration: 8.02 minutes. Max Stress H.R: 118  Target Heart Rate (145) Achieved: No  Max. workload of 4.60 METs was achieved during exercise.    HR Response: Attenuated secondary to medication    BP Response: Normal resting BP - blunted response    WALL SEGMENT FINDINGS --------------------------------------------------------               Rest      Stress             --------------- ---------------  Anterior Septum Basal: Normal     Hyperkinetic          Mid: Normal     Hyperkinetic     Apical Septum: Normal     Hyperkinetic    Anterior Wall Basal: Normal     Hyperkinetic          Mid: Normal     Hyperkinetic         Apical: Normal     Hyperkinetic     Lateral Wall Basal: Normal     Hyperkinetic          Mid: Normal     Hyperkinetic         Apical: Normal     Hyperkinetic    Posterior Wall Basal: Normal     Hyperkinetic          Mid: Normal     Hyperkinetic    Inferior Wall Basal: Normal     Hyperkinetic           Mid: Normal     Hyperkinetic         Apical: Normal     Hyperkinetic    Inferior Septum Basal: Normal     Hyperkinetic          Mid: Normal     Hyperkinetic             EF: >55%      >55%      ADDITIONAL FINDINGS ----------------------------------------------------------    Chest Discomfort: None     Arrhythmia: None  Termination Reason: Leg fatigue    Complications: None    STRESS ECG RESULTS -----------------------------------------------------------    ECG Results: Nondiagnostic due to inadequate heart rate. .    INTERPRETATION ---------------------------------------------------------------    INDETERMINATE.  NO DOPPLER PERFORMED FOR VALVULAR REGURGITATION  NO DOPPLER PERFORMED FOR VALVULAR STENOSIS  Note: NO WALL MOTION ABNORMALITIES AT REST OR SUBMAXIMAL STRESS (PEAK  STRESS <118 bpm, TARGET = 145 bpm)  Maximum workload of 4.60 METs was achieved during exercise.  NORMAL RESTING BP - BLUNTED RESPONSE   10/2018 echo INTERPRETATION ---------------------------------------------------------------  NORMAL LEFT VENTRICULAR SYSTOLIC FUNCTION WITH MODERATE LVH  NORMAL LA PRESSURES WITH NORMAL DIASTOLIC  FUNCTION  NORMAL RIGHT VENTRICULAR SYSTOLIC FUNCTION  VALVULAR REGURGITATION: TRIVIAL MR, TRIVIAL TR  NO VALVULAR STENOSIS  NO PRIOR STUDY FOR COMPARISON   NST 04/23/2019 Study Result  Narrative & Impression   No diagnostic ST segment changes to indicate ischemia.  Small, mild intensity, inferior defect that is partially reversible at the apex in the setting of increased radiotracer uptake on rest imaging near the inferior wall. Otherwise defect is fixed and suggestive of soft tissue attenuation. Cannot exclude a mild inferior apical ischemic territory, although with reduced specificity.  This is a low risk study.  Nuclear stress EF: 53%.        Assessment and  Plan:   1. SVT (supraventricular tachycardia) ([place]) He states he has had no further episodes since medication changes at last visit.  At last visit he was instructed to take Cardizem 60 mg 1 hour prior to dialysis and 30 mg as needed if palpitations began during dialysis.  He states he did have 1 episode palpitations in the evening 1 day and took 30 mg of Cardizem which resolved the issue.  Continue medication as directed.   2. Essential hypertension BP today 130/94.  Continue Cardizem 60 mg on dialysis days.  Continue 30 mg of Cardizem as needed  3. Chronic diastolic heart failure [facility]) Last echocardiogram June 2020 showed normal LV function with moderate LVH.  Normal diastolic function.  Trivial MR, trivial TR.  Denies any shortness of breath, DOE, weight gain, lower extremity edema.  4. ESRD (end stage renal disease) on dialysis [facility]) Receives dialysis on Mondays, Wednesdays and Fridays.  He states he believes he is getting too much fluid taken off which may be contributing to his recent 2 episodes of SVT.  Had a recent third episode of SVT requiring ED visit with IV adenosine for treatment on 09/03/2020.  I advised him to start taking 60 mg of Cardizem p.o. 1 hour prior to dialysis on dialysis days.  I advised him to take his pills with him and take an extra 30 mg in the middle of dialysis if he notices his heart rate increasing.  He returns today and states he has had no further episodes since medication changes.  Advised to continue above orders with 60 mg Cardizem 1 hour prior to dialysis and may take an extra 30 mg in the middle of dialysis if he has increased heart rate/palpitations  5.  Hyperlipidemia He states his primary care provider recently started him on Lipitor.  Continue atorvastatin 10 mg daily.  Continue aspirin 81 mg daily.  Medication Adjustments/Labs and Tests Ordered: Current medicines are reviewed at length with the patient today.  Concerns regarding medicines are outlined  above.   Disposition: Follow-up with Dr. Harl Bowie or APP 6 months  Signed, Levell July, NP 10/16/2020 8:18 AM    [facility] [place], [place], [place] [address] Phone: [phone]; Fax: [phone]

## 2020-10-16 ENCOUNTER — Ambulatory Visit (INDEPENDENT_AMBULATORY_CARE_PROVIDER_SITE_OTHER): Payer: Medicare Other | Admitting: Family Medicine

## 2020-10-16 ENCOUNTER — Encounter: Payer: Self-pay | Admitting: Family Medicine

## 2020-10-16 VITALS — BP 130/94 | HR 88 | Ht 72.0 in | Wt 256.2 lb

## 2020-10-16 DIAGNOSIS — I471 Supraventricular tachycardia: Secondary | ICD-10-CM

## 2020-10-16 DIAGNOSIS — N186 End stage renal disease: Secondary | ICD-10-CM | POA: Diagnosis not present

## 2020-10-16 DIAGNOSIS — I5032 Chronic diastolic (congestive) heart failure: Secondary | ICD-10-CM | POA: Diagnosis not present

## 2020-10-16 DIAGNOSIS — I10 Essential (primary) hypertension: Secondary | ICD-10-CM

## 2020-10-16 DIAGNOSIS — Z992 Dependence on renal dialysis: Secondary | ICD-10-CM

## 2020-10-16 DIAGNOSIS — E785 Hyperlipidemia, unspecified: Secondary | ICD-10-CM | POA: Diagnosis not present

## 2020-10-16 NOTE — Patient Instructions (Signed)

## 2020-10-17 DIAGNOSIS — Z992 Dependence on renal dialysis: Secondary | ICD-10-CM | POA: Diagnosis not present

## 2020-10-17 DIAGNOSIS — N186 End stage renal disease: Secondary | ICD-10-CM | POA: Diagnosis not present

## 2020-10-20 DIAGNOSIS — Z992 Dependence on renal dialysis: Secondary | ICD-10-CM | POA: Diagnosis not present

## 2020-10-20 DIAGNOSIS — N186 End stage renal disease: Secondary | ICD-10-CM | POA: Diagnosis not present

## 2020-10-21 DIAGNOSIS — Z992 Dependence on renal dialysis: Secondary | ICD-10-CM | POA: Diagnosis not present

## 2020-10-21 DIAGNOSIS — N186 End stage renal disease: Secondary | ICD-10-CM | POA: Diagnosis not present

## 2020-10-22 DIAGNOSIS — Z992 Dependence on renal dialysis: Secondary | ICD-10-CM | POA: Diagnosis not present

## 2020-10-22 DIAGNOSIS — N186 End stage renal disease: Secondary | ICD-10-CM | POA: Diagnosis not present

## 2020-10-24 DIAGNOSIS — Z992 Dependence on renal dialysis: Secondary | ICD-10-CM | POA: Diagnosis not present

## 2020-10-24 DIAGNOSIS — N186 End stage renal disease: Secondary | ICD-10-CM | POA: Diagnosis not present

## 2020-10-27 DIAGNOSIS — N186 End stage renal disease: Secondary | ICD-10-CM | POA: Diagnosis not present

## 2020-10-27 DIAGNOSIS — N2581 Secondary hyperparathyroidism of renal origin: Secondary | ICD-10-CM | POA: Diagnosis not present

## 2020-10-27 DIAGNOSIS — Z992 Dependence on renal dialysis: Secondary | ICD-10-CM | POA: Diagnosis not present

## 2020-10-29 DIAGNOSIS — Z992 Dependence on renal dialysis: Secondary | ICD-10-CM | POA: Diagnosis not present

## 2020-10-29 DIAGNOSIS — N186 End stage renal disease: Secondary | ICD-10-CM | POA: Diagnosis not present

## 2020-10-31 DIAGNOSIS — N186 End stage renal disease: Secondary | ICD-10-CM | POA: Diagnosis not present

## 2020-10-31 DIAGNOSIS — Z992 Dependence on renal dialysis: Secondary | ICD-10-CM | POA: Diagnosis not present

## 2020-11-03 DIAGNOSIS — N186 End stage renal disease: Secondary | ICD-10-CM | POA: Diagnosis not present

## 2020-11-03 DIAGNOSIS — Z992 Dependence on renal dialysis: Secondary | ICD-10-CM | POA: Diagnosis not present

## 2020-11-03 DIAGNOSIS — D509 Iron deficiency anemia, unspecified: Secondary | ICD-10-CM | POA: Diagnosis not present

## 2020-11-03 DIAGNOSIS — N2581 Secondary hyperparathyroidism of renal origin: Secondary | ICD-10-CM | POA: Diagnosis not present

## 2020-11-05 DIAGNOSIS — N186 End stage renal disease: Secondary | ICD-10-CM | POA: Diagnosis not present

## 2020-11-05 DIAGNOSIS — Z992 Dependence on renal dialysis: Secondary | ICD-10-CM | POA: Diagnosis not present

## 2020-11-07 DIAGNOSIS — N186 End stage renal disease: Secondary | ICD-10-CM | POA: Diagnosis not present

## 2020-11-07 DIAGNOSIS — Z992 Dependence on renal dialysis: Secondary | ICD-10-CM | POA: Diagnosis not present

## 2020-11-10 DIAGNOSIS — Z992 Dependence on renal dialysis: Secondary | ICD-10-CM | POA: Diagnosis not present

## 2020-11-10 DIAGNOSIS — N186 End stage renal disease: Secondary | ICD-10-CM | POA: Diagnosis not present

## 2020-11-11 DIAGNOSIS — N186 End stage renal disease: Secondary | ICD-10-CM | POA: Diagnosis not present

## 2020-11-11 DIAGNOSIS — Z992 Dependence on renal dialysis: Secondary | ICD-10-CM | POA: Diagnosis not present

## 2020-11-12 DIAGNOSIS — Z992 Dependence on renal dialysis: Secondary | ICD-10-CM | POA: Diagnosis not present

## 2020-11-12 DIAGNOSIS — N186 End stage renal disease: Secondary | ICD-10-CM | POA: Diagnosis not present

## 2020-11-14 DIAGNOSIS — N186 End stage renal disease: Secondary | ICD-10-CM | POA: Diagnosis not present

## 2020-11-14 DIAGNOSIS — Z992 Dependence on renal dialysis: Secondary | ICD-10-CM | POA: Diagnosis not present

## 2020-11-17 DIAGNOSIS — N2581 Secondary hyperparathyroidism of renal origin: Secondary | ICD-10-CM | POA: Diagnosis not present

## 2020-11-17 DIAGNOSIS — Z992 Dependence on renal dialysis: Secondary | ICD-10-CM | POA: Diagnosis not present

## 2020-11-17 DIAGNOSIS — N186 End stage renal disease: Secondary | ICD-10-CM | POA: Diagnosis not present

## 2020-11-19 DIAGNOSIS — N186 End stage renal disease: Secondary | ICD-10-CM | POA: Diagnosis not present

## 2020-11-19 DIAGNOSIS — Z992 Dependence on renal dialysis: Secondary | ICD-10-CM | POA: Diagnosis not present

## 2020-11-20 ENCOUNTER — Ambulatory Visit: Payer: Medicare Other | Admitting: Cardiology

## 2020-11-20 DIAGNOSIS — N186 End stage renal disease: Secondary | ICD-10-CM | POA: Diagnosis not present

## 2020-11-20 DIAGNOSIS — Z992 Dependence on renal dialysis: Secondary | ICD-10-CM | POA: Diagnosis not present

## 2020-11-21 DIAGNOSIS — N186 End stage renal disease: Secondary | ICD-10-CM | POA: Diagnosis not present

## 2020-11-21 DIAGNOSIS — Z992 Dependence on renal dialysis: Secondary | ICD-10-CM | POA: Diagnosis not present

## 2020-11-24 DIAGNOSIS — N186 End stage renal disease: Secondary | ICD-10-CM | POA: Diagnosis not present

## 2020-11-24 DIAGNOSIS — Z992 Dependence on renal dialysis: Secondary | ICD-10-CM | POA: Diagnosis not present

## 2020-11-26 DIAGNOSIS — Z992 Dependence on renal dialysis: Secondary | ICD-10-CM | POA: Diagnosis not present

## 2020-11-26 DIAGNOSIS — N186 End stage renal disease: Secondary | ICD-10-CM | POA: Diagnosis not present

## 2020-11-28 DIAGNOSIS — Z992 Dependence on renal dialysis: Secondary | ICD-10-CM | POA: Diagnosis not present

## 2020-11-28 DIAGNOSIS — N186 End stage renal disease: Secondary | ICD-10-CM | POA: Diagnosis not present

## 2020-12-01 DIAGNOSIS — N186 End stage renal disease: Secondary | ICD-10-CM | POA: Diagnosis not present

## 2020-12-01 DIAGNOSIS — Z992 Dependence on renal dialysis: Secondary | ICD-10-CM | POA: Diagnosis not present

## 2020-12-01 DIAGNOSIS — N2581 Secondary hyperparathyroidism of renal origin: Secondary | ICD-10-CM | POA: Diagnosis not present

## 2020-12-01 DIAGNOSIS — D509 Iron deficiency anemia, unspecified: Secondary | ICD-10-CM | POA: Diagnosis not present

## 2020-12-03 DIAGNOSIS — Z992 Dependence on renal dialysis: Secondary | ICD-10-CM | POA: Diagnosis not present

## 2020-12-03 DIAGNOSIS — N186 End stage renal disease: Secondary | ICD-10-CM | POA: Diagnosis not present

## 2020-12-05 DIAGNOSIS — Z992 Dependence on renal dialysis: Secondary | ICD-10-CM | POA: Diagnosis not present

## 2020-12-05 DIAGNOSIS — N186 End stage renal disease: Secondary | ICD-10-CM | POA: Diagnosis not present

## 2020-12-08 DIAGNOSIS — Z992 Dependence on renal dialysis: Secondary | ICD-10-CM | POA: Diagnosis not present

## 2020-12-08 DIAGNOSIS — N186 End stage renal disease: Secondary | ICD-10-CM | POA: Diagnosis not present

## 2020-12-10 DIAGNOSIS — N186 End stage renal disease: Secondary | ICD-10-CM | POA: Diagnosis not present

## 2020-12-10 DIAGNOSIS — Z992 Dependence on renal dialysis: Secondary | ICD-10-CM | POA: Diagnosis not present

## 2020-12-11 DIAGNOSIS — N186 End stage renal disease: Secondary | ICD-10-CM | POA: Diagnosis not present

## 2020-12-11 DIAGNOSIS — Z992 Dependence on renal dialysis: Secondary | ICD-10-CM | POA: Diagnosis not present

## 2020-12-12 DIAGNOSIS — N186 End stage renal disease: Secondary | ICD-10-CM | POA: Diagnosis not present

## 2020-12-12 DIAGNOSIS — Z992 Dependence on renal dialysis: Secondary | ICD-10-CM | POA: Diagnosis not present

## 2020-12-15 DIAGNOSIS — Z992 Dependence on renal dialysis: Secondary | ICD-10-CM | POA: Diagnosis not present

## 2020-12-15 DIAGNOSIS — N186 End stage renal disease: Secondary | ICD-10-CM | POA: Diagnosis not present

## 2020-12-15 DIAGNOSIS — N2581 Secondary hyperparathyroidism of renal origin: Secondary | ICD-10-CM | POA: Diagnosis not present

## 2020-12-16 DIAGNOSIS — M25512 Pain in left shoulder: Secondary | ICD-10-CM | POA: Diagnosis not present

## 2020-12-16 DIAGNOSIS — M25562 Pain in left knee: Secondary | ICD-10-CM | POA: Diagnosis not present

## 2020-12-16 DIAGNOSIS — M545 Low back pain, unspecified: Secondary | ICD-10-CM | POA: Diagnosis not present

## 2020-12-16 DIAGNOSIS — Z79891 Long term (current) use of opiate analgesic: Secondary | ICD-10-CM | POA: Diagnosis not present

## 2020-12-16 DIAGNOSIS — N08 Glomerular disorders in diseases classified elsewhere: Secondary | ICD-10-CM | POA: Diagnosis not present

## 2020-12-16 DIAGNOSIS — Z8679 Personal history of other diseases of the circulatory system: Secondary | ICD-10-CM | POA: Diagnosis not present

## 2020-12-16 DIAGNOSIS — M25569 Pain in unspecified knee: Secondary | ICD-10-CM | POA: Diagnosis not present

## 2020-12-17 DIAGNOSIS — N186 End stage renal disease: Secondary | ICD-10-CM | POA: Diagnosis not present

## 2020-12-17 DIAGNOSIS — Z992 Dependence on renal dialysis: Secondary | ICD-10-CM | POA: Diagnosis not present

## 2020-12-19 DIAGNOSIS — N2581 Secondary hyperparathyroidism of renal origin: Secondary | ICD-10-CM | POA: Diagnosis not present

## 2020-12-19 DIAGNOSIS — N186 End stage renal disease: Secondary | ICD-10-CM | POA: Diagnosis not present

## 2020-12-19 DIAGNOSIS — Z992 Dependence on renal dialysis: Secondary | ICD-10-CM | POA: Diagnosis not present

## 2020-12-21 DIAGNOSIS — N186 End stage renal disease: Secondary | ICD-10-CM | POA: Diagnosis not present

## 2020-12-21 DIAGNOSIS — Z992 Dependence on renal dialysis: Secondary | ICD-10-CM | POA: Diagnosis not present

## 2020-12-22 DIAGNOSIS — N186 End stage renal disease: Secondary | ICD-10-CM | POA: Diagnosis not present

## 2020-12-22 DIAGNOSIS — Z992 Dependence on renal dialysis: Secondary | ICD-10-CM | POA: Diagnosis not present

## 2020-12-24 DIAGNOSIS — Z992 Dependence on renal dialysis: Secondary | ICD-10-CM | POA: Diagnosis not present

## 2020-12-24 DIAGNOSIS — N186 End stage renal disease: Secondary | ICD-10-CM | POA: Diagnosis not present

## 2020-12-25 DIAGNOSIS — E782 Mixed hyperlipidemia: Secondary | ICD-10-CM | POA: Diagnosis not present

## 2020-12-25 DIAGNOSIS — N186 End stage renal disease: Secondary | ICD-10-CM | POA: Diagnosis not present

## 2020-12-25 DIAGNOSIS — J449 Chronic obstructive pulmonary disease, unspecified: Secondary | ICD-10-CM | POA: Diagnosis not present

## 2020-12-25 DIAGNOSIS — E7849 Other hyperlipidemia: Secondary | ICD-10-CM | POA: Diagnosis not present

## 2020-12-25 DIAGNOSIS — Z13220 Encounter for screening for lipoid disorders: Secondary | ICD-10-CM | POA: Diagnosis not present

## 2020-12-25 DIAGNOSIS — D519 Vitamin B12 deficiency anemia, unspecified: Secondary | ICD-10-CM | POA: Diagnosis not present

## 2020-12-26 DIAGNOSIS — N186 End stage renal disease: Secondary | ICD-10-CM | POA: Diagnosis not present

## 2020-12-26 DIAGNOSIS — Z992 Dependence on renal dialysis: Secondary | ICD-10-CM | POA: Diagnosis not present

## 2020-12-29 DIAGNOSIS — D509 Iron deficiency anemia, unspecified: Secondary | ICD-10-CM | POA: Diagnosis not present

## 2020-12-29 DIAGNOSIS — N186 End stage renal disease: Secondary | ICD-10-CM | POA: Diagnosis not present

## 2020-12-29 DIAGNOSIS — Z992 Dependence on renal dialysis: Secondary | ICD-10-CM | POA: Diagnosis not present

## 2020-12-29 DIAGNOSIS — N2581 Secondary hyperparathyroidism of renal origin: Secondary | ICD-10-CM | POA: Diagnosis not present

## 2020-12-30 DIAGNOSIS — J449 Chronic obstructive pulmonary disease, unspecified: Secondary | ICD-10-CM | POA: Diagnosis not present

## 2020-12-30 DIAGNOSIS — Z0001 Encounter for general adult medical examination with abnormal findings: Secondary | ICD-10-CM | POA: Diagnosis not present

## 2020-12-30 DIAGNOSIS — I779 Disorder of arteries and arterioles, unspecified: Secondary | ICD-10-CM | POA: Diagnosis not present

## 2020-12-30 DIAGNOSIS — F17210 Nicotine dependence, cigarettes, uncomplicated: Secondary | ICD-10-CM | POA: Diagnosis not present

## 2020-12-30 DIAGNOSIS — I471 Supraventricular tachycardia: Secondary | ICD-10-CM | POA: Diagnosis not present

## 2020-12-30 DIAGNOSIS — E7849 Other hyperlipidemia: Secondary | ICD-10-CM | POA: Diagnosis not present

## 2020-12-30 DIAGNOSIS — N186 End stage renal disease: Secondary | ICD-10-CM | POA: Diagnosis not present

## 2020-12-30 DIAGNOSIS — I700 Atherosclerosis of aorta: Secondary | ICD-10-CM | POA: Diagnosis not present

## 2020-12-31 DIAGNOSIS — Z992 Dependence on renal dialysis: Secondary | ICD-10-CM | POA: Diagnosis not present

## 2020-12-31 DIAGNOSIS — N186 End stage renal disease: Secondary | ICD-10-CM | POA: Diagnosis not present

## 2021-01-02 DIAGNOSIS — N186 End stage renal disease: Secondary | ICD-10-CM | POA: Diagnosis not present

## 2021-01-02 DIAGNOSIS — Z992 Dependence on renal dialysis: Secondary | ICD-10-CM | POA: Diagnosis not present

## 2021-01-05 DIAGNOSIS — Z992 Dependence on renal dialysis: Secondary | ICD-10-CM | POA: Diagnosis not present

## 2021-01-05 DIAGNOSIS — N186 End stage renal disease: Secondary | ICD-10-CM | POA: Diagnosis not present

## 2021-01-07 DIAGNOSIS — Z992 Dependence on renal dialysis: Secondary | ICD-10-CM | POA: Diagnosis not present

## 2021-01-07 DIAGNOSIS — N186 End stage renal disease: Secondary | ICD-10-CM | POA: Diagnosis not present

## 2021-01-09 DIAGNOSIS — Z992 Dependence on renal dialysis: Secondary | ICD-10-CM | POA: Diagnosis not present

## 2021-01-09 DIAGNOSIS — N186 End stage renal disease: Secondary | ICD-10-CM | POA: Diagnosis not present

## 2021-01-10 DIAGNOSIS — Z992 Dependence on renal dialysis: Secondary | ICD-10-CM | POA: Diagnosis not present

## 2021-01-10 DIAGNOSIS — N186 End stage renal disease: Secondary | ICD-10-CM | POA: Diagnosis not present

## 2021-01-12 DIAGNOSIS — N2581 Secondary hyperparathyroidism of renal origin: Secondary | ICD-10-CM | POA: Diagnosis not present

## 2021-01-12 DIAGNOSIS — Z992 Dependence on renal dialysis: Secondary | ICD-10-CM | POA: Diagnosis not present

## 2021-01-12 DIAGNOSIS — N186 End stage renal disease: Secondary | ICD-10-CM | POA: Diagnosis not present

## 2021-01-14 DIAGNOSIS — N186 End stage renal disease: Secondary | ICD-10-CM | POA: Diagnosis not present

## 2021-01-14 DIAGNOSIS — Z992 Dependence on renal dialysis: Secondary | ICD-10-CM | POA: Diagnosis not present

## 2021-01-16 DIAGNOSIS — N186 End stage renal disease: Secondary | ICD-10-CM | POA: Diagnosis not present

## 2021-01-16 DIAGNOSIS — Z992 Dependence on renal dialysis: Secondary | ICD-10-CM | POA: Diagnosis not present

## 2021-01-19 DIAGNOSIS — N186 End stage renal disease: Secondary | ICD-10-CM | POA: Diagnosis not present

## 2021-01-19 DIAGNOSIS — Z992 Dependence on renal dialysis: Secondary | ICD-10-CM | POA: Diagnosis not present

## 2021-01-21 DIAGNOSIS — Z992 Dependence on renal dialysis: Secondary | ICD-10-CM | POA: Diagnosis not present

## 2021-01-21 DIAGNOSIS — N186 End stage renal disease: Secondary | ICD-10-CM | POA: Diagnosis not present

## 2021-01-22 DIAGNOSIS — N186 End stage renal disease: Secondary | ICD-10-CM | POA: Diagnosis not present

## 2021-01-22 DIAGNOSIS — Z992 Dependence on renal dialysis: Secondary | ICD-10-CM | POA: Diagnosis not present

## 2021-01-23 DIAGNOSIS — Z992 Dependence on renal dialysis: Secondary | ICD-10-CM | POA: Diagnosis not present

## 2021-01-23 DIAGNOSIS — N186 End stage renal disease: Secondary | ICD-10-CM | POA: Diagnosis not present

## 2021-01-26 DIAGNOSIS — Z992 Dependence on renal dialysis: Secondary | ICD-10-CM | POA: Diagnosis not present

## 2021-01-26 DIAGNOSIS — N186 End stage renal disease: Secondary | ICD-10-CM | POA: Diagnosis not present

## 2021-01-28 DIAGNOSIS — Z992 Dependence on renal dialysis: Secondary | ICD-10-CM | POA: Diagnosis not present

## 2021-01-28 DIAGNOSIS — N2581 Secondary hyperparathyroidism of renal origin: Secondary | ICD-10-CM | POA: Diagnosis not present

## 2021-01-28 DIAGNOSIS — N186 End stage renal disease: Secondary | ICD-10-CM | POA: Diagnosis not present

## 2021-01-30 DIAGNOSIS — Z992 Dependence on renal dialysis: Secondary | ICD-10-CM | POA: Diagnosis not present

## 2021-01-30 DIAGNOSIS — N186 End stage renal disease: Secondary | ICD-10-CM | POA: Diagnosis not present

## 2021-02-02 DIAGNOSIS — N2581 Secondary hyperparathyroidism of renal origin: Secondary | ICD-10-CM | POA: Diagnosis not present

## 2021-02-02 DIAGNOSIS — N186 End stage renal disease: Secondary | ICD-10-CM | POA: Diagnosis not present

## 2021-02-02 DIAGNOSIS — Z992 Dependence on renal dialysis: Secondary | ICD-10-CM | POA: Diagnosis not present

## 2021-02-02 DIAGNOSIS — D509 Iron deficiency anemia, unspecified: Secondary | ICD-10-CM | POA: Diagnosis not present

## 2021-02-04 DIAGNOSIS — N186 End stage renal disease: Secondary | ICD-10-CM | POA: Diagnosis not present

## 2021-02-04 DIAGNOSIS — Z992 Dependence on renal dialysis: Secondary | ICD-10-CM | POA: Diagnosis not present

## 2021-02-06 DIAGNOSIS — Z992 Dependence on renal dialysis: Secondary | ICD-10-CM | POA: Diagnosis not present

## 2021-02-06 DIAGNOSIS — N186 End stage renal disease: Secondary | ICD-10-CM | POA: Diagnosis not present

## 2021-02-09 DIAGNOSIS — Z992 Dependence on renal dialysis: Secondary | ICD-10-CM | POA: Diagnosis not present

## 2021-02-09 DIAGNOSIS — N186 End stage renal disease: Secondary | ICD-10-CM | POA: Diagnosis not present

## 2021-02-11 DIAGNOSIS — Z992 Dependence on renal dialysis: Secondary | ICD-10-CM | POA: Diagnosis not present

## 2021-02-11 DIAGNOSIS — N186 End stage renal disease: Secondary | ICD-10-CM | POA: Diagnosis not present

## 2021-02-13 DIAGNOSIS — N186 End stage renal disease: Secondary | ICD-10-CM | POA: Diagnosis not present

## 2021-02-13 DIAGNOSIS — Z992 Dependence on renal dialysis: Secondary | ICD-10-CM | POA: Diagnosis not present

## 2021-02-16 DIAGNOSIS — N2581 Secondary hyperparathyroidism of renal origin: Secondary | ICD-10-CM | POA: Diagnosis not present

## 2021-02-16 DIAGNOSIS — Z992 Dependence on renal dialysis: Secondary | ICD-10-CM | POA: Diagnosis not present

## 2021-02-16 DIAGNOSIS — N186 End stage renal disease: Secondary | ICD-10-CM | POA: Diagnosis not present

## 2021-02-18 DIAGNOSIS — Z992 Dependence on renal dialysis: Secondary | ICD-10-CM | POA: Diagnosis not present

## 2021-02-18 DIAGNOSIS — N186 End stage renal disease: Secondary | ICD-10-CM | POA: Diagnosis not present

## 2021-02-20 DIAGNOSIS — Z992 Dependence on renal dialysis: Secondary | ICD-10-CM | POA: Diagnosis not present

## 2021-02-20 DIAGNOSIS — N186 End stage renal disease: Secondary | ICD-10-CM | POA: Diagnosis not present

## 2021-02-21 DIAGNOSIS — N186 End stage renal disease: Secondary | ICD-10-CM | POA: Diagnosis not present

## 2021-02-21 DIAGNOSIS — Z992 Dependence on renal dialysis: Secondary | ICD-10-CM | POA: Diagnosis not present

## 2021-02-23 DIAGNOSIS — N186 End stage renal disease: Secondary | ICD-10-CM | POA: Diagnosis not present

## 2021-02-23 DIAGNOSIS — Z992 Dependence on renal dialysis: Secondary | ICD-10-CM | POA: Diagnosis not present

## 2021-02-25 DIAGNOSIS — Z992 Dependence on renal dialysis: Secondary | ICD-10-CM | POA: Diagnosis not present

## 2021-02-25 DIAGNOSIS — N186 End stage renal disease: Secondary | ICD-10-CM | POA: Diagnosis not present

## 2021-02-27 DIAGNOSIS — N186 End stage renal disease: Secondary | ICD-10-CM | POA: Diagnosis not present

## 2021-02-27 DIAGNOSIS — Z992 Dependence on renal dialysis: Secondary | ICD-10-CM | POA: Diagnosis not present

## 2021-03-02 DIAGNOSIS — N2581 Secondary hyperparathyroidism of renal origin: Secondary | ICD-10-CM | POA: Diagnosis not present

## 2021-03-02 DIAGNOSIS — N186 End stage renal disease: Secondary | ICD-10-CM | POA: Diagnosis not present

## 2021-03-02 DIAGNOSIS — D509 Iron deficiency anemia, unspecified: Secondary | ICD-10-CM | POA: Diagnosis not present

## 2021-03-02 DIAGNOSIS — Z992 Dependence on renal dialysis: Secondary | ICD-10-CM | POA: Diagnosis not present

## 2021-03-04 DIAGNOSIS — Z992 Dependence on renal dialysis: Secondary | ICD-10-CM | POA: Diagnosis not present

## 2021-03-04 DIAGNOSIS — N186 End stage renal disease: Secondary | ICD-10-CM | POA: Diagnosis not present

## 2021-03-06 DIAGNOSIS — Z992 Dependence on renal dialysis: Secondary | ICD-10-CM | POA: Diagnosis not present

## 2021-03-06 DIAGNOSIS — N186 End stage renal disease: Secondary | ICD-10-CM | POA: Diagnosis not present

## 2021-03-09 DIAGNOSIS — Z992 Dependence on renal dialysis: Secondary | ICD-10-CM | POA: Diagnosis not present

## 2021-03-09 DIAGNOSIS — N186 End stage renal disease: Secondary | ICD-10-CM | POA: Diagnosis not present

## 2021-03-10 DIAGNOSIS — M545 Low back pain, unspecified: Secondary | ICD-10-CM | POA: Diagnosis not present

## 2021-03-10 DIAGNOSIS — M25562 Pain in left knee: Secondary | ICD-10-CM | POA: Diagnosis not present

## 2021-03-10 DIAGNOSIS — M25512 Pain in left shoulder: Secondary | ICD-10-CM | POA: Diagnosis not present

## 2021-03-10 DIAGNOSIS — N08 Glomerular disorders in diseases classified elsewhere: Secondary | ICD-10-CM | POA: Diagnosis not present

## 2021-03-10 DIAGNOSIS — Z8679 Personal history of other diseases of the circulatory system: Secondary | ICD-10-CM | POA: Diagnosis not present

## 2021-03-10 DIAGNOSIS — M25569 Pain in unspecified knee: Secondary | ICD-10-CM | POA: Diagnosis not present

## 2021-03-10 DIAGNOSIS — Z79891 Long term (current) use of opiate analgesic: Secondary | ICD-10-CM | POA: Diagnosis not present

## 2021-03-11 DIAGNOSIS — N186 End stage renal disease: Secondary | ICD-10-CM | POA: Diagnosis not present

## 2021-03-11 DIAGNOSIS — Z992 Dependence on renal dialysis: Secondary | ICD-10-CM | POA: Diagnosis not present

## 2021-03-13 DIAGNOSIS — Z992 Dependence on renal dialysis: Secondary | ICD-10-CM | POA: Diagnosis not present

## 2021-03-13 DIAGNOSIS — N186 End stage renal disease: Secondary | ICD-10-CM | POA: Diagnosis not present

## 2021-03-16 DIAGNOSIS — N186 End stage renal disease: Secondary | ICD-10-CM | POA: Diagnosis not present

## 2021-03-16 DIAGNOSIS — Z992 Dependence on renal dialysis: Secondary | ICD-10-CM | POA: Diagnosis not present

## 2021-03-16 DIAGNOSIS — N2581 Secondary hyperparathyroidism of renal origin: Secondary | ICD-10-CM | POA: Diagnosis not present

## 2021-03-18 DIAGNOSIS — Z992 Dependence on renal dialysis: Secondary | ICD-10-CM | POA: Diagnosis not present

## 2021-03-18 DIAGNOSIS — N186 End stage renal disease: Secondary | ICD-10-CM | POA: Diagnosis not present

## 2021-03-20 DIAGNOSIS — Z992 Dependence on renal dialysis: Secondary | ICD-10-CM | POA: Diagnosis not present

## 2021-03-20 DIAGNOSIS — N186 End stage renal disease: Secondary | ICD-10-CM | POA: Diagnosis not present

## 2021-03-23 DIAGNOSIS — N186 End stage renal disease: Secondary | ICD-10-CM | POA: Diagnosis not present

## 2021-03-23 DIAGNOSIS — Z992 Dependence on renal dialysis: Secondary | ICD-10-CM | POA: Diagnosis not present

## 2021-03-24 DIAGNOSIS — N186 End stage renal disease: Secondary | ICD-10-CM | POA: Diagnosis not present

## 2021-03-24 DIAGNOSIS — Z992 Dependence on renal dialysis: Secondary | ICD-10-CM | POA: Diagnosis not present

## 2021-03-25 DIAGNOSIS — N186 End stage renal disease: Secondary | ICD-10-CM | POA: Diagnosis not present

## 2021-03-25 DIAGNOSIS — Z992 Dependence on renal dialysis: Secondary | ICD-10-CM | POA: Diagnosis not present

## 2021-03-27 DIAGNOSIS — N186 End stage renal disease: Secondary | ICD-10-CM | POA: Diagnosis not present

## 2021-03-27 DIAGNOSIS — Z992 Dependence on renal dialysis: Secondary | ICD-10-CM | POA: Diagnosis not present

## 2021-03-29 NOTE — Progress Notes (Signed)
Cardiology Office Note  Date: 03/30/2021   ID: Brian Barrera, DOB 1971/04/12, MRN [id number]  PCP:  Curlene Labrum, MD  Cardiologist:  Carlyle Dolly, MD Electrophysiologist:  None   Chief Complaint: follow up  History of Present Illness: Brian Barrera is a 50 y.o. male with a history of Chronic diastolic HF, SVT, anemia, essential hypertension, COPD, ESRD, tobacco abuse, obesity.  Last encounter with Dr. Harl Bowie 04/12/2019.  Patient was seen for evaluation for renal transplant.  No chest pain, shortness of breath, DOE.  As part of work-up at [facility] patient had an echo with normal EF, no significant pathology.  Stress echo was attempted but unable to reach target heart rate.  Nuclear imaging stress test was ordered.  Patient presented to [facility], ED 07/02/2020 with arrhythmia.  Had completed one half of dialysis when heart rate was noted to be in the 180s.  He had palpitations in his chest.  He denied any history of SVT or atrial fibrillation.  Reported mild chest discomfort.  He was cardioverted chemically with IV adenosine.  He was asymptomatic following cardioversion with heart rate less than 100.  He was advised to follow-up with cardiology  Had an ED visit to [facility] emergency room on 08/06/2020 with complaints of palpitations chest discomfort starting during dialysis.  He was halfway through dialysis before he noted to be severely tachycardic at 160-170.[name]  He received IV adenosine once again as he did at previous visit on 07/02/2020 with conversion back to normal sinus rhythm.   At a prior visit he stated he was frustrated due to the recent 2 episodes of rapid heart rate in the middle of his dialysis treatments.  He stated about 2 hours into the dialysis session he has been having the heart rate speed up.  He stated that dialysis sessions are pulling more than his usual dry weight of approximately 1 kg beyond his dry weight.  He stated he is wondering if this may have some  relationship to his rapid heart rate.  We discussed the recent monitor results with no significant sustained arrhythmias.  Blood pressure is 104/78 .  He denies any anginal or exertional symptoms, palpitations or arrhythmias.  He was concerned the arrhythmia may happen again during dialysis.  Was last here for follow-up after medication changes at prior visit to prevent him from going into SVT during dialysis.  He stated the combination of 60 mg prior to dialysis and 30 mg during dialysis seem to be working.  He had no further episodes during dialysis.     He is here today for 64-month follow-up he states that he combination of medications are controlling his rapid heart rates.  He states the dialysis doctor decreased the dose of Cardizem and it still appears to be controlling his rapid heart rates during dialysis.  His blood pressure is well controlled.  Heart rate is slightly fast today at 100.  He states he recently finished dialysis this may be the reason for elevated heart rate.  Denies any SOB or DOE, PND, orthopnea, orthostatic symptoms, CVA or TIA-like symptoms, bleeding issues, claudication-like symptoms, DVT or PE-like symptoms, or lower extremity edema.  Past Medical History:  Diagnosis Date   Anemia    Attributed to chronic disease/chronic kidney disease   Chronic diastolic heart failure ([place]) 2011   EF 35% by echo in 2011; echo in 08/2011-normal EF, moderate to severe LVH; BNP level of [phone] in 2011-12   Chronic obstructive pulmonary  disease [facility])    with asthmatic component   ESRD (end stage renal disease) on dialysis ([place])    secondary hyperparathyroidism   Hypertension    h/o hypertensive crisis with encephalopathy   Obesity    Tobacco abuse    Approximate consumption of 25 pack years; continuing at 0.5 pack per day    Past Surgical History:  Procedure Laterality Date   ARTERIOVENOUS GRAFT PLACEMENT  2011   COLONOSCOPY N/A 02/04/2014   Procedure: COLONOSCOPY;  Surgeon:  [name], MD;  Location: [facility];  Service: Endoscopy;  Laterality: N/A;  12:00   COLONOSCOPY N/A 07/03/2019   Procedure: COLONOSCOPY;  Surgeon: [name], MD;  Location: [facility] ENDO [facility]ITE;  Service: Endoscopy;  Laterality: N/A;  12:00-office rescheduled 2/9 @ 11:00am   POLYPECTOMY  07/03/2019   Procedure: POLYPECTOMY;  Surgeon: [name], MD;  Location: [facility];  Service: Endoscopy;;    Current Outpatient Medications  Medication Sig Dispense Refill   albuterol (ACCUNEB) 1.25 MG/3ML nebulizer solution Take 1 ampule by nebulization as needed for wheezing or shortness of breath.     albuterol (PROAIR HFA) 108 (90 BASE) MCG/ACT inhaler Inhale 2 puffs into the lungs every 4 (four) hours as needed for wheezing. 3 Inhaler 0   aspirin EC 81 MG tablet Take 81 mg by mouth daily.     atorvastatin (LIPITOR) 10 MG tablet Take 10 mg by mouth daily.     AURYXIA 1 GM 210 MG(Fe) tablet Take 420 mg by mouth 3 (three) times daily with meals.      b complex-vitamin c-folic acid (NEPHRO-VITE) 0.8 MG TABS Take 0.8 mg by mouth daily.      budesonide-formoterol (SYMBICORT) 80-4.5 MCG/ACT inhaler Inhale 2 puffs into the lungs 2 (two) times daily. 1 Inhaler 12   cinacalcet (SENSIPAR) 60 MG tablet Take 60 mg by mouth daily.     diltiazem (CARDIZEM) 30 MG tablet Take 2 tabs (60mg ) by mouth every morning only on dialysis days - may take an extra 1 tab (30mg ) in the middle of dialysis as needed for rapid heart rate. 75 tablet 6   diphenhydramine-acetaminophen (TYLENOL PM) 25-500 MG TABS tablet Take 1 tablet by mouth at bedtime as needed (sleep).     DULoxetine (CYMBALTA) 30 MG capsule Take 30 mg by mouth daily.     gabapentin (NEURONTIN) 300 MG capsule Take 300 mg by mouth daily.     Omega-3 1000 MG CAPS Take 2,000 mg by mouth daily.     oxyCODONE-acetaminophen (PERCOCET) 7.5-325 MG tablet Take 1 tablet by mouth every 6 (six) hours as needed for moderate pain.     sevelamer carbonate (RENVELA) 800 MG  tablet Take 1,600-3,200 mg by mouth See admin instructions. Take 3200 mg with meals and 1600 with snack     No current facility-administered medications for this visit.   Allergies:  Patient has no known allergies.   Social History: The patient  reports that he has been smoking cigarettes. He started smoking about 35 years ago. He has a 12.50 pack-year smoking history. He has never used smokeless tobacco. He reports that he does not drink alcohol and does not use drugs.   Family History: The patient's family history includes Diabetes in his sister and sister; Heart disease in his father; Hypertension in his brother, brother, brother, mother, sister, sister, and sister; Kidney disease in his mother.   ROS:  Please see the history of present illness. Otherwise, complete review of systems is  positive for none.  All other systems are reviewed and negative.   Physical Exam: VS:  BP 126/86   Pulse 100   Ht 6' (1.829 m)   Wt 259 lb (117.5 kg)   BMI 35.13 kg/m , BMI Body mass index is 35.13 kg/m.  Wt Readings from Last 3 Encounters:  03/30/21 259 lb (117.5 kg)  10/16/20 256 lb 3.2 oz (116.2 kg)  09/05/20 254 lb 3.2 oz (115.3 kg)    General: Patient appears comfortable at rest. Neck: Supple, no elevated JVP or carotid bruits, no thyromegaly. Lungs: Clear to auscultation, nonlabored breathing at rest. Cardiac: Regular rate and rhythm, no S3 or significant systolic murmur, no pericardial rub. Extremities: No pitting edema, distal pulses 2+. Skin: Warm and dry. Musculoskeletal: No kyphosis. Neuropsychiatric: Alert and oriented x3, affect grossly appropriate.  ECG:  EKG at [facility], ED 07/02/2020 showed sinus tachycardia rate of 116, right atrial enlargement, consider biatrial enlargement, low voltage, precordial leads.  Recent Labwork: 07/02/2020: Magnesium 2.9 09/03/2020: BUN 46; Creatinine, Ser 11.63; Hemoglobin 16.2; Platelets 202; Potassium 3.9; Sodium 136     Component Value Date/Time    CHOL 175 01/04/2013 1140   TRIG 172 (H) 01/04/2013 1140   HDL 45 01/04/2013 1140   CHOLHDL 3.9 01/04/2013 1140   VLDL 34 01/04/2013 1140   LDLCALC 96 01/04/2013 1140    Other Studies Reviewed Today:  Cardiac monitor 08/06/2020 Study Highlights    14 day event monitor Rare supraventricular ectopy. Rare episodes of SVT up to 14 beats. Rare ventricular ectopy. One episode of NSVT lasting 6 beats. Symptoms reported but without sufficent data to correlate with heart rhythm at the time.     Patch Wear Time:  14 days and 0 hours (2022-02-22T18:05:23-0500 to 2022-03-08T18:05:27-0500)   Patient had a min HR of 64 bpm, max HR of 207 bpm, and avg HR of 92 bpm. Predominant underlying rhythm was Sinus Rhythm. 1 run of Ventricular Tachycardia occurred lasting 6 beats with a max rate of 160 bpm (avg 148 bpm). 7 Supraventricular Tachycardia  runs occurred, the run with the fastest interval lasting 14 beats with a max rate of 207 bpm (avg 165 bpm); the run with the fastest interval was also the longest. Isolated SVEs were rare (<1.0%), SVE Couplets were rare (<1.0%), and SVE Triplets were  rare (<1.0%). Isolated VEs were rare (<1.0%), VE Couplets were rare (<1.0%), and no VE Triplets were present. Ventricular Trigeminy was present. Inverted QRS complexes possibly due to inverted placement of device.       10/2018 [facility] stress echo Protocol: Treadmill (Modified [place])              Drugs: Beta Blockers   Target Heart Rate: 145  Maximum Predicted Heart Rate: 171        Resting ECG: Sinus rhythm      Protocol Note: LABETOLOL 200mg  BID, LAST DOSE 11/17/2018 AM      TYPE     STAGE    TIME    HR    BP    RPE  SPO2 COMMENTS   -------- ----- --------- --- ------- ----- ---- ------------------------------   Baseline                  90 105/ 63   BaseLine   1      6       90 105/ 63   BaseLine   2      1       88  97/ 67            STANDING BP   Stress     1    180 sec. 105 130/ 64  7/20   Stress     2     180 sec. 113 130/ 74 11/20   Stress     3    121 sec. 118 140/ 72 15/20      LEG DISCOMFORT 7/10   Recovery   1      1 min. 110 112/ 65   Recovery   2      2 min. 103 115/ 55   Recovery   3      4 min.  88 131/ 61   Recovery   4      6 min.  90 122/ 65   Recovery   5      8 min.  87 120/ 66            LEG DISCOMFORT RESOLVED       Stress Duration: 8.02 minutes.  Max Stress H.R: 118   Target Heart Rate (145) Achieved: No    Max. workload of  4.60  METs was achieved during exercise.        HR Response: Attenuated secondary to medication        BP Response: Normal resting BP - blunted response      WALL SEGMENT FINDINGS --------------------------------------------------------                            Rest            Stress                         --------------- ---------------   Anterior Septum Basal: Normal          Hyperkinetic                    Mid: Normal          Hyperkinetic          Apical Septum: Normal          Hyperkinetic       Anterior Wall Basal: Normal          Hyperkinetic                    Mid: Normal          Hyperkinetic                 Apical: Normal          Hyperkinetic        Lateral Wall Basal: Normal          Hyperkinetic                    Mid: Normal          Hyperkinetic                 Apical: Normal          Hyperkinetic      Posterior Wall Basal: Normal          Hyperkinetic                    Mid: Normal          Hyperkinetic       Inferior Wall Basal:  Normal          Hyperkinetic                    Mid: Normal          Hyperkinetic                 Apical: Normal          Hyperkinetic      Inferior Septum Basal: Normal          Hyperkinetic                    Mid: Normal          Hyperkinetic                        EF: >55%            >55%         ADDITIONAL FINDINGS ----------------------------------------------------------       Chest Discomfort: None          Arrhythmia: None   Termination Reason: Leg fatigue       Complications:  None      STRESS ECG RESULTS -----------------------------------------------------------      ECG Results: Nondiagnostic due to inadequate heart rate. .      INTERPRETATION ---------------------------------------------------------------      INDETERMINATE.   NO DOPPLER PERFORMED FOR VALVULAR REGURGITATION   NO DOPPLER PERFORMED FOR VALVULAR STENOSIS   Note: NO WALL MOTION ABNORMALITIES AT REST OR SUBMAXIMAL STRESS (PEAK   STRESS <118 bpm, TARGET = 145 bpm)   Maximum workload of  4.60 METs was achieved during exercise.   NORMAL RESTING BP - BLUNTED RESPONSE     10/2018 echo INTERPRETATION ---------------------------------------------------------------    NORMAL LEFT VENTRICULAR SYSTOLIC FUNCTION WITH MODERATE LVH    NORMAL LA PRESSURES WITH NORMAL DIASTOLIC FUNCTION    NORMAL RIGHT VENTRICULAR SYSTOLIC FUNCTION    VALVULAR REGURGITATION: TRIVIAL MR, TRIVIAL TR    NO VALVULAR STENOSIS    NO PRIOR STUDY FOR COMPARISON     NST 04/23/2019 Study Result  Narrative & Impression  No diagnostic ST segment changes to indicate ischemia. Small, mild intensity, inferior defect that is partially reversible at the apex in the setting of increased radiotracer uptake on rest imaging near the inferior wall. Otherwise defect is fixed and suggestive of soft tissue attenuation. Cannot exclude a mild inferior apical ischemic territory, although with reduced specificity. This is a low risk study. Nuclear stress EF: 53%.        Assessment and Plan:   1. SVT (supraventricular tachycardia) ([place]) States medication is keeping his SVT at bay during dialysis.  He states the nephrologist decreased the dosage of Cardizem down to 30 mg initially and 30 mg milligrams as needed.  No further episodes since medication changes.  Continue Cardizem as directed.  2. Essential hypertension BP today 126/86.  Continue Cardizem 30 mg on dialysis days.  Continue 30 mg of Cardizem as needed.  3. Chronic diastolic  heart failure [facility]) Last echocardiogram June 2020 showed normal LV function with moderate LVH.  Normal diastolic function.  Trivial MR, trivial TR.  Denies any shortness of breath, DOE, weight gain, lower extremity edema.  4. ESRD (end stage renal disease) on dialysis [facility]) Receives dialysis on Mondays, Wednesdays and Fridays.   He returns today and states he has had no further episodes since medication changes.  He states his nephrologist decreased initial Cardizem  dose from 60 mg down to 30 mg initially.  Advised to continue Cardizem 30 mg 1 hour prior to dialysis and may take an extra 30 mg in the middle of dialysis if he has increased heart rate/palpitations  5.  Hyperlipidemia He states his primary care provider recently started him on Lipitor.  Continue atorvastatin 10 mg daily.  Continue aspirin 81 mg daily.  Medication Adjustments/Labs and Tests Ordered: Current medicines are reviewed at length with the patient today.  Concerns regarding medicines are outlined above.   Disposition: Follow-up with Dr. Harl Bowie or APP 6 months  Signed, Levell July, NP 03/30/2021 3:32 PM    [facility] [address], [place], [place] [address] Phone: [phone]; Fax: [phone]

## 2021-03-30 ENCOUNTER — Encounter: Payer: Self-pay | Admitting: Family Medicine

## 2021-03-30 ENCOUNTER — Ambulatory Visit (INDEPENDENT_AMBULATORY_CARE_PROVIDER_SITE_OTHER): Payer: Medicare Other | Admitting: Family Medicine

## 2021-03-30 VITALS — BP 126/86 | HR 100 | Ht 72.0 in | Wt 259.0 lb

## 2021-03-30 DIAGNOSIS — N186 End stage renal disease: Secondary | ICD-10-CM | POA: Diagnosis not present

## 2021-03-30 DIAGNOSIS — Z992 Dependence on renal dialysis: Secondary | ICD-10-CM

## 2021-03-30 DIAGNOSIS — I10 Essential (primary) hypertension: Secondary | ICD-10-CM

## 2021-03-30 DIAGNOSIS — I5032 Chronic diastolic (congestive) heart failure: Secondary | ICD-10-CM | POA: Diagnosis not present

## 2021-03-30 DIAGNOSIS — I471 Supraventricular tachycardia: Secondary | ICD-10-CM

## 2021-03-30 DIAGNOSIS — N2581 Secondary hyperparathyroidism of renal origin: Secondary | ICD-10-CM | POA: Diagnosis not present

## 2021-03-30 DIAGNOSIS — E785 Hyperlipidemia, unspecified: Secondary | ICD-10-CM | POA: Diagnosis not present

## 2021-03-30 NOTE — Patient Instructions (Signed)
Medication Instructions:   Your physician recommends that you continue on your current medications as directed. Please refer to the Current Medication list given to you today.  *If you need a refill on your cardiac medications before your next appointment, please call your pharmacy*   Lab Work: NONE ORDERED  TODAY   If you have labs (blood work) drawn today and your tests are completely normal, you will receive your results only by: MyChart Message (if you have MyChart) OR A paper copy in the mail If you have any lab test that is abnormal or we need to change your treatment, we will call you to review the results.   Testing/Procedures:NONE ORDERED  TODAY   Follow-Up: At [facility], you and your health needs are our priority.  As part of our continuing mission to provide you with exceptional heart care, we have created designated Provider Care Teams.  These Care Teams include your primary Cardiologist (physician) and Advanced Practice Providers (APPs -  Physician Assistants and Nurse Practitioners) who all work together to provide you with the care you need, when you need it.  We recommend signing up for the patient portal called "MyChart".  Sign up information is provided on this After Visit Summary.  MyChart is used to connect with patients for Virtual Visits (Telemedicine).  Patients are able to view lab/test results, encounter notes, upcoming appointments, etc.  Non-urgent messages can be sent to your provider as well.   To learn more about what you can do with MyChart, go to [email or web address].    Your next appointment:   6 month(s)  The format for your next appointment:   In Person  Provider:   You may see Branch, Jonathan, MD or the following Advanced Practice Provider on your designated Care Team:   Andy Quinn, NP    Other Instructions  

## 2021-04-01 DIAGNOSIS — Z992 Dependence on renal dialysis: Secondary | ICD-10-CM | POA: Diagnosis not present

## 2021-04-01 DIAGNOSIS — N186 End stage renal disease: Secondary | ICD-10-CM | POA: Diagnosis not present

## 2021-04-03 DIAGNOSIS — N186 End stage renal disease: Secondary | ICD-10-CM | POA: Diagnosis not present

## 2021-04-03 DIAGNOSIS — Z992 Dependence on renal dialysis: Secondary | ICD-10-CM | POA: Diagnosis not present

## 2021-04-06 DIAGNOSIS — N2581 Secondary hyperparathyroidism of renal origin: Secondary | ICD-10-CM | POA: Diagnosis not present

## 2021-04-06 DIAGNOSIS — N186 End stage renal disease: Secondary | ICD-10-CM | POA: Diagnosis not present

## 2021-04-06 DIAGNOSIS — Z992 Dependence on renal dialysis: Secondary | ICD-10-CM | POA: Diagnosis not present

## 2021-04-06 DIAGNOSIS — D509 Iron deficiency anemia, unspecified: Secondary | ICD-10-CM | POA: Diagnosis not present

## 2021-04-08 DIAGNOSIS — Z992 Dependence on renal dialysis: Secondary | ICD-10-CM | POA: Diagnosis not present

## 2021-04-08 DIAGNOSIS — N186 End stage renal disease: Secondary | ICD-10-CM | POA: Diagnosis not present

## 2021-04-10 DIAGNOSIS — N186 End stage renal disease: Secondary | ICD-10-CM | POA: Diagnosis not present

## 2021-04-10 DIAGNOSIS — Z992 Dependence on renal dialysis: Secondary | ICD-10-CM | POA: Diagnosis not present

## 2021-04-13 DIAGNOSIS — N186 End stage renal disease: Secondary | ICD-10-CM | POA: Diagnosis not present

## 2021-04-13 DIAGNOSIS — Z992 Dependence on renal dialysis: Secondary | ICD-10-CM | POA: Diagnosis not present

## 2021-04-15 DIAGNOSIS — N186 End stage renal disease: Secondary | ICD-10-CM | POA: Diagnosis not present

## 2021-04-15 DIAGNOSIS — Z992 Dependence on renal dialysis: Secondary | ICD-10-CM | POA: Diagnosis not present

## 2021-04-17 DIAGNOSIS — N186 End stage renal disease: Secondary | ICD-10-CM | POA: Diagnosis not present

## 2021-04-17 DIAGNOSIS — Z992 Dependence on renal dialysis: Secondary | ICD-10-CM | POA: Diagnosis not present

## 2021-04-20 DIAGNOSIS — Z992 Dependence on renal dialysis: Secondary | ICD-10-CM | POA: Diagnosis not present

## 2021-04-20 DIAGNOSIS — N2581 Secondary hyperparathyroidism of renal origin: Secondary | ICD-10-CM | POA: Diagnosis not present

## 2021-04-20 DIAGNOSIS — N186 End stage renal disease: Secondary | ICD-10-CM | POA: Diagnosis not present

## 2021-04-21 ENCOUNTER — Ambulatory Visit: Payer: Medicare Other | Admitting: Family Medicine

## 2021-04-22 DIAGNOSIS — N186 End stage renal disease: Secondary | ICD-10-CM | POA: Diagnosis not present

## 2021-04-22 DIAGNOSIS — Z992 Dependence on renal dialysis: Secondary | ICD-10-CM | POA: Diagnosis not present

## 2021-04-23 DIAGNOSIS — Z992 Dependence on renal dialysis: Secondary | ICD-10-CM | POA: Diagnosis not present

## 2021-04-23 DIAGNOSIS — N186 End stage renal disease: Secondary | ICD-10-CM | POA: Diagnosis not present

## 2021-04-24 DIAGNOSIS — N186 End stage renal disease: Secondary | ICD-10-CM | POA: Diagnosis not present

## 2021-04-24 DIAGNOSIS — Z992 Dependence on renal dialysis: Secondary | ICD-10-CM | POA: Diagnosis not present

## 2021-04-27 DIAGNOSIS — N186 End stage renal disease: Secondary | ICD-10-CM | POA: Diagnosis not present

## 2021-04-27 DIAGNOSIS — Z992 Dependence on renal dialysis: Secondary | ICD-10-CM | POA: Diagnosis not present

## 2021-04-29 DIAGNOSIS — Z992 Dependence on renal dialysis: Secondary | ICD-10-CM | POA: Diagnosis not present

## 2021-04-29 DIAGNOSIS — N186 End stage renal disease: Secondary | ICD-10-CM | POA: Diagnosis not present

## 2021-05-01 DIAGNOSIS — Z992 Dependence on renal dialysis: Secondary | ICD-10-CM | POA: Diagnosis not present

## 2021-05-01 DIAGNOSIS — N186 End stage renal disease: Secondary | ICD-10-CM | POA: Diagnosis not present

## 2021-05-04 DIAGNOSIS — Z992 Dependence on renal dialysis: Secondary | ICD-10-CM | POA: Diagnosis not present

## 2021-05-04 DIAGNOSIS — N2581 Secondary hyperparathyroidism of renal origin: Secondary | ICD-10-CM | POA: Diagnosis not present

## 2021-05-04 DIAGNOSIS — D509 Iron deficiency anemia, unspecified: Secondary | ICD-10-CM | POA: Diagnosis not present

## 2021-05-04 DIAGNOSIS — N186 End stage renal disease: Secondary | ICD-10-CM | POA: Diagnosis not present

## 2021-05-06 DIAGNOSIS — N186 End stage renal disease: Secondary | ICD-10-CM | POA: Diagnosis not present

## 2021-05-06 DIAGNOSIS — Z992 Dependence on renal dialysis: Secondary | ICD-10-CM | POA: Diagnosis not present

## 2021-05-08 DIAGNOSIS — Z992 Dependence on renal dialysis: Secondary | ICD-10-CM | POA: Diagnosis not present

## 2021-05-08 DIAGNOSIS — N186 End stage renal disease: Secondary | ICD-10-CM | POA: Diagnosis not present

## 2021-05-10 DIAGNOSIS — N186 End stage renal disease: Secondary | ICD-10-CM | POA: Diagnosis not present

## 2021-05-10 DIAGNOSIS — Z992 Dependence on renal dialysis: Secondary | ICD-10-CM | POA: Diagnosis not present

## 2021-05-11 DIAGNOSIS — N186 End stage renal disease: Secondary | ICD-10-CM | POA: Diagnosis not present

## 2021-05-11 DIAGNOSIS — Z992 Dependence on renal dialysis: Secondary | ICD-10-CM | POA: Diagnosis not present

## 2021-05-13 DIAGNOSIS — N186 End stage renal disease: Secondary | ICD-10-CM | POA: Diagnosis not present

## 2021-05-13 DIAGNOSIS — Z992 Dependence on renal dialysis: Secondary | ICD-10-CM | POA: Diagnosis not present

## 2021-05-15 DIAGNOSIS — N186 End stage renal disease: Secondary | ICD-10-CM | POA: Diagnosis not present

## 2021-05-15 DIAGNOSIS — Z992 Dependence on renal dialysis: Secondary | ICD-10-CM | POA: Diagnosis not present

## 2021-05-18 DIAGNOSIS — Z992 Dependence on renal dialysis: Secondary | ICD-10-CM | POA: Diagnosis not present

## 2021-05-18 DIAGNOSIS — N186 End stage renal disease: Secondary | ICD-10-CM | POA: Diagnosis not present

## 2021-05-20 DIAGNOSIS — Z992 Dependence on renal dialysis: Secondary | ICD-10-CM | POA: Diagnosis not present

## 2021-05-20 DIAGNOSIS — N186 End stage renal disease: Secondary | ICD-10-CM | POA: Diagnosis not present

## 2021-05-20 DIAGNOSIS — N2581 Secondary hyperparathyroidism of renal origin: Secondary | ICD-10-CM | POA: Diagnosis not present

## 2021-05-22 DIAGNOSIS — Z992 Dependence on renal dialysis: Secondary | ICD-10-CM | POA: Diagnosis not present

## 2021-05-22 DIAGNOSIS — N186 End stage renal disease: Secondary | ICD-10-CM | POA: Diagnosis not present

## 2021-05-23 DIAGNOSIS — Z992 Dependence on renal dialysis: Secondary | ICD-10-CM | POA: Diagnosis not present

## 2021-05-23 DIAGNOSIS — N186 End stage renal disease: Secondary | ICD-10-CM | POA: Diagnosis not present

## 2021-05-24 DIAGNOSIS — Z992 Dependence on renal dialysis: Secondary | ICD-10-CM | POA: Diagnosis not present

## 2021-05-24 DIAGNOSIS — N186 End stage renal disease: Secondary | ICD-10-CM | POA: Diagnosis not present

## 2021-05-25 DIAGNOSIS — N186 End stage renal disease: Secondary | ICD-10-CM | POA: Diagnosis not present

## 2021-05-25 DIAGNOSIS — Z992 Dependence on renal dialysis: Secondary | ICD-10-CM | POA: Diagnosis not present

## 2021-05-27 DIAGNOSIS — N186 End stage renal disease: Secondary | ICD-10-CM | POA: Diagnosis not present

## 2021-05-27 DIAGNOSIS — Z992 Dependence on renal dialysis: Secondary | ICD-10-CM | POA: Diagnosis not present

## 2021-05-28 DIAGNOSIS — M545 Low back pain, unspecified: Secondary | ICD-10-CM | POA: Diagnosis not present

## 2021-05-28 DIAGNOSIS — Z79891 Long term (current) use of opiate analgesic: Secondary | ICD-10-CM | POA: Diagnosis not present

## 2021-05-28 DIAGNOSIS — M25562 Pain in left knee: Secondary | ICD-10-CM | POA: Diagnosis not present

## 2021-05-28 DIAGNOSIS — N08 Glomerular disorders in diseases classified elsewhere: Secondary | ICD-10-CM | POA: Diagnosis not present

## 2021-05-28 DIAGNOSIS — Z8679 Personal history of other diseases of the circulatory system: Secondary | ICD-10-CM | POA: Diagnosis not present

## 2021-05-28 DIAGNOSIS — M25569 Pain in unspecified knee: Secondary | ICD-10-CM | POA: Diagnosis not present

## 2021-05-28 DIAGNOSIS — M25512 Pain in left shoulder: Secondary | ICD-10-CM | POA: Diagnosis not present

## 2021-05-29 DIAGNOSIS — N186 End stage renal disease: Secondary | ICD-10-CM | POA: Diagnosis not present

## 2021-05-29 DIAGNOSIS — Z992 Dependence on renal dialysis: Secondary | ICD-10-CM | POA: Diagnosis not present

## 2021-06-01 DIAGNOSIS — N186 End stage renal disease: Secondary | ICD-10-CM | POA: Diagnosis not present

## 2021-06-01 DIAGNOSIS — N2581 Secondary hyperparathyroidism of renal origin: Secondary | ICD-10-CM | POA: Diagnosis not present

## 2021-06-01 DIAGNOSIS — Z992 Dependence on renal dialysis: Secondary | ICD-10-CM | POA: Diagnosis not present

## 2021-06-01 DIAGNOSIS — D509 Iron deficiency anemia, unspecified: Secondary | ICD-10-CM | POA: Diagnosis not present

## 2021-06-03 DIAGNOSIS — N186 End stage renal disease: Secondary | ICD-10-CM | POA: Diagnosis not present

## 2021-06-03 DIAGNOSIS — Z992 Dependence on renal dialysis: Secondary | ICD-10-CM | POA: Diagnosis not present

## 2021-06-05 DIAGNOSIS — Z992 Dependence on renal dialysis: Secondary | ICD-10-CM | POA: Diagnosis not present

## 2021-06-05 DIAGNOSIS — N186 End stage renal disease: Secondary | ICD-10-CM | POA: Diagnosis not present

## 2021-06-08 DIAGNOSIS — Z992 Dependence on renal dialysis: Secondary | ICD-10-CM | POA: Diagnosis not present

## 2021-06-08 DIAGNOSIS — N186 End stage renal disease: Secondary | ICD-10-CM | POA: Diagnosis not present

## 2021-06-09 DIAGNOSIS — Z992 Dependence on renal dialysis: Secondary | ICD-10-CM | POA: Diagnosis not present

## 2021-06-09 DIAGNOSIS — N186 End stage renal disease: Secondary | ICD-10-CM | POA: Diagnosis not present

## 2021-06-10 DIAGNOSIS — Z992 Dependence on renal dialysis: Secondary | ICD-10-CM | POA: Diagnosis not present

## 2021-06-10 DIAGNOSIS — N186 End stage renal disease: Secondary | ICD-10-CM | POA: Diagnosis not present

## 2021-06-12 DIAGNOSIS — N186 End stage renal disease: Secondary | ICD-10-CM | POA: Diagnosis not present

## 2021-06-12 DIAGNOSIS — Z992 Dependence on renal dialysis: Secondary | ICD-10-CM | POA: Diagnosis not present

## 2021-06-15 DIAGNOSIS — N2581 Secondary hyperparathyroidism of renal origin: Secondary | ICD-10-CM | POA: Diagnosis not present

## 2021-06-15 DIAGNOSIS — N186 End stage renal disease: Secondary | ICD-10-CM | POA: Diagnosis not present

## 2021-06-15 DIAGNOSIS — Z992 Dependence on renal dialysis: Secondary | ICD-10-CM | POA: Diagnosis not present

## 2021-06-17 DIAGNOSIS — N186 End stage renal disease: Secondary | ICD-10-CM | POA: Diagnosis not present

## 2021-06-17 DIAGNOSIS — Z992 Dependence on renal dialysis: Secondary | ICD-10-CM | POA: Diagnosis not present

## 2021-06-19 DIAGNOSIS — Z992 Dependence on renal dialysis: Secondary | ICD-10-CM | POA: Diagnosis not present

## 2021-06-19 DIAGNOSIS — N186 End stage renal disease: Secondary | ICD-10-CM | POA: Diagnosis not present

## 2021-06-22 DIAGNOSIS — Z992 Dependence on renal dialysis: Secondary | ICD-10-CM | POA: Diagnosis not present

## 2021-06-22 DIAGNOSIS — N186 End stage renal disease: Secondary | ICD-10-CM | POA: Diagnosis not present

## 2021-06-23 DIAGNOSIS — N186 End stage renal disease: Secondary | ICD-10-CM | POA: Diagnosis not present

## 2021-06-23 DIAGNOSIS — Z992 Dependence on renal dialysis: Secondary | ICD-10-CM | POA: Diagnosis not present

## 2021-06-24 DIAGNOSIS — Z992 Dependence on renal dialysis: Secondary | ICD-10-CM | POA: Diagnosis not present

## 2021-06-24 DIAGNOSIS — N186 End stage renal disease: Secondary | ICD-10-CM | POA: Diagnosis not present

## 2021-06-25 DIAGNOSIS — E7849 Other hyperlipidemia: Secondary | ICD-10-CM | POA: Diagnosis not present

## 2021-06-25 DIAGNOSIS — N186 End stage renal disease: Secondary | ICD-10-CM | POA: Diagnosis not present

## 2021-06-25 DIAGNOSIS — J449 Chronic obstructive pulmonary disease, unspecified: Secondary | ICD-10-CM | POA: Diagnosis not present

## 2021-06-25 DIAGNOSIS — F17210 Nicotine dependence, cigarettes, uncomplicated: Secondary | ICD-10-CM | POA: Diagnosis not present

## 2021-06-25 DIAGNOSIS — E782 Mixed hyperlipidemia: Secondary | ICD-10-CM | POA: Diagnosis not present

## 2021-06-26 DIAGNOSIS — N186 End stage renal disease: Secondary | ICD-10-CM | POA: Diagnosis not present

## 2021-06-26 DIAGNOSIS — Z992 Dependence on renal dialysis: Secondary | ICD-10-CM | POA: Diagnosis not present

## 2021-06-29 DIAGNOSIS — Z992 Dependence on renal dialysis: Secondary | ICD-10-CM | POA: Diagnosis not present

## 2021-06-29 DIAGNOSIS — N2581 Secondary hyperparathyroidism of renal origin: Secondary | ICD-10-CM | POA: Diagnosis not present

## 2021-06-29 DIAGNOSIS — N186 End stage renal disease: Secondary | ICD-10-CM | POA: Diagnosis not present

## 2021-06-30 ENCOUNTER — Other Ambulatory Visit (HOSPITAL_COMMUNITY): Payer: Self-pay | Admitting: Family Medicine

## 2021-06-30 DIAGNOSIS — J449 Chronic obstructive pulmonary disease, unspecified: Secondary | ICD-10-CM | POA: Diagnosis not present

## 2021-06-30 DIAGNOSIS — F17210 Nicotine dependence, cigarettes, uncomplicated: Secondary | ICD-10-CM | POA: Diagnosis not present

## 2021-06-30 DIAGNOSIS — Z0001 Encounter for general adult medical examination with abnormal findings: Secondary | ICD-10-CM | POA: Diagnosis not present

## 2021-06-30 DIAGNOSIS — I700 Atherosclerosis of aorta: Secondary | ICD-10-CM | POA: Diagnosis not present

## 2021-06-30 DIAGNOSIS — N186 End stage renal disease: Secondary | ICD-10-CM | POA: Diagnosis not present

## 2021-06-30 DIAGNOSIS — Z1389 Encounter for screening for other disorder: Secondary | ICD-10-CM | POA: Diagnosis not present

## 2021-06-30 DIAGNOSIS — F17200 Nicotine dependence, unspecified, uncomplicated: Secondary | ICD-10-CM

## 2021-06-30 DIAGNOSIS — I779 Disorder of arteries and arterioles, unspecified: Secondary | ICD-10-CM | POA: Diagnosis not present

## 2021-07-01 DIAGNOSIS — Z992 Dependence on renal dialysis: Secondary | ICD-10-CM | POA: Diagnosis not present

## 2021-07-01 DIAGNOSIS — N186 End stage renal disease: Secondary | ICD-10-CM | POA: Diagnosis not present

## 2021-07-03 DIAGNOSIS — Z992 Dependence on renal dialysis: Secondary | ICD-10-CM | POA: Diagnosis not present

## 2021-07-03 DIAGNOSIS — N186 End stage renal disease: Secondary | ICD-10-CM | POA: Diagnosis not present

## 2021-07-06 DIAGNOSIS — D509 Iron deficiency anemia, unspecified: Secondary | ICD-10-CM | POA: Diagnosis not present

## 2021-07-06 DIAGNOSIS — N186 End stage renal disease: Secondary | ICD-10-CM | POA: Diagnosis not present

## 2021-07-06 DIAGNOSIS — N2581 Secondary hyperparathyroidism of renal origin: Secondary | ICD-10-CM | POA: Diagnosis not present

## 2021-07-06 DIAGNOSIS — Z992 Dependence on renal dialysis: Secondary | ICD-10-CM | POA: Diagnosis not present

## 2021-07-08 DIAGNOSIS — N186 End stage renal disease: Secondary | ICD-10-CM | POA: Diagnosis not present

## 2021-07-08 DIAGNOSIS — Z992 Dependence on renal dialysis: Secondary | ICD-10-CM | POA: Diagnosis not present

## 2021-07-09 DIAGNOSIS — Z992 Dependence on renal dialysis: Secondary | ICD-10-CM | POA: Diagnosis not present

## 2021-07-09 DIAGNOSIS — N186 End stage renal disease: Secondary | ICD-10-CM | POA: Diagnosis not present

## 2021-07-10 DIAGNOSIS — N186 End stage renal disease: Secondary | ICD-10-CM | POA: Diagnosis not present

## 2021-07-10 DIAGNOSIS — Z992 Dependence on renal dialysis: Secondary | ICD-10-CM | POA: Diagnosis not present

## 2021-07-13 DIAGNOSIS — N186 End stage renal disease: Secondary | ICD-10-CM | POA: Diagnosis not present

## 2021-07-13 DIAGNOSIS — Z992 Dependence on renal dialysis: Secondary | ICD-10-CM | POA: Diagnosis not present

## 2021-07-15 DIAGNOSIS — Z992 Dependence on renal dialysis: Secondary | ICD-10-CM | POA: Diagnosis not present

## 2021-07-15 DIAGNOSIS — N186 End stage renal disease: Secondary | ICD-10-CM | POA: Diagnosis not present

## 2021-07-15 DIAGNOSIS — D509 Iron deficiency anemia, unspecified: Secondary | ICD-10-CM | POA: Diagnosis not present

## 2021-07-17 DIAGNOSIS — Z992 Dependence on renal dialysis: Secondary | ICD-10-CM | POA: Diagnosis not present

## 2021-07-17 DIAGNOSIS — N186 End stage renal disease: Secondary | ICD-10-CM | POA: Diagnosis not present

## 2021-07-20 DIAGNOSIS — Z992 Dependence on renal dialysis: Secondary | ICD-10-CM | POA: Diagnosis not present

## 2021-07-20 DIAGNOSIS — N186 End stage renal disease: Secondary | ICD-10-CM | POA: Diagnosis not present

## 2021-07-20 DIAGNOSIS — N2581 Secondary hyperparathyroidism of renal origin: Secondary | ICD-10-CM | POA: Diagnosis not present

## 2021-07-21 DIAGNOSIS — Z992 Dependence on renal dialysis: Secondary | ICD-10-CM | POA: Diagnosis not present

## 2021-07-21 DIAGNOSIS — N186 End stage renal disease: Secondary | ICD-10-CM | POA: Diagnosis not present

## 2021-07-22 DIAGNOSIS — Z992 Dependence on renal dialysis: Secondary | ICD-10-CM | POA: Diagnosis not present

## 2021-07-22 DIAGNOSIS — N186 End stage renal disease: Secondary | ICD-10-CM | POA: Diagnosis not present

## 2021-07-24 DIAGNOSIS — N186 End stage renal disease: Secondary | ICD-10-CM | POA: Diagnosis not present

## 2021-07-24 DIAGNOSIS — Z992 Dependence on renal dialysis: Secondary | ICD-10-CM | POA: Diagnosis not present

## 2021-07-27 DIAGNOSIS — Z992 Dependence on renal dialysis: Secondary | ICD-10-CM | POA: Diagnosis not present

## 2021-07-27 DIAGNOSIS — N186 End stage renal disease: Secondary | ICD-10-CM | POA: Diagnosis not present

## 2021-07-29 DIAGNOSIS — N186 End stage renal disease: Secondary | ICD-10-CM | POA: Diagnosis not present

## 2021-07-29 DIAGNOSIS — Z992 Dependence on renal dialysis: Secondary | ICD-10-CM | POA: Diagnosis not present

## 2021-07-31 DIAGNOSIS — Z992 Dependence on renal dialysis: Secondary | ICD-10-CM | POA: Diagnosis not present

## 2021-07-31 DIAGNOSIS — N186 End stage renal disease: Secondary | ICD-10-CM | POA: Diagnosis not present

## 2021-08-03 DIAGNOSIS — N186 End stage renal disease: Secondary | ICD-10-CM | POA: Diagnosis not present

## 2021-08-03 DIAGNOSIS — Z992 Dependence on renal dialysis: Secondary | ICD-10-CM | POA: Diagnosis not present

## 2021-08-05 DIAGNOSIS — N186 End stage renal disease: Secondary | ICD-10-CM | POA: Diagnosis not present

## 2021-08-05 DIAGNOSIS — Z992 Dependence on renal dialysis: Secondary | ICD-10-CM | POA: Diagnosis not present

## 2021-08-07 DIAGNOSIS — Z992 Dependence on renal dialysis: Secondary | ICD-10-CM | POA: Diagnosis not present

## 2021-08-07 DIAGNOSIS — N186 End stage renal disease: Secondary | ICD-10-CM | POA: Diagnosis not present

## 2021-08-10 DIAGNOSIS — N186 End stage renal disease: Secondary | ICD-10-CM | POA: Diagnosis not present

## 2021-08-10 DIAGNOSIS — Z992 Dependence on renal dialysis: Secondary | ICD-10-CM | POA: Diagnosis not present

## 2021-08-11 ENCOUNTER — Ambulatory Visit (HOSPITAL_COMMUNITY): Admission: RE | Admit: 2021-08-11 | Payer: Medicare Other | Source: Ambulatory Visit

## 2021-08-11 ENCOUNTER — Encounter (HOSPITAL_COMMUNITY): Payer: Self-pay

## 2021-08-12 DIAGNOSIS — Z992 Dependence on renal dialysis: Secondary | ICD-10-CM | POA: Diagnosis not present

## 2021-08-12 DIAGNOSIS — N186 End stage renal disease: Secondary | ICD-10-CM | POA: Diagnosis not present

## 2021-08-14 DIAGNOSIS — Z992 Dependence on renal dialysis: Secondary | ICD-10-CM | POA: Diagnosis not present

## 2021-08-14 DIAGNOSIS — N186 End stage renal disease: Secondary | ICD-10-CM | POA: Diagnosis not present

## 2021-08-17 DIAGNOSIS — Z992 Dependence on renal dialysis: Secondary | ICD-10-CM | POA: Diagnosis not present

## 2021-08-17 DIAGNOSIS — N186 End stage renal disease: Secondary | ICD-10-CM | POA: Diagnosis not present

## 2021-08-19 DIAGNOSIS — Z992 Dependence on renal dialysis: Secondary | ICD-10-CM | POA: Diagnosis not present

## 2021-08-19 DIAGNOSIS — N186 End stage renal disease: Secondary | ICD-10-CM | POA: Diagnosis not present

## 2021-08-20 DIAGNOSIS — M545 Low back pain, unspecified: Secondary | ICD-10-CM | POA: Diagnosis not present

## 2021-08-20 DIAGNOSIS — M5459 Other low back pain: Secondary | ICD-10-CM | POA: Diagnosis not present

## 2021-08-20 DIAGNOSIS — N08 Glomerular disorders in diseases classified elsewhere: Secondary | ICD-10-CM | POA: Diagnosis not present

## 2021-08-20 DIAGNOSIS — Z8679 Personal history of other diseases of the circulatory system: Secondary | ICD-10-CM | POA: Diagnosis not present

## 2021-08-20 DIAGNOSIS — Z79891 Long term (current) use of opiate analgesic: Secondary | ICD-10-CM | POA: Diagnosis not present

## 2021-08-20 DIAGNOSIS — M25512 Pain in left shoulder: Secondary | ICD-10-CM | POA: Diagnosis not present

## 2021-08-20 DIAGNOSIS — M25569 Pain in unspecified knee: Secondary | ICD-10-CM | POA: Diagnosis not present

## 2021-08-21 DIAGNOSIS — Z992 Dependence on renal dialysis: Secondary | ICD-10-CM | POA: Diagnosis not present

## 2021-08-21 DIAGNOSIS — N186 End stage renal disease: Secondary | ICD-10-CM | POA: Diagnosis not present

## 2021-08-22 DIAGNOSIS — N186 End stage renal disease: Secondary | ICD-10-CM | POA: Diagnosis not present

## 2021-08-22 DIAGNOSIS — Z992 Dependence on renal dialysis: Secondary | ICD-10-CM | POA: Diagnosis not present

## 2021-08-24 DIAGNOSIS — N186 End stage renal disease: Secondary | ICD-10-CM | POA: Diagnosis not present

## 2021-08-24 DIAGNOSIS — Z992 Dependence on renal dialysis: Secondary | ICD-10-CM | POA: Diagnosis not present

## 2021-08-26 DIAGNOSIS — N186 End stage renal disease: Secondary | ICD-10-CM | POA: Diagnosis not present

## 2021-08-26 DIAGNOSIS — Z992 Dependence on renal dialysis: Secondary | ICD-10-CM | POA: Diagnosis not present

## 2021-08-28 DIAGNOSIS — Z992 Dependence on renal dialysis: Secondary | ICD-10-CM | POA: Diagnosis not present

## 2021-08-28 DIAGNOSIS — N186 End stage renal disease: Secondary | ICD-10-CM | POA: Diagnosis not present

## 2021-08-31 DIAGNOSIS — N186 End stage renal disease: Secondary | ICD-10-CM | POA: Diagnosis not present

## 2021-08-31 DIAGNOSIS — Z992 Dependence on renal dialysis: Secondary | ICD-10-CM | POA: Diagnosis not present

## 2021-09-02 DIAGNOSIS — N186 End stage renal disease: Secondary | ICD-10-CM | POA: Diagnosis not present

## 2021-09-02 DIAGNOSIS — Z992 Dependence on renal dialysis: Secondary | ICD-10-CM | POA: Diagnosis not present

## 2021-09-04 DIAGNOSIS — Z992 Dependence on renal dialysis: Secondary | ICD-10-CM | POA: Diagnosis not present

## 2021-09-04 DIAGNOSIS — N186 End stage renal disease: Secondary | ICD-10-CM | POA: Diagnosis not present

## 2021-09-06 DIAGNOSIS — N186 End stage renal disease: Secondary | ICD-10-CM | POA: Diagnosis not present

## 2021-09-06 DIAGNOSIS — Z992 Dependence on renal dialysis: Secondary | ICD-10-CM | POA: Diagnosis not present

## 2021-09-07 DIAGNOSIS — N186 End stage renal disease: Secondary | ICD-10-CM | POA: Diagnosis not present

## 2021-09-07 DIAGNOSIS — Z992 Dependence on renal dialysis: Secondary | ICD-10-CM | POA: Diagnosis not present

## 2021-09-09 DIAGNOSIS — Z992 Dependence on renal dialysis: Secondary | ICD-10-CM | POA: Diagnosis not present

## 2021-09-09 DIAGNOSIS — N186 End stage renal disease: Secondary | ICD-10-CM | POA: Diagnosis not present

## 2021-09-11 DIAGNOSIS — N186 End stage renal disease: Secondary | ICD-10-CM | POA: Diagnosis not present

## 2021-09-11 DIAGNOSIS — Z992 Dependence on renal dialysis: Secondary | ICD-10-CM | POA: Diagnosis not present

## 2021-09-14 DIAGNOSIS — N186 End stage renal disease: Secondary | ICD-10-CM | POA: Diagnosis not present

## 2021-09-14 DIAGNOSIS — Z992 Dependence on renal dialysis: Secondary | ICD-10-CM | POA: Diagnosis not present

## 2021-09-16 DIAGNOSIS — N186 End stage renal disease: Secondary | ICD-10-CM | POA: Diagnosis not present

## 2021-09-16 DIAGNOSIS — Z992 Dependence on renal dialysis: Secondary | ICD-10-CM | POA: Diagnosis not present

## 2021-09-18 DIAGNOSIS — Z992 Dependence on renal dialysis: Secondary | ICD-10-CM | POA: Diagnosis not present

## 2021-09-18 DIAGNOSIS — N186 End stage renal disease: Secondary | ICD-10-CM | POA: Diagnosis not present

## 2021-09-20 DIAGNOSIS — Z992 Dependence on renal dialysis: Secondary | ICD-10-CM | POA: Diagnosis not present

## 2021-09-20 DIAGNOSIS — N186 End stage renal disease: Secondary | ICD-10-CM | POA: Diagnosis not present

## 2021-09-21 DIAGNOSIS — Z992 Dependence on renal dialysis: Secondary | ICD-10-CM | POA: Diagnosis not present

## 2021-09-21 DIAGNOSIS — N186 End stage renal disease: Secondary | ICD-10-CM | POA: Diagnosis not present

## 2021-09-23 DIAGNOSIS — N186 End stage renal disease: Secondary | ICD-10-CM | POA: Diagnosis not present

## 2021-09-23 DIAGNOSIS — Z992 Dependence on renal dialysis: Secondary | ICD-10-CM | POA: Diagnosis not present

## 2021-09-25 ENCOUNTER — Telehealth: Payer: Self-pay | Admitting: Student

## 2021-09-25 DIAGNOSIS — Z992 Dependence on renal dialysis: Secondary | ICD-10-CM | POA: Diagnosis not present

## 2021-09-25 DIAGNOSIS — N186 End stage renal disease: Secondary | ICD-10-CM | POA: Diagnosis not present

## 2021-09-25 MED ORDER — DILTIAZEM HCL 30 MG PO TABS: ORAL_TABLET | ORAL | 6 refills | Status: DC

## 2021-09-25 NOTE — Telephone Encounter (Signed)
Explained that our office has never filled atorvastatin and we needed to verify dose. Unable to give dose but says PCP office usually refills atorvastatin and he had 2 refills and did not need atorvastatin refill.  ?

## 2021-09-25 NOTE — Telephone Encounter (Signed)
?*  STAT* If patient is at the pharmacy, call can be transferred to refill team. ? ? ?1. Which medications need to be refilled? (please list name of each medication and dose if known) Atorvastatin and Diltiazem ? ?2. Which pharmacy/location (including street and city if local pharmacy) is medication to be sent to?[place] ? ?3. Do they need a 30 day or 90 day supply? Enough until his appointment on  12-29-21 ? ?

## 2021-09-28 DIAGNOSIS — Z992 Dependence on renal dialysis: Secondary | ICD-10-CM | POA: Diagnosis not present

## 2021-09-28 DIAGNOSIS — N186 End stage renal disease: Secondary | ICD-10-CM | POA: Diagnosis not present

## 2021-09-30 DIAGNOSIS — Z992 Dependence on renal dialysis: Secondary | ICD-10-CM | POA: Diagnosis not present

## 2021-09-30 DIAGNOSIS — N186 End stage renal disease: Secondary | ICD-10-CM | POA: Diagnosis not present

## 2021-10-02 DIAGNOSIS — N186 End stage renal disease: Secondary | ICD-10-CM | POA: Diagnosis not present

## 2021-10-02 DIAGNOSIS — Z992 Dependence on renal dialysis: Secondary | ICD-10-CM | POA: Diagnosis not present

## 2021-10-05 DIAGNOSIS — N186 End stage renal disease: Secondary | ICD-10-CM | POA: Diagnosis not present

## 2021-10-05 DIAGNOSIS — Z992 Dependence on renal dialysis: Secondary | ICD-10-CM | POA: Diagnosis not present

## 2021-10-06 DIAGNOSIS — N186 End stage renal disease: Secondary | ICD-10-CM | POA: Diagnosis not present

## 2021-10-06 DIAGNOSIS — Z992 Dependence on renal dialysis: Secondary | ICD-10-CM | POA: Diagnosis not present

## 2021-10-07 DIAGNOSIS — N186 End stage renal disease: Secondary | ICD-10-CM | POA: Diagnosis not present

## 2021-10-07 DIAGNOSIS — Z992 Dependence on renal dialysis: Secondary | ICD-10-CM | POA: Diagnosis not present

## 2021-10-09 DIAGNOSIS — Z992 Dependence on renal dialysis: Secondary | ICD-10-CM | POA: Diagnosis not present

## 2021-10-09 DIAGNOSIS — N186 End stage renal disease: Secondary | ICD-10-CM | POA: Diagnosis not present

## 2021-10-12 DIAGNOSIS — N186 End stage renal disease: Secondary | ICD-10-CM | POA: Diagnosis not present

## 2021-10-12 DIAGNOSIS — Z992 Dependence on renal dialysis: Secondary | ICD-10-CM | POA: Diagnosis not present

## 2021-10-14 DIAGNOSIS — Z992 Dependence on renal dialysis: Secondary | ICD-10-CM | POA: Diagnosis not present

## 2021-10-14 DIAGNOSIS — N186 End stage renal disease: Secondary | ICD-10-CM | POA: Diagnosis not present

## 2021-10-15 ENCOUNTER — Ambulatory Visit (HOSPITAL_COMMUNITY)
Admission: RE | Admit: 2021-10-15 | Discharge: 2021-10-15 | Disposition: A | Discharge status: Home / Self Care | Payer: Medicare Other | Source: Ambulatory Visit | Attending: Family Medicine | Admitting: Family Medicine

## 2021-10-15 DIAGNOSIS — F17210 Nicotine dependence, cigarettes, uncomplicated: Secondary | ICD-10-CM | POA: Insufficient documentation

## 2021-10-15 DIAGNOSIS — Z122 Encounter for screening for malignant neoplasm of respiratory organs: Secondary | ICD-10-CM | POA: Diagnosis not present

## 2021-10-15 DIAGNOSIS — F17200 Nicotine dependence, unspecified, uncomplicated: Secondary | ICD-10-CM | POA: Insufficient documentation

## 2021-10-16 DIAGNOSIS — Z992 Dependence on renal dialysis: Secondary | ICD-10-CM | POA: Diagnosis not present

## 2021-10-16 DIAGNOSIS — N186 End stage renal disease: Secondary | ICD-10-CM | POA: Diagnosis not present

## 2021-10-19 DIAGNOSIS — N186 End stage renal disease: Secondary | ICD-10-CM | POA: Diagnosis not present

## 2021-10-19 DIAGNOSIS — Z992 Dependence on renal dialysis: Secondary | ICD-10-CM | POA: Diagnosis not present

## 2021-10-21 DIAGNOSIS — Z992 Dependence on renal dialysis: Secondary | ICD-10-CM | POA: Diagnosis not present

## 2021-10-21 DIAGNOSIS — N186 End stage renal disease: Secondary | ICD-10-CM | POA: Diagnosis not present

## 2021-10-22 DIAGNOSIS — Z992 Dependence on renal dialysis: Secondary | ICD-10-CM | POA: Diagnosis not present

## 2021-10-22 DIAGNOSIS — N186 End stage renal disease: Secondary | ICD-10-CM | POA: Diagnosis not present

## 2021-10-23 DIAGNOSIS — N186 End stage renal disease: Secondary | ICD-10-CM | POA: Diagnosis not present

## 2021-10-23 DIAGNOSIS — Z992 Dependence on renal dialysis: Secondary | ICD-10-CM | POA: Diagnosis not present

## 2021-10-26 DIAGNOSIS — N186 End stage renal disease: Secondary | ICD-10-CM | POA: Diagnosis not present

## 2021-10-26 DIAGNOSIS — Z992 Dependence on renal dialysis: Secondary | ICD-10-CM | POA: Diagnosis not present

## 2021-10-28 DIAGNOSIS — N186 End stage renal disease: Secondary | ICD-10-CM | POA: Diagnosis not present

## 2021-10-28 DIAGNOSIS — Z992 Dependence on renal dialysis: Secondary | ICD-10-CM | POA: Diagnosis not present

## 2021-10-30 DIAGNOSIS — Z992 Dependence on renal dialysis: Secondary | ICD-10-CM | POA: Diagnosis not present

## 2021-10-30 DIAGNOSIS — N186 End stage renal disease: Secondary | ICD-10-CM | POA: Diagnosis not present

## 2021-11-02 ENCOUNTER — Encounter (HOSPITAL_COMMUNITY): Payer: Self-pay

## 2021-11-02 ENCOUNTER — Emergency Department (HOSPITAL_COMMUNITY): Payer: Medicare Other

## 2021-11-02 ENCOUNTER — Emergency Department (HOSPITAL_COMMUNITY)
Admission: EM | Admit: 2021-11-02 | Discharge: 2021-11-02 | Disposition: A | Discharge status: Home / Self Care | Payer: Medicare Other | Attending: Emergency Medicine | Admitting: Emergency Medicine

## 2021-11-02 ENCOUNTER — Other Ambulatory Visit: Payer: Self-pay

## 2021-11-02 DIAGNOSIS — Z7982 Long term (current) use of aspirin: Secondary | ICD-10-CM | POA: Diagnosis not present

## 2021-11-02 DIAGNOSIS — I10 Essential (primary) hypertension: Secondary | ICD-10-CM | POA: Diagnosis not present

## 2021-11-02 DIAGNOSIS — I132 Hypertensive heart and chronic kidney disease with heart failure and with stage 5 chronic kidney disease, or end stage renal disease: Secondary | ICD-10-CM | POA: Diagnosis not present

## 2021-11-02 DIAGNOSIS — I509 Heart failure, unspecified: Secondary | ICD-10-CM | POA: Insufficient documentation

## 2021-11-02 DIAGNOSIS — J449 Chronic obstructive pulmonary disease, unspecified: Secondary | ICD-10-CM | POA: Insufficient documentation

## 2021-11-02 DIAGNOSIS — N186 End stage renal disease: Secondary | ICD-10-CM | POA: Diagnosis not present

## 2021-11-02 DIAGNOSIS — I471 Supraventricular tachycardia: Secondary | ICD-10-CM | POA: Diagnosis not present

## 2021-11-02 DIAGNOSIS — R000 Tachycardia, unspecified: Secondary | ICD-10-CM | POA: Diagnosis not present

## 2021-11-02 DIAGNOSIS — Z992 Dependence on renal dialysis: Secondary | ICD-10-CM | POA: Diagnosis not present

## 2021-11-02 DIAGNOSIS — Z7951 Long term (current) use of inhaled steroids: Secondary | ICD-10-CM | POA: Insufficient documentation

## 2021-11-02 DIAGNOSIS — Z79899 Other long term (current) drug therapy: Secondary | ICD-10-CM | POA: Diagnosis not present

## 2021-11-02 DIAGNOSIS — Z743 Need for continuous supervision: Secondary | ICD-10-CM | POA: Diagnosis not present

## 2021-11-02 DIAGNOSIS — R0689 Other abnormalities of breathing: Secondary | ICD-10-CM | POA: Diagnosis not present

## 2021-11-02 DIAGNOSIS — R0789 Other chest pain: Secondary | ICD-10-CM | POA: Diagnosis not present

## 2021-11-02 DIAGNOSIS — I499 Cardiac arrhythmia, unspecified: Secondary | ICD-10-CM | POA: Diagnosis not present

## 2021-11-02 LAB — COMPREHENSIVE METABOLIC PANEL
ALT: 14 U/L (ref 0–44)
AST: 23 U/L (ref 15–41)
Albumin: 3.6 g/dL (ref 3.5–5.0)
Alkaline Phosphatase: 95 U/L (ref 38–126)
Anion gap: 12 (ref 5–15)
BUN: 39 mg/dL — ABNORMAL HIGH (ref 6–20)
CO2: 26 mmol/L (ref 22–32)
Calcium: 8 mg/dL — ABNORMAL LOW (ref 8.9–10.3)
Chloride: 99 mmol/L (ref 98–111)
Creatinine, Ser: 10.46 mg/dL — ABNORMAL HIGH (ref 0.61–1.24)
GFR, Estimated: 5 mL/min — ABNORMAL LOW (ref >60)
Glucose, Bld: 116 mg/dL — ABNORMAL HIGH (ref 70–99)
Potassium: 4.1 mmol/L (ref 3.5–5.1)
Sodium: 137 mmol/L (ref 135–145)
Total Bilirubin: 0.7 mg/dL (ref 0.3–1.2)
Total Protein: 8 g/dL (ref 6.5–8.1)

## 2021-11-02 LAB — CBC
HCT: 49.6 % (ref 39.0–52.0)
Hemoglobin: 15.5 g/dL (ref 13.0–17.0)
MCH: 29.5 pg (ref 26.0–34.0)
MCHC: 31.3 g/dL (ref 30.0–36.0)
MCV: 94.5 fL (ref 80.0–100.0)
Platelets: 223 10*3/uL (ref 150–400)
RBC: 5.25 MIL/uL (ref 4.22–5.81)
RDW: 15.3 % (ref 11.5–15.5)
WBC: 9.8 10*3/uL (ref 4.0–10.5)
nRBC: 0 % (ref 0.0–0.2)

## 2021-11-02 LAB — MAGNESIUM: Magnesium: 2.4 mg/dL (ref 1.7–2.4)

## 2021-11-02 MED ORDER — ADENOSINE 6 MG/2ML IV SOLN
12.0000 mg | Freq: Once | INTRAVENOUS | Status: AC | PRN
  Administered 2021-11-02: 12 mg via INTRAVENOUS

## 2021-11-02 MED ORDER — ADENOSINE 6 MG/2ML IV SOLN
INTRAVENOUS | Status: AC
Start: 2021-11-02 — End: 2021-11-02
  Administered 2021-11-02: 6 mg
  Filled 2021-11-02: qty 6

## 2021-11-02 NOTE — ED Provider Notes (Signed)
[facility] Provider Note   CSN: [id number] Arrival date & time: 11/02/21  0740     History  Chief Complaint  Patient presents with   Irregular Heart Beat    Brian Barrera is a 51 y.o. male.  HPI Patient presents from dialysis center for tightness in chest.  He was undergoing dialysis session when he experienced the symptoms.  He was found to have a rapid heart rate.  He does have a history of SVT.  He has undergone 3 visits to the ED where his SVT was resolved with adenosine.  He takes Cardizem daily.  He did take his dose this morning and took an extra dose following the onset of his rapid heart rate.  Additional medical history includes anemia, HTN, COPD, CHF, arthritis.  Currently patient endorses continued tightness in chest but denies any other symptoms.    Home Medications Prior to Admission medications   Medication Sig Start Date End Date Taking? Authorizing Provider  albuterol (ACCUNEB) 1.25 MG/3ML nebulizer solution Take 1 ampule by nebulization as needed for wheezing or shortness of breath.   Yes [provider]  albuterol (PROAIR HFA) 108 (90 BASE) MCG/ACT inhaler Inhale 2 puffs into the lungs every 4 (four) hours as needed for wheezing. 02/11/14  Yes [place], Modena Nunnery, MD  aspirin EC 81 MG tablet Take 81 mg by mouth daily.   Yes [provider]  atorvastatin (LIPITOR) 10 MG tablet Take 10 mg by mouth daily.   Yes [provider]  AURYXIA 1 GM 210 MG(Fe) tablet Take 420 mg by mouth 3 (three) times daily with meals.  11/02/16  Yes [provider]  b complex-vitamin c-folic acid (NEPHRO-VITE) 0.8 MG TABS Take 0.8 mg by mouth daily.    Yes [provider]  cinacalcet (SENSIPAR) 60 MG tablet Take 60 mg by mouth daily.   Yes [provider]  diltiazem (CARDIZEM) 30 MG tablet Take 2 tabs ('60mg'$ ) by mouth every morning only on dialysis days - may take an extra 1 tab ('30mg'$ ) in the middle of dialysis as  needed for rapid heart rate. 09/25/21  Yes [place], Alphonse Guild, MD  diphenhydramine-acetaminophen (TYLENOL PM) 25-500 MG TABS tablet Take 1 tablet by mouth at bedtime as needed (sleep).   Yes [provider]  DULoxetine (CYMBALTA) 30 MG capsule Take 30 mg by mouth daily. 11/08/19  Yes [provider]  gabapentin (NEURONTIN) 300 MG capsule Take 300 mg by mouth daily. 10/09/19  Yes [provider]  Omega-3 1000 MG CAPS Take 2,000 mg by mouth daily.   Yes [provider]  oxyCODONE-acetaminophen (PERCOCET) 7.5-325 MG tablet Take 1 tablet by mouth every 6 (six) hours as needed for moderate pain. 10/09/19  Yes [provider]  sevelamer carbonate (RENVELA) 800 MG tablet Take 1,600-3,200 mg by mouth See admin instructions. Take 3200 mg with meals and 1600 with snack   Yes [provider]  budesonide-formoterol (SYMBICORT) 80-4.5 MCG/ACT inhaler Inhale 2 puffs into the lungs 2 (two) times daily. Patient not taking: Reported on 11/02/2021 06/05/13   [name], MD      Allergies    Patient has no known allergies.    Review of Systems   Review of Systems  Respiratory:  Positive for chest tightness.   Cardiovascular:  Positive for palpitations.  All other systems reviewed and are negative.   Physical Exam Updated Vital Signs BP (!) 130/93   Pulse 76   Temp 98.6 F (  37 C) (Oral)   Resp 11   Ht 6' (1.829 m)   Wt 117.9 kg   SpO2 97%   BMI 35.26 kg/m  Physical Exam Vitals and nursing note reviewed.  Constitutional:      General: He is not in acute distress.    Appearance: Normal appearance. He is well-developed. He is not ill-appearing, toxic-appearing or diaphoretic.  HENT:     Head: Normocephalic and atraumatic.     Right Ear: External ear normal.     Left Ear: External ear normal.     Nose: Nose normal.     Mouth/Throat:     Mouth: Mucous membranes are moist.     Pharynx: Oropharynx is clear.  Eyes:     Extraocular Movements:  Extraocular movements intact.     Conjunctiva/sclera: Conjunctivae normal.  Cardiovascular:     Rate and Rhythm: Regular rhythm. Tachycardia present.     Heart sounds: No murmur heard. Pulmonary:     Effort: Pulmonary effort is normal. No respiratory distress.     Breath sounds: Normal breath sounds. No wheezing or rales.  Chest:     Chest wall: No tenderness.  Abdominal:     Palpations: Abdomen is soft.     Tenderness: There is no abdominal tenderness.  Musculoskeletal:        General: No swelling. Normal range of motion.     Cervical back: Normal range of motion and neck supple.     Right lower leg: No edema.     Left lower leg: No edema.  Skin:    General: Skin is warm and dry.     Coloration: Skin is not jaundiced or pale.  Neurological:     General: No focal deficit present.     Mental Status: He is alert and oriented to person, place, and time.     Cranial Nerves: No cranial nerve deficit.     Sensory: No sensory deficit.     Motor: No weakness.     Coordination: Coordination normal.  Psychiatric:        Mood and Affect: Mood normal.        Behavior: Behavior normal.        Thought Content: Thought content normal.        Judgment: Judgment normal.     ED Results / Procedures / Treatments   Labs (all labs ordered are listed, but only abnormal results are displayed) Labs Reviewed  COMPREHENSIVE METABOLIC PANEL - Abnormal; Notable for the following components:      Result Value   Glucose, Bld 116 (*)    BUN 39 (*)    Creatinine, Ser 10.46 (*)    Calcium 8.0 (*)    GFR, Estimated 5 (*)    All other components within normal limits  CBC  MAGNESIUM    EKG EKG Interpretation  Date/Time:  Monday November 02 2021 08:07:20 EDT Ventricular Rate:  111 PR Interval:  172 QRS Duration: 81 QT Interval:  341 QTC Calculation: 464 R Axis:   16 Text Interpretation: Sinus tachycardia Biatrial enlargement Nonspecific T abnormalities, lateral leads Confirmed by Godfrey Pick  [phone]) on 11/02/2021 9:26:32 AM  Radiology DG Chest Portable 1 View  Result Date: 11/02/2021 CLINICAL DATA:  Provided history: Elevated heart rate, chest pressure, hypertension, smoker. EXAM: PORTABLE CHEST 1 VIEW COMPARISON:  Chest CT 10/15/2021.  Chest radiograph 09/29/2020. FINDINGS: Cardiac monitoring pads overlie the chest. Heart size within normal limits. Aortic atherosclerosis no appreciable airspace consolidation or pulmonary edema. A  small amount of fluid is questioned within the minor fissure. A portion of the left lateral costophrenic angle is excluded from the field of view. Within this limitation, there is no evidence of pleural effusion. No evidence of pneumothorax. No evidence of acute bony abnormality. IMPRESSION: A portion of the left lateral costophrenic angle is excluded from the field of view. No appreciable airspace consolidation or pulmonary edema. A small amount of fluid may be present within the minor fissure. Aortic Atherosclerosis (ICD10-I70.0). Electronically Signed   By: Kellie Simmering D.O.   On: 11/02/2021 08:34    Procedures .Cardioversion  Date/Time: 11/02/2021 8:08 AM  Performed by: Godfrey Pick, MD Authorized by: Godfrey Pick, MD   Consent:    Consent obtained:  Verbal   Consent given by:  Patient Universal protocol:    Patient identity confirmed:  Verbally with patient Pre-procedure details:    Cardioversion basis:  Elective   Rhythm:  Supraventricular tachycardia   Electrode placement:  Anterior-posterior Patient sedated: No Attempt one:    Cardioversion mode attempt one: Adenosine cardioversion. Post-procedure details:    Patient status:  Awake   Patient tolerance of procedure:  Tolerated well, no immediate complications     Medications Ordered in ED Medications  adenosine (ADENOCARD) 6 MG/2ML injection (6 mg  Given 11/02/21 0755)  adenosine (ADENOCARD) 6 MG/2ML injection 12 mg (12 mg Intravenous Given 11/02/21 0805)    ED Course/ Medical Decision  Making/ A&P                           Medical Decision Making Amount and/or Complexity of Data Reviewed Labs: ordered. Radiology: ordered.  Risk Prescription drug management.   This patient presents to the ED for concern of chest tightness and tachycardia, this involves an extensive number of treatment options, and is a complaint that carries with it a high risk of complications and morbidity.  The differential diagnosis includes SVT, atrial fibrillation, atrial flutter, sinus tachycardia,   Co morbidities that complicate the patient evaluation  ESRD, anemia, HTN, COPD, CHF   Additional history obtained:  Additional history obtained from N/A External records from outside source obtained and reviewed including EMR   Lab Tests:  I Ordered, and personally interpreted labs.  The pertinent results include: Normal electrolytes, normal hemoglobin   Imaging Studies ordered:  I ordered imaging studies including chest x-ray I independently visualized and interpreted imaging which showed no acute findings I agree with the radiologist interpretation   Cardiac Monitoring: / EKG:  The patient was maintained on a cardiac monitor.  I personally viewed and interpreted the cardiac monitored which showed an underlying rhythm of: SVT converted to sinus rhythm   Problem List / ED Course / Critical interventions / Medication management  Patient presents for chest tightness and tachycardia.  This was identified while at his hemodialysis session this morning.  While patient was undergoing dialysis, he developed chest tightness.  He was found to have a narrow complex regular rhythm tachycardia.  He was transported to the ED.  On arrival in the ED, his tachycardia persists.  Heart rate is approximately 180.  Rhythm is regular.  On EKG, QRS is narrow.  Presentation is consistent with SVT, which patient has had before.  Patient was placed on bedside cardiac monitor.  ZOLL monitor and pads were  placed on patient.  Laboratory work-up was initiated.  Patient received a 6 mg dose of adenosine.  This was given through a distal IV  in his hand without any effect.  A more proximal IV access was established by myself under ultrasound guidance.  Patient was then given a 12 mg dose of adenosine through proximal PIV with subsequent conversion to normal sinus rhythm.  Following conversion, patient had resolution of symptoms.  On lab work, patient has normal electrolytes.  He was kept on bedside cardiac monitor without any recurrence of tachycardia.  He remained asymptomatic.  Patient reports that he was able to undergo 2/4 hours of his dialysis session today.  He will be able to call his dialysis center to see if he can have a make-up session tomorrow.  He will otherwise resume his normal dialysis schedule on Wednesday. I ordered medication including adenosine for SVT Reevaluation of the patient after these medicines showed that the patient resolved I have reviewed the patients home medicines and have made adjustments as needed   Social Determinants of Health:  Has access to outpatient care  CRITICAL CARE Performed by: Godfrey Pick   Total critical care time: 35 minutes  Critical care time was exclusive of separately billable procedures and treating other patients.  Critical care was necessary to treat or prevent imminent or life-threatening deterioration.  Critical care was time spent personally by me on the following activities: development of treatment plan with patient and/or surrogate as well as nursing, discussions with consultants, evaluation of patient's response to treatment, examination of patient, obtaining history from patient or surrogate, ordering and performing treatments and interventions, ordering and review of laboratory studies, ordering and review of radiographic studies, pulse oximetry and re-evaluation of patient's condition.         Final Clinical Impression(s) / ED  Diagnoses Final diagnoses:  SVT (supraventricular tachycardia) ([place])    Rx / DC Orders ED Discharge Orders          Ordered    Ambulatory referral to Cardiology       Comments: If you have not heard from the Cardiology office within the next 72 hours please call [phone].   11/02/21 1138              Godfrey Pick, MD 11/02/21 1756

## 2021-11-02 NOTE — ED Notes (Signed)
MD in room at time of triage for MSE. 

## 2021-11-02 NOTE — Discharge Instructions (Signed)
Continue to take your home medications as prescribed.  Follow-up with cardiology.  Return to the emergency department for any recurrence of symptoms.

## 2021-11-02 NOTE — ED Triage Notes (Signed)
Patient at dialysis when then he started to feel pressure in chest. Noted to be in SVT. Denies any other symptoms. HX of same.

## 2021-11-04 DIAGNOSIS — N186 End stage renal disease: Secondary | ICD-10-CM | POA: Diagnosis not present

## 2021-11-04 DIAGNOSIS — Z992 Dependence on renal dialysis: Secondary | ICD-10-CM | POA: Diagnosis not present

## 2021-11-05 DIAGNOSIS — N186 End stage renal disease: Secondary | ICD-10-CM | POA: Diagnosis not present

## 2021-11-05 DIAGNOSIS — Z992 Dependence on renal dialysis: Secondary | ICD-10-CM | POA: Diagnosis not present

## 2021-11-06 DIAGNOSIS — N186 End stage renal disease: Secondary | ICD-10-CM | POA: Diagnosis not present

## 2021-11-06 DIAGNOSIS — Z992 Dependence on renal dialysis: Secondary | ICD-10-CM | POA: Diagnosis not present

## 2021-11-09 DIAGNOSIS — N186 End stage renal disease: Secondary | ICD-10-CM | POA: Diagnosis not present

## 2021-11-09 DIAGNOSIS — Z992 Dependence on renal dialysis: Secondary | ICD-10-CM | POA: Diagnosis not present

## 2021-11-11 DIAGNOSIS — Z992 Dependence on renal dialysis: Secondary | ICD-10-CM | POA: Diagnosis not present

## 2021-11-11 DIAGNOSIS — N186 End stage renal disease: Secondary | ICD-10-CM | POA: Diagnosis not present

## 2021-11-12 DIAGNOSIS — M25569 Pain in unspecified knee: Secondary | ICD-10-CM | POA: Diagnosis not present

## 2021-11-12 DIAGNOSIS — M545 Low back pain, unspecified: Secondary | ICD-10-CM | POA: Diagnosis not present

## 2021-11-12 DIAGNOSIS — Z79891 Long term (current) use of opiate analgesic: Secondary | ICD-10-CM | POA: Diagnosis not present

## 2021-11-12 DIAGNOSIS — Z8679 Personal history of other diseases of the circulatory system: Secondary | ICD-10-CM | POA: Diagnosis not present

## 2021-11-12 DIAGNOSIS — M25512 Pain in left shoulder: Secondary | ICD-10-CM | POA: Diagnosis not present

## 2021-11-13 ENCOUNTER — Encounter (HOSPITAL_COMMUNITY): Payer: Self-pay | Admitting: Emergency Medicine

## 2021-11-13 ENCOUNTER — Emergency Department (HOSPITAL_COMMUNITY)
Admission: EM | Admit: 2021-11-13 | Discharge: 2021-11-13 | Disposition: A | Discharge status: Home / Self Care | Payer: Medicare Other | Attending: Emergency Medicine | Admitting: Emergency Medicine

## 2021-11-13 ENCOUNTER — Other Ambulatory Visit: Payer: Self-pay

## 2021-11-13 DIAGNOSIS — Z992 Dependence on renal dialysis: Secondary | ICD-10-CM | POA: Insufficient documentation

## 2021-11-13 DIAGNOSIS — I5032 Chronic diastolic (congestive) heart failure: Secondary | ICD-10-CM | POA: Insufficient documentation

## 2021-11-13 DIAGNOSIS — Z743 Need for continuous supervision: Secondary | ICD-10-CM | POA: Diagnosis not present

## 2021-11-13 DIAGNOSIS — D631 Anemia in chronic kidney disease: Secondary | ICD-10-CM | POA: Diagnosis not present

## 2021-11-13 DIAGNOSIS — J449 Chronic obstructive pulmonary disease, unspecified: Secondary | ICD-10-CM | POA: Insufficient documentation

## 2021-11-13 DIAGNOSIS — I132 Hypertensive heart and chronic kidney disease with heart failure and with stage 5 chronic kidney disease, or end stage renal disease: Secondary | ICD-10-CM | POA: Diagnosis not present

## 2021-11-13 DIAGNOSIS — N186 End stage renal disease: Secondary | ICD-10-CM | POA: Insufficient documentation

## 2021-11-13 DIAGNOSIS — R6889 Other general symptoms and signs: Secondary | ICD-10-CM | POA: Diagnosis not present

## 2021-11-13 DIAGNOSIS — I471 Supraventricular tachycardia: Secondary | ICD-10-CM | POA: Diagnosis not present

## 2021-11-13 DIAGNOSIS — Z7982 Long term (current) use of aspirin: Secondary | ICD-10-CM | POA: Insufficient documentation

## 2021-11-13 DIAGNOSIS — R000 Tachycardia, unspecified: Secondary | ICD-10-CM | POA: Diagnosis not present

## 2021-11-13 DIAGNOSIS — I499 Cardiac arrhythmia, unspecified: Secondary | ICD-10-CM | POA: Diagnosis not present

## 2021-11-13 LAB — BASIC METABOLIC PANEL
Anion gap: 13 (ref 5–15)
BUN: 26 mg/dL — ABNORMAL HIGH (ref 6–20)
CO2: 27 mmol/L (ref 22–32)
Calcium: 9.1 mg/dL (ref 8.9–10.3)
Chloride: 98 mmol/L (ref 98–111)
Creatinine, Ser: 6.61 mg/dL — ABNORMAL HIGH (ref 0.61–1.24)
GFR, Estimated: 9 mL/min — ABNORMAL LOW (ref >60)
Glucose, Bld: 126 mg/dL — ABNORMAL HIGH (ref 70–99)
Potassium: 3.5 mmol/L (ref 3.5–5.1)
Sodium: 138 mmol/L (ref 135–145)

## 2021-11-13 LAB — CBC WITH DIFFERENTIAL/PLATELET
Abs Immature Granulocytes: 0.01 10*3/uL (ref 0.00–0.07)
Basophils Absolute: 0.1 10*3/uL (ref 0.0–0.1)
Basophils Relative: 1 %
Eosinophils Absolute: 0.2 10*3/uL (ref 0.0–0.5)
Eosinophils Relative: 3 %
HCT: 52.7 % — ABNORMAL HIGH (ref 39.0–52.0)
Hemoglobin: 16.3 g/dL (ref 13.0–17.0)
Immature Granulocytes: 0 %
Lymphocytes Relative: 26 %
Lymphs Abs: 2.3 10*3/uL (ref 0.7–4.0)
MCH: 29.1 pg (ref 26.0–34.0)
MCHC: 30.9 g/dL (ref 30.0–36.0)
MCV: 93.9 fL (ref 80.0–100.0)
Monocytes Absolute: 0.8 10*3/uL (ref 0.1–1.0)
Monocytes Relative: 9 %
Neutro Abs: 5.3 10*3/uL (ref 1.7–7.7)
Neutrophils Relative %: 61 %
Platelets: 233 10*3/uL (ref 150–400)
RBC: 5.61 MIL/uL (ref 4.22–5.81)
RDW: 16.1 % — ABNORMAL HIGH (ref 11.5–15.5)
WBC: 8.6 10*3/uL (ref 4.0–10.5)
nRBC: 0 % (ref 0.0–0.2)

## 2021-11-13 LAB — MAGNESIUM: Magnesium: 2.1 mg/dL (ref 1.7–2.4)

## 2021-11-13 MED ORDER — ADENOSINE 6 MG/2ML IV SOLN
INTRAVENOUS | Status: AC
  Filled 2021-11-13: qty 2

## 2021-11-13 MED ORDER — ADENOSINE 6 MG/2ML IV SOLN
12.0000 mg | Freq: Once | INTRAVENOUS | Status: AC
  Administered 2021-11-13: 12 mg via INTRAVENOUS

## 2021-11-13 NOTE — ED Triage Notes (Signed)
Pt BIB [facility] with reports of "rapid heart rate." Pt noted to be in SVT with HR of 197.

## 2021-11-16 DIAGNOSIS — N186 End stage renal disease: Secondary | ICD-10-CM | POA: Diagnosis not present

## 2021-11-16 DIAGNOSIS — Z992 Dependence on renal dialysis: Secondary | ICD-10-CM | POA: Diagnosis not present

## 2021-11-18 DIAGNOSIS — Z992 Dependence on renal dialysis: Secondary | ICD-10-CM | POA: Diagnosis not present

## 2021-11-18 DIAGNOSIS — N186 End stage renal disease: Secondary | ICD-10-CM | POA: Diagnosis not present

## 2021-11-20 DIAGNOSIS — Z992 Dependence on renal dialysis: Secondary | ICD-10-CM | POA: Diagnosis not present

## 2021-11-20 DIAGNOSIS — N186 End stage renal disease: Secondary | ICD-10-CM | POA: Diagnosis not present

## 2021-11-21 DIAGNOSIS — Z992 Dependence on renal dialysis: Secondary | ICD-10-CM | POA: Diagnosis not present

## 2021-11-21 DIAGNOSIS — N186 End stage renal disease: Secondary | ICD-10-CM | POA: Diagnosis not present

## 2021-11-23 DIAGNOSIS — Z992 Dependence on renal dialysis: Secondary | ICD-10-CM | POA: Diagnosis not present

## 2021-11-23 DIAGNOSIS — N186 End stage renal disease: Secondary | ICD-10-CM | POA: Diagnosis not present

## 2021-11-25 DIAGNOSIS — Z992 Dependence on renal dialysis: Secondary | ICD-10-CM | POA: Diagnosis not present

## 2021-11-25 DIAGNOSIS — N186 End stage renal disease: Secondary | ICD-10-CM | POA: Diagnosis not present

## 2021-11-27 DIAGNOSIS — N186 End stage renal disease: Secondary | ICD-10-CM | POA: Diagnosis not present

## 2021-11-27 DIAGNOSIS — Z992 Dependence on renal dialysis: Secondary | ICD-10-CM | POA: Diagnosis not present

## 2021-11-29 IMAGING — DX DG CERVICAL SPINE COMPLETE 4+V
6 series · 6 of 6 positions shown · non-contrast
Comparison: None.

CLINICAL DATA: Pt c/o pain in neck with radiation into Lt shoulder
and pain across lower back radiating into Lt lower leg x 2 months.
No recent injury

EXAM:
CERVICAL SPINE - COMPLETE 4+ VIEW

[c-spine lat]
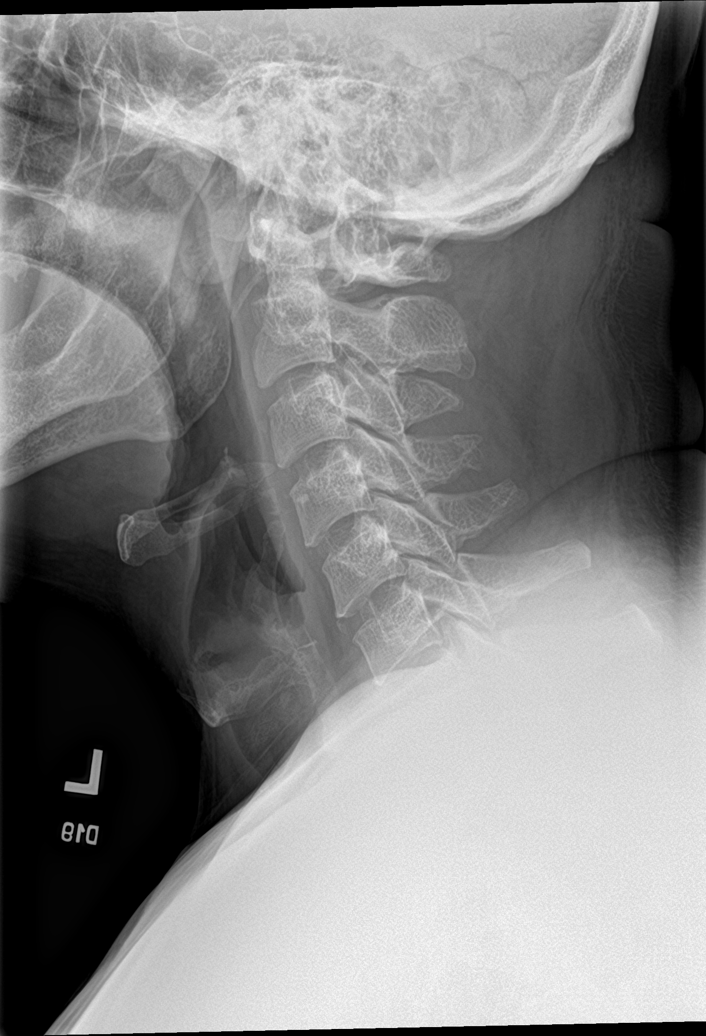

[c-spine obl (1 of 2)]
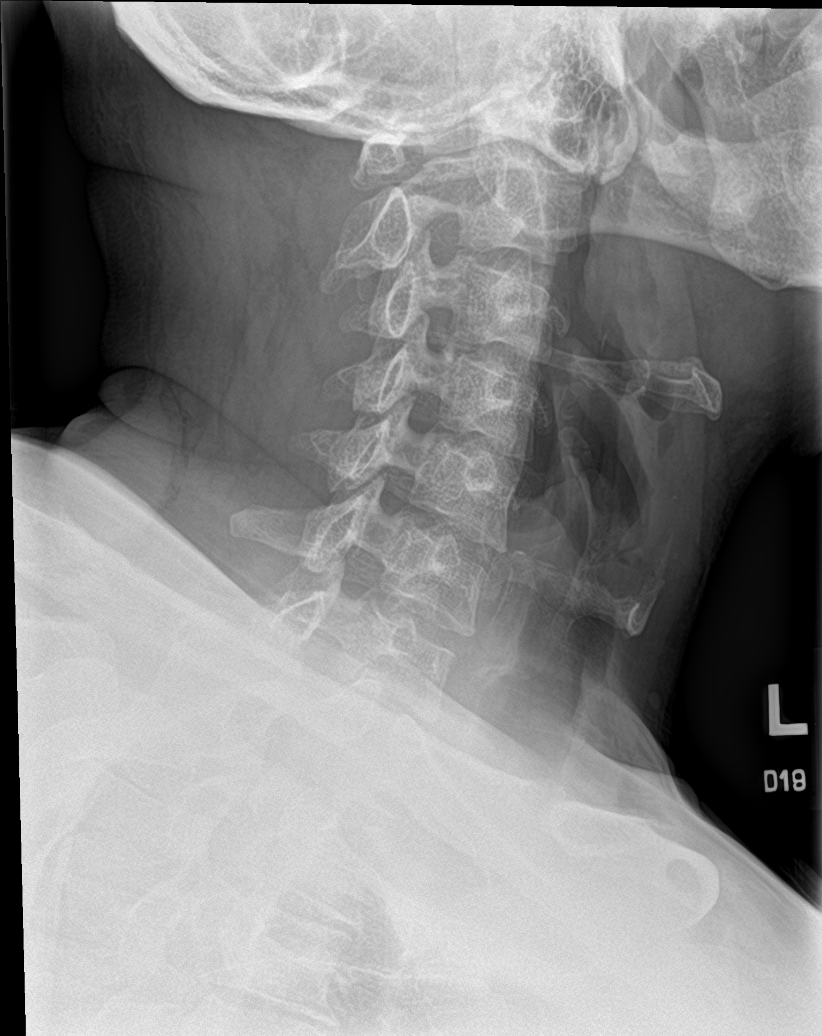

[c-spine obl (2 of 2)]
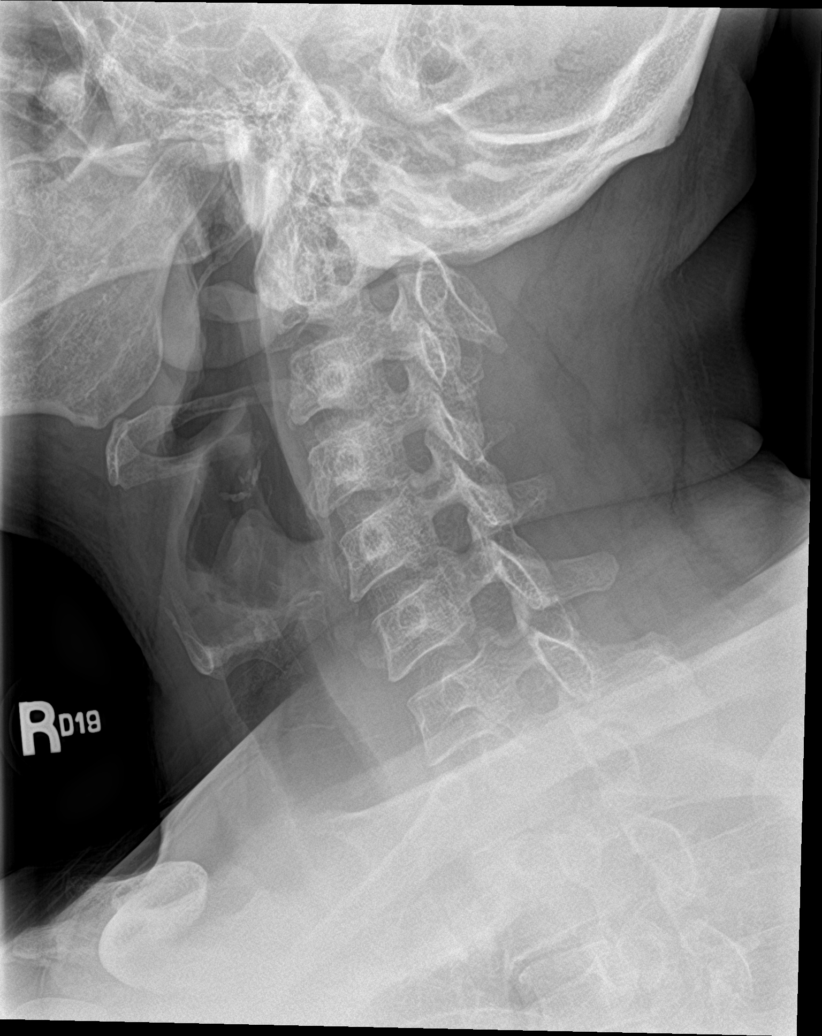

[c-spine ap]
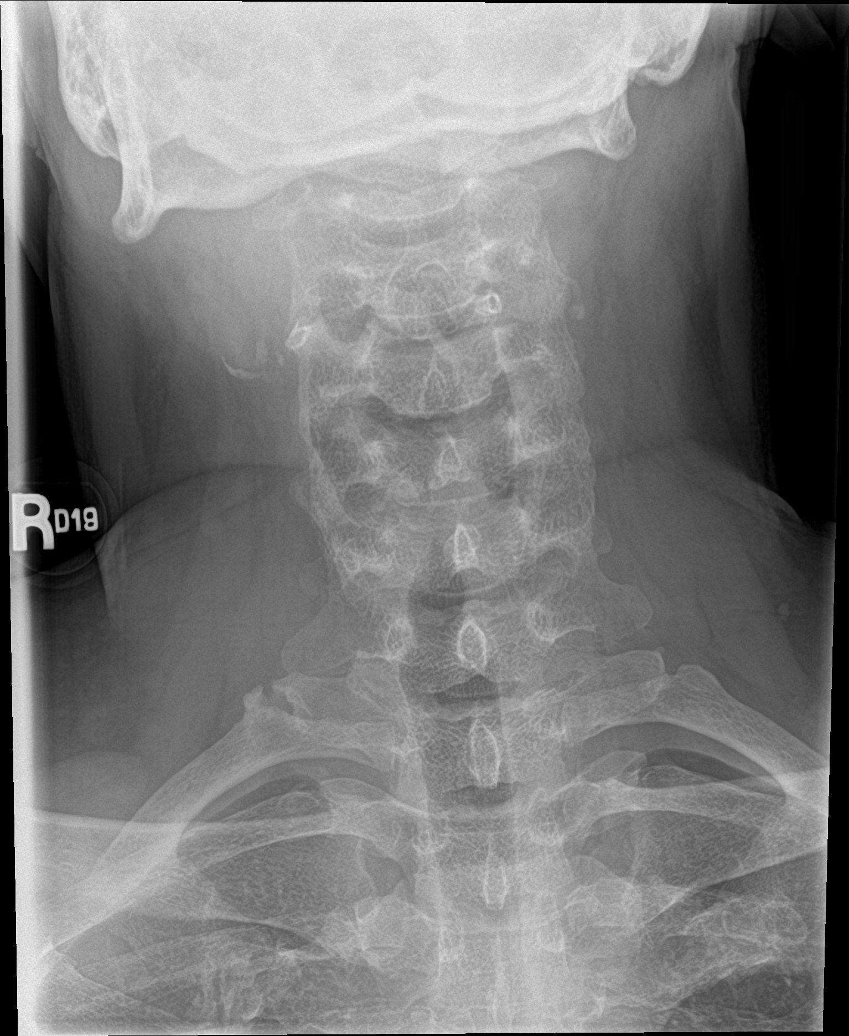

[c-spine open mouth]
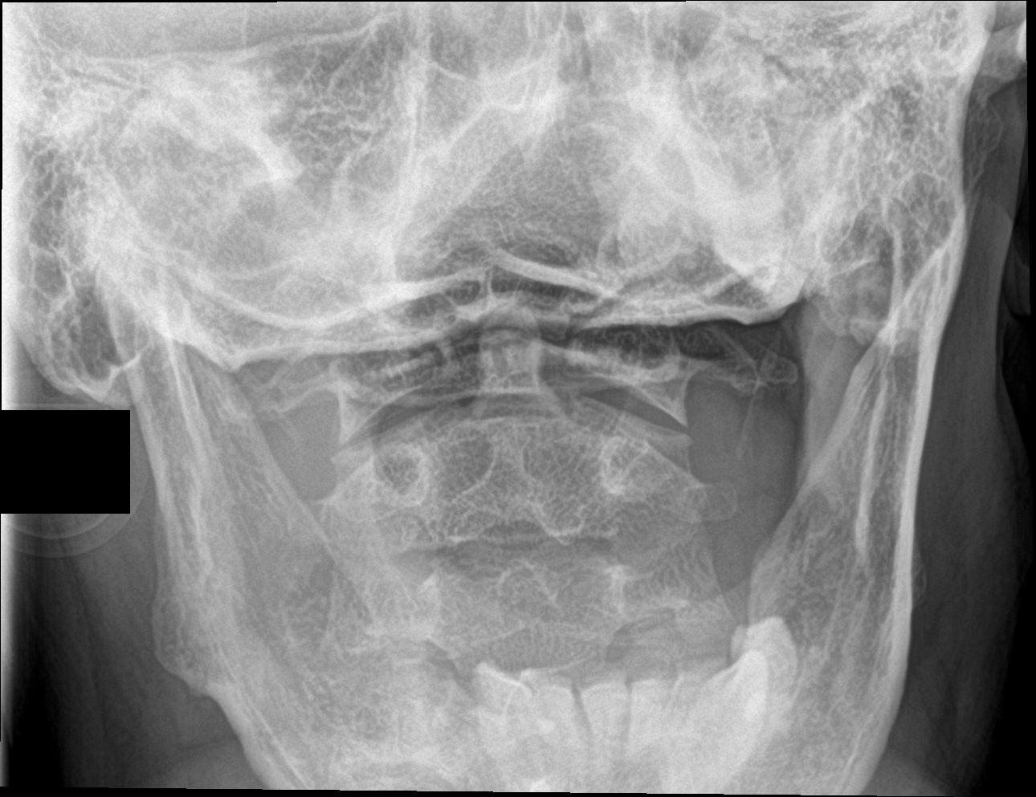

[c-spine swimmers trauma]
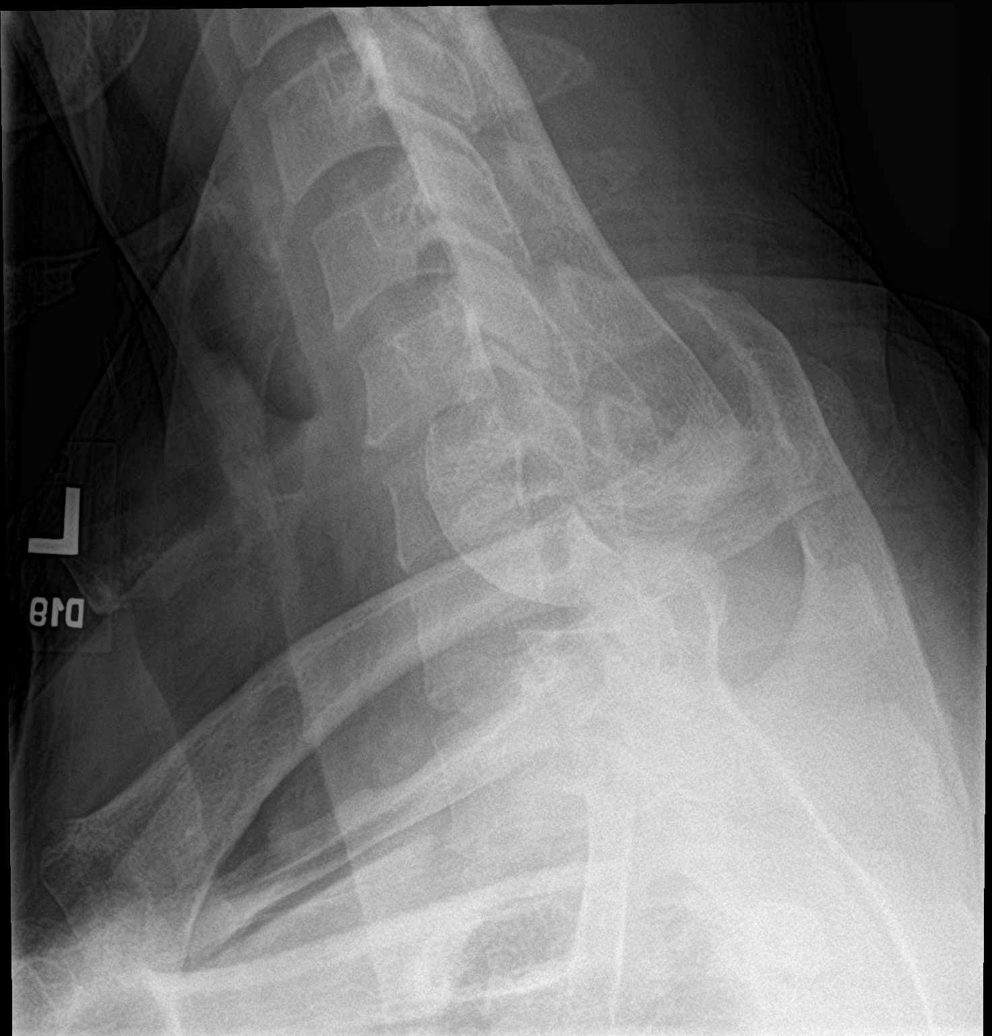

[6 of 6 positions shown; findings below may reference images not displayed]

FINDINGS: There is no evidence of cervical spine fracture or prevertebral soft
tissue swelling. Alignment is normal. No other significant bone
abnormalities are identified.
IMPRESSION: Negative cervical spine radiographs.

## 2021-11-30 DIAGNOSIS — N186 End stage renal disease: Secondary | ICD-10-CM | POA: Diagnosis not present

## 2021-11-30 DIAGNOSIS — Z992 Dependence on renal dialysis: Secondary | ICD-10-CM | POA: Diagnosis not present

## 2021-12-02 DIAGNOSIS — Z992 Dependence on renal dialysis: Secondary | ICD-10-CM | POA: Diagnosis not present

## 2021-12-02 DIAGNOSIS — N186 End stage renal disease: Secondary | ICD-10-CM | POA: Diagnosis not present

## 2021-12-04 DIAGNOSIS — N186 End stage renal disease: Secondary | ICD-10-CM | POA: Diagnosis not present

## 2021-12-04 DIAGNOSIS — Z992 Dependence on renal dialysis: Secondary | ICD-10-CM | POA: Diagnosis not present

## 2021-12-05 DIAGNOSIS — N186 End stage renal disease: Secondary | ICD-10-CM | POA: Diagnosis not present

## 2021-12-05 DIAGNOSIS — Z992 Dependence on renal dialysis: Secondary | ICD-10-CM | POA: Diagnosis not present

## 2021-12-07 DIAGNOSIS — Z992 Dependence on renal dialysis: Secondary | ICD-10-CM | POA: Diagnosis not present

## 2021-12-07 DIAGNOSIS — N186 End stage renal disease: Secondary | ICD-10-CM | POA: Diagnosis not present

## 2021-12-09 DIAGNOSIS — N186 End stage renal disease: Secondary | ICD-10-CM | POA: Diagnosis not present

## 2021-12-09 DIAGNOSIS — Z992 Dependence on renal dialysis: Secondary | ICD-10-CM | POA: Diagnosis not present

## 2021-12-11 DIAGNOSIS — N186 End stage renal disease: Secondary | ICD-10-CM | POA: Diagnosis not present

## 2021-12-11 DIAGNOSIS — Z992 Dependence on renal dialysis: Secondary | ICD-10-CM | POA: Diagnosis not present

## 2021-12-14 DIAGNOSIS — Z992 Dependence on renal dialysis: Secondary | ICD-10-CM | POA: Diagnosis not present

## 2021-12-14 DIAGNOSIS — N186 End stage renal disease: Secondary | ICD-10-CM | POA: Diagnosis not present

## 2021-12-16 DIAGNOSIS — N186 End stage renal disease: Secondary | ICD-10-CM | POA: Diagnosis not present

## 2021-12-16 DIAGNOSIS — Z992 Dependence on renal dialysis: Secondary | ICD-10-CM | POA: Diagnosis not present

## 2021-12-18 DIAGNOSIS — Z992 Dependence on renal dialysis: Secondary | ICD-10-CM | POA: Diagnosis not present

## 2021-12-18 DIAGNOSIS — N186 End stage renal disease: Secondary | ICD-10-CM | POA: Diagnosis not present

## 2021-12-21 DIAGNOSIS — N186 End stage renal disease: Secondary | ICD-10-CM | POA: Diagnosis not present

## 2021-12-21 DIAGNOSIS — Z992 Dependence on renal dialysis: Secondary | ICD-10-CM | POA: Diagnosis not present

## 2021-12-22 DIAGNOSIS — E7849 Other hyperlipidemia: Secondary | ICD-10-CM | POA: Diagnosis not present

## 2021-12-22 DIAGNOSIS — D519 Vitamin B12 deficiency anemia, unspecified: Secondary | ICD-10-CM | POA: Diagnosis not present

## 2021-12-22 DIAGNOSIS — J449 Chronic obstructive pulmonary disease, unspecified: Secondary | ICD-10-CM | POA: Diagnosis not present

## 2021-12-22 DIAGNOSIS — Z0001 Encounter for general adult medical examination with abnormal findings: Secondary | ICD-10-CM | POA: Diagnosis not present

## 2021-12-22 DIAGNOSIS — N186 End stage renal disease: Secondary | ICD-10-CM | POA: Diagnosis not present

## 2021-12-22 DIAGNOSIS — Z992 Dependence on renal dialysis: Secondary | ICD-10-CM | POA: Diagnosis not present

## 2021-12-22 DIAGNOSIS — I120 Hypertensive chronic kidney disease with stage 5 chronic kidney disease or end stage renal disease: Secondary | ICD-10-CM | POA: Diagnosis not present

## 2021-12-22 DIAGNOSIS — F17210 Nicotine dependence, cigarettes, uncomplicated: Secondary | ICD-10-CM | POA: Diagnosis not present

## 2021-12-22 DIAGNOSIS — Z1329 Encounter for screening for other suspected endocrine disorder: Secondary | ICD-10-CM | POA: Diagnosis not present

## 2021-12-22 DIAGNOSIS — E559 Vitamin D deficiency, unspecified: Secondary | ICD-10-CM | POA: Diagnosis not present

## 2021-12-23 DIAGNOSIS — Z992 Dependence on renal dialysis: Secondary | ICD-10-CM | POA: Diagnosis not present

## 2021-12-23 DIAGNOSIS — N186 End stage renal disease: Secondary | ICD-10-CM | POA: Diagnosis not present

## 2021-12-25 DIAGNOSIS — N186 End stage renal disease: Secondary | ICD-10-CM | POA: Diagnosis not present

## 2021-12-25 DIAGNOSIS — Z992 Dependence on renal dialysis: Secondary | ICD-10-CM | POA: Diagnosis not present

## 2021-12-28 ENCOUNTER — Encounter (HOSPITAL_COMMUNITY): Payer: Self-pay | Admitting: Emergency Medicine

## 2021-12-28 ENCOUNTER — Other Ambulatory Visit: Payer: Self-pay

## 2021-12-28 ENCOUNTER — Emergency Department (HOSPITAL_COMMUNITY)
Admission: EM | Admit: 2021-12-28 | Discharge: 2021-12-28 | Disposition: A | Discharge status: Home / Self Care | Payer: Medicare Other | Attending: Emergency Medicine | Admitting: Emergency Medicine

## 2021-12-28 DIAGNOSIS — J449 Chronic obstructive pulmonary disease, unspecified: Secondary | ICD-10-CM | POA: Insufficient documentation

## 2021-12-28 DIAGNOSIS — I5032 Chronic diastolic (congestive) heart failure: Secondary | ICD-10-CM | POA: Insufficient documentation

## 2021-12-28 DIAGNOSIS — F17210 Nicotine dependence, cigarettes, uncomplicated: Secondary | ICD-10-CM | POA: Insufficient documentation

## 2021-12-28 DIAGNOSIS — R000 Tachycardia, unspecified: Secondary | ICD-10-CM | POA: Diagnosis not present

## 2021-12-28 DIAGNOSIS — Z743 Need for continuous supervision: Secondary | ICD-10-CM | POA: Diagnosis not present

## 2021-12-28 DIAGNOSIS — N186 End stage renal disease: Secondary | ICD-10-CM | POA: Diagnosis not present

## 2021-12-28 DIAGNOSIS — I471 Supraventricular tachycardia: Secondary | ICD-10-CM | POA: Insufficient documentation

## 2021-12-28 DIAGNOSIS — I132 Hypertensive heart and chronic kidney disease with heart failure and with stage 5 chronic kidney disease, or end stage renal disease: Secondary | ICD-10-CM | POA: Insufficient documentation

## 2021-12-28 DIAGNOSIS — I499 Cardiac arrhythmia, unspecified: Secondary | ICD-10-CM | POA: Diagnosis not present

## 2021-12-28 DIAGNOSIS — Z7982 Long term (current) use of aspirin: Secondary | ICD-10-CM | POA: Insufficient documentation

## 2021-12-28 DIAGNOSIS — Z7951 Long term (current) use of inhaled steroids: Secondary | ICD-10-CM | POA: Insufficient documentation

## 2021-12-28 DIAGNOSIS — Z992 Dependence on renal dialysis: Secondary | ICD-10-CM | POA: Diagnosis not present

## 2021-12-28 LAB — BASIC METABOLIC PANEL
Anion gap: 16 — ABNORMAL HIGH (ref 5–15)
BUN: 46 mg/dL — ABNORMAL HIGH (ref 6–20)
CO2: 22 mmol/L (ref 22–32)
Calcium: 7.8 mg/dL — ABNORMAL LOW (ref 8.9–10.3)
Chloride: 102 mmol/L (ref 98–111)
Creatinine, Ser: 12.66 mg/dL — ABNORMAL HIGH (ref 0.61–1.24)
GFR, Estimated: 4 mL/min — ABNORMAL LOW (ref >60)
Glucose, Bld: 91 mg/dL (ref 70–99)
Potassium: 3.7 mmol/L (ref 3.5–5.1)
Sodium: 140 mmol/L (ref 135–145)

## 2021-12-28 LAB — CBC
HCT: 50.2 % (ref 39.0–52.0)
Hemoglobin: 15.3 g/dL (ref 13.0–17.0)
MCH: 29.4 pg (ref 26.0–34.0)
MCHC: 30.5 g/dL (ref 30.0–36.0)
MCV: 96.4 fL (ref 80.0–100.0)
Platelets: 187 10*3/uL (ref 150–400)
RBC: 5.21 MIL/uL (ref 4.22–5.81)
RDW: 16.1 % — ABNORMAL HIGH (ref 11.5–15.5)
WBC: 8.5 10*3/uL (ref 4.0–10.5)
nRBC: 0 % (ref 0.0–0.2)

## 2021-12-28 LAB — TSH: TSH: 0.4 u[IU]/mL (ref 0.350–4.500)

## 2021-12-28 LAB — ETHANOL: Alcohol, Ethyl (B): <10 mg/dL (ref <10)

## 2021-12-28 NOTE — Discharge Instructions (Addendum)
It was a pleasure caring for you today in the emergency department.  Please return to the emergency department for any worsening or worrisome symptoms.  Please follow-up with cardiology tomorrow  Please go to your regularly scheduled dialysis session on Wednesday

## 2021-12-28 NOTE — ED Provider Notes (Signed)
[facility] EMERGENCY DEPARTMENT Provider Note   CSN: [id number] Arrival date & time: 12/28/21  4431     History  Chief Complaint  Patient presents with   Tachycardia    Brian Barrera is a 51 y.o. male.  Patient as above with significant medical history as below, including ESRD on HD Monday Wednesday Friday, diastolic heart failure, COPD, hypertension, ESRD who presents to the ED with complaint of rapid heart rate.  Patient was receiving dialysis this morning, received 1.5 hours worth, he began to have chest pain, rapid heart rate.  Dialysis center was initially concern for possible A-fib however on EMS arrival there telemetry showed SVT.  He was given 18 mg of adenosine, 25 mg of Cardizem with conversion; he was hypotensive after this so given 250-500 mL of IV fluids which improved hemodynamics.  On arrival patient reports that he is asymptomatic.  He feels normal.  He has appoint with cardiology tomorrow.  Normal state of health prior to onset of symptoms this morning     Past Medical History:  Diagnosis Date   Anemia    Attributed to chronic disease/chronic kidney disease   Chronic diastolic heart failure ([place]) 2011   EF 35% by echo in 2011; echo in 08/2011-normal EF, moderate to severe LVH; BNP level of [phone] in 2011-12   Chronic obstructive pulmonary disease ([name])    with asthmatic component   ESRD (end stage renal disease) on dialysis ([name])    secondary hyperparathyroidism   Hypertension    h/o hypertensive crisis with encephalopathy   Obesity    Tobacco abuse    Approximate consumption of 25 pack years; continuing at 0.5 pack per day    Past Surgical History:  Procedure Laterality Date   ARTERIOVENOUS GRAFT PLACEMENT  2011   COLONOSCOPY N/A 02/04/2014   Procedure: COLONOSCOPY;  Surgeon: [name], MD;  Location: [facility];  Service: Endoscopy;  Laterality: N/A;  12:00   COLONOSCOPY N/A 07/03/2019   Procedure: COLONOSCOPY;  Surgeon: [name], MD;   Location: [facility];  Service: Endoscopy;  Laterality: N/A;  12:00-office rescheduled 2/9 @ 11:00am   POLYPECTOMY  07/03/2019   Procedure: POLYPECTOMY;  Surgeon: [name], MD;  Location: [facility];  Service: Endoscopy;;     The history is provided by the patient. No language interpreter was used.       Home Medications Prior to Admission medications   Medication Sig Start Date End Date Taking? Authorizing Provider  albuterol (ACCUNEB) 1.25 MG/3ML nebulizer solution Take 1 ampule by nebulization as needed for wheezing or shortness of breath.    [provider]  albuterol (PROAIR HFA) 108 (90 BASE) MCG/ACT inhaler Inhale 2 puffs into the lungs every 4 (four) hours as needed for wheezing. 02/11/14   Brian Rossetti, MD  aspirin EC 81 MG tablet Take 81 mg by mouth daily.    [provider]  atorvastatin (LIPITOR) 10 MG tablet Take 10 mg by mouth daily.    [provider]  AURYXIA 1 GM 210 MG(Fe) tablet Take 420 mg by mouth 3 (three) times daily with meals.  11/02/16   [provider]  b complex-vitamin c-folic acid (NEPHRO-VITE) 0.8 MG TABS Take 0.8 mg by mouth daily.     [provider]  budesonide-formoterol (SYMBICORT) 80-4.5 MCG/ACT inhaler Inhale 2 puffs into the lungs 2 (two) times daily. Patient not taking: Reported on 11/02/2021 06/05/13   Brian Rossetti, MD  cinacalcet [facility])  60 MG tablet Take 60 mg by mouth daily.    [provider]  diltiazem (CARDIZEM) 30 MG tablet Take 2 tabs ('60mg'$ ) by mouth every morning only on dialysis days - may take an extra 1 tab ('30mg'$ ) in the middle of dialysis as needed for rapid heart rate. 09/25/21   Brian Lenis, MD  diphenhydramine-acetaminophen (TYLENOL PM) 25-500 MG TABS tablet Take 1 tablet by mouth at bedtime as needed (sleep).    [provider]  DULoxetine (CYMBALTA) 30 MG capsule Take 30 mg by mouth daily. 11/08/19   [provider]  gabapentin (NEURONTIN)  300 MG capsule Take 300 mg by mouth daily. 10/09/19   [provider]  Omega-3 1000 MG CAPS Take 2,000 mg by mouth daily.    [provider]  oxyCODONE-acetaminophen (PERCOCET) 7.5-325 MG tablet Take 1 tablet by mouth every 6 (six) hours as needed for moderate pain. 10/09/19   [provider]  sevelamer carbonate (RENVELA) 800 MG tablet Take 1,600-3,200 mg by mouth See admin instructions. Take 3200 mg with meals and 1600 with snack    [provider]      Allergies    Patient has no known allergies.    Review of Systems   Review of Systems  Constitutional:  Negative for chills and fever.  HENT:  Negative for facial swelling and trouble swallowing.   Eyes:  Negative for photophobia and visual disturbance.  Respiratory:  Negative for cough and shortness of breath.   Cardiovascular:  Positive for chest pain and palpitations. Negative for leg swelling.  Gastrointestinal:  Negative for abdominal pain, nausea and vomiting.  Endocrine: Negative for polydipsia and polyuria.  Genitourinary:  Negative for difficulty urinating and hematuria.  Musculoskeletal:  Negative for gait problem and joint swelling.  Skin:  Negative for pallor and rash.  Neurological:  Negative for syncope and headaches.  Psychiatric/Behavioral:  Negative for agitation and confusion.     Physical Exam Updated Vital Signs BP (!) 133/98   Pulse 72   Temp 98.2 F (36.8 C) (Oral)   Resp 14   Ht 6' (1.829 m)   Wt 117.9 kg   SpO2 99%   BMI 35.26 kg/m  Physical Exam Vitals and nursing note reviewed.  Constitutional:      General: He is not in acute distress.    Appearance: He is well-developed.  HENT:     Head: Normocephalic and atraumatic.     Right Ear: External ear normal.     Left Ear: External ear normal.     Mouth/Throat:     Mouth: Mucous membranes are moist.  Eyes:     General: No scleral icterus. Cardiovascular:     Rate and Rhythm: Normal rate and regular rhythm.      Pulses: Normal pulses.     Heart sounds: Normal heart sounds.  Pulmonary:     Effort: Pulmonary effort is normal. No respiratory distress.     Breath sounds: Normal breath sounds.  Abdominal:     General: Abdomen is flat.     Palpations: Abdomen is soft.     Tenderness: There is no abdominal tenderness.  Musculoskeletal:        General: Normal range of motion.     Cervical back: Normal range of motion.     Right lower leg: No edema.     Left lower leg: No edema.  Skin:    General: Skin is warm and dry.     Capillary Refill: Capillary  refill takes less than 2 seconds.       Neurological:     Mental Status: He is alert and oriented to person, place, and time.  Psychiatric:        Mood and Affect: Mood normal.        Behavior: Behavior normal.     ED Results / Procedures / Treatments   Labs (all labs ordered are listed, but only abnormal results are displayed) Labs Reviewed  CBC - Abnormal; Notable for the following components:      Result Value   RDW 16.1 (*)    All other components within normal limits  BASIC METABOLIC PANEL - Abnormal; Notable for the following components:   BUN 46 (*)    Creatinine, Ser 12.66 (*)    Calcium 7.8 (*)    GFR, Estimated 4 (*)    Anion gap 16 (*)    All other components within normal limits  TSH  ETHANOL    EKG EKG Interpretation  Date/Time:  Monday December 28 2021 07:30:07 EDT Ventricular Rate:  83 PR Interval:  184 QRS Duration: 87 QT Interval:  401 QTC Calculation: 472 R Axis:   2 Text Interpretation: Sinus rhythm Probable left atrial enlargement similar to prior no stemi Confirmed by Wynona Dove ([id number]) on 12/28/2021 10:32:53 AM  Radiology No results found.  Procedures Procedures    Medications Ordered in ED Medications - No data to display  ED Course/ Medical Decision Making/ A&P                           Medical Decision Making Amount and/or Complexity of Data Reviewed Labs: ordered. ECG/medicine tests:  ordered.    CC: rapid hr, palp, cp  This patient presents to the Emergency Department for the above complaint. This involves an extensive number of treatment options and is a complaint that carries with it a high risk of complications and morbidity. Vital signs were reviewed. Serious etiologies considered.  Differential includes all life-threatening causes for chest pain. This includes but is not exclusive to acute coronary syndrome, aortic dissection, pulmonary embolism, cardiac tamponade, community-acquired pneumonia, pericarditis, musculoskeletal chest wall pain, etc.  Record review:  Previous records obtained and reviewed recent ED visits, prior labs and imaging  Additional history obtained from [place] and surgical history as noted above.   Work up as above, notable for:  Labs & imaging results that were available during my care of the patient were visualized by me and considered in my medical decision making.  Physical exam as above.   Cardiac monitoring reviewed and interpreted personally which shows NSR  Labs reviewed, creatinine comparable to his baseline, is ESRD on HD.  Potassium is 3.7.  HDS.  Does not appear to have emergent need for dialysis at this time.  CBC stable. TSH stable.   ECG without acute ischemic changes or dysrhythmia     Management: N/a  ED Course:     Reassessment:  Patient remains asymptomatic  Admission was considered.   He has psvt a/w HD, has been seen for this previously, has cardiology appt tomorrow  Patient has been observed in the emergency room for approximately 3 hours, he has been on telemetry monitoring, no recurrence of arrhythmia.  He is asymptomatic.  Patient is history of paroxysmal SVT.  He was given adenosine PTA with resolution.  He has appoint with cardiology tomorrow.  Advised him to keep this appointment.  Advised him to  go to dialysis on Wednesday.  Discussed strict return precautions  The patient improved  significantly and was discharged in stable condition. Detailed discussions were had with the patient regarding current findings, and need for close f/u with PCP or on call doctor. The patient has been instructed to return immediately if the symptoms worsen in any way for re-evaluation. Patient verbalized understanding and is in agreement with current care plan. All questions answered prior to discharge.               Social determinants of health include -  Counseled patient for approximately 2 minutes regarding smoking cessation. Discussed risks of smoking and how they applied and affected their visit here today. Patient not ready to quit at this time, however will follow up with their primary doctor when they are.   CPT code: [phone]: intermediate counseling for smoking cessation   Social History   Socioeconomic History   Marital status: Single    Spouse name: Not on file   Number of children: 3   Years of education: Not on file   Highest education level: Not on file  Occupational History   Occupation: Disability  Tobacco Use   Smoking status: Every Day    Packs/day: 0.50    Years: 25.00    Total pack years: 12.50    Types: Cigarettes    Start date: 09/23/1985   Smokeless tobacco: Never   Tobacco comments:    8-10 ciggs per day   Substance and Sexual Activity   Alcohol use: No    Comment: quit etoh about 2011. used to drink about 8-10 beers a week   Drug use: No   Sexual activity: Not on file  Other Topics Concern   Not on file  Social History Narrative   Not on file   Social Determinants of Health   Financial Resource Strain: Not on file  Food Insecurity: Not on file  Transportation Needs: Not on file  Physical Activity: Not on file  Stress: Not on file  Social Connections: Not on file  Intimate Partner Violence: Not on file      This chart was dictated using voice recognition software.  Despite best efforts to proofread,  errors can occur which can  change the documentation meaning.         Final Clinical Impression(s) / ED Diagnoses Final diagnoses:  Paroxysmal SVT (supraventricular tachycardia) ([place])    Rx / DC Orders ED Discharge Orders     None         Jeanell Sparrow, DO 12/28/21 1034

## 2021-12-28 NOTE — ED Triage Notes (Signed)
Patient was 1.15hr into dialysis and started an abnormal HR of 170. Dialysis reported AFIB RVR, EMS reported SVT.  Patient was given '6mg'$ /'112mg'$  of adenosine with no results. EMS gave 25 of cardizem and patient converted. Patient was showing hypotensive and received 214m NS and has a current BP of 119/93. No, sob, no chest pain and patient is A&OX4.

## 2021-12-29 ENCOUNTER — Encounter: Payer: Self-pay | Admitting: Student

## 2021-12-29 ENCOUNTER — Ambulatory Visit (INDEPENDENT_AMBULATORY_CARE_PROVIDER_SITE_OTHER): Payer: Medicare Other | Admitting: Student

## 2021-12-29 ENCOUNTER — Telehealth: Payer: Self-pay | Admitting: Cardiology

## 2021-12-29 VITALS — BP 142/84 | HR 86 | Ht 72.0 in | Wt 267.8 lb

## 2021-12-29 DIAGNOSIS — Z0001 Encounter for general adult medical examination with abnormal findings: Secondary | ICD-10-CM | POA: Diagnosis not present

## 2021-12-29 DIAGNOSIS — J449 Chronic obstructive pulmonary disease, unspecified: Secondary | ICD-10-CM | POA: Diagnosis not present

## 2021-12-29 DIAGNOSIS — E785 Hyperlipidemia, unspecified: Secondary | ICD-10-CM | POA: Diagnosis not present

## 2021-12-29 DIAGNOSIS — N186 End stage renal disease: Secondary | ICD-10-CM | POA: Diagnosis not present

## 2021-12-29 DIAGNOSIS — Z8679 Personal history of other diseases of the circulatory system: Secondary | ICD-10-CM | POA: Diagnosis not present

## 2021-12-29 DIAGNOSIS — Z992 Dependence on renal dialysis: Secondary | ICD-10-CM | POA: Diagnosis not present

## 2021-12-29 DIAGNOSIS — I700 Atherosclerosis of aorta: Secondary | ICD-10-CM | POA: Diagnosis not present

## 2021-12-29 DIAGNOSIS — I779 Disorder of arteries and arterioles, unspecified: Secondary | ICD-10-CM | POA: Diagnosis not present

## 2021-12-29 DIAGNOSIS — I471 Supraventricular tachycardia: Secondary | ICD-10-CM | POA: Diagnosis not present

## 2021-12-29 DIAGNOSIS — Z23 Encounter for immunization: Secondary | ICD-10-CM | POA: Diagnosis not present

## 2021-12-29 DIAGNOSIS — I2510 Atherosclerotic heart disease of native coronary artery without angina pectoris: Secondary | ICD-10-CM | POA: Diagnosis not present

## 2021-12-29 DIAGNOSIS — I5032 Chronic diastolic (congestive) heart failure: Secondary | ICD-10-CM | POA: Diagnosis not present

## 2021-12-29 DIAGNOSIS — R030 Elevated blood-pressure reading, without diagnosis of hypertension: Secondary | ICD-10-CM | POA: Diagnosis not present

## 2021-12-29 DIAGNOSIS — I2584 Coronary atherosclerosis due to calcified coronary lesion: Secondary | ICD-10-CM | POA: Diagnosis not present

## 2021-12-29 DIAGNOSIS — E7849 Other hyperlipidemia: Secondary | ICD-10-CM | POA: Diagnosis not present

## 2021-12-29 MED ORDER — METOPROLOL TARTRATE 25 MG PO TABS: 25.0000 mg | ORAL_TABLET | Freq: Two times a day (BID) | ORAL | 3 refills | Status: DC

## 2021-12-29 NOTE — Telephone Encounter (Signed)
[facility], pt PCP office, called wanting to inform [place] and Dr. Harl Bowie before the pt's appt today that pt has had increased SVT, palpitations, and tachycardia. She stated that pt was seen in [facility] recently as well.

## 2021-12-29 NOTE — Patient Instructions (Signed)
Medication Instructions:   Stop Taking Cardizem  Start Taking Lopressor 25 mg Two Times Daily   *If you need a refill on your cardiac medications before your next appointment, please call your pharmacy*   Lab Work: NONE   If you have labs (blood work) drawn today and your tests are completely normal, you will receive your results only by: [place] (if you have MyChart) OR A paper copy in the mail If you have any lab test that is abnormal or we need to change your treatment, we will call you to review the results.   Testing/Procedures: Your physician has requested that you have an echocardiogram. Echocardiography is a painless test that uses sound waves to create images of your heart. It provides your doctor with information about the size and shape of your heart and how well your heart's chambers and valves are working. This procedure takes approximately one hour. There are no restrictions for this procedure.    Follow-Up: At [facility], you and your health needs are our priority.  As part of our continuing mission to provide you with exceptional heart care, we have created designated Provider Care Teams.  These Care Teams include your primary Cardiologist (physician) and Advanced Practice Providers (APPs -  Physician Assistants and Nurse Practitioners) who all work together to provide you with the care you need, when you need it.  We recommend signing up for the patient portal called "MyChart".  Sign up information is provided on this After Visit Summary.  MyChart is used to connect with patients for Virtual Visits (Telemedicine).  Patients are able to view lab/test results, encounter notes, upcoming appointments, etc.  Non-urgent messages can be sent to your provider as well.   To learn more about what you can do with MyChart, go to [email or web address].    Your next appointment:   3 -4 month(s)  The format for your next appointment:   In Person  Provider:    Carlyle Dolly, MD    Other Instructions Thank you for choosing [facility]!    Important Information About Sugar

## 2021-12-29 NOTE — Progress Notes (Signed)
Cardiology Office Note    Date:  12/29/2021   ID:  Brian Barrera, DOB 01-22-1971, MRN [id number]  PCP:  Curlene Labrum, MD  Cardiologist: Carlyle Dolly, MD    Chief Complaint  Patient presents with   Follow-up    Recurrent Emergency Dept visits for SVT    History of Present Illness:    Brian Barrera is a 51 y.o. male with past medical history of HFimpEF (EF 35% in 2011, normalized to 60-65% by imaging in 12/2013), HTN, HLD, COPD and ESRD who presents to the office today for overdue follow-up and Emergency Dept follow-up.  He was last examined by Katina Dung, NP in 03/2021 and had been evaluated in the ED at least twice earlier in the year due to episodes of SVT. Cardizem had recently been decreased by his Nephrologist and he was continued on Cardizem '30mg'$  once daily.   By review of notes, he presented to [facility] ED on 11/02/2021 for chest pain while at dialysis and was found to be in SVT. He was given Adenosine and converted back to normal sinus rhythm. He had a recurrent evaluation in the ED on 11/13/2021 for palpitations and again converted back to normal sinus rhythm with 12 mg of Adenosine. Most recent episode was on 12/28/2021 as he was in SVT upon EMS arrival and received 25 mg of Cardizem by EMS along with an 18 mg total of Adenosine and converted to normal sinus rhythm. He did have hypotension afterwards which resolved with IV fluids. Labs were stable with hemoglobin at 15.3, Na+ 140, K+ 3.7 and creatinine 12.6. TSH was normal at 0.40. He was discharged home and informed to keep scheduled Cardiology follow-up.   In talking with the patient today, he reports that he is very symptomatic when he has episodes of SVT and has discomfort in his chest at that time. No associated dizziness or presyncope. This typically occurs while undergoing dialysis and he does take short-acting Cardizem 60 mg before dialysis each morning. He reports occasional palpitations at home which resolve  with vagal maneuvers or Cardizem. He denies any exertional chest pain or dyspnea on exertion. No recent orthopnea, PND or pitting edema. He does consume occasional sodas but denies any significant caffeine use and does not consume alcohol.   Past Medical History:  Diagnosis Date   Anemia    Attributed to chronic disease/chronic kidney disease   Chronic diastolic heart failure ([place]) 2011   EF 35% by echo in 2011; echo in 08/2011-normal EF, moderate to severe LVH; BNP level of [id number] in 2011-12   Chronic obstructive pulmonary disease ([place])    with asthmatic component   ESRD (end stage renal disease) on dialysis ([place])    secondary hyperparathyroidism   Hypertension    h/o hypertensive crisis with encephalopathy   Obesity    Tobacco abuse    Approximate consumption of 25 pack years; continuing at 0.5 pack per day    Past Surgical History:  Procedure Laterality Date   ARTERIOVENOUS GRAFT PLACEMENT  2011   COLONOSCOPY N/A 02/04/2014   Procedure: COLONOSCOPY;  Surgeon: [name], MD;  [facility];  Service: Endoscopy;  Laterality: N/A;  12:00   COLONOSCOPY N/A 07/03/2019   Procedure: COLONOSCOPY;  Surgeon: [name], MD;  Location[facility];  Service: Endoscopy;  Laterality: N/A;  12:00-office rescheduled 2/9 @ 11:00am   POLYPECTOMY  07/03/2019   Procedure: POLYPECTOMY;  Surgeon: [name], MD;  Location: [facility]  END[facility];  Service: Endoscopy;;    Current Medications: Outpatient Medications Prior to Visit  Medication Sig Dispense Refill   albuterol (ACCUNEB) 1.25 MG/3ML nebulizer solution Take 1 ampule by nebulization as needed for wheezing or shortness of breath.     albuterol (PROAIR HFA) 108 (90 BASE) MCG/ACT inhaler Inhale 2 puffs into the lungs every 4 (four) hours as needed for wheezing. 3 Inhaler 0   aspirin EC 81 MG tablet Take 81 mg by mouth daily.     atorvastatin (LIPITOR) 10 MG tablet Take 10 mg by mouth daily.     AURYXIA 1 GM 210 MG(Fe) tablet  Take 420 mg by mouth 3 (three) times daily with meals.      b complex-vitamin c-folic acid (NEPHRO-VITE) 0.8 MG TABS Take 0.8 mg by mouth daily.      budesonide-formoterol (SYMBICORT) 80-4.5 MCG/ACT inhaler Inhale 2 puffs into the lungs 2 (two) times daily. 1 Inhaler 12   cinacalcet (SENSIPAR) 60 MG tablet Take 60 mg by mouth daily.     diphenhydramine-acetaminophen (TYLENOL PM) 25-500 MG TABS tablet Take 1 tablet by mouth at bedtime as needed (sleep).     DULoxetine (CYMBALTA) 30 MG capsule Take 30 mg by mouth daily.     gabapentin (NEURONTIN) 300 MG capsule Take 300 mg by mouth daily.     Omega-3 1000 MG CAPS Take 2,000 mg by mouth daily.     oxyCODONE-acetaminophen (PERCOCET) 7.5-325 MG tablet Take 1 tablet by mouth every 6 (six) hours as needed for moderate pain.     sevelamer carbonate (RENVELA) 800 MG tablet Take 1,600-3,200 mg by mouth See admin instructions. Take 3200 mg with meals and 1600 with snack     diltiazem (CARDIZEM) 30 MG tablet Take 2 tabs ('60mg'$ ) by mouth every morning only on dialysis days - may take an extra 1 tab ('30mg'$ ) in the middle of dialysis as needed for rapid heart rate. 75 tablet 6   No facility-administered medications prior to visit.     Allergies:   Patient has no known allergies.   Social History   Socioeconomic History   Marital status: Single    Spouse name: Not on file   Number of children: 3   Years of education: Not on file   Highest education level: Not on file  Occupational History   Occupation: Disability  Tobacco Use   Smoking status: Every Day    Packs/day: 0.50    Years: 25.00    Total pack years: 12.50    Types: Cigarettes    Start date: 09/23/1985   Smokeless tobacco: Never   Tobacco comments:    8-10 ciggs per day   Vaping Use   Vaping Use: Never used  Substance and Sexual Activity   Alcohol use: No    Comment: quit etoh about 2011. used to drink about 8-10 beers a week   Drug use: No   Sexual activity: Not on file  Other Topics  Concern   Not on file  Social History Narrative   Not on file   Social Determinants of Health   Financial Resource Strain: Not on file  Food Insecurity: Not on file  Transportation Needs: Not on file  Physical Activity: Not on file  Stress: Not on file  Social Connections: Not on file     Family History:  The patient's family history includes Diabetes in his sister and sister; Heart disease in his father; Hypertension in his brother, brother, brother, mother, sister, sister, and sister;  Kidney disease in his mother.   Review of Systems:    Please see the history of present illness.     All other systems reviewed and are otherwise negative except as noted above.   Physical Exam:    VS:  BP (!) 142/84   Pulse 86   Ht 6' (1.829 m)   Wt 267 lb 12.8 oz (121.5 kg)   SpO2 96%   BMI 36.32 kg/m    General: Well developed, well nourished,male appearing in no acute distress. Head: Normocephalic, atraumatic. Neck: No carotid bruits. JVD not elevated.  Lungs: Respirations regular and unlabored, without wheezes or rales.  Heart: Regular rate and rhythm. No S3 or S4.  No murmur, no rubs, or gallops appreciated. Abdomen: Appears non-distended. No obvious abdominal masses. Msk:  Strength and tone appear normal for age. No obvious joint deformities or effusions. Extremities: No clubbing or cyanosis. No pitting edema.  Distal pedal pulses are 2+ bilaterally. Neuro: Alert and oriented X 3. Moves all extremities spontaneously. No focal deficits noted. Psych:  Responds to questions appropriately with a normal affect. Skin: No rashes or lesions noted  Wt Readings from Last 3 Encounters:  12/29/21 267 lb 12.8 oz (121.5 kg)  12/28/21 260 lb (117.9 kg)  11/02/21 260 lb (117.9 kg)     Studies/Labs Reviewed:   EKG:  EKG is not ordered today. EKG from 11/13/2021 is reviewed and shows narrow complex tachycardia, heart rate 195 and diffuse ST depression which is likely rate related. Appears most  consistent with SVT.  Recent Labs: 11/02/2021: ALT 14 11/13/2021: Magnesium 2.1 12/28/2021: BUN 46; Creatinine, Ser 12.66; Hemoglobin 15.3; Platelets 187; Potassium 3.7; Sodium 140; TSH 0.400   Lipid Panel    Component Value Date/Time   CHOL 175 01/04/2013 1140   TRIG 172 (H) 01/04/2013 1140   HDL 45 01/04/2013 1140   CHOLHDL 3.9 01/04/2013 1140   VLDL 34 01/04/2013 1140   LDLCALC 96 01/04/2013 1140    Additional studies/ records that were reviewed today include:   NST: 03/2019 No diagnostic ST segment changes to indicate ischemia. Small, mild intensity, inferior defect that is partially reversible at the apex in the setting of increased radiotracer uptake on rest imaging near the inferior wall. Otherwise defect is fixed and suggestive of soft tissue attenuation. Cannot exclude a mild inferior apical ischemic territory, although with reduced specificity. This is a low risk study. Nuclear stress EF: 53%.  Event Monitor: 07/2020 14 day event monitor Rare supraventricular ectopy. Rare episodes of SVT up to 14 beats. Rare ventricular ectopy. One episode of NSVT lasting 6 beats. Symptoms reported but without sufficent data to correlate with heart rhythm at the time.     Patch Wear Time:  14 days and 0 hours (2022-02-22T18:05:23-0500 to 2022-03-08T18:05:27-0500)   Patient had a min HR of 64 bpm, max HR of 207 bpm, and avg HR of 92 bpm. Predominant underlying rhythm was Sinus Rhythm. 1 run of Ventricular Tachycardia occurred lasting 6 beats with a max rate of 160 bpm (avg 148 bpm). 7 Supraventricular Tachycardia  runs occurred, the run with the fastest interval lasting 14 beats with a max rate of 207 bpm (avg 165 bpm); the run with the fastest interval was also the longest. Isolated SVEs were rare (<1.0%), SVE Couplets were rare (<1.0%), and SVE Triplets were  rare (<1.0%). Isolated VEs were rare (<1.0%), VE Couplets were rare (<1.0%), and no VE Triplets were present. Ventricular Trigeminy  was present. Inverted QRS complexes possibly due  to inverted placement of device.  Assessment:    1. SVT (supraventricular tachycardia) ([place])   2. History of cardiomyopathy   3. Hyperlipidemia, unspecified hyperlipidemia type   4. ESRD (end stage renal disease) on dialysis [facility])      Plan:   In order of problems listed above:  1. SVT - He has a history of SVT but has developed more frequent episodes over the past few months. Electrolytes and TSH have been checked and WNL. Will update an echocardiogram.  - He has been taking short-acting Cardizem 60 mg once daily which has not helped with his arrhythmias and previously intolerant to higher doses due to hypotension during HD. Reviewed with Dr. Harl Bowie and will stop short-acting Cardizem and switch to Lopressor 25 mg twice daily. If he develops hypotension during HD, would hold Lopressor the evening before his sessions. Will also enter a referral for EP as he may be a candidate for ablation.  2. HFimpEF - His ejection fraction was previously as low as 35% in 2011 but had normalized by repeat imaging. Given his recurrent episodes of SVT, will obtain a follow-up echocardiogram to ensure he has not developed a recurrent cardiomyopathy. He is no longer on diuretic therapy and volume status has been managed by HD.   3. HLD - Followed by his PCP. Will request a copy of most recent labs. He remains on Atorvastatin 10 mg daily.   4. ESRD - On HD - MWF schedule. He was previously being considered for a renal transplant but states he has been unable to quit smoking.   Medication Adjustments/Labs and Tests Ordered: Current medicines are reviewed at length with the patient today.  Concerns regarding medicines are outlined above.  Medication changes, Labs and Tests ordered today are listed in the Patient Instructions below. Patient Instructions  Medication Instructions:   Stop Taking Cardizem  Start Taking Lopressor 25 mg Two Times Daily   *If  you need a refill on your cardiac medications before your next appointment, please call your pharmacy*   Lab Work: NONE   If you have labs (blood work) drawn today and your tests are completely normal, you will receive your results only by: [place] (if you have MyChart) OR A paper copy in the mail If you have any lab test that is abnormal or we need to change your treatment, we will call you to review the results.   Testing/Procedures: Your physician has requested that you have an echocardiogram. Echocardiography is a painless test that uses sound waves to create images of your heart. It provides your doctor with information about the size and shape of your heart and how well your heart's chambers and valves are working. This procedure takes approximately one hour. There are no restrictions for this procedure.    Follow-Up: At [facility], you and your health needs are our priority.  As part of our continuing mission to provide you with exceptional heart care, we have created designated Provider Care Teams.  These Care Teams include your primary Cardiologist (physician) and Advanced Practice Providers (APPs -  Physician Assistants and Nurse Practitioners) who all work together to provide you with the care you need, when you need it.  We recommend signing up for the patient portal called "MyChart".  Sign up information is provided on this After Visit Summary.  MyChart is used to connect with patients for Virtual Visits (Telemedicine).  Patients are able to view lab/test results, encounter notes, upcoming appointments, etc.  Non-urgent messages  can be sent to your provider as well.   To learn more about what you can do with MyChart, go to [email or web address].    Your next appointment:   3 -4 month(s)  The format for your next appointment:   In Person  Provider:   Carlyle Dolly, MD    Other Instructions Thank you for choosing [facility]!    Important  Information About Sugar         Signed, Erma Heritage, PA-C  12/29/2021 5:02 PM    [facility] [address] [place], [place] [address] Phone: [phone] Fax: [phone]

## 2021-12-29 NOTE — Telephone Encounter (Signed)
Per Bernerd Pho, PA-C:  Yes, I reviewed his chart and he has had 3 separate Emergency Dept evaluations for this recently. Will address at his visit.   Thanks,  [place]

## 2021-12-30 DIAGNOSIS — Z992 Dependence on renal dialysis: Secondary | ICD-10-CM | POA: Diagnosis not present

## 2021-12-30 DIAGNOSIS — N186 End stage renal disease: Secondary | ICD-10-CM | POA: Diagnosis not present

## 2022-01-01 DIAGNOSIS — N186 End stage renal disease: Secondary | ICD-10-CM | POA: Diagnosis not present

## 2022-01-01 DIAGNOSIS — Z992 Dependence on renal dialysis: Secondary | ICD-10-CM | POA: Diagnosis not present

## 2022-01-04 DIAGNOSIS — Z992 Dependence on renal dialysis: Secondary | ICD-10-CM | POA: Diagnosis not present

## 2022-01-04 DIAGNOSIS — N186 End stage renal disease: Secondary | ICD-10-CM | POA: Diagnosis not present

## 2022-01-06 DIAGNOSIS — N186 End stage renal disease: Secondary | ICD-10-CM | POA: Diagnosis not present

## 2022-01-06 DIAGNOSIS — Z992 Dependence on renal dialysis: Secondary | ICD-10-CM | POA: Diagnosis not present

## 2022-01-07 ENCOUNTER — Ambulatory Visit (HOSPITAL_COMMUNITY)
Admission: RE | Admit: 2022-01-07 | Discharge: 2022-01-07 | Disposition: A | Discharge status: Home / Self Care | Payer: Medicare Other | Source: Ambulatory Visit | Attending: Student | Admitting: Student

## 2022-01-07 DIAGNOSIS — I471 Supraventricular tachycardia: Secondary | ICD-10-CM | POA: Diagnosis not present

## 2022-01-07 LAB — ECHOCARDIOGRAM COMPLETE
Area-P 1/2: 2.83 cm2
S' Lateral: 4.1 cm

## 2022-01-07 NOTE — Progress Notes (Signed)
*  PRELIMINARY RESULTS* Echocardiogram 2D Echocardiogram has been performed.  Brian Barrera 01/07/2022, 9:14 AM

## 2022-01-08 DIAGNOSIS — N186 End stage renal disease: Secondary | ICD-10-CM | POA: Diagnosis not present

## 2022-01-08 DIAGNOSIS — Z992 Dependence on renal dialysis: Secondary | ICD-10-CM | POA: Diagnosis not present

## 2022-01-11 DIAGNOSIS — Z992 Dependence on renal dialysis: Secondary | ICD-10-CM | POA: Diagnosis not present

## 2022-01-11 DIAGNOSIS — N186 End stage renal disease: Secondary | ICD-10-CM | POA: Diagnosis not present

## 2022-01-13 DIAGNOSIS — Z992 Dependence on renal dialysis: Secondary | ICD-10-CM | POA: Diagnosis not present

## 2022-01-13 DIAGNOSIS — N186 End stage renal disease: Secondary | ICD-10-CM | POA: Diagnosis not present

## 2022-01-15 DIAGNOSIS — Z992 Dependence on renal dialysis: Secondary | ICD-10-CM | POA: Diagnosis not present

## 2022-01-15 DIAGNOSIS — N186 End stage renal disease: Secondary | ICD-10-CM | POA: Diagnosis not present

## 2022-01-18 DIAGNOSIS — Z992 Dependence on renal dialysis: Secondary | ICD-10-CM | POA: Diagnosis not present

## 2022-01-18 DIAGNOSIS — N186 End stage renal disease: Secondary | ICD-10-CM | POA: Diagnosis not present

## 2022-01-20 DIAGNOSIS — N186 End stage renal disease: Secondary | ICD-10-CM | POA: Diagnosis not present

## 2022-01-20 DIAGNOSIS — Z992 Dependence on renal dialysis: Secondary | ICD-10-CM | POA: Diagnosis not present

## 2022-01-21 DIAGNOSIS — N186 End stage renal disease: Secondary | ICD-10-CM | POA: Diagnosis not present

## 2022-01-21 DIAGNOSIS — Z992 Dependence on renal dialysis: Secondary | ICD-10-CM | POA: Diagnosis not present

## 2022-01-22 DIAGNOSIS — Z992 Dependence on renal dialysis: Secondary | ICD-10-CM | POA: Diagnosis not present

## 2022-01-22 DIAGNOSIS — N186 End stage renal disease: Secondary | ICD-10-CM | POA: Diagnosis not present

## 2022-01-25 DIAGNOSIS — N186 End stage renal disease: Secondary | ICD-10-CM | POA: Diagnosis not present

## 2022-01-25 DIAGNOSIS — Z992 Dependence on renal dialysis: Secondary | ICD-10-CM | POA: Diagnosis not present

## 2022-01-26 ENCOUNTER — Ambulatory Visit: Payer: Medicare Other | Attending: Internal Medicine | Admitting: Internal Medicine

## 2022-01-26 ENCOUNTER — Encounter: Payer: Self-pay | Admitting: Internal Medicine

## 2022-01-26 VITALS — BP 124/90 | HR 83 | Ht 72.0 in | Wt 262.0 lb

## 2022-01-26 DIAGNOSIS — I471 Supraventricular tachycardia: Secondary | ICD-10-CM

## 2022-01-26 NOTE — Progress Notes (Signed)
HPI [name] is referred by Bernerd Pho for evaluation of SVT. He is a pleasant 51 yo man with ESRD on HD, who has developed SVT about a year ago. The patient has not had syncope. He has been to the ED 6 times for SVT and has received IV adenosine. He was started on a beta blocker and his symptoms have improved. He has not had an episode since starting metoprolol.   No Known Allergies   Current Outpatient Medications  Medication Sig Dispense Refill   albuterol (ACCUNEB) 1.25 MG/3ML nebulizer solution Take 1 ampule by nebulization as needed for wheezing or shortness of breath.     albuterol (PROAIR HFA) 108 (90 BASE) MCG/ACT inhaler Inhale 2 puffs into the lungs every 4 (four) hours as needed for wheezing. 3 Inhaler 0   aspirin EC 81 MG tablet Take 81 mg by mouth daily.     atorvastatin (LIPITOR) 10 MG tablet Take 10 mg by mouth daily.     AURYXIA 1 GM 210 MG(Fe) tablet Take 420 mg by mouth 3 (three) times daily with meals.      b complex-vitamin c-folic acid (NEPHRO-VITE) 0.8 MG TABS Take 0.8 mg by mouth daily.      budesonide-formoterol (SYMBICORT) 80-4.5 MCG/ACT inhaler Inhale 2 puffs into the lungs 2 (two) times daily. 1 Inhaler 12   cinacalcet (SENSIPAR) 60 MG tablet Take 60 mg by mouth daily.     diphenhydramine-acetaminophen (TYLENOL PM) 25-500 MG TABS tablet Take 1 tablet by mouth at bedtime as needed (sleep).     DULoxetine (CYMBALTA) 30 MG capsule Take 30 mg by mouth daily.     gabapentin (NEURONTIN) 300 MG capsule Take 300 mg by mouth daily.     metoprolol tartrate (LOPRESSOR) 25 MG tablet Take 1 tablet (25 mg total) by mouth 2 (two) times daily. 180 tablet 3   Omega-3 1000 MG CAPS Take 2,000 mg by mouth daily.     oxyCODONE-acetaminophen (PERCOCET) 7.5-325 MG tablet Take 1 tablet by mouth every 6 (six) hours as needed for moderate pain.     sevelamer carbonate (RENVELA) 800 MG tablet Take 1,600-3,200 mg by mouth See admin instructions. Take 3200 mg with meals and  1600 with snack     No current facility-administered medications for this visit.     Past Medical History:  Diagnosis Date   Anemia    Attributed to chronic disease/chronic kidney disease   Chronic diastolic heart failure ([place]) 2011   EF 35% by echo in 2011; echo in 08/2011-normal EF, moderate to severe LVH; BNP level of [phone] in 2011-12   Chronic obstructive pulmonary disease ([place])    with asthmatic component   ESRD (end stage renal disease) on dialysis ([place])    secondary hyperparathyroidism   Hypertension    h/o hypertensive crisis with encephalopathy   Obesity    Tobacco abuse    Approximate consumption of 25 pack years; continuing at 0.5 pack per day    ROS:   All systems reviewed and negative except as noted in the HPI.   Past Surgical History:  Procedure Laterality Date   ARTERIOVENOUS GRAFT PLACEMENT  2011   COLONOSCOPY N/A 02/04/2014   Procedure: COLONOSCOPY;  Surgeon: [name], MD;  [facility];  Service: Endoscopy;  Laterality: N/A;  12:00   COLONOSCOPY N/A 07/03/2019   Procedure: COLONOSCOPY;  Surgeon: [name], MD;  [facility];  Service: Endoscopy;  Laterality: N/A;  12:00-office rescheduled  2/9 @ 11:00am   POLYPECTOMY  07/03/2019   Procedure: POLYPECTOMY;  Surgeon: [name], MD;  [facility];  Service: Endoscopy;;     Family History  Problem Relation Age of Onset   Heart disease Father    Diabetes Sister    Hypertension Sister    Diabetes Sister    Hypertension Sister    Kidney disease Mother    Hypertension Mother    Hypertension Sister    Hypertension Brother    Hypertension Brother    Hypertension Brother    Colon cancer Neg Hx      Social History   Socioeconomic History   Marital status: Single    Spouse name: Not on file   Number of children: 3   Years of education: Not on file   Highest education level: Not on file  Occupational History   Occupation: Disability  Tobacco Use    Smoking status: Every Day    Packs/day: 0.50    Years: 25.00    Total pack years: 12.50    Types: Cigarettes    Start date: 09/23/1985   Smokeless tobacco: Never   Tobacco comments:    8-10 ciggs per day   Vaping Use   Vaping Use: Never used  Substance and Sexual Activity   Alcohol use: No    Comment: quit etoh about 2011. used to drink about 8-10 beers a week   Drug use: No   Sexual activity: Not on file  Other Topics Concern   Not on file  Social History Narrative   Not on file   Social Determinants of Health   Financial Resource Strain: Not on file  Food Insecurity: Not on file  Transportation Needs: Not on file  Physical Activity: Not on file  Stress: Not on file  Social Connections: Not on file  Intimate Partner Violence: Not on file     BP (!) 124/90   Pulse 83   Ht 6' (1.829 m)   Wt 262 lb (118.8 kg)   SpO2 97%   BMI 35.53 kg/m   Physical Exam:  Well appearing NAD HEENT: Unremarkable Neck:  No JVD, no thyromegally Lymphatics:  No adenopathy Back:  No CVA tenderness Lungs:  Clear HEART:  Regular rate rhythm, no murmurs, no rubs, no clicks Abd:  soft, positive bowel sounds, no organomegally, no rebound, no guarding Ext:  2 plus pulses, no edema, no cyanosis, no clubbing Skin:  No rashes no nodules Neuro:  CN II through XII intact, motor grossly intact  EKG - reviewed. NSR with no pre-excitation   Assess/Plan:  SVT - I have discussed the treatment options with the patient. Catheter ablation was reviewed and offered. He would like to continue with his beta blocker and reserve ablation if his symptoms return or if he becomes intolerant.  HTN - continue beta blocker and other meds. ESRD - he is tolerating HD except when he goies into SVT.  Carleene Overlie Val[place]a Slabach,MD

## 2022-01-26 NOTE — Patient Instructions (Signed)
Medication Instructions:  Your physician recommends that you continue on your current medications as directed. Please refer to the Current Medication list given to you today.  Call office if you would like to have an Ablation   *If you need a refill on your cardiac medications before your next appointment, please call your pharmacy*   Lab Work: NONE   If you have labs (blood work) drawn today and your tests are completely normal, you will receive your results only by: [place] (if you have MyChart) OR A paper copy in the mail If you have any lab test that is abnormal or we need to change your treatment, we will call you to review the results.   Testing/Procedures: NONE    Follow-Up: At [facility], you and your health needs are our priority.  As part of our continuing mission to provide you with exceptional heart care, we have created designated Provider Care Teams.  These Care Teams include your primary Cardiologist (physician) and Advanced Practice Providers (APPs -  Physician Assistants and Nurse Practitioners) who all work together to provide you with the care you need, when you need it.  We recommend signing up for the patient portal called "MyChart".  Sign up information is provided on this After Visit Summary.  MyChart is used to connect with patients for Virtual Visits (Telemedicine).  Patients are able to view lab/test results, encounter notes, upcoming appointments, etc.  Non-urgent messages can be sent to your provider as well.   To learn more about what you can do with MyChart, go to [email or web address].    Your next appointment:    As Needed   The format for your next appointment:   In Person  Provider:   Cristopher Peru, MD    Other Instructions Thank you for choosing [facility]!    Important Information About Sugar

## 2022-01-27 DIAGNOSIS — N186 End stage renal disease: Secondary | ICD-10-CM | POA: Diagnosis not present

## 2022-01-27 DIAGNOSIS — Z992 Dependence on renal dialysis: Secondary | ICD-10-CM | POA: Diagnosis not present

## 2022-01-29 DIAGNOSIS — N186 End stage renal disease: Secondary | ICD-10-CM | POA: Diagnosis not present

## 2022-01-29 DIAGNOSIS — Z992 Dependence on renal dialysis: Secondary | ICD-10-CM | POA: Diagnosis not present

## 2022-02-01 DIAGNOSIS — N186 End stage renal disease: Secondary | ICD-10-CM | POA: Diagnosis not present

## 2022-02-01 DIAGNOSIS — Z992 Dependence on renal dialysis: Secondary | ICD-10-CM | POA: Diagnosis not present

## 2022-02-03 DIAGNOSIS — N186 End stage renal disease: Secondary | ICD-10-CM | POA: Diagnosis not present

## 2022-02-03 DIAGNOSIS — Z992 Dependence on renal dialysis: Secondary | ICD-10-CM | POA: Diagnosis not present

## 2022-02-04 DIAGNOSIS — Z79891 Long term (current) use of opiate analgesic: Secondary | ICD-10-CM | POA: Diagnosis not present

## 2022-02-04 DIAGNOSIS — M545 Low back pain, unspecified: Secondary | ICD-10-CM | POA: Diagnosis not present

## 2022-02-04 DIAGNOSIS — M25512 Pain in left shoulder: Secondary | ICD-10-CM | POA: Diagnosis not present

## 2022-02-04 DIAGNOSIS — M25569 Pain in unspecified knee: Secondary | ICD-10-CM | POA: Diagnosis not present

## 2022-02-05 DIAGNOSIS — N186 End stage renal disease: Secondary | ICD-10-CM | POA: Diagnosis not present

## 2022-02-05 DIAGNOSIS — Z992 Dependence on renal dialysis: Secondary | ICD-10-CM | POA: Diagnosis not present

## 2022-02-08 DIAGNOSIS — N186 End stage renal disease: Secondary | ICD-10-CM | POA: Diagnosis not present

## 2022-02-08 DIAGNOSIS — Z992 Dependence on renal dialysis: Secondary | ICD-10-CM | POA: Diagnosis not present

## 2022-02-10 DIAGNOSIS — Z992 Dependence on renal dialysis: Secondary | ICD-10-CM | POA: Diagnosis not present

## 2022-02-10 DIAGNOSIS — N186 End stage renal disease: Secondary | ICD-10-CM | POA: Diagnosis not present

## 2022-02-12 DIAGNOSIS — Z992 Dependence on renal dialysis: Secondary | ICD-10-CM | POA: Diagnosis not present

## 2022-02-12 DIAGNOSIS — N186 End stage renal disease: Secondary | ICD-10-CM | POA: Diagnosis not present

## 2022-02-15 DIAGNOSIS — N186 End stage renal disease: Secondary | ICD-10-CM | POA: Diagnosis not present

## 2022-02-15 DIAGNOSIS — Z992 Dependence on renal dialysis: Secondary | ICD-10-CM | POA: Diagnosis not present

## 2022-02-17 DIAGNOSIS — Z992 Dependence on renal dialysis: Secondary | ICD-10-CM | POA: Diagnosis not present

## 2022-02-17 DIAGNOSIS — N186 End stage renal disease: Secondary | ICD-10-CM | POA: Diagnosis not present

## 2022-02-19 DIAGNOSIS — N186 End stage renal disease: Secondary | ICD-10-CM | POA: Diagnosis not present

## 2022-02-19 DIAGNOSIS — Z992 Dependence on renal dialysis: Secondary | ICD-10-CM | POA: Diagnosis not present

## 2022-02-20 DIAGNOSIS — N186 End stage renal disease: Secondary | ICD-10-CM | POA: Diagnosis not present

## 2022-02-20 DIAGNOSIS — Z992 Dependence on renal dialysis: Secondary | ICD-10-CM | POA: Diagnosis not present

## 2022-02-21 DIAGNOSIS — Z23 Encounter for immunization: Secondary | ICD-10-CM | POA: Diagnosis not present

## 2022-02-21 DIAGNOSIS — N186 End stage renal disease: Secondary | ICD-10-CM | POA: Diagnosis not present

## 2022-02-21 DIAGNOSIS — Z992 Dependence on renal dialysis: Secondary | ICD-10-CM | POA: Diagnosis not present

## 2022-02-22 DIAGNOSIS — Z992 Dependence on renal dialysis: Secondary | ICD-10-CM | POA: Diagnosis not present

## 2022-02-22 DIAGNOSIS — N186 End stage renal disease: Secondary | ICD-10-CM | POA: Diagnosis not present

## 2022-02-22 DIAGNOSIS — Z23 Encounter for immunization: Secondary | ICD-10-CM | POA: Diagnosis not present

## 2022-02-24 DIAGNOSIS — Z992 Dependence on renal dialysis: Secondary | ICD-10-CM | POA: Diagnosis not present

## 2022-02-24 DIAGNOSIS — Z23 Encounter for immunization: Secondary | ICD-10-CM | POA: Diagnosis not present

## 2022-02-24 DIAGNOSIS — N186 End stage renal disease: Secondary | ICD-10-CM | POA: Diagnosis not present

## 2022-02-26 DIAGNOSIS — Z992 Dependence on renal dialysis: Secondary | ICD-10-CM | POA: Diagnosis not present

## 2022-02-26 DIAGNOSIS — N186 End stage renal disease: Secondary | ICD-10-CM | POA: Diagnosis not present

## 2022-02-26 DIAGNOSIS — Z23 Encounter for immunization: Secondary | ICD-10-CM | POA: Diagnosis not present

## 2022-03-01 DIAGNOSIS — Z23 Encounter for immunization: Secondary | ICD-10-CM | POA: Diagnosis not present

## 2022-03-01 DIAGNOSIS — N186 End stage renal disease: Secondary | ICD-10-CM | POA: Diagnosis not present

## 2022-03-01 DIAGNOSIS — Z992 Dependence on renal dialysis: Secondary | ICD-10-CM | POA: Diagnosis not present

## 2022-03-03 DIAGNOSIS — Z992 Dependence on renal dialysis: Secondary | ICD-10-CM | POA: Diagnosis not present

## 2022-03-03 DIAGNOSIS — Z23 Encounter for immunization: Secondary | ICD-10-CM | POA: Diagnosis not present

## 2022-03-03 DIAGNOSIS — N186 End stage renal disease: Secondary | ICD-10-CM | POA: Diagnosis not present

## 2022-03-05 DIAGNOSIS — Z23 Encounter for immunization: Secondary | ICD-10-CM | POA: Diagnosis not present

## 2022-03-05 DIAGNOSIS — Z992 Dependence on renal dialysis: Secondary | ICD-10-CM | POA: Diagnosis not present

## 2022-03-05 DIAGNOSIS — N186 End stage renal disease: Secondary | ICD-10-CM | POA: Diagnosis not present

## 2022-03-08 DIAGNOSIS — Z992 Dependence on renal dialysis: Secondary | ICD-10-CM | POA: Diagnosis not present

## 2022-03-08 DIAGNOSIS — N186 End stage renal disease: Secondary | ICD-10-CM | POA: Diagnosis not present

## 2022-03-08 DIAGNOSIS — Z23 Encounter for immunization: Secondary | ICD-10-CM | POA: Diagnosis not present

## 2022-03-10 DIAGNOSIS — Z23 Encounter for immunization: Secondary | ICD-10-CM | POA: Diagnosis not present

## 2022-03-10 DIAGNOSIS — Z992 Dependence on renal dialysis: Secondary | ICD-10-CM | POA: Diagnosis not present

## 2022-03-10 DIAGNOSIS — N186 End stage renal disease: Secondary | ICD-10-CM | POA: Diagnosis not present

## 2022-03-11 DIAGNOSIS — N186 End stage renal disease: Secondary | ICD-10-CM | POA: Diagnosis not present

## 2022-03-11 DIAGNOSIS — J449 Chronic obstructive pulmonary disease, unspecified: Secondary | ICD-10-CM | POA: Diagnosis not present

## 2022-03-11 DIAGNOSIS — I5032 Chronic diastolic (congestive) heart failure: Secondary | ICD-10-CM | POA: Diagnosis not present

## 2022-03-11 DIAGNOSIS — E7849 Other hyperlipidemia: Secondary | ICD-10-CM | POA: Diagnosis not present

## 2022-03-11 DIAGNOSIS — R030 Elevated blood-pressure reading, without diagnosis of hypertension: Secondary | ICD-10-CM | POA: Diagnosis not present

## 2022-03-11 DIAGNOSIS — F17210 Nicotine dependence, cigarettes, uncomplicated: Secondary | ICD-10-CM | POA: Diagnosis not present

## 2022-03-11 DIAGNOSIS — I700 Atherosclerosis of aorta: Secondary | ICD-10-CM | POA: Diagnosis not present

## 2022-03-11 DIAGNOSIS — Z992 Dependence on renal dialysis: Secondary | ICD-10-CM | POA: Diagnosis not present

## 2022-03-11 DIAGNOSIS — I2510 Atherosclerotic heart disease of native coronary artery without angina pectoris: Secondary | ICD-10-CM | POA: Diagnosis not present

## 2022-03-11 DIAGNOSIS — I471 Supraventricular tachycardia, unspecified: Secondary | ICD-10-CM | POA: Diagnosis not present

## 2022-03-11 DIAGNOSIS — I120 Hypertensive chronic kidney disease with stage 5 chronic kidney disease or end stage renal disease: Secondary | ICD-10-CM | POA: Diagnosis not present

## 2022-03-12 DIAGNOSIS — Z23 Encounter for immunization: Secondary | ICD-10-CM | POA: Diagnosis not present

## 2022-03-12 DIAGNOSIS — Z992 Dependence on renal dialysis: Secondary | ICD-10-CM | POA: Diagnosis not present

## 2022-03-12 DIAGNOSIS — N186 End stage renal disease: Secondary | ICD-10-CM | POA: Diagnosis not present

## 2022-03-15 DIAGNOSIS — N186 End stage renal disease: Secondary | ICD-10-CM | POA: Diagnosis not present

## 2022-03-15 DIAGNOSIS — Z23 Encounter for immunization: Secondary | ICD-10-CM | POA: Diagnosis not present

## 2022-03-15 DIAGNOSIS — Z992 Dependence on renal dialysis: Secondary | ICD-10-CM | POA: Diagnosis not present

## 2022-03-17 DIAGNOSIS — N186 End stage renal disease: Secondary | ICD-10-CM | POA: Diagnosis not present

## 2022-03-17 DIAGNOSIS — Z992 Dependence on renal dialysis: Secondary | ICD-10-CM | POA: Diagnosis not present

## 2022-03-17 DIAGNOSIS — Z23 Encounter for immunization: Secondary | ICD-10-CM | POA: Diagnosis not present

## 2022-03-19 DIAGNOSIS — N186 End stage renal disease: Secondary | ICD-10-CM | POA: Diagnosis not present

## 2022-03-19 DIAGNOSIS — Z992 Dependence on renal dialysis: Secondary | ICD-10-CM | POA: Diagnosis not present

## 2022-03-19 DIAGNOSIS — Z23 Encounter for immunization: Secondary | ICD-10-CM | POA: Diagnosis not present

## 2022-03-22 DIAGNOSIS — Z992 Dependence on renal dialysis: Secondary | ICD-10-CM | POA: Diagnosis not present

## 2022-03-22 DIAGNOSIS — Z23 Encounter for immunization: Secondary | ICD-10-CM | POA: Diagnosis not present

## 2022-03-22 DIAGNOSIS — N186 End stage renal disease: Secondary | ICD-10-CM | POA: Diagnosis not present

## 2022-03-23 DIAGNOSIS — N186 End stage renal disease: Secondary | ICD-10-CM | POA: Diagnosis not present

## 2022-03-23 DIAGNOSIS — Z992 Dependence on renal dialysis: Secondary | ICD-10-CM | POA: Diagnosis not present

## 2022-03-24 DIAGNOSIS — Z992 Dependence on renal dialysis: Secondary | ICD-10-CM | POA: Diagnosis not present

## 2022-03-24 DIAGNOSIS — N186 End stage renal disease: Secondary | ICD-10-CM | POA: Diagnosis not present

## 2022-03-26 DIAGNOSIS — Z992 Dependence on renal dialysis: Secondary | ICD-10-CM | POA: Diagnosis not present

## 2022-03-26 DIAGNOSIS — N186 End stage renal disease: Secondary | ICD-10-CM | POA: Diagnosis not present

## 2022-03-29 DIAGNOSIS — N186 End stage renal disease: Secondary | ICD-10-CM | POA: Diagnosis not present

## 2022-03-29 DIAGNOSIS — Z992 Dependence on renal dialysis: Secondary | ICD-10-CM | POA: Diagnosis not present

## 2022-03-31 DIAGNOSIS — Z992 Dependence on renal dialysis: Secondary | ICD-10-CM | POA: Diagnosis not present

## 2022-03-31 DIAGNOSIS — N186 End stage renal disease: Secondary | ICD-10-CM | POA: Diagnosis not present

## 2022-04-02 DIAGNOSIS — Z992 Dependence on renal dialysis: Secondary | ICD-10-CM | POA: Diagnosis not present

## 2022-04-02 DIAGNOSIS — N186 End stage renal disease: Secondary | ICD-10-CM | POA: Diagnosis not present

## 2022-04-04 DIAGNOSIS — N186 End stage renal disease: Secondary | ICD-10-CM | POA: Diagnosis not present

## 2022-04-04 DIAGNOSIS — Z992 Dependence on renal dialysis: Secondary | ICD-10-CM | POA: Diagnosis not present

## 2022-04-05 DIAGNOSIS — N186 End stage renal disease: Secondary | ICD-10-CM | POA: Diagnosis not present

## 2022-04-05 DIAGNOSIS — Z992 Dependence on renal dialysis: Secondary | ICD-10-CM | POA: Diagnosis not present

## 2022-04-07 DIAGNOSIS — N186 End stage renal disease: Secondary | ICD-10-CM | POA: Diagnosis not present

## 2022-04-07 DIAGNOSIS — Z992 Dependence on renal dialysis: Secondary | ICD-10-CM | POA: Diagnosis not present

## 2022-04-09 DIAGNOSIS — N186 End stage renal disease: Secondary | ICD-10-CM | POA: Diagnosis not present

## 2022-04-09 DIAGNOSIS — Z992 Dependence on renal dialysis: Secondary | ICD-10-CM | POA: Diagnosis not present

## 2022-04-12 DIAGNOSIS — Z992 Dependence on renal dialysis: Secondary | ICD-10-CM | POA: Diagnosis not present

## 2022-04-12 DIAGNOSIS — N186 End stage renal disease: Secondary | ICD-10-CM | POA: Diagnosis not present

## 2022-04-14 DIAGNOSIS — Z992 Dependence on renal dialysis: Secondary | ICD-10-CM | POA: Diagnosis not present

## 2022-04-14 DIAGNOSIS — N186 End stage renal disease: Secondary | ICD-10-CM | POA: Diagnosis not present

## 2022-04-16 DIAGNOSIS — N186 End stage renal disease: Secondary | ICD-10-CM | POA: Diagnosis not present

## 2022-04-16 DIAGNOSIS — Z992 Dependence on renal dialysis: Secondary | ICD-10-CM | POA: Diagnosis not present

## 2022-04-19 DIAGNOSIS — Z992 Dependence on renal dialysis: Secondary | ICD-10-CM | POA: Diagnosis not present

## 2022-04-19 DIAGNOSIS — N186 End stage renal disease: Secondary | ICD-10-CM | POA: Diagnosis not present

## 2022-04-21 DIAGNOSIS — N186 End stage renal disease: Secondary | ICD-10-CM | POA: Diagnosis not present

## 2022-04-21 DIAGNOSIS — Z992 Dependence on renal dialysis: Secondary | ICD-10-CM | POA: Diagnosis not present

## 2022-04-22 DIAGNOSIS — N186 End stage renal disease: Secondary | ICD-10-CM | POA: Diagnosis not present

## 2022-04-22 DIAGNOSIS — Z992 Dependence on renal dialysis: Secondary | ICD-10-CM | POA: Diagnosis not present

## 2022-04-23 DIAGNOSIS — Z992 Dependence on renal dialysis: Secondary | ICD-10-CM | POA: Diagnosis not present

## 2022-04-23 DIAGNOSIS — N186 End stage renal disease: Secondary | ICD-10-CM | POA: Diagnosis not present

## 2022-04-26 DIAGNOSIS — N186 End stage renal disease: Secondary | ICD-10-CM | POA: Diagnosis not present

## 2022-04-26 DIAGNOSIS — Z992 Dependence on renal dialysis: Secondary | ICD-10-CM | POA: Diagnosis not present

## 2022-04-28 DIAGNOSIS — Z992 Dependence on renal dialysis: Secondary | ICD-10-CM | POA: Diagnosis not present

## 2022-04-28 DIAGNOSIS — N186 End stage renal disease: Secondary | ICD-10-CM | POA: Diagnosis not present

## 2022-04-29 DIAGNOSIS — N08 Glomerular disorders in diseases classified elsewhere: Secondary | ICD-10-CM | POA: Diagnosis not present

## 2022-04-29 DIAGNOSIS — M545 Low back pain, unspecified: Secondary | ICD-10-CM | POA: Diagnosis not present

## 2022-04-29 DIAGNOSIS — M25569 Pain in unspecified knee: Secondary | ICD-10-CM | POA: Diagnosis not present

## 2022-04-29 DIAGNOSIS — Z8679 Personal history of other diseases of the circulatory system: Secondary | ICD-10-CM | POA: Diagnosis not present

## 2022-04-29 DIAGNOSIS — M25512 Pain in left shoulder: Secondary | ICD-10-CM | POA: Diagnosis not present

## 2022-04-29 DIAGNOSIS — Z79891 Long term (current) use of opiate analgesic: Secondary | ICD-10-CM | POA: Diagnosis not present

## 2022-04-30 DIAGNOSIS — Z992 Dependence on renal dialysis: Secondary | ICD-10-CM | POA: Diagnosis not present

## 2022-04-30 DIAGNOSIS — N186 End stage renal disease: Secondary | ICD-10-CM | POA: Diagnosis not present

## 2022-05-03 DIAGNOSIS — Z992 Dependence on renal dialysis: Secondary | ICD-10-CM | POA: Diagnosis not present

## 2022-05-03 DIAGNOSIS — N186 End stage renal disease: Secondary | ICD-10-CM | POA: Diagnosis not present

## 2022-05-04 DIAGNOSIS — Z992 Dependence on renal dialysis: Secondary | ICD-10-CM | POA: Diagnosis not present

## 2022-05-04 DIAGNOSIS — N186 End stage renal disease: Secondary | ICD-10-CM | POA: Diagnosis not present

## 2022-05-05 DIAGNOSIS — Z992 Dependence on renal dialysis: Secondary | ICD-10-CM | POA: Diagnosis not present

## 2022-05-05 DIAGNOSIS — N186 End stage renal disease: Secondary | ICD-10-CM | POA: Diagnosis not present

## 2022-05-07 DIAGNOSIS — N186 End stage renal disease: Secondary | ICD-10-CM | POA: Diagnosis not present

## 2022-05-07 DIAGNOSIS — Z992 Dependence on renal dialysis: Secondary | ICD-10-CM | POA: Diagnosis not present

## 2022-05-10 DIAGNOSIS — N186 End stage renal disease: Secondary | ICD-10-CM | POA: Diagnosis not present

## 2022-05-10 DIAGNOSIS — Z992 Dependence on renal dialysis: Secondary | ICD-10-CM | POA: Diagnosis not present

## 2022-05-12 DIAGNOSIS — N186 End stage renal disease: Secondary | ICD-10-CM | POA: Diagnosis not present

## 2022-05-12 DIAGNOSIS — Z992 Dependence on renal dialysis: Secondary | ICD-10-CM | POA: Diagnosis not present

## 2022-05-15 DIAGNOSIS — N186 End stage renal disease: Secondary | ICD-10-CM | POA: Diagnosis not present

## 2022-05-15 DIAGNOSIS — Z992 Dependence on renal dialysis: Secondary | ICD-10-CM | POA: Diagnosis not present

## 2022-05-19 DIAGNOSIS — N186 End stage renal disease: Secondary | ICD-10-CM | POA: Diagnosis not present

## 2022-05-19 DIAGNOSIS — Z992 Dependence on renal dialysis: Secondary | ICD-10-CM | POA: Diagnosis not present

## 2022-05-21 DIAGNOSIS — N186 End stage renal disease: Secondary | ICD-10-CM | POA: Diagnosis not present

## 2022-05-21 DIAGNOSIS — Z992 Dependence on renal dialysis: Secondary | ICD-10-CM | POA: Diagnosis not present

## 2022-05-23 DIAGNOSIS — N186 End stage renal disease: Secondary | ICD-10-CM | POA: Diagnosis not present

## 2022-05-23 DIAGNOSIS — Z992 Dependence on renal dialysis: Secondary | ICD-10-CM | POA: Diagnosis not present

## 2022-05-24 DIAGNOSIS — Z992 Dependence on renal dialysis: Secondary | ICD-10-CM | POA: Diagnosis not present

## 2022-05-24 DIAGNOSIS — N186 End stage renal disease: Secondary | ICD-10-CM | POA: Diagnosis not present

## 2022-05-25 DIAGNOSIS — M542 Cervicalgia: Secondary | ICD-10-CM | POA: Diagnosis not present

## 2022-05-25 DIAGNOSIS — G894 Chronic pain syndrome: Secondary | ICD-10-CM | POA: Diagnosis not present

## 2022-05-25 DIAGNOSIS — Z79899 Other long term (current) drug therapy: Secondary | ICD-10-CM | POA: Diagnosis not present

## 2022-05-25 DIAGNOSIS — M25569 Pain in unspecified knee: Secondary | ICD-10-CM | POA: Diagnosis not present

## 2022-05-25 DIAGNOSIS — Z79891 Long term (current) use of opiate analgesic: Secondary | ICD-10-CM | POA: Diagnosis not present

## 2022-05-25 DIAGNOSIS — M25511 Pain in right shoulder: Secondary | ICD-10-CM | POA: Diagnosis not present

## 2022-05-26 DIAGNOSIS — Z992 Dependence on renal dialysis: Secondary | ICD-10-CM | POA: Diagnosis not present

## 2022-05-26 DIAGNOSIS — N186 End stage renal disease: Secondary | ICD-10-CM | POA: Diagnosis not present

## 2022-05-28 DIAGNOSIS — N186 End stage renal disease: Secondary | ICD-10-CM | POA: Diagnosis not present

## 2022-05-28 DIAGNOSIS — Z992 Dependence on renal dialysis: Secondary | ICD-10-CM | POA: Diagnosis not present

## 2022-05-31 DIAGNOSIS — Z992 Dependence on renal dialysis: Secondary | ICD-10-CM | POA: Diagnosis not present

## 2022-05-31 DIAGNOSIS — N186 End stage renal disease: Secondary | ICD-10-CM | POA: Diagnosis not present

## 2022-06-02 DIAGNOSIS — Z992 Dependence on renal dialysis: Secondary | ICD-10-CM | POA: Diagnosis not present

## 2022-06-02 DIAGNOSIS — N186 End stage renal disease: Secondary | ICD-10-CM | POA: Diagnosis not present

## 2022-06-03 DIAGNOSIS — Z992 Dependence on renal dialysis: Secondary | ICD-10-CM | POA: Diagnosis not present

## 2022-06-03 DIAGNOSIS — N186 End stage renal disease: Secondary | ICD-10-CM | POA: Diagnosis not present

## 2022-06-04 DIAGNOSIS — N186 End stage renal disease: Secondary | ICD-10-CM | POA: Diagnosis not present

## 2022-06-04 DIAGNOSIS — Z992 Dependence on renal dialysis: Secondary | ICD-10-CM | POA: Diagnosis not present

## 2022-06-07 DIAGNOSIS — N186 End stage renal disease: Secondary | ICD-10-CM | POA: Diagnosis not present

## 2022-06-07 DIAGNOSIS — Z992 Dependence on renal dialysis: Secondary | ICD-10-CM | POA: Diagnosis not present

## 2022-06-08 DIAGNOSIS — Z79891 Long term (current) use of opiate analgesic: Secondary | ICD-10-CM | POA: Diagnosis not present

## 2022-06-08 DIAGNOSIS — M549 Dorsalgia, unspecified: Secondary | ICD-10-CM | POA: Diagnosis not present

## 2022-06-08 DIAGNOSIS — M25511 Pain in right shoulder: Secondary | ICD-10-CM | POA: Diagnosis not present

## 2022-06-08 DIAGNOSIS — M542 Cervicalgia: Secondary | ICD-10-CM | POA: Diagnosis not present

## 2022-06-08 DIAGNOSIS — M25512 Pain in left shoulder: Secondary | ICD-10-CM | POA: Diagnosis not present

## 2022-06-08 DIAGNOSIS — G894 Chronic pain syndrome: Secondary | ICD-10-CM | POA: Diagnosis not present

## 2022-06-08 DIAGNOSIS — M25569 Pain in unspecified knee: Secondary | ICD-10-CM | POA: Diagnosis not present

## 2022-06-09 DIAGNOSIS — Z992 Dependence on renal dialysis: Secondary | ICD-10-CM | POA: Diagnosis not present

## 2022-06-09 DIAGNOSIS — N186 End stage renal disease: Secondary | ICD-10-CM | POA: Diagnosis not present

## 2022-06-11 DIAGNOSIS — N186 End stage renal disease: Secondary | ICD-10-CM | POA: Diagnosis not present

## 2022-06-11 DIAGNOSIS — Z992 Dependence on renal dialysis: Secondary | ICD-10-CM | POA: Diagnosis not present

## 2022-06-14 DIAGNOSIS — Z992 Dependence on renal dialysis: Secondary | ICD-10-CM | POA: Diagnosis not present

## 2022-06-14 DIAGNOSIS — N186 End stage renal disease: Secondary | ICD-10-CM | POA: Diagnosis not present

## 2022-06-16 DIAGNOSIS — N186 End stage renal disease: Secondary | ICD-10-CM | POA: Diagnosis not present

## 2022-06-16 DIAGNOSIS — Z992 Dependence on renal dialysis: Secondary | ICD-10-CM | POA: Diagnosis not present

## 2022-06-18 DIAGNOSIS — N186 End stage renal disease: Secondary | ICD-10-CM | POA: Diagnosis not present

## 2022-06-18 DIAGNOSIS — Z992 Dependence on renal dialysis: Secondary | ICD-10-CM | POA: Diagnosis not present

## 2022-06-21 DIAGNOSIS — Z992 Dependence on renal dialysis: Secondary | ICD-10-CM | POA: Diagnosis not present

## 2022-06-21 DIAGNOSIS — N186 End stage renal disease: Secondary | ICD-10-CM | POA: Diagnosis not present

## 2022-06-22 DIAGNOSIS — J449 Chronic obstructive pulmonary disease, unspecified: Secondary | ICD-10-CM | POA: Diagnosis not present

## 2022-06-22 DIAGNOSIS — Z13220 Encounter for screening for lipoid disorders: Secondary | ICD-10-CM | POA: Diagnosis not present

## 2022-06-22 DIAGNOSIS — E039 Hypothyroidism, unspecified: Secondary | ICD-10-CM | POA: Diagnosis not present

## 2022-06-22 DIAGNOSIS — D519 Vitamin B12 deficiency anemia, unspecified: Secondary | ICD-10-CM | POA: Diagnosis not present

## 2022-06-22 DIAGNOSIS — E7849 Other hyperlipidemia: Secondary | ICD-10-CM | POA: Diagnosis not present

## 2022-06-23 DIAGNOSIS — J449 Chronic obstructive pulmonary disease, unspecified: Secondary | ICD-10-CM | POA: Diagnosis not present

## 2022-06-23 DIAGNOSIS — I5032 Chronic diastolic (congestive) heart failure: Secondary | ICD-10-CM | POA: Diagnosis not present

## 2022-06-23 DIAGNOSIS — I120 Hypertensive chronic kidney disease with stage 5 chronic kidney disease or end stage renal disease: Secondary | ICD-10-CM | POA: Diagnosis not present

## 2022-06-23 DIAGNOSIS — Z992 Dependence on renal dialysis: Secondary | ICD-10-CM | POA: Diagnosis not present

## 2022-06-23 DIAGNOSIS — N186 End stage renal disease: Secondary | ICD-10-CM | POA: Diagnosis not present

## 2022-06-23 DIAGNOSIS — E782 Mixed hyperlipidemia: Secondary | ICD-10-CM | POA: Diagnosis not present

## 2022-06-24 DIAGNOSIS — Z992 Dependence on renal dialysis: Secondary | ICD-10-CM | POA: Diagnosis not present

## 2022-06-24 DIAGNOSIS — N186 End stage renal disease: Secondary | ICD-10-CM | POA: Diagnosis not present

## 2022-06-25 DIAGNOSIS — Z992 Dependence on renal dialysis: Secondary | ICD-10-CM | POA: Diagnosis not present

## 2022-06-25 DIAGNOSIS — N186 End stage renal disease: Secondary | ICD-10-CM | POA: Diagnosis not present

## 2022-06-28 DIAGNOSIS — N186 End stage renal disease: Secondary | ICD-10-CM | POA: Diagnosis not present

## 2022-06-28 DIAGNOSIS — Z992 Dependence on renal dialysis: Secondary | ICD-10-CM | POA: Diagnosis not present

## 2022-06-29 DIAGNOSIS — M19012 Primary osteoarthritis, left shoulder: Secondary | ICD-10-CM | POA: Diagnosis not present

## 2022-06-29 DIAGNOSIS — I2510 Atherosclerotic heart disease of native coronary artery without angina pectoris: Secondary | ICD-10-CM | POA: Diagnosis not present

## 2022-06-29 DIAGNOSIS — R030 Elevated blood-pressure reading, without diagnosis of hypertension: Secondary | ICD-10-CM | POA: Diagnosis not present

## 2022-06-29 DIAGNOSIS — Z23 Encounter for immunization: Secondary | ICD-10-CM | POA: Diagnosis not present

## 2022-06-29 DIAGNOSIS — F17210 Nicotine dependence, cigarettes, uncomplicated: Secondary | ICD-10-CM | POA: Diagnosis not present

## 2022-06-29 DIAGNOSIS — I120 Hypertensive chronic kidney disease with stage 5 chronic kidney disease or end stage renal disease: Secondary | ICD-10-CM | POA: Diagnosis not present

## 2022-06-29 DIAGNOSIS — M25812 Other specified joint disorders, left shoulder: Secondary | ICD-10-CM | POA: Diagnosis not present

## 2022-06-29 DIAGNOSIS — E7849 Other hyperlipidemia: Secondary | ICD-10-CM | POA: Diagnosis not present

## 2022-06-29 DIAGNOSIS — Z0001 Encounter for general adult medical examination with abnormal findings: Secondary | ICD-10-CM | POA: Diagnosis not present

## 2022-06-29 DIAGNOSIS — I471 Supraventricular tachycardia, unspecified: Secondary | ICD-10-CM | POA: Diagnosis not present

## 2022-06-29 DIAGNOSIS — J449 Chronic obstructive pulmonary disease, unspecified: Secondary | ICD-10-CM | POA: Diagnosis not present

## 2022-06-29 DIAGNOSIS — I700 Atherosclerosis of aorta: Secondary | ICD-10-CM | POA: Diagnosis not present

## 2022-06-30 DIAGNOSIS — N186 End stage renal disease: Secondary | ICD-10-CM | POA: Diagnosis not present

## 2022-06-30 DIAGNOSIS — Z992 Dependence on renal dialysis: Secondary | ICD-10-CM | POA: Diagnosis not present

## 2022-07-02 DIAGNOSIS — Z992 Dependence on renal dialysis: Secondary | ICD-10-CM | POA: Diagnosis not present

## 2022-07-02 DIAGNOSIS — N186 End stage renal disease: Secondary | ICD-10-CM | POA: Diagnosis not present

## 2022-07-03 DIAGNOSIS — Z992 Dependence on renal dialysis: Secondary | ICD-10-CM | POA: Diagnosis not present

## 2022-07-03 DIAGNOSIS — N186 End stage renal disease: Secondary | ICD-10-CM | POA: Diagnosis not present

## 2022-07-05 ENCOUNTER — Encounter (HOSPITAL_COMMUNITY): Payer: Self-pay

## 2022-07-05 ENCOUNTER — Other Ambulatory Visit: Payer: Self-pay

## 2022-07-05 ENCOUNTER — Emergency Department (HOSPITAL_COMMUNITY): Payer: 59

## 2022-07-05 ENCOUNTER — Emergency Department (HOSPITAL_COMMUNITY)
Admission: EM | Admit: 2022-07-05 | Discharge: 2022-07-05 | Disposition: A | Discharge status: Home / Self Care | Payer: 59 | Attending: Emergency Medicine | Admitting: Emergency Medicine

## 2022-07-05 DIAGNOSIS — J449 Chronic obstructive pulmonary disease, unspecified: Secondary | ICD-10-CM | POA: Diagnosis not present

## 2022-07-05 DIAGNOSIS — R000 Tachycardia, unspecified: Secondary | ICD-10-CM | POA: Diagnosis present

## 2022-07-05 DIAGNOSIS — J811 Chronic pulmonary edema: Secondary | ICD-10-CM | POA: Diagnosis not present

## 2022-07-05 DIAGNOSIS — I132 Hypertensive heart and chronic kidney disease with heart failure and with stage 5 chronic kidney disease, or end stage renal disease: Secondary | ICD-10-CM | POA: Diagnosis not present

## 2022-07-05 DIAGNOSIS — R6889 Other general symptoms and signs: Secondary | ICD-10-CM | POA: Diagnosis not present

## 2022-07-05 DIAGNOSIS — Z992 Dependence on renal dialysis: Secondary | ICD-10-CM | POA: Insufficient documentation

## 2022-07-05 DIAGNOSIS — I120 Hypertensive chronic kidney disease with stage 5 chronic kidney disease or end stage renal disease: Secondary | ICD-10-CM | POA: Diagnosis not present

## 2022-07-05 DIAGNOSIS — I5030 Unspecified diastolic (congestive) heart failure: Secondary | ICD-10-CM | POA: Insufficient documentation

## 2022-07-05 DIAGNOSIS — I471 Supraventricular tachycardia, unspecified: Secondary | ICD-10-CM

## 2022-07-05 DIAGNOSIS — Z743 Need for continuous supervision: Secondary | ICD-10-CM | POA: Diagnosis not present

## 2022-07-05 DIAGNOSIS — N186 End stage renal disease: Secondary | ICD-10-CM | POA: Diagnosis not present

## 2022-07-05 DIAGNOSIS — R61 Generalized hyperhidrosis: Secondary | ICD-10-CM | POA: Diagnosis not present

## 2022-07-05 DIAGNOSIS — I499 Cardiac arrhythmia, unspecified: Secondary | ICD-10-CM | POA: Diagnosis not present

## 2022-07-05 LAB — CBC
HCT: 48.4 % (ref 39.0–52.0)
Hemoglobin: 15.2 g/dL (ref 13.0–17.0)
MCH: 30.5 pg (ref 26.0–34.0)
MCHC: 31.4 g/dL (ref 30.0–36.0)
MCV: 97 fL (ref 80.0–100.0)
Platelets: 245 10*3/uL (ref 150–400)
RBC: 4.99 MIL/uL (ref 4.22–5.81)
RDW: 16.7 % — ABNORMAL HIGH (ref 11.5–15.5)
WBC: 14.4 10*3/uL — ABNORMAL HIGH (ref 4.0–10.5)
nRBC: 0 % (ref 0.0–0.2)

## 2022-07-05 LAB — BASIC METABOLIC PANEL
Anion gap: 18 — ABNORMAL HIGH (ref 5–15)
BUN: 61 mg/dL — ABNORMAL HIGH (ref 6–20)
CO2: 23 mmol/L (ref 22–32)
Calcium: 8.3 mg/dL — ABNORMAL LOW (ref 8.9–10.3)
Chloride: 96 mmol/L — ABNORMAL LOW (ref 98–111)
Creatinine, Ser: 12.42 mg/dL — ABNORMAL HIGH (ref 0.61–1.24)
GFR, Estimated: 4 mL/min — ABNORMAL LOW (ref >60)
Glucose, Bld: 113 mg/dL — ABNORMAL HIGH (ref 70–99)
Potassium: 4.8 mmol/L (ref 3.5–5.1)
Sodium: 137 mmol/L (ref 135–145)

## 2022-07-05 LAB — MAGNESIUM: Magnesium: 2.4 mg/dL (ref 1.7–2.4)

## 2022-07-05 MED ORDER — ADENOSINE 6 MG/2ML IV SOLN
INTRAVENOUS | Status: AC
  Filled 2022-07-05: qty 2

## 2022-07-05 MED ORDER — ADENOSINE 6 MG/2ML IV SOLN
6.0000 mg | INTRAVENOUS | Status: AC
  Administered 2022-07-05: 6 mg via INTRAVENOUS

## 2022-07-05 MED ORDER — ADENOSINE 6 MG/2ML IV SOLN
INTRAVENOUS | Status: AC
  Filled 2022-07-05: qty 4

## 2022-07-05 NOTE — Discharge Instructions (Signed)
You were seen for your rapid heart rate (SVT) in the emergency department.   Follow-up with your primary doctor in 2-3 days regarding your visit.    Return immediately to the emergency department if you experience any of the following: Palpitations, dizziness, fainting, or any other concerning symptoms.    Thank you for visiting our Emergency Department. It was a pleasure taking care of you today.

## 2022-07-05 NOTE — ED Notes (Signed)
69m adenosine at this time

## 2022-07-05 NOTE — ED Provider Notes (Signed)
Physical Exam  BP (!) 89/68 (BP Location: Left Arm)   Pulse (!) 156   Temp 98.2 F (36.8 C) (Oral)   Resp 18   Wt 120.2 kg   SpO2 97%   BMI 35.94 kg/m   Physical Exam Vitals and nursing note reviewed.  Constitutional:      General: He is not in acute distress.    Appearance: He is well-developed.  HENT:     Head: Normocephalic and atraumatic.     Right Ear: External ear normal.     Left Ear: External ear normal.     Nose: Nose normal.  Eyes:     Extraocular Movements: Extraocular movements intact.     Conjunctiva/sclera: Conjunctivae normal.     Pupils: Pupils are equal, round, and reactive to light.  Cardiovascular:     Rate and Rhythm: Regular rhythm. Tachycardia present.     Heart sounds: Normal heart sounds.     Comments: Fistula over right upper extremity with bruit and thrill Pulmonary:     Effort: Pulmonary effort is normal. No respiratory distress.     Breath sounds: Normal breath sounds.  Abdominal:     Palpations: Abdomen is soft.  Musculoskeletal:     Cervical back: Normal range of motion and neck supple.  Skin:    General: Skin is warm and dry.  Neurological:     Mental Status: He is alert. Mental status is at baseline.  Psychiatric:        Mood and Affect: Mood normal.        Behavior: Behavior normal.     Procedures  .Cardioversion  Date/Time: 07/05/2022 7:42 AM  Performed by: Fransico Meadow, MD Authorized by: Fransico Meadow, MD   Pre-procedure details:    Cardioversion basis:  Emergent   Rhythm:  Supraventricular tachycardia Patient sedated: No Comments:     Pt required cardioversion for SVT. Had soft blood pressures which are at his baseline. Given 6 mg then 12 mg with successful conversion to NSR.     ED Course / MDM   Clinical Course as of 07/06/22 0606  Mon Jul 05, 2022  [id number] Assumed care from Dr Roxanne Mins. 52 yo M with pSVT and ESRD on iHD who presents with palpitations and found to be in SVT at his dialysis session today.  Labs delayed 2/2 difficulty with IV access.  On repeat evaluation is in no acute distress.  Does have soft blood pressures but patient reports that this is his baseline and is not symptomatic at all. [RP]  [phone] Dr Joelyn Oms from nephrology consulted.  States that due to capacity issues will need to reach out to [facility] dialysis first since patient does not need emergent dialysis. [RP]  [phone] Was able to set up dialysis for the patient at [place].  Called and confirmed that as long as he is there by 11 that he can have dialysis performed today. [RP]    Clinical Course User Index [RP] Fransico Meadow, MD   Medical Decision Making Amount and/or Complexity of Data Reviewed Labs: ordered. Radiology: ordered.  Risk Prescription drug management.   CRITICAL CARE Performed by: Fransico Meadow   Total critical care time: 30 minutes  Critical care time was exclusive of separately billable procedures and treating other patients.  Critical care was necessary to treat or prevent imminent or life-threatening deterioration.  Critical care was time spent personally by me on the following activities: development of treatment plan with patient and/or surrogate as well  as nursing, discussions with consultants, evaluation of patient's response to treatment, examination of patient, obtaining history from patient or surrogate, ordering and performing treatments and interventions, ordering and review of laboratory studies, ordering and review of radiographic studies, pulse oximetry and re-evaluation of patient's condition.     Fransico Meadow, MD 07/06/22 [id number]

## 2022-07-05 NOTE — ED Provider Notes (Signed)
[facility] Provider Note   CSN: [id number] Arrival date & time: 07/05/22  [id number]     History  Chief Complaint  Patient presents with   Tachycardia    Brian Barrera is a 52 y.o. male.  The history is provided by the patient and the EMS personnel.  He has history of hypertension, COPD, diastolic heart failure, end-stage renal disease on hemodialysis and came here from dialysis when he noted that his heart was racing.  He has a history of this having happened before and he states he usually gets some medication for the IV to stop it.  He denies chest pain, heaviness, tightness, pressure.  He denies dyspnea.  He came by ambulance, EMS was unable to start an IV line.,   Home Medications Prior to Admission medications   Medication Sig Start Date End Date Taking? Authorizing Provider  albuterol (ACCUNEB) 1.25 MG/3ML nebulizer solution Take 1 ampule by nebulization as needed for wheezing or shortness of breath.    [provider]  albuterol (PROAIR HFA) 108 (90 BASE) MCG/ACT inhaler Inhale 2 puffs into the lungs every 4 (four) hours as needed for wheezing. 02/11/14   [name], MD  aspirin EC 81 MG tablet Take 81 mg by mouth daily.    [provider]  atorvastatin (LIPITOR) 10 MG tablet Take 10 mg by mouth daily.    [provider]  AURYXIA 1 GM 210 MG(Fe) tablet Take 420 mg by mouth 3 (three) times daily with meals.  11/02/16   [provider]  b complex-vitamin c-folic acid (NEPHRO-VITE) 0.8 MG TABS Take 0.8 mg by mouth daily.     [provider]  budesonide-formoterol (SYMBICORT) 80-4.5 MCG/ACT inhaler Inhale 2 puffs into the lungs 2 (two) times daily. 06/05/13   [name], MD  cinacalcet (SENSIPAR) 60 MG tablet Take 60 mg by mouth daily.    [provider]  diphenhydramine-acetaminophen (TYLENOL PM) 25-500 MG TABS tablet Take 1 tablet by mouth at bedtime as needed (sleep).     [provider]  DULoxetine (CYMBALTA) 30 MG capsule Take 30 mg by mouth daily. 11/08/19   [provider]  gabapentin (NEURONTIN) 300 MG capsule Take 300 mg by mouth daily. 10/09/19   [provider]  metoprolol tartrate (LOPRESSOR) 25 MG tablet Take 1 tablet (25 mg total) by mouth 2 (two) times daily. 12/29/21   Strader, Fransisco Hertz, PA-C  Omega-3 1000 MG CAPS Take 2,000 mg by mouth daily.    [provider]  oxyCODONE-acetaminophen (PERCOCET) 7.5-325 MG tablet Take 1 tablet by mouth every 6 (six) hours as needed for moderate pain. 10/09/19   [provider]  sevelamer carbonate (RENVELA) 800 MG tablet Take 1,600-3,200 mg by mouth See admin instructions. Take 3200 mg with meals and 1600 with snack    [provider]      Allergies    Patient has no known allergies.    Review of Systems   Review of Systems  All other systems reviewed and are negative.   Physical Exam Updated Vital Signs BP (!) 89/68 (BP Location: Left Arm)   Pulse (!) 156   Temp 98.2 F (36.8 C) (Oral)   Resp 18   Wt 120.2 kg   SpO2 97%   BMI 35.94 kg/m  Physical Exam Vitals and nursing note reviewed.   52 year old male, resting comfortably and in no acute distress. Vital signs are scant for rapid  heart rate and low blood pressure. Oxygen saturation is 97%, which is normal. Head is normocephalic and atraumatic. PERRLA, EOMI. Oropharynx is clear. Neck is nontender and supple without adenopathy or JVD. Lungs are clear without rales, wheezes, or rhonchi. Chest is nontender. Heart is tachycardic without murmur. Abdomen is soft, flat, nontender without masses or hepatosplenomegaly and peristalsis is normoactive. Extremities: AV fistula present in the right forearm with thrill present.  No edema present Skin is warm and dry without rash. Neurologic: Mental status is normal, cranial nerves are intact, moves all extremities equally.  ED Results / Procedures /  Treatments   Labs (all labs ordered are listed, but only abnormal results are displayed) Labs Reviewed - No data to display  EKG EKG Interpretation  Date/Time:  Monday July 05 2022 06:52:14 EST Ventricular Rate:  157 PR Interval:    QRS Duration: 97 QT Interval:  313 QTC Calculation: 506 R Axis:   5 Text Interpretation: Supraventricular tachycardia Minimal ST depression, inferior leads Prolonged QT interval When compared with ECG of 12/28/2021, Supraventricular tachycardia has replaced Sinus rhythm Confirmed by Delora Fuel ([id number]) on 07/05/2022 6:57:16 AM  Radiology No results found.  Procedures Procedures  Cardiac monitor shows paroxysmal supraventricular tachycardia, per my interpretation.  Medications Ordered in ED Medications - No data to display  ED Course/ Medical Decision Making/ A&P Clinical Course as of 07/05/22 0709  Mon Jul 05, 2022  0704 Assumed care from Dr Roxanne Mins. 52 yo M with pSVT and ESRD on iHD who presents with palpitations and found to be in SVT. Labs delayed 2/2 difficulty with IV access. [RP]    Clinical Course User Index [RP] Fransico Meadow, MD                             Medical Decision Making  Paroxysmal supraventricular tachycardia.  Old records reviewed, and he has several ED visits for the same condition, most recently on 12/28/2021.  I have ordered screening labs of CBC, basic metabolic panel.  I have ordered a dose of adenosine.  I have reviewed and interpreted his electrocardiogram and my interpretation is paroxysmal supraventricular tachycardia with ST changes which are likely rate related, borderline prolonged QT interval.  Case is signed out to Dr. Philip Aspen.  Final Clinical Impression(s) / ED Diagnoses Final diagnoses:  SVT (supraventricular tachycardia)  End-stage renal disease on hemodialysis [facility])    Rx / DC Orders ED Discharge Orders     None         Delora Fuel, MD [id number]

## 2022-07-05 NOTE — ED Notes (Signed)
Pt placed on zoll at this time.

## 2022-07-05 NOTE — ED Triage Notes (Signed)
Pt presents from dialysis with HR in 160's. No complaints other than feelings of rapid heart rate. Has had a history of this before and chemically had to bring HR down.

## 2022-07-06 DIAGNOSIS — M25512 Pain in left shoulder: Secondary | ICD-10-CM | POA: Diagnosis not present

## 2022-07-06 DIAGNOSIS — M25511 Pain in right shoulder: Secondary | ICD-10-CM | POA: Diagnosis not present

## 2022-07-06 DIAGNOSIS — M549 Dorsalgia, unspecified: Secondary | ICD-10-CM | POA: Diagnosis not present

## 2022-07-06 DIAGNOSIS — M25569 Pain in unspecified knee: Secondary | ICD-10-CM | POA: Diagnosis not present

## 2022-07-06 DIAGNOSIS — Z79891 Long term (current) use of opiate analgesic: Secondary | ICD-10-CM | POA: Diagnosis not present

## 2022-07-06 DIAGNOSIS — M542 Cervicalgia: Secondary | ICD-10-CM | POA: Diagnosis not present

## 2022-07-06 DIAGNOSIS — G894 Chronic pain syndrome: Secondary | ICD-10-CM | POA: Diagnosis not present

## 2022-07-07 DIAGNOSIS — Z992 Dependence on renal dialysis: Secondary | ICD-10-CM | POA: Diagnosis not present

## 2022-07-07 DIAGNOSIS — N186 End stage renal disease: Secondary | ICD-10-CM | POA: Diagnosis not present

## 2022-07-08 ENCOUNTER — Encounter: Payer: Self-pay | Admitting: Cardiology

## 2022-07-08 ENCOUNTER — Ambulatory Visit: Payer: 59 | Attending: Cardiology | Admitting: Cardiology

## 2022-07-08 VITALS — BP 122/80 | HR 75 | Ht 72.0 in | Wt 268.4 lb

## 2022-07-08 DIAGNOSIS — I10 Essential (primary) hypertension: Secondary | ICD-10-CM

## 2022-07-08 DIAGNOSIS — E782 Mixed hyperlipidemia: Secondary | ICD-10-CM

## 2022-07-08 DIAGNOSIS — I471 Supraventricular tachycardia, unspecified: Secondary | ICD-10-CM | POA: Diagnosis not present

## 2022-07-08 MED ORDER — METOPROLOL TARTRATE 25 MG PO TABS: 37.5000 mg | ORAL_TABLET | Freq: Two times a day (BID) | ORAL | 3 refills | Status: DC

## 2022-07-08 NOTE — Patient Instructions (Signed)
Medication Instructions:  INCREASE Lopresor to 37.5 mg twice a day   Labwork: None today  Testing/Procedures: None today  Follow-Up: 6 months  Any Other Special Instructions Will Be Listed Below (If Applicable).  If you need a refill on your cardiac medications before your next appointment, please call your pharmacy.

## 2022-07-08 NOTE — Progress Notes (Signed)
Clinical Summary Mr. Rehl is a 52 y.o.male seen today for follow up of the following medical problems  1.SVT - followed by EP -has been on metoprolol, consideration for ablation if symptoms were to worsen per EP note - ER visit 07/05/22 with recurrent SVT, converted with adenosine.   - has been compliant with meds.   2.ESRD  3. HTN - compliant with meds  4. HFimpEF - EF 35% in 2011, normalized to 60-65% by imaging in 12/2013  - no SOB/DOE  5. Hyperlipidemia - labs followed by pcp   Past Medical History:  Diagnosis Date   Anemia    Attributed to chronic disease/chronic kidney disease   Chronic diastolic heart failure ([place]) 2011   EF 35% by echo in 2011; echo in 08/2011-normal EF, moderate to severe LVH; BNP level of [phone] in 2011-12   Chronic obstructive pulmonary disease ([place])    with asthmatic component   ESRD (end stage renal disease) on dialysis ([place])    secondary hyperparathyroidism   Hypertension    h/o hypertensive crisis with encephalopathy   Obesity    Tobacco abuse    Approximate consumption of 25 pack years; continuing at 0.5 pack per day     No Known Allergies   Current Outpatient Medications  Medication Sig Dispense Refill   albuterol (ACCUNEB) 1.25 MG/3ML nebulizer solution Take 1 ampule by nebulization as needed for wheezing or shortness of breath.     albuterol (PROAIR HFA) 108 (90 BASE) MCG/ACT inhaler Inhale 2 puffs into the lungs every 4 (four) hours as needed for wheezing. 3 Inhaler 0   aspirin EC 81 MG tablet Take 81 mg by mouth daily.     atorvastatin (LIPITOR) 10 MG tablet Take 10 mg by mouth daily.     AURYXIA 1 GM 210 MG(Fe) tablet Take 420 mg by mouth 3 (three) times daily with meals.      b complex-vitamin c-folic acid (NEPHRO-VITE) 0.8 MG TABS Take 0.8 mg by mouth daily.      budesonide-formoterol (SYMBICORT) 80-4.5 MCG/ACT inhaler Inhale 2 puffs into the lungs 2 (two) times daily. 1 Inhaler 12   cinacalcet (SENSIPAR) 60 MG  tablet Take 60 mg by mouth daily.     diphenhydramine-acetaminophen (TYLENOL PM) 25-500 MG TABS tablet Take 1 tablet by mouth at bedtime as needed (sleep).     DULoxetine (CYMBALTA) 30 MG capsule Take 30 mg by mouth daily.     gabapentin (NEURONTIN) 300 MG capsule Take 300 mg by mouth daily.     metoprolol tartrate (LOPRESSOR) 25 MG tablet Take 1 tablet (25 mg total) by mouth 2 (two) times daily. 180 tablet 3   Omega-3 1000 MG CAPS Take 2,000 mg by mouth daily.     oxyCODONE-acetaminophen (PERCOCET) 7.5-325 MG tablet Take 1 tablet by mouth every 6 (six) hours as needed for moderate pain.     sevelamer carbonate (RENVELA) 800 MG tablet Take 1,600-3,200 mg by mouth See admin instructions. Take 3200 mg with meals and 1600 with snack     No current facility-administered medications for this visit.     Past Surgical History:  Procedure Laterality Date   ARTERIOVENOUS GRAFT PLACEMENT  2011   COLONOSCOPY N/A 02/04/2014   Procedure: COLONOSCOPY;  Surgeon: [name], MD;  [facility];  Service: Endoscopy;  Laterality: N/A;  12:00   COLONOSCOPY N/A 07/03/2019   Procedure: COLONOSCOPY;  Surgeon: [name], MD;  Location: [facility];  Service: Endoscopy;  Laterality: N/A;  12:00-office rescheduled 2/9 @ 11:00am   POLYPECTOMY  07/03/2019   Procedure: POLYPECTOMY;  Surgeon: [name], MD;  Location: [facility];  Service: Endoscopy;;     No Known Allergies    Family History  Problem Relation Age of Onset   Heart disease Father    Diabetes Sister    Hypertension Sister    Diabetes Sister    Hypertension Sister    Kidney disease Mother    Hypertension Mother    Hypertension Sister    Hypertension Brother    Hypertension Brother    Hypertension Brother    Colon cancer Neg Hx      Social History Mr. Sia reports that he has been smoking cigarettes. He started smoking about 36 years ago. He has a 12.50 pack-year smoking history. He has never used smokeless  tobacco. Mr. Polyak reports no history of alcohol use.   Review of Systems CONSTITUTIONAL: No weight loss, fever, chills, weakness or fatigue.  HEENT: Eyes: No visual loss, blurred vision, double vision or yellow sclerae.No hearing loss, sneezing, congestion, runny nose or sore throat.  SKIN: No rash or itching.  CARDIOVASCULAR: per hpi RESPIRATORY: No shortness of breath, cough or sputum.  GASTROINTESTINAL: No anorexia, nausea, vomiting or diarrhea. No abdominal pain or blood.  GENITOURINARY: No burning on urination, no polyuria NEUROLOGICAL: No headache, dizziness, syncope, paralysis, ataxia, numbness or tingling in the extremities. No change in bowel or bladder control.  MUSCULOSKELETAL: No muscle, back pain, joint pain or stiffness.  LYMPHATICS: No enlarged nodes. No history of splenectomy.  PSYCHIATRIC: No history of depression or anxiety.  ENDOCRINOLOGIC: No reports of sweating, cold or heat intolerance. No polyuria or polydipsia.  [name]   Physical Examination Today's Vitals   07/08/22 0809  BP: 122/80  Pulse: 75  SpO2: 97%  Weight: 268 lb 6.4 oz (121.7 kg)  Height: 6' (1.829 m)   Body mass index is 36.4 kg/m.  Gen: resting comfortably, no acute distress HEENT: no scleral icterus, pupils equal round and reactive, no palptable cervical adenopathy,  CV: RRR, no mrg, no jvd Resp: Clear to auscultation bilaterally GI: abdomen is soft, non-tender, non-distended, normal bowel sounds, no hepatosplenomegaly MSK: extremities are warm, no edema.  Skin: warm, no rash Neuro:  no focal deficits Psych: appropriate affect   Diagnostic Studies  NST: 03/2019 No diagnostic ST segment changes to indicate ischemia. Small, mild intensity, inferior defect that is partially reversible at the apex in the setting of increased radiotracer uptake on rest imaging near the inferior wall. Otherwise defect is fixed and suggestive of soft tissue attenuation. Cannot exclude a mild inferior apical  ischemic territory, although with reduced specificity. This is a low risk study. Nuclear stress EF: 53%.   Event Monitor: 07/2020 14 day event monitor Rare supraventricular ectopy. Rare episodes of SVT up to 14 beats. Rare ventricular ectopy. One episode of NSVT lasting 6 beats. Symptoms reported but without sufficent data to correlate with heart rhythm at the time.     Patch Wear Time:  14 days and 0 hours (2022-02-22T18:05:23-0500 to 2022-03-08T18:05:27-0500)   Patient had a min HR of 64 bpm, max HR of 207 bpm, and avg HR of 92 bpm. Predominant underlying rhythm was Sinus Rhythm. 1 run of Ventricular Tachycardia occurred lasting 6 beats with a max rate of 160 bpm (avg 148 bpm). 7 Supraventricular Tachycardia  runs occurred, the run with the fastest interval lasting 14 beats with a max rate of 207 bpm (avg  165 bpm); the run with the fastest interval was also the longest. Isolated SVEs were rare (<1.0%), SVE Couplets were rare (<1.0%), and SVE Triplets were  rare (<1.0%). Isolated VEs were rare (<1.0%), VE Couplets were rare (<1.0%), and no VE Triplets were present. Ventricular Trigeminy was present. Inverted QRS complexes possibly due to inverted placement of device.   Assessment and Plan   1.PSVT - has overall done well on lopressor, recent SVT episode was first in several months - increase lopressor to 37.1m bid, continue to monitor. If progressing recurrences f/u back up with EP to reconsider ablation  2. HTN - at goal, continue current meds  3. Hyperlipidemia - request labs from pcp   F/u 6 months     JArnoldo Lenis M.D.

## 2022-07-09 ENCOUNTER — Encounter: Payer: Self-pay | Admitting: Family Medicine

## 2022-07-09 DIAGNOSIS — Z992 Dependence on renal dialysis: Secondary | ICD-10-CM | POA: Diagnosis not present

## 2022-07-09 DIAGNOSIS — N186 End stage renal disease: Secondary | ICD-10-CM | POA: Diagnosis not present

## 2022-07-13 DIAGNOSIS — Z992 Dependence on renal dialysis: Secondary | ICD-10-CM | POA: Diagnosis not present

## 2022-07-13 DIAGNOSIS — N186 End stage renal disease: Secondary | ICD-10-CM | POA: Diagnosis not present

## 2022-07-14 DIAGNOSIS — N186 End stage renal disease: Secondary | ICD-10-CM | POA: Diagnosis not present

## 2022-07-14 DIAGNOSIS — Z992 Dependence on renal dialysis: Secondary | ICD-10-CM | POA: Diagnosis not present

## 2022-07-16 DIAGNOSIS — N186 End stage renal disease: Secondary | ICD-10-CM | POA: Diagnosis not present

## 2022-07-16 DIAGNOSIS — Z992 Dependence on renal dialysis: Secondary | ICD-10-CM | POA: Diagnosis not present

## 2022-07-19 DIAGNOSIS — Z992 Dependence on renal dialysis: Secondary | ICD-10-CM | POA: Diagnosis not present

## 2022-07-19 DIAGNOSIS — N186 End stage renal disease: Secondary | ICD-10-CM | POA: Diagnosis not present

## 2022-07-21 DIAGNOSIS — N186 End stage renal disease: Secondary | ICD-10-CM | POA: Diagnosis not present

## 2022-07-21 DIAGNOSIS — Z992 Dependence on renal dialysis: Secondary | ICD-10-CM | POA: Diagnosis not present

## 2022-07-22 ENCOUNTER — Encounter: Payer: Self-pay | Admitting: Radiology

## 2022-07-22 DIAGNOSIS — N186 End stage renal disease: Secondary | ICD-10-CM | POA: Diagnosis not present

## 2022-07-22 DIAGNOSIS — Z992 Dependence on renal dialysis: Secondary | ICD-10-CM | POA: Diagnosis not present

## 2022-07-23 DIAGNOSIS — Z992 Dependence on renal dialysis: Secondary | ICD-10-CM | POA: Diagnosis not present

## 2022-07-23 DIAGNOSIS — N186 End stage renal disease: Secondary | ICD-10-CM | POA: Diagnosis not present

## 2022-07-26 DIAGNOSIS — Z992 Dependence on renal dialysis: Secondary | ICD-10-CM | POA: Diagnosis not present

## 2022-07-26 DIAGNOSIS — N186 End stage renal disease: Secondary | ICD-10-CM | POA: Diagnosis not present

## 2022-07-28 DIAGNOSIS — N186 End stage renal disease: Secondary | ICD-10-CM | POA: Diagnosis not present

## 2022-07-28 DIAGNOSIS — Z992 Dependence on renal dialysis: Secondary | ICD-10-CM | POA: Diagnosis not present

## 2022-07-30 DIAGNOSIS — Z992 Dependence on renal dialysis: Secondary | ICD-10-CM | POA: Diagnosis not present

## 2022-07-30 DIAGNOSIS — N186 End stage renal disease: Secondary | ICD-10-CM | POA: Diagnosis not present

## 2022-08-02 DIAGNOSIS — Z992 Dependence on renal dialysis: Secondary | ICD-10-CM | POA: Diagnosis not present

## 2022-08-02 DIAGNOSIS — N186 End stage renal disease: Secondary | ICD-10-CM | POA: Diagnosis not present

## 2022-08-03 DIAGNOSIS — G894 Chronic pain syndrome: Secondary | ICD-10-CM | POA: Diagnosis not present

## 2022-08-03 DIAGNOSIS — M542 Cervicalgia: Secondary | ICD-10-CM | POA: Diagnosis not present

## 2022-08-03 DIAGNOSIS — M25512 Pain in left shoulder: Secondary | ICD-10-CM | POA: Diagnosis not present

## 2022-08-03 DIAGNOSIS — Z79891 Long term (current) use of opiate analgesic: Secondary | ICD-10-CM | POA: Diagnosis not present

## 2022-08-03 DIAGNOSIS — M549 Dorsalgia, unspecified: Secondary | ICD-10-CM | POA: Diagnosis not present

## 2022-08-03 DIAGNOSIS — M25511 Pain in right shoulder: Secondary | ICD-10-CM | POA: Diagnosis not present

## 2022-08-03 DIAGNOSIS — M25569 Pain in unspecified knee: Secondary | ICD-10-CM | POA: Diagnosis not present

## 2022-08-04 DIAGNOSIS — N186 End stage renal disease: Secondary | ICD-10-CM | POA: Diagnosis not present

## 2022-08-04 DIAGNOSIS — Z992 Dependence on renal dialysis: Secondary | ICD-10-CM | POA: Diagnosis not present

## 2022-08-06 DIAGNOSIS — Z992 Dependence on renal dialysis: Secondary | ICD-10-CM | POA: Diagnosis not present

## 2022-08-06 DIAGNOSIS — N186 End stage renal disease: Secondary | ICD-10-CM | POA: Diagnosis not present

## 2022-08-09 DIAGNOSIS — Z992 Dependence on renal dialysis: Secondary | ICD-10-CM | POA: Diagnosis not present

## 2022-08-09 DIAGNOSIS — N186 End stage renal disease: Secondary | ICD-10-CM | POA: Diagnosis not present

## 2022-08-11 DIAGNOSIS — N186 End stage renal disease: Secondary | ICD-10-CM | POA: Diagnosis not present

## 2022-08-11 DIAGNOSIS — Z992 Dependence on renal dialysis: Secondary | ICD-10-CM | POA: Diagnosis not present

## 2022-08-13 DIAGNOSIS — Z992 Dependence on renal dialysis: Secondary | ICD-10-CM | POA: Diagnosis not present

## 2022-08-13 DIAGNOSIS — N186 End stage renal disease: Secondary | ICD-10-CM | POA: Diagnosis not present

## 2022-08-16 DIAGNOSIS — Z992 Dependence on renal dialysis: Secondary | ICD-10-CM | POA: Diagnosis not present

## 2022-08-16 DIAGNOSIS — N186 End stage renal disease: Secondary | ICD-10-CM | POA: Diagnosis not present

## 2022-08-18 DIAGNOSIS — N186 End stage renal disease: Secondary | ICD-10-CM | POA: Diagnosis not present

## 2022-08-18 DIAGNOSIS — Z992 Dependence on renal dialysis: Secondary | ICD-10-CM | POA: Diagnosis not present

## 2022-08-20 DIAGNOSIS — N186 End stage renal disease: Secondary | ICD-10-CM | POA: Diagnosis not present

## 2022-08-20 DIAGNOSIS — Z992 Dependence on renal dialysis: Secondary | ICD-10-CM | POA: Diagnosis not present

## 2022-08-22 DIAGNOSIS — Z992 Dependence on renal dialysis: Secondary | ICD-10-CM | POA: Diagnosis not present

## 2022-08-22 DIAGNOSIS — N186 End stage renal disease: Secondary | ICD-10-CM | POA: Diagnosis not present

## 2022-08-23 DIAGNOSIS — Z23 Encounter for immunization: Secondary | ICD-10-CM | POA: Diagnosis not present

## 2022-08-23 DIAGNOSIS — N186 End stage renal disease: Secondary | ICD-10-CM | POA: Diagnosis not present

## 2022-08-23 DIAGNOSIS — Z992 Dependence on renal dialysis: Secondary | ICD-10-CM | POA: Diagnosis not present

## 2022-08-25 DIAGNOSIS — N186 End stage renal disease: Secondary | ICD-10-CM | POA: Diagnosis not present

## 2022-08-25 DIAGNOSIS — Z23 Encounter for immunization: Secondary | ICD-10-CM | POA: Diagnosis not present

## 2022-08-25 DIAGNOSIS — Z992 Dependence on renal dialysis: Secondary | ICD-10-CM | POA: Diagnosis not present

## 2022-08-27 DIAGNOSIS — Z23 Encounter for immunization: Secondary | ICD-10-CM | POA: Diagnosis not present

## 2022-08-27 DIAGNOSIS — Z992 Dependence on renal dialysis: Secondary | ICD-10-CM | POA: Diagnosis not present

## 2022-08-27 DIAGNOSIS — N186 End stage renal disease: Secondary | ICD-10-CM | POA: Diagnosis not present

## 2022-08-30 DIAGNOSIS — Z992 Dependence on renal dialysis: Secondary | ICD-10-CM | POA: Diagnosis not present

## 2022-08-30 DIAGNOSIS — Z23 Encounter for immunization: Secondary | ICD-10-CM | POA: Diagnosis not present

## 2022-08-30 DIAGNOSIS — N186 End stage renal disease: Secondary | ICD-10-CM | POA: Diagnosis not present

## 2022-08-31 DIAGNOSIS — G894 Chronic pain syndrome: Secondary | ICD-10-CM | POA: Diagnosis not present

## 2022-08-31 DIAGNOSIS — M25511 Pain in right shoulder: Secondary | ICD-10-CM | POA: Diagnosis not present

## 2022-08-31 DIAGNOSIS — M542 Cervicalgia: Secondary | ICD-10-CM | POA: Diagnosis not present

## 2022-08-31 DIAGNOSIS — Z79899 Other long term (current) drug therapy: Secondary | ICD-10-CM | POA: Diagnosis not present

## 2022-08-31 DIAGNOSIS — M25512 Pain in left shoulder: Secondary | ICD-10-CM | POA: Diagnosis not present

## 2022-08-31 DIAGNOSIS — Z79891 Long term (current) use of opiate analgesic: Secondary | ICD-10-CM | POA: Diagnosis not present

## 2022-08-31 DIAGNOSIS — M549 Dorsalgia, unspecified: Secondary | ICD-10-CM | POA: Diagnosis not present

## 2022-08-31 DIAGNOSIS — M25569 Pain in unspecified knee: Secondary | ICD-10-CM | POA: Diagnosis not present

## 2022-09-01 DIAGNOSIS — Z992 Dependence on renal dialysis: Secondary | ICD-10-CM | POA: Diagnosis not present

## 2022-09-01 DIAGNOSIS — N186 End stage renal disease: Secondary | ICD-10-CM | POA: Diagnosis not present

## 2022-09-01 DIAGNOSIS — Z23 Encounter for immunization: Secondary | ICD-10-CM | POA: Diagnosis not present

## 2022-09-03 DIAGNOSIS — Z23 Encounter for immunization: Secondary | ICD-10-CM | POA: Diagnosis not present

## 2022-09-03 DIAGNOSIS — Z992 Dependence on renal dialysis: Secondary | ICD-10-CM | POA: Diagnosis not present

## 2022-09-03 DIAGNOSIS — N186 End stage renal disease: Secondary | ICD-10-CM | POA: Diagnosis not present

## 2022-09-06 DIAGNOSIS — Z992 Dependence on renal dialysis: Secondary | ICD-10-CM | POA: Diagnosis not present

## 2022-09-06 DIAGNOSIS — Z23 Encounter for immunization: Secondary | ICD-10-CM | POA: Diagnosis not present

## 2022-09-06 DIAGNOSIS — N186 End stage renal disease: Secondary | ICD-10-CM | POA: Diagnosis not present

## 2022-09-08 DIAGNOSIS — Z23 Encounter for immunization: Secondary | ICD-10-CM | POA: Diagnosis not present

## 2022-09-08 DIAGNOSIS — Z992 Dependence on renal dialysis: Secondary | ICD-10-CM | POA: Diagnosis not present

## 2022-09-08 DIAGNOSIS — N186 End stage renal disease: Secondary | ICD-10-CM | POA: Diagnosis not present

## 2022-09-10 DIAGNOSIS — Z992 Dependence on renal dialysis: Secondary | ICD-10-CM | POA: Diagnosis not present

## 2022-09-10 DIAGNOSIS — Z23 Encounter for immunization: Secondary | ICD-10-CM | POA: Diagnosis not present

## 2022-09-10 DIAGNOSIS — N186 End stage renal disease: Secondary | ICD-10-CM | POA: Diagnosis not present

## 2022-09-13 DIAGNOSIS — N186 End stage renal disease: Secondary | ICD-10-CM | POA: Diagnosis not present

## 2022-09-13 DIAGNOSIS — Z23 Encounter for immunization: Secondary | ICD-10-CM | POA: Diagnosis not present

## 2022-09-13 DIAGNOSIS — Z992 Dependence on renal dialysis: Secondary | ICD-10-CM | POA: Diagnosis not present

## 2022-09-15 DIAGNOSIS — Z23 Encounter for immunization: Secondary | ICD-10-CM | POA: Diagnosis not present

## 2022-09-15 DIAGNOSIS — N186 End stage renal disease: Secondary | ICD-10-CM | POA: Diagnosis not present

## 2022-09-15 DIAGNOSIS — Z992 Dependence on renal dialysis: Secondary | ICD-10-CM | POA: Diagnosis not present

## 2022-09-17 DIAGNOSIS — N186 End stage renal disease: Secondary | ICD-10-CM | POA: Diagnosis not present

## 2022-09-17 DIAGNOSIS — Z992 Dependence on renal dialysis: Secondary | ICD-10-CM | POA: Diagnosis not present

## 2022-09-17 DIAGNOSIS — Z23 Encounter for immunization: Secondary | ICD-10-CM | POA: Diagnosis not present

## 2022-09-20 DIAGNOSIS — N186 End stage renal disease: Secondary | ICD-10-CM | POA: Diagnosis not present

## 2022-09-20 DIAGNOSIS — Z23 Encounter for immunization: Secondary | ICD-10-CM | POA: Diagnosis not present

## 2022-09-20 DIAGNOSIS — Z992 Dependence on renal dialysis: Secondary | ICD-10-CM | POA: Diagnosis not present

## 2022-09-21 DIAGNOSIS — Z992 Dependence on renal dialysis: Secondary | ICD-10-CM | POA: Diagnosis not present

## 2022-09-21 DIAGNOSIS — N186 End stage renal disease: Secondary | ICD-10-CM | POA: Diagnosis not present

## 2022-09-22 DIAGNOSIS — N186 End stage renal disease: Secondary | ICD-10-CM | POA: Diagnosis not present

## 2022-09-22 DIAGNOSIS — Z992 Dependence on renal dialysis: Secondary | ICD-10-CM | POA: Diagnosis not present

## 2022-09-24 DIAGNOSIS — N186 End stage renal disease: Secondary | ICD-10-CM | POA: Diagnosis not present

## 2022-09-24 DIAGNOSIS — Z992 Dependence on renal dialysis: Secondary | ICD-10-CM | POA: Diagnosis not present

## 2022-09-27 DIAGNOSIS — Z992 Dependence on renal dialysis: Secondary | ICD-10-CM | POA: Diagnosis not present

## 2022-09-27 DIAGNOSIS — N186 End stage renal disease: Secondary | ICD-10-CM | POA: Diagnosis not present

## 2022-09-28 DIAGNOSIS — M549 Dorsalgia, unspecified: Secondary | ICD-10-CM | POA: Diagnosis not present

## 2022-09-28 DIAGNOSIS — G894 Chronic pain syndrome: Secondary | ICD-10-CM | POA: Diagnosis not present

## 2022-09-28 DIAGNOSIS — M25569 Pain in unspecified knee: Secondary | ICD-10-CM | POA: Diagnosis not present

## 2022-09-28 DIAGNOSIS — M25512 Pain in left shoulder: Secondary | ICD-10-CM | POA: Diagnosis not present

## 2022-09-28 DIAGNOSIS — Z79891 Long term (current) use of opiate analgesic: Secondary | ICD-10-CM | POA: Diagnosis not present

## 2022-09-28 DIAGNOSIS — M542 Cervicalgia: Secondary | ICD-10-CM | POA: Diagnosis not present

## 2022-09-28 DIAGNOSIS — M25511 Pain in right shoulder: Secondary | ICD-10-CM | POA: Diagnosis not present

## 2022-09-29 DIAGNOSIS — N186 End stage renal disease: Secondary | ICD-10-CM | POA: Diagnosis not present

## 2022-09-29 DIAGNOSIS — Z992 Dependence on renal dialysis: Secondary | ICD-10-CM | POA: Diagnosis not present

## 2022-10-01 DIAGNOSIS — Z992 Dependence on renal dialysis: Secondary | ICD-10-CM | POA: Diagnosis not present

## 2022-10-01 DIAGNOSIS — N186 End stage renal disease: Secondary | ICD-10-CM | POA: Diagnosis not present

## 2022-10-04 DIAGNOSIS — N186 End stage renal disease: Secondary | ICD-10-CM | POA: Diagnosis not present

## 2022-10-04 DIAGNOSIS — Z992 Dependence on renal dialysis: Secondary | ICD-10-CM | POA: Diagnosis not present

## 2022-10-06 DIAGNOSIS — N186 End stage renal disease: Secondary | ICD-10-CM | POA: Diagnosis not present

## 2022-10-06 DIAGNOSIS — Z992 Dependence on renal dialysis: Secondary | ICD-10-CM | POA: Diagnosis not present

## 2022-10-08 DIAGNOSIS — Z992 Dependence on renal dialysis: Secondary | ICD-10-CM | POA: Diagnosis not present

## 2022-10-08 DIAGNOSIS — N186 End stage renal disease: Secondary | ICD-10-CM | POA: Diagnosis not present

## 2022-10-11 DIAGNOSIS — N186 End stage renal disease: Secondary | ICD-10-CM | POA: Diagnosis not present

## 2022-10-11 DIAGNOSIS — Z992 Dependence on renal dialysis: Secondary | ICD-10-CM | POA: Diagnosis not present

## 2022-10-13 DIAGNOSIS — N186 End stage renal disease: Secondary | ICD-10-CM | POA: Diagnosis not present

## 2022-10-13 DIAGNOSIS — Z992 Dependence on renal dialysis: Secondary | ICD-10-CM | POA: Diagnosis not present

## 2022-10-15 DIAGNOSIS — N186 End stage renal disease: Secondary | ICD-10-CM | POA: Diagnosis not present

## 2022-10-15 DIAGNOSIS — Z992 Dependence on renal dialysis: Secondary | ICD-10-CM | POA: Diagnosis not present

## 2022-10-18 DIAGNOSIS — Z992 Dependence on renal dialysis: Secondary | ICD-10-CM | POA: Diagnosis not present

## 2022-10-18 DIAGNOSIS — N186 End stage renal disease: Secondary | ICD-10-CM | POA: Diagnosis not present

## 2022-10-20 DIAGNOSIS — Z992 Dependence on renal dialysis: Secondary | ICD-10-CM | POA: Diagnosis not present

## 2022-10-20 DIAGNOSIS — N186 End stage renal disease: Secondary | ICD-10-CM | POA: Diagnosis not present

## 2022-10-22 DIAGNOSIS — Z992 Dependence on renal dialysis: Secondary | ICD-10-CM | POA: Diagnosis not present

## 2022-10-22 DIAGNOSIS — N186 End stage renal disease: Secondary | ICD-10-CM | POA: Diagnosis not present

## 2022-10-23 DIAGNOSIS — N186 End stage renal disease: Secondary | ICD-10-CM | POA: Diagnosis not present

## 2022-10-23 DIAGNOSIS — Z992 Dependence on renal dialysis: Secondary | ICD-10-CM | POA: Diagnosis not present

## 2022-10-25 DIAGNOSIS — N186 End stage renal disease: Secondary | ICD-10-CM | POA: Diagnosis not present

## 2022-10-25 DIAGNOSIS — Z992 Dependence on renal dialysis: Secondary | ICD-10-CM | POA: Diagnosis not present

## 2022-10-26 DIAGNOSIS — M25512 Pain in left shoulder: Secondary | ICD-10-CM | POA: Diagnosis not present

## 2022-10-26 DIAGNOSIS — M25511 Pain in right shoulder: Secondary | ICD-10-CM | POA: Diagnosis not present

## 2022-10-26 DIAGNOSIS — Z79891 Long term (current) use of opiate analgesic: Secondary | ICD-10-CM | POA: Diagnosis not present

## 2022-10-26 DIAGNOSIS — M549 Dorsalgia, unspecified: Secondary | ICD-10-CM | POA: Diagnosis not present

## 2022-10-26 DIAGNOSIS — M25569 Pain in unspecified knee: Secondary | ICD-10-CM | POA: Diagnosis not present

## 2022-10-26 DIAGNOSIS — Z79899 Other long term (current) drug therapy: Secondary | ICD-10-CM | POA: Diagnosis not present

## 2022-10-26 DIAGNOSIS — M542 Cervicalgia: Secondary | ICD-10-CM | POA: Diagnosis not present

## 2022-10-26 DIAGNOSIS — G894 Chronic pain syndrome: Secondary | ICD-10-CM | POA: Diagnosis not present

## 2022-10-27 DIAGNOSIS — Z992 Dependence on renal dialysis: Secondary | ICD-10-CM | POA: Diagnosis not present

## 2022-10-27 DIAGNOSIS — N186 End stage renal disease: Secondary | ICD-10-CM | POA: Diagnosis not present

## 2022-10-29 DIAGNOSIS — Z992 Dependence on renal dialysis: Secondary | ICD-10-CM | POA: Diagnosis not present

## 2022-10-29 DIAGNOSIS — N186 End stage renal disease: Secondary | ICD-10-CM | POA: Diagnosis not present

## 2022-10-31 DIAGNOSIS — N186 End stage renal disease: Secondary | ICD-10-CM | POA: Diagnosis not present

## 2022-10-31 DIAGNOSIS — Z992 Dependence on renal dialysis: Secondary | ICD-10-CM | POA: Diagnosis not present

## 2022-11-01 DIAGNOSIS — N186 End stage renal disease: Secondary | ICD-10-CM | POA: Diagnosis not present

## 2022-11-01 DIAGNOSIS — Z992 Dependence on renal dialysis: Secondary | ICD-10-CM | POA: Diagnosis not present

## 2022-11-03 DIAGNOSIS — N186 End stage renal disease: Secondary | ICD-10-CM | POA: Diagnosis not present

## 2022-11-03 DIAGNOSIS — Z992 Dependence on renal dialysis: Secondary | ICD-10-CM | POA: Diagnosis not present

## 2022-11-05 DIAGNOSIS — N186 End stage renal disease: Secondary | ICD-10-CM | POA: Diagnosis not present

## 2022-11-05 DIAGNOSIS — Z992 Dependence on renal dialysis: Secondary | ICD-10-CM | POA: Diagnosis not present

## 2022-11-08 DIAGNOSIS — N186 End stage renal disease: Secondary | ICD-10-CM | POA: Diagnosis not present

## 2022-11-08 DIAGNOSIS — Z992 Dependence on renal dialysis: Secondary | ICD-10-CM | POA: Diagnosis not present

## 2022-11-09 DIAGNOSIS — R222 Localized swelling, mass and lump, trunk: Secondary | ICD-10-CM | POA: Diagnosis not present

## 2022-11-10 DIAGNOSIS — N186 End stage renal disease: Secondary | ICD-10-CM | POA: Diagnosis not present

## 2022-11-10 DIAGNOSIS — Z992 Dependence on renal dialysis: Secondary | ICD-10-CM | POA: Diagnosis not present

## 2022-11-12 DIAGNOSIS — N186 End stage renal disease: Secondary | ICD-10-CM | POA: Diagnosis not present

## 2022-11-12 DIAGNOSIS — Z992 Dependence on renal dialysis: Secondary | ICD-10-CM | POA: Diagnosis not present

## 2022-11-15 DIAGNOSIS — Z992 Dependence on renal dialysis: Secondary | ICD-10-CM | POA: Diagnosis not present

## 2022-11-15 DIAGNOSIS — N186 End stage renal disease: Secondary | ICD-10-CM | POA: Diagnosis not present

## 2022-11-17 DIAGNOSIS — N186 End stage renal disease: Secondary | ICD-10-CM | POA: Diagnosis not present

## 2022-11-17 DIAGNOSIS — Z992 Dependence on renal dialysis: Secondary | ICD-10-CM | POA: Diagnosis not present

## 2022-11-19 DIAGNOSIS — N186 End stage renal disease: Secondary | ICD-10-CM | POA: Diagnosis not present

## 2022-11-19 DIAGNOSIS — Z992 Dependence on renal dialysis: Secondary | ICD-10-CM | POA: Diagnosis not present

## 2022-11-21 DIAGNOSIS — Z992 Dependence on renal dialysis: Secondary | ICD-10-CM | POA: Diagnosis not present

## 2022-11-21 DIAGNOSIS — N186 End stage renal disease: Secondary | ICD-10-CM | POA: Diagnosis not present

## 2022-11-22 DIAGNOSIS — Z992 Dependence on renal dialysis: Secondary | ICD-10-CM | POA: Diagnosis not present

## 2022-11-22 DIAGNOSIS — N186 End stage renal disease: Secondary | ICD-10-CM | POA: Diagnosis not present

## 2022-11-24 DIAGNOSIS — R92323 Mammographic fibroglandular density, bilateral breasts: Secondary | ICD-10-CM | POA: Diagnosis not present

## 2022-11-24 DIAGNOSIS — N62 Hypertrophy of breast: Secondary | ICD-10-CM | POA: Diagnosis not present

## 2022-11-24 DIAGNOSIS — N186 End stage renal disease: Secondary | ICD-10-CM | POA: Diagnosis not present

## 2022-11-24 DIAGNOSIS — Z992 Dependence on renal dialysis: Secondary | ICD-10-CM | POA: Diagnosis not present

## 2022-11-26 DIAGNOSIS — N186 End stage renal disease: Secondary | ICD-10-CM | POA: Diagnosis not present

## 2022-11-26 DIAGNOSIS — Z992 Dependence on renal dialysis: Secondary | ICD-10-CM | POA: Diagnosis not present

## 2022-11-29 DIAGNOSIS — N186 End stage renal disease: Secondary | ICD-10-CM | POA: Diagnosis not present

## 2022-11-29 DIAGNOSIS — Z992 Dependence on renal dialysis: Secondary | ICD-10-CM | POA: Diagnosis not present

## 2022-12-01 DIAGNOSIS — Z992 Dependence on renal dialysis: Secondary | ICD-10-CM | POA: Diagnosis not present

## 2022-12-01 DIAGNOSIS — N186 End stage renal disease: Secondary | ICD-10-CM | POA: Diagnosis not present

## 2022-12-03 DIAGNOSIS — N186 End stage renal disease: Secondary | ICD-10-CM | POA: Diagnosis not present

## 2022-12-03 DIAGNOSIS — Z992 Dependence on renal dialysis: Secondary | ICD-10-CM | POA: Diagnosis not present

## 2022-12-06 DIAGNOSIS — N186 End stage renal disease: Secondary | ICD-10-CM | POA: Diagnosis not present

## 2022-12-06 DIAGNOSIS — Z992 Dependence on renal dialysis: Secondary | ICD-10-CM | POA: Diagnosis not present

## 2022-12-08 DIAGNOSIS — Z992 Dependence on renal dialysis: Secondary | ICD-10-CM | POA: Diagnosis not present

## 2022-12-08 DIAGNOSIS — N186 End stage renal disease: Secondary | ICD-10-CM | POA: Diagnosis not present

## 2022-12-10 DIAGNOSIS — N186 End stage renal disease: Secondary | ICD-10-CM | POA: Diagnosis not present

## 2022-12-10 DIAGNOSIS — Z992 Dependence on renal dialysis: Secondary | ICD-10-CM | POA: Diagnosis not present

## 2022-12-13 DIAGNOSIS — N186 End stage renal disease: Secondary | ICD-10-CM | POA: Diagnosis not present

## 2022-12-13 DIAGNOSIS — Z992 Dependence on renal dialysis: Secondary | ICD-10-CM | POA: Diagnosis not present

## 2022-12-15 ENCOUNTER — Telehealth: Payer: Self-pay | Admitting: Cardiology

## 2022-12-15 NOTE — Telephone Encounter (Signed)
[place] patient.   

## 2022-12-15 NOTE — Telephone Encounter (Signed)
Pt notified to monitor BP and Hr and update [place] on Friday.

## 2022-12-15 NOTE — Telephone Encounter (Signed)
Returned call to pt  who states that before starting dialysis today that his heartrate with between 130-150. Pt states that he went home and took another Metoprolol and now he feels fine. Pt was unable to check BP and Hr while on the phone d/t not being at home. Please advise.

## 2022-12-15 NOTE — Telephone Encounter (Signed)
If able to check heart rates at home monitor and update [place] Friday. If does not able to check at home have the dialysis nurses update him at his next session and then update [place]  Dominga Ferry MD

## 2022-12-15 NOTE — Telephone Encounter (Signed)
Patient is calling because he noticed that his BP is high because he goes in to dialysis. Wanted to know what to do

## 2022-12-16 DIAGNOSIS — Z992 Dependence on renal dialysis: Secondary | ICD-10-CM | POA: Diagnosis not present

## 2022-12-16 DIAGNOSIS — N186 End stage renal disease: Secondary | ICD-10-CM | POA: Diagnosis not present

## 2022-12-17 DIAGNOSIS — Z992 Dependence on renal dialysis: Secondary | ICD-10-CM | POA: Diagnosis not present

## 2022-12-17 DIAGNOSIS — N186 End stage renal disease: Secondary | ICD-10-CM | POA: Diagnosis not present

## 2022-12-20 DIAGNOSIS — Z992 Dependence on renal dialysis: Secondary | ICD-10-CM | POA: Diagnosis not present

## 2022-12-20 DIAGNOSIS — N186 End stage renal disease: Secondary | ICD-10-CM | POA: Diagnosis not present

## 2022-12-21 DIAGNOSIS — M25569 Pain in unspecified knee: Secondary | ICD-10-CM | POA: Diagnosis not present

## 2022-12-21 DIAGNOSIS — M542 Cervicalgia: Secondary | ICD-10-CM | POA: Diagnosis not present

## 2022-12-21 DIAGNOSIS — Z79891 Long term (current) use of opiate analgesic: Secondary | ICD-10-CM | POA: Diagnosis not present

## 2022-12-21 DIAGNOSIS — M25512 Pain in left shoulder: Secondary | ICD-10-CM | POA: Diagnosis not present

## 2022-12-21 DIAGNOSIS — G894 Chronic pain syndrome: Secondary | ICD-10-CM | POA: Diagnosis not present

## 2022-12-21 DIAGNOSIS — Z79899 Other long term (current) drug therapy: Secondary | ICD-10-CM | POA: Diagnosis not present

## 2022-12-21 DIAGNOSIS — M549 Dorsalgia, unspecified: Secondary | ICD-10-CM | POA: Diagnosis not present

## 2022-12-21 DIAGNOSIS — M25511 Pain in right shoulder: Secondary | ICD-10-CM | POA: Diagnosis not present

## 2022-12-22 DIAGNOSIS — N186 End stage renal disease: Secondary | ICD-10-CM | POA: Diagnosis not present

## 2022-12-22 DIAGNOSIS — Z992 Dependence on renal dialysis: Secondary | ICD-10-CM | POA: Diagnosis not present

## 2022-12-23 DIAGNOSIS — E782 Mixed hyperlipidemia: Secondary | ICD-10-CM | POA: Diagnosis not present

## 2022-12-23 DIAGNOSIS — E7849 Other hyperlipidemia: Secondary | ICD-10-CM | POA: Diagnosis not present

## 2022-12-23 DIAGNOSIS — N186 End stage renal disease: Secondary | ICD-10-CM | POA: Diagnosis not present

## 2022-12-23 DIAGNOSIS — F17210 Nicotine dependence, cigarettes, uncomplicated: Secondary | ICD-10-CM | POA: Diagnosis not present

## 2022-12-24 DIAGNOSIS — Z992 Dependence on renal dialysis: Secondary | ICD-10-CM | POA: Diagnosis not present

## 2022-12-24 DIAGNOSIS — N186 End stage renal disease: Secondary | ICD-10-CM | POA: Diagnosis not present

## 2022-12-27 DIAGNOSIS — N186 End stage renal disease: Secondary | ICD-10-CM | POA: Diagnosis not present

## 2022-12-27 DIAGNOSIS — I871 Compression of vein: Secondary | ICD-10-CM | POA: Diagnosis not present

## 2022-12-27 DIAGNOSIS — T82858A Stenosis of vascular prosthetic devices, implants and grafts, initial encounter: Secondary | ICD-10-CM | POA: Diagnosis not present

## 2022-12-27 DIAGNOSIS — Z992 Dependence on renal dialysis: Secondary | ICD-10-CM | POA: Diagnosis not present

## 2022-12-28 DIAGNOSIS — E7849 Other hyperlipidemia: Secondary | ICD-10-CM | POA: Diagnosis not present

## 2022-12-28 DIAGNOSIS — F17210 Nicotine dependence, cigarettes, uncomplicated: Secondary | ICD-10-CM | POA: Diagnosis not present

## 2022-12-28 DIAGNOSIS — I471 Supraventricular tachycardia, unspecified: Secondary | ICD-10-CM | POA: Diagnosis not present

## 2022-12-28 DIAGNOSIS — I120 Hypertensive chronic kidney disease with stage 5 chronic kidney disease or end stage renal disease: Secondary | ICD-10-CM | POA: Diagnosis not present

## 2022-12-28 DIAGNOSIS — R030 Elevated blood-pressure reading, without diagnosis of hypertension: Secondary | ICD-10-CM | POA: Diagnosis not present

## 2022-12-28 DIAGNOSIS — M25812 Other specified joint disorders, left shoulder: Secondary | ICD-10-CM | POA: Diagnosis not present

## 2022-12-28 DIAGNOSIS — M19012 Primary osteoarthritis, left shoulder: Secondary | ICD-10-CM | POA: Diagnosis not present

## 2022-12-28 DIAGNOSIS — Z992 Dependence on renal dialysis: Secondary | ICD-10-CM | POA: Diagnosis not present

## 2022-12-28 DIAGNOSIS — I2510 Atherosclerotic heart disease of native coronary artery without angina pectoris: Secondary | ICD-10-CM | POA: Diagnosis not present

## 2022-12-28 DIAGNOSIS — J449 Chronic obstructive pulmonary disease, unspecified: Secondary | ICD-10-CM | POA: Diagnosis not present

## 2022-12-28 DIAGNOSIS — I700 Atherosclerosis of aorta: Secondary | ICD-10-CM | POA: Diagnosis not present

## 2022-12-28 DIAGNOSIS — M1712 Unilateral primary osteoarthritis, left knee: Secondary | ICD-10-CM | POA: Diagnosis not present

## 2022-12-29 DIAGNOSIS — N186 End stage renal disease: Secondary | ICD-10-CM | POA: Diagnosis not present

## 2022-12-29 DIAGNOSIS — Z992 Dependence on renal dialysis: Secondary | ICD-10-CM | POA: Diagnosis not present

## 2022-12-31 DIAGNOSIS — N186 End stage renal disease: Secondary | ICD-10-CM | POA: Diagnosis not present

## 2022-12-31 DIAGNOSIS — Z992 Dependence on renal dialysis: Secondary | ICD-10-CM | POA: Diagnosis not present

## 2023-01-03 DIAGNOSIS — Z992 Dependence on renal dialysis: Secondary | ICD-10-CM | POA: Diagnosis not present

## 2023-01-03 DIAGNOSIS — N186 End stage renal disease: Secondary | ICD-10-CM | POA: Diagnosis not present

## 2023-01-05 ENCOUNTER — Ambulatory Visit (HOSPITAL_COMMUNITY)
Admission: RE | Admit: 2023-01-05 | Discharge: 2023-01-05 | Disposition: A | Discharge status: Home / Self Care | Payer: 59 | Source: Ambulatory Visit | Attending: Vascular Surgery | Admitting: Vascular Surgery

## 2023-01-05 ENCOUNTER — Encounter: Payer: Self-pay | Admitting: Vascular Surgery

## 2023-01-05 ENCOUNTER — Other Ambulatory Visit: Payer: Self-pay

## 2023-01-05 ENCOUNTER — Ambulatory Visit: Payer: 59 | Admitting: Vascular Surgery

## 2023-01-05 VITALS — BP 125/87 | HR 67 | Temp 97.9°F | Resp 18 | Ht 72.0 in | Wt 266.4 lb

## 2023-01-05 DIAGNOSIS — Z992 Dependence on renal dialysis: Secondary | ICD-10-CM | POA: Diagnosis not present

## 2023-01-05 DIAGNOSIS — N186 End stage renal disease: Secondary | ICD-10-CM

## 2023-01-05 NOTE — Progress Notes (Signed)
Patient ID: Brian Barrera, male   DOB: 1970/10/12, 52 y.o.   MRN: [id number]  Reason for Consult: No chief complaint on file.   Referred by Juliette Alcide, MD  Subjective:     HPI:  Brian Barrera is a 52 y.o. male with end-stage renal disease on dialysis via right Cimino fistula.  This has been present for 11 years.  Patient underwent fistulogram demonstrated thrombus within 2 areas of pseudoaneurysm particularly the pseudoaneurysm on the more proximal arm.  There can ring in the pseudoaneurysm near the wrist and more proximally in the upper arm.  He also has a failed left forearm AV graft many years ago.  He is currently on dialysis Mondays, Wednesdays and Fridays and does not take any blood thinners.  Past Medical History:  Diagnosis Date   Anemia    Attributed to chronic disease/chronic kidney disease   Chronic diastolic heart failure (HCC) 2011   EF 35% by echo in 2011; echo in 08/2011-normal EF, moderate to severe LVH; BNP level of [id number] in 2011-12   Chronic obstructive pulmonary disease (HCC)    with asthmatic component   ESRD (end stage renal disease) on dialysis (HCC)    secondary hyperparathyroidism   Hypertension    h/o hypertensive crisis with encephalopathy   Obesity    Tobacco abuse    Approximate consumption of 25 pack years; continuing at 0.5 pack per day   Family History  Problem Relation Age of Onset   Heart disease Father    Diabetes Sister    Hypertension Sister    Diabetes Sister    Hypertension Sister    Kidney disease Mother    Hypertension Mother    Hypertension Sister    Hypertension Brother    Hypertension Brother    Hypertension Brother    Colon cancer Neg Hx    Past Surgical History:  Procedure Laterality Date   ARTERIOVENOUS GRAFT PLACEMENT  2011   COLONOSCOPY N/A 02/04/2014   Procedure: COLONOSCOPY;  Surgeon: Corbin Ade, MD;  [facility];  Service: Endoscopy;  Laterality: N/A;  12:00   COLONOSCOPY N/A 07/03/2019    Procedure: COLONOSCOPY;  Surgeon: Corbin Ade, MD;  [facility];  Service: Endoscopy;  Laterality: N/A;  12:00-office rescheduled 2/9 @ 11:00am   POLYPECTOMY  07/03/2019   Procedure: POLYPECTOMY;  Surgeon: Corbin Ade, MD;  [facility]: [facility];  Service: Endoscopy;;    Short Social History:  Social History   Tobacco Use   Smoking status: Every Day    Current packs/day: 0.50    Average packs/day: 0.5 packs/day for 37.3 years (18.6 ttl pk-yrs)    Types: Cigarettes    Start date: 09/23/1985   Smokeless tobacco: Never   Tobacco comments:    8-10 ciggs per day   Substance Use Topics   Alcohol use: No    Comment: quit etoh about 2011. used to drink about 8-10 beers a week    No Known Allergies  Current Outpatient Medications  Medication Sig Dispense Refill   albuterol (ACCUNEB) 1.25 MG/3ML nebulizer solution Take 1 ampule by nebulization as needed for wheezing or shortness of breath.     albuterol (PROAIR HFA) 108 (90 BASE) MCG/ACT inhaler Inhale 2 puffs into the lungs every 4 (four) hours as needed for wheezing. 3 Inhaler 0   aspirin EC 81 MG tablet Take 81 mg by mouth daily.     atorvastatin (LIPITOR) 10 MG tablet Take 10 mg  by mouth daily.     AURYXIA 1 GM 210 MG(Fe) tablet Take 420 mg by mouth 3 (three) times daily with meals.      b complex-vitamin c-folic acid (NEPHRO-VITE) 0.8 MG TABS Take 0.8 mg by mouth daily.      budesonide-formoterol (SYMBICORT) 80-4.5 MCG/ACT inhaler Inhale 2 puffs into the lungs 2 (two) times daily. 1 Inhaler 12   cinacalcet (SENSIPAR) 60 MG tablet Take 60 mg by mouth daily.     diphenhydramine-acetaminophen (TYLENOL PM) 25-500 MG TABS tablet Take 1 tablet by mouth at bedtime as needed (sleep).     DULoxetine (CYMBALTA) 30 MG capsule Take 30 mg by mouth daily.     gabapentin (NEURONTIN) 300 MG capsule Take 300 mg by mouth daily.     metoprolol tartrate (LOPRESSOR) 25 MG tablet Take 1.5 tablets (37.5 mg total) by mouth 2 (two) times  daily. 270 tablet 3   Omega-3 1000 MG CAPS Take 2,000 mg by mouth daily.     oxyCODONE-acetaminophen (PERCOCET) 7.5-325 MG tablet Take 1 tablet by mouth every 6 (six) hours as needed for moderate pain.     sevelamer carbonate (RENVELA) 800 MG tablet Take 1,600-3,200 mg by mouth See admin instructions. Take 3200 mg with meals and 1600 with snack     No current facility-administered medications for this visit.    Review of Systems  Constitutional:  Constitutional negative. HENT: HENT negative.  Eyes: Eyes negative.  Respiratory: Respiratory negative.  Cardiovascular: Cardiovascular negative.  GI: Gastrointestinal negative.  Musculoskeletal: Musculoskeletal negative.  Skin: Skin negative.  Neurological: Neurological negative. Hematologic: Hematologic/lymphatic negative.  Psychiatric: Psychiatric negative.        Objective:  Objective   Vitals:   01/05/23 1431  BP: 125/87  Pulse: 67  Resp: 18  Temp: 97.9 F (36.6 C)  SpO2: 96%      Physical Exam Cardiovascular:     Rate and Rhythm: Normal rate.     Pulses:          Radial pulses are 2+ on the right side and 2+ on the left side.  Pulmonary:     Effort: Pulmonary effort is normal.  Abdominal:     General: Abdomen is flat.  Musculoskeletal:     Comments: There are 2 areas of pseudoaneurysm degeneration on the right forearm.  The most proximal arm does not have any palpable flow and towards the wrist there is a thrill.  Neurological:     Mental Status: He is alert.  Psychiatric:        Mood and Affect: Mood normal.        Thought Content: Thought content normal.     Data:  +--------------------+----------+-----------------+--------+  AVF                PSV (cm/s)Flow Vol (mL/min)Comments  +--------------------+----------+-----------------+--------+  Native artery inflow   321                               +--------------------+----------+-----------------+--------+  AVF Anastomosis        228            310                 +--------------------+----------+-----------------+--------+     +------------+----------+-------------+----------+--------+  OUTFLOW VEINPSV (cm/s)Diameter (cm)Depth (cm)Describe  +------------+----------+-------------+----------+--------+  AC Fossa                  0.51  0.79             +------------+----------+-------------+----------+--------+  Prox Forearm    0         0.69        0.91             +------------+----------+-------------+----------+--------+  Mid Forearm     0         2.86        0.28             +------------+----------+-------------+----------+--------+  Dist Forearm    80        2.26        0.30             +------------+----------+-------------+----------+--------+        Summary:  Arteriovenous fistula-Occlusive thrombus noted in the mid and distal  segments of  the forearm with aneurysmal dilatations observed.      Assessment/Plan:    52 year old male with end-stage renal disease on dialysis via right arm AV fistula.  He is hoping to avoid a catheter has what appears to be subtotal occlusive thrombus in one of the 2 pseudoaneurysms in his forearm.  I have offered him grafting around the pseudoaneurysms versus conversion to upper arm access all of these would require temporary catheter which he is hopeful to avoid.  We discussed just revising the one pseudoaneurysm that has large clot burden and this is his preferred method.  They will have to cannulate the fistula at the area of pseudoaneurysm near the wrist and above the other pseudoaneurysm both pre and postoperatively and he demonstrates good understanding of this.  If they are unable to cannulate the fistula towards the antecubitum we will have to place a catheter at the time of procedure he demonstrates good understanding of this.  Will get him scheduled on a nondialysis day in the near future.     Maeola Harman MD Vascular and Vein  [facility]

## 2023-01-05 NOTE — H&P (View-Only) (Signed)
 Patient ID: Brian Barrera, male   DOB: 1970/10/12, 52 y.o.   MRN: [id number]  Reason for Consult: No chief complaint on file.   Referred by Juliette Alcide, MD  Subjective:     HPI:  Brian Barrera is a 52 y.o. male with end-stage renal disease on dialysis via right Cimino fistula.  This has been present for 11 years.  Patient underwent fistulogram demonstrated thrombus within 2 areas of pseudoaneurysm particularly the pseudoaneurysm on the more proximal arm.  There can ring in the pseudoaneurysm near the wrist and more proximally in the upper arm.  He also has a failed left forearm AV graft many years ago.  He is currently on dialysis Mondays, Wednesdays and Fridays and does not take any blood thinners.  Past Medical History:  Diagnosis Date   Anemia    Attributed to chronic disease/chronic kidney disease   Chronic diastolic heart failure (HCC) 2011   EF 35% by echo in 2011; echo in 08/2011-normal EF, moderate to severe LVH; BNP level of [id number] in 2011-12   Chronic obstructive pulmonary disease (HCC)    with asthmatic component   ESRD (end stage renal disease) on dialysis (HCC)    secondary hyperparathyroidism   Hypertension    h/o hypertensive crisis with encephalopathy   Obesity    Tobacco abuse    Approximate consumption of 25 pack years; continuing at 0.5 pack per day   Family History  Problem Relation Age of Onset   Heart disease Father    Diabetes Sister    Hypertension Sister    Diabetes Sister    Hypertension Sister    Kidney disease Mother    Hypertension Mother    Hypertension Sister    Hypertension Brother    Hypertension Brother    Hypertension Brother    Colon cancer Neg Hx    Past Surgical History:  Procedure Laterality Date   ARTERIOVENOUS GRAFT PLACEMENT  2011   COLONOSCOPY N/A 02/04/2014   Procedure: COLONOSCOPY;  Surgeon: Corbin Ade, MD;  [facility];  Service: Endoscopy;  Laterality: N/A;  12:00   COLONOSCOPY N/A 07/03/2019    Procedure: COLONOSCOPY;  Surgeon: Corbin Ade, MD;  [facility];  Service: Endoscopy;  Laterality: N/A;  12:00-office rescheduled 2/9 @ 11:00am   POLYPECTOMY  07/03/2019   Procedure: POLYPECTOMY;  Surgeon: Corbin Ade, MD;  [facility];  Service: Endoscopy;;    Short Social History:  Social History   Tobacco Use   Smoking status: Every Day    Current packs/day: 0.50    Average packs/day: 0.5 packs/day for 37.3 years (18.6 ttl pk-yrs)    Types: Cigarettes    Start date: 09/23/1985   Smokeless tobacco: Never   Tobacco comments:    8-10 ciggs per day   Substance Use Topics   Alcohol use: No    Comment: quit etoh about 2011. used to drink about 8-10 beers a week    No Known Allergies  Current Outpatient Medications  Medication Sig Dispense Refill   albuterol (ACCUNEB) 1.25 MG/3ML nebulizer solution Take 1 ampule by nebulization as needed for wheezing or shortness of breath.     albuterol (PROAIR HFA) 108 (90 BASE) MCG/ACT inhaler Inhale 2 puffs into the lungs every 4 (four) hours as needed for wheezing. 3 Inhaler 0   aspirin EC 81 MG tablet Take 81 mg by mouth daily.     atorvastatin (LIPITOR) 10 MG tablet Take 10 mg  by mouth daily.     AURYXIA 1 GM 210 MG(Fe) tablet Take 420 mg by mouth 3 (three) times daily with meals.      b complex-vitamin c-folic acid (NEPHRO-VITE) 0.8 MG TABS Take 0.8 mg by mouth daily.      budesonide-formoterol (SYMBICORT) 80-4.5 MCG/ACT inhaler Inhale 2 puffs into the lungs 2 (two) times daily. 1 Inhaler 12   cinacalcet (SENSIPAR) 60 MG tablet Take 60 mg by mouth daily.     diphenhydramine-acetaminophen (TYLENOL PM) 25-500 MG TABS tablet Take 1 tablet by mouth at bedtime as needed (sleep).     DULoxetine (CYMBALTA) 30 MG capsule Take 30 mg by mouth daily.     gabapentin (NEURONTIN) 300 MG capsule Take 300 mg by mouth daily.     metoprolol tartrate (LOPRESSOR) 25 MG tablet Take 1.5 tablets (37.5 mg total) by mouth 2 (two) times  daily. 270 tablet 3   Omega-3 1000 MG CAPS Take 2,000 mg by mouth daily.     oxyCODONE-acetaminophen (PERCOCET) 7.5-325 MG tablet Take 1 tablet by mouth every 6 (six) hours as needed for moderate pain.     sevelamer carbonate (RENVELA) 800 MG tablet Take 1,600-3,200 mg by mouth See admin instructions. Take 3200 mg with meals and 1600 with snack     No current facility-administered medications for this visit.    Review of Systems  Constitutional:  Constitutional negative. HENT: HENT negative.  Eyes: Eyes negative.  Respiratory: Respiratory negative.  Cardiovascular: Cardiovascular negative.  GI: Gastrointestinal negative.  Musculoskeletal: Musculoskeletal negative.  Skin: Skin negative.  Neurological: Neurological negative. Hematologic: Hematologic/lymphatic negative.  Psychiatric: Psychiatric negative.        Objective:  Objective   Vitals:   01/05/23 1431  BP: 125/87  Pulse: 67  Resp: 18  Temp: 97.9 F (36.6 C)  SpO2: 96%      Physical Exam Cardiovascular:     Rate and Rhythm: Normal rate.     Pulses:          Radial pulses are 2+ on the right side and 2+ on the left side.  Pulmonary:     Effort: Pulmonary effort is normal.  Abdominal:     General: Abdomen is flat.  Musculoskeletal:     Comments: There are 2 areas of pseudoaneurysm degeneration on the right forearm.  The most proximal arm does not have any palpable flow and towards the wrist there is a thrill.  Neurological:     Mental Status: He is alert.  Psychiatric:        Mood and Affect: Mood normal.        Thought Content: Thought content normal.     Data:  +--------------------+----------+-----------------+--------+  AVF                PSV (cm/s)Flow Vol (mL/min)Comments  +--------------------+----------+-----------------+--------+  Native artery inflow   321                               +--------------------+----------+-----------------+--------+  AVF Anastomosis        228            310                 +--------------------+----------+-----------------+--------+     +------------+----------+-------------+----------+--------+  OUTFLOW VEINPSV (cm/s)Diameter (cm)Depth (cm)Describe  +------------+----------+-------------+----------+--------+  AC Fossa                  0.51  0.79             +------------+----------+-------------+----------+--------+  Prox Forearm    0         0.69        0.91             +------------+----------+-------------+----------+--------+  Mid Forearm     0         2.86        0.28             +------------+----------+-------------+----------+--------+  Dist Forearm    80        2.26        0.30             +------------+----------+-------------+----------+--------+        Summary:  Arteriovenous fistula-Occlusive thrombus noted in the mid and distal  segments of  the forearm with aneurysmal dilatations observed.      Assessment/Plan:    52 year old male with end-stage renal disease on dialysis via right arm AV fistula.  He is hoping to avoid a catheter has what appears to be subtotal occlusive thrombus in one of the 2 pseudoaneurysms in his forearm.  I have offered him grafting around the pseudoaneurysms versus conversion to upper arm access all of these would require temporary catheter which he is hopeful to avoid.  We discussed just revising the one pseudoaneurysm that has large clot burden and this is his preferred method.  They will have to cannulate the fistula at the area of pseudoaneurysm near the wrist and above the other pseudoaneurysm both pre and postoperatively and he demonstrates good understanding of this.  If they are unable to cannulate the fistula towards the antecubitum we will have to place a catheter at the time of procedure he demonstrates good understanding of this.  Will get him scheduled on a nondialysis day in the near future.     Maeola Harman MD Vascular and Vein  [facility]

## 2023-01-08 DIAGNOSIS — N186 End stage renal disease: Secondary | ICD-10-CM | POA: Diagnosis not present

## 2023-01-08 DIAGNOSIS — Z992 Dependence on renal dialysis: Secondary | ICD-10-CM | POA: Diagnosis not present

## 2023-01-10 ENCOUNTER — Other Ambulatory Visit: Payer: Self-pay

## 2023-01-10 DIAGNOSIS — N186 End stage renal disease: Secondary | ICD-10-CM

## 2023-01-10 DIAGNOSIS — Z992 Dependence on renal dialysis: Secondary | ICD-10-CM | POA: Diagnosis not present

## 2023-01-12 DIAGNOSIS — N186 End stage renal disease: Secondary | ICD-10-CM | POA: Diagnosis not present

## 2023-01-12 DIAGNOSIS — Z992 Dependence on renal dialysis: Secondary | ICD-10-CM | POA: Diagnosis not present

## 2023-01-14 DIAGNOSIS — N186 End stage renal disease: Secondary | ICD-10-CM | POA: Diagnosis not present

## 2023-01-14 DIAGNOSIS — Z992 Dependence on renal dialysis: Secondary | ICD-10-CM | POA: Diagnosis not present

## 2023-01-17 DIAGNOSIS — Z992 Dependence on renal dialysis: Secondary | ICD-10-CM | POA: Diagnosis not present

## 2023-01-17 DIAGNOSIS — N186 End stage renal disease: Secondary | ICD-10-CM | POA: Diagnosis not present

## 2023-01-19 DIAGNOSIS — Z992 Dependence on renal dialysis: Secondary | ICD-10-CM | POA: Diagnosis not present

## 2023-01-19 DIAGNOSIS — N186 End stage renal disease: Secondary | ICD-10-CM | POA: Diagnosis not present

## 2023-01-21 ENCOUNTER — Other Ambulatory Visit: Payer: Self-pay

## 2023-01-21 ENCOUNTER — Encounter (HOSPITAL_COMMUNITY): Payer: Self-pay | Admitting: Vascular Surgery

## 2023-01-21 DIAGNOSIS — N186 End stage renal disease: Secondary | ICD-10-CM | POA: Diagnosis not present

## 2023-01-21 DIAGNOSIS — Z992 Dependence on renal dialysis: Secondary | ICD-10-CM | POA: Diagnosis not present

## 2023-01-21 NOTE — Progress Notes (Addendum)
PCP - Quintin Alto, MD Cardiologist - Dina Rich MD  EKG - 07/09/2022 Chest x-ray - 07/05/2022 ECHO - 01/07/2022 Stress Test- 11/17/2018 CE ([facility]) Cardiac Cath - denies  Sleep Study Denies  DM- Denies   Blood Thinner Instructions: denies Aspirin Instructions: Ask MD  ERAS Protcol - N/A.  NPO ordered by MD COVID TEST- N/A  Anesthesia review: Yes, cardiax hx svts, htn, chf  -------------  SDW INSTRUCTIONS:  Your procedure is scheduled on Tuesday September 3rd. Please report to [facility] Main Entrance "A" at 0840 A.M., and check in at the Admitting office. Call this number if you have problems the morning of surgery: [phone]   Remember: Do not eat or drink after midnight the night before your surgery     Medications to take morning of surgery with a sip of water include: pregabalin (LYRICA)  atorvastatin (LIPITOR)  metoprolol tartrate  SYMBICORT 160-4.5 MCG/ACT inhaler   As NEEDED oxyCODONE-acetaminophen (PERCOCET)  albuterol (PROAIR HFA) (please bring on DOS)  As of today, STOP taking any Aspirin (unless otherwise instructed by your surgeon), Aleve, Naproxen, Ibuprofen, Motrin, Advil, Goody's, BC's, all herbal medications, fish oil, and all vitamins.    The Morning of Surgery Do not wear jewelry, make-up or nail polish. Do not wear lotions, powders, or perfumes/colognes, or deodorant Do not bring valuables to the hospital. [facility] is not responsible for any belongings or valuables.  If you are a smoker, DO NOT Smoke 24 hours prior to surgery  If you wear a CPAP at night please bring your mask the morning of surgery   Remember that you must have someone to transport you home after your surgery, and remain with you for 24 hours if you are discharged the same day.  Please bring cases for contacts, glasses, hearing aids, dentures or bridgework because it cannot be worn into surgery.   Patients discharged the day of surgery will not be allowed to  drive home.   Please shower the NIGHT BEFORE/MORNING OF SURGERY (use antibacterial soap like DIAL soap if possible). Wear comfortable clothes the morning of surgery. Oral Hygiene is also important to reduce your risk of infection.  Remember - BRUSH YOUR TEETH THE MORNING OF SURGERY WITH YOUR REGULAR TOOTHPASTE  Patient denies shortness of breath, fever, cough and chest pain.

## 2023-01-21 NOTE — Progress Notes (Signed)
Anesthesia Chart Review: Same day workup  Follows with cardiology for history of HTN, HFrEF with EF 35% in 2011- normalized to 60-65% by imaging in 12/2013 (most recent echo 12/2021 with EF 50-55%),  SVT maintained on metoprolol (ER visit on 07/05/22 with recurrent SVT, converted with adenosine).  Last seen by Dr. Dina Rich 07/08/2022.  Overall stable, Lopressor was increased to 37.5 mg twice daily due to recent breakthrough episode of SVT.  ESRD HD Monday Wednesday Friday via right AV fistula.  Recent fistulogram demonstrated thrombus within 2 areas of pseudoaneurysm particularly the pseudoaneurysm in the more proximal arm.  He also has a failed left forearm AV graft many years ago.  Current smoker with associated COPD, maintained on Symbicort and as needed albuterol.  Patient will need to have surgery labs and evaluation.  EKG 07/09/22: Sinus rhythm. Rate 81. Consider left atrial enlargement. Baseline wander in lead(s) V3  TTE 01/07/22:  1. Mild global hypokinesis worse in inferior base . Left ventricular  ejection fraction, by estimation, is 50 to 55%. The left ventricle has low  normal function. The left ventricle demonstrates global hypokinesis. The  left ventricular internal cavity  size was mildly dilated. There is mild left ventricular hypertrophy. Left  ventricular diastolic parameters were normal.   2. Right ventricular systolic function is normal. The right ventricular  size is normal.   3. Left atrial size was moderately dilated.   4. The mitral valve is normal in structure. Trivial mitral valve  regurgitation. No evidence of mitral stenosis.   5. The aortic valve is tricuspid. Aortic valve regurgitation is not  visualized. No aortic stenosis is present.   6. The inferior vena cava is normal in size with greater than 50%  respiratory variability, suggesting right atrial pressure of 3 mmHg.   Nuclear stress 04/23/19: No diagnostic ST segment changes to indicate  ischemia. Small, mild intensity, inferior defect that is partially reversible at the apex in the setting of increased radiotracer uptake on rest imaging near the inferior wall. Otherwise defect is fixed and suggestive of soft tissue attenuation. Cannot exclude a mild inferior apical ischemic territory, although with reduced specificity. This is a low risk study. Nuclear stress EF: 53%.   Zannie Cove [facility] Short Stay Center/Anesthesiology Phone [phone] 01/21/2023 10:32 AM

## 2023-01-21 NOTE — Anesthesia Preprocedure Evaluation (Signed)
Anesthesia Evaluation  Patient identified by MRN, date of birth, ID band Patient awake    Reviewed: Allergy & Precautions, NPO status , Patient's Chart, lab work & pertinent test results  Airway Mallampati: II       Dental  (+) Missing Gold Upper central:   Pulmonary COPD,  COPD inhaler, Current SmokerPatient did not abstain from smoking.   Pulmonary exam normal        Cardiovascular hypertension, Pt. on home beta blockers +CHF  Normal cardiovascular exam  ECHO: 1. Mild global hypokinesis worse in inferior base . Left ventricular  ejection fraction, by estimation, is 50 to 55%. The left ventricle has low  normal function. The left ventricle demonstrates global hypokinesis. The  left ventricular internal cavity  size was mildly dilated. There is mild left ventricular hypertrophy. Left  ventricular diastolic parameters were normal.   2. Right ventricular systolic function is normal. The right ventricular  size is normal.   3. Left atrial size was moderately dilated.   4. The mitral valve is normal in structure. Trivial mitral valve  regurgitation. No evidence of mitral stenosis.   5. The aortic valve is tricuspid. Aortic valve regurgitation is not  visualized. No aortic stenosis is present.   6. The inferior vena cava is normal in size with greater than 50%  respiratory variability, suggesting right atrial pressure of 3 mmHg.     Neuro/Psych negative neurological ROS  negative psych ROS   GI/Hepatic negative GI ROS, Neg liver ROS,,,  Endo/Other  negative endocrine ROS    Renal/GU ESRF and DialysisRenal disease     Musculoskeletal  (+) Arthritis ,    Abdominal  (+) + obese  Peds  Hematology negative hematology ROS (+)   Anesthesia Other Findings ESRD  Reproductive/Obstetrics                             Anesthesia Physical Anesthesia Plan  ASA: 3  Anesthesia Plan: General    Post-op Pain Management:    Induction: Intravenous  PONV Risk Score and Plan: 1 and Ondansetron, Dexamethasone and Treatment may vary due to age or medical condition  Airway Management Planned: LMA  Additional Equipment:   Intra-op Plan:   Post-operative Plan: Extubation in OR  Informed Consent: I have reviewed the patients History and Physical, chart, labs and discussed the procedure including the risks, benefits and alternatives for the proposed anesthesia with the patient or authorized representative who has indicated his/her understanding and acceptance.     Dental advisory given  Plan Discussed with: CRNA  Anesthesia Plan Comments: (PAT note by Antionette Poles, PA-C: Follows with cardiology for history of HTN, HFrEF with EF 35% in 2011- normalized to 60-65% by imaging in 12/2013 (most recent echo 12/2021 with EF 50-55%),  SVT maintained on metoprolol (ER visit on 07/05/22 with recurrent SVT, converted with adenosine).  Last seen by Dr. Dina Rich 07/08/2022.  Overall stable, Lopressor was increased to 37.5 mg twice daily due to recent breakthrough episode of SVT.  ESRD HD Monday Wednesday Friday via right AV fistula.  Recent fistulogram demonstrated thrombus within 2 areas of pseudoaneurysm particularly the pseudoaneurysm in the more proximal arm.  He also has a failed left forearm AV graft many years ago.  Current smoker with associated COPD, maintained on Symbicort and as needed albuterol.  Patient will need to have surgery labs and evaluation.  EKG 07/09/22: Sinus rhythm. Rate 81. Consider left atrial enlargement. Baseline  wander in lead(s) V3  TTE 01/07/22: 1. Mild global hypokinesis worse in inferior base . Left ventricular  ejection fraction, by estimation, is 50 to 55%. The left ventricle has low  normal function. The left ventricle demonstrates global hypokinesis. The  left ventricular internal cavity  size was mildly dilated. There is mild left ventricular  hypertrophy. Left  ventricular diastolic parameters were normal.  2. Right ventricular systolic function is normal. The right ventricular  size is normal.  3. Left atrial size was moderately dilated.  4. The mitral valve is normal in structure. Trivial mitral valve  regurgitation. No evidence of mitral stenosis.  5. The aortic valve is tricuspid. Aortic valve regurgitation is not  visualized. No aortic stenosis is present.  6. The inferior vena cava is normal in size with greater than 50%  respiratory variability, suggesting right atrial pressure of 3 mmHg.   Nuclear stress 04/23/19:  No diagnostic ST segment changes to indicate ischemia.  Small, mild intensity, inferior defect that is partially reversible at the apex in the setting of increased radiotracer uptake on rest imaging near the inferior wall. Otherwise defect is fixed and suggestive of soft tissue attenuation. Cannot exclude a mild inferior apical ischemic territory, although with reduced specificity.  This is a low risk study.  Nuclear stress EF: 53%.  )        Anesthesia Quick Evaluation

## 2023-01-22 DIAGNOSIS — N186 End stage renal disease: Secondary | ICD-10-CM | POA: Diagnosis not present

## 2023-01-22 DIAGNOSIS — Z992 Dependence on renal dialysis: Secondary | ICD-10-CM | POA: Diagnosis not present

## 2023-01-24 DIAGNOSIS — Z992 Dependence on renal dialysis: Secondary | ICD-10-CM | POA: Diagnosis not present

## 2023-01-24 DIAGNOSIS — N186 End stage renal disease: Secondary | ICD-10-CM | POA: Diagnosis not present

## 2023-01-25 ENCOUNTER — Ambulatory Visit (HOSPITAL_BASED_OUTPATIENT_CLINIC_OR_DEPARTMENT_OTHER): Payer: 59 | Admitting: Physician Assistant

## 2023-01-25 ENCOUNTER — Encounter (HOSPITAL_COMMUNITY): Payer: Self-pay | Admitting: Vascular Surgery

## 2023-01-25 ENCOUNTER — Other Ambulatory Visit: Payer: Self-pay

## 2023-01-25 ENCOUNTER — Ambulatory Visit (HOSPITAL_COMMUNITY)
Admission: RE | Admit: 2023-01-25 | Discharge: 2023-01-25 | Disposition: A | Discharge status: Home / Self Care | Payer: 59 | Attending: Vascular Surgery | Admitting: Vascular Surgery

## 2023-01-25 ENCOUNTER — Ambulatory Visit (HOSPITAL_COMMUNITY): Payer: Self-pay | Admitting: Physician Assistant

## 2023-01-25 ENCOUNTER — Encounter (HOSPITAL_COMMUNITY)
Admission: RE | Disposition: A | Discharge status: Home / Self Care | Payer: Self-pay | Source: Home / Self Care | Attending: Vascular Surgery

## 2023-01-25 ENCOUNTER — Ambulatory Visit (HOSPITAL_COMMUNITY): Payer: 59

## 2023-01-25 DIAGNOSIS — T82898A Other specified complication of vascular prosthetic devices, implants and grafts, initial encounter: Secondary | ICD-10-CM

## 2023-01-25 DIAGNOSIS — Y828 Other medical devices associated with adverse incidents: Secondary | ICD-10-CM | POA: Diagnosis not present

## 2023-01-25 DIAGNOSIS — Z992 Dependence on renal dialysis: Secondary | ICD-10-CM

## 2023-01-25 DIAGNOSIS — N186 End stage renal disease: Secondary | ICD-10-CM | POA: Diagnosis not present

## 2023-01-25 DIAGNOSIS — F17210 Nicotine dependence, cigarettes, uncomplicated: Secondary | ICD-10-CM

## 2023-01-25 DIAGNOSIS — I5032 Chronic diastolic (congestive) heart failure: Secondary | ICD-10-CM | POA: Insufficient documentation

## 2023-01-25 DIAGNOSIS — T82868A Thrombosis of vascular prosthetic devices, implants and grafts, initial encounter: Secondary | ICD-10-CM | POA: Insufficient documentation

## 2023-01-25 DIAGNOSIS — N185 Chronic kidney disease, stage 5: Secondary | ICD-10-CM

## 2023-01-25 DIAGNOSIS — N2581 Secondary hyperparathyroidism of renal origin: Secondary | ICD-10-CM | POA: Insufficient documentation

## 2023-01-25 DIAGNOSIS — I132 Hypertensive heart and chronic kidney disease with heart failure and with stage 5 chronic kidney disease, or end stage renal disease: Secondary | ICD-10-CM | POA: Diagnosis not present

## 2023-01-25 DIAGNOSIS — Z7951 Long term (current) use of inhaled steroids: Secondary | ICD-10-CM | POA: Diagnosis not present

## 2023-01-25 DIAGNOSIS — E669 Obesity, unspecified: Secondary | ICD-10-CM | POA: Insufficient documentation

## 2023-01-25 DIAGNOSIS — J4489 Other specified chronic obstructive pulmonary disease: Secondary | ICD-10-CM | POA: Diagnosis not present

## 2023-01-25 DIAGNOSIS — Z87891 Personal history of nicotine dependence: Secondary | ICD-10-CM | POA: Diagnosis not present

## 2023-01-25 HISTORY — PX: THROMBECTOMY W/ EMBOLECTOMY: SHX2507

## 2023-01-25 HISTORY — PX: REVISON OF ARTERIOVENOUS FISTULA: SHX6074

## 2023-01-25 HISTORY — PX: VENOGRAM: SHX5497

## 2023-01-25 LAB — POCT I-STAT, CHEM 8
BUN: 86 mg/dL — ABNORMAL HIGH (ref 6–20)
Calcium, Ion: 0.97 mmol/L — ABNORMAL LOW (ref 1.15–1.40)
Chloride: 103 mmol/L (ref 98–111)
Creatinine, Ser: 15.5 mg/dL — ABNORMAL HIGH (ref 0.61–1.24)
Glucose, Bld: 88 mg/dL (ref 70–99)
HCT: 51 % (ref 39.0–52.0)
Hemoglobin: 17.3 g/dL — ABNORMAL HIGH (ref 13.0–17.0)
Potassium: 5.5 mmol/L — ABNORMAL HIGH (ref 3.5–5.1)
Sodium: 136 mmol/L (ref 135–145)
TCO2: 26 mmol/L (ref 22–32)

## 2023-01-25 SURGERY — REVISON OF ARTERIOVENOUS FISTULA
Anesthesia: General | Laterality: Right

## 2023-01-25 MED ORDER — HEPARIN SODIUM (PORCINE) 1000 UNIT/ML IJ SOLN
INTRAMUSCULAR | Status: AC
  Filled 2023-01-25: qty 10

## 2023-01-25 MED ORDER — PROTAMINE SULFATE 10 MG/ML IV SOLN
INTRAVENOUS | Status: DC | PRN
  Administered 2023-01-25: 25 mg via INTRAVENOUS

## 2023-01-25 MED ORDER — FENTANYL CITRATE (PF) 250 MCG/5ML IJ SOLN
INTRAMUSCULAR | Status: DC | PRN
  Administered 2023-01-25 (×2): 50 ug via INTRAVENOUS

## 2023-01-25 MED ORDER — PROMETHAZINE HCL 25 MG/ML IJ SOLN: 6.2500 mg | INTRAMUSCULAR | Status: DC | PRN

## 2023-01-25 MED ORDER — SODIUM CHLORIDE 0.9 % IV SOLN: INTRAVENOUS | Status: DC

## 2023-01-25 MED ORDER — DEXAMETHASONE SODIUM PHOSPHATE 10 MG/ML IJ SOLN
INTRAMUSCULAR | Status: DC | PRN
  Administered 2023-01-25: 10 mg via INTRAVENOUS

## 2023-01-25 MED ORDER — OXYCODONE HCL 5 MG PO TABS: 5.0000 mg | ORAL_TABLET | Freq: Once | ORAL | Status: DC | PRN

## 2023-01-25 MED ORDER — PHENYLEPHRINE HCL-NACL 20-0.9 MG/250ML-% IV SOLN
INTRAVENOUS | Status: DC | PRN
  Administered 2023-01-25: 20 ug/min via INTRAVENOUS

## 2023-01-25 MED ORDER — MIDAZOLAM HCL 2 MG/2ML IJ SOLN
INTRAMUSCULAR | Status: AC
  Filled 2023-01-25: qty 2

## 2023-01-25 MED ORDER — ACETAMINOPHEN 10 MG/ML IV SOLN: 1000.0000 mg | Freq: Once | INTRAVENOUS | Status: DC | PRN

## 2023-01-25 MED ORDER — HEPARIN 6000 UNIT IRRIGATION SOLUTION
Status: DC | PRN
  Administered 2023-01-25: 1

## 2023-01-25 MED ORDER — ONDANSETRON HCL 4 MG/2ML IJ SOLN
INTRAMUSCULAR | Status: AC
  Filled 2023-01-25: qty 2

## 2023-01-25 MED ORDER — PROPOFOL 10 MG/ML IV BOLUS
INTRAVENOUS | Status: AC
  Filled 2023-01-25: qty 20

## 2023-01-25 MED ORDER — CHLORHEXIDINE GLUCONATE 0.12 % MT SOLN
OROMUCOSAL | Status: AC
  Administered 2023-01-25: 15 mL via OROMUCOSAL
  Filled 2023-01-25: qty 15

## 2023-01-25 MED ORDER — OXYCODONE HCL 5 MG/5ML PO SOLN: 5.0000 mg | Freq: Once | ORAL | Status: DC | PRN

## 2023-01-25 MED ORDER — FENTANYL CITRATE (PF) 100 MCG/2ML IJ SOLN
INTRAMUSCULAR | Status: AC
  Filled 2023-01-25: qty 2

## 2023-01-25 MED ORDER — IODIXANOL 320 MG/ML IV SOLN
INTRAVENOUS | Status: DC | PRN
Start: 2023-01-25 — End: 2023-01-25
  Administered 2023-01-25: 25 mL via INTRAVENOUS

## 2023-01-25 MED ORDER — IPRATROPIUM-ALBUTEROL 0.5-2.5 (3) MG/3ML IN SOLN
3.0000 mL | Freq: Once | RESPIRATORY_TRACT | Status: AC
  Administered 2023-01-25: 3 mL via RESPIRATORY_TRACT

## 2023-01-25 MED ORDER — ORAL CARE MOUTH RINSE: 15.0000 mL | Freq: Once | OROMUCOSAL | Status: AC

## 2023-01-25 MED ORDER — HEMOSTATIC AGENTS (NO CHARGE) OPTIME
TOPICAL | Status: DC | PRN
  Administered 2023-01-25: 1 via TOPICAL

## 2023-01-25 MED ORDER — LIDOCAINE 2% (20 MG/ML) 5 ML SYRINGE
INTRAMUSCULAR | Status: DC | PRN
  Administered 2023-01-25: 60 mg via INTRAVENOUS

## 2023-01-25 MED ORDER — CHLORHEXIDINE GLUCONATE 4 % EX SOLN: 60.0000 mL | Freq: Once | CUTANEOUS | Status: DC

## 2023-01-25 MED ORDER — HEPARIN 6000 UNIT IRRIGATION SOLUTION
Status: AC
  Filled 2023-01-25: qty 500

## 2023-01-25 MED ORDER — LIDOCAINE 2% (20 MG/ML) 5 ML SYRINGE
INTRAMUSCULAR | Status: AC
  Filled 2023-01-25: qty 5

## 2023-01-25 MED ORDER — ONDANSETRON HCL 4 MG/2ML IJ SOLN
INTRAMUSCULAR | Status: DC | PRN
  Administered 2023-01-25: 4 mg via INTRAVENOUS

## 2023-01-25 MED ORDER — 0.9 % SODIUM CHLORIDE (POUR BTL) OPTIME
TOPICAL | Status: DC | PRN
  Administered 2023-01-25: 1000 mL

## 2023-01-25 MED ORDER — FENTANYL CITRATE (PF) 250 MCG/5ML IJ SOLN
INTRAMUSCULAR | Status: AC
  Filled 2023-01-25: qty 5

## 2023-01-25 MED ORDER — EPHEDRINE SULFATE-NACL 50-0.9 MG/10ML-% IV SOSY
PREFILLED_SYRINGE | INTRAVENOUS | Status: DC | PRN
  Administered 2023-01-25: 5 mg via INTRAVENOUS

## 2023-01-25 MED ORDER — OXYCODONE-ACETAMINOPHEN 10-325 MG PO TABS: 1.0000 | ORAL_TABLET | ORAL | 0 refills | Status: DC | PRN

## 2023-01-25 MED ORDER — PROPOFOL 10 MG/ML IV BOLUS
INTRAVENOUS | Status: DC | PRN
  Administered 2023-01-25: 250 mg via INTRAVENOUS

## 2023-01-25 MED ORDER — CHLORHEXIDINE GLUCONATE 0.12 % MT SOLN: 15.0000 mL | Freq: Once | OROMUCOSAL | Status: AC

## 2023-01-25 MED ORDER — IPRATROPIUM-ALBUTEROL 0.5-2.5 (3) MG/3ML IN SOLN
RESPIRATORY_TRACT | Status: AC
  Filled 2023-01-25: qty 3

## 2023-01-25 MED ORDER — ROCURONIUM BROMIDE 10 MG/ML (PF) SYRINGE
PREFILLED_SYRINGE | INTRAVENOUS | Status: AC
  Filled 2023-01-25: qty 10

## 2023-01-25 MED ORDER — DEXAMETHASONE SODIUM PHOSPHATE 10 MG/ML IJ SOLN
INTRAMUSCULAR | Status: AC
  Filled 2023-01-25: qty 1

## 2023-01-25 MED ORDER — HEPARIN SODIUM (PORCINE) 1000 UNIT/ML IJ SOLN
INTRAMUSCULAR | Status: DC | PRN
  Administered 2023-01-25: 3000 [IU] via INTRAVENOUS
  Administered 2023-01-25: 5000 [IU] via INTRAVENOUS

## 2023-01-25 MED ORDER — FENTANYL CITRATE (PF) 100 MCG/2ML IJ SOLN: 25.0000 ug | INTRAMUSCULAR | Status: DC | PRN

## 2023-01-25 MED ORDER — CEFAZOLIN IN SODIUM CHLORIDE 3-0.9 GM/100ML-% IV SOLN
3.0000 g | INTRAVENOUS | Status: AC
  Administered 2023-01-25: 3 g via INTRAVENOUS
  Filled 2023-01-25: qty 100

## 2023-01-25 SURGICAL SUPPLY — 58 items
ADH SKN CLS APL DERMABOND .7 (GAUZE/BANDAGES/DRESSINGS) ×2
ADH SKN CLS LQ APL DERMABOND (GAUZE/BANDAGES/DRESSINGS) ×2
ARMBAND PINK RESTRICT EXTREMIT (MISCELLANEOUS) ×2 IMPLANT
BAG COUNTER SPONGE SURGICOUNT (BAG) ×2 IMPLANT
BAG SPNG CNTER NS LX DISP (BAG) ×2
BALLN MUSTANG 5X60X75 (BALLOONS) ×2
BALLOON MUSTANG 5X60X75 (BALLOONS) IMPLANT
BNDG CMPR 5X4 KNIT ELC UNQ LF (GAUZE/BANDAGES/DRESSINGS) ×2
BNDG CMPR 9X4 STRL LF SNTH (GAUZE/BANDAGES/DRESSINGS) ×2
BNDG ELASTIC 4INX 5YD STR LF (GAUZE/BANDAGES/DRESSINGS) IMPLANT
BNDG ESMARK 4X9 LF (GAUZE/BANDAGES/DRESSINGS) IMPLANT
CANISTER SUCT 3000ML PPV (MISCELLANEOUS) ×2 IMPLANT
CATH BEACON 5 .035 40 KMP TP (CATHETERS) IMPLANT
CATH EMB 4FR 40 (CATHETERS) IMPLANT
CATH THROMBEC 7F 135 CLEANER15 (MISCELLANEOUS) IMPLANT
CLIP LIGATING EXTRA MED SLVR (CLIP) ×2 IMPLANT
CLIP LIGATING EXTRA SM BLUE (MISCELLANEOUS) ×2 IMPLANT
CNTNR URN SCR LID CUP LEK RST (MISCELLANEOUS) IMPLANT
CONT SPEC 4OZ STRL OR WHT (MISCELLANEOUS) ×2
COVER PROBE W GEL 5X96 (DRAPES) ×2 IMPLANT
CUFF TOURN SGL QUICK 18X4 (TOURNIQUET CUFF) IMPLANT
DERMABOND ADVANCED .7 DNX12 (GAUZE/BANDAGES/DRESSINGS) ×2 IMPLANT
DERMABOND ADVANCED .7 DNX6 (GAUZE/BANDAGES/DRESSINGS) IMPLANT
DRAPE C-ARM 42X120 X-RAY (DRAPES) IMPLANT
DRSG COVADERM 4X8 (GAUZE/BANDAGES/DRESSINGS) IMPLANT
ELECT REM PT RETURN 9FT ADLT (ELECTROSURGICAL) ×2
ELECTRODE REM PT RTRN 9FT ADLT (ELECTROSURGICAL) ×2 IMPLANT
GLIDEWIRE ADV .035X180CM (WIRE) IMPLANT
GLOVE BIO SURGEON STRL SZ7.5 (GLOVE) ×2 IMPLANT
GOWN STRL REUS W/ TWL LRG LVL3 (GOWN DISPOSABLE) ×4 IMPLANT
GOWN STRL REUS W/ TWL XL LVL3 (GOWN DISPOSABLE) ×2 IMPLANT
GOWN STRL REUS W/TWL LRG LVL3 (GOWN DISPOSABLE) ×4
GOWN STRL REUS W/TWL XL LVL3 (GOWN DISPOSABLE) ×2
KIT BASIN OR (CUSTOM PROCEDURE TRAY) ×2 IMPLANT
KIT ENCORE 26 ADVANTAGE (KITS) IMPLANT
KIT TURNOVER KIT B (KITS) ×2 IMPLANT
NS IRRIG 1000ML POUR BTL (IV SOLUTION) ×2 IMPLANT
PACK CV ACCESS (CUSTOM PROCEDURE TRAY) ×2 IMPLANT
PAD ARMBOARD 7.5X6 YLW CONV (MISCELLANEOUS) ×4 IMPLANT
POWDER SURGICEL 3.0 GRAM (HEMOSTASIS) IMPLANT
SET MICROPUNCTURE 5F STIFF (MISCELLANEOUS) IMPLANT
SHEATH PINNACLE R/O II 7F 4CM (SHEATH) IMPLANT
SLING ARM FOAM STRAP LRG (SOFTGOODS) IMPLANT
SLING ARM FOAM STRAP MED (SOFTGOODS) IMPLANT
SPONGE T-LAP 18X18 ~~LOC~~+RFID (SPONGE) IMPLANT
STOPCOCK MORSE 400PSI 3WAY (MISCELLANEOUS) IMPLANT
SUT MNCRL AB 4-0 PS2 18 (SUTURE) ×2 IMPLANT
SUT PROLENE 5 0 C 1 24 (SUTURE) IMPLANT
SUT PROLENE 6 0 BV (SUTURE) ×2 IMPLANT
SUT VIC AB 3-0 SH 27 (SUTURE) ×2
SUT VIC AB 3-0 SH 27X BRD (SUTURE) ×2 IMPLANT
SYR 10ML LL (SYRINGE) IMPLANT
SYR 20ML LL LF (SYRINGE) IMPLANT
TOWEL GREEN STERILE (TOWEL DISPOSABLE) ×2 IMPLANT
TUBING CIL FLEX 10 FLL-RA (TUBING) IMPLANT
UNDERPAD 30X36 HEAVY ABSORB (UNDERPADS AND DIAPERS) ×2 IMPLANT
WATER STERILE IRR 1000ML POUR (IV SOLUTION) ×2 IMPLANT
WIRE BENTSON .035X145CM (WIRE) IMPLANT

## 2023-01-25 NOTE — Interval H&P Note (Signed)
History and Physical Interval Note:  01/25/2023 10:03 AM  Brian Barrera  has presented today for surgery, with the diagnosis of ESRD.  The various methods of treatment have been discussed with the patient and family. After consideration of risks, benefits and other options for treatment, the patient has consented to  Procedure(s): REVISION OF RIGHT ARM ARTERIOVENOUS FISTULA (Right) as a surgical intervention.  The patient's history has been reviewed, patient examined, no change in status, stable for surgery.  I have reviewed the patient's chart and labs.  Questions were answered to the patient's satisfaction.     Lemar Livings

## 2023-01-25 NOTE — Anesthesia Procedure Notes (Signed)
Procedure Name: LMA Insertion Date/Time: 01/25/2023 10:29 AM  Performed by: Alwyn Ren, CRNAPre-anesthesia Checklist: Timeout performed, Patient identified, Emergency Drugs available, Suction available and Patient being monitored Patient Re-evaluated:Patient Re-evaluated prior to induction Oxygen Delivery Method: Circle system utilized Preoxygenation: Pre-oxygenation with 100% oxygen Induction Type: IV induction LMA: LMA inserted LMA Size: 5.0 Number of attempts: 1 Placement Confirmation: positive ETCO2, CO2 detector and breath sounds checked- equal and bilateral Tube secured with: Tape

## 2023-01-25 NOTE — Transfer of Care (Signed)
Immediate Anesthesia Transfer of Care Note  Patient: Brian Barrera  Procedure(s) Performed: REVISION OF RIGHT ARM ARTERIOVENOUS FISTULA (Right) RIGHT ARM FISTULOGRAM (Right) THROMBECTOMY RIGHT ARTERIOVENOUS FISTULA WITH BALLOON ANGIOPLASTY  Patient Location: PACU  Anesthesia Type:General  Level of Consciousness: awake and alert   Airway & Oxygen Therapy: Patient Spontanous Breathing and Patient connected to face mask oxygen  Post-op Assessment: Report given to RN, Post -op Vital signs reviewed and stable, and Patient moving all extremities X 4  Post vital signs: Reviewed and stable  Last Vitals:  Vitals Value Taken Time  BP 131/97 01/25/23 1220  Temp    Pulse 68 01/25/23 1223  Resp 18 01/25/23 1223  SpO2 96 % 01/25/23 1223  Vitals shown include unfiled device data.  Last Pain:  Vitals:   01/25/23 0938  PainSc: 5          Complications: No notable events documented.

## 2023-01-25 NOTE — Discharge Instructions (Signed)

## 2023-01-25 NOTE — Op Note (Addendum)
Patient name: RONI STARR MRN: [id number] DOB: [birth year] Sex: male  01/25/2023 Pre-operative Diagnosis: Malfunction right arm AV fistula, end-stage renal disease Post-operative diagnosis:  Same Surgeon:  Luanna Salk. Randie Heinz, MD Assistant: Aggie Moats, PA Procedure Performed: 1.  Open revision right arm AV fistula pseudoaneurysm with 4 Fogarty thrombectomy 2.  Mechanical thrombectomy right AV fistula with cleaner wire 3.  Balloon angioplasty right forearm AV fistula with 5 mm balloon  Indications: 52 year old male with history of end-stage renal disease has been on dialysis for 10 years via right forearm AV fistula which is radiocephalic.  He now has thrombosis of 1 of 2 pseudoaneurysms and is obtaining dialysis via only 1 pseudoaneurysm in the forearm.  He is indicated for revision of 1 pseudoaneurysm with thrombectomy.  Patient is reluctant to proceed with tunneled dialysis catheter placement.  An experienced assistant was necessary to facilitate exposure of the fistula as well as perform thrombectomy and closure.  Findings: The more cephalad pseudoaneurysm was totally thrombosed as well as the runoff.  The only flow into the fistula was into the pseudoaneurysm that was more distal on the forearm where he is obtaining both needles.  We initially performed thrombectomy of the pseudoaneurysm and revision of the more cephalad pseudoaneurysm and also thrombectomy of the more distal pseudoaneurysm as well and thrombectomy of the fistula in the forearm with a 4 Fogarty.  After all of this revision there was still thrombosed outflow.  We then cannulated the open pseudoaneurysm with a micropuncture needle and a sheath and perform fistulogram which demonstrated no outflow.  We performed thrombectomy with a cleaner wire and then balloon venoplasty with 5 mm balloon and there was runoff through the forearm into the basilic vein in the upper arm which was patent.  If patient has malfunction of his  fistula he will need tunneled dialysis catheter followed by either conversion to forearm AV graft versus upper arm basilic vein fistula.   Procedure:  The patient was identified in the holding area and taken to the operating room where he was placed bilaterally table and LMA anesthesia was induced.  He was sterilely prepped and draped in the right upper extremity usual fashion, antibiotics were administered and a timeout was called.  3000 units of heparin was administered and a tourniquet was placed above the elbow and the arm was exsanguinated and tourniquet was inflated to 250 mmHg.  We performed a elliptical incision over the thrombosed pseudoaneurysm.  We dissected down to the pseudoaneurysm and freed this up from the surrounding skin and excised an ellipse of skin.  We then excised an ellipse of the pseudoaneurysm.  There was significant thrombus within this we able to mechanically remove this as well as thrombus from the more distal pseudoaneurysm.  An additional 5000 units of heparin was administered.  I then passed a 4 Fogarty but this would really not pass beyond the tourniquet and did not return significant thrombus.  We then flushed with heparinized saline and closed the pseudoaneurysm with running 5-0 Prolene suture in a mattress fashion.  The tourniquet was allowed down and there was pulsatility into the patent pseudoaneurysm and minimal flow into the pseudoaneurysm that we revised.  We then turned our attention to the patent pseudoaneurysm towards the wrist.  This was cannulated with a micropuncture needle followed by wire and a sheath.  Upper extremity fistulogram was performed.  We then used a cleaner wire to perform thrombectomy and then passed a wire into the basilic vein confirmed  intraluminal access and perform balloon venoplasty of the outflow with 5 mm balloon through multiple areas and this was marked on the skin where we could palpate the balloon.  At completion there was a hematoma on the  lateral forearm but this did not appear to be anything drainable by ultrasound and there was no active extravasation by completion fistulogram.  We then remove the wire and balloon and suture-ligated the cannulation site with 4-0 Monocryl.  We then freed up some of the soft tissue overlying the fistula heading up the forearm and had to repair 1 area of the fistula with 5-0 Prolene suture.  The wound was irrigated hemostasis was obtained and we closed the skin over the revised pseudoaneurysm with staples and a sterile dressing was applied and the forearm and hand and wrist were wrapped with gentle 4 inch Ace wrap.  The patient was then awakened from anesthesia having tolerated procedure without any complication.  All counts were correct at completion.   Contrast: 25 cc  EBL: 500 cc     Naphtali Zywicki C. Randie Heinz, MD [facility] Office: [phone] Pager: [phone]

## 2023-01-26 ENCOUNTER — Encounter (HOSPITAL_COMMUNITY): Payer: Self-pay | Admitting: Vascular Surgery

## 2023-01-26 DIAGNOSIS — N186 End stage renal disease: Secondary | ICD-10-CM | POA: Diagnosis not present

## 2023-01-26 DIAGNOSIS — Z992 Dependence on renal dialysis: Secondary | ICD-10-CM | POA: Diagnosis not present

## 2023-01-26 NOTE — Anesthesia Postprocedure Evaluation (Signed)
Anesthesia Post Note  Patient: Janetta Hora Dierolf  Procedure(s) Performed: REVISION OF RIGHT ARM ARTERIOVENOUS FISTULA (Right) RIGHT ARM FISTULOGRAM (Right) THROMBECTOMY RIGHT ARTERIOVENOUS FISTULA WITH BALLOON ANGIOPLASTY     Patient location during evaluation: PACU Anesthesia Type: General Level of consciousness: awake Pain management: pain level controlled Vital Signs Assessment: post-procedure vital signs reviewed and stable Respiratory status: spontaneous breathing, nonlabored ventilation and respiratory function stable Cardiovascular status: blood pressure returned to baseline and stable Postop Assessment: no apparent nausea or vomiting Anesthetic complications: no   No notable events documented.  Last Vitals:  Vitals:   01/25/23 1300 01/25/23 1315  BP: (!) 133/95 (!) 122/96  Pulse: 65 66  Resp: 19 19  Temp:  36.6 C  SpO2: 94% 94%    Last Pain:  Vitals:   01/25/23 0938  PainSc: 5                  Ger Ringenberg P Kirstie Larsen

## 2023-01-28 ENCOUNTER — Other Ambulatory Visit: Payer: Self-pay | Admitting: Student

## 2023-01-28 DIAGNOSIS — N186 End stage renal disease: Secondary | ICD-10-CM | POA: Diagnosis not present

## 2023-01-28 DIAGNOSIS — Z992 Dependence on renal dialysis: Secondary | ICD-10-CM | POA: Diagnosis not present

## 2023-01-31 DIAGNOSIS — Z992 Dependence on renal dialysis: Secondary | ICD-10-CM | POA: Diagnosis not present

## 2023-01-31 DIAGNOSIS — N186 End stage renal disease: Secondary | ICD-10-CM | POA: Diagnosis not present

## 2023-02-01 ENCOUNTER — Encounter: Payer: Self-pay | Admitting: Cardiology

## 2023-02-01 ENCOUNTER — Ambulatory Visit: Payer: 59 | Attending: Cardiology | Admitting: Cardiology

## 2023-02-01 VITALS — BP 102/70 | HR 76 | Ht 72.0 in | Wt 271.2 lb

## 2023-02-01 DIAGNOSIS — I10 Essential (primary) hypertension: Secondary | ICD-10-CM | POA: Diagnosis not present

## 2023-02-01 DIAGNOSIS — I471 Supraventricular tachycardia, unspecified: Secondary | ICD-10-CM

## 2023-02-01 DIAGNOSIS — E782 Mixed hyperlipidemia: Secondary | ICD-10-CM

## 2023-02-01 NOTE — Progress Notes (Signed)
Clinical Summary Brian Barrera is a 52 y.o.male seen today for follow up of the following medical problems   1.SVT - followed by EP -has been on metoprolol, consideration for ablation if symptoms were to worsen per EP note - ER visit 07/05/22 with recurrent SVT, converted with adenosine.    - isolated episode of palpitations, took extra metoprolol and resolved    2.ESRD - takes midodrine on HD days, no recent issues with low bp's on HD>    3. HTN - compliant with meds   4. HFimpEF - EF 35% in 2011, normalized to 60-65% by imaging in 12/2013  12/2021 echo LVEF 50-55% - no recent SOB/DOE.   5. Hyperlipidemia - labs followed by pcp - 12/2022 TC 143 TG 157 HDL 37 LDL 79 Past Medical History:  Diagnosis Date   Anemia    Attributed to chronic disease/chronic kidney disease   CHF (congestive heart failure) (HCC)    Chronic diastolic heart failure (HCC) 05/24/2009   EF 35% by echo in 2011; echo in 08/2011-normal EF, moderate to severe LVH; BNP level of [phone] in 2011-12   Chronic obstructive pulmonary disease (HCC)    with asthmatic component   ESRD (end stage renal disease) on dialysis (HCC)    secondary hyperparathyroidism   Hypertension    h/o hypertensive crisis with encephalopathy   Obesity    Tobacco abuse    Approximate consumption of 25 pack years; continuing at 0.5 pack per day     No Known Allergies   Current Outpatient Medications  Medication Sig Dispense Refill   albuterol (ACCUNEB) 1.25 MG/3ML nebulizer solution Take 1 ampule by nebulization every 4 (four) hours as needed for wheezing or shortness of breath.     albuterol (PROAIR HFA) 108 (90 BASE) MCG/ACT inhaler Inhale 2 puffs into the lungs every 4 (four) hours as needed for wheezing. 3 Inhaler 0   aspirin EC 81 MG tablet Take 81 mg by mouth daily.     atorvastatin (LIPITOR) 10 MG tablet Take 10 mg by mouth daily.     AURYXIA 1 GM 210 MG(Fe) tablet Take 420 mg by mouth 3 (three) times daily with  meals. 420 with snack     b complex-vitamin c-folic acid (NEPHRO-VITE) 0.8 MG TABS Take 1 tablet by mouth daily.     cinacalcet (SENSIPAR) 60 MG tablet Take 60 mg by mouth daily.     diphenhydramine-acetaminophen (TYLENOL PM) 25-500 MG TABS tablet Take 1 tablet by mouth at bedtime as needed (sleep).     DULoxetine (CYMBALTA) 60 MG capsule Take 60 mg by mouth daily.     metoprolol tartrate (LOPRESSOR) 25 MG tablet Take 1.5 tablets (37.5 mg total) by mouth 2 (two) times daily. 270 tablet 3   midodrine (PROAMATINE) 5 MG tablet Take 5 mg by mouth every Monday, Wednesday, and Friday.     Omega-3 1000 MG CAPS Take 2,000 mg by mouth daily.     oxyCODONE-acetaminophen (PERCOCET) 10-325 MG tablet Take 1 tablet by mouth every 4 (four) hours as needed for pain. 30 tablet 0   pregabalin (LYRICA) 75 MG capsule Take 75 mg by mouth 2 (two) times daily.     sevelamer carbonate (RENVELA) 800 MG tablet Take 1,600-3,200 mg by mouth See admin instructions. Take 3200 mg with meals and 1600 with snack     SYMBICORT 160-4.5 MCG/ACT inhaler Inhale 2 puffs into the lungs 2 (two) times daily.     No current facility-administered medications for  this visit.     Past Surgical History:  Procedure Laterality Date   ARTERIOVENOUS GRAFT PLACEMENT  2011   COLONOSCOPY N/A 02/04/2014   Procedure: COLONOSCOPY;  Surgeon: Corbin Ade, MD;  Location: [facility];  Service: Endoscopy;  Laterality: N/A;  12:00   COLONOSCOPY N/A 07/03/2019   Procedure: COLONOSCOPY;  Surgeon: Corbin Ade, MD;  Location: [facility];  Service: Endoscopy;  Laterality: N/A;  12:00-office rescheduled 2/9 @ 11:00am   POLYPECTOMY  07/03/2019   Procedure: POLYPECTOMY;  Surgeon: Corbin Ade, MD;  Location: [facility];  Service: Endoscopy;;   REVISON OF ARTERIOVENOUS FISTULA Right 01/25/2023   Procedure: REVISION OF RIGHT ARM ARTERIOVENOUS FISTULA;  Surgeon: Maeola Harman, MD;  Location: [facility] OR;  Service: Vascular;  Laterality: Right;    THROMBECTOMY W/ EMBOLECTOMY  01/25/2023   Procedure: THROMBECTOMY RIGHT ARTERIOVENOUS FISTULA WITH BALLOON ANGIOPLASTY;  Surgeon: Maeola Harman, MD;  Location: [facility] OR;  Service: Vascular;;   VENOGRAM Right 01/25/2023   Procedure: RIGHT ARM FISTULOGRAM;  Surgeon: Maeola Harman, MD;  Location: [facility] OR;  Service: Vascular;  Laterality: Right;     No Known Allergies    Family History  Problem Relation Age of Onset   Heart disease Father    Diabetes Sister    Hypertension Sister    Diabetes Sister    Hypertension Sister    Kidney disease Mother    Hypertension Mother    Hypertension Sister    Hypertension Brother    Hypertension Brother    Hypertension Brother    Colon cancer Neg Hx      Social History Brian Barrera reports that he has been smoking cigarettes. He started smoking about 37 years ago. He has a 18.7 pack-year smoking history. He has never used smokeless tobacco. Brian Barrera reports no history of alcohol use.   Review of Systems CONSTITUTIONAL: No weight loss, fever, chills, weakness or fatigue.  HEENT: Eyes: No visual loss, blurred vision, double vision or yellow sclerae.No hearing loss, sneezing, congestion, runny nose or sore throat.  SKIN: No rash or itching.  CARDIOVASCULAR: per hpi RESPIRATORY: No shortness of breath, cough or sputum.  GASTROINTESTINAL: No anorexia, nausea, vomiting or diarrhea. No abdominal pain or blood.  GENITOURINARY: No burning on urination, no polyuria NEUROLOGICAL: No headache, dizziness, syncope, paralysis, ataxia, numbness or tingling in the extremities. No change in bowel or bladder control.  MUSCULOSKELETAL: No muscle, back pain, joint pain or stiffness.  LYMPHATICS: No enlarged nodes. No history of splenectomy.  PSYCHIATRIC: No history of depression or anxiety.  ENDOCRINOLOGIC: No reports of sweating, cold or heat intolerance. No polyuria or polydipsia.  [name]   Physical Examination Today's Vitals   02/01/23  0809  BP: 102/70  Pulse: 76  SpO2: 97%  Weight: 271 lb 3.2 oz (123 kg)  Height: 6' (1.829 m)   Body mass index is 36.78 kg/m.  Gen: resting comfortably, no acute distress HEENT: no scleral icterus, pupils equal round and reactive, no palptable cervical adenopathy,  CV: RRR, no m/rg, no jvd Resp: Clear to auscultation bilaterally GI: abdomen is soft, non-tender, non-distended, normal bowel sounds, no hepatosplenomegaly MSK: extremities are warm, no edema.  Skin: warm, no rash Neuro:  no focal deficits Psych: appropriate affect   Diagnostic Studies  NST: 03/2019 No diagnostic ST segment changes to indicate ischemia. Small, mild intensity, inferior defect that is partially reversible at the apex in the setting of increased radiotracer uptake on rest imaging near the inferior  wall. Otherwise defect is fixed and suggestive of soft tissue attenuation. Cannot exclude a mild inferior apical ischemic territory, although with reduced specificity. This is a low risk study. Nuclear stress EF: 53%.   Event Monitor: 07/2020 14 day event monitor Rare supraventricular ectopy. Rare episodes of SVT up to 14 beats. Rare ventricular ectopy. One episode of NSVT lasting 6 beats. Symptoms reported but without sufficent data to correlate with heart rhythm at the time.     Patch Wear Time:  14 days and 0 hours (2022-02-22T18:05:23-0500 to 2022-03-08T18:05:27-0500)   Patient had a min HR of 64 bpm, max HR of 207 bpm, and avg HR of 92 bpm. Predominant underlying rhythm was Sinus Rhythm. 1 run of Ventricular Tachycardia occurred lasting 6 beats with a max rate of 160 bpm (avg 148 bpm). 7 Supraventricular Tachycardia  runs occurred, the run with the fastest interval lasting 14 beats with a max rate of 207 bpm (avg 165 bpm); the run with the fastest interval was also the longest. Isolated SVEs were rare (<1.0%), SVE Couplets were rare (<1.0%), and SVE Triplets were  rare (<1.0%). Isolated VEs were rare  (<1.0%), VE Couplets were rare (<1.0%), and no VE Triplets were present. Ventricular Trigeminy was present. Inverted QRS complexes possibly due to inverted placement of device.       Assessment and Plan   1.PSVT - has overall done well on lopressor, rare infrequent palpitations.  -continue current meds - If progressing recurrences f/u back up with EP to reconsider ablation   2. HTN - bp is at goal, actually required midodrine on HD days - continue current meds   3. Hyperlipidemia - LDL is at goal, continue current meds  F/u 6 months     Antoine Poche, M.D.

## 2023-02-01 NOTE — Patient Instructions (Signed)

## 2023-02-02 DIAGNOSIS — Z992 Dependence on renal dialysis: Secondary | ICD-10-CM | POA: Diagnosis not present

## 2023-02-02 DIAGNOSIS — N186 End stage renal disease: Secondary | ICD-10-CM | POA: Diagnosis not present

## 2023-02-03 ENCOUNTER — Encounter (HOSPITAL_COMMUNITY): Payer: 59

## 2023-02-03 ENCOUNTER — Encounter: Payer: 59 | Admitting: Vascular Surgery

## 2023-02-04 DIAGNOSIS — N186 End stage renal disease: Secondary | ICD-10-CM | POA: Diagnosis not present

## 2023-02-04 DIAGNOSIS — Z992 Dependence on renal dialysis: Secondary | ICD-10-CM | POA: Diagnosis not present

## 2023-02-07 DIAGNOSIS — Z992 Dependence on renal dialysis: Secondary | ICD-10-CM | POA: Diagnosis not present

## 2023-02-07 DIAGNOSIS — N186 End stage renal disease: Secondary | ICD-10-CM | POA: Diagnosis not present

## 2023-02-09 DIAGNOSIS — Z992 Dependence on renal dialysis: Secondary | ICD-10-CM | POA: Diagnosis not present

## 2023-02-09 DIAGNOSIS — N186 End stage renal disease: Secondary | ICD-10-CM | POA: Diagnosis not present

## 2023-02-11 DIAGNOSIS — N186 End stage renal disease: Secondary | ICD-10-CM | POA: Diagnosis not present

## 2023-02-11 DIAGNOSIS — Z992 Dependence on renal dialysis: Secondary | ICD-10-CM | POA: Diagnosis not present

## 2023-02-14 DIAGNOSIS — N186 End stage renal disease: Secondary | ICD-10-CM | POA: Diagnosis not present

## 2023-02-14 DIAGNOSIS — Z992 Dependence on renal dialysis: Secondary | ICD-10-CM | POA: Diagnosis not present

## 2023-02-16 ENCOUNTER — Ambulatory Visit (INDEPENDENT_AMBULATORY_CARE_PROVIDER_SITE_OTHER): Payer: 59 | Admitting: Physician Assistant

## 2023-02-16 ENCOUNTER — Encounter: Payer: Self-pay | Admitting: Physician Assistant

## 2023-02-16 VITALS — BP 107/72 | HR 84 | Temp 98.1°F | Wt 267.0 lb

## 2023-02-16 DIAGNOSIS — Z992 Dependence on renal dialysis: Secondary | ICD-10-CM | POA: Diagnosis not present

## 2023-02-16 DIAGNOSIS — T82590D Other mechanical complication of surgically created arteriovenous fistula, subsequent encounter: Secondary | ICD-10-CM

## 2023-02-16 DIAGNOSIS — N186 End stage renal disease: Secondary | ICD-10-CM

## 2023-02-16 NOTE — H&P (View-Only) (Signed)
POST OPERATIVE OFFICE NOTE    CC:  F/u for surgery  HPI:  Brian Barrera is a 52 y.o. male who is s/p revision of right radiocephalic AV fistula on 01/25/2023 by Dr. Randie Heinz.  Revision included the following: 1) open revision of AV fistula pseudoaneurysm with Fogarty thrombectomy and cleaner wire mechanical thrombectomy, and 2) balloon angioplasty with 5mm balloon.  This was done for a malfunctioning fistula with thrombosed pseudoaneurysms.  He returns today for follow-up and staple removal.  He states things have been fine since surgery.  He denies any issues with his incision such as drainage, redness, or pain.  Dialysis is still only able to use his fistula around the distal pseudoaneurysm.  They have not been able to try the fistula around the incision site.  They have tried to stick the fistula above the incision site several times, however it infiltrates.  He would like to discuss abandoning this fistula and creating new access.   No Known Allergies  Current Outpatient Medications  Medication Sig Dispense Refill   albuterol (ACCUNEB) 1.25 MG/3ML nebulizer solution Take 1 ampule by nebulization every 4 (four) hours as needed for wheezing or shortness of breath.     albuterol (PROAIR HFA) 108 (90 BASE) MCG/ACT inhaler Inhale 2 puffs into the lungs every 4 (four) hours as needed for wheezing. 3 Inhaler 0   aspirin EC 81 MG tablet Take 81 mg by mouth daily.     atorvastatin (LIPITOR) 10 MG tablet Take 10 mg by mouth daily.     AURYXIA 1 GM 210 MG(Fe) tablet Take 420 mg by mouth 3 (three) times daily with meals. 420 with snack     b complex-vitamin c-folic acid (NEPHRO-VITE) 0.8 MG TABS Take 1 tablet by mouth daily.     cinacalcet (SENSIPAR) 60 MG tablet Take 60 mg by mouth daily.     diphenhydramine-acetaminophen (TYLENOL PM) 25-500 MG TABS tablet Take 1 tablet by mouth at bedtime as needed (sleep).     DULoxetine (CYMBALTA) 60 MG capsule Take 60 mg by mouth daily.     metoprolol tartrate  (LOPRESSOR) 25 MG tablet Take 1.5 tablets (37.5 mg total) by mouth 2 (two) times daily. 270 tablet 3   midodrine (PROAMATINE) 5 MG tablet Take 5 mg by mouth every Monday, Wednesday, and Friday.     Omega-3 1000 MG CAPS Take 2,000 mg by mouth daily.     oxyCODONE-acetaminophen (PERCOCET) 10-325 MG tablet Take 1 tablet by mouth every 4 (four) hours as needed for pain. 30 tablet 0   pregabalin (LYRICA) 75 MG capsule Take 75 mg by mouth 2 (two) times daily.     sevelamer carbonate (RENVELA) 800 MG tablet Take 1,600-3,200 mg by mouth See admin instructions. Take 3200 mg with meals and 1600 with snack     SYMBICORT 160-4.5 MCG/ACT inhaler Inhale 2 puffs into the lungs 2 (two) times daily.     No current facility-administered medications for this visit.     ROS:  See HPI  Physical Exam:  Incision: Right forearm incision healing appropriately, all staples removed without issue Extremities: Right radiocephalic fistula with decent thrill on palpation.  1 pseudoaneurysm in distal portion of the arm Neuro: Intact motor and sensation of right upper extremity    Assessment/Plan:  This is a 52 y.o. male who is here for postop check  -He recently underwent revision of a right arm radiocephalic fistula for thrombosed pseudoaneurysms -His forearm incision is nearly healed without signs of infection or hematoma.  All staples were removed without issue -His radiocephalic fistula has a decent thrill on palpation.  Unfortunately dialysis still has not been able to use the fistula anywhere outside of the pseudo aneurysm area.  They have tried to stick the fistula above the incision site, however it always infiltrates. -At this time the patient is getting tired of this radiocephalic fistula malfunctioning and would like to discuss new access.  Dr. Randie Heinz was involved in this conversation.  We discussed with the patient creating a right upper arm AV fistula versus graft.  His best vein option is probably the basilic.  We will also have to place a [facility] during his next surgery.  We can plan for surgery on a Tuesday or Thursday in the next couple of weeks with Dr. Randie Heinz. -Until the patient's surgery, dialysis can try to stick the fistula around the incision site to see if this works   Loel Dubonnet, [place] [facility] [phone]   Clinic MD:  Randie Heinz

## 2023-02-16 NOTE — Progress Notes (Signed)
POST OPERATIVE OFFICE NOTE    CC:  F/u for surgery  HPI:  Brian Barrera is a 52 y.o. male who is s/p revision of right radiocephalic AV fistula on 01/25/2023 by Dr. Randie Heinz.  Revision included the following: 1) open revision of AV fistula pseudoaneurysm with Fogarty thrombectomy and cleaner wire mechanical thrombectomy, and 2) balloon angioplasty with 5mm balloon.  This was done for a malfunctioning fistula with thrombosed pseudoaneurysms.  He returns today for follow-up and staple removal.  He states things have been fine since surgery.  He denies any issues with his incision such as drainage, redness, or pain.  Dialysis is still only able to use his fistula around the distal pseudoaneurysm.  They have not been able to try the fistula around the incision site.  They have tried to stick the fistula above the incision site several times, however it infiltrates.  He would like to discuss abandoning this fistula and creating new access.   No Known Allergies  Current Outpatient Medications  Medication Sig Dispense Refill   albuterol (ACCUNEB) 1.25 MG/3ML nebulizer solution Take 1 ampule by nebulization every 4 (four) hours as needed for wheezing or shortness of breath.     albuterol (PROAIR HFA) 108 (90 BASE) MCG/ACT inhaler Inhale 2 puffs into the lungs every 4 (four) hours as needed for wheezing. 3 Inhaler 0   aspirin EC 81 MG tablet Take 81 mg by mouth daily.     atorvastatin (LIPITOR) 10 MG tablet Take 10 mg by mouth daily.     AURYXIA 1 GM 210 MG(Fe) tablet Take 420 mg by mouth 3 (three) times daily with meals. 420 with snack     b complex-vitamin c-folic acid (NEPHRO-VITE) 0.8 MG TABS Take 1 tablet by mouth daily.     cinacalcet (SENSIPAR) 60 MG tablet Take 60 mg by mouth daily.     diphenhydramine-acetaminophen (TYLENOL PM) 25-500 MG TABS tablet Take 1 tablet by mouth at bedtime as needed (sleep).     DULoxetine (CYMBALTA) 60 MG capsule Take 60 mg by mouth daily.     metoprolol tartrate  (LOPRESSOR) 25 MG tablet Take 1.5 tablets (37.5 mg total) by mouth 2 (two) times daily. 270 tablet 3   midodrine (PROAMATINE) 5 MG tablet Take 5 mg by mouth every Monday, Wednesday, and Friday.     Omega-3 1000 MG CAPS Take 2,000 mg by mouth daily.     oxyCODONE-acetaminophen (PERCOCET) 10-325 MG tablet Take 1 tablet by mouth every 4 (four) hours as needed for pain. 30 tablet 0   pregabalin (LYRICA) 75 MG capsule Take 75 mg by mouth 2 (two) times daily.     sevelamer carbonate (RENVELA) 800 MG tablet Take 1,600-3,200 mg by mouth See admin instructions. Take 3200 mg with meals and 1600 with snack     SYMBICORT 160-4.5 MCG/ACT inhaler Inhale 2 puffs into the lungs 2 (two) times daily.     No current facility-administered medications for this visit.     ROS:  See HPI  Physical Exam:  Incision: Right forearm incision healing appropriately, all staples removed without issue Extremities: Right radiocephalic fistula with decent thrill on palpation.  1 pseudoaneurysm in distal portion of the arm Neuro: Intact motor and sensation of right upper extremity    Assessment/Plan:  This is a 52 y.o. male who is here for postop check  -He recently underwent revision of a right arm radiocephalic fistula for thrombosed pseudoaneurysms -His forearm incision is nearly healed without signs of infection or hematoma.  All staples were removed without issue -His radiocephalic fistula has a decent thrill on palpation.  Unfortunately dialysis still has not been able to use the fistula anywhere outside of the pseudo aneurysm area.  They have tried to stick the fistula above the incision site, however it always infiltrates. -At this time the patient is getting tired of this radiocephalic fistula malfunctioning and would like to discuss new access.  Dr. Randie Heinz was involved in this conversation.  We discussed with the patient creating a right upper arm AV fistula versus graft.  His best vein option is probably the basilic.  We will also have to place a [facility] during his next surgery.  We can plan for surgery on a Tuesday or Thursday in the next couple of weeks with Dr. Randie Heinz. -Until the patient's surgery, dialysis can try to stick the fistula around the incision site to see if this works   Loel Dubonnet, [facility] [phone]   Clinic MD:  Randie Heinz

## 2023-02-18 DIAGNOSIS — N186 End stage renal disease: Secondary | ICD-10-CM | POA: Diagnosis not present

## 2023-02-18 DIAGNOSIS — Z992 Dependence on renal dialysis: Secondary | ICD-10-CM | POA: Diagnosis not present

## 2023-02-21 ENCOUNTER — Telehealth: Payer: Self-pay

## 2023-02-21 DIAGNOSIS — Z992 Dependence on renal dialysis: Secondary | ICD-10-CM | POA: Diagnosis not present

## 2023-02-21 DIAGNOSIS — N186 End stage renal disease: Secondary | ICD-10-CM | POA: Diagnosis not present

## 2023-02-21 DIAGNOSIS — E782 Mixed hyperlipidemia: Secondary | ICD-10-CM | POA: Diagnosis not present

## 2023-02-21 DIAGNOSIS — I10 Essential (primary) hypertension: Secondary | ICD-10-CM | POA: Diagnosis not present

## 2023-02-21 NOTE — Telephone Encounter (Signed)
Called pt to schedule his surgery. Left VM asking that he return our call.

## 2023-02-22 DIAGNOSIS — Z79891 Long term (current) use of opiate analgesic: Secondary | ICD-10-CM | POA: Diagnosis not present

## 2023-02-22 DIAGNOSIS — M25569 Pain in unspecified knee: Secondary | ICD-10-CM | POA: Diagnosis not present

## 2023-02-22 DIAGNOSIS — M25511 Pain in right shoulder: Secondary | ICD-10-CM | POA: Diagnosis not present

## 2023-02-22 DIAGNOSIS — M542 Cervicalgia: Secondary | ICD-10-CM | POA: Diagnosis not present

## 2023-02-22 DIAGNOSIS — Z79899 Other long term (current) drug therapy: Secondary | ICD-10-CM | POA: Diagnosis not present

## 2023-02-22 DIAGNOSIS — G894 Chronic pain syndrome: Secondary | ICD-10-CM | POA: Diagnosis not present

## 2023-02-22 DIAGNOSIS — M549 Dorsalgia, unspecified: Secondary | ICD-10-CM | POA: Diagnosis not present

## 2023-02-22 DIAGNOSIS — M25512 Pain in left shoulder: Secondary | ICD-10-CM | POA: Diagnosis not present

## 2023-02-23 DIAGNOSIS — Z992 Dependence on renal dialysis: Secondary | ICD-10-CM | POA: Diagnosis not present

## 2023-02-23 DIAGNOSIS — Z23 Encounter for immunization: Secondary | ICD-10-CM | POA: Diagnosis not present

## 2023-02-23 DIAGNOSIS — N186 End stage renal disease: Secondary | ICD-10-CM | POA: Diagnosis not present

## 2023-02-24 ENCOUNTER — Other Ambulatory Visit: Payer: Self-pay

## 2023-02-24 DIAGNOSIS — N186 End stage renal disease: Secondary | ICD-10-CM

## 2023-02-25 DIAGNOSIS — Z23 Encounter for immunization: Secondary | ICD-10-CM | POA: Diagnosis not present

## 2023-02-25 DIAGNOSIS — Z992 Dependence on renal dialysis: Secondary | ICD-10-CM | POA: Diagnosis not present

## 2023-02-25 DIAGNOSIS — N186 End stage renal disease: Secondary | ICD-10-CM | POA: Diagnosis not present

## 2023-02-25 NOTE — Telephone Encounter (Signed)
Patient contacted office requesting to move up his surgery. Patient rescheduled to earliest available date of 10/8. Instructions reviewed and he verbalized understanding.

## 2023-02-28 ENCOUNTER — Encounter (HOSPITAL_COMMUNITY): Payer: Self-pay | Admitting: Vascular Surgery

## 2023-02-28 ENCOUNTER — Other Ambulatory Visit: Payer: Self-pay

## 2023-02-28 DIAGNOSIS — N186 End stage renal disease: Secondary | ICD-10-CM | POA: Diagnosis not present

## 2023-02-28 DIAGNOSIS — Z23 Encounter for immunization: Secondary | ICD-10-CM | POA: Diagnosis not present

## 2023-02-28 DIAGNOSIS — Z992 Dependence on renal dialysis: Secondary | ICD-10-CM | POA: Diagnosis not present

## 2023-02-28 NOTE — Progress Notes (Signed)
Mr. Madero denies chest pain or shortness of breath. Patient denies having any s/s of Covid in his household, also denies any known exposure to Covid. Mr. Trefz denies  any s/s of upper or lower respiratory infection in the past 8 weeks.   Mr. Null PCP is Dr. Lynelle Smoke, cardiologist is Dr. Dominga Ferry, EP Dr. Is Dr Lewayne Bunting.  Mr. Hupp denies palpations.

## 2023-03-01 ENCOUNTER — Ambulatory Visit (HOSPITAL_COMMUNITY)
Admission: RE | Admit: 2023-03-01 | Discharge: 2023-03-01 | Disposition: A | Discharge status: Home / Self Care | Payer: 59 | Attending: Vascular Surgery | Admitting: Vascular Surgery

## 2023-03-01 ENCOUNTER — Ambulatory Visit (HOSPITAL_COMMUNITY): Payer: 59 | Admitting: Anesthesiology

## 2023-03-01 ENCOUNTER — Ambulatory Visit (HOSPITAL_COMMUNITY): Payer: 59

## 2023-03-01 ENCOUNTER — Encounter (HOSPITAL_COMMUNITY)
Admission: RE | Disposition: A | Discharge status: Home / Self Care | Payer: Self-pay | Source: Home / Self Care | Attending: Vascular Surgery

## 2023-03-01 ENCOUNTER — Encounter (HOSPITAL_COMMUNITY): Payer: Self-pay | Admitting: Vascular Surgery

## 2023-03-01 ENCOUNTER — Ambulatory Visit (HOSPITAL_BASED_OUTPATIENT_CLINIC_OR_DEPARTMENT_OTHER): Payer: 59 | Admitting: Anesthesiology

## 2023-03-01 ENCOUNTER — Other Ambulatory Visit: Payer: Self-pay

## 2023-03-01 DIAGNOSIS — I132 Hypertensive heart and chronic kidney disease with heart failure and with stage 5 chronic kidney disease, or end stage renal disease: Secondary | ICD-10-CM

## 2023-03-01 DIAGNOSIS — G8929 Other chronic pain: Secondary | ICD-10-CM | POA: Diagnosis not present

## 2023-03-01 DIAGNOSIS — Z6835 Body mass index (BMI) 35.0-35.9, adult: Secondary | ICD-10-CM | POA: Insufficient documentation

## 2023-03-01 DIAGNOSIS — T82590A Other mechanical complication of surgically created arteriovenous fistula, initial encounter: Secondary | ICD-10-CM

## 2023-03-01 DIAGNOSIS — T82898A Other specified complication of vascular prosthetic devices, implants and grafts, initial encounter: Secondary | ICD-10-CM | POA: Diagnosis not present

## 2023-03-01 DIAGNOSIS — Z79891 Long term (current) use of opiate analgesic: Secondary | ICD-10-CM | POA: Insufficient documentation

## 2023-03-01 DIAGNOSIS — Y832 Surgical operation with anastomosis, bypass or graft as the cause of abnormal reaction of the patient, or of later complication, without mention of misadventure at the time of the procedure: Secondary | ICD-10-CM | POA: Diagnosis not present

## 2023-03-01 DIAGNOSIS — N186 End stage renal disease: Secondary | ICD-10-CM | POA: Diagnosis not present

## 2023-03-01 DIAGNOSIS — Z452 Encounter for adjustment and management of vascular access device: Secondary | ICD-10-CM | POA: Diagnosis not present

## 2023-03-01 DIAGNOSIS — Z992 Dependence on renal dialysis: Secondary | ICD-10-CM

## 2023-03-01 DIAGNOSIS — M1990 Unspecified osteoarthritis, unspecified site: Secondary | ICD-10-CM | POA: Diagnosis not present

## 2023-03-01 DIAGNOSIS — I509 Heart failure, unspecified: Secondary | ICD-10-CM

## 2023-03-01 DIAGNOSIS — J4489 Other specified chronic obstructive pulmonary disease: Secondary | ICD-10-CM | POA: Insufficient documentation

## 2023-03-01 DIAGNOSIS — F17210 Nicotine dependence, cigarettes, uncomplicated: Secondary | ICD-10-CM | POA: Insufficient documentation

## 2023-03-01 DIAGNOSIS — E669 Obesity, unspecified: Secondary | ICD-10-CM | POA: Diagnosis not present

## 2023-03-01 HISTORY — PX: INSERTION OF DIALYSIS CATHETER: SHX1324

## 2023-03-01 HISTORY — DX: Unspecified asthma, uncomplicated: J45.909

## 2023-03-01 HISTORY — PX: REVISON OF ARTERIOVENOUS FISTULA: SHX6074

## 2023-03-01 LAB — POCT I-STAT, CHEM 8
BUN: 55 mg/dL — ABNORMAL HIGH (ref 6–20)
Calcium, Ion: 1.18 mmol/L (ref 1.15–1.40)
Chloride: 103 mmol/L (ref 98–111)
Creatinine, Ser: 17.1 mg/dL — ABNORMAL HIGH (ref 0.61–1.24)
Glucose, Bld: 98 mg/dL (ref 70–99)
HCT: 50 % (ref 39.0–52.0)
Hemoglobin: 17 g/dL (ref 13.0–17.0)
Potassium: 4.8 mmol/L (ref 3.5–5.1)
Sodium: 139 mmol/L (ref 135–145)
TCO2: 25 mmol/L (ref 22–32)

## 2023-03-01 SURGERY — REVISON OF ARTERIOVENOUS FISTULA
Anesthesia: General | Site: Neck | Laterality: Right

## 2023-03-01 MED ORDER — CHLORHEXIDINE GLUCONATE 4 % EX SOLN: 60.0000 mL | Freq: Once | CUTANEOUS | Status: DC

## 2023-03-01 MED ORDER — PROPOFOL 10 MG/ML IV BOLUS
INTRAVENOUS | Status: AC
  Filled 2023-03-01: qty 20

## 2023-03-01 MED ORDER — PHENYLEPHRINE 80 MCG/ML (10ML) SYRINGE FOR IV PUSH (FOR BLOOD PRESSURE SUPPORT)
PREFILLED_SYRINGE | INTRAVENOUS | Status: DC | PRN
Start: 2023-03-01 — End: 2023-03-01
  Administered 2023-03-01: 80 ug via INTRAVENOUS

## 2023-03-01 MED ORDER — SODIUM CHLORIDE 0.9 % IV SOLN: INTRAVENOUS | Status: DC

## 2023-03-01 MED ORDER — HYDROMORPHONE HCL 1 MG/ML IJ SOLN: 0.2500 mg | INTRAMUSCULAR | Status: DC | PRN

## 2023-03-01 MED ORDER — HEPARIN SODIUM (PORCINE) 1000 UNIT/ML IJ SOLN
INTRAMUSCULAR | Status: AC
  Filled 2023-03-01: qty 10

## 2023-03-01 MED ORDER — HEPARIN 6000 UNIT IRRIGATION SOLUTION
Status: AC
  Filled 2023-03-01: qty 500

## 2023-03-01 MED ORDER — PHENYLEPHRINE HCL-NACL 20-0.9 MG/250ML-% IV SOLN
INTRAVENOUS | Status: DC | PRN
Start: 2023-03-01 — End: 2023-03-01
  Administered 2023-03-01: 25 ug/min via INTRAVENOUS

## 2023-03-01 MED ORDER — CEFAZOLIN SODIUM-DEXTROSE 2-4 GM/100ML-% IV SOLN
2.0000 g | INTRAVENOUS | Status: AC
  Administered 2023-03-01: 2 g via INTRAVENOUS
  Filled 2023-03-01: qty 100

## 2023-03-01 MED ORDER — FENTANYL CITRATE (PF) 250 MCG/5ML IJ SOLN
INTRAMUSCULAR | Status: AC
  Filled 2023-03-01: qty 5

## 2023-03-01 MED ORDER — PROPOFOL 10 MG/ML IV BOLUS
INTRAVENOUS | Status: DC | PRN
  Administered 2023-03-01: 30 mg via INTRAVENOUS
  Administered 2023-03-01: 200 mg via INTRAVENOUS

## 2023-03-01 MED ORDER — 0.9 % SODIUM CHLORIDE (POUR BTL) OPTIME
TOPICAL | Status: DC | PRN
  Administered 2023-03-01: 1000 mL

## 2023-03-01 MED ORDER — MIDAZOLAM HCL 2 MG/2ML IJ SOLN
INTRAMUSCULAR | Status: AC
  Filled 2023-03-01: qty 2

## 2023-03-01 MED ORDER — DEXAMETHASONE SODIUM PHOSPHATE 10 MG/ML IJ SOLN
INTRAMUSCULAR | Status: DC | PRN
  Administered 2023-03-01: 4 mg via INTRAVENOUS

## 2023-03-01 MED ORDER — ONDANSETRON HCL 4 MG/2ML IJ SOLN
INTRAMUSCULAR | Status: DC | PRN
  Administered 2023-03-01: 4 mg via INTRAVENOUS

## 2023-03-01 MED ORDER — OXYCODONE HCL 5 MG/5ML PO SOLN: 5.0000 mg | Freq: Once | ORAL | Status: DC | PRN

## 2023-03-01 MED ORDER — ONDANSETRON HCL 4 MG/2ML IJ SOLN: 4.0000 mg | Freq: Once | INTRAMUSCULAR | Status: DC | PRN

## 2023-03-01 MED ORDER — LIDOCAINE HCL (PF) 1 % IJ SOLN
INTRAMUSCULAR | Status: AC
  Filled 2023-03-01: qty 30

## 2023-03-01 MED ORDER — FENTANYL CITRATE (PF) 250 MCG/5ML IJ SOLN
INTRAMUSCULAR | Status: DC | PRN
  Administered 2023-03-01 (×5): 25 ug via INTRAVENOUS

## 2023-03-01 MED ORDER — MIDAZOLAM HCL 5 MG/5ML IJ SOLN
INTRAMUSCULAR | Status: DC | PRN
  Administered 2023-03-01: 2 mg via INTRAVENOUS

## 2023-03-01 MED ORDER — ORAL CARE MOUTH RINSE: 15.0000 mL | Freq: Once | OROMUCOSAL | Status: AC

## 2023-03-01 MED ORDER — OXYCODONE HCL 5 MG PO TABS
10.0000 mg | ORAL_TABLET | Freq: Once | ORAL | Status: AC
  Administered 2023-03-01: 10 mg via ORAL
  Filled 2023-03-01: qty 2

## 2023-03-01 MED ORDER — ACETAMINOPHEN 500 MG PO TABS
1000.0000 mg | ORAL_TABLET | Freq: Once | ORAL | Status: AC
  Administered 2023-03-01: 1000 mg via ORAL
  Filled 2023-03-01: qty 2

## 2023-03-01 MED ORDER — HEPARIN SODIUM (PORCINE) 1000 UNIT/ML IJ SOLN
INTRAMUSCULAR | Status: DC | PRN
  Administered 2023-03-01: 1000 [IU] via INTRAVENOUS

## 2023-03-01 MED ORDER — HEPARIN 6000 UNIT IRRIGATION SOLUTION
Status: DC | PRN
  Administered 2023-03-01: 1

## 2023-03-01 MED ORDER — LIDOCAINE 2% (20 MG/ML) 5 ML SYRINGE
INTRAMUSCULAR | Status: DC | PRN
  Administered 2023-03-01: 100 mg via INTRAVENOUS

## 2023-03-01 MED ORDER — CHLORHEXIDINE GLUCONATE 0.12 % MT SOLN
15.0000 mL | Freq: Once | OROMUCOSAL | Status: AC
  Administered 2023-03-01: 15 mL via OROMUCOSAL
  Filled 2023-03-01: qty 15

## 2023-03-01 MED ORDER — OXYCODONE HCL 5 MG PO TABS: 5.0000 mg | ORAL_TABLET | Freq: Once | ORAL | Status: DC | PRN

## 2023-03-01 SURGICAL SUPPLY — 61 items
ADH SKN CLS APL DERMABOND .7 (GAUZE/BANDAGES/DRESSINGS) ×4
ARMBAND PINK RESTRICT EXTREMIT (MISCELLANEOUS) ×2 IMPLANT
BAG COUNTER SPONGE SURGICOUNT (BAG) ×2 IMPLANT
BAG DECANTER FOR FLEXI CONT (MISCELLANEOUS) ×2 IMPLANT
BAG SPNG CNTER NS LX DISP (BAG) ×2
BIOPATCH RED 1 DISK 7.0 (GAUZE/BANDAGES/DRESSINGS) ×2 IMPLANT
CANISTER SUCT 3000ML PPV (MISCELLANEOUS) ×2 IMPLANT
CATH PALINDROME-P 19CM W/VT (CATHETERS) IMPLANT
CATH PALINDROME-P 23CM W/VT (CATHETERS) IMPLANT
CATH PALINDROME-P 28CM W/VT (CATHETERS) IMPLANT
CLIP LIGATING EXTRA MED SLVR (CLIP) ×2 IMPLANT
CLIP LIGATING EXTRA SM BLUE (MISCELLANEOUS) ×2 IMPLANT
COVER PROBE W GEL 5X96 (DRAPES) ×2 IMPLANT
COVER SURGICAL LIGHT HANDLE (MISCELLANEOUS) ×2 IMPLANT
DERMABOND ADVANCED .7 DNX12 (GAUZE/BANDAGES/DRESSINGS) ×2 IMPLANT
DRAPE C-ARM 42X72 X-RAY (DRAPES) ×2 IMPLANT
DRAPE CHEST BREAST 15X10 FENES (DRAPES) ×2 IMPLANT
DRAPE ORTHO SPLIT 77X108 STRL (DRAPES) ×2
DRAPE SURG ORHT 6 SPLT 77X108 (DRAPES) IMPLANT
DRSG COVADERM 4X6 (GAUZE/BANDAGES/DRESSINGS) IMPLANT
ELECT REM PT RETURN 9FT ADLT (ELECTROSURGICAL) ×2
ELECTRODE REM PT RTRN 9FT ADLT (ELECTROSURGICAL) ×2 IMPLANT
GAUZE 4X4 16PLY ~~LOC~~+RFID DBL (SPONGE) ×2 IMPLANT
GLOVE BIO SURGEON STRL SZ7.5 (GLOVE) ×2 IMPLANT
GOWN STRL REUS W/ TWL LRG LVL3 (GOWN DISPOSABLE) ×4 IMPLANT
GOWN STRL REUS W/ TWL XL LVL3 (GOWN DISPOSABLE) ×2 IMPLANT
GOWN STRL REUS W/TWL LRG LVL3 (GOWN DISPOSABLE) ×4
GOWN STRL REUS W/TWL XL LVL3 (GOWN DISPOSABLE) ×2
GRAFT COLLAGEN VASCULAR 7X40 (Vascular Products) IMPLANT
KIT BASIN OR (CUSTOM PROCEDURE TRAY) ×2 IMPLANT
KIT PALINDROME-P 55CM (CATHETERS) IMPLANT
KIT TURNOVER KIT B (KITS) ×2 IMPLANT
NDL 18GX1X1/2 (RX/OR ONLY) (NEEDLE) ×2 IMPLANT
NDL HYPO 25GX1X1/2 BEV (NEEDLE) ×2 IMPLANT
NEEDLE 18GX1X1/2 (RX/OR ONLY) (NEEDLE) ×2 IMPLANT
NEEDLE HYPO 25GX1X1/2 BEV (NEEDLE) ×2 IMPLANT
NS IRRIG 1000ML POUR BTL (IV SOLUTION) ×2 IMPLANT
PACK BASIC III (CUSTOM PROCEDURE TRAY)
PACK CV ACCESS (CUSTOM PROCEDURE TRAY) ×2 IMPLANT
PACK SRG BSC III STRL LF ECLPS (CUSTOM PROCEDURE TRAY) ×2 IMPLANT
PAD ARMBOARD 7.5X6 YLW CONV (MISCELLANEOUS) ×4 IMPLANT
POWDER SURGICEL 3.0 GRAM (HEMOSTASIS) IMPLANT
SET MICROPUNCTURE 5F STIFF (MISCELLANEOUS) IMPLANT
SLING ARM FOAM STRAP LRG (SOFTGOODS) IMPLANT
SLING ARM FOAM STRAP MED (SOFTGOODS) IMPLANT
SOAP 2 % CHG 4 OZ (WOUND CARE) ×2 IMPLANT
SUT ETHILON 3 0 PS 1 (SUTURE) ×2 IMPLANT
SUT MNCRL AB 4-0 PS2 18 (SUTURE) ×2 IMPLANT
SUT PROLENE 5 0 C 1 24 (SUTURE) IMPLANT
SUT PROLENE 6 0 BV (SUTURE) ×2 IMPLANT
SUT VIC AB 3-0 SH 27 (SUTURE) ×4
SUT VIC AB 3-0 SH 27X BRD (SUTURE) ×2 IMPLANT
SYR 10ML LL (SYRINGE) ×2 IMPLANT
SYR 20ML LL LF (SYRINGE) ×4 IMPLANT
SYR 5ML LL (SYRINGE) ×2 IMPLANT
SYR CONTROL 10ML LL (SYRINGE) ×2 IMPLANT
TOWEL GREEN STERILE (TOWEL DISPOSABLE) ×2 IMPLANT
TOWEL GREEN STERILE FF (TOWEL DISPOSABLE) ×4 IMPLANT
UNDERPAD 30X36 HEAVY ABSORB (UNDERPADS AND DIAPERS) ×2 IMPLANT
WATER STERILE IRR 1000ML POUR (IV SOLUTION) ×2 IMPLANT
WIRE TORQFLEX AUST .018X40CM (WIRE) IMPLANT

## 2023-03-01 NOTE — Discharge Instructions (Addendum)
[facility]  Discharge Instructions  AV Fistula or Graft Surgery for Dialysis Access  Please refer to the following instructions for your post-procedure care. Your surgeon or physician assistant will discuss any changes with you.  Activity  You may drive the day following your surgery, if you are comfortable and no longer taking prescription pain medication. Resume full activity as the soreness in your incision resolves.  Bathing/Showering  You may shower after you go home. Keep your incision dry for 48 hours. Do not soak in a bathtub, hot tub, or swim until the incision heals completely. You may not shower if you have a hemodialysis catheter.  Incision Care  Clean your incision with mild soap and water after 48 hours. Pat the area dry with a clean towel. You do not need a bandage unless otherwise instructed. Do not apply any ointments or creams to your incision. You may have skin glue on your incision. Do not peel it off. It will come off on its own in about one week. Your arm may swell a bit after surgery. To reduce swelling use pillows to elevate your arm so it is above your heart. Your doctor will tell you if you need to lightly wrap your arm with an ACE bandage.  Diet  Resume your normal diet. There are not special food restrictions following this procedure. In order to heal from your surgery, it is CRITICAL to get adequate nutrition. Your body requires vitamins, minerals, and protein. Vegetables are the best source of vitamins and minerals. Vegetables also provide the perfect balance of protein. Processed food has little nutritional value, so try to avoid this.  Medications  Resume taking all of your medications. If your incision is causing pain, you may take over-the counter pain relievers such as acetaminophen (Tylenol). If you were prescribed a stronger pain medication, please be aware these medications can cause nausea and constipation. Prevent  nausea by taking the medication with a snack or meal. Avoid constipation by drinking plenty of fluids and eating foods with high amount of fiber, such as fruits, vegetables, and grains.  Do not take Tylenol if you are taking prescription pain medications.  Follow up Your surgeon may want to see you in the office following your access surgery. If so, this will be arranged at the time of your surgery.  Please call us immediately for any of the following conditions:  Increased pain, redness, drainage (pus) from your incision site Fever of 101 degrees or higher Severe or worsening pain at your incision site Hand pain or numbness.  Reduce your risk of vascular disease:  Stop smoking. If you would like help, call [facility] at 1-800-QUIT-NOW (912-809-0836) or [place] at 505-449-4597  Manage your cholesterol Maintain a desired weight Control your diabetes Keep your blood pressure down  Dialysis  It will take several weeks to several months for your new dialysis access to be ready for use. Your surgeon will determine when it is okay to use it. Your nephrologist will continue to direct your dialysis. You can continue to use your Permcath until your new access is ready for use.   03/01/2023 HESTER SUNSHINE [id number] Apr 28, 1971  Surgeon(s): Maeola Harman, MD  Procedure(s): INSERTION OF ARTERIOVENOUS GRAFT USING ARTEGRAFT  X 42CM INSERTION OF RIGHT INTERNAL JUGULAR TUNNELED DIALYSIS CATHETER  x Do not stick graft for 5 weeks    If you have any questions, please call the office at [phone].

## 2023-03-01 NOTE — Transfer of Care (Signed)
Immediate Anesthesia Transfer of Care Note  Patient: Brian Barrera  Procedure(s) Performed: INSERTION OF ARTERIOVENOUS GRAFT USING ARTEGRAFT  X 42CM (Right: Arm Upper) INSERTION OF RIGHT INTERNAL JUGULAR TUNNELED DIALYSIS CATHETER (Right: Neck)  Patient Location: PACU  Anesthesia Type:General  Level of Consciousness: drowsy  Airway & Oxygen Therapy: Patient Spontanous Breathing and Patient connected to nasal cannula oxygen  Post-op Assessment: Report given to RN and Post -op Vital signs reviewed and stable  Post vital signs: Reviewed and stable  Last Vitals:  Vitals Value Taken Time  BP 109/68 03/01/23 1433  Temp    Pulse 64 03/01/23 1434  Resp 11 03/01/23 1434  SpO2 95 % 03/01/23 1434  Vitals shown include unfiled device data.  Last Pain:  Vitals:   03/01/23 0839  TempSrc:   PainSc: 7       Patients Stated Pain Goal: 4 (03/01/23 0839)  Complications: No notable events documented.

## 2023-03-01 NOTE — Anesthesia Postprocedure Evaluation (Signed)
Anesthesia Post Note  Patient: Brian Barrera  Procedure(s) Performed: INSERTION OF ARTERIOVENOUS GRAFT USING ARTEGRAFT  X 42CM (Right: Arm Upper) INSERTION OF RIGHT INTERNAL JUGULAR TUNNELED DIALYSIS CATHETER (Right: Neck)     Patient location during evaluation: PACU Anesthesia Type: General Level of consciousness: awake Pain management: pain level controlled Vital Signs Assessment: post-procedure vital signs reviewed and stable Respiratory status: spontaneous breathing, nonlabored ventilation and respiratory function stable Cardiovascular status: blood pressure returned to baseline and stable Postop Assessment: no apparent nausea or vomiting Anesthetic complications: no  No notable events documented.  Last Vitals:  Vitals:   03/01/23 1530 03/01/23 1545  BP: 109/67 102/70  Pulse: 66 65  Resp: 10 12  Temp:  36.4 C  SpO2: 92% 94%    Last Pain:  Vitals:   03/01/23 1545  TempSrc:   PainSc: 0-No pain                 Linton Rump

## 2023-03-01 NOTE — Interval H&P Note (Signed)
History and Physical Interval Note:  03/01/2023 11:25 AM  Brian Barrera  has presented today for surgery, with the diagnosis of Malfunction of arteriovenous dialysis fistula.  The various methods of treatment have been discussed with the patient and family. After consideration of risks, benefits and other options for treatment, the patient has consented to  Procedure(s): CONVERSION TO RIGHT UPPER ARM FISTULA VERSUS ARTERIOVENOUS GRAFT (Right) INSERTION OF TUNNELED DIALYSIS CATHETER (N/A) as a surgical intervention.  The patient's history has been reviewed, patient examined, no change in status, stable for surgery.  I have reviewed the patient's chart and labs.  Questions were answered to the patient's satisfaction.     Lemar Livings

## 2023-03-01 NOTE — Anesthesia Preprocedure Evaluation (Addendum)
Anesthesia Evaluation  Patient identified by MRN, date of birth, ID band Patient awake    Reviewed: Allergy & Precautions, H&P , NPO status , Patient's Chart, lab work & pertinent test results, reviewed documented beta blocker date and time   History of Anesthesia Complications Negative for: history of anesthetic complications  Airway Mallampati: III  TM Distance: >3 FB Neck ROM: Full    Dental no notable dental hx.    Pulmonary neg shortness of breath, asthma , neg sleep apnea, COPD,  COPD inhaler, neg recent URI, Current Smoker Current smoker, 25 pack year history    Pulmonary exam normal breath sounds clear to auscultation       Cardiovascular hypertension, Pt. on medications and Pt. on home beta blockers (-) angina +CHF  (-) Past MI, (-) Cardiac Stents and (-) CABG Normal cardiovascular exam Rhythm:Regular Rate:Normal  Echo 2023 1. Mild global hypokinesis worse in inferior base . Left ventricular  ejection fraction, by estimation, is 50 to 55%. The left ventricle has low  normal function. The left ventricle demonstrates global hypokinesis. The  left ventricular internal cavity  size was mildly dilated. There is mild left ventricular hypertrophy. Left  ventricular diastolic parameters were normal.   2. Right ventricular systolic function is normal. The right ventricular  size is normal.   3. Left atrial size was moderately dilated.   4. The mitral valve is normal in structure. Trivial mitral valve  regurgitation. No evidence of mitral stenosis.   5. The aortic valve is tricuspid. Aortic valve regurgitation is not  visualized. No aortic stenosis is present.   6. The inferior vena cava is normal in size with greater than 50%  respiratory variability, suggesting right atrial pressure of 3 mmHg.     Neuro/Psych negative neurological ROS  negative psych ROS   GI/Hepatic negative GI ROS, Neg liver ROS,,,  Endo/Other  neg  diabetes  Obesity BMI 35  Renal/GU ESRF and DialysisRenal diseaseK 4.8  negative genitourinary   Musculoskeletal  (+) Arthritis , Osteoarthritis,  Chronic pain- takes oxy 10mg  for leg pain but ran out a few days ago. Requesting oxy in preop   Abdominal  (+) + obese  Peds negative pediatric ROS (+)  Hematology  (+) Blood dyscrasia, anemia Lab Results      Component                Value               Date                      WBC                      14.4 (H)            07/05/2022                HGB                      17.0                03/01/2023                HCT                      50.0                03/01/2023  MCV                      97.0                07/05/2022                PLT                      245                 07/05/2022              Anesthesia Other Findings   Reproductive/Obstetrics negative OB ROS                             Anesthesia Physical Anesthesia Plan  ASA: 3  Anesthesia Plan: General   Post-op Pain Management:    Induction: Intravenous  PONV Risk Score and Plan: 1 and Ondansetron, Dexamethasone, Midazolam and Treatment may vary due to age or medical condition  Airway Management Planned: Oral ETT and LMA  Additional Equipment: None  Intra-op Plan:   Post-operative Plan: Extubation in OR  Informed Consent: I have reviewed the patients History and Physical, chart, labs and discussed the procedure including the risks, benefits and alternatives for the proposed anesthesia with the patient or authorized representative who has indicated his/her understanding and acceptance.     Dental advisory given  Plan Discussed with: CRNA and Anesthesiologist  Anesthesia Plan Comments: (Risks of general anesthesia discussed including, but not limited to, sore throat, hoarse voice, chipped/damaged teeth, injury to vocal cords, nausea and vomiting, allergic reactions, lung infection, heart attack, stroke, and  death. All questions answered. )        Anesthesia Quick Evaluation

## 2023-03-01 NOTE — Op Note (Signed)
Patient name: Brian Barrera MRN: [id number] DOB: 09/30/1970 Sex: male  03/01/2023 Pre-operative Diagnosis: ESRD, malfunction right arm av fistula Post-operative diagnosis:  Same Surgeon:  Luanna Salk. Randie Heinz, MD Assistant: Doreatha Massed, PA Procedure Performed: 1.  Placement of right IJ 19 cm tunneled dialysis catheter with ultrasound fluoroscopic guidance 2.  Revision of right arm radiocephalic AV fistula with interposition 6 m Artegraft from proximal fistula to basilic vein at the antecubital  Indications: 52 year old male with end-stage renal disease currently dialyzing via right arm radial artery to cephalic vein AV fistula.  This was recently revised due to see new aneurysmal degeneration and now he has significant difficulties with cannulation of a second pseudoaneurysm and there is thrombosed outflow.  He is now indicated for revision with possible conversion to forearm AV graft versus conversion to upper arm basilic vein fistula.  He will also require tunneled dialysis catheter.  An experienced assistant was necessary to facilitate exposure of the fistula proximally as well as the basilic vein at the antecubital and then perform anastomosis between the 2 and also ligation of side branches of existing pseudoaneurysm.  Findings: The right IJ was patent there was some evidence of intravenous chronic thrombus.  Tunneled dialysis catheter was placed in the SVC atrial junction.  In the forearm there was a large inflow vein which was used as the inflow to the graft and a large basilic vein at the antecubital which was used as the outflow.  The large pseudoaneurysm was drained of thrombus and blood and flushed out and there was a large sidebranch which was ligated to prevent refilling of the pseudoaneurysm.   Procedure:  The patient was identified in the holding area and taken to the operating room where he was placed supine operative table and LMA anesthesia was induced.  He was sterilely  prepped and draped in the bilateral neck and right upper extremity usual fashion, antibiotics were administered a timeout was called.  We began using ultrasound identified and large and compressible right IJ although there was some chronic appearing thrombus.  This was cannulated with a micropuncture needle followed by wire and a sheath.  A J-wire was placed centrally.  19 cm catheter was tunneled from the counterincision.  The wire tract was serially dilated and the introducer sheath was placed.  The catheter was then placed at the [facility] atrial junction.  This was confirmed with fluoroscopy.  The catheter was then affixed to the skin with 3-0 nylon suture and the neck incision closed with 4-0 Monocryl and Dermabond is placed to both sides.  The catheter was easily flushed and then locked with 1.6 cc of concentrated heparin and both ports.  A sterile dressing was applied.  Attention was turned to the right upper extremity.  Ultrasound was used to identify a very large inflow fistula and an outflow basilic vein at the antecubitum.  I elected to attempt graft in between the 2.  Transverse incision was created to the wrist and we dissected down to the fistula identified this and encircled with vessel loop.  At the antecubital we made a vertical incision dissected down to the basilic vein and encircled this and there was a very large sidebranch there as well.  We then tunneled on Artegraft between the 2.  I clamped the fistula proximally and transected this after marking for rotation.  We then drained out the pseudoaneurysm as there was thrombus within this.  We identified a very large sidebranch and ligated this.  There  was no backbleeding through the pseudoaneurysm.  I then spatulated the proximal fistula and the Artegraft and sewed these and and with 5-0 Prolene suture.  Upon completion I then flushed through the Artegraft and pulled this where the anastomosis was under the tunnel.  I reclamped the proximal fistula and  flush the Artegraft.  I then ligated the proximal basilic vein and transected this and spatulated it.  The graft was trimmed to size and spatulated and sewn end to end to the basilic vein as the runoff.  This was done with 5-0 Prolene suture.  Prior to completion we allowed flushing all directions.  Upon completion there was very strong flow through the Artegraft in the forearm and through the upper arm basilic vein this was confirmed with Doppler and we could traced up the upper arm.  Satisfied with this we irrigated the wounds we just closed over the graft with 4-0 Monocryl.  Dermabond was placed at the skin site.  The patient was awakened from anesthesia having tolerated the procedure without any complication.  All counts were correct at completion.   EBL: 50cc   Ticara Waner C. Randie Heinz, MD [facility] [place] Office: [phone] Pager: [phone]

## 2023-03-01 NOTE — Anesthesia Procedure Notes (Signed)
Procedure Name: LMA Insertion Date/Time: 03/01/2023 1:04 PM  Performed by: Rachel Moulds, CRNAPre-anesthesia Checklist: Patient identified, Emergency Drugs available, Suction available, Patient being monitored and Timeout performed Patient Re-evaluated:Patient Re-evaluated prior to induction Oxygen Delivery Method: Circle system utilized Preoxygenation: Pre-oxygenation with 100% oxygen Induction Type: IV induction Ventilation: Mask ventilation without difficulty LMA: LMA inserted LMA Size: 5.0 Number of attempts: 1 Placement Confirmation: breath sounds checked- equal and bilateral, CO2 detector and positive ETCO2 Tube secured with: Tape Dental Injury: Teeth and Oropharynx as per pre-operative assessment

## 2023-03-02 ENCOUNTER — Encounter (HOSPITAL_COMMUNITY): Payer: Self-pay | Admitting: Vascular Surgery

## 2023-03-02 DIAGNOSIS — N186 End stage renal disease: Secondary | ICD-10-CM | POA: Diagnosis not present

## 2023-03-02 DIAGNOSIS — Z23 Encounter for immunization: Secondary | ICD-10-CM | POA: Diagnosis not present

## 2023-03-02 DIAGNOSIS — Z992 Dependence on renal dialysis: Secondary | ICD-10-CM | POA: Diagnosis not present

## 2023-03-04 DIAGNOSIS — N186 End stage renal disease: Secondary | ICD-10-CM | POA: Diagnosis not present

## 2023-03-04 DIAGNOSIS — Z23 Encounter for immunization: Secondary | ICD-10-CM | POA: Diagnosis not present

## 2023-03-04 DIAGNOSIS — Z992 Dependence on renal dialysis: Secondary | ICD-10-CM | POA: Diagnosis not present

## 2023-03-07 DIAGNOSIS — Z992 Dependence on renal dialysis: Secondary | ICD-10-CM | POA: Diagnosis not present

## 2023-03-07 DIAGNOSIS — N186 End stage renal disease: Secondary | ICD-10-CM | POA: Diagnosis not present

## 2023-03-07 DIAGNOSIS — Z23 Encounter for immunization: Secondary | ICD-10-CM | POA: Diagnosis not present

## 2023-03-09 DIAGNOSIS — Z23 Encounter for immunization: Secondary | ICD-10-CM | POA: Diagnosis not present

## 2023-03-09 DIAGNOSIS — Z992 Dependence on renal dialysis: Secondary | ICD-10-CM | POA: Diagnosis not present

## 2023-03-09 DIAGNOSIS — N186 End stage renal disease: Secondary | ICD-10-CM | POA: Diagnosis not present

## 2023-03-11 DIAGNOSIS — Z23 Encounter for immunization: Secondary | ICD-10-CM | POA: Diagnosis not present

## 2023-03-11 DIAGNOSIS — Z992 Dependence on renal dialysis: Secondary | ICD-10-CM | POA: Diagnosis not present

## 2023-03-11 DIAGNOSIS — N186 End stage renal disease: Secondary | ICD-10-CM | POA: Diagnosis not present

## 2023-03-14 DIAGNOSIS — Z992 Dependence on renal dialysis: Secondary | ICD-10-CM | POA: Diagnosis not present

## 2023-03-14 DIAGNOSIS — N186 End stage renal disease: Secondary | ICD-10-CM | POA: Diagnosis not present

## 2023-03-14 DIAGNOSIS — Z23 Encounter for immunization: Secondary | ICD-10-CM | POA: Diagnosis not present

## 2023-03-16 ENCOUNTER — Ambulatory Visit (INDEPENDENT_AMBULATORY_CARE_PROVIDER_SITE_OTHER): Payer: 59 | Admitting: Physician Assistant

## 2023-03-16 VITALS — BP 92/64 | HR 85 | Temp 97.6°F | Ht 72.0 in | Wt 272.0 lb

## 2023-03-16 DIAGNOSIS — N186 End stage renal disease: Secondary | ICD-10-CM | POA: Diagnosis not present

## 2023-03-16 DIAGNOSIS — Z23 Encounter for immunization: Secondary | ICD-10-CM | POA: Diagnosis not present

## 2023-03-16 DIAGNOSIS — Z992 Dependence on renal dialysis: Secondary | ICD-10-CM | POA: Diagnosis not present

## 2023-03-16 NOTE — Progress Notes (Signed)
POST OPERATIVE OFFICE NOTE    CC:  F/u for surgery  HPI:  Brian Barrera is a 52 y.o. male who is s/p revision of right arm radiocephalic AV fistula with interposition Artegraft from proximal fistula to basilic vein and placement of right IJ TDC on 03/01/2023.  This was done for a malfunctioning right radiocephalic fistula with thrombosed outflow.  Pt returns today for follow up.  Pt states he is felt fine since surgery.  He feels like his old aneurysms have decreased in size.  He denies any issues with his incisions such as erythema, tenderness, or puslike drainage.  He denies any fevers or chills at home.  His right hand has intact sensation and motor.  He currently dialyzes via right IJ TDC at [facility].   No Known Allergies  Current Outpatient Medications  Medication Sig Dispense Refill   albuterol (ACCUNEB) 1.25 MG/3ML nebulizer solution Take 1 ampule by nebulization every 4 (four) hours as needed for wheezing or shortness of breath.     aspirin EC 81 MG tablet Take 81 mg by mouth daily.     atorvastatin (LIPITOR) 10 MG tablet Take 10 mg by mouth daily.     AURYXIA 1 GM 210 MG(Fe) tablet Take 420 mg by mouth 3 (three) times daily with meals. 420 with snack     cinacalcet (SENSIPAR) 30 MG tablet Take 30 mg by mouth daily.     diphenhydramine-acetaminophen (TYLENOL PM) 25-500 MG TABS tablet Take 1 tablet by mouth at bedtime as needed (sleep).     DULoxetine (CYMBALTA) 60 MG capsule Take 60 mg by mouth daily.     metoprolol tartrate (LOPRESSOR) 25 MG tablet Take 1.5 tablets (37.5 mg total) by mouth 2 (two) times daily. 270 tablet 3   midodrine (PROAMATINE) 5 MG tablet Take 5 mg by mouth every Monday, Wednesday, and Friday.     Multiple Vitamins-Minerals (MULTIVITAMIN WITH MINERALS) tablet Take 1 tablet by mouth daily. Men 50+     Omega-3 1000 MG CAPS Take 2,000 mg by mouth daily.     oxyCODONE-acetaminophen (PERCOCET) 10-325 MG tablet Take 1 tablet by mouth every 4 (four)  hours as needed for pain. 30 tablet 0   pregabalin (LYRICA) 75 MG capsule Take 75 mg by mouth 2 (two) times daily.     sevelamer carbonate (RENVELA) 800 MG tablet Take 1,600-3,200 mg by mouth See admin instructions. Take 3200 mg with meals and 1600 with snack     SYMBICORT 160-4.5 MCG/ACT inhaler Inhale 2 puffs into the lungs 2 (two) times daily.     No current facility-administered medications for this visit.     ROS:  See HPI  Physical Exam:  Incision: Right wrist and AC fossa incisions healing appropriately without erythema, drainage, or swelling Extremities: Palpable right radial pulse.  Right forearm AV graft with good thrill and palpation and good bruit and auscultation Neuro: Intact motor and sensation of right upper extremity    Assessment/Plan:  This is a 52 y.o. male who is here for postop visit  -The patient is doing well since revision of right radiocephalic AV fistula with interposition Artegraft.  -His right arm incisions are healing appropriately without signs of infection.  He denies any fevers or chills. -His right upper extremity is still warm and well-perfused.  He has a palpable right radial pulse.  He has intact motor and sensation of the right upper extremity -On exam his graft has a good thrill on palpation and a good bruit on  auscultation. -His dialysis center can begin using his graft on November 5.  If his graft can be used for at least 3-4 dialysis sessions without issues, his [facility] can be removed. -He can follow-up with our office as needed   Loel Dubonnet, PA-C [facility] [phone]   Clinic MD:  Randie Heinz

## 2023-03-18 DIAGNOSIS — Z23 Encounter for immunization: Secondary | ICD-10-CM | POA: Diagnosis not present

## 2023-03-18 DIAGNOSIS — N186 End stage renal disease: Secondary | ICD-10-CM | POA: Diagnosis not present

## 2023-03-18 DIAGNOSIS — Z992 Dependence on renal dialysis: Secondary | ICD-10-CM | POA: Diagnosis not present

## 2023-03-21 DIAGNOSIS — N186 End stage renal disease: Secondary | ICD-10-CM | POA: Diagnosis not present

## 2023-03-21 DIAGNOSIS — Z23 Encounter for immunization: Secondary | ICD-10-CM | POA: Diagnosis not present

## 2023-03-21 DIAGNOSIS — Z992 Dependence on renal dialysis: Secondary | ICD-10-CM | POA: Diagnosis not present

## 2023-03-22 DIAGNOSIS — M25569 Pain in unspecified knee: Secondary | ICD-10-CM | POA: Diagnosis not present

## 2023-03-22 DIAGNOSIS — M25511 Pain in right shoulder: Secondary | ICD-10-CM | POA: Diagnosis not present

## 2023-03-22 DIAGNOSIS — M549 Dorsalgia, unspecified: Secondary | ICD-10-CM | POA: Diagnosis not present

## 2023-03-22 DIAGNOSIS — Z79891 Long term (current) use of opiate analgesic: Secondary | ICD-10-CM | POA: Diagnosis not present

## 2023-03-22 DIAGNOSIS — M25512 Pain in left shoulder: Secondary | ICD-10-CM | POA: Diagnosis not present

## 2023-03-22 DIAGNOSIS — M542 Cervicalgia: Secondary | ICD-10-CM | POA: Diagnosis not present

## 2023-03-22 DIAGNOSIS — G894 Chronic pain syndrome: Secondary | ICD-10-CM | POA: Diagnosis not present

## 2023-03-23 DIAGNOSIS — Z992 Dependence on renal dialysis: Secondary | ICD-10-CM | POA: Diagnosis not present

## 2023-03-23 DIAGNOSIS — Z23 Encounter for immunization: Secondary | ICD-10-CM | POA: Diagnosis not present

## 2023-03-23 DIAGNOSIS — N186 End stage renal disease: Secondary | ICD-10-CM | POA: Diagnosis not present

## 2023-03-24 DIAGNOSIS — Z992 Dependence on renal dialysis: Secondary | ICD-10-CM | POA: Diagnosis not present

## 2023-03-24 DIAGNOSIS — N186 End stage renal disease: Secondary | ICD-10-CM | POA: Diagnosis not present

## 2023-03-25 DIAGNOSIS — N186 End stage renal disease: Secondary | ICD-10-CM | POA: Diagnosis not present

## 2023-03-25 DIAGNOSIS — Z992 Dependence on renal dialysis: Secondary | ICD-10-CM | POA: Diagnosis not present

## 2023-03-28 DIAGNOSIS — Z992 Dependence on renal dialysis: Secondary | ICD-10-CM | POA: Diagnosis not present

## 2023-03-28 DIAGNOSIS — N186 End stage renal disease: Secondary | ICD-10-CM | POA: Diagnosis not present

## 2023-03-29 ENCOUNTER — Ambulatory Visit (HOSPITAL_COMMUNITY): Payer: 59

## 2023-03-30 ENCOUNTER — Ambulatory Visit (INDEPENDENT_AMBULATORY_CARE_PROVIDER_SITE_OTHER): Payer: 59 | Admitting: Vascular Surgery

## 2023-03-30 ENCOUNTER — Encounter: Payer: Self-pay | Admitting: Vascular Surgery

## 2023-03-30 VITALS — BP 109/75 | HR 71 | Temp 97.8°F | Resp 20 | Ht 72.0 in | Wt 272.0 lb

## 2023-03-30 DIAGNOSIS — N186 End stage renal disease: Secondary | ICD-10-CM | POA: Diagnosis not present

## 2023-03-30 DIAGNOSIS — Z992 Dependence on renal dialysis: Secondary | ICD-10-CM | POA: Diagnosis not present

## 2023-03-30 NOTE — Progress Notes (Signed)
     Subjective:     Patient ID: Brian Barrera, male   DOB: [birth year], 52 y.o.   MRN: [id number]  HPI 52 year old male with end-stage renal disease on dialysis on Mondays, Wednesdays and Fridays with recent revision of the right radiocephalic AV fistula to interposition Artegraft and before that had a revision that failed.  He is now on dialysis via catheter.  He has having right forearm pain at the site of his previous pseudoaneurysm and is hoping to have this repaired.  Patient is right-hand dominant has a failed left forearm AV graft in the past.  He does not take any blood thinners.   Review of Systems Right forearm pain at site of pseudoaneurysm    Objective:   Physical Exam Vitals:   03/30/23 1157  BP: 109/75  Pulse: 71  Resp: 20  Temp: 97.8 F (36.6 C)  SpO2: 98%   Awake alert oriented Palpable left radial pulse Thrombosed left forearm AV loop graft Palpable thrombosis of recently placed right forearm Artegraft    Assessment/plan     52 year old male with end-stage renal disease currently dialyzing via right IJ tunneled dialysis catheter with a thrombosed recently placed right forearm AV graft.  He has an old thrombosed left forearm AV loop graft.  We have discussed his options and we will first plan for bilateral upper extremity venography to evaluate for central patency and perform either left or right upper extremity fistula versus graft on a nondialysis day after venography pending results.  He is also requesting revision of the pseudoaneurysm due to pain in the right forearm and this could be handled at the time of the larger operation.    Plan for venography in the near future for further dialysis planning.     Teisha Trowbridge C. Randie Heinz, MD [facility] [place] Office: [phone] Pager: [phone]

## 2023-04-01 DIAGNOSIS — N186 End stage renal disease: Secondary | ICD-10-CM | POA: Diagnosis not present

## 2023-04-01 DIAGNOSIS — Z992 Dependence on renal dialysis: Secondary | ICD-10-CM | POA: Diagnosis not present

## 2023-04-04 ENCOUNTER — Other Ambulatory Visit (HOSPITAL_COMMUNITY): Payer: Self-pay | Admitting: Nurse Practitioner

## 2023-04-04 ENCOUNTER — Ambulatory Visit (HOSPITAL_COMMUNITY)
Admission: RE | Admit: 2023-04-04 | Discharge: 2023-04-04 | Disposition: A | Discharge status: Home / Self Care | Payer: 59 | Source: Ambulatory Visit | Attending: Nurse Practitioner | Admitting: Nurse Practitioner

## 2023-04-04 DIAGNOSIS — N186 End stage renal disease: Secondary | ICD-10-CM | POA: Diagnosis not present

## 2023-04-04 DIAGNOSIS — Z4901 Encounter for fitting and adjustment of extracorporeal dialysis catheter: Secondary | ICD-10-CM | POA: Insufficient documentation

## 2023-04-04 DIAGNOSIS — Z992 Dependence on renal dialysis: Secondary | ICD-10-CM | POA: Diagnosis not present

## 2023-04-04 HISTORY — PX: IR FLUORO GUIDE CV LINE RIGHT: IMG2283

## 2023-04-04 MED ORDER — HEPARIN SODIUM (PORCINE) 1000 UNIT/ML IJ SOLN: 4000.0000 [IU] | Freq: Once | INTRAMUSCULAR | Status: DC

## 2023-04-04 MED ORDER — LIDOCAINE-EPINEPHRINE 1 %-1:100000 IJ SOLN
INTRAMUSCULAR | Status: AC
  Filled 2023-04-04: qty 1

## 2023-04-04 MED ORDER — LIDOCAINE-EPINEPHRINE 1 %-1:100000 IJ SOLN
20.0000 mL | Freq: Once | INTRAMUSCULAR | Status: AC
  Administered 2023-04-04: 10 mL via INTRADERMAL

## 2023-04-04 MED ORDER — HEPARIN SODIUM (PORCINE) 1000 UNIT/ML IJ SOLN
INTRAMUSCULAR | Status: AC
  Filled 2023-04-04: qty 10

## 2023-04-04 MED ORDER — CEFAZOLIN SODIUM-DEXTROSE 2-4 GM/100ML-% IV SOLN
2.0000 g | Freq: Once | INTRAVENOUS | Status: AC
  Administered 2023-04-04: 2 g via INTRAVENOUS

## 2023-04-04 MED ORDER — CEFAZOLIN SODIUM-DEXTROSE 2-4 GM/100ML-% IV SOLN
INTRAVENOUS | Status: AC
  Filled 2023-04-04: qty 100

## 2023-04-06 ENCOUNTER — Other Ambulatory Visit: Payer: Self-pay

## 2023-04-06 DIAGNOSIS — N186 End stage renal disease: Secondary | ICD-10-CM | POA: Diagnosis not present

## 2023-04-06 DIAGNOSIS — Z992 Dependence on renal dialysis: Secondary | ICD-10-CM | POA: Diagnosis not present

## 2023-04-06 MED ORDER — SODIUM CHLORIDE 0.9 % IV SOLN: 250.0000 mL | INTRAVENOUS | Status: AC | PRN

## 2023-04-07 ENCOUNTER — Encounter (HOSPITAL_COMMUNITY)
Admission: RE | Disposition: A | Discharge status: Home / Self Care | Payer: Self-pay | Source: Home / Self Care | Attending: Vascular Surgery

## 2023-04-07 ENCOUNTER — Other Ambulatory Visit: Payer: Self-pay

## 2023-04-07 ENCOUNTER — Ambulatory Visit (HOSPITAL_COMMUNITY)
Admission: RE | Admit: 2023-04-07 | Discharge: 2023-04-07 | Disposition: A | Discharge status: Home / Self Care | Payer: 59 | Attending: Vascular Surgery | Admitting: Vascular Surgery

## 2023-04-07 DIAGNOSIS — T82868A Thrombosis of vascular prosthetic devices, implants and grafts, initial encounter: Secondary | ICD-10-CM | POA: Diagnosis not present

## 2023-04-07 DIAGNOSIS — M79631 Pain in right forearm: Secondary | ICD-10-CM | POA: Diagnosis not present

## 2023-04-07 DIAGNOSIS — N186 End stage renal disease: Secondary | ICD-10-CM | POA: Insufficient documentation

## 2023-04-07 DIAGNOSIS — Z992 Dependence on renal dialysis: Secondary | ICD-10-CM | POA: Diagnosis not present

## 2023-04-07 DIAGNOSIS — Y841 Kidney dialysis as the cause of abnormal reaction of the patient, or of later complication, without mention of misadventure at the time of the procedure: Secondary | ICD-10-CM | POA: Diagnosis not present

## 2023-04-07 DIAGNOSIS — Z8679 Personal history of other diseases of the circulatory system: Secondary | ICD-10-CM | POA: Diagnosis not present

## 2023-04-07 HISTORY — PX: UPPER EXTREMITY VENOGRAPHY: CATH118272

## 2023-04-07 LAB — POCT I-STAT, CHEM 8
BUN: 53 mg/dL — ABNORMAL HIGH (ref 6–20)
Calcium, Ion: 0.97 mmol/L — ABNORMAL LOW (ref 1.15–1.40)
Chloride: 105 mmol/L (ref 98–111)
Creatinine, Ser: 12.2 mg/dL — ABNORMAL HIGH (ref 0.61–1.24)
Glucose, Bld: 78 mg/dL (ref 70–99)
HCT: 47 % (ref 39.0–52.0)
Hemoglobin: 16 g/dL (ref 13.0–17.0)
Potassium: 5.2 mmol/L — ABNORMAL HIGH (ref 3.5–5.1)
Sodium: 139 mmol/L (ref 135–145)
TCO2: 24 mmol/L (ref 22–32)

## 2023-04-07 SURGERY — UPPER EXTREMITY VENOGRAPHY
Anesthesia: LOCAL | Laterality: Bilateral

## 2023-04-07 MED ORDER — SODIUM CHLORIDE 0.9% FLUSH: 3.0000 mL | INTRAVENOUS | Status: DC | PRN

## 2023-04-07 MED ORDER — IODIXANOL 320 MG/ML IV SOLN
INTRAVENOUS | Status: DC | PRN
  Administered 2023-04-07: 100 mL via INTRAVENOUS

## 2023-04-07 MED ORDER — SODIUM CHLORIDE 0.9% FLUSH: 3.0000 mL | Freq: Two times a day (BID) | INTRAVENOUS | Status: DC

## 2023-04-07 MED ORDER — SODIUM CHLORIDE 0.9 % IV SOLN
INTRAVENOUS | Status: DC | PRN
  Administered 2023-04-07: 250 mL via INTRAVENOUS

## 2023-04-07 SURGICAL SUPPLY — 2 items
STOPCOCK MORSE 400PSI 3WAY (MISCELLANEOUS) IMPLANT
TUBING CIL FLEX 10 FLL-RA (TUBING) IMPLANT

## 2023-04-07 NOTE — H&P (Signed)
History and Physical Interval Note:  04/07/2023 2:31 PM  Brian Barrera  has presented today for surgery, with the diagnosis of End stage renal disease.  The various methods of treatment have been discussed with the patient and family. After consideration of risks, benefits and other options for treatment, the patient has consented to  Procedure(s): UPPER EXTREMITY VENOGRAPHY (Bilateral) as a surgical intervention.  The patient's history has been reviewed, patient examined, no change in status, stable for surgery.  I have reviewed the patient's chart and labs.  Questions were answered to the patient's satisfaction.     Cephus Shelling       Subjective:      Subjective Patient ID: Brian Barrera, male   DOB: 25-Aug-1970, 52 y.o.   MRN: [id number]   HPI 52 year old male with end-stage renal disease on dialysis on Mondays, Wednesdays and Fridays with recent revision of the right radiocephalic AV fistula to interposition Artegraft and before that had a revision that failed.  He is now on dialysis via catheter.  He has having right forearm pain at the site of his previous pseudoaneurysm and is hoping to have this repaired.  Patient is right-hand dominant has a failed left forearm AV graft in the past.  He does not take any blood thinners.     Review of Systems Right forearm pain at site of pseudoaneurysm     Objective:    Objective[] Expand by Default Physical Exam    Vitals:    03/30/23 1157  BP: 109/75  Pulse: 71  Resp: 20  Temp: 97.8 F (36.6 C)  SpO2: 98%    Awake alert oriented Palpable left radial pulse Thrombosed left forearm AV loop graft Palpable thrombosis of recently placed right forearm Artegraft     Assessment/plan    Assessment 52 year old male with end-stage renal disease currently dialyzing via right IJ tunneled dialysis catheter with a thrombosed recently placed right forearm AV graft.  He has an old thrombosed left forearm AV loop graft.  We have  discussed his options and we will first plan for bilateral upper extremity venography to evaluate for central patency and perform either left or right upper extremity fistula versus graft on a nondialysis day after venography pending results.  He is also requesting revision of the pseudoaneurysm due to pain in the right forearm and this could be handled at the time of the larger operation.     Plan for venography in the near future for further dialysis planning.       Brandon C. Randie Heinz, MD [facility] [place] Office: [phone] Pager: [phone]

## 2023-04-07 NOTE — Op Note (Signed)
Date: April 07, 2023  Preoperative diagnosis: End-stage renal disease with failed dialysis access in both upper extremities  Postoperative diagnosis: Same  Procedure: Bilateral upper extremity venogram  Surgeon: Dr. Cephus Shelling, MD  Indications: 52 year old male with end-stage renal disease that has had a right radiocephalic AV fistula that has been revised with Artegraft and subsequently failed and also a left forearm AV graft that has also failed.  He is right-hand dominant.  He presents for venogram to plan next access after recent evaluation by Dr. Randie Heinz.  Findings: 22 IV right hand.  We could never identify any central veins after multiple injections other than some upper arm brachial and axillary vein.  Unclear if the [facility] catheter on the right is occlusive versus central venous occlusion.  On the left with a 20-gauge IV in the brachial vein in the upper arm the patient had a patent brachial veins, axillary vein, subclavian vein, innominate vein and SVC.  There is some disease in the left subclavian vein but it does not appear flow-limiting.  Will schedule for left arm AV fistula versus graft with Dr. Randie Heinz.  Details: Patient was taken to the cath lab after informed consent was obtained.  Placed on operative table in supine position.  Ultimately he had a 22 IV in both hands that were placed prior to the start of his procedure.  Initially started by injecting contrast into the 22 gauge IV in his right hand.  After multiple injections including using different positions we could never visualize the central veins on the right.  Unclear if the catheter on that side is occlusive.  Ultimately the IV in the left hand was difficult to use so we placed a second IV in the brachial vein in the upper arm.  Injection on the left showed patent upper arm brachial veins, axillary vein, subclavian vein, innominate vein and SVC.  He does have some disease in the left subclavian vein but is not appear  flow-limiting.  Seems the best option will be left upper extremity access as we could prove that the central venous system was patent on the left.  Patient remained stable.  Anesthesia: None  Complication: None  Cephus Shelling, MD [facility] [place] Office: [phone]   Cephus Shelling

## 2023-04-08 ENCOUNTER — Other Ambulatory Visit: Payer: Self-pay

## 2023-04-08 ENCOUNTER — Encounter (HOSPITAL_COMMUNITY): Payer: Self-pay | Admitting: Vascular Surgery

## 2023-04-08 DIAGNOSIS — Z992 Dependence on renal dialysis: Secondary | ICD-10-CM | POA: Diagnosis not present

## 2023-04-08 DIAGNOSIS — N186 End stage renal disease: Secondary | ICD-10-CM

## 2023-04-11 DIAGNOSIS — Z992 Dependence on renal dialysis: Secondary | ICD-10-CM | POA: Diagnosis not present

## 2023-04-11 DIAGNOSIS — N186 End stage renal disease: Secondary | ICD-10-CM | POA: Diagnosis not present

## 2023-04-13 DIAGNOSIS — Z992 Dependence on renal dialysis: Secondary | ICD-10-CM | POA: Diagnosis not present

## 2023-04-13 DIAGNOSIS — N186 End stage renal disease: Secondary | ICD-10-CM | POA: Diagnosis not present

## 2023-04-15 DIAGNOSIS — N186 End stage renal disease: Secondary | ICD-10-CM | POA: Diagnosis not present

## 2023-04-15 DIAGNOSIS — Z992 Dependence on renal dialysis: Secondary | ICD-10-CM | POA: Diagnosis not present

## 2023-04-18 DIAGNOSIS — N186 End stage renal disease: Secondary | ICD-10-CM | POA: Diagnosis not present

## 2023-04-18 DIAGNOSIS — Z992 Dependence on renal dialysis: Secondary | ICD-10-CM | POA: Diagnosis not present

## 2023-04-20 DIAGNOSIS — N186 End stage renal disease: Secondary | ICD-10-CM | POA: Diagnosis not present

## 2023-04-20 DIAGNOSIS — Z992 Dependence on renal dialysis: Secondary | ICD-10-CM | POA: Diagnosis not present

## 2023-04-22 DIAGNOSIS — Z992 Dependence on renal dialysis: Secondary | ICD-10-CM | POA: Diagnosis not present

## 2023-04-22 DIAGNOSIS — N186 End stage renal disease: Secondary | ICD-10-CM | POA: Diagnosis not present

## 2023-04-23 DIAGNOSIS — N186 End stage renal disease: Secondary | ICD-10-CM | POA: Diagnosis not present

## 2023-04-23 DIAGNOSIS — Z992 Dependence on renal dialysis: Secondary | ICD-10-CM | POA: Diagnosis not present

## 2023-04-25 DIAGNOSIS — N2581 Secondary hyperparathyroidism of renal origin: Secondary | ICD-10-CM | POA: Diagnosis not present

## 2023-04-25 DIAGNOSIS — Z992 Dependence on renal dialysis: Secondary | ICD-10-CM | POA: Diagnosis not present

## 2023-04-25 DIAGNOSIS — N186 End stage renal disease: Secondary | ICD-10-CM | POA: Diagnosis not present

## 2023-04-27 DIAGNOSIS — N2581 Secondary hyperparathyroidism of renal origin: Secondary | ICD-10-CM | POA: Diagnosis not present

## 2023-04-27 DIAGNOSIS — N186 End stage renal disease: Secondary | ICD-10-CM | POA: Diagnosis not present

## 2023-04-27 DIAGNOSIS — Z992 Dependence on renal dialysis: Secondary | ICD-10-CM | POA: Diagnosis not present

## 2023-04-29 DIAGNOSIS — N2581 Secondary hyperparathyroidism of renal origin: Secondary | ICD-10-CM | POA: Diagnosis not present

## 2023-04-29 DIAGNOSIS — Z992 Dependence on renal dialysis: Secondary | ICD-10-CM | POA: Diagnosis not present

## 2023-04-29 DIAGNOSIS — N186 End stage renal disease: Secondary | ICD-10-CM | POA: Diagnosis not present

## 2023-05-01 ENCOUNTER — Other Ambulatory Visit: Payer: Self-pay

## 2023-05-01 ENCOUNTER — Encounter (HOSPITAL_COMMUNITY): Payer: Self-pay | Admitting: Vascular Surgery

## 2023-05-01 NOTE — Progress Notes (Signed)
PCP - Juliette Alcide, MD Cardiologist - Antoine Poche, MD  EKG - 07/09/2022 Chest x-ray - 02/24/2023 ECHO - 01/07/2022 Cardiac Cath - Denies  Sleep Study- Denies  Blood Thinner Instructions: Denies Aspirin Instructions: Follow your MD instructions  ERAS Protcol - N/A. NPO ordered COVID TEST- N/A  Anesthesia review: Yes, cardiac hx  -------------  SDW INSTRUCTIONS:  Your procedure is scheduled on Tuesday Dec 10 th. Please report to [facility] Main Entrance "A" at 0745 A.M., and check in at the Admitting office. Call this number if you have problems the morning of surgery: [phone]   Remember: Do not eat or drink after midnight the night before your surgery     Medications to take morning of surgery with a sip of water include: DULoxetine (CYMBALTA)  metoprolol tartrate (LOPRESSOR)  pregabalin (LYRICA)  SYMBICORT 160-4.5 MCG/ACT inhaler   IF NEEDED albuterol (ACCUNEB) .  Please bring on DOS oxyCODONE-acetaminophen (PERCOCET)    As of today, STOP taking any Aspirin (unless otherwise instructed by your surgeon), Aleve, Naproxen, Ibuprofen, Motrin, Advil, Goody's, BC's, all herbal medications, fish oil, and all vitamins.    The Morning of Surgery Do not wear jewelry, make-up or nail polish. Do not wear lotions, powders, or perfumes/colognes, or deodorant Do not bring valuables to the hospital. [facility] is not responsible for any belongings or valuables.  If you are a smoker, DO NOT Smoke 24 hours prior to surgery  If you wear a CPAP at night please bring your mask the morning of surgery   Remember that you must have someone to transport you home after your surgery, and remain with you for 24 hours if you are discharged the same day.  Please bring cases for contacts, glasses, hearing aids, dentures or bridgework because it cannot be worn into surgery.   Patients discharged the day of surgery will not be allowed to drive home.   Please shower the NIGHT  BEFORE/MORNING OF SURGERY (use antibacterial soap like DIAL soap if possible). Wear comfortable clothes the morning of surgery. Oral Hygiene is also important to reduce your risk of infection.  Remember - BRUSH YOUR TEETH THE MORNING OF SURGERY WITH YOUR REGULAR TOOTHPASTE  Patient denies shortness of breath, fever, cough and chest pain.

## 2023-05-02 DIAGNOSIS — Z992 Dependence on renal dialysis: Secondary | ICD-10-CM | POA: Diagnosis not present

## 2023-05-02 DIAGNOSIS — N2581 Secondary hyperparathyroidism of renal origin: Secondary | ICD-10-CM | POA: Diagnosis not present

## 2023-05-02 DIAGNOSIS — N186 End stage renal disease: Secondary | ICD-10-CM | POA: Diagnosis not present

## 2023-05-03 ENCOUNTER — Other Ambulatory Visit: Payer: Self-pay

## 2023-05-03 ENCOUNTER — Ambulatory Visit (HOSPITAL_COMMUNITY)
Admission: RE | Admit: 2023-05-03 | Discharge: 2023-05-03 | Disposition: A | Discharge status: Home / Self Care | Payer: 59 | Attending: Vascular Surgery | Admitting: Vascular Surgery

## 2023-05-03 ENCOUNTER — Ambulatory Visit (HOSPITAL_COMMUNITY): Payer: 59 | Admitting: Physician Assistant

## 2023-05-03 ENCOUNTER — Encounter (HOSPITAL_COMMUNITY)
Admission: RE | Disposition: A | Discharge status: Home / Self Care | Payer: Self-pay | Source: Home / Self Care | Attending: Vascular Surgery

## 2023-05-03 ENCOUNTER — Ambulatory Visit (HOSPITAL_BASED_OUTPATIENT_CLINIC_OR_DEPARTMENT_OTHER): Payer: 59 | Admitting: Physician Assistant

## 2023-05-03 ENCOUNTER — Encounter (HOSPITAL_COMMUNITY): Payer: Self-pay | Admitting: Vascular Surgery

## 2023-05-03 DIAGNOSIS — Z992 Dependence on renal dialysis: Secondary | ICD-10-CM | POA: Diagnosis not present

## 2023-05-03 DIAGNOSIS — I509 Heart failure, unspecified: Secondary | ICD-10-CM | POA: Diagnosis not present

## 2023-05-03 DIAGNOSIS — T82898A Other specified complication of vascular prosthetic devices, implants and grafts, initial encounter: Secondary | ICD-10-CM | POA: Diagnosis not present

## 2023-05-03 DIAGNOSIS — N185 Chronic kidney disease, stage 5: Secondary | ICD-10-CM | POA: Diagnosis not present

## 2023-05-03 DIAGNOSIS — E66813 Obesity, class 3: Secondary | ICD-10-CM | POA: Diagnosis not present

## 2023-05-03 DIAGNOSIS — N186 End stage renal disease: Secondary | ICD-10-CM

## 2023-05-03 DIAGNOSIS — F17200 Nicotine dependence, unspecified, uncomplicated: Secondary | ICD-10-CM | POA: Diagnosis not present

## 2023-05-03 DIAGNOSIS — Z6835 Body mass index (BMI) 35.0-35.9, adult: Secondary | ICD-10-CM | POA: Insufficient documentation

## 2023-05-03 DIAGNOSIS — J449 Chronic obstructive pulmonary disease, unspecified: Secondary | ICD-10-CM | POA: Diagnosis not present

## 2023-05-03 DIAGNOSIS — I132 Hypertensive heart and chronic kidney disease with heart failure and with stage 5 chronic kidney disease, or end stage renal disease: Secondary | ICD-10-CM

## 2023-05-03 DIAGNOSIS — I5032 Chronic diastolic (congestive) heart failure: Secondary | ICD-10-CM | POA: Diagnosis not present

## 2023-05-03 DIAGNOSIS — M1990 Unspecified osteoarthritis, unspecified site: Secondary | ICD-10-CM | POA: Insufficient documentation

## 2023-05-03 DIAGNOSIS — X58XXXA Exposure to other specified factors, initial encounter: Secondary | ICD-10-CM | POA: Insufficient documentation

## 2023-05-03 DIAGNOSIS — F17210 Nicotine dependence, cigarettes, uncomplicated: Secondary | ICD-10-CM | POA: Diagnosis not present

## 2023-05-03 HISTORY — DX: Cardiac arrhythmia, unspecified: I49.9

## 2023-05-03 HISTORY — PX: AV FISTULA PLACEMENT: SHX1204

## 2023-05-03 SURGERY — ARTERIOVENOUS (AV) FISTULA CREATION
Anesthesia: General | Site: Arm Upper | Laterality: Left

## 2023-05-03 MED ORDER — PHENYLEPHRINE 80 MCG/ML (10ML) SYRINGE FOR IV PUSH (FOR BLOOD PRESSURE SUPPORT)
PREFILLED_SYRINGE | INTRAVENOUS | Status: DC | PRN
  Administered 2023-05-03 (×3): 80 ug via INTRAVENOUS

## 2023-05-03 MED ORDER — SODIUM CHLORIDE 0.9 % IV SOLN
3.0000 g | INTRAVENOUS | Status: AC
  Administered 2023-05-03: 3 g via INTRAVENOUS
  Filled 2023-05-03: qty 3

## 2023-05-03 MED ORDER — FENTANYL CITRATE (PF) 250 MCG/5ML IJ SOLN
INTRAMUSCULAR | Status: AC
  Filled 2023-05-03: qty 5

## 2023-05-03 MED ORDER — ONDANSETRON HCL 4 MG/2ML IJ SOLN
INTRAMUSCULAR | Status: AC
  Filled 2023-05-03: qty 2

## 2023-05-03 MED ORDER — LIDOCAINE 2% (20 MG/ML) 5 ML SYRINGE
INTRAMUSCULAR | Status: DC | PRN
  Administered 2023-05-03: 40 mg via INTRAVENOUS

## 2023-05-03 MED ORDER — ONDANSETRON HCL 4 MG/2ML IJ SOLN
INTRAMUSCULAR | Status: DC | PRN
  Administered 2023-05-03: 4 mg via INTRAVENOUS

## 2023-05-03 MED ORDER — SODIUM CHLORIDE 0.9% FLUSH: 10.0000 mL | Freq: Two times a day (BID) | INTRAVENOUS | Status: DC

## 2023-05-03 MED ORDER — PROPOFOL 10 MG/ML IV BOLUS
INTRAVENOUS | Status: AC
  Filled 2023-05-03: qty 20

## 2023-05-03 MED ORDER — CHLORHEXIDINE GLUCONATE 0.12 % MT SOLN
15.0000 mL | Freq: Once | OROMUCOSAL | Status: AC
  Administered 2023-05-03: 15 mL via OROMUCOSAL
  Filled 2023-05-03: qty 15

## 2023-05-03 MED ORDER — CHLORHEXIDINE GLUCONATE 4 % EX SOLN: 60.0000 mL | Freq: Once | CUTANEOUS | Status: DC

## 2023-05-03 MED ORDER — MIDAZOLAM HCL 2 MG/2ML IJ SOLN
INTRAMUSCULAR | Status: AC
  Filled 2023-05-03: qty 2

## 2023-05-03 MED ORDER — LIDOCAINE-EPINEPHRINE (PF) 1 %-1:200000 IJ SOLN
INTRAMUSCULAR | Status: AC
  Filled 2023-05-03: qty 30

## 2023-05-03 MED ORDER — PHENYLEPHRINE HCL-NACL 20-0.9 MG/250ML-% IV SOLN
INTRAVENOUS | Status: DC | PRN
  Administered 2023-05-03: 40 ug/min via INTRAVENOUS

## 2023-05-03 MED ORDER — LIDOCAINE 2% (20 MG/ML) 5 ML SYRINGE
INTRAMUSCULAR | Status: AC
  Filled 2023-05-03: qty 5

## 2023-05-03 MED ORDER — HEPARIN 6000 UNIT IRRIGATION SOLUTION
Status: AC
  Filled 2023-05-03: qty 500

## 2023-05-03 MED ORDER — HEPARIN 6000 UNIT IRRIGATION SOLUTION
Status: DC | PRN
  Administered 2023-05-03: 1

## 2023-05-03 MED ORDER — DEXAMETHASONE SODIUM PHOSPHATE 10 MG/ML IJ SOLN
INTRAMUSCULAR | Status: DC | PRN
  Administered 2023-05-03: 5 mg via INTRAVENOUS

## 2023-05-03 MED ORDER — 0.9 % SODIUM CHLORIDE (POUR BTL) OPTIME
TOPICAL | Status: DC | PRN
  Administered 2023-05-03: 1000 mL

## 2023-05-03 MED ORDER — HEPARIN SODIUM (PORCINE) 1000 UNIT/ML IJ SOLN
INTRAMUSCULAR | Status: AC
  Filled 2023-05-03: qty 10

## 2023-05-03 MED ORDER — DEXAMETHASONE SODIUM PHOSPHATE 10 MG/ML IJ SOLN
INTRAMUSCULAR | Status: AC
  Filled 2023-05-03: qty 1

## 2023-05-03 MED ORDER — FENTANYL CITRATE (PF) 100 MCG/2ML IJ SOLN
INTRAMUSCULAR | Status: AC
  Filled 2023-05-03: qty 2

## 2023-05-03 MED ORDER — LIDOCAINE HCL (PF) 1 % IJ SOLN
INTRAMUSCULAR | Status: AC
  Filled 2023-05-03: qty 30

## 2023-05-03 MED ORDER — FENTANYL CITRATE (PF) 250 MCG/5ML IJ SOLN
INTRAMUSCULAR | Status: DC | PRN
  Administered 2023-05-03: 50 ug via INTRAVENOUS
  Administered 2023-05-03: 100 ug via INTRAVENOUS
  Administered 2023-05-03: 50 ug via INTRAVENOUS

## 2023-05-03 MED ORDER — SODIUM CHLORIDE 0.9 % IV SOLN: INTRAVENOUS | Status: DC | PRN

## 2023-05-03 MED ORDER — ORAL CARE MOUTH RINSE: 15.0000 mL | Freq: Once | OROMUCOSAL | Status: AC

## 2023-05-03 MED ORDER — PROPOFOL 10 MG/ML IV BOLUS
INTRAVENOUS | Status: DC | PRN
  Administered 2023-05-03: 150 mg via INTRAVENOUS
  Administered 2023-05-03: 50 mg via INTRAVENOUS

## 2023-05-03 SURGICAL SUPPLY — 27 items
ARMBAND PINK RESTRICT EXTREMIT (MISCELLANEOUS) ×1 IMPLANT
BAG COUNTER SPONGE SURGICOUNT (BAG) ×1 IMPLANT
CANISTER SUCT 3000ML PPV (MISCELLANEOUS) ×1 IMPLANT
CLIP LIGATING EXTRA MED SLVR (CLIP) ×1 IMPLANT
CLIP LIGATING EXTRA SM BLUE (MISCELLANEOUS) ×1 IMPLANT
COVER PROBE W GEL 5X96 (DRAPES) IMPLANT
DERMABOND ADVANCED .7 DNX12 (GAUZE/BANDAGES/DRESSINGS) ×1 IMPLANT
ELECT REM PT RETURN 9FT ADLT (ELECTROSURGICAL) ×1 IMPLANT
ELECTRODE REM PT RTRN 9FT ADLT (ELECTROSURGICAL) ×1 IMPLANT
GLOVE BIO SURGEON STRL SZ7.5 (GLOVE) ×1 IMPLANT
GOWN STRL REUS W/ TWL LRG LVL3 (GOWN DISPOSABLE) ×2 IMPLANT
GOWN STRL REUS W/ TWL XL LVL3 (GOWN DISPOSABLE) ×1 IMPLANT
INSERT FOGARTY SM (MISCELLANEOUS) IMPLANT
KIT BASIN OR (CUSTOM PROCEDURE TRAY) ×1 IMPLANT
KIT TURNOVER KIT B (KITS) ×1 IMPLANT
NS IRRIG 1000ML POUR BTL (IV SOLUTION) ×1 IMPLANT
PACK CV ACCESS (CUSTOM PROCEDURE TRAY) ×1 IMPLANT
PAD ARMBOARD 7.5X6 YLW CONV (MISCELLANEOUS) ×2 IMPLANT
POWDER SURGICEL 3.0 GRAM (HEMOSTASIS) IMPLANT
SLING ARM FOAM STRAP LRG (SOFTGOODS) IMPLANT
SLING ARM FOAM STRAP MED (SOFTGOODS) IMPLANT
SUT MNCRL AB 4-0 PS2 18 (SUTURE) ×1 IMPLANT
SUT PROLENE 6 0 BV (SUTURE) ×1 IMPLANT
SUT VIC AB 3-0 SH 27X BRD (SUTURE) ×1 IMPLANT
TOWEL GREEN STERILE (TOWEL DISPOSABLE) ×1 IMPLANT
UNDERPAD 30X36 HEAVY ABSORB (UNDERPADS AND DIAPERS) ×1 IMPLANT
WATER STERILE IRR 1000ML POUR (IV SOLUTION) ×1 IMPLANT

## 2023-05-03 NOTE — Transfer of Care (Signed)
Immediate Anesthesia Transfer of Care Note  Patient: Brian Barrera  Procedure(s) Performed: LEFT ARM BASILIC VEIN FISTULA CREATION (Left: Arm Upper)  Patient Location: PACU  Anesthesia Type:General  Level of Consciousness: awake, alert , oriented, and patient cooperative  Airway & Oxygen Therapy: Patient Spontanous Breathing and Patient connected to nasal cannula oxygen  Post-op Assessment: Report given to RN and Post -op Vital signs reviewed and stable  Post vital signs: Reviewed and stable  Last Vitals:  Vitals Value Taken Time  BP 140/99 05/03/23 1149  Temp    Pulse 74 05/03/23 1150  Resp 16 05/03/23 1150  SpO2 97 % 05/03/23 1150  Vitals shown include unfiled device data.  Last Pain:  Vitals:   05/03/23 0817  TempSrc:   PainSc: 7       Patients Stated Pain Goal: 2 (05/03/23 0817)  Complications: No notable events documented.

## 2023-05-03 NOTE — Discharge Instructions (Signed)
[facility]  Discharge Instructions  AV Fistula or Graft Surgery for Dialysis Access  Please refer to the following instructions for your post-procedure care. Your surgeon or physician assistant will discuss any changes with you.  Activity  You may drive the day following your surgery, if you are comfortable and no longer taking prescription pain medication. Resume full activity as the soreness in your incision resolves.  Bathing/Showering  You may shower after you go home. Keep your incision dry for 48 hours. Do not soak in a bathtub, hot tub, or swim until the incision heals completely. You may not shower if you have a hemodialysis catheter.  Incision Care  Clean your incision with mild soap and water after 48 hours. Pat the area dry with a clean towel. You do not need a bandage unless otherwise instructed. Do not apply any ointments or creams to your incision. You may have skin glue on your incision. Do not peel it off. It will come off on its own in about one week. Your arm may swell a bit after surgery. To reduce swelling use pillows to elevate your arm so it is above your heart. Your doctor will tell you if you need to lightly wrap your arm with an ACE bandage.  Diet  Resume your normal diet. There are not special food restrictions following this procedure. In order to heal from your surgery, it is CRITICAL to get adequate nutrition. Your body requires vitamins, minerals, and protein. Vegetables are the best source of vitamins and minerals. Vegetables also provide the perfect balance of protein. Processed food has little nutritional value, so try to avoid this.  Medications  Resume taking all of your medications. If your incision is causing pain, you may take over-the counter pain relievers such as acetaminophen (Tylenol). If you were prescribed a stronger pain medication, please be aware these medications can cause nausea and constipation. Prevent  nausea by taking the medication with a snack or meal. Avoid constipation by drinking plenty of fluids and eating foods with high amount of fiber, such as fruits, vegetables, and grains.  Do not take Tylenol if you are taking prescription pain medications.  Follow up Your surgeon may want to see you in the office following your access surgery. If so, this will be arranged at the time of your surgery.  Please call us immediately for any of the following conditions:  Increased pain, redness, drainage (pus) from your incision site Fever of 101 degrees or higher Severe or worsening pain at your incision site Hand pain or numbness.  Reduce your risk of vascular disease:  Stop smoking. If you would like help, call [facility] at 1-800-QUIT-NOW (973-427-2393) or [place] at [phone]  Manage your cholesterol Maintain a desired weight Control your diabetes Keep your blood pressure down  Dialysis  It will take several weeks to several months for your new dialysis access to be ready for use. Your surgeon will determine when it is okay to use it. Your nephrologist will continue to direct your dialysis. You can continue to use your Permcath until your new access is ready for use.   05/03/2023 TRACKER MULLINGS [id number] 1970/12/16  Surgeon(s): Maeola Harman, MD  Procedure(s): LEFT ARM BASILIC VEIN FISTULA CREATION   May stick graft immediately   May stick graft on designated area only:   X Do not stick left AV fistula for 12 weeks    If you have any questions, please call the office  at [phone].

## 2023-05-03 NOTE — Op Note (Addendum)
    Patient name: Brian Barrera MRN: [id number] DOB: 28-Feb-1971 Sex: male  05/03/2023 Pre-operative Diagnosis: End-stage renal disease Post-operative diagnosis:  Same Surgeon:  Apolinar Junes C. Randie Heinz, MD Assistant: Nathanial Rancher, PA Procedure Performed:  Left upper arm brachial artery to basilic vein AV fistula creation  Indications: 52 year old male with history of end-stage renal disease with previous bilateral upper extremity dialysis access.  He has undergone bilateral upper extremity venography and is now indicated for left arm fistula versus graft which he presents today.  We have discussed the risk benefits and alternatives and he demonstrates good understanding.  In experience assistant was necessary to facilitate exposure of both the vein and artery as well as performing anastomosis.  Findings: The basilic vein above the antecubital measured approximately 5 mm and after approximately 4 cm this did connect to the deep system.  Given the large size of the vein and large brachial artery which also measured 5 mm above the antecubitum I elected for basilic vein fistula that will need to be created in 2 stages.  At completion there is a very strong thrill and a weaker radial pulse at the wrist that did augment on Doppler with compression of the fistula.   Procedure:  The patient was identified in the holding area and taken to the operating room where LMA anesthesia was induced.  He was sterilely prepped and draped in the left upper extremity in the usual fashion, antibiotics were administered a timeout was called.  We began using ultrasound we identified a very large basilic vein above the antecubitum as well as brachial artery.  A transverse incision was created between the 2.  We dissected out the vein first dividing branches between ties and then dissected to the deep fascia the brachial artery encircled this with vessel loop.  The vein was then tied off distally and transected spatulated flushed  with heparinized saline and clamped.  The artery is clamped distally and proximally opened longitudinally flushed with heparinized saline distally only given its large size.  The vein was then sewn into side with 6-0 Prolene suture and prior to completion we allowed flushing all directions.  Upon completion there was a very strong thrill in the fistula and a radial artery pulse at the wrist and the radial artery signal that augmented with compression of the fistula.  We did free up some of the soft tissue and divided a further branch on the fistula to allow it to sit nicely and at completion it had no root tension and a very strong thrill.  We then irrigated the wound obtain hemostasis and closed in layers of Vicryl and Monocryl.  Dermabond was placed at the skin level.  The patient was awakened from anesthesia having tolerated the procedure without any complication.  All counts were correct at completion.  EBL: 20 cc    Cartha Rotert C. Randie Heinz, MD [facility] Office: [phone] Pager: [phone]

## 2023-05-03 NOTE — Anesthesia Preprocedure Evaluation (Addendum)
Anesthesia Evaluation  Patient identified by MRN, date of birth, ID band Patient awake    Reviewed: Allergy & Precautions, H&P , NPO status , Patient's Chart, lab work & pertinent test results  Airway Mallampati: II   Neck ROM: full    Dental   Pulmonary asthma , COPD, Current Smoker   breath sounds clear to auscultation       Cardiovascular hypertension, +CHF   Rhythm:regular Rate:Normal  EF 50-55%   Neuro/Psych    GI/Hepatic   Endo/Other    Class 3 obesity  Renal/GU ESRFRenal disease     Musculoskeletal  (+) Arthritis ,    Abdominal   Peds  Hematology   Anesthesia Other Findings   Reproductive/Obstetrics                             Anesthesia Physical Anesthesia Plan  ASA: 4  Anesthesia Plan: General   Post-op Pain Management:    Induction: Intravenous  PONV Risk Score and Plan: 1 and Ondansetron, Dexamethasone, Midazolam and Treatment may vary due to age or medical condition  Airway Management Planned: LMA  Additional Equipment:   Intra-op Plan:   Post-operative Plan: Extubation in OR  Informed Consent: I have reviewed the patients History and Physical, chart, labs and discussed the procedure including the risks, benefits and alternatives for the proposed anesthesia with the patient or authorized representative who has indicated his/her understanding and acceptance.     Dental advisory given  Plan Discussed with: CRNA, Anesthesiologist and Surgeon  Anesthesia Plan Comments:        Anesthesia Quick Evaluation

## 2023-05-03 NOTE — Anesthesia Procedure Notes (Addendum)
Procedure Name: LMA Insertion Date/Time: 05/03/2023 10:33 AM  Performed by: Ammie Dalton, CRNAPre-anesthesia Checklist: Patient identified, Emergency Drugs available, Timeout performed, Patient being monitored and Suction available Patient Re-evaluated:Patient Re-evaluated prior to induction Oxygen Delivery Method: Circle system utilized Preoxygenation: Pre-oxygenation with 100% oxygen Induction Type: IV induction Ventilation: Mask ventilation without difficulty LMA: LMA inserted LMA Size: 5.0 Number of attempts: 1 Dental Injury: Teeth and Oropharynx as per pre-operative assessment  Comments: Performed by Carroll Sage

## 2023-05-03 NOTE — H&P (Signed)
     HPI 52 year old male with end-stage renal disease on dialysis on Mondays, Wednesdays and Fridays with recent revision of the right radiocephalic AV fistula to interposition Artegraft and before that had a revision that failed.  He is now on dialysis via catheter.  He has having right forearm pain at the site of his previous pseudoaneurysm and is hoping to have this repaired.  Patient is right-hand dominant has a failed left forearm AV graft in the past.  He does not take any blood thinners.     Review of Systems  Constitutional: Negative.   HENT: Negative.    Respiratory: Negative.    Cardiovascular: Negative.   Gastrointestinal: Negative.   Musculoskeletal:        Right forearm pain at site of pseudoaneurysm  Skin: Negative.   Neurological: Negative.   Endo/Heme/Allergies: Negative.   Psychiatric/Behavioral: Negative.        Objective:    Vitals:   05/03/23 0806  BP: (!) 152/108  Pulse: 71  Resp: 18  Temp: 98.2 F (36.8 C)  SpO2: 98%    Awake alert oriented Palpable left radial pulse Thrombosed left forearm AV loop graft Palpable thrombosis of recently placed right forearm Artegraft     Assessment/plan   52 year old male with end-stage renal disease currently dialyzing via right IJ tunneled dialysis catheter with a thrombosed recently placed right forearm AV graft.  He has an old thrombosed left forearm AV loop graft.  We have discussed proceeding with left arm access today in the OR fistula vs graft and can consider excision of right arm pseudoaneurysm in the future if necessary. All questions answered and consent signed.    Chaka Jefferys C. Randie Heinz, MD [facility] Office: [phone] Pager: [phone]

## 2023-05-04 ENCOUNTER — Encounter (HOSPITAL_COMMUNITY): Payer: Self-pay | Admitting: Vascular Surgery

## 2023-05-04 DIAGNOSIS — Z992 Dependence on renal dialysis: Secondary | ICD-10-CM | POA: Diagnosis not present

## 2023-05-04 DIAGNOSIS — N2581 Secondary hyperparathyroidism of renal origin: Secondary | ICD-10-CM | POA: Diagnosis not present

## 2023-05-04 DIAGNOSIS — N186 End stage renal disease: Secondary | ICD-10-CM | POA: Diagnosis not present

## 2023-05-05 NOTE — Anesthesia Postprocedure Evaluation (Signed)
Anesthesia Post Note  Patient: Brian Barrera  Procedure(s) Performed: LEFT ARM BASILIC VEIN FISTULA CREATION (Left: Arm Upper)     Patient location during evaluation: PACU Anesthesia Type: General Level of consciousness: awake and alert Pain management: pain level controlled Vital Signs Assessment: post-procedure vital signs reviewed and stable Respiratory status: spontaneous breathing, nonlabored ventilation, respiratory function stable and patient connected to nasal cannula oxygen Cardiovascular status: blood pressure returned to baseline and stable Postop Assessment: no apparent nausea or vomiting Anesthetic complications: no   No notable events documented.  Last Vitals:  Vitals:   05/03/23 1200 05/03/23 1215  BP: (!) 136/102 (!) 147/106  Pulse: 74 74  Resp: 17 16  Temp:  36.4 C  SpO2: 94% 94%    Last Pain:  Vitals:   05/03/23 1147  TempSrc:   PainSc: 0-No pain                 Anisha Starliper S

## 2023-05-06 DIAGNOSIS — N186 End stage renal disease: Secondary | ICD-10-CM | POA: Diagnosis not present

## 2023-05-06 DIAGNOSIS — N2581 Secondary hyperparathyroidism of renal origin: Secondary | ICD-10-CM | POA: Diagnosis not present

## 2023-05-06 DIAGNOSIS — Z992 Dependence on renal dialysis: Secondary | ICD-10-CM | POA: Diagnosis not present

## 2023-05-09 DIAGNOSIS — N2581 Secondary hyperparathyroidism of renal origin: Secondary | ICD-10-CM | POA: Diagnosis not present

## 2023-05-09 DIAGNOSIS — Z992 Dependence on renal dialysis: Secondary | ICD-10-CM | POA: Diagnosis not present

## 2023-05-09 DIAGNOSIS — N186 End stage renal disease: Secondary | ICD-10-CM | POA: Diagnosis not present

## 2023-05-10 DIAGNOSIS — M549 Dorsalgia, unspecified: Secondary | ICD-10-CM | POA: Diagnosis not present

## 2023-05-10 DIAGNOSIS — M542 Cervicalgia: Secondary | ICD-10-CM | POA: Diagnosis not present

## 2023-05-10 DIAGNOSIS — M25511 Pain in right shoulder: Secondary | ICD-10-CM | POA: Diagnosis not present

## 2023-05-10 DIAGNOSIS — M25569 Pain in unspecified knee: Secondary | ICD-10-CM | POA: Diagnosis not present

## 2023-05-10 DIAGNOSIS — M25512 Pain in left shoulder: Secondary | ICD-10-CM | POA: Diagnosis not present

## 2023-05-10 DIAGNOSIS — Z79891 Long term (current) use of opiate analgesic: Secondary | ICD-10-CM | POA: Diagnosis not present

## 2023-05-10 DIAGNOSIS — G894 Chronic pain syndrome: Secondary | ICD-10-CM | POA: Diagnosis not present

## 2023-05-11 DIAGNOSIS — Z992 Dependence on renal dialysis: Secondary | ICD-10-CM | POA: Diagnosis not present

## 2023-05-11 DIAGNOSIS — N2581 Secondary hyperparathyroidism of renal origin: Secondary | ICD-10-CM | POA: Diagnosis not present

## 2023-05-11 DIAGNOSIS — N186 End stage renal disease: Secondary | ICD-10-CM | POA: Diagnosis not present

## 2023-05-13 DIAGNOSIS — Z992 Dependence on renal dialysis: Secondary | ICD-10-CM | POA: Diagnosis not present

## 2023-05-13 DIAGNOSIS — N2581 Secondary hyperparathyroidism of renal origin: Secondary | ICD-10-CM | POA: Diagnosis not present

## 2023-05-13 DIAGNOSIS — N186 End stage renal disease: Secondary | ICD-10-CM | POA: Diagnosis not present

## 2023-05-16 DIAGNOSIS — N2581 Secondary hyperparathyroidism of renal origin: Secondary | ICD-10-CM | POA: Diagnosis not present

## 2023-05-16 DIAGNOSIS — M51369 Other intervertebral disc degeneration, lumbar region without mention of lumbar back pain or lower extremity pain: Secondary | ICD-10-CM | POA: Diagnosis not present

## 2023-05-16 DIAGNOSIS — M1712 Unilateral primary osteoarthritis, left knee: Secondary | ICD-10-CM | POA: Diagnosis not present

## 2023-05-16 DIAGNOSIS — N186 End stage renal disease: Secondary | ICD-10-CM | POA: Diagnosis not present

## 2023-05-16 DIAGNOSIS — M19012 Primary osteoarthritis, left shoulder: Secondary | ICD-10-CM | POA: Diagnosis not present

## 2023-05-16 DIAGNOSIS — Z992 Dependence on renal dialysis: Secondary | ICD-10-CM | POA: Diagnosis not present

## 2023-05-19 DIAGNOSIS — Z992 Dependence on renal dialysis: Secondary | ICD-10-CM | POA: Diagnosis not present

## 2023-05-19 DIAGNOSIS — N2581 Secondary hyperparathyroidism of renal origin: Secondary | ICD-10-CM | POA: Diagnosis not present

## 2023-05-19 DIAGNOSIS — N186 End stage renal disease: Secondary | ICD-10-CM | POA: Diagnosis not present

## 2023-05-20 DIAGNOSIS — N2581 Secondary hyperparathyroidism of renal origin: Secondary | ICD-10-CM | POA: Diagnosis not present

## 2023-05-20 DIAGNOSIS — Z992 Dependence on renal dialysis: Secondary | ICD-10-CM | POA: Diagnosis not present

## 2023-05-20 DIAGNOSIS — N186 End stage renal disease: Secondary | ICD-10-CM | POA: Diagnosis not present

## 2023-05-23 DIAGNOSIS — N186 End stage renal disease: Secondary | ICD-10-CM | POA: Diagnosis not present

## 2023-05-23 DIAGNOSIS — N2581 Secondary hyperparathyroidism of renal origin: Secondary | ICD-10-CM | POA: Diagnosis not present

## 2023-05-23 DIAGNOSIS — Z992 Dependence on renal dialysis: Secondary | ICD-10-CM | POA: Diagnosis not present

## 2023-05-24 DIAGNOSIS — N186 End stage renal disease: Secondary | ICD-10-CM | POA: Diagnosis not present

## 2023-05-24 DIAGNOSIS — Z992 Dependence on renal dialysis: Secondary | ICD-10-CM | POA: Diagnosis not present

## 2023-05-25 DIAGNOSIS — Z992 Dependence on renal dialysis: Secondary | ICD-10-CM | POA: Diagnosis not present

## 2023-05-25 DIAGNOSIS — N186 End stage renal disease: Secondary | ICD-10-CM | POA: Diagnosis not present

## 2023-05-25 DIAGNOSIS — N2581 Secondary hyperparathyroidism of renal origin: Secondary | ICD-10-CM | POA: Diagnosis not present

## 2023-05-27 DIAGNOSIS — N2581 Secondary hyperparathyroidism of renal origin: Secondary | ICD-10-CM | POA: Diagnosis not present

## 2023-05-27 DIAGNOSIS — Z992 Dependence on renal dialysis: Secondary | ICD-10-CM | POA: Diagnosis not present

## 2023-05-27 DIAGNOSIS — N186 End stage renal disease: Secondary | ICD-10-CM | POA: Diagnosis not present

## 2023-05-30 ENCOUNTER — Encounter (HOSPITAL_COMMUNITY): Payer: Self-pay

## 2023-05-30 ENCOUNTER — Other Ambulatory Visit: Payer: Self-pay

## 2023-05-30 ENCOUNTER — Emergency Department (HOSPITAL_COMMUNITY)
Admission: EM | Admit: 2023-05-30 | Discharge: 2023-05-30 | Disposition: A | Discharge status: Home / Self Care | Payer: 59 | Attending: Emergency Medicine | Admitting: Emergency Medicine

## 2023-05-30 DIAGNOSIS — I120 Hypertensive chronic kidney disease with stage 5 chronic kidney disease or end stage renal disease: Secondary | ICD-10-CM | POA: Diagnosis not present

## 2023-05-30 DIAGNOSIS — I132 Hypertensive heart and chronic kidney disease with heart failure and with stage 5 chronic kidney disease, or end stage renal disease: Secondary | ICD-10-CM | POA: Diagnosis not present

## 2023-05-30 DIAGNOSIS — J449 Chronic obstructive pulmonary disease, unspecified: Secondary | ICD-10-CM | POA: Insufficient documentation

## 2023-05-30 DIAGNOSIS — N2581 Secondary hyperparathyroidism of renal origin: Secondary | ICD-10-CM | POA: Diagnosis not present

## 2023-05-30 DIAGNOSIS — Z7951 Long term (current) use of inhaled steroids: Secondary | ICD-10-CM | POA: Insufficient documentation

## 2023-05-30 DIAGNOSIS — Z79899 Other long term (current) drug therapy: Secondary | ICD-10-CM | POA: Diagnosis not present

## 2023-05-30 DIAGNOSIS — I5030 Unspecified diastolic (congestive) heart failure: Secondary | ICD-10-CM | POA: Insufficient documentation

## 2023-05-30 DIAGNOSIS — Z7982 Long term (current) use of aspirin: Secondary | ICD-10-CM | POA: Diagnosis not present

## 2023-05-30 DIAGNOSIS — N186 End stage renal disease: Secondary | ICD-10-CM | POA: Diagnosis not present

## 2023-05-30 DIAGNOSIS — I471 Supraventricular tachycardia, unspecified: Secondary | ICD-10-CM | POA: Insufficient documentation

## 2023-05-30 DIAGNOSIS — Z992 Dependence on renal dialysis: Secondary | ICD-10-CM | POA: Insufficient documentation

## 2023-05-30 DIAGNOSIS — R000 Tachycardia, unspecified: Secondary | ICD-10-CM | POA: Diagnosis present

## 2023-05-30 LAB — BASIC METABOLIC PANEL
Anion gap: 16 — ABNORMAL HIGH (ref 5–15)
BUN: 62 mg/dL — ABNORMAL HIGH (ref 6–20)
CO2: 20 mmol/L — ABNORMAL LOW (ref 22–32)
Calcium: 9.1 mg/dL (ref 8.9–10.3)
Chloride: 99 mmol/L (ref 98–111)
Creatinine, Ser: 14.27 mg/dL — ABNORMAL HIGH (ref 0.61–1.24)
GFR, Estimated: 4 mL/min — ABNORMAL LOW (ref >60)
Glucose, Bld: 130 mg/dL — ABNORMAL HIGH (ref 70–99)
Potassium: 4.9 mmol/L (ref 3.5–5.1)
Sodium: 135 mmol/L (ref 135–145)

## 2023-05-30 LAB — CBC WITH DIFFERENTIAL/PLATELET
Abs Immature Granulocytes: 0.03 10*3/uL (ref 0.00–0.07)
Basophils Absolute: 0.1 10*3/uL (ref 0.0–0.1)
Basophils Relative: 1 %
Eosinophils Absolute: 0.2 10*3/uL (ref 0.0–0.5)
Eosinophils Relative: 2 %
HCT: 50.4 % (ref 39.0–52.0)
Hemoglobin: 15.5 g/dL (ref 13.0–17.0)
Immature Granulocytes: 0 %
Lymphocytes Relative: 18 %
Lymphs Abs: 1.7 10*3/uL (ref 0.7–4.0)
MCH: 29.4 pg (ref 26.0–34.0)
MCHC: 30.8 g/dL (ref 30.0–36.0)
MCV: 95.5 fL (ref 80.0–100.0)
Monocytes Absolute: 0.9 10*3/uL (ref 0.1–1.0)
Monocytes Relative: 9 %
Neutro Abs: 6.9 10*3/uL (ref 1.7–7.7)
Neutrophils Relative %: 70 %
Platelets: 287 10*3/uL (ref 150–400)
RBC: 5.28 MIL/uL (ref 4.22–5.81)
RDW: 15.9 % — ABNORMAL HIGH (ref 11.5–15.5)
WBC: 9.8 10*3/uL (ref 4.0–10.5)
nRBC: 0 % (ref 0.0–0.2)

## 2023-05-30 LAB — MAGNESIUM: Magnesium: 2.6 mg/dL — ABNORMAL HIGH (ref 1.7–2.4)

## 2023-05-30 MED ORDER — ADENOSINE 6 MG/2ML IV SOLN: 12.0000 mg | Freq: Once | INTRAVENOUS | Status: AC

## 2023-05-30 MED ORDER — ADENOSINE 6 MG/2ML IV SOLN
12.0000 mg | Freq: Once | INTRAVENOUS | Status: AC
  Administered 2023-05-30: 12 mg via INTRAVENOUS

## 2023-05-30 MED ORDER — METOPROLOL TARTRATE 50 MG PO TABS: 50.0000 mg | ORAL_TABLET | Freq: Two times a day (BID) | ORAL | 0 refills | Status: DC

## 2023-05-30 MED ORDER — ADENOSINE 6 MG/2ML IV SOLN: 6.0000 mg | Freq: Once | INTRAVENOUS | Status: AC

## 2023-05-30 MED ORDER — METOPROLOL TARTRATE 5 MG/5ML IV SOLN
5.0000 mg | Freq: Once | INTRAVENOUS | Status: AC
  Administered 2023-05-30: 5 mg via INTRAVENOUS
  Filled 2023-05-30: qty 5

## 2023-05-30 MED ORDER — ADENOSINE 6 MG/2ML IV SOLN
INTRAVENOUS | Status: AC
  Administered 2023-05-30: 6 mg via INTRAVENOUS
  Filled 2023-05-30: qty 6

## 2023-05-30 MED ORDER — DILTIAZEM LOAD VIA INFUSION
10.0000 mg | Freq: Once | INTRAVENOUS | Status: AC
  Administered 2023-05-30: 10 mg via INTRAVENOUS
  Filled 2023-05-30: qty 10

## 2023-05-30 MED ORDER — DILTIAZEM HCL-DEXTROSE 125-5 MG/125ML-% IV SOLN (PREMIX)
5.0000 mg/h | INTRAVENOUS | Status: DC
  Administered 2023-05-30: 5 mg/h via INTRAVENOUS
  Filled 2023-05-30: qty 125

## 2023-05-30 MED ORDER — ADENOSINE 6 MG/2ML IV SOLN
INTRAVENOUS | Status: AC
  Administered 2023-05-30: 12 mg via INTRAVENOUS
  Filled 2023-05-30: qty 4

## 2023-05-30 NOTE — ED Provider Notes (Signed)
 Physical Exam  BP 113/77   Pulse (!) 154   Temp 98.4 F (36.9 C) (Oral)   Resp 17   Ht 6' (1.829 m)   Wt 120 kg   SpO2 93%   BMI 35.88 kg/m   Physical Exam Constitutional:      General: He is not in acute distress.    Appearance: Normal appearance.  HENT:     Head: Normocephalic and atraumatic.     Nose: No congestion or rhinorrhea.  Eyes:     General:        Right eye: No discharge.        Left eye: No discharge.     Extraocular Movements: Extraocular movements intact.     Pupils: Pupils are equal, round, and reactive to light.  Cardiovascular:     Rate and Rhythm: Regular rhythm. Tachycardia present.     Heart sounds: No murmur heard. Pulmonary:     Effort: No respiratory distress.     Breath sounds: No wheezing or rales.  Abdominal:     General: There is no distension.     Tenderness: There is no abdominal tenderness.  Musculoskeletal:        General: Normal range of motion.     Cervical back: Normal range of motion.  Skin:    General: Skin is warm and dry.  Neurological:     General: No focal deficit present.     Mental Status: He is alert.     Procedures  .Critical Care  Performed by: Albertina Dixon, MD Authorized by: Albertina Dixon, MD   Critical care provider statement:    Critical care time (minutes):  30   Critical care was necessary to treat or prevent imminent or life-threatening deterioration of the following conditions:  Cardiac failure   Critical care was time spent personally by me on the following activities:  Development of treatment plan with patient or surrogate, discussions with consultants, evaluation of patient's response to treatment, examination of patient, ordering and review of laboratory studies, ordering and review of radiographic studies, ordering and performing treatments and interventions, pulse oximetry, re-evaluation of patient's condition and review of old charts   ED Course / MDM    Medical Decision Making Amount and/or  Complexity of Data Reviewed Labs: ordered.  Risk Prescription drug management.   Patient received in handoff.  Paroxysmal SVT versus a flutter 2-1 and need for dialysis.  No conversion with adenosine  or metoprolol  by previous provider.  Patient given 10 of diltiazem  and started on a diltiazem  drip.  I then placed the hospitalist admission.  However, patient quickly converted to normal sinus rhythm and after discussion with Dr. Ricky hospitalist and the cardiologist on-call Dr. Alvan, there is no longer an inpatient need for hospitalization and patient can be transitioned off of the drip and placed on 50 mg of metoprolol  twice daily.  I spoke with the nephrologist on-call Dr. Rayburn who states that patient is safe to get routine dialysis tomorrow and I spoke with the patient about this.  Patient states that his facility already has plans to see him tomorrow for routine dialysis.  I did ask the hospitalist to come evaluate the patient prior to discharge and he placed a note.  At this time patient does not meet inpatient criteria for admission as he has successfully converted to normal sinus rhythm.  He was given very strict return precautions of which she voiced understanding he was discharged with outpatient nephrology follow-up.  Albertina Dixon, MD 05/30/23 [phone]

## 2023-05-30 NOTE — ED Provider Notes (Signed)
 [place] EMERGENCY DEPARTMENT AT [facility] Provider Note   CSN: [id number] Arrival date & time: 05/30/23  9386     History  Chief Complaint  Patient presents with   Tachycardia    Brian Barrera is a 53 y.o. male.  The history is provided by the patient.  He has history of hypertension, diastolic heart failure, COPD, end-stage renal disease on hemodialysis, paroxysmal supraventricular tachycardia and was sent here from dialysis because he noted his heart rate was fast.  Patient was not aware of a rapid heartbeat and states that sometimes when he has this rhythm he is aware of it and sometimes he is not.  He denies chest pain, heaviness, tightness, pressure.  He denies any shortness of breath.   Home Medications Prior to Admission medications   Medication Sig Start Date End Date Taking? Authorizing Provider  albuterol  (ACCUNEB ) 1.25 MG/3ML nebulizer solution Take 1 ampule by nebulization every 4 (four) hours as needed for wheezing or shortness of breath.    [provider]  aspirin EC 81 MG tablet Take 81 mg by mouth in the morning.    [provider]  atorvastatin (LIPITOR) 10 MG tablet Take 10 mg by mouth every evening.    [provider]  AURYXIA 1 GM 210 MG(Fe) tablet Take 420 mg by mouth 3 (three) times daily with meals. 420 with snack 11/02/16   [provider]  cinacalcet  (SENSIPAR ) 60 MG tablet Take 60 mg by mouth every evening.    [provider]  diphenhydramine -acetaminophen  (TYLENOL  PM) 25-500 MG TABS tablet Take 1 tablet by mouth at bedtime as needed (sleep).    [provider]  DULoxetine (CYMBALTA) 60 MG capsule Take 60 mg by mouth in the morning. 11/08/19   [provider]  metoprolol  tartrate (LOPRESSOR ) 25 MG tablet Take 1.5 tablets (37.5 mg total) by mouth 2 (two) times daily. 07/08/22   Alvan Dorn FALCON, MD  midodrine (PROAMATINE) 5 MG tablet Take 5 mg by mouth every Monday, Wednesday, and  Friday. 11/07/22   [provider]  Multiple Vitamins-Minerals (MULTIVITAMIN WITH MINERALS) tablet Take 1 tablet by mouth daily. Men 50+    [provider]  Omega-3 1000 MG CAPS Take 2,000 mg by mouth daily.    [provider]  oxyCODONE -acetaminophen  (PERCOCET) 10-325 MG tablet Take 1 tablet by mouth every 4 (four) hours as needed for pain. 01/25/23   Bethanie Cough, PA-C  pregabalin (LYRICA) 75 MG capsule Take 75 mg by mouth 2 (two) times daily. 12/13/22   [provider]  sevelamer carbonate (RENVELA) 800 MG tablet Take 1,600-3,200 mg by mouth See admin instructions. Take 3200 mg with meals and 1600 with snack    [provider]  SYMBICORT  160-4.5 MCG/ACT inhaler Inhale 2 puffs into the lungs 2 (two) times daily. 09/28/22   [provider]      Allergies    Patient has no known allergies.    Review of Systems   Review of Systems  All other systems reviewed and are negative.   Physical Exam Updated Vital Signs BP (!) 115/99   Pulse (!) 154   Temp 98.4 F (36.9 C) (Oral)   Resp (!) 21   Ht 6' (1.829 m)   Wt 120 kg   SpO2 93%   BMI 35.88 kg/m  Physical Exam Vitals and nursing note reviewed.   53 year old male, resting comfortably and in no acute distress. Vital signs are significant for rapid  heart rate. Oxygen saturation is 93%, which is normal. Head is normocephalic and atraumatic. PERRLA, EOMI. Oropharynx is clear. Neck is nontender and supple without adenopathy or JVD. Back is nontender and there is no CVA tenderness. Lungs are clear without rales, wheezes, or rhonchi. Chest is nontender.  Dialysis access catheters are present on the right. Heart is tachycardic without murmur. Abdomen is soft, flat, nontender. Extremities have no cyanosis or edema, full range of motion is present.  AV fistula is present in the left antecubital area with thrill present. Skin is warm and dry without rash. Neurologic: Mental status is  normal, moves all extremities equally.  ED Results / Procedures / Treatments   Labs (all labs ordered are listed, but only abnormal results are displayed) Labs Reviewed  BASIC METABOLIC PANEL  CBC WITH DIFFERENTIAL/PLATELET    EKG EKG Interpretation Date/Time:  Monday May 30 2023 06:22:02 EST Ventricular Rate:  155 PR Interval:    QRS Duration:  94 QT Interval:  289 QTC Calculation: 465 R Axis:   -21  Text Interpretation: Supraventricular tachycardia Borderline left axis deviation When compared with ECG of 07/05/2022, Supraventricular tachycardia has replaced Sinus rhythm Confirmed by Raford Lenis ([id number]) on 05/30/2023 6:27:23 AM  Radiology No results found.  Procedures .Cardioversion  Date/Time: 05/30/2023 7:46 AM  Performed by: Raford Lenis, MD Authorized by: Raford Lenis, MD   Consent:    Consent obtained:  Verbal and emergent situation   Consent given by:  Patient   Risks discussed:  Induced arrhythmia, pain and death   Alternatives discussed:  Rate-control medication Universal protocol:    Procedure explained and questions answered to patient or proxy's satisfaction: yes     Relevant documents present and verified: yes     Required blood products, implants, devices, and special equipment available: yes     Site/side marked: yes     Immediately prior to procedure a time out was called: yes     Patient identity confirmed:  Verbally with patient and arm band Pre-procedure details:    Cardioversion basis:  Emergent   Rhythm:  Supraventricular tachycardia   Electrode placement:  Anterior-posterior Patient sedated: No Attempt one:    Cardioversion mode attempt one: adenosine  6 mg.   Shock outcome:  No change in rhythm Attempt two:    Cardioversion mode attempt two: adenosine  12 mg.   Shock outcome:  No change in rhythm Attempt three:    Cardioversion mode attempt three: adenosine  12 mg.   Shock outcome:  No change in rhythm Post-procedure details:    Patient  status:  Alert   Patient tolerance of procedure:  Tolerated well, no immediate complications   Cardiac monitor shows supraventricular tachycardia, per my interpretation.  Medications Ordered in ED Medications  diltiazem  (CARDIZEM ) 1 mg/mL load via infusion 10 mg (10 mg Intravenous Bolus from Bag 05/30/23 0740)    And  diltiazem  (CARDIZEM ) 125 mg in dextrose  5% 125 mL (1 mg/mL) infusion (5 mg/hr Intravenous New Bag/Given 05/30/23 0740)  adenosine  (ADENOCARD ) 6 MG/2ML injection 6 mg (6 mg Intravenous Given 05/30/23 0635)  adenosine  (ADENOCARD ) 6 MG/2ML injection 12 mg (12 mg Intravenous Given 05/30/23 0638)  adenosine  (ADENOCARD ) 6 MG/2ML injection 12 mg (12 mg Intravenous Given 05/30/23 0641)  metoprolol  tartrate (LOPRESSOR ) injection 5 mg (5 mg Intravenous Given 05/30/23 0654)    ED Course/ Medical Decision Making/ A&P  Medical Decision Making Amount and/or Complexity of Data Reviewed Labs: ordered.  Risk Prescription drug management.   Paroxysmal supraventricular tachycardia.  I reviewed his electrocardiogram, and my interpretation is PSVT which is new compared with ECG of 07/14/2022 following cardioversion, but unchanged compared with ECG of 07/05/2022 prior to cardioversion.  I have ordered basic labs of CBC and basic metabolic panel.  I have ordered a dose of adenosine .  Unfortunately, he had absolutely no response to 3 doses of adenosine .  I ordered a dose of metoprolol  for rate control.  Case is signed out to Dr. Albertina.  CRITICAL CARE Performed by: Alm Lias Total critical care time: 45 minutes Critical care time was exclusive of separately billable procedures and treating other patients. Critical care was necessary to treat or prevent imminent or life-threatening deterioration. Critical care was time spent personally by me on the following activities: development of treatment plan with patient and/or surrogate as well as nursing, discussions with  consultants, evaluation of patient's response to treatment, examination of patient, obtaining history from patient or surrogate, ordering and performing treatments and interventions, ordering and review of laboratory studies, ordering and review of radiographic studies, pulse oximetry and re-evaluation of patient's condition.  Final Clinical Impression(s) / ED Diagnoses Final diagnoses:  SVT (supraventricular tachycardia) (HCC)  End-stage renal disease on hemodialysis [facility])    Rx / DC Orders ED Discharge Orders     None         Lias Alm, MD 05/30/23 [phone]

## 2023-05-30 NOTE — ED Triage Notes (Signed)
 Pt arrived via POV from dialysis. Pt reports he couldn't start dialysis because they reported his HR was 150's and he needed to come to [facility] for evaluation. Pt is a MWF dialysis Pt and has not missed any appointments. Pt presents in NAD. Pt denies any CP, SOB. Pts left arm is restricted.

## 2023-05-30 NOTE — Progress Notes (Signed)
 Patient discussed with primary team, history of PSVT presented with SVT. Did not convert with adenosine , converted shortly after starting ditiazem drip. Would increase lopressor  to 50mg  bid, f/u Mg make sure optimized. No additional cardiolgy recs, no indication for admission from cardiac standpoint. We will arrange f/u  JINNY Ross MD

## 2023-05-30 NOTE — ED Notes (Signed)
 ED Provider at bedside.

## 2023-05-30 NOTE — Consult Note (Signed)
 Initial Consultation Note   Patient: Brian Barrera FMW:[id number] DOB: 01/04/1971 PCP: Lari Elspeth FORBES, MD DOA: 05/30/2023 DOS: the patient was seen and examined on 05/30/2023 Primary service: No att. providers found  Referring physician: Dr. Albertina  Reason for consult: SVT  Assessment/Plan: Assessment and Plan: 1-paroxysmal SVT -Patient with history of paroxysmal SVT; presented from dialysis unit with supraventricular tachycardia. -No chest pain, no shortness of breath, no nausea, no vomiting, no fever or any other complaints. -Electrolytes stable -Empirically treated with adenosine  x 2, 1 dose of metoprolol  and subsequently started on Cardizem  drip for less than 50 minutes prior to experiencing complete conversion back to sinus rhythm. -Case discussed with cardiology service with recommendations to increase metoprolol  to 50 mg twice a day and outpatient follow-up.  Patient will also continue daily baby aspirin as part of secondary prevention. -Patient hemodynamically stable and will be discharged home.  2-ESRD -Case discussed with nephrology service and the patient will be dialyzed at his dialysis unit on 05/31/2023 (especial date). -Further resumption of his schedule on Wednesday, 06/01/2023.  3-class II obesity -Body mass index is 35.88 kg/m. -Low-calorie diet, portion control and increase physical activity discussed with patient.  4-hyperlipidemia -Continue Lipitor.  5-secondary hyperparathyroidism -Continue treatment with Renvela and Sensipar .  6-idiopathic neuropathy -Continue treatment with Lyrica.  7-history of asthma -No exacerbation currently appreciated -Continue treatment with Symbicort  and as needed bronchodilators.  8-chronic diastolic heart failure -Continue volume management with dialysis treatments -Patient encouraged to follow low-sodium diet, to maintain adequate hydration and to continue checking weight on daily basis. -Compensated.    HPI: Brian Barrera is a 53 y.o. male with past medical history of asthma, ESRD, SVT, chronic diastolic heart failure, class II obesity, hyperlipidemia and idiopathic neuropathy; who presented to the hospital from dialysis center secondary to tachycardia.  Patient with history of paroxysmal SVT; otherwise asymptomatic and reporting not new medications or complaints. In the ED workup demonstrated stable electrolytes; patient adequately responded to treatment with adenosine , IV Lopressor  and Cardizem .  Case discussed with cardiology service who has recommended discharge home on adjusted dose of metoprolol  and outpatient follow-up..  Review of Systems: As mentioned in the history of present illness. All other systems reviewed and are negative. Past Medical History:  Diagnosis Date   Anemia    Attributed to chronic disease/chronic kidney disease   Asthma    CHF (congestive heart failure) (HCC)    Chronic diastolic heart failure (HCC) 05/24/2009   EF 35% by echo in 2011; echo in 08/2011-normal EF, moderate to severe LVH; BNP level of [phone] in 2011-12   Chronic obstructive pulmonary disease (HCC)    with asthmatic component   Dysrhythmia    ESRD (end stage renal disease) on dialysis (HCC)    secondary hyperparathyroidism   Hypertension    h/o hypertensive crisis with encephalopathy   Obesity    Tobacco abuse    Approximate consumption of 25 pack years; continuing at 0.5 pack per day   Past Surgical History:  Procedure Laterality Date   ARTERIOVENOUS GRAFT PLACEMENT  2011   AV FISTULA PLACEMENT Left 05/03/2023   Procedure: LEFT ARM BASILIC VEIN FISTULA CREATION;  Surgeon: Sheree Penne Bruckner, MD;  Location: [facility] OR;  Service: Vascular;  Laterality: Left;   COLONOSCOPY N/A 02/04/2014   Procedure: COLONOSCOPY;  Surgeon: Lamar CHRISTELLA Hollingshead, MD;  Location: [facility];  Service: Endoscopy;  Laterality: N/A;  12:00   COLONOSCOPY N/A 07/03/2019   Procedure: COLONOSCOPY;  Surgeon: Hollingshead Lamar CHRISTELLA,  MD;   Location: [facility];  Service: Endoscopy;  Laterality: N/A;  12:00-office rescheduled 2/9 @ 11:00am   INSERTION OF DIALYSIS CATHETER Right 03/01/2023   Procedure: INSERTION OF RIGHT INTERNAL JUGULAR TUNNELED DIALYSIS CATHETER;  Surgeon: Sheree Penne Bruckner, MD;  Location: [facility] OR;  Service: Vascular;  Laterality: Right;   IR FLUORO GUIDE CV LINE RIGHT  04/04/2023   POLYPECTOMY  07/03/2019   Procedure: POLYPECTOMY;  Surgeon: Shaaron Lamar HERO, MD;  Location: [facility];  Service: Endoscopy;;   REVISON OF ARTERIOVENOUS FISTULA Right 01/25/2023   Procedure: REVISION OF RIGHT ARM ARTERIOVENOUS FISTULA;  Surgeon: Sheree Penne Bruckner, MD;  Location: [facility] OR;  Service: Vascular;  Laterality: Right;   REVISON OF ARTERIOVENOUS FISTULA Right 03/01/2023   Procedure: INSERTION OF ARTERIOVENOUS GRAFT USING ARTEGRAFT  X 42CM;  Surgeon: Sheree Penne Bruckner, MD;  Location: [facility] OR;  Service: Vascular;  Laterality: Right;   THROMBECTOMY W/ EMBOLECTOMY  01/25/2023   Procedure: THROMBECTOMY RIGHT ARTERIOVENOUS FISTULA WITH BALLOON ANGIOPLASTY;  Surgeon: Sheree Penne Bruckner, MD;  Location: [facility] OR;  Service: Vascular;;   UPPER EXTREMITY VENOGRAPHY Bilateral 04/07/2023   Procedure: UPPER EXTREMITY VENOGRAPHY;  Surgeon: Gretta Bruckner PARAS, MD;  Location: [facility];  Service: Cardiovascular;  Laterality: Bilateral;   VENOGRAM Right 01/25/2023   Procedure: RIGHT ARM FISTULOGRAM;  Surgeon: Sheree Penne Bruckner, MD;  Location: [facility] OR;  Service: Vascular;  Laterality: Right;   Social History:  reports that he has been smoking cigarettes. He started smoking about 37 years ago. He has a 18.8 pack-year smoking history. He has never used smokeless tobacco. He reports that he does not drink alcohol  and does not use drugs.  No Known Allergies  Family History  Problem Relation Age of Onset   Heart disease Father    Diabetes Sister    Hypertension Sister    Diabetes Sister    Hypertension  Sister    Kidney disease Mother    Hypertension Mother    Hypertension Sister    Hypertension Brother    Hypertension Brother    Hypertension Brother    Colon cancer Neg Hx     Prior to Admission medications   Medication Sig Start Date End Date Taking? Authorizing Provider  albuterol  (ACCUNEB ) 1.25 MG/3ML nebulizer solution Take 1 ampule by nebulization every 4 (four) hours as needed for wheezing or shortness of breath.    [provider]  aspirin EC 81 MG tablet Take 81 mg by mouth in the morning.    [provider]  atorvastatin (LIPITOR) 10 MG tablet Take 10 mg by mouth every evening.    [provider]  AURYXIA 1 GM 210 MG(Fe) tablet Take 420 mg by mouth 3 (three) times daily with meals. 420 with snack 11/02/16   [provider]  cinacalcet  (SENSIPAR ) 60 MG tablet Take 60 mg by mouth every evening.    [provider]  diphenhydramine -acetaminophen  (TYLENOL  PM) 25-500 MG TABS tablet Take 1 tablet by mouth at bedtime as needed (sleep).    [provider]  DULoxetine (CYMBALTA) 60 MG capsule Take 60 mg by mouth in the morning. 11/08/19   [provider]  metoprolol  tartrate (LOPRESSOR ) 50 MG tablet Take 1 tablet (50 mg total) by mouth 2 (two) times daily. 05/30/23 06/29/23  Kommor, Lum, MD  midodrine (PROAMATINE) 5 MG tablet Take 5 mg by mouth every Monday, Wednesday, and Friday. 11/07/22   [provider]  Multiple Vitamins-Minerals (MULTIVITAMIN WITH MINERALS) tablet Take  1 tablet by mouth daily. Men 50+    [provider]  Omega-3 1000 MG CAPS Take 2,000 mg by mouth daily.    [provider]  oxyCODONE -acetaminophen  (PERCOCET) 10-325 MG tablet Take 1 tablet by mouth every 4 (four) hours as needed for pain. 01/25/23   Bethanie Cough, PA-C  pregabalin (LYRICA) 75 MG capsule Take 75 mg by mouth 2 (two) times daily. 12/13/22   [provider]  sevelamer carbonate (RENVELA) 800 MG tablet Take  1,600-3,200 mg by mouth See admin instructions. Take 3200 mg with meals and 1600 with snack    [provider]  SYMBICORT  160-4.5 MCG/ACT inhaler Inhale 2 puffs into the lungs 2 (two) times daily. 09/28/22   [provider]    Physical Exam: Vitals:   05/30/23 0815 05/30/23 0830 05/30/23 0835 05/30/23 0845  BP: 120/75 122/82 128/78 116/74  Pulse:      Resp: 17 (!) 22 17 20   Temp:      TempSrc:      SpO2:      Weight:      Height:       General exam: Alert, awake, oriented x 3 Respiratory system: Clear to auscultation. Respiratory effort normal.  Saturation on room air. Cardiovascular system:RRR at time of evaluation; no rubs or gallops. Gastrointestinal system: Abdomen is obese, nondistended, soft and nontender. No organomegaly or masses felt. Normal bowel sounds heard. Central nervous system:  No focal neurological deficits. Extremities: No cyanosis or clubbing. Skin: No petechiae. Psychiatry: Judgement and insight appear normal. Mood & affect appropriate.   Data Reviewed:  Magnesium: 2.6 CBC: WBCs 9.8, hemoglobin 15.5 and platelet count 287K Basic metabolic panel: Sodium 135, potassium 4.9, chloride 99, bicarb 20, BUN 62, creatinine 14 and GFR 4.  Patient's calcium level 9.1  Family Communication: No family at bedside. Primary team communication: Patient hemodynamically stable to go home and follow-up with cardiology as an outpatient and resumption of dialysis as planned/arrange by his dialysis unit and per nephrology recommendations.   Thank you very much for involving us  in the care of your patient.  Author: Eric Nunnery, MD 05/30/2023 10:08 AM  For on call review [email or web address].

## 2023-06-01 DIAGNOSIS — N2581 Secondary hyperparathyroidism of renal origin: Secondary | ICD-10-CM | POA: Diagnosis not present

## 2023-06-01 DIAGNOSIS — Z992 Dependence on renal dialysis: Secondary | ICD-10-CM | POA: Diagnosis not present

## 2023-06-01 DIAGNOSIS — N186 End stage renal disease: Secondary | ICD-10-CM | POA: Diagnosis not present

## 2023-06-03 DIAGNOSIS — N2581 Secondary hyperparathyroidism of renal origin: Secondary | ICD-10-CM | POA: Diagnosis not present

## 2023-06-03 DIAGNOSIS — Z992 Dependence on renal dialysis: Secondary | ICD-10-CM | POA: Diagnosis not present

## 2023-06-03 DIAGNOSIS — N186 End stage renal disease: Secondary | ICD-10-CM | POA: Diagnosis not present

## 2023-06-06 DIAGNOSIS — N186 End stage renal disease: Secondary | ICD-10-CM | POA: Diagnosis not present

## 2023-06-06 DIAGNOSIS — Z992 Dependence on renal dialysis: Secondary | ICD-10-CM | POA: Diagnosis not present

## 2023-06-06 DIAGNOSIS — N2581 Secondary hyperparathyroidism of renal origin: Secondary | ICD-10-CM | POA: Diagnosis not present

## 2023-06-08 DIAGNOSIS — N186 End stage renal disease: Secondary | ICD-10-CM | POA: Diagnosis not present

## 2023-06-08 DIAGNOSIS — Z992 Dependence on renal dialysis: Secondary | ICD-10-CM | POA: Diagnosis not present

## 2023-06-08 DIAGNOSIS — N2581 Secondary hyperparathyroidism of renal origin: Secondary | ICD-10-CM | POA: Diagnosis not present

## 2023-06-09 ENCOUNTER — Other Ambulatory Visit: Payer: Self-pay

## 2023-06-09 DIAGNOSIS — N186 End stage renal disease: Secondary | ICD-10-CM

## 2023-06-10 DIAGNOSIS — N186 End stage renal disease: Secondary | ICD-10-CM | POA: Diagnosis not present

## 2023-06-10 DIAGNOSIS — N2581 Secondary hyperparathyroidism of renal origin: Secondary | ICD-10-CM | POA: Diagnosis not present

## 2023-06-10 DIAGNOSIS — Z992 Dependence on renal dialysis: Secondary | ICD-10-CM | POA: Diagnosis not present

## 2023-06-13 DIAGNOSIS — N2581 Secondary hyperparathyroidism of renal origin: Secondary | ICD-10-CM | POA: Diagnosis not present

## 2023-06-13 DIAGNOSIS — Z992 Dependence on renal dialysis: Secondary | ICD-10-CM | POA: Diagnosis not present

## 2023-06-13 DIAGNOSIS — N186 End stage renal disease: Secondary | ICD-10-CM | POA: Diagnosis not present

## 2023-06-15 ENCOUNTER — Ambulatory Visit (HOSPITAL_COMMUNITY)
Admission: RE | Admit: 2023-06-15 | Discharge: 2023-06-15 | Disposition: A | Discharge status: Home / Self Care | Payer: 59 | Source: Ambulatory Visit | Attending: Vascular Surgery | Admitting: Vascular Surgery

## 2023-06-15 ENCOUNTER — Ambulatory Visit (INDEPENDENT_AMBULATORY_CARE_PROVIDER_SITE_OTHER): Payer: 59 | Admitting: Physician Assistant

## 2023-06-15 VITALS — BP 99/68 | HR 85 | Temp 97.6°F | Resp 18 | Ht 72.0 in | Wt 273.9 lb

## 2023-06-15 DIAGNOSIS — N186 End stage renal disease: Secondary | ICD-10-CM | POA: Insufficient documentation

## 2023-06-15 DIAGNOSIS — T82510S Breakdown (mechanical) of surgically created arteriovenous fistula, sequela: Secondary | ICD-10-CM

## 2023-06-15 DIAGNOSIS — Z992 Dependence on renal dialysis: Secondary | ICD-10-CM | POA: Diagnosis not present

## 2023-06-15 DIAGNOSIS — N2581 Secondary hyperparathyroidism of renal origin: Secondary | ICD-10-CM | POA: Diagnosis not present

## 2023-06-15 NOTE — H&P (View-Only) (Signed)
 Post Op Dialysis Access   History of Present Illness   Brian Barrera is a 53 y.o. (22-Feb-1971) male who presents for post op follow up after left upper arm brachial artery to basilic vein AV fistula creation on 05/03/23 by Dr. Randie Heinz. Today he reports a little tenderness along the forearm but otherwise denies any pain, weakness or numbness in the arm or hand. He currently is dialyzing on MWF at the [facility] location via  right internal jugular TDC. He has an on occluded left forearm loop graft as well as a recently occluded right radiocephalic AV fistula to interposition Artegraft. He continues to have some right forearm pain at the site of his previous pseudoaneurysm and is still hoping to have this repaired.   Current Outpatient Medications  Medication Sig Dispense Refill   albuterol (ACCUNEB) 1.25 MG/3ML nebulizer solution Take 1 ampule by nebulization every 4 (four) hours as needed for wheezing or shortness of breath.     aspirin EC 81 MG tablet Take 81 mg by mouth in the morning.     atorvastatin (LIPITOR) 10 MG tablet Take 10 mg by mouth every evening.     AURYXIA 1 GM 210 MG(Fe) tablet Take 420 mg by mouth 3 (three) times daily with meals. 420 with snack     cinacalcet (SENSIPAR) 60 MG tablet Take 60 mg by mouth every evening.     diphenhydramine-acetaminophen (TYLENOL PM) 25-500 MG TABS tablet Take 1 tablet by mouth at bedtime as needed (sleep).     DULoxetine (CYMBALTA) 60 MG capsule Take 60 mg by mouth in the morning.     metoprolol tartrate (LOPRESSOR) 50 MG tablet Take 1 tablet (50 mg total) by mouth 2 (two) times daily. 60 tablet 0   midodrine (PROAMATINE) 5 MG tablet Take 5 mg by mouth every Monday, Wednesday, and Friday.     Multiple Vitamins-Minerals (MULTIVITAMIN WITH MINERALS) tablet Take 1 tablet by mouth daily. Men 50+     Omega-3 1000 MG CAPS Take 2,000 mg by mouth daily.     oxyCODONE-acetaminophen (PERCOCET) 10-325 MG tablet Take 1 tablet by mouth every 4 (four)  hours as needed for pain. 30 tablet 0   pregabalin (LYRICA) 75 MG capsule Take 75 mg by mouth 2 (two) times daily.     sevelamer carbonate (RENVELA) 800 MG tablet Take 1,600-3,200 mg by mouth See admin instructions. Take 3200 mg with meals and 1600 with snack     SYMBICORT 160-4.5 MCG/ACT inhaler Inhale 2 puffs into the lungs 2 (two) times daily.     No current facility-administered medications for this visit.    On ROS today: negative unless stated in HPI   Physical Examination   Vitals:   06/15/23 1336  BP: 99/68  Pulse: 85  Resp: 18  Temp: 97.6 F (36.4 C)  TempSrc: Temporal  SpO2: 94%  Weight: 273 lb 14.4 oz (124.2 kg)  Height: 6' (1.829 m)   Body mass index is 37.15 kg/m.  General Well appearing, well nourished in no distress  Pulmonary Non labored  Cardiac Regular rate and rhythm  Vascular 2+ left radial pulse. Left hand warm and well perfused. Left upper arm fistula with good thrill. Incision intact and well healed. Right radial pulse 2+. 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   Musculo- skeletal M/S 5/5 throughout  , Extremities without ischemic changes    Neurologic A&O; CN grossly intact     Non-invasive Vascular Imaging   left Arm  Access Duplex  (06/15/23):  Findings:  +--------------------+----------+-----------------+--------+  AVF                PSV (cm/s)Flow Vol (mL/min)Comments  +--------------------+----------+-----------------+--------+  Native artery inflow   252          1204                 +--------------------+----------+-----------------+--------+  AVF Anastomosis        656                               +--------------------+----------+-----------------+--------+   +------------+----------+-------------+----------+--------+  OUTFLOW VEINPSV (cm/s)Diameter (cm)Depth (cm)Describe  +------------+----------+-------------+----------+--------+  Shoulder      107        0.74        2.60              +------------+----------+-------------+----------+--------+  Prox UA        127        0.76        1.80             +------------+----------+-------------+----------+--------+  Mid UA         170        0.62        1.60             +------------+----------+-------------+----------+--------+  Dist UA        291        0.64        1.20             +------------+----------+-------------+----------+--------+  AC Fossa       755        0.37        1.40             +------------+----------+-------------+----------+--------+   Summary:  - Patent left arm brachial-basilic arterio-venous fistula.  - Flow volume: 1204 mL/min.  - Elevated velocities at the [facility] fossa, suggestive of extrinsic compression via hematoma/swelling.      Medical Decision Making   Brian Barrera is a 52 y.o. male who presents for post op follow up after left upper arm brachial artery to basilic vein AV fistula creation on 05/03/23 by Dr. Randie Heinz. He is without any signs of symptoms of steal. The incision has healed very nicely. He has great thrill. Duplex today shows patent fistula that has matured nicely with adequate volume flow. The fistula however is too deep in the left upper arm. Discussed the need for transposition of the fistula in the OR. He would also like to have the old right pseudoaneurysm removed at the same time. He does not take any blood thinners. Will schedule him for left upper extremity 2nd stage basilic vein transposition and excision of right forearm pseudoaneurysm with Dr. Randie Heinz in the near future. He dialyses on MWF so will try to arrange this on a non dialysis day.    Graceann Congress, PA-C [facility] Office: [phone]  Clinic MD: Randie Heinz

## 2023-06-15 NOTE — Progress Notes (Signed)
Post Op Dialysis Access   History of Present Illness   Brian Barrera is a 53 y.o. (22-Feb-1971) male who presents for post op follow up after left upper arm brachial artery to basilic vein AV fistula creation on 05/03/23 by Dr. Randie Heinz. Today he reports a little tenderness along the forearm but otherwise denies any pain, weakness or numbness in the arm or hand. He currently is dialyzing on MWF at the [facility] location via  right internal jugular TDC. He has an on occluded left forearm loop graft as well as a recently occluded right radiocephalic AV fistula to interposition Artegraft. He continues to have some right forearm pain at the site of his previous pseudoaneurysm and is still hoping to have this repaired.   Current Outpatient Medications  Medication Sig Dispense Refill   albuterol (ACCUNEB) 1.25 MG/3ML nebulizer solution Take 1 ampule by nebulization every 4 (four) hours as needed for wheezing or shortness of breath.     aspirin EC 81 MG tablet Take 81 mg by mouth in the morning.     atorvastatin (LIPITOR) 10 MG tablet Take 10 mg by mouth every evening.     AURYXIA 1 GM 210 MG(Fe) tablet Take 420 mg by mouth 3 (three) times daily with meals. 420 with snack     cinacalcet (SENSIPAR) 60 MG tablet Take 60 mg by mouth every evening.     diphenhydramine-acetaminophen (TYLENOL PM) 25-500 MG TABS tablet Take 1 tablet by mouth at bedtime as needed (sleep).     DULoxetine (CYMBALTA) 60 MG capsule Take 60 mg by mouth in the morning.     metoprolol tartrate (LOPRESSOR) 50 MG tablet Take 1 tablet (50 mg total) by mouth 2 (two) times daily. 60 tablet 0   midodrine (PROAMATINE) 5 MG tablet Take 5 mg by mouth every Monday, Wednesday, and Friday.     Multiple Vitamins-Minerals (MULTIVITAMIN WITH MINERALS) tablet Take 1 tablet by mouth daily. Men 50+     Omega-3 1000 MG CAPS Take 2,000 mg by mouth daily.     oxyCODONE-acetaminophen (PERCOCET) 10-325 MG tablet Take 1 tablet by mouth every 4 (four)  hours as needed for pain. 30 tablet 0   pregabalin (LYRICA) 75 MG capsule Take 75 mg by mouth 2 (two) times daily.     sevelamer carbonate (RENVELA) 800 MG tablet Take 1,600-3,200 mg by mouth See admin instructions. Take 3200 mg with meals and 1600 with snack     SYMBICORT 160-4.5 MCG/ACT inhaler Inhale 2 puffs into the lungs 2 (two) times daily.     No current facility-administered medications for this visit.    On ROS today: negative unless stated in HPI   Physical Examination   Vitals:   06/15/23 1336  BP: 99/68  Pulse: 85  Resp: 18  Temp: 97.6 F (36.4 C)  TempSrc: Temporal  SpO2: 94%  Weight: 273 lb 14.4 oz (124.2 kg)  Height: 6' (1.829 m)   Body mass index is 37.15 kg/m.  General Well appearing, well nourished in no distress  Pulmonary Non labored  Cardiac Regular rate and rhythm  Vascular 2+ left radial pulse. Left hand warm and well perfused. Left upper arm fistula with good thrill. Incision intact and well healed. Right radial pulse 2+. 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   Musculo- skeletal M/S 5/5 throughout  , Extremities without ischemic changes    Neurologic A&O; CN grossly intact     Non-invasive Vascular Imaging   left Arm  Access Duplex  (06/15/23):  Findings:  +--------------------+----------+-----------------+--------+  AVF                PSV (cm/s)Flow Vol (mL/min)Comments  +--------------------+----------+-----------------+--------+  Native artery inflow   252          1204                 +--------------------+----------+-----------------+--------+  AVF Anastomosis        656                               +--------------------+----------+-----------------+--------+   +------------+----------+-------------+----------+--------+  OUTFLOW VEINPSV (cm/s)Diameter (cm)Depth (cm)Describe  +------------+----------+-------------+----------+--------+  Shoulder      107        0.74        2.60              +------------+----------+-------------+----------+--------+  Prox UA        127        0.76        1.80             +------------+----------+-------------+----------+--------+  Mid UA         170        0.62        1.60             +------------+----------+-------------+----------+--------+  Dist UA        291        0.64        1.20             +------------+----------+-------------+----------+--------+  AC Fossa       755        0.37        1.40             +------------+----------+-------------+----------+--------+   Summary:  - Patent left arm brachial-basilic arterio-venous fistula.  - Flow volume: 1204 mL/min.  - Elevated velocities at the [facility] fossa, suggestive of extrinsic compression via hematoma/swelling.      Medical Decision Making   [name] is a 52 y.o. male who presents for post op follow up after left upper arm brachial artery to basilic vein AV fistula creation on 05/03/23 by Dr. Randie Heinz. He is without any signs of symptoms of steal. The incision has healed very nicely. He has great thrill. Duplex today shows patent fistula that has matured nicely with adequate volume flow. The fistula however is too deep in the left upper arm. Discussed the need for transposition of the fistula in the OR. He would also like to have the old right pseudoaneurysm removed at the same time. He does not take any blood thinners. Will schedule him for left upper extremity 2nd stage basilic vein transposition and excision of right forearm pseudoaneurysm with Dr. Randie Heinz in the near future. He dialyses on MWF so will try to arrange this on a non dialysis day.    Graceann Congress, PA-C [facility] Office: [phone]  Clinic MD: Randie Heinz

## 2023-06-16 ENCOUNTER — Other Ambulatory Visit: Payer: Self-pay | Admitting: *Deleted

## 2023-06-16 DIAGNOSIS — N186 End stage renal disease: Secondary | ICD-10-CM

## 2023-06-17 DIAGNOSIS — Z992 Dependence on renal dialysis: Secondary | ICD-10-CM | POA: Diagnosis not present

## 2023-06-17 DIAGNOSIS — N186 End stage renal disease: Secondary | ICD-10-CM | POA: Diagnosis not present

## 2023-06-17 DIAGNOSIS — N2581 Secondary hyperparathyroidism of renal origin: Secondary | ICD-10-CM | POA: Diagnosis not present

## 2023-06-20 ENCOUNTER — Emergency Department (HOSPITAL_COMMUNITY)
Admission: EM | Admit: 2023-06-20 | Discharge: 2023-06-20 | Disposition: A | Discharge status: Home / Self Care | Payer: 59 | Attending: Emergency Medicine | Admitting: Emergency Medicine

## 2023-06-20 ENCOUNTER — Encounter (HOSPITAL_COMMUNITY): Payer: Self-pay

## 2023-06-20 ENCOUNTER — Emergency Department (HOSPITAL_COMMUNITY): Payer: 59

## 2023-06-20 ENCOUNTER — Other Ambulatory Visit: Payer: Self-pay

## 2023-06-20 DIAGNOSIS — Z992 Dependence on renal dialysis: Secondary | ICD-10-CM | POA: Diagnosis not present

## 2023-06-20 DIAGNOSIS — F17200 Nicotine dependence, unspecified, uncomplicated: Secondary | ICD-10-CM | POA: Diagnosis not present

## 2023-06-20 DIAGNOSIS — R0602 Shortness of breath: Secondary | ICD-10-CM | POA: Diagnosis not present

## 2023-06-20 DIAGNOSIS — N2581 Secondary hyperparathyroidism of renal origin: Secondary | ICD-10-CM | POA: Diagnosis not present

## 2023-06-20 DIAGNOSIS — Z79899 Other long term (current) drug therapy: Secondary | ICD-10-CM | POA: Insufficient documentation

## 2023-06-20 DIAGNOSIS — I471 Supraventricular tachycardia, unspecified: Secondary | ICD-10-CM | POA: Diagnosis not present

## 2023-06-20 DIAGNOSIS — I120 Hypertensive chronic kidney disease with stage 5 chronic kidney disease or end stage renal disease: Secondary | ICD-10-CM | POA: Diagnosis not present

## 2023-06-20 DIAGNOSIS — J45909 Unspecified asthma, uncomplicated: Secondary | ICD-10-CM | POA: Insufficient documentation

## 2023-06-20 DIAGNOSIS — N186 End stage renal disease: Secondary | ICD-10-CM | POA: Diagnosis not present

## 2023-06-20 DIAGNOSIS — Z7982 Long term (current) use of aspirin: Secondary | ICD-10-CM | POA: Diagnosis not present

## 2023-06-20 DIAGNOSIS — R0989 Other specified symptoms and signs involving the circulatory and respiratory systems: Secondary | ICD-10-CM | POA: Diagnosis not present

## 2023-06-20 DIAGNOSIS — J449 Chronic obstructive pulmonary disease, unspecified: Secondary | ICD-10-CM | POA: Diagnosis not present

## 2023-06-20 DIAGNOSIS — R002 Palpitations: Secondary | ICD-10-CM | POA: Diagnosis present

## 2023-06-20 DIAGNOSIS — I5032 Chronic diastolic (congestive) heart failure: Secondary | ICD-10-CM | POA: Diagnosis not present

## 2023-06-20 DIAGNOSIS — I132 Hypertensive heart and chronic kidney disease with heart failure and with stage 5 chronic kidney disease, or end stage renal disease: Secondary | ICD-10-CM | POA: Diagnosis not present

## 2023-06-20 LAB — CBC
HCT: 51.6 % (ref 39.0–52.0)
Hemoglobin: 15.2 g/dL (ref 13.0–17.0)
MCH: 29 pg (ref 26.0–34.0)
MCHC: 29.5 g/dL — ABNORMAL LOW (ref 30.0–36.0)
MCV: 98.3 fL (ref 80.0–100.0)
Platelets: 171 10*3/uL (ref 150–400)
RBC: 5.25 MIL/uL (ref 4.22–5.81)
RDW: 17.5 % — ABNORMAL HIGH (ref 11.5–15.5)
WBC: 7.6 10*3/uL (ref 4.0–10.5)
nRBC: 0 % (ref 0.0–0.2)

## 2023-06-20 LAB — BASIC METABOLIC PANEL
Anion gap: 14 (ref 5–15)
BUN: 64 mg/dL — ABNORMAL HIGH (ref 6–20)
CO2: 23 mmol/L (ref 22–32)
Calcium: 9.1 mg/dL (ref 8.9–10.3)
Chloride: 100 mmol/L (ref 98–111)
Creatinine, Ser: 16.26 mg/dL — ABNORMAL HIGH (ref 0.61–1.24)
GFR, Estimated: 3 mL/min — ABNORMAL LOW (ref >60)
Glucose, Bld: 86 mg/dL (ref 70–99)
Potassium: 5.3 mmol/L — ABNORMAL HIGH (ref 3.5–5.1)
Sodium: 137 mmol/L (ref 135–145)

## 2023-06-20 LAB — MAGNESIUM: Magnesium: 2.8 mg/dL — ABNORMAL HIGH (ref 1.7–2.4)

## 2023-06-20 MED ORDER — ADENOSINE 6 MG/2ML IV SOLN
6.0000 mg | Freq: Once | INTRAVENOUS | Status: AC
  Administered 2023-06-20: 6 mg via INTRAVENOUS
  Filled 2023-06-20: qty 2

## 2023-06-20 MED ORDER — ADENOSINE 6 MG/2ML IV SOLN
INTRAVENOUS | Status: AC
  Filled 2023-06-20: qty 4

## 2023-06-20 MED ORDER — ADENOSINE 6 MG/2ML IV SOLN
12.0000 mg | Freq: Once | INTRAVENOUS | Status: DC
Start: 2023-06-20 — End: 2023-06-20

## 2023-06-20 NOTE — ED Triage Notes (Signed)
Pt came in today with c/o palpitations. Pt states this has been going on for a couple years. Pt states he was just here a couple weeks ago for the same thing. Pt is a dialysis pt, last treatment was Friday. Pt went to dialysis this morning and they told him he needed to come her for increased heart rate. Pt did not receive treatment today. Pt states he could not feel his heart racing earlier but at this time he can feel it a little bit. Pt takes medicine for it, and has took it today.

## 2023-06-20 NOTE — ED Notes (Signed)
Adenosine given to pt, with EDP, this nurse, charge nurse, and RT. Adenosine effective at this time.

## 2023-06-20 NOTE — Discharge Instructions (Addendum)
Get your dialysis done today.  Follow-up with cardiology.

## 2023-06-20 NOTE — ED Provider Notes (Signed)
EMERGENCY DEPARTMENT AT [facility] Provider Note   CSN: [id number] Arrival date & time: 06/20/23  4098     History  Chief Complaint  Patient presents with   Palpitations    Brian Barrera is a 53 y.o. male.   Palpitations Patient history of SVT.  Multiple episodes for same.  On metoprolol.  Felt heart racing this morning.  Due for dialysis today.  Last dialyzed on Friday.  Went to dialysis morning and was told to come in here.  Patient states that he does feel as if he has some extra fluid onHim.  Is on metoprolol for his SVT.    Past Medical History:  Diagnosis Date   Anemia    Attributed to chronic disease/chronic kidney disease   Asthma    CHF (congestive heart failure) (HCC)    Chronic diastolic heart failure (HCC) 05/24/2009   EF 35% by echo in 2011; echo in 08/2011-normal EF, moderate to severe LVH; BNP level of [phone] in 2011-12   Chronic obstructive pulmonary disease (HCC)    with asthmatic component   Dysrhythmia    ESRD (end stage renal disease) on dialysis (HCC)    secondary hyperparathyroidism   Hypertension    h/o hypertensive crisis with encephalopathy   Obesity    Tobacco abuse    Approximate consumption of 25 pack years; continuing at 0.5 pack per day    Home Medications Prior to Admission medications   Medication Sig Start Date End Date Taking? Authorizing Provider  albuterol (ACCUNEB) 1.25 MG/3ML nebulizer solution Take 1 ampule by nebulization every 4 (four) hours as needed for wheezing or shortness of breath.    [provider]  aspirin EC 81 MG tablet Take 81 mg by mouth in the morning.    [provider]  atorvastatin (LIPITOR) 10 MG tablet Take 10 mg by mouth every evening.    [provider]  AURYXIA 1 GM 210 MG(Fe) tablet Take 420 mg by mouth 3 (three) times daily with meals. 420 with snack 11/02/16   [provider]  cinacalcet (SENSIPAR) 60 MG tablet Take 60 mg by mouth every  evening.    [provider]  diphenhydramine-acetaminophen (TYLENOL PM) 25-500 MG TABS tablet Take 1 tablet by mouth at bedtime as needed (sleep).    [provider]  DULoxetine (CYMBALTA) 60 MG capsule Take 60 mg by mouth in the morning. 11/08/19   [provider]  metoprolol tartrate (LOPRESSOR) 50 MG tablet Take 1 tablet (50 mg total) by mouth 2 (two) times daily. 05/30/23 06/29/23  Kommor, Wyn Forster, MD  midodrine (PROAMATINE) 5 MG tablet Take 5 mg by mouth every Monday, Wednesday, and Friday. 11/07/22   [provider]  Multiple Vitamins-Minerals (MULTIVITAMIN WITH MINERALS) tablet Take 1 tablet by mouth daily. Men 50+    [provider]  Omega-3 1000 MG CAPS Take 2,000 mg by mouth daily.    [provider]  oxyCODONE-acetaminophen (PERCOCET) 10-325 MG tablet Take 1 tablet by mouth every 4 (four) hours as needed for pain. 01/25/23   Emilie Rutter, PA-C  pregabalin (LYRICA) 75 MG capsule Take 75 mg by mouth 2 (two) times daily. 12/13/22   [provider]  sevelamer carbonate (RENVELA) 800 MG tablet Take 1,600-3,200 mg by mouth See admin instructions. Take 3200 mg with meals and 1600 with snack    [provider]  SYMBICORT 160-4.5 MCG/ACT inhaler Inhale 2 puffs into the lungs 2 (two) times daily. 09/28/22  [provider]      Allergies    Patient has no known allergies.    Review of Systems   Review of Systems  Cardiovascular:  Positive for palpitations.    Physical Exam Updated Vital Signs BP 105/67   Pulse (!) 149   Temp (!) 97.5 F (36.4 C) (Oral)   Resp 19   Ht 6' (1.829 m)   Wt 124.2 kg   SpO2 96%   BMI 37.14 kg/m  Physical Exam Vitals and nursing note reviewed.  Cardiovascular:     Rate and Rhythm: Regular rhythm. Tachycardia present.  Pulmonary:     Breath sounds: No rales.  Abdominal:     Tenderness: There is no abdominal tenderness.  Musculoskeletal:     Right lower leg: Edema present.      Left lower leg: Edema present.  Neurological:     Mental Status: He is alert. Mental status is at baseline.     ED Results / Procedures / Treatments   Labs (all labs ordered are listed, but only abnormal results are displayed) Labs Reviewed  BASIC METABOLIC PANEL - Abnormal; Notable for the following components:      Result Value   Potassium 5.3 (*)    BUN 64 (*)    Creatinine, Ser 16.26 (*)    GFR, Estimated 3 (*)    All other components within normal limits  CBC - Abnormal; Notable for the following components:   MCHC 29.5 (*)    RDW 17.5 (*)    All other components within normal limits  MAGNESIUM - Abnormal; Notable for the following components:   Magnesium 2.8 (*)    All other components within normal limits    EKG EKG Interpretation Date/Time:  Monday June 20 2023 08:27:43 EST Ventricular Rate:  86 PR Interval:  182 QRS Duration:  87 QT Interval:  363 QTC Calculation: 435 R Axis:   1  Text Interpretation: Sinus rhythm Left atrial enlargement Low voltage, precordial leads Abnormal R-wave progression, early transition SVT resolved Confirmed by Benjiman Core [phone]) on 06/20/2023 9:07:08 AM  Radiology DG Chest Portable 1 View Result Date: 06/20/2023 CLINICAL DATA:  Shortness of breath. EXAM: PORTABLE CHEST 1 VIEW COMPARISON:  03/01/2023. FINDINGS: Low lung volume. There is mild central vascular congestion, which is likely accentuated by low lung volume. No frank pulmonary edema. Bilateral lung fields are clear. Bilateral costophrenic angles are clear. Stable cardio-mediastinal silhouette. No acute osseous abnormalities. The soft tissues are within normal limits. Right IJ hemodialysis catheter noted with its tip overlying the cavoatrial junction region. IMPRESSION: *Mild central vascular congestion, which is likely accentuated by low lung volume. *Otherwise, no acute cardiopulmonary abnormality. Electronically Signed   By: Jules Schick M.D.   On: 06/20/2023 08:53     Procedures Procedures    Medications Ordered in ED Medications  adenosine (ADENOCARD) 6 MG/2ML injection 6 mg (6 mg Intravenous Given 06/20/23 6045)    ED Course/ Medical Decision Making/ A&P                                 Medical Decision Making Amount and/or Complexity of Data Reviewed Labs: ordered. Radiology: ordered.  Risk Prescription drug management.   Patient with likely SVT.  History of same.  Heart rate at 150.  Blood pressure is a little on the lower side.  Does take midodrine with dialysis.  Reviewed notes from cardiology and has had success  with adenosine in the past.  Will give a trial of this initially.   6 mg of adenosine given.  Initially no response but then laid back a couple minutes after getting the adenosine and converted back to sinus rhythm.  Heart rate is normalized.  Mild hyperkalemia.  Chest x-ray does show some mild volume overload.  Discussed with patient and he is feeling better.  States that he was told he would be able to dialyze today.  Will discharge.  Short-term follow-up with cardiology.       Final Clinical Impression(s) / ED Diagnoses Final diagnoses:  SVT (supraventricular tachycardia) (HCC)  End stage renal disease on dialysis [facility])    Rx / DC Orders ED Discharge Orders          Ordered    Ambulatory referral to Cardiology       Comments: If you have not heard from the Cardiology office within the next 72 hours please call [phone].   06/20/23 1016              Benjiman Core, MD 06/20/23 1016

## 2023-06-22 DIAGNOSIS — Z992 Dependence on renal dialysis: Secondary | ICD-10-CM | POA: Diagnosis not present

## 2023-06-22 DIAGNOSIS — N186 End stage renal disease: Secondary | ICD-10-CM | POA: Diagnosis not present

## 2023-06-22 DIAGNOSIS — N2581 Secondary hyperparathyroidism of renal origin: Secondary | ICD-10-CM | POA: Diagnosis not present

## 2023-06-24 DIAGNOSIS — N186 End stage renal disease: Secondary | ICD-10-CM | POA: Diagnosis not present

## 2023-06-24 DIAGNOSIS — Z992 Dependence on renal dialysis: Secondary | ICD-10-CM | POA: Diagnosis not present

## 2023-06-24 DIAGNOSIS — N2581 Secondary hyperparathyroidism of renal origin: Secondary | ICD-10-CM | POA: Diagnosis not present

## 2023-06-25 DIAGNOSIS — N2581 Secondary hyperparathyroidism of renal origin: Secondary | ICD-10-CM | POA: Diagnosis not present

## 2023-06-25 DIAGNOSIS — N186 End stage renal disease: Secondary | ICD-10-CM | POA: Diagnosis not present

## 2023-06-25 DIAGNOSIS — Z992 Dependence on renal dialysis: Secondary | ICD-10-CM | POA: Diagnosis not present

## 2023-06-26 ENCOUNTER — Other Ambulatory Visit: Payer: Self-pay | Admitting: Cardiology

## 2023-06-27 ENCOUNTER — Ambulatory Visit: Payer: 59 | Attending: Physician Assistant | Admitting: Physician Assistant

## 2023-06-27 ENCOUNTER — Other Ambulatory Visit: Payer: Self-pay

## 2023-06-27 ENCOUNTER — Encounter: Payer: Self-pay | Admitting: Physician Assistant

## 2023-06-27 ENCOUNTER — Encounter (HOSPITAL_COMMUNITY): Payer: Self-pay | Admitting: Vascular Surgery

## 2023-06-27 VITALS — BP 120/64 | HR 91 | Ht 72.0 in | Wt 277.3 lb

## 2023-06-27 DIAGNOSIS — Z992 Dependence on renal dialysis: Secondary | ICD-10-CM

## 2023-06-27 DIAGNOSIS — N186 End stage renal disease: Secondary | ICD-10-CM

## 2023-06-27 DIAGNOSIS — I10 Essential (primary) hypertension: Secondary | ICD-10-CM

## 2023-06-27 DIAGNOSIS — I471 Supraventricular tachycardia, unspecified: Secondary | ICD-10-CM

## 2023-06-27 DIAGNOSIS — N2581 Secondary hyperparathyroidism of renal origin: Secondary | ICD-10-CM | POA: Diagnosis not present

## 2023-06-27 DIAGNOSIS — I5032 Chronic diastolic (congestive) heart failure: Secondary | ICD-10-CM

## 2023-06-27 MED ORDER — METOPROLOL TARTRATE 25 MG PO TABS: 75.0000 mg | ORAL_TABLET | Freq: Two times a day (BID) | ORAL | 11 refills | Status: DC

## 2023-06-27 NOTE — Progress Notes (Signed)
PCP - Dr. Quintin Alto Cardiologist - Dr. Dina Rich (appt at 1530 today)   PPM/ICD - denies   Chest x-ray - 06/20/23 EKG - 06/20/23 Stress Test - 11/17/18- CE ECHO - 01/07/22 Cardiac Cath - denies  CPAP - denies  DM- denies  Blood Thinner Instructions: n/a Aspirin Instructions: continue  ERAS Protcol - no, NPO  COVID TEST- n/a  Anesthesia review: yes  Patient verbally denies any shortness of breath, fever, cough and chest pain during phone call       Questions were answered. Patient verbalized understanding of instructions.

## 2023-06-27 NOTE — Pre-Procedure Instructions (Signed)
-------------    SDW INSTRUCTIONS given:  Your procedure is scheduled on 2/4.  Report to [facility] Main Entrance "A" at 1:30 PM., and check in at the Admitting office.  Any questions or running late day of surgery: call [phone]    Remember:  Do not eat or drink after midnight the night before your surgery     Take these medicines the morning of surgery with A SIP OF WATER  albuterol neb PRN, ASA, cymbalta, metoprolol, percocet PRN, lyrica, symbicort PRN     As of today, STOP taking any Aleve, Naproxen, Ibuprofen, Motrin, Advil, Goody's, BC's, all herbal medications, fish oil, and all vitamins.   Do NOT Smoke (Tobacco/Vaping) 24 hours prior to your procedure  If you use a CPAP at night, you may bring all equipment for your overnight stay.     You will be asked to remove any contacts, glasses, piercing's, hearing aid's, dentures/partials prior to surgery. Please bring cases for these items if needed.     Patients discharged the day of surgery will not be allowed to drive home, and someone needs to stay with them for 24 hours.  SURGICAL WAITING ROOM VISITATION Patients may have no more than 2 support people in the waiting area - these visitors may rotate.   Pre-op nurse will coordinate an appropriate time for 1 ADULT support person, who may not rotate, to accompany patient in pre-op.  Children under the age of 65 must have an adult with them who is not the patient and must remain in the main waiting area with an adult.  If the patient needs to stay at the hospital during part of their recovery, the visitor guidelines for inpatient rooms apply.  Please refer to the [facility] website for the visitor guidelines for any additional information.   Special instructions:   [place]- Preparing For Surgery   Please follow these instructions carefully.   Shower the NIGHT BEFORE SURGERY and the MORNING OF SURGERY with DIAL Soap.   Pat yourself dry with a CLEAN TOWEL.  Wear  CLEAN PAJAMAS to bed the night before surgery  Place CLEAN SHEETS on your bed the night of your first shower and DO NOT SLEEP WITH PETS.   Additional instructions for the day of surgery: DO NOT APPLY any lotions, deodorants, cologne, or perfumes.   Do not wear jewelry or makeup Do not wear nail polish, gel polish, artificial nails, or any other type of covering on natural nails (fingers and toes) Do not bring valuables to the hospital. [facility] is not responsible for valuables/personal belongings. Put on clean/comfortable clothes.  Please brush your teeth.  Ask your nurse before applying any prescription medications to the skin.

## 2023-06-27 NOTE — Anesthesia Preprocedure Evaluation (Signed)
Anesthesia Evaluation  Patient identified by MRN, date of birth, ID band Patient awake    Reviewed: Allergy & Precautions, NPO status , Patient's Chart, lab work & pertinent test results  Airway Mallampati: III  TM Distance: >3 FB Neck ROM: Full    Dental no notable dental hx. (+) Poor Dentition, Missing   Pulmonary asthma , COPD,  COPD inhaler, Current Smoker   Pulmonary exam normal        Cardiovascular hypertension, Pt. on medications and Pt. on home beta blockers +CHF  + dysrhythmias Supra Ventricular Tachycardia  Rhythm:Regular Rate:Normal  TTE 01/07/22:  1. Mild global hypokinesis worse in inferior base . Left ventricular  ejection fraction, by estimation, is 50 to 55%. The left ventricle has low  normal function. The left ventricle demonstrates global hypokinesis. The  left ventricular internal cavity  size was mildly dilated. There is mild left ventricular hypertrophy. Left  ventricular diastolic parameters were normal.   2. Right ventricular systolic function is normal. The right ventricular  size is normal.   3. Left atrial size was moderately dilated.   4. The mitral valve is normal in structure. Trivial mitral valve  regurgitation. No evidence of mitral stenosis.   5. The aortic valve is tricuspid. Aortic valve regurgitation is not  visualized. No aortic stenosis is present.   6. The inferior vena cava is normal in size with greater than 50%  respiratory variability, suggesting right atrial pressure of 3 mmHg.     Neuro/Psych negative neurological ROS  negative psych ROS   GI/Hepatic negative GI ROS, Neg liver ROS,,,  Endo/Other    Renal/GU ESRF and DialysisRenal diseaseRight AV fistula pseudoaneurysm   negative genitourinary   Musculoskeletal  (+) Arthritis , Osteoarthritis,    Abdominal Normal abdominal exam  (+)   Peds  Hematology  (+) Blood dyscrasia, anemia Lab Results      Component                 Value               Date                      WBC                      7.6                 06/20/2023                HGB                      17.7 (H)            06/28/2023                HCT                      52.0                06/28/2023                MCV                      98.3                06/20/2023                PLT  171                 06/20/2023             Lab Results      Component                Value               Date                      NA                       137                 06/28/2023                K                        6.1 (H)             06/28/2023                CO2                      20 (L)              06/28/2023                GLUCOSE                  84                  06/28/2023                BUN                      52 (H)              06/28/2023                CREATININE               12.88 (H)           06/28/2023                CALCIUM                  9.0                 06/28/2023                GFRNONAA                 4 (L)               06/28/2023              Anesthesia Other Findings   Reproductive/Obstetrics                             Anesthesia Physical Anesthesia Plan  ASA: 3  Anesthesia Plan: General   Post-op Pain Management:    Induction: Intravenous  PONV Risk Score and Plan: 1 and Ondansetron, Dexamethasone, Midazolam and Treatment may vary due to age or medical condition  Airway Management Planned: Mask and Oral ETT  Additional Equipment: None  Intra-op Plan:   Post-operative Plan: Extubation in OR  Informed Consent:  I have reviewed the patients History and Physical, chart, labs and discussed the procedure including the risks, benefits and alternatives for the proposed anesthesia with the patient or authorized representative who has indicated his/her understanding and acceptance.     Dental advisory given  Plan Discussed with: CRNA  Anesthesia Plan  Comments: (PAT note by Antionette Poles, PA-C: Follows with cardiology for history of HTN, HFrEF with EF 35% in 2011- normalized to 60-65% by imaging in 12/2013 (most recent echo 12/2021 with EF 50-55%),  SVT maintained on metoprolol (ER visit on 07/05/22 with recurrent SVT, converted with adenosine), hypotension - uses midodrine on dialysis days.   Pt was seen in the ED 05/30/23 in SVT. Per consult note by cardiologist Dr. Dina Rich, "Patient discussed with primary team, history of PSVT presented with SVT. Did not convert with adenosine, converted shortly after starting ditiazem drip. Would increase lopressor to 50mg  bid, f/u Mg make sure optimized. No additional cardiolgy recs, no indication for admission from cardiac standpoint. We will arrange f/u"  He was again seen in the ED on 06/20/23 for SVT. Per MD note, "6 mg of adenosine given. Initially no response but then laid back a couple minutes after getting the adenosine and converted back to sinus rhythm. Heart rate is normalized. Mild hyperkalemia. Chest x-ray does show some mild volume overload. Discussed with patient and he is feeling better. States that he was told he would be able to dialyze today. Will discharge."  Pt had followup with Ronie Spies, PA-C 06/27/23. Noted to be euvolemic at that time, compliant with dialysis. Regarding recurrent SVT, " PSVT - discussed with Dr. Wyline Mood, will titrate Lopressor to 75mg  BID (in the form of 3 25mg  tablets twice daily per discussion with patient). Will schedule f/u with Dr. Ladona Ridgel as patient now interested in moving forward with ablation as previously discussed. Check TSH and free T4. Continue lyte management with HD."  ESRD HD Monday Wednesday Friday via right internal jugular TDC. Hx of left upper arm brachial artery to basilic vein AV fistula creation on 05/03/23 by Dr. Randie Heinz. He has an on occluded left forearm loop graft as well as a recently occluded right radiocephalic AV fistula to interposition Artegraft.    Current smoker with associated COPD, maintained on Symbicort and as needed albuterol.  BMP and CBC 06/20/23 reviewed, creatinine 16.26 c/w ESRD, potassium mildly elevated 5.3, otherwise unremarkable.   Patient will need DOS surgery labs and evaluation.  EKG 06/20/23: Sinus rhythm. Rate 86. Left atrial enlargement. Low voltage, precordial leads. Abnormal R-wave progression, early transition. SVT resolved  TTE 01/07/22: 1. Mild global hypokinesis worse in inferior base . Left ventricular  ejection fraction, by estimation, is 50 to 55%. The left ventricle has low  normal function. The left ventricle demonstrates global hypokinesis. The  left ventricular internal cavity  size was mildly dilated. There is mild left ventricular hypertrophy. Left  ventricular diastolic parameters were normal.  2. Right ventricular systolic function is normal. The right ventricular  size is normal.  3. Left atrial size was moderately dilated.  4. The mitral valve is normal in structure. Trivial mitral valve  regurgitation. No evidence of mitral stenosis.  5. The aortic valve is tricuspid. Aortic valve regurgitation is not  visualized. No aortic stenosis is present.  6. The inferior vena cava is normal in size with greater than 50%  respiratory variability, suggesting right atrial pressure of 3 mmHg.   Nuclear stress 04/23/19:  No diagnostic ST segment changes to indicate  ischemia.  Small, mild intensity, inferior defect that is partially reversible at the apex in the setting of increased radiotracer uptake on rest imaging near the inferior wall. Otherwise defect is fixed and suggestive of soft tissue attenuation. Cannot exclude a mild inferior apical ischemic territory, although with reduced specificity.  This is a low risk study.  Nuclear stress EF: 53%.  )        Anesthesia Quick Evaluation

## 2023-06-27 NOTE — Progress Notes (Addendum)
Cardiology Office Note    Date:  06/27/2023  ID:  Brian Barrera, DOB 1970-05-28, MRN [id number] PCP:  Juliette Alcide, MD  Cardiologist:  Dina Rich, MD  Electrophysiologist:  None   Chief Complaint: f/u SVT  History of Present Illness: .    Brian Barrera is a 53 y.o. male with visit-pertinent history of ESRD on HD with hypotension requiring midodrine, chronic HFimpEF, HLD, HTN, SVT, anemia, tobacco abuse, obesity recently seen in ED twice in January for recurrent SVT. He is reported to have had prior EF 35% in 2011 but improved since then. Nuc 03/2019 showed defect suggestive of soft tissue attenuation, cannot exclude a mild inferior apical ischemic territory, although with reduced specificity, low risk study, EF 53%. Last echo 12/2021 EF 50-55%, global HK, moderate LAE.   He reports that each time he had shown up for HD prior to start of treatment and was noted to be in SVT. On 1/6 he did not convert with adenosine or metoprolol so was given IV diltiazem with reversion to NSR. Dr. Wyline Mood advised increase In metoprolol 50mg  BID and Mg management per primary team. On second episode 1/27 he converted a few minutes after adenosine. He reports compliance with HD and increased dose of metoprolol. States he hasn't missed a dialysis treatment in 16 years. Denies any CP or SOB. Denies recent issues with low BP at HD requiring early discontinuation of treatment. He has not had recurrent palpitations since ED visit 1 week ago.   Labwork independently reviewed: 05/2023 Hgb 15.2, plt ok, Mg elevated, K elevated, Cr 16.26 2023 TSH OK 2014 LDL 96  ROS: .    Please see the history of present illness.  All other systems are reviewed and otherwise negative.  Studies Reviewed: [name]    EKG:  EKG is not ordered today but personally reviewed ED EKGs as well as EKGs back to 07/05/22 ED: First tracing narrow complex tachycardia suspected SVT 149bpm with nonspecific STTW changes. F/u tracing NSR  86bpm, subtle ST depression III, avF, V5-V6, ST upsloping avL.  CV Studies: Cardiac studies reviewed are outlined and summarized above. Otherwise please see EMR for full report.   Current Reported Medications:.    Current Meds  Medication Sig   albuterol (ACCUNEB) 1.25 MG/3ML nebulizer solution Take 1 ampule by nebulization every 4 (four) hours as needed for wheezing or shortness of breath.   aspirin EC 81 MG tablet Take 81 mg by mouth in the morning.   atorvastatin (LIPITOR) 10 MG tablet Take 10 mg by mouth every evening.   AURYXIA 1 GM 210 MG(Fe) tablet Take 420 mg by mouth 3 (three) times daily with meals. And 420 mg with each snack   cinacalcet (SENSIPAR) 60 MG tablet Take 60 mg by mouth every Monday, Wednesday, and Friday with hemodialysis.   diphenhydramine-acetaminophen (TYLENOL PM) 25-500 MG TABS tablet Take 1 tablet by mouth at bedtime as needed (sleep).   DULoxetine (CYMBALTA) 60 MG capsule Take 60 mg by mouth in the morning.   lidocaine-prilocaine (EMLA) cream Apply 1 Application topically daily as needed (fistula acces).   metoprolol tartrate (LOPRESSOR) 50 MG tablet Take 1 tablet (50 mg total) by mouth 2 (two) times daily.   midodrine (PROAMATINE) 5 MG tablet Take 5 mg by mouth every Monday, Wednesday, and Friday.   Multiple Vitamins-Minerals (MULTIVITAMIN WITH MINERALS) tablet Take 1 tablet by mouth daily. Men 50+   Omega-3 1000 MG CAPS Take 2,000 mg by mouth daily.   oxyCODONE-acetaminophen (  PERCOCET) 10-325 MG tablet Take 1 tablet by mouth every 4 (four) hours as needed for pain.   pregabalin (LYRICA) 75 MG capsule Take 75 mg by mouth 2 (two) times daily.   sevelamer carbonate (RENVELA) 800 MG tablet Take 1,600-3,200 mg by mouth See admin instructions. Take 3200 mg with meals and 1600 with snack   sildenafil (REVATIO) 20 MG tablet Take 40-60 mg by mouth daily as needed.   SYMBICORT 160-4.5 MCG/ACT inhaler Inhale 2 puffs into the lungs 2 (two) times daily as needed (shortness  of breath).    Physical Exam:    VS:  BP 120/64   Pulse 91   Ht 6' (1.829 m)   Wt 277 lb 4.8 oz (125.8 kg)   SpO2 96%   BMI 37.61 kg/m    Wt Readings from Last 3 Encounters:  06/27/23 277 lb 4.8 oz (125.8 kg)  06/20/23 273 lb 13 oz (124.2 kg)  06/15/23 273 lb 14.4 oz (124.2 kg)    GEN: Well nourished, well developed in no acute distress NECK: No JVD; No carotid bruits CARDIAC: RRR, no murmurs, rubs, gallops RESPIRATORY:  Clear to auscultation without rales, wheezing or rhonchi  ABDOMEN: Soft, non-tender, non-distended EXTREMITIES:  No edema; No acute deformity   Asessement and Plan:.    1. PSVT - discussed with Dr. Wyline Mood, will titrate Lopressor to 75mg  BID (in the form of 3 25mg  tablets twice daily per discussion with patient). Will schedule f/u with Dr. Ladona Ridgel as patient now interested in moving forward with ablation as previously discussed. Check TSH and free T4. Continue lyte management with HD.  2. Chronic HFimpEF - appears euvolemic, no acute decompensation recently. Volume managed by HD. Follow clinically.  3. Essential HTN with hx of hypotension - stable, follow with dose titration above. Continue midodrine MWF.  4. ESRD on HD - on HD MWF, adherent with dialysis sessions. Management per nephrology. It looks like he is scheduled to have excision of right AV fistula pseudoaneurysm tomorrow with vascular surgery. I confirmed with Dr. Wyline Mood we are OK with him proceeding with this.    Disposition: F/u with Dr. Ladona Ridgel to discuss ablation. Otherwise f/u Dr. Wyline Mood 6 months.  Signed, Laurann Montana, PA-C

## 2023-06-27 NOTE — Progress Notes (Signed)
Anesthesia Chart Review: Same day workup  Follows with cardiology for history of HTN, HFrEF with EF 35% in 2011- normalized to 60-65% by imaging in 12/2013 (most recent echo 12/2021 with EF 50-55%),  SVT maintained on metoprolol (ER visit on 07/05/22 with recurrent SVT, converted with adenosine), hypotension - uses midodrine on dialysis days.   Pt was seen in the ED 05/30/23 in SVT. Per consult note by cardiologist Dr. Dina Rich, "Patient discussed with primary team, history of PSVT presented with SVT. Did not convert with adenosine, converted shortly after starting ditiazem drip. Would increase lopressor to 50mg  bid, f/u Mg make sure optimized. No additional cardiolgy recs, no indication for admission from cardiac standpoint. We will arrange f/u"  He was again seen in the ED on 06/20/23 for SVT. Per MD note, "6 mg of adenosine given. Initially no response but then laid back a couple minutes after getting the adenosine and converted back to sinus rhythm. Heart rate is normalized. Mild hyperkalemia. Chest x-ray does show some mild volume overload. Discussed with patient and he is feeling better. States that he was told he would be able to dialyze today. Will discharge."  Pt had followup with Ronie Spies, PA-C 06/27/23. Noted to be euvolemic at that time, compliant with dialysis. Regarding recurrent SVT, " PSVT - discussed with Dr. Wyline Mood, will titrate Lopressor to 75mg  BID (in the form of 3 25mg  tablets twice daily per discussion with patient). Will schedule f/u with Dr. Ladona Ridgel as patient now interested in moving forward with ablation as previously discussed. Check TSH and free T4. Continue lyte management with HD."   ESRD HD Monday Wednesday Friday via right internal jugular TDC. Hx of left upper arm brachial artery to basilic vein AV fistula creation on 05/03/23 by Dr. Randie Heinz. He has an on occluded left forearm loop graft as well as a recently occluded right radiocephalic AV fistula to interposition  Artegraft.   Current smoker with associated COPD, maintained on Symbicort and as needed albuterol.   BMP and CBC 06/20/23 reviewed, creatinine 16.26 c/w ESRD, potassium mildly elevated 5.3, otherwise unremarkable.   Patient will need DOS surgery labs and evaluation.   EKG 06/20/23: Sinus rhythm. Rate 86. Left atrial enlargement. Low voltage, precordial leads. Abnormal R-wave progression, early transition. SVT resolved   TTE 01/07/22:  1. Mild global hypokinesis worse in inferior base . Left ventricular  ejection fraction, by estimation, is 50 to 55%. The left ventricle has low  normal function. The left ventricle demonstrates global hypokinesis. The  left ventricular internal cavity  size was mildly dilated. There is mild left ventricular hypertrophy. Left  ventricular diastolic parameters were normal.   2. Right ventricular systolic function is normal. The right ventricular  size is normal.   3. Left atrial size was moderately dilated.   4. The mitral valve is normal in structure. Trivial mitral valve  regurgitation. No evidence of mitral stenosis.   5. The aortic valve is tricuspid. Aortic valve regurgitation is not  visualized. No aortic stenosis is present.   6. The inferior vena cava is normal in size with greater than 50%  respiratory variability, suggesting right atrial pressure of 3 mmHg.    Nuclear stress 04/23/19: No diagnostic ST segment changes to indicate ischemia. Small, mild intensity, inferior defect that is partially reversible at the apex in the setting of increased radiotracer uptake on rest imaging near the inferior wall. Otherwise defect is fixed and suggestive of soft tissue attenuation. Cannot exclude a mild inferior apical  ischemic territory, although with reduced specificity. This is a low risk study. Nuclear stress EF: 53%.    Zannie Cove [facility] Short Stay Center/Anesthesiology Phone [phone] 06/27/2023 4:32 PM

## 2023-06-27 NOTE — Patient Instructions (Signed)
Medication Instructions:  Your physician has recommended you make the following change in your medication:   Increase Metoprolol to 75 mg Two Times Daily   *If you need a refill on your cardiac medications before your next appointment, please call your pharmacy*   Lab Work: Your physician recommends that you return for lab work ( TSH, Free T4)     If you have labs (blood work) drawn today and your tests are completely normal, you will receive your results only by: MyChart Message (if you have MyChart) OR A paper copy in the mail If you have any lab test that is abnormal or we need to change your treatment, we will call you to review the results.   Testing/Procedures: NONE    Follow-Up: At [facility], you and your health needs are our priority.  As part of our continuing mission to provide you with exceptional heart care, we have created designated Provider Care Teams.  These Care Teams include your primary Cardiologist (physician) and Advanced Practice Providers (APPs -  Physician Assistants and Nurse Practitioners) who all work together to provide you with the care you need, when you need it.  We recommend signing up for the patient portal called "MyChart".  Sign up information is provided on this After Visit Summary.  MyChart is used to connect with patients for Virtual Visits (Telemedicine).  Patients are able to view lab/test results, encounter notes, upcoming appointments, etc.  Non-urgent messages can be sent to your provider as well.   To learn more about what you can do with MyChart, go to [email or web address].    Your next appointment:   6 month(s)  Provider:   You may see Dina Rich, MD or one of the following Advanced Practice Providers on your designated Care Team:   Randall An, PA-C  Jacolyn Reedy, [place]     Other Instructions Thank you for choosing [facility]!

## 2023-06-28 ENCOUNTER — Encounter (HOSPITAL_COMMUNITY)
Admission: RE | Disposition: A | Discharge status: Home / Self Care | Payer: Self-pay | Source: Home / Self Care | Attending: Vascular Surgery

## 2023-06-28 ENCOUNTER — Other Ambulatory Visit: Payer: Self-pay

## 2023-06-28 ENCOUNTER — Ambulatory Visit (HOSPITAL_BASED_OUTPATIENT_CLINIC_OR_DEPARTMENT_OTHER): Payer: 59 | Admitting: Physician Assistant

## 2023-06-28 ENCOUNTER — Encounter (HOSPITAL_COMMUNITY): Payer: Self-pay | Admitting: Vascular Surgery

## 2023-06-28 ENCOUNTER — Ambulatory Visit (HOSPITAL_COMMUNITY)
Admission: RE | Admit: 2023-06-28 | Discharge: 2023-06-28 | Disposition: A | Discharge status: Home / Self Care | Payer: 59 | Attending: Vascular Surgery | Admitting: Vascular Surgery

## 2023-06-28 ENCOUNTER — Ambulatory Visit (HOSPITAL_COMMUNITY): Payer: 59 | Admitting: Physician Assistant

## 2023-06-28 DIAGNOSIS — I471 Supraventricular tachycardia, unspecified: Secondary | ICD-10-CM | POA: Diagnosis not present

## 2023-06-28 DIAGNOSIS — T82898A Other specified complication of vascular prosthetic devices, implants and grafts, initial encounter: Secondary | ICD-10-CM

## 2023-06-28 DIAGNOSIS — Y832 Surgical operation with anastomosis, bypass or graft as the cause of abnormal reaction of the patient, or of later complication, without mention of misadventure at the time of the procedure: Secondary | ICD-10-CM | POA: Diagnosis not present

## 2023-06-28 DIAGNOSIS — Z992 Dependence on renal dialysis: Secondary | ICD-10-CM

## 2023-06-28 DIAGNOSIS — F17200 Nicotine dependence, unspecified, uncomplicated: Secondary | ICD-10-CM | POA: Insufficient documentation

## 2023-06-28 DIAGNOSIS — E1122 Type 2 diabetes mellitus with diabetic chronic kidney disease: Secondary | ICD-10-CM

## 2023-06-28 DIAGNOSIS — I5032 Chronic diastolic (congestive) heart failure: Secondary | ICD-10-CM | POA: Diagnosis not present

## 2023-06-28 DIAGNOSIS — I509 Heart failure, unspecified: Secondary | ICD-10-CM | POA: Diagnosis not present

## 2023-06-28 DIAGNOSIS — J4489 Other specified chronic obstructive pulmonary disease: Secondary | ICD-10-CM | POA: Diagnosis not present

## 2023-06-28 DIAGNOSIS — Z79899 Other long term (current) drug therapy: Secondary | ICD-10-CM | POA: Insufficient documentation

## 2023-06-28 DIAGNOSIS — I132 Hypertensive heart and chronic kidney disease with heart failure and with stage 5 chronic kidney disease, or end stage renal disease: Secondary | ICD-10-CM | POA: Insufficient documentation

## 2023-06-28 DIAGNOSIS — T82848A Pain from vascular prosthetic devices, implants and grafts, initial encounter: Secondary | ICD-10-CM | POA: Diagnosis not present

## 2023-06-28 DIAGNOSIS — F17210 Nicotine dependence, cigarettes, uncomplicated: Secondary | ICD-10-CM | POA: Diagnosis not present

## 2023-06-28 DIAGNOSIS — N185 Chronic kidney disease, stage 5: Secondary | ICD-10-CM | POA: Diagnosis not present

## 2023-06-28 DIAGNOSIS — T82510A Breakdown (mechanical) of surgically created arteriovenous fistula, initial encounter: Secondary | ICD-10-CM | POA: Diagnosis not present

## 2023-06-28 DIAGNOSIS — M1990 Unspecified osteoarthritis, unspecified site: Secondary | ICD-10-CM | POA: Insufficient documentation

## 2023-06-28 DIAGNOSIS — N186 End stage renal disease: Secondary | ICD-10-CM | POA: Insufficient documentation

## 2023-06-28 DIAGNOSIS — I120 Hypertensive chronic kidney disease with stage 5 chronic kidney disease or end stage renal disease: Secondary | ICD-10-CM

## 2023-06-28 DIAGNOSIS — Z7951 Long term (current) use of inhaled steroids: Secondary | ICD-10-CM | POA: Diagnosis not present

## 2023-06-28 DIAGNOSIS — T82868A Thrombosis of vascular prosthetic devices, implants and grafts, initial encounter: Secondary | ICD-10-CM | POA: Insufficient documentation

## 2023-06-28 HISTORY — PX: REVISON OF ARTERIOVENOUS FISTULA: SHX6074

## 2023-06-28 HISTORY — PX: BASCILIC VEIN TRANSPOSITION: SHX5742

## 2023-06-28 LAB — POCT I-STAT, CHEM 8
BUN: 50 mg/dL — ABNORMAL HIGH (ref 6–20)
BUN: 50 mg/dL — ABNORMAL HIGH (ref 6–20)
Calcium, Ion: 1.02 mmol/L — ABNORMAL LOW (ref 1.15–1.40)
Calcium, Ion: 0.99 mmol/L — ABNORMAL LOW (ref 1.15–1.40)
Chloride: 103 mmol/L (ref 98–111)
Chloride: 104 mmol/L (ref 98–111)
Creatinine, Ser: 14.2 mg/dL — ABNORMAL HIGH (ref 0.61–1.24)
Creatinine, Ser: 14.4 mg/dL — ABNORMAL HIGH (ref 0.61–1.24)
Glucose, Bld: 86 mg/dL (ref 70–99)
Glucose, Bld: 85 mg/dL (ref 70–99)
HCT: 52 % (ref 39.0–52.0)
HCT: 52 % (ref 39.0–52.0)
Hemoglobin: 17.7 g/dL — ABNORMAL HIGH (ref 13.0–17.0)
Hemoglobin: 17.7 g/dL — ABNORMAL HIGH (ref 13.0–17.0)
Potassium: 6 mmol/L — ABNORMAL HIGH (ref 3.5–5.1)
Potassium: 6 mmol/L — ABNORMAL HIGH (ref 3.5–5.1)
Sodium: 136 mmol/L (ref 135–145)
Sodium: 135 mmol/L (ref 135–145)
TCO2: 25 mmol/L (ref 22–32)
TCO2: 26 mmol/L (ref 22–32)

## 2023-06-28 LAB — BASIC METABOLIC PANEL
Anion gap: 19 — ABNORMAL HIGH (ref 5–15)
BUN: 52 mg/dL — ABNORMAL HIGH (ref 6–20)
CO2: 20 mmol/L — ABNORMAL LOW (ref 22–32)
Calcium: 9 mg/dL (ref 8.9–10.3)
Chloride: 98 mmol/L (ref 98–111)
Creatinine, Ser: 12.88 mg/dL — ABNORMAL HIGH (ref 0.61–1.24)
GFR, Estimated: 4 mL/min — ABNORMAL LOW (ref >60)
Glucose, Bld: 84 mg/dL (ref 70–99)
Potassium: 6.1 mmol/L — ABNORMAL HIGH (ref 3.5–5.1)
Sodium: 137 mmol/L (ref 135–145)

## 2023-06-28 SURGERY — REVISON OF ARTERIOVENOUS FISTULA
Anesthesia: General | Site: Arm Upper | Laterality: Right

## 2023-06-28 MED ORDER — EPHEDRINE 5 MG/ML INJ
INTRAVENOUS | Status: AC
  Filled 2023-06-28: qty 5

## 2023-06-28 MED ORDER — LACTATED RINGERS IV SOLN: INTRAVENOUS | Status: DC

## 2023-06-28 MED ORDER — FENTANYL CITRATE (PF) 250 MCG/5ML IJ SOLN
INTRAMUSCULAR | Status: AC
  Filled 2023-06-28: qty 5

## 2023-06-28 MED ORDER — ONDANSETRON HCL 4 MG/2ML IJ SOLN
INTRAMUSCULAR | Status: AC
  Filled 2023-06-28: qty 2

## 2023-06-28 MED ORDER — ORAL CARE MOUTH RINSE: 15.0000 mL | Freq: Once | OROMUCOSAL | Status: AC

## 2023-06-28 MED ORDER — HEPARIN 6000 UNIT IRRIGATION SOLUTION
Status: AC
  Filled 2023-06-28: qty 500

## 2023-06-28 MED ORDER — CHLORHEXIDINE GLUCONATE 4 % EX SOLN: 60.0000 mL | Freq: Once | CUTANEOUS | Status: DC

## 2023-06-28 MED ORDER — ACETAMINOPHEN 10 MG/ML IV SOLN: 1000.0000 mg | Freq: Once | INTRAVENOUS | Status: DC | PRN

## 2023-06-28 MED ORDER — LIDOCAINE 2% (20 MG/ML) 5 ML SYRINGE
INTRAMUSCULAR | Status: AC
  Filled 2023-06-28: qty 5

## 2023-06-28 MED ORDER — STERILE WATER FOR IRRIGATION IR SOLN
Status: DC | PRN
  Administered 2023-06-28: 1000 mL

## 2023-06-28 MED ORDER — ONDANSETRON HCL 4 MG/2ML IJ SOLN
INTRAMUSCULAR | Status: DC | PRN
  Administered 2023-06-28: 4 mg via INTRAVENOUS

## 2023-06-28 MED ORDER — CEFAZOLIN SODIUM-DEXTROSE 3-4 GM/150ML-% IV SOLN
3.0000 g | INTRAVENOUS | Status: AC
  Administered 2023-06-28: 3 g via INTRAVENOUS

## 2023-06-28 MED ORDER — FENTANYL CITRATE (PF) 100 MCG/2ML IJ SOLN
25.0000 ug | INTRAMUSCULAR | Status: DC | PRN
Start: 2023-06-28 — End: 2023-06-29

## 2023-06-28 MED ORDER — DEXAMETHASONE SODIUM PHOSPHATE 10 MG/ML IJ SOLN
INTRAMUSCULAR | Status: AC
  Filled 2023-06-28: qty 1

## 2023-06-28 MED ORDER — SODIUM CHLORIDE 0.9 % IV SOLN: INTRAVENOUS | Status: DC

## 2023-06-28 MED ORDER — DEXAMETHASONE SODIUM PHOSPHATE 10 MG/ML IJ SOLN
INTRAMUSCULAR | Status: DC | PRN
  Administered 2023-06-28: 5 mg via INTRAVENOUS

## 2023-06-28 MED ORDER — CHLORHEXIDINE GLUCONATE 0.12 % MT SOLN: 15.0000 mL | Freq: Once | OROMUCOSAL | Status: AC

## 2023-06-28 MED ORDER — LIDOCAINE 2% (20 MG/ML) 5 ML SYRINGE
INTRAMUSCULAR | Status: DC | PRN
  Administered 2023-06-28: 80 mg via INTRAVENOUS

## 2023-06-28 MED ORDER — MIDAZOLAM HCL 2 MG/2ML IJ SOLN
INTRAMUSCULAR | Status: AC
  Filled 2023-06-28: qty 2

## 2023-06-28 MED ORDER — SUGAMMADEX SODIUM 200 MG/2ML IV SOLN
INTRAVENOUS | Status: DC | PRN
  Administered 2023-06-28: 250 mg via INTRAVENOUS

## 2023-06-28 MED ORDER — CEFAZOLIN SODIUM-DEXTROSE 3-4 GM/150ML-% IV SOLN: 3.0000 g | INTRAVENOUS | Status: DC

## 2023-06-28 MED ORDER — OXYCODONE-ACETAMINOPHEN 5-325 MG PO TABS: 1.0000 | ORAL_TABLET | Freq: Four times a day (QID) | ORAL | 0 refills | Status: DC | PRN

## 2023-06-28 MED ORDER — PROPOFOL 10 MG/ML IV BOLUS
INTRAVENOUS | Status: DC | PRN
  Administered 2023-06-28: 200 mg via INTRAVENOUS

## 2023-06-28 MED ORDER — CHLORHEXIDINE GLUCONATE 0.12 % MT SOLN
OROMUCOSAL | Status: AC
  Administered 2023-06-28: 15 mL via OROMUCOSAL
  Filled 2023-06-28: qty 15

## 2023-06-28 MED ORDER — PROPOFOL 10 MG/ML IV BOLUS
INTRAVENOUS | Status: AC
  Filled 2023-06-28: qty 20

## 2023-06-28 MED ORDER — PHENYLEPHRINE 80 MCG/ML (10ML) SYRINGE FOR IV PUSH (FOR BLOOD PRESSURE SUPPORT)
PREFILLED_SYRINGE | INTRAVENOUS | Status: AC
  Filled 2023-06-28: qty 10

## 2023-06-28 MED ORDER — ALBUMIN HUMAN 5 % IV SOLN: INTRAVENOUS | Status: DC | PRN

## 2023-06-28 MED ORDER — HEPARIN 6000 UNIT IRRIGATION SOLUTION
Status: DC | PRN
  Administered 2023-06-28: 1

## 2023-06-28 MED ORDER — 0.9 % SODIUM CHLORIDE (POUR BTL) OPTIME
TOPICAL | Status: DC | PRN
  Administered 2023-06-28: 1000 mL

## 2023-06-28 MED ORDER — ROCURONIUM BROMIDE 10 MG/ML (PF) SYRINGE
PREFILLED_SYRINGE | INTRAVENOUS | Status: AC
  Filled 2023-06-28: qty 10

## 2023-06-28 MED ORDER — FENTANYL CITRATE (PF) 250 MCG/5ML IJ SOLN
INTRAMUSCULAR | Status: DC | PRN
  Administered 2023-06-28: 100 ug via INTRAVENOUS
  Administered 2023-06-28: 50 ug via INTRAVENOUS
  Administered 2023-06-28: 100 ug via INTRAVENOUS

## 2023-06-28 MED ORDER — PHENYLEPHRINE HCL-NACL 20-0.9 MG/250ML-% IV SOLN
INTRAVENOUS | Status: DC | PRN
  Administered 2023-06-28: 20 ug/min via INTRAVENOUS

## 2023-06-28 MED ORDER — ROCURONIUM BROMIDE 10 MG/ML (PF) SYRINGE
PREFILLED_SYRINGE | INTRAVENOUS | Status: DC | PRN
  Administered 2023-06-28 (×2): 50 mg via INTRAVENOUS

## 2023-06-28 SURGICAL SUPPLY — 41 items
ARMBAND PINK RESTRICT EXTREMIT (MISCELLANEOUS) ×2 IMPLANT
BAG COUNTER SPONGE SURGICOUNT (BAG) ×2 IMPLANT
BNDG ELASTIC 4INX 5YD STR LF (GAUZE/BANDAGES/DRESSINGS) IMPLANT
BNDG GAUZE DERMACEA FLUFF 4 (GAUZE/BANDAGES/DRESSINGS) IMPLANT
CANISTER SUCT 3000ML PPV (MISCELLANEOUS) ×2 IMPLANT
CLIP LIGATING EXTRA MED SLVR (CLIP) ×2 IMPLANT
CLIP LIGATING EXTRA SM BLUE (MISCELLANEOUS) ×2 IMPLANT
CLIP TI MEDIUM 6 (CLIP) IMPLANT
CLIP TI WIDE RED SMALL 6 (CLIP) IMPLANT
COVER MAYO STAND STRL (DRAPES) IMPLANT
COVER PROBE W GEL 5X96 (DRAPES) ×2 IMPLANT
DERMABOND ADVANCED .7 DNX12 (GAUZE/BANDAGES/DRESSINGS) ×2 IMPLANT
DERMABOND ADVANCED .7 DNX6 (GAUZE/BANDAGES/DRESSINGS) IMPLANT
DRSG MEPILEX POST OP 4X8 (GAUZE/BANDAGES/DRESSINGS) IMPLANT
ELECT REM PT RETURN 9FT ADLT (ELECTROSURGICAL) ×2
ELECTRODE REM PT RTRN 9FT ADLT (ELECTROSURGICAL) ×2 IMPLANT
GAUZE 4X4 16PLY ~~LOC~~+RFID DBL (SPONGE) IMPLANT
GAUZE SPONGE 4X4 12PLY STRL (GAUZE/BANDAGES/DRESSINGS) IMPLANT
GLOVE BIO SURGEON STRL SZ7.5 (GLOVE) ×2 IMPLANT
GOWN STRL REUS W/ TWL LRG LVL3 (GOWN DISPOSABLE) ×4 IMPLANT
GOWN STRL REUS W/ TWL XL LVL3 (GOWN DISPOSABLE) ×2 IMPLANT
KIT BASIN OR (CUSTOM PROCEDURE TRAY) ×2 IMPLANT
KIT TURNOVER KIT B (KITS) ×2 IMPLANT
LOOP VASCLR MAXI BLUE 18IN ST (MISCELLANEOUS) IMPLANT
LOOP VESSEL MINI RED (MISCELLANEOUS) IMPLANT
NS IRRIG 1000ML POUR BTL (IV SOLUTION) ×2 IMPLANT
PACK CV ACCESS (CUSTOM PROCEDURE TRAY) ×2 IMPLANT
PAD ARMBOARD 7.5X6 YLW CONV (MISCELLANEOUS) ×4 IMPLANT
POWDER SURGICEL 3.0 GRAM (HEMOSTASIS) IMPLANT
SLING ARM FOAM STRAP LRG (SOFTGOODS) IMPLANT
SLING ARM FOAM STRAP MED (SOFTGOODS) IMPLANT
SUT MNCRL AB 4-0 PS2 18 (SUTURE) ×2 IMPLANT
SUT PROLENE 5 0 C 1 24 (SUTURE) IMPLANT
SUT PROLENE 6 0 BV (SUTURE) ×2 IMPLANT
SUT SILK 2 0 SH (SUTURE) IMPLANT
SUT SILK 3-0 18XBRD TIE 12 (SUTURE) IMPLANT
SUT VIC AB 3-0 SH 27X BRD (SUTURE) ×2 IMPLANT
TOWEL GREEN STERILE (TOWEL DISPOSABLE) ×2 IMPLANT
UNDERPAD 30X36 HEAVY ABSORB (UNDERPADS AND DIAPERS) ×2 IMPLANT
VASCULAR TIE MAXI BLUE 18IN ST (MISCELLANEOUS) ×2
WATER STERILE IRR 1000ML POUR (IV SOLUTION) ×2 IMPLANT

## 2023-06-28 NOTE — Discharge Instructions (Signed)

## 2023-06-28 NOTE — Transfer of Care (Signed)
 Immediate Anesthesia Transfer of Care Note  Patient: Brian Barrera  Procedure(s) Performed: EXCISION OF RIGHT ARTERIOVENOUS FISTULA PSEUDOANEURYSM (Right: Arm Upper) LEFT ARM BASILIC VEIN TRANSPOSITION (Left: Arm Lower)  Patient Location: PACU  Anesthesia Type:General  Level of Consciousness: awake  Airway & Oxygen Therapy: Patient Spontanous Breathing  Post-op Assessment: Report given to RN and Post -op Vital signs reviewed and stable  Post vital signs: Reviewed and stable  Last Vitals:  Vitals Value Taken Time  BP 155/85 06/28/23 2030  Temp    Pulse 84 06/28/23 2032  Resp 16 06/28/23 2032  SpO2 94 % 06/28/23 2032  Vitals shown include unfiled device data.  Last Pain:  Vitals:   06/28/23 1341  PainSc: 8       Patients Stated Pain Goal: 2 (06/28/23 1341)  Complications: No notable events documented.

## 2023-06-28 NOTE — Progress Notes (Signed)
IV RN at bedside to deaccess HD catheter that was accessed in the OR.

## 2023-06-28 NOTE — Op Note (Signed)
 Patient name: Brian Barrera MRN: [id number] DOB: Nov 27, 1970 Sex: male  06/28/2023 Pre-operative Diagnosis: End-stage renal disease, occluded right arm AV fistula with painful pseudoaneurysm Post-operative diagnosis:  Same Surgeon:  Penne BROCKS. Sheree, MD Assistants: Norman Serve, MD; Maurilio Collet, PA Procedure Performed: 1.  Excision right wrist AV fistula pseudoaneurysm 2.  Revision left upper arm basilic vein AV fistula with transposition  Indications: 53 year old male currently dialyzing via catheter in the right IJ has a history of right arm AV access which is thrombosed with painful pseudoaneurysm.  He also has a first stage basilic vein fistula that requires superficialization versus transposition prior to use.  Experience assistants were necessary to expedite the bilateral nature of this case.  Dr. Serve specifically performed the excision of the pseudoaneurysm of the right forearm by Deland Collet, PA was instrumental in providing suction, traction and facilitating exposure of the basilic vein as well as tunneling and creating new arteriovenous anastomosis.  Findings: The right arm forearm fistula was thrombosed pseudoaneurysm and surrounding skin was excised and this was primarily closed.  The left upper arm basilic vein fistula was sclerotic for approximately 3 cm and this was all excised back to the healthy brachial artery the rest of the vein with suitable size for fistula creation at completion there was a very strong thrill in the vein confirmed with Doppler and a palpable radial artery pulse at the wrist also confirmed with Doppler.   Procedure:  The patient was identified in the holding area and taken to the operating room where he he was placed supine on the operating table and general anesthesia was induced.  He was sterilely prepped and draped in the bilateral upper extremities, timeout was called and antibiotics were administered.  Dr. Serve focused on the right arm  where he excised the right forearm pseudoaneurysm through an elliptical incision and used cautery to encircled this.  It was tied off proximally and distally and removed.  Part of the skin was excised and primarily closed with Vicryl Monocryl and Dermabond was placed at the skin site.  In the left upper extremity ultrasound was used to identify the basilic vein which did join the deep system in the mid upper arm.  3 separate incisions were created above the antecubitum including a previous antecubital incision in the vein was dissected throughout the upper arm dividing branches between ties.  We dissected back to the brachial artery proximally and distally were able to get Vesseloops around this.  Brachial artery was then clamped distally and proximally the fistula was transected.  This was tunneled laterally after marking for orientation and flushed with heparinized saline.  The artery was flushed distally and proximally with heparinized saline.  I did have to excise approximately 3 cm of the vein back into a healthy segment and I cleaned up the arteriovenous anastomosis as well.  The vein was then sewn into side with 6-0 Prolene suture.  Prior completion without flushing all directions.  Upon completion we freed up some of the soft tissue around the fistula and there was a very strong thrill confirmed with Doppler and there was a palpable radial artery pulse at the wrist also confirmed with Doppler.  The wounds were irrigated hemostasis was obtained and we closed in layers of Vicryl and Monocryl.  Dermabond was placed at the skin level.  The patient was awakened from anesthesia having tolerated the procedure well without immediate complication.  All counts were correct at completion.  EBL: 100 cc  Reinhold Rickey C. Sheree, MD [facility] [place] Office: [phone] Pager: [phone]

## 2023-06-28 NOTE — Anesthesia Procedure Notes (Signed)
 Procedure Name: Intubation Date/Time: 06/28/2023 6:04 PM  Performed by: Delores Dus, CRNAPre-anesthesia Checklist: Patient identified, Emergency Drugs available, Suction available and Patient being monitored Patient Re-evaluated:Patient Re-evaluated prior to induction Oxygen Delivery Method: Circle system utilized Preoxygenation: Pre-oxygenation with 100% oxygen Induction Type: IV induction Ventilation: Mask ventilation without difficulty Laryngoscope Size: Miller and 2 Grade View: Grade II Tube type: Oral Tube size: 7.5 mm Number of attempts: 1 Airway Equipment and Method: Stylet and Oral airway Placement Confirmation: ETT inserted through vocal cords under direct vision, positive ETCO2 and breath sounds checked- equal and bilateral Secured at: 22 cm Tube secured with: Tape Dental Injury: Teeth and Oropharynx as per pre-operative assessment

## 2023-06-28 NOTE — Interval H&P Note (Signed)
 History and Physical Interval Note:  06/28/2023 2:43 PM  Brian Barrera  has presented today for surgery, with the diagnosis of ESRD.  The various methods of treatment have been discussed with the patient and family. After consideration of risks, benefits and other options for treatment, the patient has consented to  Procedure(s): EXCISION OF RIGHT ARTERIOVENOUS FISTULA PSEUDOANEURYSM (Right) LEFT ARM BASILIC VEIN TRANSPOSITION (Left) as a surgical intervention.  The patient's history has been reviewed, patient examined, no change in status, stable for surgery.  I have reviewed the patient's chart and labs.  Questions were answered to the patient's satisfaction.     [name]

## 2023-06-29 ENCOUNTER — Telehealth: Payer: Self-pay | Admitting: Vascular Surgery

## 2023-06-29 ENCOUNTER — Encounter (HOSPITAL_COMMUNITY): Payer: Self-pay | Admitting: Vascular Surgery

## 2023-06-29 ENCOUNTER — Telehealth: Payer: Self-pay | Admitting: Cardiology

## 2023-06-29 DIAGNOSIS — N186 End stage renal disease: Secondary | ICD-10-CM | POA: Diagnosis not present

## 2023-06-29 DIAGNOSIS — N2581 Secondary hyperparathyroidism of renal origin: Secondary | ICD-10-CM | POA: Diagnosis not present

## 2023-06-29 DIAGNOSIS — Z992 Dependence on renal dialysis: Secondary | ICD-10-CM | POA: Diagnosis not present

## 2023-06-29 LAB — T4, FREE: Free T4: 0.8 ng/dL (ref 0.8–1.8)

## 2023-06-29 LAB — TSH: TSH: 0.21 m[IU]/L — ABNORMAL LOW (ref 0.40–4.50)

## 2023-06-29 NOTE — Telephone Encounter (Signed)
 The patient has been notified of the result and verbalized understanding.  All questions (if any) were answered. Casper Clement, CMA 06/29/2023 1:11 PM

## 2023-06-29 NOTE — Telephone Encounter (Signed)
Patient called to follow-up on test results. 

## 2023-06-29 NOTE — Anesthesia Postprocedure Evaluation (Signed)
 Anesthesia Post Note  Patient: Brian Barrera  Procedure(s) Performed: EXCISION OF RIGHT ARTERIOVENOUS FISTULA PSEUDOANEURYSM (Right: Arm Upper) LEFT ARM BASILIC VEIN TRANSPOSITION (Left: Arm Lower)     Patient location during evaluation: PACU Anesthesia Type: General Level of consciousness: awake and alert Pain management: pain level controlled Vital Signs Assessment: post-procedure vital signs reviewed and stable Respiratory status: spontaneous breathing, nonlabored ventilation, respiratory function stable and patient connected to nasal cannula oxygen Cardiovascular status: blood pressure returned to baseline and stable Postop Assessment: no apparent nausea or vomiting Anesthetic complications: no   No notable events documented.        Lynwood MARLA Cornea

## 2023-06-29 NOTE — Telephone Encounter (Signed)
-----   Message from Dayna N Dunn sent at 06/29/2023  7:43 AM EST ----- Please let pt know TSH is low but free T4 is normal, question whether this represents subclinical hyperthyroidism - recommend he schedule appt with PCP to discuss plan and recheck. Fax labs to PCP. Thanks.

## 2023-06-30 DIAGNOSIS — I10 Essential (primary) hypertension: Secondary | ICD-10-CM | POA: Diagnosis not present

## 2023-06-30 DIAGNOSIS — I120 Hypertensive chronic kidney disease with stage 5 chronic kidney disease or end stage renal disease: Secondary | ICD-10-CM | POA: Diagnosis not present

## 2023-06-30 DIAGNOSIS — E7849 Other hyperlipidemia: Secondary | ICD-10-CM | POA: Diagnosis not present

## 2023-06-30 DIAGNOSIS — Z1329 Encounter for screening for other suspected endocrine disorder: Secondary | ICD-10-CM | POA: Diagnosis not present

## 2023-07-01 DIAGNOSIS — N186 End stage renal disease: Secondary | ICD-10-CM | POA: Diagnosis not present

## 2023-07-01 DIAGNOSIS — N2581 Secondary hyperparathyroidism of renal origin: Secondary | ICD-10-CM | POA: Diagnosis not present

## 2023-07-01 DIAGNOSIS — Z992 Dependence on renal dialysis: Secondary | ICD-10-CM | POA: Diagnosis not present

## 2023-07-04 ENCOUNTER — Telehealth: Payer: Self-pay

## 2023-07-04 DIAGNOSIS — N2581 Secondary hyperparathyroidism of renal origin: Secondary | ICD-10-CM | POA: Diagnosis not present

## 2023-07-04 DIAGNOSIS — N186 End stage renal disease: Secondary | ICD-10-CM | POA: Diagnosis not present

## 2023-07-04 DIAGNOSIS — Z992 Dependence on renal dialysis: Secondary | ICD-10-CM | POA: Diagnosis not present

## 2023-07-04 NOTE — Telephone Encounter (Signed)
 Caller: Kavin Parsley at [facility]  Concern: Swelling, warmth, redness, pain, numbness & tingling distal to access site.  No drainage, given 2 gms Cefazolin  at HD on 07/04/23  Location: left arm  Description:  Increasing in sx's since Friday, 07/01/23  Procedure: Dialysis Access Surgery  Resolution: Appointment scheduled for next available PA appt in [place]  Next Appt: Appointment scheduled for 07/05/23 @ 0800 for US  and 0900 for PA

## 2023-07-05 ENCOUNTER — Ambulatory Visit (INDEPENDENT_AMBULATORY_CARE_PROVIDER_SITE_OTHER): Payer: 59 | Admitting: Physician Assistant

## 2023-07-05 ENCOUNTER — Other Ambulatory Visit (INDEPENDENT_AMBULATORY_CARE_PROVIDER_SITE_OTHER): Payer: 59

## 2023-07-05 VITALS — BP 113/76 | HR 74 | Ht 72.0 in | Wt 276.0 lb

## 2023-07-05 DIAGNOSIS — N186 End stage renal disease: Secondary | ICD-10-CM | POA: Diagnosis not present

## 2023-07-05 DIAGNOSIS — Z992 Dependence on renal dialysis: Secondary | ICD-10-CM

## 2023-07-05 NOTE — Progress Notes (Signed)
POST OPERATIVE OFFICE NOTE    CC:  F/u for surgery  HPI:  This is a 53 y.o. male who is s/p excision of right forearm fistula pseudoaneurysm as well as left second stage basilic vein transposition by Dr. Randie Heinz on 06/28/2023.  He returns to clinic today with complaints of numbness in the left forearm and swelling at the surgery site of the left arm.  He denies any steal symptoms in the left hand.  He believes the swelling of the arm has been slightly improving over the last 24 hours.  He believes the incisions are healing well.  He continues to dialyze via right IJ [facility] on a Monday Wednesday Friday schedule at the kidney center in [place].  He denies fevers and chills.  No Known Allergies  Current Outpatient Medications  Medication Sig Dispense Refill   albuterol (ACCUNEB) 1.25 MG/3ML nebulizer solution Take 1 ampule by nebulization every 4 (four) hours as needed for wheezing or shortness of breath.     aspirin EC 81 MG tablet Take 81 mg by mouth in the morning.     atorvastatin (LIPITOR) 10 MG tablet Take 10 mg by mouth every evening.     AURYXIA 1 GM 210 MG(Fe) tablet Take 420 mg by mouth 3 (three) times daily with meals. And 420 mg with each snack     cinacalcet (SENSIPAR) 60 MG tablet Take 60 mg by mouth every Monday, Wednesday, and Friday with hemodialysis.     diphenhydramine-acetaminophen (TYLENOL PM) 25-500 MG TABS tablet Take 1 tablet by mouth at bedtime as needed (sleep).     DULoxetine (CYMBALTA) 60 MG capsule Take 60 mg by mouth in the morning.     lidocaine-prilocaine (EMLA) cream Apply 1 Application topically daily as needed (fistula acces).     metoprolol tartrate (LOPRESSOR) 25 MG tablet Take 3 tablets (75 mg total) by mouth 2 (two) times daily. 180 tablet 11   midodrine (PROAMATINE) 5 MG tablet Take 5 mg by mouth every Monday, Wednesday, and Friday.     Multiple Vitamins-Minerals (MULTIVITAMIN WITH MINERALS) tablet Take 1 tablet by mouth daily. Men 50+     Omega-3 1000 MG CAPS  Take 2,000 mg by mouth daily.     oxyCODONE-acetaminophen (PERCOCET) 10-325 MG tablet Take 1 tablet by mouth every 6 (six) hours as needed.     pregabalin (LYRICA) 75 MG capsule Take 75 mg by mouth 2 (two) times daily.     sevelamer carbonate (RENVELA) 800 MG tablet Take 1,600-3,200 mg by mouth See admin instructions. Take 3200 mg with meals and 1600 with snack     sildenafil (REVATIO) 20 MG tablet Take 40-60 mg by mouth daily as needed.     SYMBICORT 160-4.5 MCG/ACT inhaler Inhale 2 puffs into the lungs 2 (two) times daily as needed (shortness of breath).     No current facility-administered medications for this visit.     ROS:  See HPI  Physical Exam:  Vitals:   07/05/23 1232  BP: 113/76  Pulse: 74  Weight: 276 lb (125.2 kg)  Height: 6' (1.829 m)    Incision: Right forearm incision clean, dry, intact; left arm incisions well-appearing without dehiscence Extremities: Palpable thrill through the fistula in the left arm; palpable left radial pulse Neuro: A&O  Assessment/Plan:  This is a 53 y.o. male who is s/p: Excision of thrombosed right forearm fistula as well as left second stage basilic vein fistula  Mr. Sandoz has been experiencing numbness in his left forearm since surgery.  I explained to them that this is common after a second stage basilic vein transposition due to the amount of dissection in the upper arm around the nerves.  He does not have any signs or symptoms of steal syndrome.  He also has a palpable left radial pulse.  Left arm fistula is patent with a palpable thrill.  He will continue HD via right IJ TDC for another 3 weeks.  Okay to access left arm fistula on March 5.  For now he will follow-up on an as needed basis.   Emilie Rutter, PA-C [facility] [phone]  Clinic MD:  Randie Heinz on call

## 2023-07-06 DIAGNOSIS — Z992 Dependence on renal dialysis: Secondary | ICD-10-CM | POA: Diagnosis not present

## 2023-07-06 DIAGNOSIS — N2581 Secondary hyperparathyroidism of renal origin: Secondary | ICD-10-CM | POA: Diagnosis not present

## 2023-07-06 DIAGNOSIS — N186 End stage renal disease: Secondary | ICD-10-CM | POA: Diagnosis not present

## 2023-07-06 NOTE — Telephone Encounter (Signed)
Pt app has been scheduled

## 2023-07-07 ENCOUNTER — Other Ambulatory Visit: Payer: Self-pay

## 2023-07-07 DIAGNOSIS — Z5321 Procedure and treatment not carried out due to patient leaving prior to being seen by health care provider: Secondary | ICD-10-CM | POA: Insufficient documentation

## 2023-07-07 DIAGNOSIS — E782 Mixed hyperlipidemia: Secondary | ICD-10-CM | POA: Diagnosis not present

## 2023-07-07 DIAGNOSIS — I120 Hypertensive chronic kidney disease with stage 5 chronic kidney disease or end stage renal disease: Secondary | ICD-10-CM | POA: Diagnosis not present

## 2023-07-07 DIAGNOSIS — Z992 Dependence on renal dialysis: Secondary | ICD-10-CM | POA: Diagnosis not present

## 2023-07-07 DIAGNOSIS — I10 Essential (primary) hypertension: Secondary | ICD-10-CM | POA: Diagnosis not present

## 2023-07-07 DIAGNOSIS — T8242XA Displacement of vascular dialysis catheter, initial encounter: Secondary | ICD-10-CM | POA: Diagnosis not present

## 2023-07-07 DIAGNOSIS — Z0001 Encounter for general adult medical examination with abnormal findings: Secondary | ICD-10-CM | POA: Diagnosis not present

## 2023-07-07 NOTE — ED Triage Notes (Signed)
Patient present to the ED POV states his dialysis catheter "fell out" two hours ago,  stated "had trouble with it for two weeks"

## 2023-07-08 ENCOUNTER — Ambulatory Visit (HOSPITAL_COMMUNITY)
Admission: RE | Admit: 2023-07-08 | Discharge: 2023-07-08 | Disposition: A | Discharge status: Home / Self Care | Payer: 59 | Source: Ambulatory Visit | Attending: Nurse Practitioner | Admitting: Nurse Practitioner

## 2023-07-08 ENCOUNTER — Encounter (HOSPITAL_COMMUNITY): Payer: Self-pay

## 2023-07-08 ENCOUNTER — Other Ambulatory Visit (HOSPITAL_COMMUNITY): Payer: Self-pay | Admitting: Nurse Practitioner

## 2023-07-08 ENCOUNTER — Other Ambulatory Visit: Payer: Self-pay

## 2023-07-08 ENCOUNTER — Emergency Department
Admission: EM | Admit: 2023-07-08 | Discharge: 2023-07-08 | Discharge status: Against Medical Advice | Payer: 59 | Attending: Emergency Medicine | Admitting: Emergency Medicine

## 2023-07-08 DIAGNOSIS — D631 Anemia in chronic kidney disease: Secondary | ICD-10-CM | POA: Insufficient documentation

## 2023-07-08 DIAGNOSIS — I132 Hypertensive heart and chronic kidney disease with heart failure and with stage 5 chronic kidney disease, or end stage renal disease: Secondary | ICD-10-CM | POA: Insufficient documentation

## 2023-07-08 DIAGNOSIS — I5032 Chronic diastolic (congestive) heart failure: Secondary | ICD-10-CM | POA: Insufficient documentation

## 2023-07-08 DIAGNOSIS — N186 End stage renal disease: Secondary | ICD-10-CM | POA: Diagnosis not present

## 2023-07-08 DIAGNOSIS — F17210 Nicotine dependence, cigarettes, uncomplicated: Secondary | ICD-10-CM | POA: Diagnosis not present

## 2023-07-08 DIAGNOSIS — Z992 Dependence on renal dialysis: Secondary | ICD-10-CM | POA: Diagnosis not present

## 2023-07-08 HISTORY — PX: IR FLUORO GUIDE CV LINE RIGHT: IMG2283

## 2023-07-08 HISTORY — PX: IR US GUIDE VASC ACCESS RIGHT: IMG2390

## 2023-07-08 MED ORDER — FENTANYL CITRATE (PF) 100 MCG/2ML IJ SOLN
INTRAMUSCULAR | Status: AC
  Filled 2023-07-08: qty 2

## 2023-07-08 MED ORDER — CEFAZOLIN SODIUM-DEXTROSE 2-4 GM/100ML-% IV SOLN
INTRAVENOUS | Status: AC
  Filled 2023-07-08: qty 100

## 2023-07-08 MED ORDER — HEPARIN SODIUM (PORCINE) 1000 UNIT/ML IJ SOLN
INTRAMUSCULAR | Status: AC
  Filled 2023-07-08: qty 10

## 2023-07-08 MED ORDER — MIDAZOLAM HCL 2 MG/2ML IJ SOLN
INTRAMUSCULAR | Status: AC
  Filled 2023-07-08: qty 2

## 2023-07-08 MED ORDER — MIDAZOLAM HCL 2 MG/2ML IJ SOLN
INTRAMUSCULAR | Status: AC | PRN
  Administered 2023-07-08: .5 mg via INTRAVENOUS

## 2023-07-08 MED ORDER — LIDOCAINE-EPINEPHRINE 1 %-1:100000 IJ SOLN
20.0000 mL | Freq: Once | INTRAMUSCULAR | Status: AC
  Administered 2023-07-08: 20 mL via INTRADERMAL

## 2023-07-08 MED ORDER — IOHEXOL 300 MG/ML  SOLN
50.0000 mL | Freq: Once | INTRAMUSCULAR | Status: AC | PRN
  Administered 2023-07-08: 15 mL via INTRAVENOUS

## 2023-07-08 MED ORDER — LIDOCAINE-EPINEPHRINE 1 %-1:100000 IJ SOLN
INTRAMUSCULAR | Status: AC
  Filled 2023-07-08: qty 1

## 2023-07-08 MED ORDER — CEFAZOLIN SODIUM-DEXTROSE 2-4 GM/100ML-% IV SOLN
2.0000 g | INTRAVENOUS | Status: AC
  Administered 2023-07-08: 2 g via INTRAVENOUS

## 2023-07-08 NOTE — H&P (Signed)
 Chief Complaint: tunneled hemodialysis catheter fell out - image guided hemodialysis catheter placement   Referring Provider(s): Hyler, Janine Limbo   Supervising Physician: Roanna Banning  Patient Status: [facility] - Out-pt  History of Present Illness: Brian Barrera is a 53 y.o. male with a history of anemia, CKD, HTN, and ESRD on dialysis.  Pt is known to [place] from a tunneled hemodialysis catheter exchange on 04/04/23 with Dr. Milford Cage.  The port was working well until it fell out.  Pt was referred to interventional radiology for a tunneled hemodialysis catheter placement.    Patient is Full Code  Past Medical History:  Diagnosis Date   Anemia    Attributed to chronic disease/chronic kidney disease   Asthma    CHF (congestive heart failure) (HCC)    Chronic diastolic heart failure (HCC) 05/24/2009   EF 35% by echo in 2011; echo in 08/2011-normal EF, moderate to severe LVH; BNP level of [phone] in 2011-12   Chronic obstructive pulmonary disease (HCC)    with asthmatic component   Dysrhythmia    ESRD (end stage renal disease) on dialysis (HCC)    secondary hyperparathyroidism   Hypertension    h/o hypertensive crisis with encephalopathy   Obesity    Tobacco abuse    Approximate consumption of 25 pack years; continuing at 0.5 pack per day    Past Surgical History:  Procedure Laterality Date   ARTERIOVENOUS GRAFT PLACEMENT  2011   AV FISTULA PLACEMENT Left 05/03/2023   Procedure: LEFT ARM BASILIC VEIN FISTULA CREATION;  Surgeon: Maeola Harman, MD;  Location: [facility] OR;  Service: Vascular;  Laterality: Left;   BASCILIC VEIN TRANSPOSITION Left 06/28/2023   Procedure: LEFT ARM BASILIC VEIN TRANSPOSITION;  Surgeon: Maeola Harman, MD;  Location: [facility] OR;  Service: Vascular;  Laterality: Left;   COLONOSCOPY N/A 02/04/2014   Procedure: COLONOSCOPY;  Surgeon: Corbin Ade, MD;  Location: [facility];  Service: Endoscopy;  Laterality: N/A;  12:00   COLONOSCOPY N/A  07/03/2019   Procedure: COLONOSCOPY;  Surgeon: Corbin Ade, MD;  Location: [facility];  Service: Endoscopy;  Laterality: N/A;  12:00-office rescheduled 2/9 @ 11:00am   INSERTION OF DIALYSIS CATHETER Right 03/01/2023   Procedure: INSERTION OF RIGHT INTERNAL JUGULAR TUNNELED DIALYSIS CATHETER;  Surgeon: Maeola Harman, MD;  Location: [facility] OR;  Service: Vascular;  Laterality: Right;   IR FLUORO GUIDE CV LINE RIGHT  04/04/2023   POLYPECTOMY  07/03/2019   Procedure: POLYPECTOMY;  Surgeon: Corbin Ade, MD;  Location: [facility];  Service: Endoscopy;;   REVISON OF ARTERIOVENOUS FISTULA Right 01/25/2023   Procedure: REVISION OF RIGHT ARM ARTERIOVENOUS FISTULA;  Surgeon: Maeola Harman, MD;  Location: [facility] OR;  Service: Vascular;  Laterality: Right;   REVISON OF ARTERIOVENOUS FISTULA Right 03/01/2023   Procedure: INSERTION OF ARTERIOVENOUS GRAFT USING ARTEGRAFT  X 42CM;  Surgeon: Maeola Harman, MD;  Location: [facility] OR;  Service: Vascular;  Laterality: Right;   REVISON OF ARTERIOVENOUS FISTULA Right 06/28/2023   Procedure: EXCISION OF RIGHT ARTERIOVENOUS FISTULA PSEUDOANEURYSM;  Surgeon: Maeola Harman, MD;  Location: [facility] OR;  Service: Vascular;  Laterality: Right;   THROMBECTOMY W/ EMBOLECTOMY  01/25/2023   Procedure: THROMBECTOMY RIGHT ARTERIOVENOUS FISTULA WITH BALLOON ANGIOPLASTY;  Surgeon: Maeola Harman, MD;  Location: [facility] OR;  Service: Vascular;;   UPPER EXTREMITY VENOGRAPHY Bilateral 04/07/2023   Procedure: UPPER EXTREMITY VENOGRAPHY;  Surgeon: Cephus Shelling, MD;  Location: [facility] INVASIVE CV LAB;  Service: Cardiovascular;  Laterality: Bilateral;   VENOGRAM Right 01/25/2023   Procedure: RIGHT ARM FISTULOGRAM;  Surgeon: Maeola Harman, MD;  Location: [facility] OR;  Service: Vascular;  Laterality: Right;    Allergies: Patient has no known allergies.  Medications: Prior to Admission medications   Medication Sig Start Date End Date Taking?  Authorizing Provider  albuterol (ACCUNEB) 1.25 MG/3ML nebulizer solution Take 1 ampule by nebulization every 4 (four) hours as needed for wheezing or shortness of breath.    [provider]  aspirin EC 81 MG tablet Take 81 mg by mouth in the morning.    [provider]  atorvastatin (LIPITOR) 10 MG tablet Take 10 mg by mouth every evening.    [provider]  AURYXIA 1 GM 210 MG(Fe) tablet Take 420 mg by mouth 3 (three) times daily with meals. And 420 mg with each snack 11/02/16   [provider]  cinacalcet (SENSIPAR) 60 MG tablet Take 60 mg by mouth every Monday, Wednesday, and Friday with hemodialysis.    [provider]  diphenhydramine-acetaminophen (TYLENOL PM) 25-500 MG TABS tablet Take 1 tablet by mouth at bedtime as needed (sleep).    [provider]  DULoxetine (CYMBALTA) 60 MG capsule Take 60 mg by mouth in the morning. 11/08/19   [provider]  lidocaine-prilocaine (EMLA) cream Apply 1 Application topically daily as needed (fistula acces). 03/11/23   [provider]  metoprolol tartrate (LOPRESSOR) 25 MG tablet Take 3 tablets (75 mg total) by mouth 2 (two) times daily. 06/27/23 09/25/23  Laurann Montana, PA-C  midodrine (PROAMATINE) 5 MG tablet Take 5 mg by mouth every Monday, Wednesday, and Friday. 11/07/22   [provider]  Multiple Vitamins-Minerals (MULTIVITAMIN WITH MINERALS) tablet Take 1 tablet by mouth daily. Men 50+    [provider]  Omega-3 1000 MG CAPS Take 2,000 mg by mouth daily.    [provider]  oxyCODONE-acetaminophen (PERCOCET) 10-325 MG tablet Take 1 tablet by mouth every 6 (six) hours as needed. 07/04/23   [provider]  pregabalin (LYRICA) 75 MG capsule Take 75 mg by mouth 2 (two) times daily. 12/13/22   [provider]  sevelamer carbonate (RENVELA) 800 MG tablet Take 1,600-3,200 mg by mouth See admin instructions. Take 3200 mg with meals and 1600 with  snack    [provider]  sildenafil (REVATIO) 20 MG tablet Take 40-60 mg by mouth daily as needed. 01/13/23   [provider]  SYMBICORT 160-4.5 MCG/ACT inhaler Inhale 2 puffs into the lungs 2 (two) times daily as needed (shortness of breath). 09/28/22   [provider]     Family History  Problem Relation Age of Onset   Heart disease Father    Diabetes Sister    Hypertension Sister    Diabetes Sister    Hypertension Sister    Kidney disease Mother    Hypertension Mother    Hypertension Sister    Hypertension Brother    Hypertension Brother    Hypertension Brother    Colon cancer Neg Hx     Social History   Socioeconomic History   Marital status: Single    Spouse name: Not on file   Number of children: 3   Years of education: Not on file   Highest education level: Not on file  Occupational History   Occupation: Disability  Tobacco Use   Smoking status: Every Day    Current packs/day: 0.50    Average packs/day: 0.5  packs/day for 37.8 years (18.9 ttl pk-yrs)    Types: Cigarettes    Start date: 09/23/1985   Smokeless tobacco: Never   Tobacco comments:    8-10 ciggs per day   Vaping Use   Vaping status: Never Used  Substance and Sexual Activity   Alcohol use: Not Currently    Comment: quit etoh about 2011. used to drink about 8-10 beers a week   Drug use: No   Sexual activity: Not on file  Other Topics Concern   Not on file  Social History Narrative   Not on file   Social Drivers of Health   Financial Resource Strain: Not on file  Food Insecurity: Not on file  Transportation Needs: Not on file  Physical Activity: Not on file  Stress: Not on file  Social Connections: Not on file     Review of Systems: A 12 point ROS discussed and pertinent positives are indicated in the HPI above.  All other systems are negative.  Review of Systems  Constitutional:  Negative for fatigue and fever.  HENT:  Negative for congestion and dental problem.    Respiratory:  Negative for cough, chest tightness, shortness of breath and wheezing.   Cardiovascular:  Negative for chest pain.  Gastrointestinal:  Negative for diarrhea, nausea and vomiting.  Neurological:  Negative for light-headedness and headaches.  Psychiatric/Behavioral:  Negative for agitation, behavioral problems and confusion.     Vital Signs: BP 96/68   Pulse 76   Temp 99.1 F (37.3 C) (Oral)   Resp 16   Ht 6' (1.829 m)   Wt 270 lb (122.5 kg)   SpO2 96%   BMI 36.62 kg/m   Advance Care Plan: The advanced care place/surrogate decision maker was discussed at the time of visit and the patient did not wish to discuss or was not able to name a surrogate decision maker or provide an advance care plan.  Physical Exam Constitutional:      Appearance: Normal appearance.  HENT:     Head: Normocephalic and atraumatic.     Mouth/Throat:     Mouth: Mucous membranes are moist.  Cardiovascular:     Rate and Rhythm: Normal rate and regular rhythm.  Pulmonary:     Effort: Pulmonary effort is normal.     Breath sounds: Normal breath sounds.  Abdominal:     General: Bowel sounds are normal.     Palpations: Abdomen is soft.  Musculoskeletal:        General: Normal range of motion.     Cervical back: Normal range of motion.  Skin:    General: Skin is warm and dry.  Neurological:     Mental Status: He is alert and oriented to person, place, and time.  Psychiatric:        Mood and Affect: Mood normal.        Behavior: Behavior normal.     Imaging: VAS [place] STEAL EXAM Result Date: 07/05/2023 DIALYSIS STEAL  Patient Name:  ANTONY SIAN  Date of Exam:   07/05/2023 Medical Rec #: [id number]          Accession #:    [id number] Date of Birth: Feb 01, 1971           Patient Gender: M Patient Age:   74 years Exam Location:  [facility] Procedure:      VAS [place] STEAL EXAM Referring Phys: Lemar Livings  --------------------------------------------------------------------------------  Reason for Exam: Numbness, Tingling and swelling. Access Site: Left  Upper Extremity. Access Type: Basilic vein transposition. History: 06/28/23: Revision left upper arm basilic vein AV fistula with          transposition          Hx Bilat AVF failure. Performing Technologist: Thereasa Parkin RVT  Examination Guidelines: A complete evaluation includes B-mode imaging, spectral Doppler, color Doppler, and power Doppler as needed of all accessible portions of each vessel. Bilateral testing is considered an integral part of a complete examination. Limited examinations for reoccurring indications may be performed as noted.  Findings: +-----------------+-------------+----------+---------+----------+--------------+ Upper Arm        Diameter (cm)Depth (cm)BranchingPSV (cm/s) Flow Volume   Arteriovenous                                                 (ml/min)    Fistula                                                                   +-----------------+-------------+----------+---------+----------+--------------+ Distal Brachial                                     145         1063      Artery                                                                    +-----------------+-------------+----------+---------+----------+--------------+ AVF anastomosis                                     254                   +-----------------+-------------+----------+---------+----------+--------------+ just after                                          156                   anastomosis                                                               +-----------------+-------------+----------+---------+----------+--------------+ outflow vein mid                                    250                   upper arm                                                                  +-----------------+-------------+----------+---------+----------+--------------+  +---------------------------+-----+-------+------------------------------------+  RightLeft   Comments                             +---------------------------+-----+-------+------------------------------------+ Brachial                                                                    +---------------------------+-----+-------+------------------------------------+ Radial Ambient                  60 mmHgtriphasic                            +---------------------------+-----+-------+------------------------------------+ Radial AV Compression                  Image lost / minimal increase with                                          compression.                         +---------------------------+-----+-------+------------------------------------+ Ulnar Ambient                                                               +---------------------------+-----+-------+------------------------------------+ Ulnar AV Compression                                                        +---------------------------+-----+-------+------------------------------------+ 2nd Digit Ambient                                                           +---------------------------+-----+-------+------------------------------------+ 2nd Digit Ulnar Compression                                                 +---------------------------+-----+-------+------------------------------------+  Summary: Patent AVF. Flow volume 1063 ml/min. Minimal increase in radial artery velocity with AVF compression, however, the image was lost. Hematoma at the anastomosis and in the mid upper arm. *See table(s) above for measurements and observations. Diagnosing physician: Sherald Hess MD Electronically signed by Sherald Hess MD on 07/05/2023 at 2:13:12 PM.    Final    DG Chest Portable 1  View Result Date: 06/20/2023 CLINICAL DATA:  Shortness of breath. EXAM: PORTABLE CHEST 1 VIEW COMPARISON:  03/01/2023. FINDINGS: Low lung volume. There is mild central vascular congestion, which is likely accentuated by low lung volume. No frank pulmonary edema. Bilateral lung fields are clear. Bilateral costophrenic angles are clear.  Stable cardio-mediastinal silhouette. No acute osseous abnormalities. The soft tissues are within normal limits. Right IJ hemodialysis catheter noted with its tip overlying the cavoatrial junction region. IMPRESSION: *Mild central vascular congestion, which is likely accentuated by low lung volume. *Otherwise, no acute cardiopulmonary abnormality. Electronically Signed   By: Jules Schick M.D.   On: 06/20/2023 08:53   VAS US DUPLEX DIALYSIS ACCESS (AVF, AVG) Result Date: 06/16/2023 DIALYSIS ACCESS Patient Name:  KINSLER SOEDER  Date of Exam:   06/15/2023 Medical Rec #: [id number]          Accession #:    [id number] Date of Birth: 05-Sep-1970           Patient Gender: M Patient Age:   50 years Exam Location:  [facility] Procedure:      VAS US DUPLEX DIALYSIS ACCESS (AVF, AVG) Referring Phys: Lemar Livings --------------------------------------------------------------------------------  Reason for Exam: Routine follow up. Access Site: Left Upper Extremity. Access Type: Brachial-basilic AVF. History: S/P Left AVF creation, Hx Bilat AVF failure. Performing Technologist: Criss Rosales RVT  Examination Guidelines: A complete evaluation includes B-mode imaging, spectral Doppler, color Doppler, and power Doppler as needed of all accessible portions of each vessel. Unilateral testing is considered an integral part of a complete examination. Limited examinations for reoccurring indications may be performed as noted.  Findings: +--------------------+----------+-----------------+--------+ AVF                 PSV (cm/s)Flow Vol (mL/min)Comments  +--------------------+----------+-----------------+--------+ Native artery inflow   252          1204                +--------------------+----------+-----------------+--------+ AVF Anastomosis        656                              +--------------------+----------+-----------------+--------+  +------------+----------+-------------+----------+--------+ OUTFLOW VEINPSV (cm/s)Diameter (cm)Depth (cm)Describe +------------+----------+-------------+----------+--------+ Shoulder       107        0.74        2.60            +------------+----------+-------------+----------+--------+ Prox UA        127        0.76        1.80            +------------+----------+-------------+----------+--------+ Mid UA         170        0.62        1.60            +------------+----------+-------------+----------+--------+ Dist UA        291        0.64        1.20            +------------+----------+-------------+----------+--------+ AC Fossa       755        0.37        1.40            +------------+----------+-------------+----------+--------+  Summary: - Patent left arm brachial-basilic arterio-venous fistula. - Flow volume: 1204 mL/min. - Elevated velocities at the [facility] fossa, suggestive of extrinsic compression via hematoma/swelling.  *See table(s) above for measurements and observations.  Diagnosing physician: Lemar Livings MD Electronically signed by Lemar Livings MD on 06/16/2023 at 8:35:19 AM.    --------------------------------------------------------------------------------   Final     Labs:  CBC: Recent Labs    05/30/23 0627 06/20/23 0900 06/28/23 1327  06/28/23 1332  WBC 9.8 7.6  --   --   HGB 15.5 15.2 17.7* 17.7*  HCT 50.4 51.6 52.0 52.0  PLT 287 171  --   --     COAGS: No results for input(s): "INR", "APTT" in the last 8760 hours.  BMP: Recent Labs    05/30/23 0627 06/20/23 0900 06/28/23 1327 06/28/23 1332 06/28/23 1337  NA 135 137 136 135 137  K 4.9  5.3* 6.0* 6.0* 6.1*  CL 99 100 103 104 98  CO2 20* 23  --   --  20*  GLUCOSE 130* 86 86 85 84  BUN 62* 64* 50* 50* 52*  CALCIUM 9.1 9.1  --   --  9.0  CREATININE 14.27* 16.26* 14.20* 14.40* 12.88*  GFRNONAA 4* 3*  --   --  4*    LIVER FUNCTION TESTS: No results for input(s): "BILITOT", "AST", "ALT", "ALKPHOS", "PROT", "ALBUMIN" in the last 8760 hours.  TUMOR MARKERS: No results for input(s): "AFPTM", "CEA", "CA199", "CHROMGRNA" in the last 8760 hours.  Assessment and Plan:  Pt with ESRD and on hemodialysis, pt is known to [place] from a right tunneled central venous catheter exchange with Dr. Milford Cage 04/04/23; hemodialysis catheter fell out.  Pt scheduled for image guided tunneled central venous hemodialysis catheter 07/08/23.    Risks and benefits discussed with the patient including, but not limited to bleeding, infection, vascular injury, pneumothorax which may require chest tube placement, air embolism or even death  All of the patient's questions were answered, patient is agreeable to proceed. Consent signed and in chart.  Thank you for allowing our service to participate in THEADOR JEZEWSKI 's care.  Electronically Signed: Loman Brooklyn, PA-C   07/08/2023, 12:38 PM    I spent a total of    25 Minutes in face to face in clinical consultation, greater than 50% of which was counseling/coordinating care for image guided tunneled central venous hemodialysis catheter.

## 2023-07-08 NOTE — Sedation Documentation (Signed)
02 d/c

## 2023-07-08 NOTE — Procedures (Signed)
Vascular and Interventional Radiology Procedure Note  Patient: Brian Barrera DOB: [birth year] Medical Record Number: [id number] Note Date/Time: 07/08/23 1:23 PM   Performing Physician: Roanna Banning, MD Assistant(s): None  Diagnosis: ESRD requiring Hemodialysis  Procedure: TUNNELED HEMODIALYSIS CATHETER PLACEMENT  Anesthesia: Conscious Sedation Complications: None Estimated Blood Loss: Minimal Specimens:  None  Findings:  Successful placement of right-sided, 23 cm (tip-to-cuff), tunneled hemodialysis catheter with the tip of the catheter in the proximal right atrium.  Plan: Catheter ready for use.  See detailed procedure note with images in PACS. The patient tolerated the procedure well without incident or complication and was returned to Recovery in stable condition.    Roanna Banning, MD [facility] Radiology   Pager. [phone] Clinic. [phone]

## 2023-07-09 DIAGNOSIS — Z992 Dependence on renal dialysis: Secondary | ICD-10-CM | POA: Diagnosis not present

## 2023-07-09 DIAGNOSIS — N2581 Secondary hyperparathyroidism of renal origin: Secondary | ICD-10-CM | POA: Diagnosis not present

## 2023-07-09 DIAGNOSIS — N186 End stage renal disease: Secondary | ICD-10-CM | POA: Diagnosis not present

## 2023-07-11 DIAGNOSIS — N2581 Secondary hyperparathyroidism of renal origin: Secondary | ICD-10-CM | POA: Diagnosis not present

## 2023-07-11 DIAGNOSIS — N186 End stage renal disease: Secondary | ICD-10-CM | POA: Diagnosis not present

## 2023-07-11 DIAGNOSIS — Z992 Dependence on renal dialysis: Secondary | ICD-10-CM | POA: Diagnosis not present

## 2023-07-12 DIAGNOSIS — N186 End stage renal disease: Secondary | ICD-10-CM | POA: Diagnosis not present

## 2023-07-12 DIAGNOSIS — N2581 Secondary hyperparathyroidism of renal origin: Secondary | ICD-10-CM | POA: Diagnosis not present

## 2023-07-12 DIAGNOSIS — Z992 Dependence on renal dialysis: Secondary | ICD-10-CM | POA: Diagnosis not present

## 2023-07-15 DIAGNOSIS — Z992 Dependence on renal dialysis: Secondary | ICD-10-CM | POA: Diagnosis not present

## 2023-07-15 DIAGNOSIS — N186 End stage renal disease: Secondary | ICD-10-CM | POA: Diagnosis not present

## 2023-07-15 DIAGNOSIS — N2581 Secondary hyperparathyroidism of renal origin: Secondary | ICD-10-CM | POA: Diagnosis not present

## 2023-07-18 DIAGNOSIS — Z992 Dependence on renal dialysis: Secondary | ICD-10-CM | POA: Diagnosis not present

## 2023-07-18 DIAGNOSIS — N2581 Secondary hyperparathyroidism of renal origin: Secondary | ICD-10-CM | POA: Diagnosis not present

## 2023-07-18 DIAGNOSIS — N186 End stage renal disease: Secondary | ICD-10-CM | POA: Diagnosis not present

## 2023-07-20 DIAGNOSIS — Z992 Dependence on renal dialysis: Secondary | ICD-10-CM | POA: Diagnosis not present

## 2023-07-20 DIAGNOSIS — N186 End stage renal disease: Secondary | ICD-10-CM | POA: Diagnosis not present

## 2023-07-20 DIAGNOSIS — N2581 Secondary hyperparathyroidism of renal origin: Secondary | ICD-10-CM | POA: Diagnosis not present

## 2023-07-22 DIAGNOSIS — N2581 Secondary hyperparathyroidism of renal origin: Secondary | ICD-10-CM | POA: Diagnosis not present

## 2023-07-22 DIAGNOSIS — N186 End stage renal disease: Secondary | ICD-10-CM | POA: Diagnosis not present

## 2023-07-22 DIAGNOSIS — Z992 Dependence on renal dialysis: Secondary | ICD-10-CM | POA: Diagnosis not present

## 2023-07-25 DIAGNOSIS — N186 End stage renal disease: Secondary | ICD-10-CM | POA: Diagnosis not present

## 2023-07-25 DIAGNOSIS — Z992 Dependence on renal dialysis: Secondary | ICD-10-CM | POA: Diagnosis not present

## 2023-07-25 DIAGNOSIS — N2581 Secondary hyperparathyroidism of renal origin: Secondary | ICD-10-CM | POA: Diagnosis not present

## 2023-07-27 DIAGNOSIS — Z992 Dependence on renal dialysis: Secondary | ICD-10-CM | POA: Diagnosis not present

## 2023-07-27 DIAGNOSIS — N186 End stage renal disease: Secondary | ICD-10-CM | POA: Diagnosis not present

## 2023-07-27 DIAGNOSIS — N2581 Secondary hyperparathyroidism of renal origin: Secondary | ICD-10-CM | POA: Diagnosis not present

## 2023-07-29 DIAGNOSIS — N186 End stage renal disease: Secondary | ICD-10-CM | POA: Diagnosis not present

## 2023-07-29 DIAGNOSIS — Z992 Dependence on renal dialysis: Secondary | ICD-10-CM | POA: Diagnosis not present

## 2023-07-29 DIAGNOSIS — N2581 Secondary hyperparathyroidism of renal origin: Secondary | ICD-10-CM | POA: Diagnosis not present

## 2023-08-01 DIAGNOSIS — N186 End stage renal disease: Secondary | ICD-10-CM | POA: Diagnosis not present

## 2023-08-01 DIAGNOSIS — Z992 Dependence on renal dialysis: Secondary | ICD-10-CM | POA: Diagnosis not present

## 2023-08-01 DIAGNOSIS — N2581 Secondary hyperparathyroidism of renal origin: Secondary | ICD-10-CM | POA: Diagnosis not present

## 2023-08-02 DIAGNOSIS — M25569 Pain in unspecified knee: Secondary | ICD-10-CM | POA: Diagnosis not present

## 2023-08-02 DIAGNOSIS — M25511 Pain in right shoulder: Secondary | ICD-10-CM | POA: Diagnosis not present

## 2023-08-02 DIAGNOSIS — M25512 Pain in left shoulder: Secondary | ICD-10-CM | POA: Diagnosis not present

## 2023-08-02 DIAGNOSIS — M542 Cervicalgia: Secondary | ICD-10-CM | POA: Diagnosis not present

## 2023-08-02 DIAGNOSIS — M549 Dorsalgia, unspecified: Secondary | ICD-10-CM | POA: Diagnosis not present

## 2023-08-02 DIAGNOSIS — Z79891 Long term (current) use of opiate analgesic: Secondary | ICD-10-CM | POA: Diagnosis not present

## 2023-08-02 DIAGNOSIS — G894 Chronic pain syndrome: Secondary | ICD-10-CM | POA: Diagnosis not present

## 2023-08-03 DIAGNOSIS — Z992 Dependence on renal dialysis: Secondary | ICD-10-CM | POA: Diagnosis not present

## 2023-08-03 DIAGNOSIS — N2581 Secondary hyperparathyroidism of renal origin: Secondary | ICD-10-CM | POA: Diagnosis not present

## 2023-08-03 DIAGNOSIS — N186 End stage renal disease: Secondary | ICD-10-CM | POA: Diagnosis not present

## 2023-08-05 DIAGNOSIS — N2581 Secondary hyperparathyroidism of renal origin: Secondary | ICD-10-CM | POA: Diagnosis not present

## 2023-08-05 DIAGNOSIS — Z992 Dependence on renal dialysis: Secondary | ICD-10-CM | POA: Diagnosis not present

## 2023-08-05 DIAGNOSIS — N186 End stage renal disease: Secondary | ICD-10-CM | POA: Diagnosis not present

## 2023-08-08 DIAGNOSIS — Z992 Dependence on renal dialysis: Secondary | ICD-10-CM | POA: Diagnosis not present

## 2023-08-08 DIAGNOSIS — N2581 Secondary hyperparathyroidism of renal origin: Secondary | ICD-10-CM | POA: Diagnosis not present

## 2023-08-08 DIAGNOSIS — N186 End stage renal disease: Secondary | ICD-10-CM | POA: Diagnosis not present

## 2023-08-10 DIAGNOSIS — N186 End stage renal disease: Secondary | ICD-10-CM | POA: Diagnosis not present

## 2023-08-10 DIAGNOSIS — N2581 Secondary hyperparathyroidism of renal origin: Secondary | ICD-10-CM | POA: Diagnosis not present

## 2023-08-10 DIAGNOSIS — Z992 Dependence on renal dialysis: Secondary | ICD-10-CM | POA: Diagnosis not present

## 2023-08-12 DIAGNOSIS — N186 End stage renal disease: Secondary | ICD-10-CM | POA: Diagnosis not present

## 2023-08-12 DIAGNOSIS — Z992 Dependence on renal dialysis: Secondary | ICD-10-CM | POA: Diagnosis not present

## 2023-08-12 DIAGNOSIS — N2581 Secondary hyperparathyroidism of renal origin: Secondary | ICD-10-CM | POA: Diagnosis not present

## 2023-08-15 DIAGNOSIS — N2581 Secondary hyperparathyroidism of renal origin: Secondary | ICD-10-CM | POA: Diagnosis not present

## 2023-08-15 DIAGNOSIS — N186 End stage renal disease: Secondary | ICD-10-CM | POA: Diagnosis not present

## 2023-08-15 DIAGNOSIS — Z992 Dependence on renal dialysis: Secondary | ICD-10-CM | POA: Diagnosis not present

## 2023-08-16 ENCOUNTER — Ambulatory Visit: Payer: 59 | Admitting: Internal Medicine

## 2023-08-16 NOTE — Addendum Note (Signed)
 Encounter addended by: Edward Qualia on: 08/16/2023 4:57 PM  Actions taken: Imaging Exam ended

## 2023-08-17 DIAGNOSIS — N2581 Secondary hyperparathyroidism of renal origin: Secondary | ICD-10-CM | POA: Diagnosis not present

## 2023-08-17 DIAGNOSIS — N186 End stage renal disease: Secondary | ICD-10-CM | POA: Diagnosis not present

## 2023-08-17 DIAGNOSIS — Z992 Dependence on renal dialysis: Secondary | ICD-10-CM | POA: Diagnosis not present

## 2023-08-19 ENCOUNTER — Telehealth: Payer: Self-pay

## 2023-08-19 DIAGNOSIS — N2581 Secondary hyperparathyroidism of renal origin: Secondary | ICD-10-CM | POA: Diagnosis not present

## 2023-08-19 DIAGNOSIS — Z992 Dependence on renal dialysis: Secondary | ICD-10-CM | POA: Diagnosis not present

## 2023-08-19 DIAGNOSIS — N186 End stage renal disease: Secondary | ICD-10-CM | POA: Diagnosis not present

## 2023-08-19 NOTE — Telephone Encounter (Signed)
 Attempted to call for surgery scheduling. LVM

## 2023-08-22 DIAGNOSIS — Z992 Dependence on renal dialysis: Secondary | ICD-10-CM | POA: Diagnosis not present

## 2023-08-22 DIAGNOSIS — J449 Chronic obstructive pulmonary disease, unspecified: Secondary | ICD-10-CM | POA: Diagnosis not present

## 2023-08-22 DIAGNOSIS — N186 End stage renal disease: Secondary | ICD-10-CM | POA: Diagnosis not present

## 2023-08-22 DIAGNOSIS — I10 Essential (primary) hypertension: Secondary | ICD-10-CM | POA: Diagnosis not present

## 2023-08-22 DIAGNOSIS — N2581 Secondary hyperparathyroidism of renal origin: Secondary | ICD-10-CM | POA: Diagnosis not present

## 2023-08-22 DIAGNOSIS — E782 Mixed hyperlipidemia: Secondary | ICD-10-CM | POA: Diagnosis not present

## 2023-08-23 ENCOUNTER — Other Ambulatory Visit: Payer: Self-pay

## 2023-08-23 DIAGNOSIS — N186 End stage renal disease: Secondary | ICD-10-CM

## 2023-08-24 DIAGNOSIS — N186 End stage renal disease: Secondary | ICD-10-CM | POA: Diagnosis not present

## 2023-08-24 DIAGNOSIS — Z992 Dependence on renal dialysis: Secondary | ICD-10-CM | POA: Diagnosis not present

## 2023-08-24 DIAGNOSIS — N2581 Secondary hyperparathyroidism of renal origin: Secondary | ICD-10-CM | POA: Diagnosis not present

## 2023-08-26 DIAGNOSIS — Z992 Dependence on renal dialysis: Secondary | ICD-10-CM | POA: Diagnosis not present

## 2023-08-26 DIAGNOSIS — N186 End stage renal disease: Secondary | ICD-10-CM | POA: Diagnosis not present

## 2023-08-26 DIAGNOSIS — N2581 Secondary hyperparathyroidism of renal origin: Secondary | ICD-10-CM | POA: Diagnosis not present

## 2023-08-27 DIAGNOSIS — N186 End stage renal disease: Secondary | ICD-10-CM | POA: Diagnosis not present

## 2023-08-27 DIAGNOSIS — N2581 Secondary hyperparathyroidism of renal origin: Secondary | ICD-10-CM | POA: Diagnosis not present

## 2023-08-27 DIAGNOSIS — Z992 Dependence on renal dialysis: Secondary | ICD-10-CM | POA: Diagnosis not present

## 2023-08-29 ENCOUNTER — Other Ambulatory Visit: Payer: Self-pay | Admitting: Nurse Practitioner

## 2023-08-29 ENCOUNTER — Other Ambulatory Visit: Payer: Self-pay | Admitting: Student

## 2023-08-29 DIAGNOSIS — N2581 Secondary hyperparathyroidism of renal origin: Secondary | ICD-10-CM | POA: Diagnosis not present

## 2023-08-29 DIAGNOSIS — N186 End stage renal disease: Secondary | ICD-10-CM

## 2023-08-29 DIAGNOSIS — Z992 Dependence on renal dialysis: Secondary | ICD-10-CM | POA: Diagnosis not present

## 2023-08-30 ENCOUNTER — Inpatient Hospital Stay (HOSPITAL_COMMUNITY): Admission: RE | Admit: 2023-08-30 | Source: Ambulatory Visit

## 2023-08-30 ENCOUNTER — Ambulatory Visit (HOSPITAL_COMMUNITY): Admission: RE | Admit: 2023-08-30 | Source: Home / Self Care | Admitting: Surgery

## 2023-08-30 ENCOUNTER — Ambulatory Visit
Admission: RE | Admit: 2023-08-30 | Discharge: 2023-08-30 | Disposition: A | Discharge status: Home / Self Care | Source: Ambulatory Visit | Attending: Nurse Practitioner | Admitting: Nurse Practitioner

## 2023-08-30 ENCOUNTER — Encounter (HOSPITAL_COMMUNITY): Admission: RE | Payer: Self-pay | Source: Home / Self Care

## 2023-08-30 DIAGNOSIS — T8249XA Other complication of vascular dialysis catheter, initial encounter: Secondary | ICD-10-CM | POA: Diagnosis not present

## 2023-08-30 DIAGNOSIS — Z4901 Encounter for fitting and adjustment of extracorporeal dialysis catheter: Secondary | ICD-10-CM | POA: Diagnosis not present

## 2023-08-30 DIAGNOSIS — N186 End stage renal disease: Secondary | ICD-10-CM | POA: Diagnosis not present

## 2023-08-30 HISTORY — PX: IR FLUORO GUIDE CV LINE RIGHT: IMG2283

## 2023-08-30 SURGERY — REMOVAL, DIALYSIS CATHETER
Anesthesia: Choice

## 2023-08-30 MED ORDER — HEPARIN SODIUM (PORCINE) 1000 UNIT/ML IJ SOLN
INTRAMUSCULAR | Status: AC
  Filled 2023-08-30: qty 10

## 2023-08-30 MED ORDER — CEFAZOLIN SODIUM-DEXTROSE 2-4 GM/100ML-% IV SOLN
2.0000 g | INTRAVENOUS | Status: AC
  Administered 2023-08-30: 2 g via INTRAVENOUS

## 2023-08-30 MED ORDER — LIDOCAINE HCL 1 % IJ SOLN
10.0000 mL | Freq: Once | INTRAMUSCULAR | Status: AC
  Administered 2023-08-30: 7 mL via INTRADERMAL

## 2023-08-30 MED ORDER — HEPARIN SODIUM (PORCINE) 1000 UNIT/ML IJ SOLN
3800.0000 [IU] | Freq: Once | INTRAMUSCULAR | Status: AC
Start: 2023-08-30 — End: 2023-08-30
  Administered 2023-08-30: 3800 [IU] via INTRAVENOUS

## 2023-08-30 MED ORDER — LIDOCAINE HCL 1 % IJ SOLN
INTRAMUSCULAR | Status: AC
Start: 2023-08-30 — End: ?
  Filled 2023-08-30: qty 20

## 2023-08-30 MED ORDER — CEFAZOLIN SODIUM-DEXTROSE 2-4 GM/100ML-% IV SOLN
INTRAVENOUS | Status: AC
  Filled 2023-08-30: qty 100

## 2023-08-31 DIAGNOSIS — N2581 Secondary hyperparathyroidism of renal origin: Secondary | ICD-10-CM | POA: Diagnosis not present

## 2023-08-31 DIAGNOSIS — N186 End stage renal disease: Secondary | ICD-10-CM | POA: Diagnosis not present

## 2023-08-31 DIAGNOSIS — Z992 Dependence on renal dialysis: Secondary | ICD-10-CM | POA: Diagnosis not present

## 2023-09-02 DIAGNOSIS — N186 End stage renal disease: Secondary | ICD-10-CM | POA: Diagnosis not present

## 2023-09-02 DIAGNOSIS — N2581 Secondary hyperparathyroidism of renal origin: Secondary | ICD-10-CM | POA: Diagnosis not present

## 2023-09-02 DIAGNOSIS — Z992 Dependence on renal dialysis: Secondary | ICD-10-CM | POA: Diagnosis not present

## 2023-09-05 DIAGNOSIS — N2581 Secondary hyperparathyroidism of renal origin: Secondary | ICD-10-CM | POA: Diagnosis not present

## 2023-09-05 DIAGNOSIS — Z992 Dependence on renal dialysis: Secondary | ICD-10-CM | POA: Diagnosis not present

## 2023-09-05 DIAGNOSIS — N186 End stage renal disease: Secondary | ICD-10-CM | POA: Diagnosis not present

## 2023-09-07 DIAGNOSIS — N186 End stage renal disease: Secondary | ICD-10-CM | POA: Diagnosis not present

## 2023-09-07 DIAGNOSIS — Z992 Dependence on renal dialysis: Secondary | ICD-10-CM | POA: Diagnosis not present

## 2023-09-07 DIAGNOSIS — N2581 Secondary hyperparathyroidism of renal origin: Secondary | ICD-10-CM | POA: Diagnosis not present

## 2023-09-09 DIAGNOSIS — N2581 Secondary hyperparathyroidism of renal origin: Secondary | ICD-10-CM | POA: Diagnosis not present

## 2023-09-09 DIAGNOSIS — Z992 Dependence on renal dialysis: Secondary | ICD-10-CM | POA: Diagnosis not present

## 2023-09-09 DIAGNOSIS — N186 End stage renal disease: Secondary | ICD-10-CM | POA: Diagnosis not present

## 2023-09-12 ENCOUNTER — Telehealth: Payer: Self-pay

## 2023-09-12 ENCOUNTER — Other Ambulatory Visit: Payer: Self-pay

## 2023-09-12 DIAGNOSIS — Z992 Dependence on renal dialysis: Secondary | ICD-10-CM

## 2023-09-12 DIAGNOSIS — N2581 Secondary hyperparathyroidism of renal origin: Secondary | ICD-10-CM | POA: Diagnosis not present

## 2023-09-12 DIAGNOSIS — N186 End stage renal disease: Secondary | ICD-10-CM | POA: Diagnosis not present

## 2023-09-12 NOTE — Telephone Encounter (Signed)
 Rachel from [facility] in [place] called to request urgent fistulogram on patient's Left AVF.  Patient has been having issues with AVF and TDC since February.   Patient has last seen Dr. Vikki Graves but has dialysis on MWF.  [facility] requested a Tu or Th.  This nurse scheduled procedure with [facility] nurse, Candace, in order to expedite treatment.

## 2023-09-14 DIAGNOSIS — N2581 Secondary hyperparathyroidism of renal origin: Secondary | ICD-10-CM | POA: Diagnosis not present

## 2023-09-14 DIAGNOSIS — N186 End stage renal disease: Secondary | ICD-10-CM | POA: Diagnosis not present

## 2023-09-14 DIAGNOSIS — Z992 Dependence on renal dialysis: Secondary | ICD-10-CM | POA: Diagnosis not present

## 2023-09-15 ENCOUNTER — Encounter (HOSPITAL_COMMUNITY): Payer: Self-pay | Admitting: Vascular Surgery

## 2023-09-15 ENCOUNTER — Encounter (HOSPITAL_COMMUNITY)
Admission: RE | Disposition: A | Discharge status: Home / Self Care | Payer: Self-pay | Source: Home / Self Care | Attending: Vascular Surgery

## 2023-09-15 ENCOUNTER — Other Ambulatory Visit: Payer: Self-pay

## 2023-09-15 ENCOUNTER — Ambulatory Visit (HOSPITAL_COMMUNITY)
Admission: RE | Admit: 2023-09-15 | Discharge: 2023-09-15 | Disposition: A | Discharge status: Home / Self Care | Attending: Vascular Surgery | Admitting: Vascular Surgery

## 2023-09-15 DIAGNOSIS — T82590A Other mechanical complication of surgically created arteriovenous fistula, initial encounter: Secondary | ICD-10-CM | POA: Insufficient documentation

## 2023-09-15 DIAGNOSIS — F17210 Nicotine dependence, cigarettes, uncomplicated: Secondary | ICD-10-CM | POA: Diagnosis not present

## 2023-09-15 DIAGNOSIS — I132 Hypertensive heart and chronic kidney disease with heart failure and with stage 5 chronic kidney disease, or end stage renal disease: Secondary | ICD-10-CM | POA: Diagnosis not present

## 2023-09-15 DIAGNOSIS — I5032 Chronic diastolic (congestive) heart failure: Secondary | ICD-10-CM | POA: Insufficient documentation

## 2023-09-15 DIAGNOSIS — N186 End stage renal disease: Secondary | ICD-10-CM | POA: Diagnosis not present

## 2023-09-15 DIAGNOSIS — Y832 Surgical operation with anastomosis, bypass or graft as the cause of abnormal reaction of the patient, or of later complication, without mention of misadventure at the time of the procedure: Secondary | ICD-10-CM | POA: Insufficient documentation

## 2023-09-15 DIAGNOSIS — T82898A Other specified complication of vascular prosthetic devices, implants and grafts, initial encounter: Secondary | ICD-10-CM

## 2023-09-15 DIAGNOSIS — Z992 Dependence on renal dialysis: Secondary | ICD-10-CM | POA: Insufficient documentation

## 2023-09-15 HISTORY — PX: UPPER EXTREMITY INTERVENTION: CATH118271

## 2023-09-15 HISTORY — PX: A/V FISTULAGRAM: CATH118298

## 2023-09-15 LAB — POCT I-STAT, CHEM 8
BUN: 51 mg/dL — ABNORMAL HIGH (ref 6–20)
Calcium, Ion: 1.15 mmol/L (ref 1.15–1.40)
Chloride: 105 mmol/L (ref 98–111)
Creatinine, Ser: 12.4 mg/dL — ABNORMAL HIGH (ref 0.61–1.24)
Glucose, Bld: 95 mg/dL (ref 70–99)
HCT: 50 % (ref 39.0–52.0)
Hemoglobin: 17 g/dL (ref 13.0–17.0)
Potassium: 5 mmol/L (ref 3.5–5.1)
Sodium: 137 mmol/L (ref 135–145)
TCO2: 27 mmol/L (ref 22–32)

## 2023-09-15 SURGERY — A/V FISTULAGRAM
Anesthesia: LOCAL | Laterality: Left

## 2023-09-15 MED ORDER — HEPARIN SODIUM (PORCINE) 1000 UNIT/ML IJ SOLN
INTRAMUSCULAR | Status: DC | PRN
  Administered 2023-09-15: 3000 [IU] via INTRAVENOUS

## 2023-09-15 MED ORDER — HEPARIN (PORCINE) IN NACL 1000-0.9 UT/500ML-% IV SOLN
INTRAVENOUS | Status: DC | PRN
  Administered 2023-09-15: 500 mL

## 2023-09-15 MED ORDER — IODIXANOL 320 MG/ML IV SOLN
INTRAVENOUS | Status: DC | PRN
  Administered 2023-09-15: 40 mL

## 2023-09-15 MED ORDER — HEPARIN SODIUM (PORCINE) 1000 UNIT/ML IJ SOLN
INTRAMUSCULAR | Status: AC
  Filled 2023-09-15: qty 10

## 2023-09-15 MED ORDER — LIDOCAINE HCL (PF) 1 % IJ SOLN
INTRAMUSCULAR | Status: DC | PRN
  Administered 2023-09-15: 3 mL

## 2023-09-15 MED ORDER — CEFAZOLIN SODIUM-DEXTROSE 2-4 GM/100ML-% IV SOLN
2.0000 g | Freq: Once | INTRAVENOUS | Status: DC
Start: 2023-09-15 — End: 2023-09-15

## 2023-09-15 MED ORDER — LIDOCAINE HCL (PF) 1 % IJ SOLN
INTRAMUSCULAR | Status: AC
  Filled 2023-09-15: qty 30

## 2023-09-15 MED ORDER — SODIUM CHLORIDE 0.9% FLUSH: 3.0000 mL | INTRAVENOUS | Status: DC | PRN

## 2023-09-15 SURGICAL SUPPLY — 12 items
BAG SNAP BAND KOVER 36X36 (MISCELLANEOUS) ×2 IMPLANT
BALLOON LUTONIX AV 8X60X75 (BALLOONS) IMPLANT
BALLOON MUSTANG 7X80X75 (BALLOONS) IMPLANT
COVER DOME SNAP 22 D (MISCELLANEOUS) ×2 IMPLANT
KIT ENCORE 26 ADVANTAGE (KITS) IMPLANT
KIT MICROPUNCTURE NIT STIFF (SHEATH) IMPLANT
SHEATH PINNACLE R/O II 6F 4CM (SHEATH) IMPLANT
SHEATH PROBE COVER 6X72 (BAG) ×2 IMPLANT
STOPCOCK MORSE 400PSI 3WAY (MISCELLANEOUS) ×2 IMPLANT
TRAY PV CATH (CUSTOM PROCEDURE TRAY) ×2 IMPLANT
TUBING CIL FLEX 10 FLL-RA (TUBING) ×2 IMPLANT
WIRE BENTSON .035X145CM (WIRE) IMPLANT

## 2023-09-15 NOTE — H&P (Signed)
 H&P     MRN #:  [id number]  History of Present Illness: This is a 53 y.o. male with ESRD that presents for left arm fistulogram.  Patient has a left basilic vein fistula placed by Dr. Vikki Graves.  States he has had trouble and has not used the fistula for 2 weeks.  Currently using a catheter.  Past Medical History:  Diagnosis Date   Anemia    Attributed to chronic disease/chronic kidney disease   Asthma    CHF (congestive heart failure) (HCC)    Chronic diastolic heart failure (HCC) 05/24/2009   EF 35% by echo in 2011; echo in 08/2011-normal EF, moderate to severe LVH; BNP level of [phone] in 2011-12   Chronic obstructive pulmonary disease (HCC)    with asthmatic component   Dysrhythmia    ESRD (end stage renal disease) on dialysis (HCC)    secondary hyperparathyroidism   Hypertension    h/o hypertensive crisis with encephalopathy   Obesity    Tobacco abuse    Approximate consumption of 25 pack years; continuing at 0.5 pack per day    Past Surgical History:  Procedure Laterality Date   ARTERIOVENOUS GRAFT PLACEMENT  2011   AV FISTULA PLACEMENT Left 05/03/2023   Procedure: LEFT ARM BASILIC VEIN FISTULA CREATION;  Surgeon: Adine Hoof, MD;  Location: [facility] OR;  Service: Vascular;  Laterality: Left;   BASCILIC VEIN TRANSPOSITION Left 06/28/2023   Procedure: LEFT ARM BASILIC VEIN TRANSPOSITION;  Surgeon: Adine Hoof, MD;  Location: [facility] OR;  Service: Vascular;  Laterality: Left;   COLONOSCOPY N/A 02/04/2014   Procedure: COLONOSCOPY;  Surgeon: Suzette Espy, MD;  Location: [facility];  Service: Endoscopy;  Laterality: N/A;  12:00   COLONOSCOPY N/A 07/03/2019   Procedure: COLONOSCOPY;  Surgeon: Suzette Espy, MD;  Location: [facility];  Service: Endoscopy;  Laterality: N/A;  12:00-office rescheduled 2/9 @ 11:00am   INSERTION OF DIALYSIS CATHETER Right 03/01/2023   Procedure: INSERTION OF RIGHT INTERNAL JUGULAR TUNNELED DIALYSIS CATHETER;  Surgeon: Adine Hoof, MD;  Location: [facility] OR;  Service: Vascular;  Laterality: Right;   IR FLUORO GUIDE CV LINE RIGHT  04/04/2023   IR FLUORO GUIDE CV LINE RIGHT  07/08/2023   IR FLUORO GUIDE CV LINE RIGHT  08/30/2023   IR US  GUIDE VASC ACCESS RIGHT  07/08/2023   POLYPECTOMY  07/03/2019   Procedure: POLYPECTOMY;  Surgeon: Suzette Espy, MD;  [facility]: [facility];  Service: Endoscopy;;   REVISON OF ARTERIOVENOUS FISTULA Right 01/25/2023   Procedure: REVISION OF RIGHT ARM ARTERIOVENOUS FISTULA;  Surgeon: Adine Hoof, MD;  Location: [facility] OR;  Service: Vascular;  Laterality: Right;   REVISON OF ARTERIOVENOUS FISTULA Right 03/01/2023   Procedure: INSERTION OF ARTERIOVENOUS GRAFT USING ARTEGRAFT  X 42CM;  Surgeon: Adine Hoof, MD;  Location: [facility] OR;  Service: Vascular;  Laterality: Right;   REVISON OF ARTERIOVENOUS FISTULA Right 06/28/2023   Procedure: EXCISION OF RIGHT ARTERIOVENOUS FISTULA PSEUDOANEURYSM;  Surgeon: Adine Hoof, MD;  Location: [facility] OR;  Service: Vascular;  Laterality: Right;   THROMBECTOMY W/ EMBOLECTOMY  01/25/2023   Procedure: THROMBECTOMY RIGHT ARTERIOVENOUS FISTULA WITH BALLOON ANGIOPLASTY;  Surgeon: Adine Hoof, MD;  Location: [facility] OR;  Service: Vascular;;   UPPER EXTREMITY VENOGRAPHY Bilateral 04/07/2023   Procedure: UPPER EXTREMITY VENOGRAPHY;  Surgeon: Young Hensen, MD;  Location: [facility];  Service: Cardiovascular;  Laterality: Bilateral;   VENOGRAM Right 01/25/2023   Procedure: RIGHT ARM  FISTULOGRAM;  Surgeon: Adine Hoof, MD;  Location: [facility] OR;  Service: Vascular;  Laterality: Right;    Not on File  Prior to Admission medications   Medication Sig Start Date End Date Taking? Authorizing Provider  acetaminophen  (TYLENOL ) 500 MG tablet Take 1,000 mg by mouth every 6 (six) hours as needed for mild pain (pain score 1-3), moderate pain (pain score 4-6) or headache.   Yes [provider]  albuterol   (ACCUNEB ) 1.25 MG/3ML nebulizer solution Take 1 ampule by nebulization every 4 (four) hours as needed for wheezing or shortness of breath.   Yes [provider]  aspirin EC 81 MG tablet Take 81 mg by mouth in the morning.   Yes [provider]  atorvastatin (LIPITOR) 10 MG tablet Take 10 mg by mouth every evening.   Yes [provider]  AURYXIA 1 GM 210 MG(Fe) tablet Take 420 mg by mouth 3 (three) times daily with meals. And 420 mg with each snack 11/02/16  Yes [provider]  cinacalcet  (SENSIPAR ) 60 MG tablet Take 60 mg by mouth every Monday, Wednesday, and Friday with hemodialysis.   Yes [provider]  diphenhydramine -acetaminophen  (TYLENOL  PM) 25-500 MG TABS tablet Take 1 tablet by mouth at bedtime as needed (sleep).   Yes [provider]  DULoxetine (CYMBALTA) 60 MG capsule Take 60 mg by mouth in the morning. 11/08/19  Yes [provider]  lidocaine -prilocaine (EMLA) cream Apply 1 Application topically daily as needed (fistula acces). 03/11/23  Yes [provider]  metoprolol  tartrate (LOPRESSOR ) 25 MG tablet Take 3 tablets (75 mg total) by mouth 2 (two) times daily. 06/27/23 09/25/23 Yes Dunn, Dayna N, PA-C  midodrine (PROAMATINE) 5 MG tablet Take 5 mg by mouth every Monday, Wednesday, and Friday. 11/07/22  Yes [provider]  Multiple Vitamins-Minerals (MULTIVITAMIN WITH MINERALS) tablet Take 1 tablet by mouth daily. Men 50+   Yes [provider]  Omega-3 1000 MG CAPS Take 2,000 mg by mouth daily.   Yes [provider]  oxyCODONE -acetaminophen  (PERCOCET) 10-325 MG tablet Take 1 tablet by mouth every 4 (four) hours as needed for pain. 07/04/23  Yes [provider]  sevelamer carbonate (RENVELA) 800 MG tablet Take 1,600-3,200 mg by mouth See admin instructions. Take 3200 mg with meals and 1600 with snack   Yes [provider]  sildenafil (REVATIO) 20 MG tablet Take 40-60 mg by mouth  daily as needed (ED). 01/13/23  Yes [provider]  SYMBICORT  160-4.5 MCG/ACT inhaler Inhale 2 puffs into the lungs 2 (two) times daily as needed (shortness of breath). 09/28/22  Yes [provider]  pregabalin (LYRICA) 75 MG capsule Take 75 mg by mouth 2 (two) times daily. 12/13/22   [provider]    Social History   Socioeconomic History   Marital status: Single    Spouse name: Not on file   Number of children: 3   Years of education: Not on file   Highest education level: Not on file  Occupational History   Occupation: Disability  Tobacco Use   Smoking status: Every Day    Current packs/day: 0.50    Average packs/day: 0.5 packs/day for 38.0 years (19.0 ttl pk-yrs)    Types: Cigarettes    Start date: 09/23/1985   Smokeless tobacco: Never   Tobacco comments:    8-10 ciggs per day   Vaping Use   Vaping status: Never Used  Substance and Sexual Activity   Alcohol  use: Not Currently  Comment: quit etoh about 2011. used to drink about 8-10 beers a week   Drug use: No   Sexual activity: Not on file  Other Topics Concern   Not on file  Social History Narrative   Not on file   Social Drivers of Health   Financial Resource Strain: Not on file  Food Insecurity: Not on file  Transportation Needs: Not on file  Physical Activity: Not on file  Stress: Not on file  Social Connections: Not on file  Intimate Partner Violence: Not on file     Family History  Problem Relation Age of Onset   Heart disease Father    Diabetes Sister    Hypertension Sister    Diabetes Sister    Hypertension Sister    Kidney disease Mother    Hypertension Mother    Hypertension Sister    Hypertension Brother    Hypertension Brother    Hypertension Brother    Colon cancer Neg Hx     ROS: [x]  Positive   [ ]  Negative   [ ]  All sytems reviewed and are negative  Cardiovascular: []  chest pain/pressure []  palpitations []  SOB lying flat []  DOE []  pain in legs while  walking []  pain in legs at rest []  pain in legs at night []  non-healing ulcers []  hx of DVT []  swelling in legs  Pulmonary: []  productive cough []  asthma/wheezing []  home O2  Neurologic: []  weakness in []  arms []  legs []  numbness in []  arms []  legs []  hx of CVA []  mini stroke [] difficulty speaking or slurred speech []  temporary loss of vision in one eye []  dizziness  Hematologic: []  hx of cancer []  bleeding problems []  problems with blood clotting easily  Endocrine:   []  diabetes []  thyroid  disease  GI []  vomiting blood []  blood in stool  GU: []  CKD/renal failure []  HD--[]  M/W/F or []  T/T/S []  burning with urination []  blood in urine  Psychiatric: []  anxiety []  depression  Musculoskeletal: []  arthritis []  joint pain  Integumentary: []  rashes []  ulcers  Constitutional: []  fever []  chills   Physical Examination  Vitals:   09/15/23 0854  BP: 116/85  Pulse: 73  Resp: 18  Temp: 98.3 F (36.8 C)  SpO2: 96%   Body mass index is 36.62 kg/m.  General:  WDWN in NAD Gait: Not observed HENT: WNL, normocephalic Pulmonary: normal non-labored breathing Cardiac: regular, without  Murmurs, rubs or gallops Abdomen:  soft, NT/ND Vascular Exam/Pulses: Left fistula with a thrill but has some pulsatility  CBC    Component Value Date/Time   WBC 7.6 06/20/2023 0900   RBC 5.25 06/20/2023 0900   HGB 17.0 09/15/2023 0847   HGB 10.1 (L) 08/13/2011 1320   HCT 50.0 09/15/2023 0847   HCT 30.4 (L) 08/13/2011 1320   PLT 171 06/20/2023 0900   PLT 237 08/13/2011 1320   MCV 98.3 06/20/2023 0900   MCV 96 08/13/2011 1320   MCH 29.0 06/20/2023 0900   MCHC 29.5 (L) 06/20/2023 0900   RDW 17.5 (H) 06/20/2023 0900   RDW 14.7 (H) 08/13/2011 1320   LYMPHSABS 1.7 05/30/2023 0627   MONOABS 0.9 05/30/2023 0627   EOSABS 0.2 05/30/2023 0627   BASOSABS 0.1 05/30/2023 0627    BMET    Component Value Date/Time   NA 137 09/15/2023 0847   NA 141 08/13/2011 1320   K 5.0  09/15/2023 0847   K 4.4 09/15/2011 1038   CL 105 09/15/2023 0847   CL 104 08/13/2011  1320   CO2 20 (L) 06/28/2023 1337   CO2 27 08/13/2011 1320   GLUCOSE 95 09/15/2023 0847   GLUCOSE 90 08/13/2011 1320   BUN 51 (H) 09/15/2023 0847   BUN 24 (H) 08/13/2011 1320   CREATININE 12.40 (H) 09/15/2023 0847   CREATININE 5.75 (H) 01/04/2013 1140   CALCIUM 9.0 06/28/2023 1337   CALCIUM 9.6 08/13/2011 1320   CALCIUM 8.8 08/02/2009 1724   GFRNONAA 4 (L) 06/28/2023 1337   GFRNONAA 8 (L) 08/13/2011 1320   GFRAA 10 (L) 08/13/2011 1320    COAGS: Lab Results  Component Value Date   INR 1.06 07/25/2010     Non-Invasive Vascular Imaging:    N/a   ASSESSMENT/PLAN: This is a 53 y.o. male with ESRD that presents for left arm fistulogram.  Patient has a left basilic vein fistula placed by Dr. Vikki Graves.  This has been poorly functioning and he has not use this in 2 weeks.  Discussed indication for fistulogram with possible intervention and risk benefits.  All questions answered.  Young Hensen, MD [facility] Office: [phone]  Young Hensen

## 2023-09-15 NOTE — Op Note (Signed)
    OPERATIVE NOTE   DATE: September 15, 2023  PROCEDURE: left brachiobasilic arteriovenous fistula cannulation under ultrasound guidance left arm fistulogram including central venogram left peripheral angioplasty basilic vein (7 mm Mustang and 8 mm drug coated Lutonix)  PRE-OPERATIVE DIAGNOSIS: Malfunctioning left arm arteriovenous fistula  POST-OPERATIVE DIAGNOSIS: same as above   SURGEON: Young Hensen, MD  ANESTHESIA: local  ESTIMATED BLOOD LOSS: 5 cc  FINDING(S): No evidence of central venous stenosis.  The left upper arm basilic vein had a 70% stenosis that was treated with a 7 mm Mustang and 8 mm drug coated Lutonix.  No residual stenosis.  Much better thrill.  SPECIMEN(S):  None  CONTRAST: 40 mL  INDICATIONS: Brian Barrera is a 53 y.o. male who  presents with malfunctioning left arm arteriovenous fistula.  The patient is scheduled for left arm fistulogram.  The patient is aware the risks include but are not limited to: bleeding, infection, thrombosis of the cannulated access, and possible anaphylactic reaction to the contrast.  The patient is aware of the risks of the procedure and elects to proceed forward.  DESCRIPTION: After full informed written consent was obtained, the patient was brought back to the angiography suite and placed supine upon the angiography table.  The patient was connected to monitoring equipment.  The left arm was prepped and draped in the standard fashion for a left arm fistulogram.  Under ultrasound guidance, the left arm was evaluated, it was patent, an image was saved.  It was cannulated with a micropuncture needle.  The microwire was advanced into the fistula and the needle was exchanged for the a microsheath, which was lodged 2 cm into the access.  The wire was removed and the sheath was connected to the IV extension tubing.  Hand injections were completed to image the access from the antecubitum up to the level of axilla.  The central  venous structures were also imaged by hand injections.  Based on the images elected for intervention.  I used a Bentson wire and exchanged for a short 7 [place] sheath.  Patient was given 3000 units of IV heparin .  The lesion was crossed and treated with a 7 mm x 80 mm Mustang for 2 minutes.  I took an image and there was still some residual stenosis.  I upsized to an 8 mm x 60 mm drug-coated Lutonix for 3 minutes.  Much better results..  A 4-0 Monocryl purse-string suture was sewn around the sheath.  The sheath was removed while tying down the suture.  A sterile bandage was applied to the puncture site.  COMPLICATIONS: None  CONDITION: Stable  Young Hensen, MD [facility] Office: [phone]  Young Hensen   09/15/2023 12:53 PM

## 2023-09-16 DIAGNOSIS — N2581 Secondary hyperparathyroidism of renal origin: Secondary | ICD-10-CM | POA: Diagnosis not present

## 2023-09-16 DIAGNOSIS — Z992 Dependence on renal dialysis: Secondary | ICD-10-CM | POA: Diagnosis not present

## 2023-09-16 DIAGNOSIS — N186 End stage renal disease: Secondary | ICD-10-CM | POA: Diagnosis not present

## 2023-09-19 DIAGNOSIS — N186 End stage renal disease: Secondary | ICD-10-CM | POA: Diagnosis not present

## 2023-09-19 DIAGNOSIS — Z992 Dependence on renal dialysis: Secondary | ICD-10-CM | POA: Diagnosis not present

## 2023-09-19 DIAGNOSIS — N2581 Secondary hyperparathyroidism of renal origin: Secondary | ICD-10-CM | POA: Diagnosis not present

## 2023-09-21 DIAGNOSIS — Z992 Dependence on renal dialysis: Secondary | ICD-10-CM | POA: Diagnosis not present

## 2023-09-21 DIAGNOSIS — N2581 Secondary hyperparathyroidism of renal origin: Secondary | ICD-10-CM | POA: Diagnosis not present

## 2023-09-21 DIAGNOSIS — E782 Mixed hyperlipidemia: Secondary | ICD-10-CM | POA: Diagnosis not present

## 2023-09-21 DIAGNOSIS — J449 Chronic obstructive pulmonary disease, unspecified: Secondary | ICD-10-CM | POA: Diagnosis not present

## 2023-09-21 DIAGNOSIS — N186 End stage renal disease: Secondary | ICD-10-CM | POA: Diagnosis not present

## 2023-09-21 DIAGNOSIS — I10 Essential (primary) hypertension: Secondary | ICD-10-CM | POA: Diagnosis not present

## 2023-09-22 DIAGNOSIS — N2581 Secondary hyperparathyroidism of renal origin: Secondary | ICD-10-CM | POA: Diagnosis not present

## 2023-09-22 DIAGNOSIS — N186 End stage renal disease: Secondary | ICD-10-CM | POA: Diagnosis not present

## 2023-09-22 DIAGNOSIS — Z992 Dependence on renal dialysis: Secondary | ICD-10-CM | POA: Diagnosis not present

## 2023-09-23 DIAGNOSIS — N186 End stage renal disease: Secondary | ICD-10-CM | POA: Diagnosis not present

## 2023-09-23 DIAGNOSIS — Z992 Dependence on renal dialysis: Secondary | ICD-10-CM | POA: Diagnosis not present

## 2023-09-23 DIAGNOSIS — N2581 Secondary hyperparathyroidism of renal origin: Secondary | ICD-10-CM | POA: Diagnosis not present

## 2023-09-26 DIAGNOSIS — N2581 Secondary hyperparathyroidism of renal origin: Secondary | ICD-10-CM | POA: Diagnosis not present

## 2023-09-26 DIAGNOSIS — N186 End stage renal disease: Secondary | ICD-10-CM | POA: Diagnosis not present

## 2023-09-26 DIAGNOSIS — Z992 Dependence on renal dialysis: Secondary | ICD-10-CM | POA: Diagnosis not present

## 2023-09-27 DIAGNOSIS — Z79891 Long term (current) use of opiate analgesic: Secondary | ICD-10-CM | POA: Diagnosis not present

## 2023-09-27 DIAGNOSIS — M549 Dorsalgia, unspecified: Secondary | ICD-10-CM | POA: Diagnosis not present

## 2023-09-27 DIAGNOSIS — M25569 Pain in unspecified knee: Secondary | ICD-10-CM | POA: Diagnosis not present

## 2023-09-27 DIAGNOSIS — M25511 Pain in right shoulder: Secondary | ICD-10-CM | POA: Diagnosis not present

## 2023-09-27 DIAGNOSIS — G894 Chronic pain syndrome: Secondary | ICD-10-CM | POA: Diagnosis not present

## 2023-09-27 DIAGNOSIS — M542 Cervicalgia: Secondary | ICD-10-CM | POA: Diagnosis not present

## 2023-09-27 DIAGNOSIS — M25512 Pain in left shoulder: Secondary | ICD-10-CM | POA: Diagnosis not present

## 2023-09-28 DIAGNOSIS — N2581 Secondary hyperparathyroidism of renal origin: Secondary | ICD-10-CM | POA: Diagnosis not present

## 2023-09-28 DIAGNOSIS — Z992 Dependence on renal dialysis: Secondary | ICD-10-CM | POA: Diagnosis not present

## 2023-09-28 DIAGNOSIS — N186 End stage renal disease: Secondary | ICD-10-CM | POA: Diagnosis not present

## 2023-09-30 DIAGNOSIS — N186 End stage renal disease: Secondary | ICD-10-CM | POA: Diagnosis not present

## 2023-09-30 DIAGNOSIS — Z992 Dependence on renal dialysis: Secondary | ICD-10-CM | POA: Diagnosis not present

## 2023-09-30 DIAGNOSIS — N2581 Secondary hyperparathyroidism of renal origin: Secondary | ICD-10-CM | POA: Diagnosis not present

## 2023-10-03 DIAGNOSIS — Z992 Dependence on renal dialysis: Secondary | ICD-10-CM | POA: Diagnosis not present

## 2023-10-03 DIAGNOSIS — N186 End stage renal disease: Secondary | ICD-10-CM | POA: Diagnosis not present

## 2023-10-03 DIAGNOSIS — N2581 Secondary hyperparathyroidism of renal origin: Secondary | ICD-10-CM | POA: Diagnosis not present

## 2023-10-04 ENCOUNTER — Ambulatory Visit: Admitting: Internal Medicine

## 2023-10-04 DIAGNOSIS — I120 Hypertensive chronic kidney disease with stage 5 chronic kidney disease or end stage renal disease: Secondary | ICD-10-CM | POA: Diagnosis not present

## 2023-10-04 DIAGNOSIS — R5382 Chronic fatigue, unspecified: Secondary | ICD-10-CM | POA: Diagnosis not present

## 2023-10-04 DIAGNOSIS — I10 Essential (primary) hypertension: Secondary | ICD-10-CM | POA: Diagnosis not present

## 2023-10-04 DIAGNOSIS — Z1329 Encounter for screening for other suspected endocrine disorder: Secondary | ICD-10-CM | POA: Diagnosis not present

## 2023-10-04 DIAGNOSIS — J449 Chronic obstructive pulmonary disease, unspecified: Secondary | ICD-10-CM | POA: Diagnosis not present

## 2023-10-05 DIAGNOSIS — N186 End stage renal disease: Secondary | ICD-10-CM | POA: Diagnosis not present

## 2023-10-05 DIAGNOSIS — N2581 Secondary hyperparathyroidism of renal origin: Secondary | ICD-10-CM | POA: Diagnosis not present

## 2023-10-05 DIAGNOSIS — Z992 Dependence on renal dialysis: Secondary | ICD-10-CM | POA: Diagnosis not present

## 2023-10-06 ENCOUNTER — Telehealth (HOSPITAL_COMMUNITY): Payer: Self-pay | Admitting: *Deleted

## 2023-10-06 NOTE — Telephone Encounter (Signed)
 Received fax from [facility] requesting CVC removal; AVF functioning without difficulty.  Will give to [facility] and scan fax into media.

## 2023-10-07 DIAGNOSIS — N186 End stage renal disease: Secondary | ICD-10-CM | POA: Diagnosis not present

## 2023-10-07 DIAGNOSIS — Z992 Dependence on renal dialysis: Secondary | ICD-10-CM | POA: Diagnosis not present

## 2023-10-07 DIAGNOSIS — N2581 Secondary hyperparathyroidism of renal origin: Secondary | ICD-10-CM | POA: Diagnosis not present

## 2023-10-10 ENCOUNTER — Other Ambulatory Visit (HOSPITAL_COMMUNITY): Payer: Self-pay | Admitting: Nurse Practitioner

## 2023-10-10 ENCOUNTER — Telehealth (HOSPITAL_COMMUNITY): Payer: Self-pay

## 2023-10-10 DIAGNOSIS — N2581 Secondary hyperparathyroidism of renal origin: Secondary | ICD-10-CM | POA: Diagnosis not present

## 2023-10-10 DIAGNOSIS — Z992 Dependence on renal dialysis: Secondary | ICD-10-CM | POA: Diagnosis not present

## 2023-10-10 DIAGNOSIS — Z131 Encounter for screening for diabetes mellitus: Secondary | ICD-10-CM | POA: Diagnosis not present

## 2023-10-10 DIAGNOSIS — N186 End stage renal disease: Secondary | ICD-10-CM

## 2023-10-10 NOTE — Telephone Encounter (Signed)
 Pt returned call. He was very confused about removing his CVC bc he said that he still uses it. I will reach out to ordering to clarify. AB

## 2023-10-10 NOTE — Telephone Encounter (Signed)
 Called to schedule tdc removal, no answer, left vm. AB

## 2023-10-11 ENCOUNTER — Telehealth: Payer: Self-pay

## 2023-10-11 DIAGNOSIS — Z992 Dependence on renal dialysis: Secondary | ICD-10-CM | POA: Diagnosis not present

## 2023-10-11 DIAGNOSIS — J208 Acute bronchitis due to other specified organisms: Secondary | ICD-10-CM | POA: Diagnosis not present

## 2023-10-11 DIAGNOSIS — J449 Chronic obstructive pulmonary disease, unspecified: Secondary | ICD-10-CM | POA: Diagnosis not present

## 2023-10-11 DIAGNOSIS — I120 Hypertensive chronic kidney disease with stage 5 chronic kidney disease or end stage renal disease: Secondary | ICD-10-CM | POA: Diagnosis not present

## 2023-10-11 NOTE — Telephone Encounter (Signed)
 Rec'd request from [facility] in [facility] to have patient's TDC removed.  IR rep, Odilia Bennett, attempted to call patient for scheduling removal.  Patient was confused, stating that the [facility] is still being used at his dialysis center.  Message faxed to [facility] for clarification.

## 2023-10-12 DIAGNOSIS — N2581 Secondary hyperparathyroidism of renal origin: Secondary | ICD-10-CM | POA: Diagnosis not present

## 2023-10-12 DIAGNOSIS — N186 End stage renal disease: Secondary | ICD-10-CM | POA: Diagnosis not present

## 2023-10-12 DIAGNOSIS — Z992 Dependence on renal dialysis: Secondary | ICD-10-CM | POA: Diagnosis not present

## 2023-10-13 ENCOUNTER — Encounter (HOSPITAL_COMMUNITY)
Admission: RE | Disposition: A | Discharge status: Home / Self Care | Payer: Self-pay | Source: Home / Self Care | Attending: Vascular Surgery

## 2023-10-13 ENCOUNTER — Encounter (HOSPITAL_COMMUNITY): Payer: Self-pay | Admitting: Vascular Surgery

## 2023-10-13 ENCOUNTER — Ambulatory Visit (HOSPITAL_COMMUNITY)
Admission: RE | Admit: 2023-10-13 | Discharge: 2023-10-13 | Disposition: A | Discharge status: Home / Self Care | Attending: Vascular Surgery | Admitting: Vascular Surgery

## 2023-10-13 ENCOUNTER — Other Ambulatory Visit: Payer: Self-pay

## 2023-10-13 DIAGNOSIS — Z87891 Personal history of nicotine dependence: Secondary | ICD-10-CM | POA: Diagnosis not present

## 2023-10-13 DIAGNOSIS — T82898A Other specified complication of vascular prosthetic devices, implants and grafts, initial encounter: Secondary | ICD-10-CM | POA: Diagnosis not present

## 2023-10-13 DIAGNOSIS — T8241XA Breakdown (mechanical) of vascular dialysis catheter, initial encounter: Secondary | ICD-10-CM | POA: Insufficient documentation

## 2023-10-13 DIAGNOSIS — Y718 Miscellaneous cardiovascular devices associated with adverse incidents, not elsewhere classified: Secondary | ICD-10-CM | POA: Diagnosis not present

## 2023-10-13 DIAGNOSIS — I132 Hypertensive heart and chronic kidney disease with heart failure and with stage 5 chronic kidney disease, or end stage renal disease: Secondary | ICD-10-CM | POA: Diagnosis not present

## 2023-10-13 DIAGNOSIS — I5032 Chronic diastolic (congestive) heart failure: Secondary | ICD-10-CM | POA: Diagnosis not present

## 2023-10-13 DIAGNOSIS — N186 End stage renal disease: Secondary | ICD-10-CM | POA: Insufficient documentation

## 2023-10-13 DIAGNOSIS — Z992 Dependence on renal dialysis: Secondary | ICD-10-CM | POA: Insufficient documentation

## 2023-10-13 DIAGNOSIS — Z8249 Family history of ischemic heart disease and other diseases of the circulatory system: Secondary | ICD-10-CM | POA: Insufficient documentation

## 2023-10-13 DIAGNOSIS — F17210 Nicotine dependence, cigarettes, uncomplicated: Secondary | ICD-10-CM | POA: Diagnosis not present

## 2023-10-13 DIAGNOSIS — Z122 Encounter for screening for malignant neoplasm of respiratory organs: Secondary | ICD-10-CM | POA: Diagnosis not present

## 2023-10-13 DIAGNOSIS — I2510 Atherosclerotic heart disease of native coronary artery without angina pectoris: Secondary | ICD-10-CM | POA: Diagnosis not present

## 2023-10-13 HISTORY — PX: A/V SHUNT INTERVENTION: CATH118220

## 2023-10-13 HISTORY — PX: VENOUS ANGIOPLASTY: CATH118376

## 2023-10-13 SURGERY — A/V SHUNT INTERVENTION
Anesthesia: LOCAL

## 2023-10-13 MED ORDER — HEPARIN SODIUM (PORCINE) 1000 UNIT/ML IJ SOLN
INTRAMUSCULAR | Status: AC
  Filled 2023-10-13: qty 10

## 2023-10-13 MED ORDER — LIDOCAINE HCL (PF) 1 % IJ SOLN
INTRAMUSCULAR | Status: DC | PRN
  Administered 2023-10-13: 5 mL

## 2023-10-13 MED ORDER — HEPARIN (PORCINE) IN NACL 1000-0.9 UT/500ML-% IV SOLN
INTRAVENOUS | Status: DC | PRN
  Administered 2023-10-13: 500 mL

## 2023-10-13 MED ORDER — IODIXANOL 320 MG/ML IV SOLN
INTRAVENOUS | Status: DC | PRN
  Administered 2023-10-13: 30 mL via INTRAVENOUS

## 2023-10-13 MED ORDER — LIDOCAINE HCL (PF) 1 % IJ SOLN
INTRAMUSCULAR | Status: AC
  Filled 2023-10-13: qty 30

## 2023-10-13 SURGICAL SUPPLY — 7 items
BALLOON MUSTANG 7.0X40 75 (BALLOONS) IMPLANT
KIT ENCORE 40 (KITS) IMPLANT
KIT MICROPUNCTURE NIT STIFF (SHEATH) IMPLANT
SHEATH PINNACLE R/O II 6F 4CM (SHEATH) IMPLANT
SHEATH PROBE COVER 6X72 (BAG) IMPLANT
TRAY PV CATH (CUSTOM PROCEDURE TRAY) ×2 IMPLANT
WIRE BENTSON .035X145CM (WIRE) IMPLANT

## 2023-10-13 NOTE — H&P (Signed)
 Hospital H&P    Reason for Consult: Left arm fistula function Requesting Physician: Nephrology MRN #:  [id number]  History of Present Illness: This is a 53 y.o. male known to the vascular surgery service having previously undergone left arm brachiobasilic fistula creation.  He had a fistulogram last month which demonstrated some stenosis of the basilic vein.  This was balloon plasty.  He had the fistula accessed, however there was an infiltration event, leading to further fistula rest and continued TDC use.  On exam, Brian Barrera was doing well.  Past Medical History:  Diagnosis Date   Anemia    Attributed to chronic disease/chronic kidney disease   Asthma    CHF (congestive heart failure) (HCC)    Chronic diastolic heart failure (HCC) 05/24/2009   EF 35% by echo in 2011; echo in 08/2011-normal EF, moderate to severe LVH; BNP level of [phone] in 2011-12   Chronic obstructive pulmonary disease (HCC)    with asthmatic component   Dysrhythmia    ESRD (end stage renal disease) on dialysis (HCC)    secondary hyperparathyroidism   Hypertension    h/o hypertensive crisis with encephalopathy   Obesity    Tobacco abuse    Approximate consumption of 25 pack years; continuing at 0.5 pack per day    Past Surgical History:  Procedure Laterality Date   A/V FISTULAGRAM Left 09/15/2023   Procedure: A/V Fistulagram;  Surgeon: Young Hensen, MD;  Location: [facility] INVASIVE CV LAB;  Service: Cardiovascular;  Laterality: Left;   ARTERIOVENOUS GRAFT PLACEMENT  05/24/2009   AV FISTULA PLACEMENT Left 05/03/2023   Procedure: LEFT ARM BASILIC VEIN FISTULA CREATION;  Surgeon: Adine Hoof, MD;  Location: [facility] OR;  Service: Vascular;  Laterality: Left;   BASCILIC VEIN TRANSPOSITION Left 06/28/2023   Procedure: LEFT ARM BASILIC VEIN TRANSPOSITION;  Surgeon: Adine Hoof, MD;  Location: [facility] OR;  Service: Vascular;  Laterality: Left;   COLONOSCOPY N/A 02/04/2014   Procedure:  COLONOSCOPY;  Surgeon: Suzette Espy, MD;  [facility];  Service: Endoscopy;  Laterality: N/A;  12:00   COLONOSCOPY N/A 07/03/2019   Procedure: COLONOSCOPY;  Surgeon: Suzette Espy, MD;  Location: [facility];  Service: Endoscopy;  Laterality: N/A;  12:00-office rescheduled 2/9 @ 11:00am   INSERTION OF DIALYSIS CATHETER Right 03/01/2023   Procedure: INSERTION OF RIGHT INTERNAL JUGULAR TUNNELED DIALYSIS CATHETER;  Surgeon: Adine Hoof, MD;  Location: [facility] OR;  Service: Vascular;  Laterality: Right;   IR FLUORO GUIDE CV LINE RIGHT  04/04/2023   IR FLUORO GUIDE CV LINE RIGHT  07/08/2023   IR FLUORO GUIDE CV LINE RIGHT  08/30/2023   IR US  GUIDE VASC ACCESS RIGHT  07/08/2023   POLYPECTOMY  07/03/2019   Procedure: POLYPECTOMY;  Surgeon: Suzette Espy, MD;  [facility];  Service: Endoscopy;;   REVISON OF ARTERIOVENOUS FISTULA Right 01/25/2023   Procedure: REVISION OF RIGHT ARM ARTERIOVENOUS FISTULA;  Surgeon: Adine Hoof, MD;  Location: [facility] OR;  Service: Vascular;  Laterality: Right;   REVISON OF ARTERIOVENOUS FISTULA Right 03/01/2023   Procedure: INSERTION OF ARTERIOVENOUS GRAFT USING ARTEGRAFT  X 42CM;  Surgeon: Adine Hoof, MD;  Location: [facility] OR;  Service: Vascular;  Laterality: Right;   REVISON OF ARTERIOVENOUS FISTULA Right 06/28/2023   Procedure: EXCISION OF RIGHT ARTERIOVENOUS FISTULA PSEUDOANEURYSM;  Surgeon: Adine Hoof, MD;  Location: [facility] OR;  Service: Vascular;  Laterality: Right;   THROMBECTOMY W/ EMBOLECTOMY  01/25/2023   Procedure:  THROMBECTOMY RIGHT ARTERIOVENOUS FISTULA WITH BALLOON ANGIOPLASTY;  Surgeon: Adine Hoof, MD;  Location: [facility] OR;  Service: Vascular;;   UPPER EXTREMITY INTERVENTION  09/15/2023   Procedure: UPPER EXTREMITY INTERVENTION;  Surgeon: Young Hensen, MD;  Location: [facility] INVASIVE CV LAB;  Service: Cardiovascular;;   UPPER EXTREMITY VENOGRAPHY Bilateral 04/07/2023    Procedure: UPPER EXTREMITY VENOGRAPHY;  Surgeon: Young Hensen, MD;  Location: [facility] INVASIVE CV LAB;  Service: Cardiovascular;  Laterality: Bilateral;   VENOGRAM Right 01/25/2023   Procedure: RIGHT ARM FISTULOGRAM;  Surgeon: Adine Hoof, MD;  Location: [facility] OR;  Service: Vascular;  Laterality: Right;    Not on File  Prior to Admission medications   Medication Sig Start Date End Date Taking? Authorizing Provider  acetaminophen  (TYLENOL ) 500 MG tablet Take 1,000 mg by mouth every 6 (six) hours as needed for mild pain (pain score 1-3), moderate pain (pain score 4-6) or headache.    [provider]  albuterol  (ACCUNEB ) 1.25 MG/3ML nebulizer solution Take 1 ampule by nebulization every 4 (four) hours as needed for wheezing or shortness of breath.    [provider]  aspirin EC 81 MG tablet Take 81 mg by mouth in the morning.    [provider]  atorvastatin (LIPITOR) 10 MG tablet Take 10 mg by mouth every evening.    [provider]  AURYXIA 1 GM 210 MG(Fe) tablet Take 420 mg by mouth 3 (three) times daily with meals. And 420 mg with each snack 11/02/16   [provider]  cinacalcet  (SENSIPAR ) 60 MG tablet Take 60 mg by mouth every Monday, Wednesday, and Friday with hemodialysis.    [provider]  diphenhydramine -acetaminophen  (TYLENOL  PM) 25-500 MG TABS tablet Take 1 tablet by mouth at bedtime as needed (sleep).    [provider]  DULoxetine (CYMBALTA) 60 MG capsule Take 60 mg by mouth in the morning. 11/08/19   [provider]  lidocaine -prilocaine (EMLA) cream Apply 1 Application topically daily as needed (fistula acces). 03/11/23   [provider]  metoprolol  tartrate (LOPRESSOR ) 25 MG tablet Take 3 tablets (75 mg total) by mouth 2 (two) times daily. 06/27/23 09/25/23  Dunn, Dayna N, PA-C  midodrine (PROAMATINE) 5 MG tablet Take 5 mg by mouth every Monday, Wednesday, and Friday. 11/07/22   [provider]  Multiple Vitamins-Minerals (MULTIVITAMIN WITH MINERALS) tablet Take 1 tablet by mouth daily. Men 50+    [provider]  Omega-3 1000 MG CAPS Take 2,000 mg by mouth daily.    [provider]  oxyCODONE -acetaminophen  (PERCOCET) 10-325 MG tablet Take 1 tablet by mouth every 4 (four) hours as needed for pain. 07/04/23   [provider]  pregabalin (LYRICA) 75 MG capsule Take 75 mg by mouth 2 (two) times daily. 12/13/22   [provider]  sevelamer carbonate (RENVELA) 800 MG tablet Take 1,600-3,200 mg by mouth See admin instructions. Take 3200 mg with meals and 1600 with snack    [provider]  sildenafil (REVATIO) 20 MG tablet Take 40-60 mg by mouth daily as needed (ED). 01/13/23   [provider]  SYMBICORT  160-4.5 MCG/ACT inhaler Inhale 2 puffs into the lungs 2 (two) times daily as needed (shortness of breath). 09/28/22   [provider]    Social History   Socioeconomic History   Marital status: Single    Spouse name: Not on file   Number of children: 3   Years of education: Not on file   Highest  education level: Not on file  Occupational History   Occupation: Disability  Tobacco Use   Smoking status: Every Day    Current packs/day: 0.50    Average packs/day: 0.5 packs/day for 38.1 years (19.0 ttl pk-yrs)    Types: Cigarettes    Start date: 09/23/1985   Smokeless tobacco: Never   Tobacco comments:    8-10 ciggs per day   Vaping Use   Vaping status: Never Used  Substance and Sexual Activity   Alcohol  use: Not Currently    Comment: quit etoh about 2011. used to drink about 8-10 beers a week   Drug use: No   Sexual activity: Not on file  Other Topics Concern   Not on file  Social History Narrative   Not on file   Social Drivers of Health   Financial Resource Strain: Not on file  Food Insecurity: Not on file  Transportation Needs: Not on file  Physical Activity: Not on file  Stress: Not on file   Social Connections: Not on file  Intimate Partner Violence: Not on file    Family History  Problem Relation Age of Onset   Heart disease Father    Diabetes Sister    Hypertension Sister    Diabetes Sister    Hypertension Sister    Kidney disease Mother    Hypertension Mother    Hypertension Sister    Hypertension Brother    Hypertension Brother    Hypertension Brother    Colon cancer Neg Hx     ROS: Otherwise negative unless mentioned in HPI  Physical Examination  There were no vitals filed for this visit. There is no height or weight on file to calculate BMI.  General:  WDWN in NAD Gait: Not observed HENT: WNL, normocephalic Pulmonary: normal non-labored breathing, without Rales, rhonchi,  wheezing Cardiac: regular, Abdomen:  soft, NT/ND, no masses Skin: without rashes Vascular Exam/Pulses: Excellent thrill in the left arm fistula Extremities: without ischemic changes, without Gangrene , without cellulitis; without open wounds;  Musculoskeletal: no muscle wasting or atrophy  Neurologic: A&O X 3;  No focal weakness or paresthesias are detected; speech is fluent/normal Psychiatric:  The pt has Normal affect. Lymph:  Unremarkable  CBC    Component Value Date/Time   WBC 7.6 06/20/2023 0900   RBC 5.25 06/20/2023 0900   HGB 17.0 09/15/2023 0847   HGB 10.1 (L) 08/13/2011 1320   HCT 50.0 09/15/2023 0847   HCT 30.4 (L) 08/13/2011 1320   PLT 171 06/20/2023 0900   PLT 237 08/13/2011 1320   MCV 98.3 06/20/2023 0900   MCV 96 08/13/2011 1320   MCH 29.0 06/20/2023 0900   MCHC 29.5 (L) 06/20/2023 0900   RDW 17.5 (H) 06/20/2023 0900   RDW 14.7 (H) 08/13/2011 1320   LYMPHSABS 1.7 05/30/2023 0627   MONOABS 0.9 05/30/2023 0627   EOSABS 0.2 05/30/2023 0627   BASOSABS 0.1 05/30/2023 0627    BMET    Component Value Date/Time   NA 137 09/15/2023 0847   NA 141 08/13/2011 1320   K 5.0 09/15/2023 0847   K 4.4 09/15/2011 1038   CL 105 09/15/2023 0847   CL 104 08/13/2011  1320   CO2 20 (L) 06/28/2023 1337   CO2 27 08/13/2011 1320   GLUCOSE 95 09/15/2023 0847   GLUCOSE 90 08/13/2011 1320   BUN 51 (H) 09/15/2023 0847   BUN 24 (H) 08/13/2011 1320   CREATININE 12.40 (H) 09/15/2023 0847   CREATININE 5.75 (H) 01/04/2013 1140  CALCIUM 9.0 06/28/2023 1337   CALCIUM 9.6 08/13/2011 1320   CALCIUM 8.8 08/02/2009 1724   GFRNONAA 4 (L) 06/28/2023 1337   GFRNONAA 8 (L) 08/13/2011 1320   GFRAA 10 (L) 08/13/2011 1320    COAGS: Lab Results  Component Value Date   INR 1.06 07/25/2010      ASSESSMENT/PLAN: This is a 53 y.o. male with left arm brachiobasilic fistula.  Previous fistulogram demonstrated some stenosis within the vein, this was balloon venoplasty with a 7 mm balloon.  He presents today after infiltration episode.  On physical exam, he had a excellent thrill in the fistula.  Will perform fistulogram to ensure flow is adequate prior to reaccess.  After discussing the risk and benefits, Axiel elected to proceed.    Kayla Part MD MS [facility] [phone] 10/13/2023  8:21 AM

## 2023-10-13 NOTE — Op Note (Signed)
    Patient name: Brian Barrera MRN: [id number] DOB: Oct 09, 1970 Sex: male  10/13/2023 Pre-operative Diagnosis: Left arm fistula malfunction Post-operative diagnosis:  Same Surgeon:  Kayla Part, MD Procedure Performed: 1.  Ultrasound-guided micropuncture access of the left brachiobasilic fistula 2. Fistulogram 3.  Brachiobasilic fistula balloon venoplasty 7 x 40 mm Contrast volume 30 mL   Indications: Patient is a 53 year old male with history of left arm brachiobasilic fistula.  The fistula is working well, but recently he had an infiltration episode.  He is currently being dialyzed through a TDC.  He presents for fistulogram today to ensure flows adequate prior to reaccessed.  Findings:   Widely patent arterial anastomosis Focal area of 70% stenosis at the mid fistula. Widely patent outflow   Procedure:  The patient was identified in the holding area and taken to room 8.  The patient was then placed supine on the table and prepped and draped in the usual sterile fashion.  A time out was called.  The patient was accessed using ultrasound-guided micropuncture needle in antegrade fashion.  Fistulogram followed.  See results above.  I elected to intervene on the focal area of 7% stenosis.  A 7 x 40 mm balloon was brought to the field and the balloon was inflated for 3 minutes.  Follow-up angiography demonstrated significant improvement with 30% residual stenosis.  I was happy with this result.  The fistula had a palpable thrill.  Should this continue to be an issue, my plan to be to place a stent across this lesion in an effort to help with fistula maturity.  With the current size and thrill.  There is no reason that this should have access issues.  Fistula amenable to further intervention.   Kayla Part MD [facility] [place] Office: [phone]

## 2023-10-14 ENCOUNTER — Encounter (HOSPITAL_COMMUNITY): Payer: Self-pay | Admitting: Vascular Surgery

## 2023-10-14 DIAGNOSIS — N2581 Secondary hyperparathyroidism of renal origin: Secondary | ICD-10-CM | POA: Diagnosis not present

## 2023-10-14 DIAGNOSIS — N186 End stage renal disease: Secondary | ICD-10-CM | POA: Diagnosis not present

## 2023-10-14 DIAGNOSIS — Z992 Dependence on renal dialysis: Secondary | ICD-10-CM | POA: Diagnosis not present

## 2023-10-17 DIAGNOSIS — N2581 Secondary hyperparathyroidism of renal origin: Secondary | ICD-10-CM | POA: Diagnosis not present

## 2023-10-17 DIAGNOSIS — Z992 Dependence on renal dialysis: Secondary | ICD-10-CM | POA: Diagnosis not present

## 2023-10-17 DIAGNOSIS — N186 End stage renal disease: Secondary | ICD-10-CM | POA: Diagnosis not present

## 2023-10-19 DIAGNOSIS — M1712 Unilateral primary osteoarthritis, left knee: Secondary | ICD-10-CM | POA: Diagnosis not present

## 2023-10-19 DIAGNOSIS — I120 Hypertensive chronic kidney disease with stage 5 chronic kidney disease or end stage renal disease: Secondary | ICD-10-CM | POA: Diagnosis not present

## 2023-10-19 DIAGNOSIS — N186 End stage renal disease: Secondary | ICD-10-CM | POA: Diagnosis not present

## 2023-10-19 DIAGNOSIS — N2581 Secondary hyperparathyroidism of renal origin: Secondary | ICD-10-CM | POA: Diagnosis not present

## 2023-10-19 DIAGNOSIS — M25462 Effusion, left knee: Secondary | ICD-10-CM | POA: Diagnosis not present

## 2023-10-19 DIAGNOSIS — Z992 Dependence on renal dialysis: Secondary | ICD-10-CM | POA: Diagnosis not present

## 2023-10-21 DIAGNOSIS — E782 Mixed hyperlipidemia: Secondary | ICD-10-CM | POA: Diagnosis not present

## 2023-10-21 DIAGNOSIS — N186 End stage renal disease: Secondary | ICD-10-CM | POA: Diagnosis not present

## 2023-10-21 DIAGNOSIS — J449 Chronic obstructive pulmonary disease, unspecified: Secondary | ICD-10-CM | POA: Diagnosis not present

## 2023-10-21 DIAGNOSIS — Z992 Dependence on renal dialysis: Secondary | ICD-10-CM | POA: Diagnosis not present

## 2023-10-21 DIAGNOSIS — N2581 Secondary hyperparathyroidism of renal origin: Secondary | ICD-10-CM | POA: Diagnosis not present

## 2023-10-21 DIAGNOSIS — I10 Essential (primary) hypertension: Secondary | ICD-10-CM | POA: Diagnosis not present

## 2023-10-22 DIAGNOSIS — Z992 Dependence on renal dialysis: Secondary | ICD-10-CM | POA: Diagnosis not present

## 2023-10-22 DIAGNOSIS — N186 End stage renal disease: Secondary | ICD-10-CM | POA: Diagnosis not present

## 2023-10-24 DIAGNOSIS — N2581 Secondary hyperparathyroidism of renal origin: Secondary | ICD-10-CM | POA: Diagnosis not present

## 2023-10-24 DIAGNOSIS — Z992 Dependence on renal dialysis: Secondary | ICD-10-CM | POA: Diagnosis not present

## 2023-10-24 DIAGNOSIS — N186 End stage renal disease: Secondary | ICD-10-CM | POA: Diagnosis not present

## 2023-10-26 DIAGNOSIS — N2581 Secondary hyperparathyroidism of renal origin: Secondary | ICD-10-CM | POA: Diagnosis not present

## 2023-10-26 DIAGNOSIS — N186 End stage renal disease: Secondary | ICD-10-CM | POA: Diagnosis not present

## 2023-10-26 DIAGNOSIS — Z992 Dependence on renal dialysis: Secondary | ICD-10-CM | POA: Diagnosis not present

## 2023-10-27 DIAGNOSIS — N186 End stage renal disease: Secondary | ICD-10-CM | POA: Diagnosis not present

## 2023-10-27 DIAGNOSIS — Z992 Dependence on renal dialysis: Secondary | ICD-10-CM | POA: Diagnosis not present

## 2023-10-28 DIAGNOSIS — Z992 Dependence on renal dialysis: Secondary | ICD-10-CM | POA: Diagnosis not present

## 2023-10-28 DIAGNOSIS — N186 End stage renal disease: Secondary | ICD-10-CM | POA: Diagnosis not present

## 2023-10-28 DIAGNOSIS — N2581 Secondary hyperparathyroidism of renal origin: Secondary | ICD-10-CM | POA: Diagnosis not present

## 2023-10-31 DIAGNOSIS — Z992 Dependence on renal dialysis: Secondary | ICD-10-CM | POA: Diagnosis not present

## 2023-10-31 DIAGNOSIS — N186 End stage renal disease: Secondary | ICD-10-CM | POA: Diagnosis not present

## 2023-10-31 DIAGNOSIS — N2581 Secondary hyperparathyroidism of renal origin: Secondary | ICD-10-CM | POA: Diagnosis not present

## 2023-11-02 DIAGNOSIS — Z992 Dependence on renal dialysis: Secondary | ICD-10-CM | POA: Diagnosis not present

## 2023-11-02 DIAGNOSIS — N2581 Secondary hyperparathyroidism of renal origin: Secondary | ICD-10-CM | POA: Diagnosis not present

## 2023-11-02 DIAGNOSIS — N186 End stage renal disease: Secondary | ICD-10-CM | POA: Diagnosis not present

## 2023-11-04 DIAGNOSIS — N2581 Secondary hyperparathyroidism of renal origin: Secondary | ICD-10-CM | POA: Diagnosis not present

## 2023-11-04 DIAGNOSIS — N186 End stage renal disease: Secondary | ICD-10-CM | POA: Diagnosis not present

## 2023-11-04 DIAGNOSIS — Z992 Dependence on renal dialysis: Secondary | ICD-10-CM | POA: Diagnosis not present

## 2023-11-07 DIAGNOSIS — N2581 Secondary hyperparathyroidism of renal origin: Secondary | ICD-10-CM | POA: Diagnosis not present

## 2023-11-07 DIAGNOSIS — N186 End stage renal disease: Secondary | ICD-10-CM | POA: Diagnosis not present

## 2023-11-07 DIAGNOSIS — Z992 Dependence on renal dialysis: Secondary | ICD-10-CM | POA: Diagnosis not present

## 2023-11-09 DIAGNOSIS — N186 End stage renal disease: Secondary | ICD-10-CM | POA: Diagnosis not present

## 2023-11-09 DIAGNOSIS — Z992 Dependence on renal dialysis: Secondary | ICD-10-CM | POA: Diagnosis not present

## 2023-11-09 DIAGNOSIS — N2581 Secondary hyperparathyroidism of renal origin: Secondary | ICD-10-CM | POA: Diagnosis not present

## 2023-11-11 DIAGNOSIS — N186 End stage renal disease: Secondary | ICD-10-CM | POA: Diagnosis not present

## 2023-11-11 DIAGNOSIS — N2581 Secondary hyperparathyroidism of renal origin: Secondary | ICD-10-CM | POA: Diagnosis not present

## 2023-11-11 DIAGNOSIS — Z992 Dependence on renal dialysis: Secondary | ICD-10-CM | POA: Diagnosis not present

## 2023-11-14 DIAGNOSIS — N186 End stage renal disease: Secondary | ICD-10-CM | POA: Diagnosis not present

## 2023-11-14 DIAGNOSIS — N2581 Secondary hyperparathyroidism of renal origin: Secondary | ICD-10-CM | POA: Diagnosis not present

## 2023-11-14 DIAGNOSIS — Z992 Dependence on renal dialysis: Secondary | ICD-10-CM | POA: Diagnosis not present

## 2023-11-16 DIAGNOSIS — Z992 Dependence on renal dialysis: Secondary | ICD-10-CM | POA: Diagnosis not present

## 2023-11-16 DIAGNOSIS — N186 End stage renal disease: Secondary | ICD-10-CM | POA: Diagnosis not present

## 2023-11-16 DIAGNOSIS — N2581 Secondary hyperparathyroidism of renal origin: Secondary | ICD-10-CM | POA: Diagnosis not present

## 2023-11-18 DIAGNOSIS — N2581 Secondary hyperparathyroidism of renal origin: Secondary | ICD-10-CM | POA: Diagnosis not present

## 2023-11-18 DIAGNOSIS — Z992 Dependence on renal dialysis: Secondary | ICD-10-CM | POA: Diagnosis not present

## 2023-11-18 DIAGNOSIS — N186 End stage renal disease: Secondary | ICD-10-CM | POA: Diagnosis not present

## 2023-11-21 DIAGNOSIS — I10 Essential (primary) hypertension: Secondary | ICD-10-CM | POA: Diagnosis not present

## 2023-11-21 DIAGNOSIS — E782 Mixed hyperlipidemia: Secondary | ICD-10-CM | POA: Diagnosis not present

## 2023-11-21 DIAGNOSIS — N2581 Secondary hyperparathyroidism of renal origin: Secondary | ICD-10-CM | POA: Diagnosis not present

## 2023-11-21 DIAGNOSIS — J449 Chronic obstructive pulmonary disease, unspecified: Secondary | ICD-10-CM | POA: Diagnosis not present

## 2023-11-21 DIAGNOSIS — N186 End stage renal disease: Secondary | ICD-10-CM | POA: Diagnosis not present

## 2023-11-21 DIAGNOSIS — Z992 Dependence on renal dialysis: Secondary | ICD-10-CM | POA: Diagnosis not present

## 2023-11-22 DIAGNOSIS — M542 Cervicalgia: Secondary | ICD-10-CM | POA: Diagnosis not present

## 2023-11-22 DIAGNOSIS — N186 End stage renal disease: Secondary | ICD-10-CM | POA: Diagnosis not present

## 2023-11-22 DIAGNOSIS — Z79891 Long term (current) use of opiate analgesic: Secondary | ICD-10-CM | POA: Diagnosis not present

## 2023-11-22 DIAGNOSIS — G894 Chronic pain syndrome: Secondary | ICD-10-CM | POA: Diagnosis not present

## 2023-11-22 DIAGNOSIS — M549 Dorsalgia, unspecified: Secondary | ICD-10-CM | POA: Diagnosis not present

## 2023-11-22 DIAGNOSIS — M25569 Pain in unspecified knee: Secondary | ICD-10-CM | POA: Diagnosis not present

## 2023-11-22 DIAGNOSIS — M25512 Pain in left shoulder: Secondary | ICD-10-CM | POA: Diagnosis not present

## 2023-11-22 DIAGNOSIS — Z992 Dependence on renal dialysis: Secondary | ICD-10-CM | POA: Diagnosis not present

## 2023-11-22 DIAGNOSIS — M25511 Pain in right shoulder: Secondary | ICD-10-CM | POA: Diagnosis not present

## 2023-11-22 DIAGNOSIS — N2581 Secondary hyperparathyroidism of renal origin: Secondary | ICD-10-CM | POA: Diagnosis not present

## 2023-11-23 DIAGNOSIS — Z992 Dependence on renal dialysis: Secondary | ICD-10-CM | POA: Diagnosis not present

## 2023-11-23 DIAGNOSIS — N186 End stage renal disease: Secondary | ICD-10-CM | POA: Diagnosis not present

## 2023-11-23 DIAGNOSIS — N2581 Secondary hyperparathyroidism of renal origin: Secondary | ICD-10-CM | POA: Diagnosis not present

## 2023-11-25 DIAGNOSIS — N186 End stage renal disease: Secondary | ICD-10-CM | POA: Diagnosis not present

## 2023-11-25 DIAGNOSIS — N2581 Secondary hyperparathyroidism of renal origin: Secondary | ICD-10-CM | POA: Diagnosis not present

## 2023-11-25 DIAGNOSIS — Z992 Dependence on renal dialysis: Secondary | ICD-10-CM | POA: Diagnosis not present

## 2023-11-28 DIAGNOSIS — N2581 Secondary hyperparathyroidism of renal origin: Secondary | ICD-10-CM | POA: Diagnosis not present

## 2023-11-28 DIAGNOSIS — N186 End stage renal disease: Secondary | ICD-10-CM | POA: Diagnosis not present

## 2023-11-28 DIAGNOSIS — Z992 Dependence on renal dialysis: Secondary | ICD-10-CM | POA: Diagnosis not present

## 2023-11-30 DIAGNOSIS — Z992 Dependence on renal dialysis: Secondary | ICD-10-CM | POA: Diagnosis not present

## 2023-11-30 DIAGNOSIS — N186 End stage renal disease: Secondary | ICD-10-CM | POA: Diagnosis not present

## 2023-11-30 DIAGNOSIS — N2581 Secondary hyperparathyroidism of renal origin: Secondary | ICD-10-CM | POA: Diagnosis not present

## 2023-12-02 DIAGNOSIS — N186 End stage renal disease: Secondary | ICD-10-CM | POA: Diagnosis not present

## 2023-12-02 DIAGNOSIS — Z992 Dependence on renal dialysis: Secondary | ICD-10-CM | POA: Diagnosis not present

## 2023-12-02 DIAGNOSIS — N2581 Secondary hyperparathyroidism of renal origin: Secondary | ICD-10-CM | POA: Diagnosis not present

## 2023-12-05 DIAGNOSIS — N2581 Secondary hyperparathyroidism of renal origin: Secondary | ICD-10-CM | POA: Diagnosis not present

## 2023-12-05 DIAGNOSIS — N186 End stage renal disease: Secondary | ICD-10-CM | POA: Diagnosis not present

## 2023-12-05 DIAGNOSIS — Z992 Dependence on renal dialysis: Secondary | ICD-10-CM | POA: Diagnosis not present

## 2023-12-07 DIAGNOSIS — Z992 Dependence on renal dialysis: Secondary | ICD-10-CM | POA: Diagnosis not present

## 2023-12-07 DIAGNOSIS — N2581 Secondary hyperparathyroidism of renal origin: Secondary | ICD-10-CM | POA: Diagnosis not present

## 2023-12-07 DIAGNOSIS — N186 End stage renal disease: Secondary | ICD-10-CM | POA: Diagnosis not present

## 2023-12-09 DIAGNOSIS — N2581 Secondary hyperparathyroidism of renal origin: Secondary | ICD-10-CM | POA: Diagnosis not present

## 2023-12-09 DIAGNOSIS — N186 End stage renal disease: Secondary | ICD-10-CM | POA: Diagnosis not present

## 2023-12-09 DIAGNOSIS — Z992 Dependence on renal dialysis: Secondary | ICD-10-CM | POA: Diagnosis not present

## 2023-12-12 DIAGNOSIS — N186 End stage renal disease: Secondary | ICD-10-CM | POA: Diagnosis not present

## 2023-12-12 DIAGNOSIS — N2581 Secondary hyperparathyroidism of renal origin: Secondary | ICD-10-CM | POA: Diagnosis not present

## 2023-12-12 DIAGNOSIS — Z992 Dependence on renal dialysis: Secondary | ICD-10-CM | POA: Diagnosis not present

## 2023-12-14 DIAGNOSIS — Z992 Dependence on renal dialysis: Secondary | ICD-10-CM | POA: Diagnosis not present

## 2023-12-14 DIAGNOSIS — N2581 Secondary hyperparathyroidism of renal origin: Secondary | ICD-10-CM | POA: Diagnosis not present

## 2023-12-14 DIAGNOSIS — N186 End stage renal disease: Secondary | ICD-10-CM | POA: Diagnosis not present

## 2023-12-16 DIAGNOSIS — N2581 Secondary hyperparathyroidism of renal origin: Secondary | ICD-10-CM | POA: Diagnosis not present

## 2023-12-16 DIAGNOSIS — N186 End stage renal disease: Secondary | ICD-10-CM | POA: Diagnosis not present

## 2023-12-16 DIAGNOSIS — Z992 Dependence on renal dialysis: Secondary | ICD-10-CM | POA: Diagnosis not present

## 2023-12-19 DIAGNOSIS — Z992 Dependence on renal dialysis: Secondary | ICD-10-CM | POA: Diagnosis not present

## 2023-12-19 DIAGNOSIS — N2581 Secondary hyperparathyroidism of renal origin: Secondary | ICD-10-CM | POA: Diagnosis not present

## 2023-12-19 DIAGNOSIS — N186 End stage renal disease: Secondary | ICD-10-CM | POA: Diagnosis not present

## 2023-12-21 DIAGNOSIS — N2581 Secondary hyperparathyroidism of renal origin: Secondary | ICD-10-CM | POA: Diagnosis not present

## 2023-12-21 DIAGNOSIS — E782 Mixed hyperlipidemia: Secondary | ICD-10-CM | POA: Diagnosis not present

## 2023-12-21 DIAGNOSIS — I10 Essential (primary) hypertension: Secondary | ICD-10-CM | POA: Diagnosis not present

## 2023-12-21 DIAGNOSIS — Z992 Dependence on renal dialysis: Secondary | ICD-10-CM | POA: Diagnosis not present

## 2023-12-21 DIAGNOSIS — J449 Chronic obstructive pulmonary disease, unspecified: Secondary | ICD-10-CM | POA: Diagnosis not present

## 2023-12-21 DIAGNOSIS — N186 End stage renal disease: Secondary | ICD-10-CM | POA: Diagnosis not present

## 2023-12-22 DIAGNOSIS — Z0001 Encounter for general adult medical examination with abnormal findings: Secondary | ICD-10-CM | POA: Diagnosis not present

## 2023-12-22 DIAGNOSIS — E7849 Other hyperlipidemia: Secondary | ICD-10-CM | POA: Diagnosis not present

## 2023-12-22 DIAGNOSIS — N186 End stage renal disease: Secondary | ICD-10-CM | POA: Diagnosis not present

## 2023-12-22 DIAGNOSIS — R5382 Chronic fatigue, unspecified: Secondary | ICD-10-CM | POA: Diagnosis not present

## 2023-12-22 DIAGNOSIS — I10 Essential (primary) hypertension: Secondary | ICD-10-CM | POA: Diagnosis not present

## 2023-12-22 DIAGNOSIS — Z992 Dependence on renal dialysis: Secondary | ICD-10-CM | POA: Diagnosis not present

## 2023-12-23 DIAGNOSIS — Z992 Dependence on renal dialysis: Secondary | ICD-10-CM | POA: Diagnosis not present

## 2023-12-23 DIAGNOSIS — N2581 Secondary hyperparathyroidism of renal origin: Secondary | ICD-10-CM | POA: Diagnosis not present

## 2023-12-23 DIAGNOSIS — N186 End stage renal disease: Secondary | ICD-10-CM | POA: Diagnosis not present

## 2023-12-26 DIAGNOSIS — N2581 Secondary hyperparathyroidism of renal origin: Secondary | ICD-10-CM | POA: Diagnosis not present

## 2023-12-26 DIAGNOSIS — Z992 Dependence on renal dialysis: Secondary | ICD-10-CM | POA: Diagnosis not present

## 2023-12-26 DIAGNOSIS — N186 End stage renal disease: Secondary | ICD-10-CM | POA: Diagnosis not present

## 2023-12-27 DIAGNOSIS — M1712 Unilateral primary osteoarthritis, left knee: Secondary | ICD-10-CM | POA: Diagnosis not present

## 2023-12-27 DIAGNOSIS — Z992 Dependence on renal dialysis: Secondary | ICD-10-CM | POA: Diagnosis not present

## 2023-12-27 DIAGNOSIS — J449 Chronic obstructive pulmonary disease, unspecified: Secondary | ICD-10-CM | POA: Diagnosis not present

## 2023-12-27 DIAGNOSIS — I120 Hypertensive chronic kidney disease with stage 5 chronic kidney disease or end stage renal disease: Secondary | ICD-10-CM | POA: Diagnosis not present

## 2023-12-28 DIAGNOSIS — N186 End stage renal disease: Secondary | ICD-10-CM | POA: Diagnosis not present

## 2023-12-28 DIAGNOSIS — Z992 Dependence on renal dialysis: Secondary | ICD-10-CM | POA: Diagnosis not present

## 2023-12-28 DIAGNOSIS — N2581 Secondary hyperparathyroidism of renal origin: Secondary | ICD-10-CM | POA: Diagnosis not present

## 2023-12-30 DIAGNOSIS — N2581 Secondary hyperparathyroidism of renal origin: Secondary | ICD-10-CM | POA: Diagnosis not present

## 2023-12-30 DIAGNOSIS — Z992 Dependence on renal dialysis: Secondary | ICD-10-CM | POA: Diagnosis not present

## 2023-12-30 DIAGNOSIS — N186 End stage renal disease: Secondary | ICD-10-CM | POA: Diagnosis not present

## 2024-01-02 DIAGNOSIS — N186 End stage renal disease: Secondary | ICD-10-CM | POA: Diagnosis not present

## 2024-01-02 DIAGNOSIS — Z992 Dependence on renal dialysis: Secondary | ICD-10-CM | POA: Diagnosis not present

## 2024-01-02 DIAGNOSIS — N2581 Secondary hyperparathyroidism of renal origin: Secondary | ICD-10-CM | POA: Diagnosis not present

## 2024-01-04 DIAGNOSIS — N186 End stage renal disease: Secondary | ICD-10-CM | POA: Diagnosis not present

## 2024-01-04 DIAGNOSIS — N2581 Secondary hyperparathyroidism of renal origin: Secondary | ICD-10-CM | POA: Diagnosis not present

## 2024-01-04 DIAGNOSIS — Z992 Dependence on renal dialysis: Secondary | ICD-10-CM | POA: Diagnosis not present

## 2024-01-06 DIAGNOSIS — N186 End stage renal disease: Secondary | ICD-10-CM | POA: Diagnosis not present

## 2024-01-06 DIAGNOSIS — Z992 Dependence on renal dialysis: Secondary | ICD-10-CM | POA: Diagnosis not present

## 2024-01-06 DIAGNOSIS — N2581 Secondary hyperparathyroidism of renal origin: Secondary | ICD-10-CM | POA: Diagnosis not present

## 2024-01-09 DIAGNOSIS — N2581 Secondary hyperparathyroidism of renal origin: Secondary | ICD-10-CM | POA: Diagnosis not present

## 2024-01-09 DIAGNOSIS — Z992 Dependence on renal dialysis: Secondary | ICD-10-CM | POA: Diagnosis not present

## 2024-01-09 DIAGNOSIS — N186 End stage renal disease: Secondary | ICD-10-CM | POA: Diagnosis not present

## 2024-01-11 DIAGNOSIS — Z992 Dependence on renal dialysis: Secondary | ICD-10-CM | POA: Diagnosis not present

## 2024-01-11 DIAGNOSIS — N2581 Secondary hyperparathyroidism of renal origin: Secondary | ICD-10-CM | POA: Diagnosis not present

## 2024-01-11 DIAGNOSIS — N186 End stage renal disease: Secondary | ICD-10-CM | POA: Diagnosis not present

## 2024-01-13 DIAGNOSIS — N2581 Secondary hyperparathyroidism of renal origin: Secondary | ICD-10-CM | POA: Diagnosis not present

## 2024-01-13 DIAGNOSIS — Z992 Dependence on renal dialysis: Secondary | ICD-10-CM | POA: Diagnosis not present

## 2024-01-13 DIAGNOSIS — N186 End stage renal disease: Secondary | ICD-10-CM | POA: Diagnosis not present

## 2024-01-16 ENCOUNTER — Encounter: Payer: Self-pay | Admitting: Internal Medicine

## 2024-01-16 ENCOUNTER — Ambulatory Visit: Attending: Internal Medicine | Admitting: Internal Medicine

## 2024-01-16 VITALS — BP 105/67 | HR 76 | Ht 72.0 in | Wt 259.0 lb

## 2024-01-16 DIAGNOSIS — N186 End stage renal disease: Secondary | ICD-10-CM | POA: Diagnosis not present

## 2024-01-16 DIAGNOSIS — Z992 Dependence on renal dialysis: Secondary | ICD-10-CM | POA: Diagnosis not present

## 2024-01-16 DIAGNOSIS — N2581 Secondary hyperparathyroidism of renal origin: Secondary | ICD-10-CM | POA: Diagnosis not present

## 2024-01-16 DIAGNOSIS — I471 Supraventricular tachycardia, unspecified: Secondary | ICD-10-CM | POA: Diagnosis not present

## 2024-01-16 NOTE — Patient Instructions (Signed)
 Medication Instructions:  Your physician recommends that you continue on your current medications as directed. Please refer to the Current Medication list given to you today.  *If you need a refill on your cardiac medications before your next appointment, please call your pharmacy*  Lab Work: NONE   If you have labs (blood work) drawn today and your tests are completely normal, you will receive your results only by: MyChart Message (if you have MyChart) OR A paper copy in the mail If you have any lab test that is abnormal or we need to change your treatment, we will call you to review the results.  Testing/Procedures: NONE   Follow-Up: At [facility], you and your health needs are our priority.  As part of our continuing mission to provide you with exceptional heart care, our providers are all part of one team.  This team includes your primary Cardiologist (physician) and Advanced Practice Providers or APPs (Physician Assistants and Nurse Practitioners) who all work together to provide you with the care you need, when you need it.  Your next appointment:    As Needed   Provider:   Dr. Almetta     We recommend signing up for the patient portal called MyChart.  Sign up information is provided on this After Visit Summary.  MyChart is used to connect with patients for Virtual Visits (Telemedicine).  Patients are able to view lab/test results, encounter notes, upcoming appointments, etc.  Non-urgent messages can be sent to your provider as well.   To learn more about what you can do with MyChart, go to [email or web address].   Other Instructions Thank you for choosing [facility]!

## 2024-01-16 NOTE — Progress Notes (Signed)
 HPI Mr. Dominic returns for ongoing evaluation of SVT. He is a pleasant 53 yo man with ESRD on HD, who has developed SVT about a year ago. The patient has not had syncope. He had been to the ED 6 times for SVT and has received IV adenosine . He was started on a beta blocker and his symptoms have improved. He has not had an episode since starting metoprolol . He feels well.  Not on File   Current Outpatient Medications  Medication Sig Dispense Refill   acetaminophen  (TYLENOL ) 500 MG tablet Take 1,000 mg by mouth every 6 (six) hours as needed for mild pain (pain score 1-3), moderate pain (pain score 4-6) or headache.     albuterol  (ACCUNEB ) 1.25 MG/3ML nebulizer solution Take 1 ampule by nebulization every 4 (four) hours as needed for wheezing or shortness of breath.     aspirin EC 81 MG tablet Take 81 mg by mouth in the morning.     atorvastatin (LIPITOR) 10 MG tablet Take 10 mg by mouth every evening.     AURYXIA 1 GM 210 MG(Fe) tablet Take 420 mg by mouth 3 (three) times daily with meals. And 420 mg with each snack     cinacalcet  (SENSIPAR ) 60 MG tablet Take 60 mg by mouth every Monday, Wednesday, and Friday with hemodialysis.     diphenhydramine -acetaminophen  (TYLENOL  PM) 25-500 MG TABS tablet Take 1 tablet by mouth at bedtime as needed (sleep).     DULoxetine (CYMBALTA) 60 MG capsule Take 60 mg by mouth in the morning.     lidocaine -prilocaine (EMLA) cream Apply 1 Application topically daily as needed (fistula acces).     metoprolol  tartrate (LOPRESSOR ) 25 MG tablet Take 3 tablets (75 mg total) by mouth 2 (two) times daily. 180 tablet 11   midodrine (PROAMATINE) 5 MG tablet Take 5 mg by mouth every Monday, Wednesday, and Friday.     Multiple Vitamins-Minerals (MULTIVITAMIN WITH MINERALS) tablet Take 1 tablet by mouth daily. Men 50+     Omega-3 1000 MG CAPS Take 2,000 mg by mouth daily.     oxyCODONE -acetaminophen  (PERCOCET) 10-325 MG tablet Take 1 tablet by mouth every 4 (four) hours  as needed for pain.     pregabalin (LYRICA) 100 MG capsule Take 100 mg by mouth 2 (two) times daily.     sevelamer carbonate (RENVELA) 800 MG tablet Take 1,600-3,200 mg by mouth See admin instructions. Take 3200 mg with meals and 1600 with snack     sildenafil (REVATIO) 20 MG tablet Take 40-60 mg by mouth daily as needed (ED).     SYMBICORT  160-4.5 MCG/ACT inhaler Inhale 2 puffs into the lungs 2 (two) times daily as needed (shortness of breath).     No current facility-administered medications for this visit.     Past Medical History:  Diagnosis Date   Anemia    Attributed to chronic disease/chronic kidney disease   Asthma    CHF (congestive heart failure) (HCC)    Chronic diastolic heart failure (HCC) 05/24/2009   EF 35% by echo in 2011; echo in 08/2011-normal EF, moderate to severe LVH; BNP level of [phone] in 2011-12   Chronic obstructive pulmonary disease (HCC)    with asthmatic component   Dysrhythmia    ESRD (end stage renal disease) on dialysis (HCC)    secondary hyperparathyroidism   Hypertension    h/o hypertensive crisis with encephalopathy   Obesity    Tobacco abuse    Approximate consumption of 25  pack years; continuing at 0.5 pack per day    ROS:   All systems reviewed and negative except as noted in the HPI.   Past Surgical History:  Procedure Laterality Date   A/V FISTULAGRAM Left 09/15/2023   Procedure: A/V Fistulagram;  Surgeon: Gretta Lonni PARAS, MD;  Location: [facility] INVASIVE CV LAB;  Service: Cardiovascular;  Laterality: Left;   A/V SHUNT INTERVENTION N/A 10/13/2023   Procedure: A/V SHUNT INTERVENTION;  Surgeon: Lanis Fonda BRAVO, MD;  Location: [facility] LAB;  Service: Cardiovascular;  Laterality: N/A;   ARTERIOVENOUS GRAFT PLACEMENT  05/24/2009   AV FISTULA PLACEMENT Left 05/03/2023   Procedure: LEFT ARM BASILIC VEIN FISTULA CREATION;  Surgeon: Sheree Penne Lonni, MD;  Location: [facility] OR;  Service: Vascular;  Laterality: Left;   BASCILIC VEIN  TRANSPOSITION Left 06/28/2023   Procedure: LEFT ARM BASILIC VEIN TRANSPOSITION;  Surgeon: Sheree Penne Lonni, MD;  Location: [facility] OR;  Service: Vascular;  Laterality: Left;   COLONOSCOPY N/A 02/04/2014   Procedure: COLONOSCOPY;  Surgeon: Lamar CHRISTELLA Hollingshead, MD;  [facility];  Service: Endoscopy;  Laterality: N/A;  12:00   COLONOSCOPY N/A 07/03/2019   Procedure: COLONOSCOPY;  Surgeon: Hollingshead Lamar CHRISTELLA, MD;  [facility];  Service: Endoscopy;  Laterality: N/A;  12:00-office rescheduled 2/9 @ 11:00am   INSERTION OF DIALYSIS CATHETER Right 03/01/2023   Procedure: INSERTION OF RIGHT INTERNAL JUGULAR TUNNELED DIALYSIS CATHETER;  Surgeon: Sheree Penne Lonni, MD;  Location: [facility] OR;  Service: Vascular;  Laterality: Right;   IR FLUORO GUIDE CV LINE RIGHT  04/04/2023   IR FLUORO GUIDE CV LINE RIGHT  07/08/2023   IR FLUORO GUIDE CV LINE RIGHT  08/30/2023   IR US  GUIDE VASC ACCESS RIGHT  07/08/2023   POLYPECTOMY  07/03/2019   Procedure: POLYPECTOMY;  Surgeon: Hollingshead Lamar CHRISTELLA, MD;  [facility];  Service: Endoscopy;;   REVISON OF ARTERIOVENOUS FISTULA Right 01/25/2023   Procedure: REVISION OF RIGHT ARM ARTERIOVENOUS FISTULA;  Surgeon: Sheree Penne Lonni, MD;  Location: [facility] OR;  Service: Vascular;  Laterality: Right;   REVISON OF ARTERIOVENOUS FISTULA Right 03/01/2023   Procedure: INSERTION OF ARTERIOVENOUS GRAFT USING ARTEGRAFT  X 42CM;  Surgeon: Sheree Penne Lonni, MD;  Location: [facility] OR;  Service: Vascular;  Laterality: Right;   REVISON OF ARTERIOVENOUS FISTULA Right 06/28/2023   Procedure: EXCISION OF RIGHT ARTERIOVENOUS FISTULA PSEUDOANEURYSM;  Surgeon: Sheree Penne Lonni, MD;  Location: [facility] OR;  Service: Vascular;  Laterality: Right;   THROMBECTOMY W/ EMBOLECTOMY  01/25/2023   Procedure: THROMBECTOMY RIGHT ARTERIOVENOUS FISTULA WITH BALLOON ANGIOPLASTY;  Surgeon: Sheree Penne Lonni, MD;  Location: [facility] OR;  Service: Vascular;;   UPPER  EXTREMITY INTERVENTION  09/15/2023   Procedure: UPPER EXTREMITY INTERVENTION;  Surgeon: Gretta Lonni PARAS, MD;  Location: [facility] INVASIVE CV LAB;  Service: Cardiovascular;;   UPPER EXTREMITY VENOGRAPHY Bilateral 04/07/2023   Procedure: UPPER EXTREMITY VENOGRAPHY;  Surgeon: Gretta Lonni PARAS, MD;  Location: [facility] INVASIVE CV LAB;  Service: Cardiovascular;  Laterality: Bilateral;   VENOGRAM Right 01/25/2023   Procedure: RIGHT ARM FISTULOGRAM;  Surgeon: Sheree Penne Lonni, MD;  Location: [facility] OR;  Service: Vascular;  Laterality: Right;   VENOUS ANGIOPLASTY  10/13/2023   Procedure: VENOUS ANGIOPLASTY;  Surgeon: Lanis Fonda BRAVO, MD;  Location: [facility];  Service: Cardiovascular;;     Family History  Problem Relation Age of Onset   Heart disease Father    Diabetes Sister    Hypertension Sister    Diabetes Sister  Hypertension Sister    Kidney disease Mother    Hypertension Mother    Hypertension Sister    Hypertension Brother    Hypertension Brother    Hypertension Brother    Colon cancer Neg Hx      Social History   Socioeconomic History   Marital status: Single    Spouse name: Not on file   Number of children: 3   Years of education: Not on file   Highest education level: Not on file  Occupational History   Occupation: Disability  Tobacco Use   Smoking status: Every Day    Current packs/day: 0.50    Average packs/day: 0.5 packs/day for 38.3 years (19.2 ttl pk-yrs)    Types: Cigarettes    Start date: 09/23/1985   Smokeless tobacco: Never   Tobacco comments:    8-10 ciggs per day   Vaping Use   Vaping status: Never Used  Substance and Sexual Activity   Alcohol  use: Not Currently    Comment: quit etoh about 2011. used to drink about 8-10 beers a week   Drug use: No   Sexual activity: Not on file  Other Topics Concern   Not on file  Social History Narrative   Not on file   Social Drivers of Health   Financial Resource Strain: Not on file  Food Insecurity:  Not on file  Transportation Needs: Not on file  Physical Activity: Not on file  Stress: Not on file  Social Connections: Not on file  Intimate Partner Violence: Not on file     BP 105/67 (BP Location: Right Arm, Cuff Size: Large)   Pulse 76   Ht 6' (1.829 m)   Wt 259 lb (117.5 kg)   SpO2 97%   BMI 35.13 kg/m   Physical Exam:  Well appearing NAD HEENT: Unremarkable Neck:  No JVD, no thyromegally Lymphatics:  No adenopathy Back:  No CVA tenderness Lungs:  Clear HEART:  Regular rate rhythm, no murmurs, no rubs, no clicks Abd:  soft, positive bowel sounds, no organomegally, no rebound, no guarding Ext:  2 plus pulses, no edema, no cyanosis, no clubbing Skin:  No rashes no nodules Neuro:  CN II through XII intact, motor grossly intact  DEVICE  Normal device function.  See PaceArt for details.   Assess/Plan:  SVT - I have discussed the treatment options with the patient. As he has done so well on beta blocker therapy and also has HTN, he would like to continue with his beta blocker and reserve ablation if his symptoms return or if he becomes intolerant.  HTN - continue beta blocker and other meds. ESRD - he is tolerating HD.   Danelle Ricky Gallery,MD

## 2024-01-17 DIAGNOSIS — Z79891 Long term (current) use of opiate analgesic: Secondary | ICD-10-CM | POA: Diagnosis not present

## 2024-01-17 DIAGNOSIS — M549 Dorsalgia, unspecified: Secondary | ICD-10-CM | POA: Diagnosis not present

## 2024-01-17 DIAGNOSIS — M25511 Pain in right shoulder: Secondary | ICD-10-CM | POA: Diagnosis not present

## 2024-01-17 DIAGNOSIS — M542 Cervicalgia: Secondary | ICD-10-CM | POA: Diagnosis not present

## 2024-01-17 DIAGNOSIS — M25512 Pain in left shoulder: Secondary | ICD-10-CM | POA: Diagnosis not present

## 2024-01-17 DIAGNOSIS — M25569 Pain in unspecified knee: Secondary | ICD-10-CM | POA: Diagnosis not present

## 2024-01-17 DIAGNOSIS — G894 Chronic pain syndrome: Secondary | ICD-10-CM | POA: Diagnosis not present

## 2024-01-18 DIAGNOSIS — N2581 Secondary hyperparathyroidism of renal origin: Secondary | ICD-10-CM | POA: Diagnosis not present

## 2024-01-18 DIAGNOSIS — Z992 Dependence on renal dialysis: Secondary | ICD-10-CM | POA: Diagnosis not present

## 2024-01-18 DIAGNOSIS — N186 End stage renal disease: Secondary | ICD-10-CM | POA: Diagnosis not present

## 2024-01-20 ENCOUNTER — Ambulatory Visit: Admitting: Internal Medicine

## 2024-01-20 DIAGNOSIS — N2581 Secondary hyperparathyroidism of renal origin: Secondary | ICD-10-CM | POA: Diagnosis not present

## 2024-01-20 DIAGNOSIS — I10 Essential (primary) hypertension: Secondary | ICD-10-CM | POA: Diagnosis not present

## 2024-01-20 DIAGNOSIS — J449 Chronic obstructive pulmonary disease, unspecified: Secondary | ICD-10-CM | POA: Diagnosis not present

## 2024-01-20 DIAGNOSIS — E782 Mixed hyperlipidemia: Secondary | ICD-10-CM | POA: Diagnosis not present

## 2024-01-20 DIAGNOSIS — N186 End stage renal disease: Secondary | ICD-10-CM | POA: Diagnosis not present

## 2024-01-20 DIAGNOSIS — Z992 Dependence on renal dialysis: Secondary | ICD-10-CM | POA: Diagnosis not present

## 2024-01-22 DIAGNOSIS — Z992 Dependence on renal dialysis: Secondary | ICD-10-CM | POA: Diagnosis not present

## 2024-01-22 DIAGNOSIS — N186 End stage renal disease: Secondary | ICD-10-CM | POA: Diagnosis not present

## 2024-01-23 DIAGNOSIS — I5032 Chronic diastolic (congestive) heart failure: Secondary | ICD-10-CM | POA: Diagnosis not present

## 2024-01-23 DIAGNOSIS — N2581 Secondary hyperparathyroidism of renal origin: Secondary | ICD-10-CM | POA: Diagnosis not present

## 2024-01-23 DIAGNOSIS — Z992 Dependence on renal dialysis: Secondary | ICD-10-CM | POA: Diagnosis not present

## 2024-01-25 DIAGNOSIS — N186 End stage renal disease: Secondary | ICD-10-CM | POA: Diagnosis not present

## 2024-01-30 DIAGNOSIS — Z992 Dependence on renal dialysis: Secondary | ICD-10-CM | POA: Diagnosis not present

## 2024-01-30 DIAGNOSIS — N186 End stage renal disease: Secondary | ICD-10-CM | POA: Diagnosis not present

## 2024-01-30 DIAGNOSIS — N2581 Secondary hyperparathyroidism of renal origin: Secondary | ICD-10-CM | POA: Diagnosis not present

## 2024-02-01 DIAGNOSIS — N2581 Secondary hyperparathyroidism of renal origin: Secondary | ICD-10-CM | POA: Diagnosis not present

## 2024-02-01 DIAGNOSIS — Z992 Dependence on renal dialysis: Secondary | ICD-10-CM | POA: Diagnosis not present

## 2024-02-02 NOTE — Progress Notes (Deleted)
   Cardiology Office Note    Date:  02/02/2024  ID:  Brian Barrera, DOB Aug 16, 1970, MRN [id number] PCP:  Lari Elspeth FORBES, MD  Cardiologist:  Alvan Carrier, MD  Electrophysiologist:  None   Chief Complaint: ***  History of Present Illness: .    Brian Barrera is a 53 y.o. male with visit-pertinent history of ESRD on HD with hypotension requiring midodrine, chronic HFimpEF, HLD, HTN, SVT, anemia, tobacco abuse, obesity seen for follow-up. He is reported to have had prior EF 35% in 2011 but improved since then. Nuc 03/2019 showed defect suggestive of soft tissue attenuation, cannot exclude a mild inferior apical ischemic territory, although with reduced specificity, low risk study, EF 53%. Last echo 12/2021 EF 50-55%, global HK, moderate LAE. He had issues with recurrent PSVT in early 2025, see 06/27/23 OV for details. Metoprolol  was increased to 75mg  BID. He saw Dr. Waddell in 12/2023 and was doing well on this regimen with plan to continue, reserving ablation for refractory symptoms.   I met 06/2023 PCP mgm'd thyroid    PSVT Chronic HFimpEF Essential HTN with hx of hypotension ESRD on HD  Labwork independently reviewed: 09/2023 [facility] TSH 0.317, free T4 low 0.81 (PCP managing) 08/2023 K 5.0, Cr 12.4 06/2023 TSH 0.21, free T4 ok -> advised to f/u PCP 05/2023 CBC   ROS: .    Please see the history of present illness. Otherwise, review of systems is positive for ***.  All other systems are reviewed and otherwise negative.  Studies Reviewed: SABRA    EKG:  EKG is ordered today, personally reviewed, demonstrating ***  CV Studies: Cardiac studies reviewed are outlined and summarized above. Otherwise please see EMR for full report.   Current Reported Medications:.    No outpatient medications have been marked as taking for the 02/03/24 encounter (Appointment) with Gianpaolo Mindel N, PA-C.    Physical Exam:    VS:  There were no vitals taken for this visit.   Wt Readings from Last 3  Encounters:  01/16/24 259 lb (117.5 kg)  09/15/23 270 lb (122.5 kg)  07/08/23 270 lb (122.5 kg)    GEN: Well nourished, well developed in no acute distress NECK: No JVD; No carotid bruits CARDIAC: ***RRR, no murmurs, rubs, gallops RESPIRATORY:  Clear to auscultation without rales, wheezing or rhonchi  ABDOMEN: Soft, non-tender, non-distended EXTREMITIES:  No edema; No acute deformity   Asessement and Plan:.     ***     Disposition: F/u with ***  Signed, Dorsel Flinn N Rayme Bui, PA-C

## 2024-02-03 ENCOUNTER — Ambulatory Visit: Attending: Physician Assistant | Admitting: Physician Assistant

## 2024-02-03 DIAGNOSIS — I5032 Chronic diastolic (congestive) heart failure: Secondary | ICD-10-CM

## 2024-02-03 DIAGNOSIS — I959 Hypotension, unspecified: Secondary | ICD-10-CM

## 2024-02-03 DIAGNOSIS — N186 End stage renal disease: Secondary | ICD-10-CM

## 2024-02-03 DIAGNOSIS — I10 Essential (primary) hypertension: Secondary | ICD-10-CM

## 2024-02-03 DIAGNOSIS — I471 Supraventricular tachycardia, unspecified: Secondary | ICD-10-CM

## 2024-02-06 DIAGNOSIS — Z992 Dependence on renal dialysis: Secondary | ICD-10-CM | POA: Diagnosis not present

## 2024-02-06 DIAGNOSIS — N2581 Secondary hyperparathyroidism of renal origin: Secondary | ICD-10-CM | POA: Diagnosis not present

## 2024-02-07 DIAGNOSIS — Z1329 Encounter for screening for other suspected endocrine disorder: Secondary | ICD-10-CM | POA: Diagnosis not present

## 2024-02-07 DIAGNOSIS — I471 Supraventricular tachycardia, unspecified: Secondary | ICD-10-CM | POA: Insufficient documentation

## 2024-02-07 DIAGNOSIS — R5382 Chronic fatigue, unspecified: Secondary | ICD-10-CM | POA: Diagnosis not present

## 2024-02-07 DIAGNOSIS — Z0001 Encounter for general adult medical examination with abnormal findings: Secondary | ICD-10-CM | POA: Diagnosis not present

## 2024-02-10 DIAGNOSIS — N2581 Secondary hyperparathyroidism of renal origin: Secondary | ICD-10-CM | POA: Diagnosis not present

## 2024-02-13 DIAGNOSIS — Z992 Dependence on renal dialysis: Secondary | ICD-10-CM | POA: Diagnosis not present

## 2024-02-13 DIAGNOSIS — N186 End stage renal disease: Secondary | ICD-10-CM | POA: Diagnosis not present

## 2024-02-13 DIAGNOSIS — N2581 Secondary hyperparathyroidism of renal origin: Secondary | ICD-10-CM | POA: Diagnosis not present

## 2024-02-16 DIAGNOSIS — M1712 Unilateral primary osteoarthritis, left knee: Secondary | ICD-10-CM | POA: Diagnosis not present

## 2024-02-16 DIAGNOSIS — J449 Chronic obstructive pulmonary disease, unspecified: Secondary | ICD-10-CM | POA: Diagnosis not present

## 2024-02-16 DIAGNOSIS — I5032 Chronic diastolic (congestive) heart failure: Secondary | ICD-10-CM | POA: Diagnosis not present

## 2024-02-16 DIAGNOSIS — I471 Supraventricular tachycardia, unspecified: Secondary | ICD-10-CM | POA: Diagnosis not present

## 2024-02-16 DIAGNOSIS — I10 Essential (primary) hypertension: Secondary | ICD-10-CM | POA: Diagnosis not present

## 2024-02-16 DIAGNOSIS — M51369 Other intervertebral disc degeneration, lumbar region without mention of lumbar back pain or lower extremity pain: Secondary | ICD-10-CM | POA: Diagnosis not present

## 2024-02-16 DIAGNOSIS — N186 End stage renal disease: Secondary | ICD-10-CM | POA: Diagnosis not present

## 2024-02-16 DIAGNOSIS — E782 Mixed hyperlipidemia: Secondary | ICD-10-CM | POA: Diagnosis not present

## 2024-02-16 IMAGING — DX DG CHEST 1V PORT
1 series · 1 of 1 positions shown · non-contrast
Comparison: Chest CT 10/15/2021.  Chest radiograph 09/29/2020.

CLINICAL DATA: Provided history: Elevated heart rate, chest
pressure, hypertension, smoker.

EXAM:
PORTABLE CHEST 1 VIEW

[chest ap]
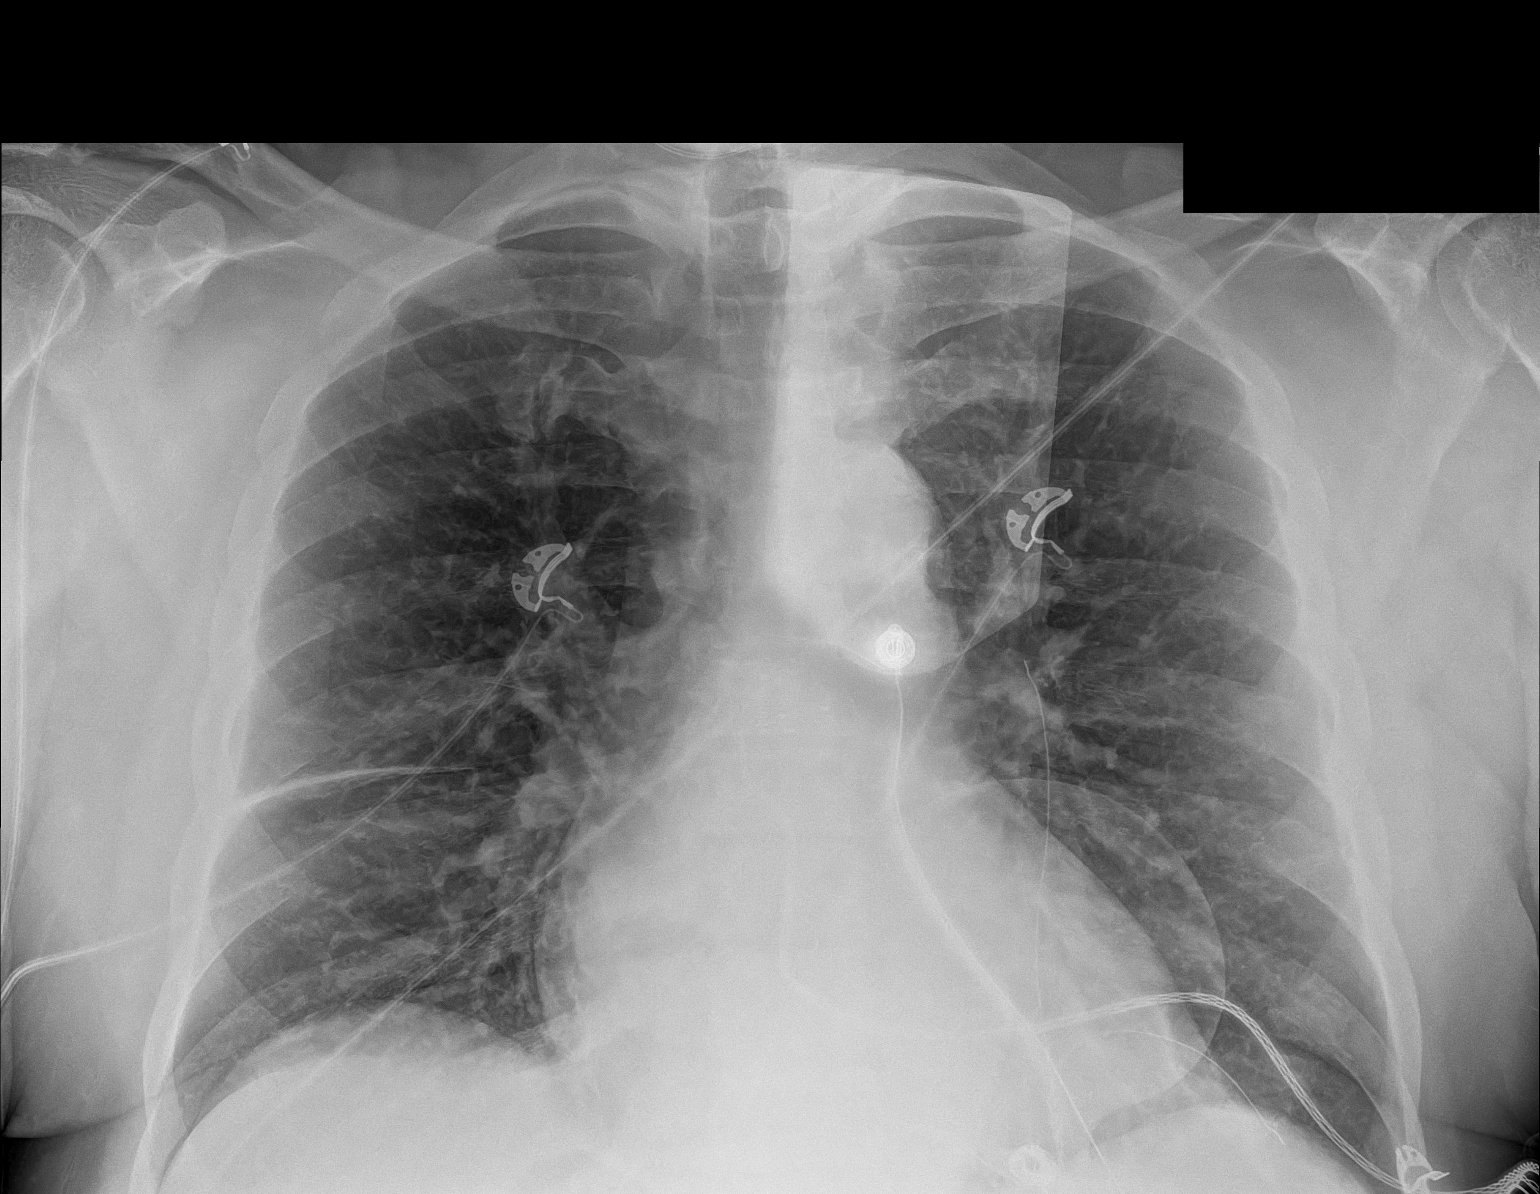

[1 of 1 positions shown; findings below may reference images not displayed]

FINDINGS: Cardiac monitoring pads overlie the chest. Heart size within normal
limits. Aortic atherosclerosis no appreciable airspace consolidation
or pulmonary edema. A small amount of fluid is questioned within the
minor fissure. A portion of the left lateral costophrenic angle is
excluded from the field of view. Within this limitation, there is no
evidence of pleural effusion. No evidence of pneumothorax. No
evidence of acute bony abnormality.
IMPRESSION: A portion of the left lateral costophrenic angle is excluded from
the field of view.

No appreciable airspace consolidation or pulmonary edema.

A small amount of fluid may be present within the minor fissure.

Aortic Atherosclerosis ([id number]).

## 2024-02-17 DIAGNOSIS — M1712 Unilateral primary osteoarthritis, left knee: Secondary | ICD-10-CM | POA: Diagnosis not present

## 2024-02-17 DIAGNOSIS — R262 Difficulty in walking, not elsewhere classified: Secondary | ICD-10-CM | POA: Diagnosis not present

## 2024-02-17 DIAGNOSIS — M51369 Other intervertebral disc degeneration, lumbar region without mention of lumbar back pain or lower extremity pain: Secondary | ICD-10-CM | POA: Diagnosis not present

## 2024-02-17 DIAGNOSIS — R2681 Unsteadiness on feet: Secondary | ICD-10-CM | POA: Diagnosis not present

## 2024-02-20 ENCOUNTER — Other Ambulatory Visit: Payer: Self-pay

## 2024-02-20 ENCOUNTER — Encounter (HOSPITAL_COMMUNITY)
Admission: RE | Disposition: A | Discharge status: Home / Self Care | Payer: Self-pay | Source: Home / Self Care | Attending: Vascular Surgery

## 2024-02-20 ENCOUNTER — Ambulatory Visit (HOSPITAL_COMMUNITY)
Admission: RE | Admit: 2024-02-20 | Discharge: 2024-02-20 | Disposition: A | Discharge status: Home / Self Care | Attending: Vascular Surgery | Admitting: Vascular Surgery

## 2024-02-20 DIAGNOSIS — Y832 Surgical operation with anastomosis, bypass or graft as the cause of abnormal reaction of the patient, or of later complication, without mention of misadventure at the time of the procedure: Secondary | ICD-10-CM | POA: Insufficient documentation

## 2024-02-20 DIAGNOSIS — N2581 Secondary hyperparathyroidism of renal origin: Secondary | ICD-10-CM | POA: Diagnosis not present

## 2024-02-20 DIAGNOSIS — Z992 Dependence on renal dialysis: Secondary | ICD-10-CM | POA: Insufficient documentation

## 2024-02-20 DIAGNOSIS — T82858A Stenosis of vascular prosthetic devices, implants and grafts, initial encounter: Secondary | ICD-10-CM | POA: Insufficient documentation

## 2024-02-20 DIAGNOSIS — I871 Compression of vein: Secondary | ICD-10-CM | POA: Diagnosis not present

## 2024-02-20 DIAGNOSIS — N186 End stage renal disease: Secondary | ICD-10-CM | POA: Diagnosis not present

## 2024-02-20 HISTORY — PX: A/V FISTULAGRAM: CATH118298

## 2024-02-20 HISTORY — PX: VENOUS ANGIOPLASTY: CATH118376

## 2024-02-20 HISTORY — PX: VENOUS STENT: CATH118377

## 2024-02-20 HISTORY — PX: A/V SHUNT INTERVENTION: CATH118220

## 2024-02-20 SURGERY — A/V FISTULAGRAM
Anesthesia: LOCAL | Site: Arm Upper

## 2024-02-20 MED ORDER — IODIXANOL 320 MG/ML IV SOLN
INTRAVENOUS | Status: DC | PRN
  Administered 2024-02-20: 35 mL via INTRAVENOUS

## 2024-02-20 MED ORDER — CLOPIDOGREL BISULFATE 75 MG PO TABS: 75.0000 mg | ORAL_TABLET | Freq: Every day | ORAL | 2 refills | Status: DC

## 2024-02-20 MED ORDER — LIDOCAINE HCL (PF) 1 % IJ SOLN
INTRAMUSCULAR | Status: DC | PRN
  Administered 2024-02-20: 2 mL via SUBCUTANEOUS

## 2024-02-20 MED ORDER — HEPARIN (PORCINE) IN NACL 1000-0.9 UT/500ML-% IV SOLN
INTRAVENOUS | Status: DC | PRN
  Administered 2024-02-20: 500 mL

## 2024-02-20 SURGICAL SUPPLY — 16 items
BALLOON FOGARTY 5FR 40 (CATHETERS) IMPLANT
BALLOON MUSTANG 8X100X75 (BALLOONS) IMPLANT
CATH MACH 1 ST 7FR 55 (CATHETERS) IMPLANT
GUIDEWIRE ANGLED .035X150CM (WIRE) IMPLANT
KIT ENCORE 26 ADVANTAGE (KITS) IMPLANT
KIT MICROPUNCTURE NIT STIFF (SHEATH) IMPLANT
KIT PV (KITS) ×3 IMPLANT
SHEATH PINNACLE 8F 10CM (SHEATH) IMPLANT
SHEATH PINNACLE R/O II 6F 4CM (SHEATH) IMPLANT
SHEATH PINNACLE R/O II 7F 4CM (SHEATH) IMPLANT
SHEATH PROBE COVER 6X72 (BAG) IMPLANT
STENT VIABAHN 120X8X7.5 (Permanent Stent) IMPLANT
STOPCOCK MORSE 400PSI 3WAY (MISCELLANEOUS) IMPLANT
TRAY PV CATH (CUSTOM PROCEDURE TRAY) ×3 IMPLANT
TUBING CIL FLEX 10 FLL-RA (TUBING) IMPLANT
WIRE BENTSON .035X145CM (WIRE) IMPLANT

## 2024-02-20 NOTE — H&P (Signed)
 [facility] [place] PROGRESS NOTE  ASSESSMENT / PLAN: Brian Barrera is a 53 y.o. male with end-stage renal disease.  He presents today for evaluation of left arm AV fistula malfunction.  Plan fistulogram to evaluate manage.  SUBJECTIVE: Patient presents to dialysis access center for evaluation and management of left arm AV fistula malfunction.  Patient has no other complaints today.  OBJECTIVE: BP (!) 118/98   Pulse 66   Resp 12   SpO2 98%   No acute distress Regular rate and rhythm Unlabored breathing Pulsatile AV fistula     Latest Ref Rng & Units 09/15/2023    8:47 AM 06/28/2023    1:32 PM 06/28/2023    1:27 PM  CBC  Hemoglobin 13.0 - 17.0 g/dL 82.9  82.2  82.2   Hematocrit 39.0 - 52.0 % 50.0  52.0  52.0         Latest Ref Rng & Units 09/15/2023    8:47 AM 06/28/2023    1:37 PM 06/28/2023    1:32 PM  CMP  Glucose 70 - 99 mg/dL 95  84  85   BUN 6 - 20 mg/dL 51  52  50   Creatinine 0.61 - 1.24 mg/dL 87.59  87.11  85.59   Sodium 135 - 145 mmol/L 137  137  135   Potassium 3.5 - 5.1 mmol/L 5.0  6.1  6.0   Chloride 98 - 111 mmol/L 105  98  104   CO2 22 - 32 mmol/L  20    Calcium 8.9 - 10.3 mg/dL  9.0     30 minutes spent evaluating this patient including documentation, prior record review, patient history and physical exam.  Debby SAILOR. Magda, MD [facility] Phone Number: [phone] 02/27/2024 11:55 AM

## 2024-02-20 NOTE — Op Note (Signed)
 DATE OF SERVICE: 02/20/2024  PATIENT:  Brian Barrera  53 y.o. male  PRE-OPERATIVE DIAGNOSIS:  end-stage renal disease; malfunction left arm AVF  POST-OPERATIVE DIAGNOSIS:  Same  PROCEDURE:   1) Ultrasound guided left arm AVF access (CPT [phone]) 2) Fistulagram with peripheral angioplasty and stenting 8x71mm Viabahn (CPT [phone])  3) established outpatient evaluation and management - level 3 (CPT 99213)  SURGEON:  Debby SAILOR. Magda, MD  ASSISTANT: none  ANESTHESIA:   local  ESTIMATED BLOOD LOSS: min  LOCAL MEDICATIONS USED:  LIDOCAINE    COUNTS: confirmed correct.  PATIENT DISPOSITION:  PACU - hemodynamically stable.   Delay start of Pharmacological VTE agent (>24hrs) due to surgical blood loss or risk of bleeding: no  INDICATION FOR PROCEDURE: Brian Barrera is a 53 y.o. male with ESRD and malfunction of left arm AVF. After careful discussion of risks, benefits, and alternatives the patient was offered fistulagram. The patient understood and wished to proceed.  OPERATIVE FINDINGS:  Left Upper Extremity Central venous: no stenosis Subclavian vein: no stenosis Axillary vein: stenosis in outflow of fistula - 80%. 60% residual after angioplasty.  Fistula: Outflow of fistula with 80% stenosis as above. Irregular appearance consistent with chronic cannulation Anastomosis: no stenosis.  DESCRIPTION OF PROCEDURE: After identification of the patient in the pre-operative holding area, the patient was transferred to the operating room. The patient was positioned supine on the operating room table.  The left upper extremity was prepped and draped in standard fashion. A surgical pause was performed confirming correct patient, procedure, and operative location.  The left upper extremity was anesthetized with subcutaneous injection of 1% lidocaine  over the area of planned access. Using ultrasound guidance, the left arm dialysis access was accessed with micropuncture technique.  Fistulogram  was performed in stations with the micro sheath.  See above for details.  The decision was made to intervene. The lesion was crossed with a Bentson wire.  Access was upsized to 8 [place].  Angioplasty was performed of lesion with an 8 x 100 mm balloon.  Significant residual stenosis was noted.  An 8 x 75 mm Viabahn balloon was deployed across the lesion with good result.  All endovascular equipment was removed.  A figure-of-eight stitch was applied to the exit site with good hemostasis.  Sterile bandage was applied.  Upon completion of the case instrument and sharps counts were confirmed correct. The patient was transferred to the PACU in good condition. I was present for all portions of the procedure.  PLAN: Okay to use fistula for dialysis.  Fistula remains amenable to percutaneous intervention.  Follow-up with me as needed.  Debby SAILOR. Magda, MD [facility] Phone Number: [phone] 02/20/2024 12:31 PM

## 2024-02-21 ENCOUNTER — Encounter (HOSPITAL_COMMUNITY): Payer: Self-pay | Admitting: Vascular Surgery

## 2024-02-21 DIAGNOSIS — N186 End stage renal disease: Secondary | ICD-10-CM | POA: Diagnosis not present

## 2024-02-21 DIAGNOSIS — E782 Mixed hyperlipidemia: Secondary | ICD-10-CM | POA: Diagnosis not present

## 2024-02-21 DIAGNOSIS — I10 Essential (primary) hypertension: Secondary | ICD-10-CM | POA: Diagnosis not present

## 2024-02-21 DIAGNOSIS — J449 Chronic obstructive pulmonary disease, unspecified: Secondary | ICD-10-CM | POA: Diagnosis not present

## 2024-02-21 DIAGNOSIS — Z992 Dependence on renal dialysis: Secondary | ICD-10-CM | POA: Diagnosis not present

## 2024-02-22 DIAGNOSIS — Z23 Encounter for immunization: Secondary | ICD-10-CM | POA: Diagnosis not present

## 2024-02-22 DIAGNOSIS — N2581 Secondary hyperparathyroidism of renal origin: Secondary | ICD-10-CM | POA: Diagnosis not present

## 2024-02-22 DIAGNOSIS — Z992 Dependence on renal dialysis: Secondary | ICD-10-CM | POA: Diagnosis not present

## 2024-02-22 DIAGNOSIS — N186 End stage renal disease: Secondary | ICD-10-CM | POA: Diagnosis not present

## 2024-02-24 DIAGNOSIS — N2581 Secondary hyperparathyroidism of renal origin: Secondary | ICD-10-CM | POA: Diagnosis not present

## 2024-02-24 DIAGNOSIS — N186 End stage renal disease: Secondary | ICD-10-CM | POA: Diagnosis not present

## 2024-02-24 DIAGNOSIS — Z992 Dependence on renal dialysis: Secondary | ICD-10-CM | POA: Diagnosis not present

## 2024-02-24 DIAGNOSIS — Z23 Encounter for immunization: Secondary | ICD-10-CM | POA: Diagnosis not present

## 2024-02-27 DIAGNOSIS — Z992 Dependence on renal dialysis: Secondary | ICD-10-CM | POA: Diagnosis not present

## 2024-02-27 DIAGNOSIS — N2581 Secondary hyperparathyroidism of renal origin: Secondary | ICD-10-CM | POA: Diagnosis not present

## 2024-02-27 DIAGNOSIS — N186 End stage renal disease: Secondary | ICD-10-CM | POA: Diagnosis not present

## 2024-02-27 DIAGNOSIS — Z23 Encounter for immunization: Secondary | ICD-10-CM | POA: Diagnosis not present

## 2024-02-29 DIAGNOSIS — Z992 Dependence on renal dialysis: Secondary | ICD-10-CM | POA: Diagnosis not present

## 2024-02-29 DIAGNOSIS — Z23 Encounter for immunization: Secondary | ICD-10-CM | POA: Diagnosis not present

## 2024-02-29 DIAGNOSIS — N186 End stage renal disease: Secondary | ICD-10-CM | POA: Diagnosis not present

## 2024-02-29 DIAGNOSIS — N2581 Secondary hyperparathyroidism of renal origin: Secondary | ICD-10-CM | POA: Diagnosis not present

## 2024-03-02 DIAGNOSIS — Z992 Dependence on renal dialysis: Secondary | ICD-10-CM | POA: Diagnosis not present

## 2024-03-02 DIAGNOSIS — N2581 Secondary hyperparathyroidism of renal origin: Secondary | ICD-10-CM | POA: Diagnosis not present

## 2024-03-02 DIAGNOSIS — N186 End stage renal disease: Secondary | ICD-10-CM | POA: Diagnosis not present

## 2024-03-02 DIAGNOSIS — Z23 Encounter for immunization: Secondary | ICD-10-CM | POA: Diagnosis not present

## 2024-03-05 DIAGNOSIS — Z992 Dependence on renal dialysis: Secondary | ICD-10-CM | POA: Diagnosis not present

## 2024-03-05 DIAGNOSIS — N2581 Secondary hyperparathyroidism of renal origin: Secondary | ICD-10-CM | POA: Diagnosis not present

## 2024-03-05 DIAGNOSIS — N186 End stage renal disease: Secondary | ICD-10-CM | POA: Diagnosis not present

## 2024-03-05 DIAGNOSIS — Z23 Encounter for immunization: Secondary | ICD-10-CM | POA: Diagnosis not present

## 2024-03-07 DIAGNOSIS — N186 End stage renal disease: Secondary | ICD-10-CM | POA: Diagnosis not present

## 2024-03-07 DIAGNOSIS — Z23 Encounter for immunization: Secondary | ICD-10-CM | POA: Diagnosis not present

## 2024-03-07 DIAGNOSIS — Z992 Dependence on renal dialysis: Secondary | ICD-10-CM | POA: Diagnosis not present

## 2024-03-07 DIAGNOSIS — N2581 Secondary hyperparathyroidism of renal origin: Secondary | ICD-10-CM | POA: Diagnosis not present

## 2024-03-09 DIAGNOSIS — N186 End stage renal disease: Secondary | ICD-10-CM | POA: Diagnosis not present

## 2024-03-09 DIAGNOSIS — Z23 Encounter for immunization: Secondary | ICD-10-CM | POA: Diagnosis not present

## 2024-03-09 DIAGNOSIS — Z992 Dependence on renal dialysis: Secondary | ICD-10-CM | POA: Diagnosis not present

## 2024-03-09 DIAGNOSIS — N2581 Secondary hyperparathyroidism of renal origin: Secondary | ICD-10-CM | POA: Diagnosis not present

## 2024-03-12 DIAGNOSIS — Z23 Encounter for immunization: Secondary | ICD-10-CM | POA: Diagnosis not present

## 2024-03-12 DIAGNOSIS — N186 End stage renal disease: Secondary | ICD-10-CM | POA: Diagnosis not present

## 2024-03-12 DIAGNOSIS — Z992 Dependence on renal dialysis: Secondary | ICD-10-CM | POA: Diagnosis not present

## 2024-03-12 DIAGNOSIS — N2581 Secondary hyperparathyroidism of renal origin: Secondary | ICD-10-CM | POA: Diagnosis not present

## 2024-03-13 DIAGNOSIS — E782 Mixed hyperlipidemia: Secondary | ICD-10-CM | POA: Diagnosis not present

## 2024-03-13 DIAGNOSIS — I5032 Chronic diastolic (congestive) heart failure: Secondary | ICD-10-CM | POA: Diagnosis not present

## 2024-03-13 DIAGNOSIS — L97919 Non-pressure chronic ulcer of unspecified part of right lower leg with unspecified severity: Secondary | ICD-10-CM | POA: Diagnosis not present

## 2024-03-13 DIAGNOSIS — N186 End stage renal disease: Secondary | ICD-10-CM | POA: Diagnosis not present

## 2024-03-13 DIAGNOSIS — M1712 Unilateral primary osteoarthritis, left knee: Secondary | ICD-10-CM | POA: Diagnosis not present

## 2024-03-13 DIAGNOSIS — J449 Chronic obstructive pulmonary disease, unspecified: Secondary | ICD-10-CM | POA: Diagnosis not present

## 2024-03-13 DIAGNOSIS — I471 Supraventricular tachycardia, unspecified: Secondary | ICD-10-CM | POA: Diagnosis not present

## 2024-03-13 DIAGNOSIS — L89899 Pressure ulcer of other site, unspecified stage: Secondary | ICD-10-CM | POA: Diagnosis not present

## 2024-03-13 DIAGNOSIS — I10 Essential (primary) hypertension: Secondary | ICD-10-CM | POA: Diagnosis not present

## 2024-03-13 DIAGNOSIS — M51369 Other intervertebral disc degeneration, lumbar region without mention of lumbar back pain or lower extremity pain: Secondary | ICD-10-CM | POA: Diagnosis not present

## 2024-03-14 DIAGNOSIS — Z992 Dependence on renal dialysis: Secondary | ICD-10-CM | POA: Diagnosis not present

## 2024-03-14 DIAGNOSIS — N186 End stage renal disease: Secondary | ICD-10-CM | POA: Diagnosis not present

## 2024-03-14 DIAGNOSIS — Z23 Encounter for immunization: Secondary | ICD-10-CM | POA: Diagnosis not present

## 2024-03-14 DIAGNOSIS — N2581 Secondary hyperparathyroidism of renal origin: Secondary | ICD-10-CM | POA: Diagnosis not present

## 2024-03-16 DIAGNOSIS — N186 End stage renal disease: Secondary | ICD-10-CM | POA: Diagnosis not present

## 2024-03-16 DIAGNOSIS — Z992 Dependence on renal dialysis: Secondary | ICD-10-CM | POA: Diagnosis not present

## 2024-03-16 DIAGNOSIS — N2581 Secondary hyperparathyroidism of renal origin: Secondary | ICD-10-CM | POA: Diagnosis not present

## 2024-03-16 DIAGNOSIS — Z23 Encounter for immunization: Secondary | ICD-10-CM | POA: Diagnosis not present

## 2024-03-18 DIAGNOSIS — N186 End stage renal disease: Secondary | ICD-10-CM | POA: Diagnosis not present

## 2024-03-18 DIAGNOSIS — Z992 Dependence on renal dialysis: Secondary | ICD-10-CM | POA: Diagnosis not present

## 2024-03-18 DIAGNOSIS — N2581 Secondary hyperparathyroidism of renal origin: Secondary | ICD-10-CM | POA: Diagnosis not present

## 2024-03-18 DIAGNOSIS — Z23 Encounter for immunization: Secondary | ICD-10-CM | POA: Diagnosis not present

## 2024-03-19 DIAGNOSIS — Z992 Dependence on renal dialysis: Secondary | ICD-10-CM | POA: Diagnosis not present

## 2024-03-19 DIAGNOSIS — N186 End stage renal disease: Secondary | ICD-10-CM | POA: Diagnosis not present

## 2024-03-19 DIAGNOSIS — N2581 Secondary hyperparathyroidism of renal origin: Secondary | ICD-10-CM | POA: Diagnosis not present

## 2024-03-19 DIAGNOSIS — Z23 Encounter for immunization: Secondary | ICD-10-CM | POA: Diagnosis not present

## 2024-03-21 DIAGNOSIS — Z23 Encounter for immunization: Secondary | ICD-10-CM | POA: Diagnosis not present

## 2024-03-21 DIAGNOSIS — N2581 Secondary hyperparathyroidism of renal origin: Secondary | ICD-10-CM | POA: Diagnosis not present

## 2024-03-21 DIAGNOSIS — N186 End stage renal disease: Secondary | ICD-10-CM | POA: Diagnosis not present

## 2024-03-21 DIAGNOSIS — Z992 Dependence on renal dialysis: Secondary | ICD-10-CM | POA: Diagnosis not present

## 2024-03-23 DIAGNOSIS — N186 End stage renal disease: Secondary | ICD-10-CM | POA: Diagnosis not present

## 2024-03-23 DIAGNOSIS — Z23 Encounter for immunization: Secondary | ICD-10-CM | POA: Diagnosis not present

## 2024-03-23 DIAGNOSIS — Z992 Dependence on renal dialysis: Secondary | ICD-10-CM | POA: Diagnosis not present

## 2024-03-23 DIAGNOSIS — N2581 Secondary hyperparathyroidism of renal origin: Secondary | ICD-10-CM | POA: Diagnosis not present

## 2024-03-26 ENCOUNTER — Encounter (HOSPITAL_COMMUNITY)
Admission: RE | Disposition: A | Discharge status: Home / Self Care | Payer: Self-pay | Source: Home / Self Care | Attending: Vascular Surgery

## 2024-03-26 ENCOUNTER — Ambulatory Visit (HOSPITAL_COMMUNITY)
Admission: RE | Admit: 2024-03-26 | Discharge: 2024-03-26 | Disposition: A | Discharge status: Home / Self Care | Attending: Vascular Surgery | Admitting: Vascular Surgery

## 2024-03-26 ENCOUNTER — Other Ambulatory Visit: Payer: Self-pay

## 2024-03-26 DIAGNOSIS — Z992 Dependence on renal dialysis: Secondary | ICD-10-CM | POA: Insufficient documentation

## 2024-03-26 DIAGNOSIS — T82898A Other specified complication of vascular prosthetic devices, implants and grafts, initial encounter: Secondary | ICD-10-CM

## 2024-03-26 DIAGNOSIS — Y832 Surgical operation with anastomosis, bypass or graft as the cause of abnormal reaction of the patient, or of later complication, without mention of misadventure at the time of the procedure: Secondary | ICD-10-CM | POA: Insufficient documentation

## 2024-03-26 DIAGNOSIS — I132 Hypertensive heart and chronic kidney disease with heart failure and with stage 5 chronic kidney disease, or end stage renal disease: Secondary | ICD-10-CM | POA: Insufficient documentation

## 2024-03-26 DIAGNOSIS — I5032 Chronic diastolic (congestive) heart failure: Secondary | ICD-10-CM | POA: Diagnosis not present

## 2024-03-26 DIAGNOSIS — Z95828 Presence of other vascular implants and grafts: Secondary | ICD-10-CM | POA: Diagnosis not present

## 2024-03-26 DIAGNOSIS — F17210 Nicotine dependence, cigarettes, uncomplicated: Secondary | ICD-10-CM | POA: Diagnosis not present

## 2024-03-26 DIAGNOSIS — N186 End stage renal disease: Secondary | ICD-10-CM | POA: Insufficient documentation

## 2024-03-26 DIAGNOSIS — T82510A Breakdown (mechanical) of surgically created arteriovenous fistula, initial encounter: Secondary | ICD-10-CM | POA: Diagnosis not present

## 2024-03-26 HISTORY — PX: A/V FISTULAGRAM: CATH118298

## 2024-03-26 SURGERY — A/V FISTULAGRAM
Anesthesia: LOCAL | Site: Arm Upper | Laterality: Left

## 2024-03-26 MED ORDER — LIDOCAINE HCL (PF) 1 % IJ SOLN
INTRAMUSCULAR | Status: DC | PRN
  Administered 2024-03-26: 5 mL via INTRADERMAL

## 2024-03-26 MED ORDER — LIDOCAINE HCL (PF) 1 % IJ SOLN
INTRAMUSCULAR | Status: AC
  Filled 2024-03-26: qty 30

## 2024-03-26 MED ORDER — IODIXANOL 320 MG/ML IV SOLN
INTRAVENOUS | Status: DC | PRN
  Administered 2024-03-26: 20 mL

## 2024-03-26 MED ORDER — HEPARIN (PORCINE) IN NACL 1000-0.9 UT/500ML-% IV SOLN
INTRAVENOUS | Status: DC | PRN
  Administered 2024-03-26: 500 mL

## 2024-03-26 SURGICAL SUPPLY — 5 items
KIT MICROPUNCTURE NIT STIFF (SHEATH) IMPLANT
KIT PV (KITS) ×1 IMPLANT
SHEATH PROBE COVER 6X72 (BAG) IMPLANT
TRAY PV CATH (CUSTOM PROCEDURE TRAY) ×1 IMPLANT
TUBING CIL FLEX 10 FLL-RA (TUBING) IMPLANT

## 2024-03-26 NOTE — Op Note (Signed)
 DATE OF SERVICE: 03/26/2024  PATIENT:  Brian Barrera  53 y.o. male  PRE-OPERATIVE DIAGNOSIS:  end-stage renal disease  POST-OPERATIVE DIAGNOSIS:  Same  PROCEDURE:   1) Ultrasound guided left arm AVF access (CPT [phone]) 2) Left arm AV fistulagram (CPT [phone]) 3) established outpatient evaluation and management - level 3 (CPT 99213)  SURGEON:  Debby SAILOR. Magda, MD  ASSISTANT: none  ANESTHESIA:   local  ESTIMATED BLOOD LOSS: min  LOCAL MEDICATIONS USED:  LIDOCAINE    COUNTS: confirmed correct.  PATIENT DISPOSITION:  PACU - hemodynamically stable.   Delay start of Pharmacological VTE agent (>24hrs) due to surgical blood loss or risk of bleeding: no  INDICATION FOR PROCEDURE: Brian Barrera is a 53 y.o. male with left shoulder pain and numbness in the left forearm and hand. After careful discussion of risks, benefits, and alternatives the patient was offered fistulagram to evaluate recent intervention. The patient understood and wished to proceed.  OPERATIVE FINDINGS:  Left Upper Extremity Central venous: no stenosis Subclavian vein: no stenosis Axillary vein: no stenosis Fistula: prior stenting widely patent. Pseudoaneurysmal change near anastomosis Anastomosis: no stenosis  DESCRIPTION OF PROCEDURE: After identification of the patient in the pre-operative holding area, the patient was transferred to the operating room. The patient was positioned supine on the operating room table.  The left upper extremity was prepped and draped in standard fashion. A surgical pause was performed confirming correct patient, procedure, and operative location.  The left upper extremity was anesthetized with subcutaneous injection of 1% lidocaine  over the area of planned access. Using ultrasound guidance, the left upper extremity dialysis access was accessed with micropuncture technique.  Fistulogram was performed in stations with the micro sheath.  See above for details.  All endovascular  equipment was removed.  A figure-of-eight stitch was applied to the exit site with good hemostasis.  Sterile bandage was applied.  Upon completion of the case instrument and sharps counts were confirmed correct. The patient was transferred to the  PACU in good condition. I was present for all portions of the procedure.  PLAN: OK to use fistula. Will refer to orthopedics to evaluate shoulder pain.  Debby SAILOR. Magda, MD [facility] Phone Number: [phone] 03/26/2024 11:47 AM

## 2024-03-26 NOTE — H&P (Signed)
 [facility] [place]  ASSESSMENT / PLAN: 53 y.o. male with left shoulder pain and forearm numbness in setting of end-stage renal disease with ipsilateral AV fistula.  Plan fistulogram to evaluate recent intervention.  Counseled that he likely will need orthopedic opinion.  CHIEF COMPLAINT: Shoulder pain and arm numbness  HISTORY OF PRESENT ILLNESS: Brian Barrera is a 53 y.o. male with end-stage renal disease on dialysis through a left arm AV fistula.  The fistula is working well for him at dialysis.  He ports lateral deltoid pain and numbness in the medial forearm extending into the 4th and 5th digit.  The fistula has a strong thrill.  I counseled him that fistulogram today would rule out fistula related explanation for his discomfort.  I counseled him that he would likely need to see an orthopedic surgeon if fistulogram is normal   Past Medical History:  Diagnosis Date   Anemia    Attributed to chronic disease/chronic kidney disease   Asthma    CHF (congestive heart failure) (HCC)    Chronic diastolic heart failure (HCC) 05/24/2009   EF 35% by echo in 2011; echo in 08/2011-normal EF, moderate to severe LVH; BNP level of [phone] in 2011-12   Chronic obstructive pulmonary disease (HCC)    with asthmatic component   Dysrhythmia    ESRD (end stage renal disease) on dialysis (HCC)    secondary hyperparathyroidism   Hypertension    h/o hypertensive crisis with encephalopathy   Obesity    Tobacco abuse    Approximate consumption of 25 pack years; continuing at 0.5 pack per day    Past Surgical History:  Procedure Laterality Date   A/V FISTULAGRAM Left 09/15/2023   Procedure: A/V Fistulagram;  Surgeon: Gretta Lonni PARAS, MD;  Location: [facility] INVASIVE CV LAB;  Service: Cardiovascular;  Laterality: Left;   A/V FISTULAGRAM Left 02/20/2024   Procedure: A/V Fistulagram;  Surgeon: Magda Debby SAILOR, MD;  Location: [facility] PV LAB;  Service: Cardiovascular;  Laterality:  Left;   A/V SHUNT INTERVENTION N/A 10/13/2023   Procedure: A/V SHUNT INTERVENTION;  Surgeon: Lanis Fonda FORBES, MD;  Location: [facility] LAB;  Service: Cardiovascular;  Laterality: N/A;   A/V SHUNT INTERVENTION N/A 02/20/2024   Procedure: A/V SHUNT INTERVENTION;  Surgeon: Magda Debby SAILOR, MD;  Location: [facility];  Service: Cardiovascular;  Laterality: N/A;   ARTERIOVENOUS GRAFT PLACEMENT  05/24/2009   AV FISTULA PLACEMENT Left 05/03/2023   Procedure: LEFT ARM BASILIC VEIN FISTULA CREATION;  Surgeon: Sheree Penne Lonni, MD;  Location: [facility] OR;  Service: Vascular;  Laterality: Left;   BASCILIC VEIN TRANSPOSITION Left 06/28/2023   Procedure: LEFT ARM BASILIC VEIN TRANSPOSITION;  Surgeon: Sheree Penne Lonni, MD;  Location: [facility] OR;  Service: Vascular;  Laterality: Left;   COLONOSCOPY N/A 02/04/2014   Procedure: COLONOSCOPY;  Surgeon: Lamar CHRISTELLA Hollingshead, MD;  Location: [facility];  Service: Endoscopy;  Laterality: N/A;  12:00   COLONOSCOPY N/A 07/03/2019   Procedure: COLONOSCOPY;  Surgeon: Hollingshead Lamar CHRISTELLA, MD;  Location: [facility];  Service: Endoscopy;  Laterality: N/A;  12:00-office rescheduled 2/9 @ 11:00am   INSERTION OF DIALYSIS CATHETER Right 03/01/2023   Procedure: INSERTION OF RIGHT INTERNAL JUGULAR TUNNELED DIALYSIS CATHETER;  Surgeon: Sheree Penne Lonni, MD;  Location: [facility] OR;  Service: Vascular;  Laterality: Right;   IR FLUORO GUIDE CV LINE RIGHT  04/04/2023   IR FLUORO GUIDE CV LINE RIGHT  07/08/2023   IR FLUORO GUIDE CV LINE RIGHT  08/30/2023  IR US  GUIDE VASC ACCESS RIGHT  07/08/2023   POLYPECTOMY  07/03/2019   Procedure: POLYPECTOMY;  Surgeon: Shaaron Lamar HERO, MD;  [facility];  Service: Endoscopy;;   REVISON OF ARTERIOVENOUS FISTULA Right 01/25/2023   Procedure: REVISION OF RIGHT ARM ARTERIOVENOUS FISTULA;  Surgeon: Sheree Penne Bruckner, MD;  Location: [facility] OR;  Service: Vascular;  Laterality: Right;   REVISON OF ARTERIOVENOUS FISTULA Right 03/01/2023    Procedure: INSERTION OF ARTERIOVENOUS GRAFT USING ARTEGRAFT  X 42CM;  Surgeon: Sheree Penne Bruckner, MD;  Location: [facility] OR;  Service: Vascular;  Laterality: Right;   REVISON OF ARTERIOVENOUS FISTULA Right 06/28/2023   Procedure: EXCISION OF RIGHT ARTERIOVENOUS FISTULA PSEUDOANEURYSM;  Surgeon: Sheree Penne Bruckner, MD;  Location: [facility] OR;  Service: Vascular;  Laterality: Right;   THROMBECTOMY W/ EMBOLECTOMY  01/25/2023   Procedure: THROMBECTOMY RIGHT ARTERIOVENOUS FISTULA WITH BALLOON ANGIOPLASTY;  Surgeon: Sheree Penne Bruckner, MD;  Location: [facility] OR;  Service: Vascular;;   UPPER EXTREMITY INTERVENTION  09/15/2023   Procedure: UPPER EXTREMITY INTERVENTION;  Surgeon: Gretta Bruckner PARAS, MD;  Location: [facility] INVASIVE CV LAB;  Service: Cardiovascular;;   UPPER EXTREMITY VENOGRAPHY Bilateral 04/07/2023   Procedure: UPPER EXTREMITY VENOGRAPHY;  Surgeon: Gretta Bruckner PARAS, MD;  Location: [facility] INVASIVE CV LAB;  Service: Cardiovascular;  Laterality: Bilateral;   VENOGRAM Right 01/25/2023   Procedure: RIGHT ARM FISTULOGRAM;  Surgeon: Sheree Penne Bruckner, MD;  Location: [facility] OR;  Service: Vascular;  Laterality: Right;   VENOUS ANGIOPLASTY  10/13/2023   Procedure: VENOUS ANGIOPLASTY;  Surgeon: Lanis Fonda BRAVO, MD;  Location: [facility];  Service: Cardiovascular;;   VENOUS ANGIOPLASTY Left 02/20/2024   Procedure: VENOUS ANGIOPLASTY;  Surgeon: Magda Debby SAILOR, MD;  Location: [facility];  Service: Cardiovascular;  Laterality: Left;  Outflow Basilic Vein   VENOUS STENT Left 02/20/2024   Procedure: VENOUS STENT;  Surgeon: Magda Debby SAILOR, MD;  Location: [facility];  Service: Cardiovascular;  Laterality: Left;  8x7.5 Viabahn    Family History  Problem Relation Age of Onset   Heart disease Father    Diabetes Sister    Hypertension Sister    Diabetes Sister    Hypertension Sister    Kidney disease Mother    Hypertension Mother    Hypertension Sister    Hypertension Brother     Hypertension Brother    Hypertension Brother    Colon cancer Neg Hx     Social History   Socioeconomic History   Marital status: Single    Spouse name: Not on file   Number of children: 3   Years of education: Not on file   Highest education level: Not on file  Occupational History   Occupation: Disability  Tobacco Use   Smoking status: Every Day    Current packs/day: 0.50    Average packs/day: 0.5 packs/day for 38.5 years (19.3 ttl pk-yrs)    Types: Cigarettes    Start date: 09/23/1985   Smokeless tobacco: Never   Tobacco comments:    8-10 ciggs per day   Vaping Use   Vaping status: Never Used  Substance and Sexual Activity   Alcohol  use: Not Currently    Comment: quit etoh about 2011. used to drink about 8-10 beers a week   Drug use: No   Sexual activity: Not on file  Other Topics Concern   Not on file  Social History Narrative   Not on file   Social Drivers of Health   Financial Resource Strain: Not  on file  Food Insecurity: Not on file  Transportation Needs: Not on file  Physical Activity: Not on file  Stress: Not on file  Social Connections: Not on file  Intimate Partner Violence: Not on file    Not on File  No current facility-administered medications for this encounter.    PHYSICAL EXAM Vitals:   03/26/24 1057 03/26/24 1104  BP: (!) 119/91 (!) 119/91  Pulse: 70 72  Resp: 12 14  Temp: (!) 97.5 F (36.4 C)   TempSrc: Oral   SpO2: 96% 99%   No acute distress Regular rate and rhythm Unlabored breathing Left arm AV fistula with smooth thrill  PERTINENT LABORATORY AND RADIOLOGIC DATA  Most recent CBC    Latest Ref Rng & Units 09/15/2023    8:47 AM 06/28/2023    1:32 PM 06/28/2023    1:27 PM  CBC  Hemoglobin 13.0 - 17.0 g/dL 82.9  82.2  82.2   Hematocrit 39.0 - 52.0 % 50.0  52.0  52.0      Most recent CMP    Latest Ref Rng & Units 09/15/2023    8:47 AM 06/28/2023    1:37 PM 06/28/2023    1:32 PM  CMP  Glucose 70 - 99 mg/dL 95  84  85   BUN  6 - 20 mg/dL 51  52  50   Creatinine 0.61 - 1.24 mg/dL 87.59  87.11  85.59   Sodium 135 - 145 mmol/L 137  137  135   Potassium 3.5 - 5.1 mmol/L 5.0  6.1  6.0   Chloride 98 - 111 mmol/L 105  98  104   CO2 22 - 32 mmol/L  20    Calcium 8.9 - 10.3 mg/dL  9.0      Renal function CrCl cannot be calculated (Patient's most recent lab result is older than the maximum 21 days allowed.).  No results found for: HGBA1C  LDL Cholesterol  Date Value Ref Range Status  01/04/2013 96 0 - 99 mg/dL Final    Comment:      Total Cholesterol/HDL Ratio:CHD Risk                        Coronary Heart Disease Risk Table                                        Men       Women          1/2 Average Risk              3.4        3.3              Average Risk              5.0        4.4           2X Average Risk              9.6        7.1           3X Average Risk             23.4       11.0 Use the calculated Patient Ratio above and the CHD Risk table  to determine the patient's CHD Risk. ATP III Classification (LDL):       <  100        mg/dL         Optimal      899 - 129     mg/dL         Near or Above Optimal      130 - 159     mg/dL         Borderline High      160 - 189     mg/dL         High       > 809        mg/dL         Very High      Terrace Fontanilla N. Magda, MD FACS [facility] Phone Number: [phone] 03/26/2024 11:28 AM   Total time spent on preparing this encounter including chart review, data review, collecting history, examining the patient, and coordinating care: 30 minutes.   Portions of this report may have been transcribed using voice recognition software.  Every effort has been made to ensure accuracy; however, inadvertent computerized transcription errors may still be present.

## 2024-03-27 ENCOUNTER — Encounter (HOSPITAL_COMMUNITY): Payer: Self-pay | Admitting: Vascular Surgery

## 2024-04-05 ENCOUNTER — Ambulatory Visit (INDEPENDENT_AMBULATORY_CARE_PROVIDER_SITE_OTHER): Admitting: Orthopedic Surgery

## 2024-04-05 ENCOUNTER — Encounter: Payer: Self-pay | Admitting: Orthopedic Surgery

## 2024-04-05 VITALS — BP 126/106 | Ht 72.0 in | Wt 259.0 lb

## 2024-04-05 DIAGNOSIS — M1712 Unilateral primary osteoarthritis, left knee: Secondary | ICD-10-CM

## 2024-04-05 DIAGNOSIS — I120 Hypertensive chronic kidney disease with stage 5 chronic kidney disease or end stage renal disease: Secondary | ICD-10-CM | POA: Insufficient documentation

## 2024-04-05 DIAGNOSIS — M51369 Other intervertebral disc degeneration, lumbar region without mention of lumbar back pain or lower extremity pain: Secondary | ICD-10-CM | POA: Insufficient documentation

## 2024-04-05 DIAGNOSIS — I700 Atherosclerosis of aorta: Secondary | ICD-10-CM | POA: Insufficient documentation

## 2024-04-05 DIAGNOSIS — M75102 Unspecified rotator cuff tear or rupture of left shoulder, not specified as traumatic: Secondary | ICD-10-CM

## 2024-04-05 MED ORDER — METHYLPREDNISOLONE ACETATE 40 MG/ML IJ SUSP
40.0000 mg | Freq: Once | INTRAMUSCULAR | Status: AC
  Administered 2024-04-05: 40 mg via INTRA_ARTICULAR

## 2024-04-05 NOTE — Progress Notes (Signed)
   Chief Complaint  Patient presents with   Shoulder Pain   Arm Pain    LEFT / since July when he had new dialysis shunt put in left arm has pain left arm and fingers go numb during dialysis denies neck pain but states shoulder is painful     This is a 53 year old male on dialysis.  He had a shunt placed in his left upper arm near his biceps and since that time whenever he undergoes dialysis the last 20 to 30 minutes his fingers will go numb  Initially it included his forearm but now it only includes the long ring and small finger  In the office right now there is no numbness  He says it only happens at the end of dialysis  There is really nothing I can do about this it seems to be related to the dialysis and where the catheter is related it may involve the musculocutaneous nerve or branches of it  There is no way to remove the shunt   However in the course of our evaluation he complained of chronic left shoulder pain and chronic left knee pain for which he has received injections in the past  After evaluating his shoulder and reviewing his medical condition it would be best that we not consider operative intervention at this time  I think injections should help   Encounter Diagnoses  Name Primary?   Primary osteoarthritis of left knee Yes   Rotator cuff syndrome of left shoulder      Procedure note the subacromial injection shoulder left   Verbal consent was obtained to inject the  Left   Shoulder  Timeout was completed to confirm the injection site is a subacromial space of the  left  shoulder  Medication used Depo-Medrol  40 mg and lidocaine  1% 3 cc  Anesthesia was provided by ethyl chloride  The injection was performed in the left  posterior subacromial space. After pinning the skin with alcohol  and anesthetized the skin with ethyl chloride the subacromial space was injected using a 20-gauge needle. There were no complications  Sterile dressing was  applied.     Encounter Diagnoses  Name Primary?   Primary osteoarthritis of left knee Yes   Rotator cuff syndrome of left shoulder       Procedure note left knee injection   verbal consent was obtained to inject left knee joint  Timeout was completed to confirm the site of injection  The medications used were depomedrol 40 mg and 1% lidocaine  3 cc Anesthesia was provided by ethyl chloride and the skin was prepped with alcohol .  After cleaning the skin with alcohol  a 20-gauge needle was used to inject the left knee joint. There were no complications. A sterile bandage was applied.

## 2024-04-05 NOTE — Progress Notes (Signed)
  Intake history:  Chief Complaint  Patient presents with   Shoulder Pain   Arm Pain    LEFT / since July when he had new dialysis shunt put in left arm has pain left arm and fingers go numb during dialysis denies neck pain but states shoulder is painful      Ht 6' (1.829 m)   Wt 259 lb (117.5 kg)   BMI 35.13 kg/m  Body mass index is 35.13 kg/m.  Pharmacy? ______WG scales________________________________  WHAT ARE WE SEEING YOU FOR TODAY?   Left arm   How long has this bothered you? (DOI?DOS?WS?)  Since July   Was there an injury? No  Anticoag.  Yes   Any ALLERGIES ____NKDA __________________________________________   Treatment:  Have you taken:  Tylenol  Yes  Advil No  Had PT No  Had injection No  Other  _________________________

## 2024-05-06 NOTE — Progress Notes (Deleted)
° °  Cardiology Office Note    Date:  05/06/2024  ID:  Brian Barrera, DOB 05-14-1971, MRN [id number] Cardiologist: Alvan Carrier, MD { :  History of Present Illness:    Brian Barrera is a 53 y.o. male  with past medical history of HFimpEF (EF 35% in 2011, normalized to 60-65% by imaging in 12/2013 and at 50-55% in 12/2021), HTN, HLD, COPD and ESRD who presents to the office today for overdue follow-up.  He was last examined by Dr. Alvan in 01/2023 and reported 1 brief episode of palpitations which had resolved with an extra dose of metoprolol .  It was recommended that if he had more frequent recurrences, should follow-up with EP to consider ablation.  He was continued on ASA 81 mg daily, atorvastatin 10 mg daily, Lopressor  37.5 mg twice daily and midodrine which she took prior to dialysis.  He did follow-up with Dr. Waddell in 12/2023 and denied any recent palpitations.  The patient preferred to continue medical therapy at that time and reserve ablation for symptoms became more frequent.  ROS: ***  Studies Reviewed:   EKG: EKG is*** ordered today and demonstrates ***   EKG Interpretation Date/Time:    Ventricular Rate:    PR Interval:    QRS Duration:    QT Interval:    QTC Calculation:   R Axis:      Text Interpretation:         Echocardiogram: 12/2021 IMPRESSIONS     1. Mild global hypokinesis worse in inferior base . Left ventricular  ejection fraction, by estimation, is 50 to 55%. The left ventricle has low  normal function. The left ventricle demonstrates global hypokinesis. The  left ventricular internal cavity  size was mildly dilated. There is mild left ventricular hypertrophy. Left  ventricular diastolic parameters were normal.   2. Right ventricular systolic function is normal. The right ventricular  size is normal.   3. Left atrial size was moderately dilated.   4. The mitral valve is normal in structure. Trivial mitral valve  regurgitation. No  evidence of mitral stenosis.   5. The aortic valve is tricuspid. Aortic valve regurgitation is not  visualized. No aortic stenosis is present.   6. The inferior vena cava is normal in size with greater than 50%  respiratory variability, suggesting right atrial pressure of 3 mmHg.   Risk Assessment/Calculations:   {Does this patient have ATRIAL FIBRILLATION?:[phone]} No BP recorded.  {Refresh Note OR Click here to enter BP  :1}***         Physical Exam:   VS:  There were no vitals taken for this visit.   Wt Readings from Last 3 Encounters:  04/05/24 259 lb (117.5 kg)  01/16/24 259 lb (117.5 kg)  09/15/23 270 lb (122.5 kg)     GEN: Well nourished, well developed in no acute distress NECK: No JVD; No carotid bruits CARDIAC: ***RRR, no murmurs, rubs, gallops RESPIRATORY:  Clear to auscultation without rales, wheezing or rhonchi  ABDOMEN: Appears non-distended. No obvious abdominal masses. EXTREMITIES: No clubbing or cyanosis. No edema.  Distal pedal pulses are 2+ bilaterally.   Assessment and Plan:      {Are you ordering a CV Procedure (e.g. stress test, cath, DCCV, TEE, etc)?   Press F2        :[id number]}   Signed, Laymon CHRISTELLA Qua, PA-C

## 2024-05-08 ENCOUNTER — Ambulatory Visit: Attending: Student | Admitting: Student

## 2024-05-16 ENCOUNTER — Other Ambulatory Visit: Payer: Self-pay | Admitting: Vascular Surgery

## 2024-06-01 ENCOUNTER — Ambulatory Visit: Attending: Cardiology | Admitting: Cardiology

## 2024-06-01 ENCOUNTER — Encounter: Payer: Self-pay | Admitting: Cardiology

## 2024-06-01 VITALS — BP 105/60 | HR 81 | Ht 72.0 in | Wt 260.0 lb

## 2024-06-01 DIAGNOSIS — I5020 Unspecified systolic (congestive) heart failure: Secondary | ICD-10-CM | POA: Diagnosis not present

## 2024-06-01 DIAGNOSIS — I471 Supraventricular tachycardia, unspecified: Secondary | ICD-10-CM

## 2024-06-01 DIAGNOSIS — I10 Essential (primary) hypertension: Secondary | ICD-10-CM | POA: Diagnosis not present

## 2024-06-01 NOTE — Progress Notes (Signed)
 "     Clinical Summary [name] is a 54 y.o.male seen today for follow up of the following medical problems   1.SVT - followed by EP -has been on metoprolol , consideration for ablation if symptoms were to worsen per EP note - ER visit 07/05/22 with recurrent SVT, converted with adenosine .    - denies palpitations - compliant with meds   2.ESRD - takes midorine before HD, overall bp's stable.     3. HTN - compliant with meds   4. HFimpEF - EF 35% in 2011, normalized to 60-65% by imaging in 12/2013  12/2021 echo LVEF 50-55% - no recent SOB/DOE.   5. Hyperlipidemia - labs followed by pcp - 12/2022 TC 143 TG 157 HDL 37 LDL 79    Past Medical History:  Diagnosis Date   Anemia    Attributed to chronic disease/chronic kidney disease   Asthma    CHF (congestive heart failure) (HCC)    Chronic diastolic heart failure (HCC) 05/24/2009   EF 35% by echo in 2011; echo in 08/2011-normal EF, moderate to severe LVH; BNP level of [id number] in 2011-12   Chronic obstructive pulmonary disease (HCC)    with asthmatic component   Dysrhythmia    ESRD (end stage renal disease) on dialysis (HCC)    secondary hyperparathyroidism   Hypertension    h/o hypertensive crisis with encephalopathy   Obesity    Tobacco abuse    Approximate consumption of 25 pack years; continuing at 0.5 pack per day     Allergies[1]   Current Outpatient Medications  Medication Sig Dispense Refill   acetaminophen  (TYLENOL ) 500 MG tablet Take 1,000 mg by mouth every 6 (six) hours as needed for mild pain (pain score 1-3), moderate pain (pain score 4-6) or headache.     albuterol  (ACCUNEB ) 1.25 MG/3ML nebulizer solution Take 1 ampule by nebulization every 4 (four) hours as needed for wheezing or shortness of breath.     aspirin EC 81 MG tablet Take 81 mg by mouth in the morning.     atorvastatin (LIPITOR) 10 MG tablet Take 10 mg by mouth every evening.     AURYXIA 1 GM 210 MG(Fe) tablet Take 420 mg by mouth 3  (three) times daily with meals. And 420 mg with each snack     cinacalcet  (SENSIPAR ) 60 MG tablet Take 60 mg by mouth every Monday, Wednesday, and Friday with hemodialysis.     diphenhydramine -acetaminophen  (TYLENOL  PM) 25-500 MG TABS tablet Take 1 tablet by mouth at bedtime as needed (sleep).     DULoxetine (CYMBALTA) 60 MG capsule Take 60 mg by mouth in the morning.     lidocaine -prilocaine (EMLA) cream Apply 1 Application topically daily as needed (fistula acces).     metoprolol  tartrate (LOPRESSOR ) 25 MG tablet Take 3 tablets (75 mg total) by mouth 2 (two) times daily. 180 tablet 11   Multiple Vitamins-Minerals (MULTIVITAMIN WITH MINERALS) tablet Take 1 tablet by mouth daily. Men 50+     Omega-3 1000 MG CAPS Take 2,000 mg by mouth daily.     oxyCODONE -acetaminophen  (PERCOCET) 10-325 MG tablet Take 1 tablet by mouth every 4 (four) hours as needed for pain.     pregabalin (LYRICA) 100 MG capsule Take 100 mg by mouth 2 (two) times daily.     sevelamer carbonate (RENVELA) 800 MG tablet Take 1,600-3,200 mg by mouth See admin instructions. Take 3200 mg with meals and 1600 with snack     sildenafil (REVATIO) 20 MG tablet Take  40-60 mg by mouth daily as needed (ED).     SYMBICORT  160-4.5 MCG/ACT inhaler Inhale 2 puffs into the lungs 2 (two) times daily as needed (shortness of breath).     midodrine (PROAMATINE) 5 MG tablet Take 5 mg by mouth every Monday, Wednesday, and Friday. (Patient not taking: Reported on 06/01/2024)     No current facility-administered medications for this visit.     Past Surgical History:  Procedure Laterality Date   A/V FISTULAGRAM Left 09/15/2023   Procedure: A/V Fistulagram;  Surgeon: Brian Barrera PARAS, MD;  Location: [facility] INVASIVE CV LAB;  Service: Cardiovascular;  Laterality: Left;   A/V FISTULAGRAM Left 02/20/2024   Procedure: A/V Fistulagram;  Surgeon: Brian Debby SAILOR, MD;  Location: [facility] PV LAB;  Service: Cardiovascular;  Laterality: Left;   A/V FISTULAGRAM Left  03/26/2024   Procedure: A/V Fistulagram;  Surgeon: Brian Debby SAILOR, MD;  Location: [facility];  Service: Cardiovascular;  Laterality: Left;   A/V SHUNT INTERVENTION N/A 10/13/2023   Procedure: A/V SHUNT INTERVENTION;  Surgeon: Brian Fonda BRAVO, MD;  Location: [facility];  Service: Cardiovascular;  Laterality: N/A;   A/V SHUNT INTERVENTION N/A 02/20/2024   Procedure: A/V SHUNT INTERVENTION;  Surgeon: Brian Debby SAILOR, MD;  Location: [facility];  Service: Cardiovascular;  Laterality: N/A;   ARTERIOVENOUS GRAFT PLACEMENT  05/24/2009   AV FISTULA PLACEMENT Left 05/03/2023   Procedure: LEFT ARM BASILIC VEIN FISTULA CREATION;  Surgeon: Brian Penne Lonni, MD;  Location: [facility] OR;  Service: Vascular;  Laterality: Left;   BASCILIC VEIN TRANSPOSITION Left 06/28/2023   Procedure: LEFT ARM BASILIC VEIN TRANSPOSITION;  Surgeon: Brian Penne Lonni, MD;  Location: [facility] OR;  Service: Vascular;  Laterality: Left;   COLONOSCOPY N/A 02/04/2014   Procedure: COLONOSCOPY;  Surgeon: Brian CHRISTELLA Hollingshead, MD;  Location: [facility];  Service: Endoscopy;  Laterality: N/A;  12:00   COLONOSCOPY N/A 07/03/2019   Procedure: COLONOSCOPY;  Surgeon: Barrera Brian CHRISTELLA, MD;  Location: [facility];  Service: Endoscopy;  Laterality: N/A;  12:00-office rescheduled 2/9 @ 11:00am   INSERTION OF DIALYSIS CATHETER Right 03/01/2023   Procedure: INSERTION OF RIGHT INTERNAL JUGULAR TUNNELED DIALYSIS CATHETER;  Surgeon: Brian Penne Lonni, MD;  Location: [facility] OR;  Service: Vascular;  Laterality: Right;   IR FLUORO GUIDE CV LINE RIGHT  04/04/2023   IR FLUORO GUIDE CV LINE RIGHT  07/08/2023   IR FLUORO GUIDE CV LINE RIGHT  08/30/2023   IR US  GUIDE VASC ACCESS RIGHT  07/08/2023   POLYPECTOMY  07/03/2019   Procedure: POLYPECTOMY;  Surgeon: Barrera Brian CHRISTELLA, MD;  Location: [facility];  Service: Endoscopy;;   REVISON OF ARTERIOVENOUS FISTULA Right 01/25/2023   Procedure: REVISION OF RIGHT ARM ARTERIOVENOUS FISTULA;  Surgeon: Brian Penne Lonni, MD;  Location: [facility] OR;  Service: Vascular;  Laterality: Right;   REVISON OF ARTERIOVENOUS FISTULA Right 03/01/2023   Procedure: INSERTION OF ARTERIOVENOUS GRAFT USING ARTEGRAFT  X 42CM;  Surgeon: Brian Penne Lonni, MD;  Location: [facility] OR;  Service: Vascular;  Laterality: Right;   REVISON OF ARTERIOVENOUS FISTULA Right 06/28/2023   Procedure: EXCISION OF RIGHT ARTERIOVENOUS FISTULA PSEUDOANEURYSM;  Surgeon: Brian Penne Lonni, MD;  Location: [facility] OR;  Service: Vascular;  Laterality: Right;   THROMBECTOMY W/ EMBOLECTOMY  01/25/2023   Procedure: THROMBECTOMY RIGHT ARTERIOVENOUS FISTULA WITH BALLOON ANGIOPLASTY;  Surgeon: Brian Penne Lonni, MD;  Location: [facility] OR;  Service: Vascular;;   UPPER EXTREMITY INTERVENTION  09/15/2023   Procedure: UPPER EXTREMITY INTERVENTION;  Surgeon: Brian Barrera PARAS, MD;  Location: [facility] INVASIVE CV LAB;  Service: Cardiovascular;;   UPPER EXTREMITY VENOGRAPHY Bilateral 04/07/2023   Procedure: UPPER EXTREMITY VENOGRAPHY;  Surgeon: Brian Barrera PARAS, MD;  Location: [facility] INVASIVE CV LAB;  Service: Cardiovascular;  Laterality: Bilateral;   VENOGRAM Right 01/25/2023   Procedure: RIGHT ARM FISTULOGRAM;  Surgeon: Brian Penne Lonni, MD;  Location: [facility] OR;  Service: Vascular;  Laterality: Right;   VENOUS ANGIOPLASTY  10/13/2023   Procedure: VENOUS ANGIOPLASTY;  Surgeon: Brian Fonda BRAVO, MD;  Location: [facility];  Service: Cardiovascular;;   VENOUS ANGIOPLASTY Left 02/20/2024   Procedure: VENOUS ANGIOPLASTY;  Surgeon: Brian Debby SAILOR, MD;  Location: [facility];  Service: Cardiovascular;  Laterality: Left;  Outflow Basilic Vein   VENOUS STENT Left 02/20/2024   Procedure: VENOUS STENT;  Surgeon: Brian Debby SAILOR, MD;  Location: [facility];  Service: Cardiovascular;  Laterality: Left;  8x7.5 Viabahn     Allergies[2]    Family History  Problem Relation Age of Onset   Heart disease Father    Diabetes Sister    Hypertension  Sister    Diabetes Sister    Hypertension Sister    Kidney disease Mother    Hypertension Mother    Hypertension Sister    Hypertension Brother    Hypertension Brother    Hypertension Brother    Colon cancer Neg Hx      Social History Mr. Hitchens reports that he quit smoking about 2 weeks ago. His smoking use included cigarettes. He started smoking about 38 years ago. He has a 19.3 pack-year smoking history. He has never used smokeless tobacco. Mr. Taft reports that he does not currently use alcohol .     Physical Examination Today's Vitals   06/01/24 1345 06/01/24 1411  BP:  105/60  Pulse: 81   SpO2: 96%   Weight: 260 lb (117.9 kg)   Height: 6' (1.829 m)    Body mass index is 35.26 kg/m.  Gen: resting comfortably, no acute distress HEENT: no scleral icterus, pupils equal round and reactive, no palptable cervical adenopathy,  CV: RRR, no mrg, no jvd Resp: Clear to auscultation bilaterally GI: abdomen is soft, non-tender, non-distended, normal bowel sounds, no hepatosplenomegaly MSK: extremities are warm, no edema.  Skin: warm, no rash Neuro:  no focal deficits Psych: appropriate affect   Diagnostic Studies  NST: 03/2019 No diagnostic ST segment changes to indicate ischemia. Small, mild intensity, inferior defect that is partially reversible at the apex in the setting of increased radiotracer uptake on rest imaging near the inferior wall. Otherwise defect is fixed and suggestive of soft tissue attenuation. Cannot exclude a mild inferior apical ischemic territory, although with reduced specificity. This is a low risk study. Nuclear stress EF: 53%.   Event Monitor: 07/2020 14 day event monitor Rare supraventricular ectopy. Rare episodes of SVT up to 14 beats. Rare ventricular ectopy. One episode of NSVT lasting 6 beats. Symptoms reported but without sufficent data to correlate with heart rhythm at the time.     Patch Wear Time:  14 days and 0 hours  (2022-02-22T18:05:23-0500 to 2022-03-08T18:05:27-0500)   Patient had a min HR of 64 bpm, max HR of 207 bpm, and avg HR of 92 bpm. Predominant underlying rhythm was Sinus Rhythm. 1 run of Ventricular Tachycardia occurred lasting 6 beats with a max rate of 160 bpm (avg 148 bpm). 7 Supraventricular Tachycardia  runs occurred, the run with the fastest interval lasting 14 beats with a max  rate of 207 bpm (avg 165 bpm); the run with the fastest interval was also the longest. Isolated SVEs were rare (<1.0%), SVE Couplets were rare (<1.0%), and SVE Triplets were  rare (<1.0%). Isolated VEs were rare (<1.0%), VE Couplets were rare (<1.0%), and no VE Triplets were present. Ventricular Trigeminy was present. Inverted QRS complexes possibly due to inverted placement of device.   Assessment and Plan  1.PSVT - has overall done well on lopressor , rare infrequent palpitations.  -we will continue to monitor at this time. Denies significant symptoms - EKG today shows NSR   2. HTN - at goal, does need midodrine on HD days which he will continue   3. Hyperlipidemia - request labs from pcp  4.HFimpEF - no symptoms, euvolemic today.  - medical therapy limited by ESRD, low bp's on HD. Remote history of systolic dysfunction, LVEF has been normal for over 10 years   Dorn PHEBE Ross, M.D.     [1] No Known Allergies [2] No Known Allergies  "

## 2024-06-01 NOTE — Patient Instructions (Signed)
 Medication Instructions:  Your physician recommends that you continue on your current medications as directed. Please refer to the Current Medication list given to you today.  *If you need a refill on your cardiac medications before your next appointment, please call your pharmacy*  Lab Work: None If you have labs (blood work) drawn today and your tests are completely normal, you will receive your results only by: MyChart Message (if you have MyChart) OR A paper copy in the mail If you have any lab test that is abnormal or we need to change your treatment, we will call you to review the results.  Testing/Procedures: None  Follow-Up: At [facility], you and your health needs are our priority.  As part of our continuing mission to provide you with exceptional heart care, our providers are all part of one team.  This team includes your primary Cardiologist (physician) and Advanced Practice Providers or APPs (Physician Assistants and Nurse Practitioners) who all work together to provide you with the care you need, when you need it.  Your next appointment:   6 month(s)  Provider:   You may see Alvan Carrier, MD or one of the following Advanced Practice Providers on your designated Care Team:   Laymon Qua, PA-C  Sc[place]ia [place], [place] Olivia Pavy, [place]     We recommend signing up for the patient portal called MyChart.  Sign up information is provided on this After Visit Summary.  MyChart is used to connect with patients for Virtual Visits (Telemedicine).  Patients are able to view lab/test results, encounter notes, upcoming appointments, etc.  Non-urgent messages can be sent to your provider as well.   To learn more about what you can do with MyChart, go to [email or web address].   Other Instructions Thank you for choosing [facility]!

## 2024-06-06 ENCOUNTER — Ambulatory Visit: Attending: Vascular Surgery | Admitting: Vascular Surgery

## 2024-06-06 ENCOUNTER — Encounter: Payer: Self-pay | Admitting: Vascular Surgery

## 2024-06-06 VITALS — BP 119/78 | HR 89 | Temp 98.5°F | Ht 72.0 in | Wt 259.0 lb

## 2024-06-06 DIAGNOSIS — N186 End stage renal disease: Secondary | ICD-10-CM

## 2024-06-06 DIAGNOSIS — I7025 Atherosclerosis of native arteries of other extremities with ulceration: Secondary | ICD-10-CM | POA: Diagnosis not present

## 2024-06-06 NOTE — Progress Notes (Signed)
 "  Patient ID: Brian Barrera, male   DOB: [birth year], 54 y.o.   MRN: [id number]  Reason for Consult: Follow-up   Referred by Lari Elspeth FORBES, MD  Subjective:     HPI:  Brian Barrera is a 54 y.o. male well-known to our service line from a history of end-stage renal disease currently dialyzing Monday Wednesday and Friday via left arm AV fistula.  More recently he has wounds on his bilateral legs left greater than right.  He states the right is actually healing some.  He has associated swelling.  He denies any fevers or chills.  Past Medical History:  Diagnosis Date   Anemia    Attributed to chronic disease/chronic kidney disease   Asthma    CHF (congestive heart failure) (HCC)    Chronic diastolic heart failure (HCC) 05/24/2009   EF 35% by echo in 2011; echo in 08/2011-normal EF, moderate to severe LVH; BNP level of [phone] in 2011-12   Chronic obstructive pulmonary disease (HCC)    with asthmatic component   Dysrhythmia    ESRD (end stage renal disease) on dialysis (HCC)    secondary hyperparathyroidism   Hypertension    h/o hypertensive crisis with encephalopathy   Obesity    Tobacco abuse    Approximate consumption of 25 pack years; continuing at 0.5 pack per day   Family History  Problem Relation Age of Onset   Heart disease Father    Diabetes Sister    Hypertension Sister    Diabetes Sister    Hypertension Sister    Kidney disease Mother    Hypertension Mother    Hypertension Sister    Hypertension Brother    Hypertension Brother    Hypertension Brother    Colon cancer Neg Hx    Past Surgical History:  Procedure Laterality Date   A/V FISTULAGRAM Left 09/15/2023   Procedure: A/V Fistulagram;  Surgeon: Gretta Lonni PARAS, MD;  [facility];  Service: Cardiovascular;  Laterality: Left;   A/V FISTULAGRAM Left 02/20/2024   Procedure: A/V Fistulagram;  Surgeon: Magda Debby SAILOR, MD;  Location: [facility];  Service: Cardiovascular;  Laterality:  Left;   A/V FISTULAGRAM Left 03/26/2024   Procedure: A/V Fistulagram;  Surgeon: Magda Debby SAILOR, MD;  Location: [facility];  Service: Cardiovascular;  Laterality: Left;   A/V SHUNT INTERVENTION N/A 10/13/2023   Procedure: A/V SHUNT INTERVENTION;  Surgeon: Lanis Fonda FORBES, MD;  Location: [facility];  Service: Cardiovascular;  Laterality: N/A;   A/V SHUNT INTERVENTION N/A 02/20/2024   Procedure: A/V SHUNT INTERVENTION;  Surgeon: Magda Debby SAILOR, MD;  Location: [facility];  Service: Cardiovascular;  Laterality: N/A;   ARTERIOVENOUS GRAFT PLACEMENT  05/24/2009   AV FISTULA PLACEMENT Left 05/03/2023   Procedure: LEFT ARM BASILIC VEIN FISTULA CREATION;  Surgeon: Sheree Penne Lonni, MD;  Location: [facility] OR;  Service: Vascular;  Laterality: Left;   BASCILIC VEIN TRANSPOSITION Left 06/28/2023   Procedure: LEFT ARM BASILIC VEIN TRANSPOSITION;  Surgeon: Sheree Penne Lonni, MD;  Location: [facility] OR;  Service: Vascular;  Laterality: Left;   COLONOSCOPY N/A 02/04/2014   Procedure: COLONOSCOPY;  Surgeon: Lamar CHRISTELLA Hollingshead, MD;  [facility];  Service: Endoscopy;  Laterality: N/A;  12:00   COLONOSCOPY N/A 07/03/2019   Procedure: COLONOSCOPY;  Surgeon: Hollingshead Lamar CHRISTELLA, MD;  [facility];  Service: Endoscopy;  Laterality: N/A;  12:00-office rescheduled 2/9 @ 11:00am   INSERTION OF DIALYSIS CATHETER Right 03/01/2023  Procedure: INSERTION OF RIGHT INTERNAL JUGULAR TUNNELED DIALYSIS CATHETER;  Surgeon: Sheree Penne Bruckner, MD;  Location: [facility] OR;  Service: Vascular;  Laterality: Right;   IR FLUORO GUIDE CV LINE RIGHT  04/04/2023   IR FLUORO GUIDE CV LINE RIGHT  07/08/2023   IR FLUORO GUIDE CV LINE RIGHT  08/30/2023   IR US  GUIDE VASC ACCESS RIGHT  07/08/2023   POLYPECTOMY  07/03/2019   Procedure: POLYPECTOMY;  Surgeon: Shaaron Lamar HERO, MD;  [facility];  Service: Endoscopy;;   REVISON OF ARTERIOVENOUS FISTULA Right 01/25/2023   Procedure: REVISION OF RIGHT ARM  ARTERIOVENOUS FISTULA;  Surgeon: Sheree Penne Bruckner, MD;  Location: [facility] OR;  Service: Vascular;  Laterality: Right;   REVISON OF ARTERIOVENOUS FISTULA Right 03/01/2023   Procedure: INSERTION OF ARTERIOVENOUS GRAFT USING ARTEGRAFT  X 42CM;  Surgeon: Sheree Penne Bruckner, MD;  Location: [facility] OR;  Service: Vascular;  Laterality: Right;   REVISON OF ARTERIOVENOUS FISTULA Right 06/28/2023   Procedure: EXCISION OF RIGHT ARTERIOVENOUS FISTULA PSEUDOANEURYSM;  Surgeon: Sheree Penne Bruckner, MD;  Location: [facility] OR;  Service: Vascular;  Laterality: Right;   THROMBECTOMY W/ EMBOLECTOMY  01/25/2023   Procedure: THROMBECTOMY RIGHT ARTERIOVENOUS FISTULA WITH BALLOON ANGIOPLASTY;  Surgeon: Sheree Penne Bruckner, MD;  Location: [facility] OR;  Service: Vascular;;   UPPER EXTREMITY INTERVENTION  09/15/2023   Procedure: UPPER EXTREMITY INTERVENTION;  Surgeon: Gretta Bruckner PARAS, MD;  [facility];  Service: Cardiovascular;;   UPPER EXTREMITY VENOGRAPHY Bilateral 04/07/2023   Procedure: UPPER EXTREMITY VENOGRAPHY;  Surgeon: Gretta Bruckner PARAS, MD;  [facility];  Service: Cardiovascular;  Laterality: Bilateral;   VENOGRAM Right 01/25/2023   Procedure: RIGHT ARM FISTULOGRAM;  Surgeon: Sheree Penne Bruckner, MD;  Location: [facility] OR;  Service: Vascular;  Laterality: Right;   VENOUS ANGIOPLASTY  10/13/2023   Procedure: VENOUS ANGIOPLASTY;  Surgeon: Lanis Fonda BRAVO, MD;  Location: [facility];  Service: Cardiovascular;;   VENOUS ANGIOPLASTY Left 02/20/2024   Procedure: VENOUS ANGIOPLASTY;  Surgeon: Magda Debby SAILOR, MD;  Location: [facility];  Service: Cardiovascular;  Laterality: Left;  Outflow Basilic Vein   VENOUS STENT Left 02/20/2024   Procedure: VENOUS STENT;  Surgeon: Magda Debby SAILOR, MD;  Location: [facility];  Service: Cardiovascular;  Laterality: Left;  8x7.5 Viabahn    Short Social History:  Social History   Tobacco Use   Smoking status: Former    Current  packs/day: 0.00    Average packs/day: 0.5 packs/day for 38.6 years (19.3 ttl pk-yrs)    Types: Cigarettes    Start date: 09/23/1985    Quit date: 05/18/2024    Years since quitting: 0.0   Smokeless tobacco: Never   Tobacco comments:    8-10 ciggs per day   Substance Use Topics   Alcohol  use: Not Currently    Comment: quit etoh about 2011. used to drink about 8-10 beers a week    Allergies[1]  Current Outpatient Medications  Medication Sig Dispense Refill   acetaminophen  (TYLENOL ) 500 MG tablet Take 1,000 mg by mouth every 6 (six) hours as needed for mild pain (pain score 1-3), moderate pain (pain score 4-6) or headache.     albuterol  (ACCUNEB ) 1.25 MG/3ML nebulizer solution Take 1 ampule by nebulization every 4 (four) hours as needed for wheezing or shortness of breath.     aspirin EC 81 MG tablet Take 81 mg by mouth in the morning.     atorvastatin (LIPITOR) 10 MG tablet Take 10  mg by mouth every evening.     AURYXIA 1 GM 210 MG(Fe) tablet Take 420 mg by mouth 3 (three) times daily with meals. And 420 mg with each snack     cinacalcet  (SENSIPAR ) 60 MG tablet Take 60 mg by mouth every Monday, Wednesday, and Friday with hemodialysis.     diphenhydramine -acetaminophen  (TYLENOL  PM) 25-500 MG TABS tablet Take 1 tablet by mouth at bedtime as needed (sleep).     DULoxetine (CYMBALTA) 60 MG capsule Take 60 mg by mouth in the morning.     lidocaine -prilocaine (EMLA) cream Apply 1 Application topically daily as needed (fistula acces).     metoprolol  tartrate (LOPRESSOR ) 25 MG tablet Take 3 tablets (75 mg total) by mouth 2 (two) times daily. 180 tablet 11   midodrine (PROAMATINE) 5 MG tablet Take 5 mg by mouth every Monday, Wednesday, and Friday. (Patient not taking: Reported on 06/01/2024)     Multiple Vitamins-Minerals (MULTIVITAMIN WITH MINERALS) tablet Take 1 tablet by mouth daily. Men 50+     Omega-3 1000 MG CAPS Take 2,000 mg by mouth daily.     oxyCODONE -acetaminophen  (PERCOCET) 10-325 MG  tablet Take 1 tablet by mouth every 4 (four) hours as needed for pain.     pregabalin (LYRICA) 100 MG capsule Take 100 mg by mouth 2 (two) times daily.     sevelamer carbonate (RENVELA) 800 MG tablet Take 1,600-3,200 mg by mouth See admin instructions. Take 3200 mg with meals and 1600 with snack     sildenafil (REVATIO) 20 MG tablet Take 40-60 mg by mouth daily as needed (ED).     SYMBICORT  160-4.5 MCG/ACT inhaler Inhale 2 puffs into the lungs 2 (two) times daily as needed (shortness of breath).     No current facility-administered medications for this visit.    Review of Systems  Constitutional: Positive for fatigue.  HENT: HENT negative.  Eyes: Eyes negative.  Cardiovascular: Positive for leg swelling.  GI: Gastrointestinal negative.  Musculoskeletal: Positive for leg pain.  Skin: Positive for wound.  Neurological: Neurological negative. Hematologic: Hematologic/lymphatic negative.  Psychiatric: Psychiatric negative.        Objective:  Objective   Vitals:   06/06/24 1418  BP: 119/78  Pulse: 89  Temp: 98.5 F (36.9 C)  SpO2: 94%  Weight: 259 lb (117.5 kg)  Height: 6' (1.829 m)   Body mass index is 35.13 kg/m.  Physical Exam HENT:     Head: Normocephalic.     Nose: Nose normal.  Eyes:     Pupils: Pupils are equal, round, and reactive to light.  Cardiovascular:     Pulses:          Femoral pulses are 1+ on the right side and 1+ on the left side.      Popliteal pulses are 0 on the right side and 0 on the left side.  Pulmonary:     Effort: Pulmonary effort is normal.  Musculoskeletal:     Comments: Thrill left upper arm AV fistula  Skin:    Capillary Refill: Capillary refill takes less than 2 seconds.     Comments: Pretibial wounds with eschar bilaterally  Neurological:     General: No focal deficit present.     Mental Status: He is alert.  Psychiatric:        Mood and Affect: Mood normal.     Data:      Assessment/Plan:     54 year old male with  pretibial wounds bilaterally.  Multifactorial nature but with 0  to oh pressure on the left and no pulses below the femorals bilaterally.  I recommended angiography from a right common femoral approach to evaluate the flow in both lower extremities and possibly treat the right.  Patient understands that he is high risk for major amputation and demonstrates good understanding of the risk benefits of proceeding with angiography and this will be scheduled on a nondialysis day possibly with one of my partners in the near future.     Penne Lonni Colorado MD [facility]       [1] No Known Allergies  "

## 2024-06-07 ENCOUNTER — Encounter (INDEPENDENT_AMBULATORY_CARE_PROVIDER_SITE_OTHER): Payer: Self-pay | Admitting: *Deleted

## 2024-06-07 ENCOUNTER — Telehealth: Payer: Self-pay

## 2024-06-07 ENCOUNTER — Other Ambulatory Visit: Payer: Self-pay

## 2024-06-07 DIAGNOSIS — I7025 Atherosclerosis of native arteries of other extremities with ulceration: Secondary | ICD-10-CM

## 2024-06-07 NOTE — Telephone Encounter (Signed)
 Attempted to call for surgery scheduling. LVM

## 2024-06-14 ENCOUNTER — Other Ambulatory Visit: Payer: Self-pay | Admitting: Physician Assistant

## 2024-06-21 ENCOUNTER — Other Ambulatory Visit: Payer: Self-pay

## 2024-06-21 ENCOUNTER — Encounter (HOSPITAL_COMMUNITY): Admission: RE | Disposition: A | Payer: Self-pay | Source: Home / Self Care | Attending: Vascular Surgery

## 2024-06-21 ENCOUNTER — Encounter (HOSPITAL_COMMUNITY): Payer: Self-pay | Admitting: Vascular Surgery

## 2024-06-21 ENCOUNTER — Ambulatory Visit (HOSPITAL_COMMUNITY)
Admission: RE | Admit: 2024-06-21 | Discharge: 2024-06-21 | Disposition: A | Discharge status: Home / Self Care | Attending: Vascular Surgery | Admitting: Vascular Surgery

## 2024-06-21 DIAGNOSIS — I7025 Atherosclerosis of native arteries of other extremities with ulceration: Secondary | ICD-10-CM

## 2024-06-21 LAB — POCT I-STAT, CHEM 8
BUN: 45 mg/dL — ABNORMAL HIGH (ref 6–20)
Calcium, Ion: 1.14 mmol/L — ABNORMAL LOW (ref 1.15–1.40)
Chloride: 100 mmol/L (ref 98–111)
Creatinine, Ser: 11.3 mg/dL — ABNORMAL HIGH (ref 0.61–1.24)
Glucose, Bld: 105 mg/dL — ABNORMAL HIGH (ref 70–99)
HCT: 51 % (ref 39.0–52.0)
Hemoglobin: 17.3 g/dL — ABNORMAL HIGH (ref 13.0–17.0)
Potassium: 4.8 mmol/L (ref 3.5–5.1)
Sodium: 141 mmol/L (ref 135–145)
TCO2: 29 mmol/L (ref 22–32)

## 2024-06-21 MED ORDER — FENTANYL CITRATE (PF) 100 MCG/2ML IJ SOLN
INTRAMUSCULAR | Status: DC | PRN
  Administered 2024-06-21 (×3): 25 ug via INTRAVENOUS

## 2024-06-21 MED ORDER — SODIUM CHLORIDE 0.9 % IV SOLN: 250.0000 mL | INTRAVENOUS | Status: DC | PRN

## 2024-06-21 MED ORDER — ONDANSETRON HCL 4 MG/2ML IJ SOLN: 4.0000 mg | Freq: Four times a day (QID) | INTRAMUSCULAR | Status: DC | PRN

## 2024-06-21 MED ORDER — IODIXANOL 320 MG/ML IV SOLN
INTRAVENOUS | Status: DC | PRN
  Administered 2024-06-21: 140 mL

## 2024-06-21 MED ORDER — CLOPIDOGREL BISULFATE 300 MG PO TABS
ORAL_TABLET | ORAL | Status: AC
  Filled 2024-06-21: qty 1

## 2024-06-21 MED ORDER — SODIUM CHLORIDE 0.9% FLUSH: 3.0000 mL | INTRAVENOUS | Status: DC | PRN

## 2024-06-21 MED ORDER — LIDOCAINE HCL (PF) 1 % IJ SOLN
INTRAMUSCULAR | Status: AC
  Filled 2024-06-21: qty 30

## 2024-06-21 MED ORDER — CLOPIDOGREL BISULFATE 75 MG PO TABS: 75.0000 mg | ORAL_TABLET | Freq: Every day | ORAL | Status: DC

## 2024-06-21 MED ORDER — CLOPIDOGREL BISULFATE 75 MG PO TABS: 300.0000 mg | ORAL_TABLET | Freq: Once | ORAL | Status: DC

## 2024-06-21 MED ORDER — MIDAZOLAM HCL (PF) 2 MG/2ML IJ SOLN
INTRAMUSCULAR | Status: DC | PRN
  Administered 2024-06-21: 1 mg via INTRAVENOUS

## 2024-06-21 MED ORDER — ASPIRIN 81 MG PO CHEW
CHEWABLE_TABLET | ORAL | Status: AC
  Filled 2024-06-21: qty 1

## 2024-06-21 MED ORDER — LABETALOL HCL 5 MG/ML IV SOLN: 10.0000 mg | INTRAVENOUS | Status: DC | PRN

## 2024-06-21 MED ORDER — CLOPIDOGREL BISULFATE 75 MG PO TABS: 75.0000 mg | ORAL_TABLET | Freq: Every day | ORAL | 11 refills | Status: AC

## 2024-06-21 MED ORDER — ACETAMINOPHEN 325 MG PO TABS: 650.0000 mg | ORAL_TABLET | ORAL | Status: DC | PRN

## 2024-06-21 MED ORDER — FENTANYL CITRATE (PF) 100 MCG/2ML IJ SOLN
INTRAMUSCULAR | Status: AC
  Filled 2024-06-21: qty 2

## 2024-06-21 MED ORDER — HEPARIN SODIUM (PORCINE) 1000 UNIT/ML IJ SOLN
INTRAMUSCULAR | Status: DC | PRN
  Administered 2024-06-21: 10000 [IU] via INTRAVENOUS

## 2024-06-21 MED ORDER — HEPARIN (PORCINE) IN NACL 1000-0.9 UT/500ML-% IV SOLN
INTRAVENOUS | Status: DC | PRN
  Administered 2024-06-21: 1000 mL

## 2024-06-21 MED ORDER — LIDOCAINE HCL (PF) 1 % IJ SOLN
INTRAMUSCULAR | Status: DC | PRN
  Administered 2024-06-21: 10 mL via INTRADERMAL

## 2024-06-21 MED ORDER — MIDAZOLAM HCL 2 MG/2ML IJ SOLN
INTRAMUSCULAR | Status: AC
  Filled 2024-06-21: qty 2

## 2024-06-21 MED ORDER — OXYCODONE HCL 5 MG PO TABS: 5.0000 mg | ORAL_TABLET | ORAL | Status: DC | PRN

## 2024-06-21 MED ORDER — SODIUM CHLORIDE 0.9% FLUSH: 3.0000 mL | Freq: Two times a day (BID) | INTRAVENOUS | Status: DC

## 2024-06-21 MED ORDER — HYDRALAZINE HCL 20 MG/ML IJ SOLN: 5.0000 mg | INTRAMUSCULAR | Status: DC | PRN

## 2024-06-21 MED ORDER — HEPARIN SODIUM (PORCINE) 1000 UNIT/ML IJ SOLN
INTRAMUSCULAR | Status: AC
  Filled 2024-06-21: qty 10

## 2024-06-21 NOTE — H&P (Signed)
 History and Physical Interval Note:  06/21/2024 10:26 AM  Brian Barrera  has presented today for surgery, with the diagnosis of atherosclerosis bilateral with ulcers.  The various methods of treatment have been discussed with the patient and family. After consideration of risks, benefits and other options for treatment, the patient has consented to  Procedures: ABDOMINAL AORTOGRAM W/LOWER EXTREMITY (N/A) LOWER EXTREMITY INTERVENTION (N/A) as a surgical intervention.  The patient's history has been reviewed, patient examined, no change in status, stable for surgery.  I have reviewed the patient's chart and labs.  Questions were answered to the patient's satisfaction.     Brian Barrera     Patient ID: Brian Barrera, male   DOB: 18-Aug-1970, 54 y.o.   MRN: [id number]   Reason for Consult: Follow-up   Referred by Lari Elspeth FORBES, MD   Subjective:    Subjective[] Expand by Default HPI:   Brian Barrera is a 54 y.o. male well-known to our service line from a history of end-stage renal disease currently dialyzing Monday Wednesday and Friday via left arm AV fistula.  More recently he has wounds on his bilateral legs left greater than right.  He states the right is actually healing some.  He has associated swelling.  He denies any fevers or chills.       Past Medical History:  Diagnosis Date   Anemia      Attributed to chronic disease/chronic kidney disease   Asthma     CHF (congestive heart failure) (HCC)     Chronic diastolic heart failure (HCC) 05/24/2009    EF 35% by echo in 2011; echo in 08/2011-normal EF, moderate to severe LVH; BNP level of [phone] in 2011-12   Chronic obstructive pulmonary disease (HCC)      with asthmatic component   Dysrhythmia     ESRD (end stage renal disease) on dialysis (HCC)      secondary hyperparathyroidism   Hypertension      h/o hypertensive crisis with encephalopathy   Obesity     Tobacco abuse      Approximate consumption of 25  pack years; continuing at 0.5 pack per day             Family History  Problem Relation Age of Onset   Heart disease Father     Diabetes Sister     Hypertension Sister     Diabetes Sister     Hypertension Sister     Kidney disease Mother     Hypertension Mother     Hypertension Sister     Hypertension Brother     Hypertension Brother     Hypertension Brother     Colon cancer Neg Hx               Past Surgical History:  Procedure Laterality Date   A/V FISTULAGRAM Left 09/15/2023    Procedure: A/V Fistulagram;  Surgeon: Barrera Brian JINNY, MD;  [facility];  Service: Cardiovascular;  Laterality: Left;   A/V FISTULAGRAM Left 02/20/2024    Procedure: A/V Fistulagram;  Surgeon: Magda Debby SAILOR, MD;  Location: [facility];  Service: Cardiovascular;  Laterality: Left;   A/V FISTULAGRAM Left 03/26/2024    Procedure: A/V Fistulagram;  Surgeon: Magda Debby SAILOR, MD;  Location: [facility];  Service: Cardiovascular;  Laterality: Left;   A/V SHUNT INTERVENTION N/A 10/13/2023    Procedure: A/V SHUNT INTERVENTION;  Surgeon: Lanis Fonda FORBES, MD;  Location: [facility];  Service: Cardiovascular;  Laterality: N/A;   A/V SHUNT INTERVENTION N/A 02/20/2024    Procedure: A/V SHUNT INTERVENTION;  Surgeon: Magda Debby SAILOR, MD;  Location: [facility];  Service: Cardiovascular;  Laterality: N/A;   ARTERIOVENOUS GRAFT PLACEMENT   05/24/2009   AV FISTULA PLACEMENT Left 05/03/2023    Procedure: LEFT ARM BASILIC VEIN FISTULA CREATION;  Surgeon: Sheree Penne Bruckner, MD;  Location: [facility] OR;  Service: Vascular;  Laterality: Left;   BASCILIC VEIN TRANSPOSITION Left 06/28/2023    Procedure: LEFT ARM BASILIC VEIN TRANSPOSITION;  Surgeon: Sheree Penne Bruckner, MD;  Location: [facility] OR;  Service: Vascular;  Laterality: Left;   COLONOSCOPY N/A 02/04/2014    Procedure: COLONOSCOPY;  Surgeon: Lamar CHRISTELLA Hollingshead, MD;  [facility];  Service: Endoscopy;  Laterality: N/A;  12:00    COLONOSCOPY N/A 07/03/2019    Procedure: COLONOSCOPY;  Surgeon: Hollingshead Lamar CHRISTELLA, MD;  [facility];  Service: Endoscopy;  Laterality: N/A;  12:00-office rescheduled 2/9 @ 11:00am   INSERTION OF DIALYSIS CATHETER Right 03/01/2023    Procedure: INSERTION OF RIGHT INTERNAL JUGULAR TUNNELED DIALYSIS CATHETER;  Surgeon: Sheree Penne Bruckner, MD;  Location: [facility] OR;  Service: Vascular;  Laterality: Right;   IR FLUORO GUIDE CV LINE RIGHT   04/04/2023   IR FLUORO GUIDE CV LINE RIGHT   07/08/2023   IR FLUORO GUIDE CV LINE RIGHT   08/30/2023   IR US  GUIDE VASC ACCESS RIGHT   07/08/2023   POLYPECTOMY   07/03/2019    Procedure: POLYPECTOMY;  Surgeon: Hollingshead Lamar CHRISTELLA, MD;  [facility];  Service: Endoscopy;;   REVISON OF ARTERIOVENOUS FISTULA Right 01/25/2023    Procedure: REVISION OF RIGHT ARM ARTERIOVENOUS FISTULA;  Surgeon: Sheree Penne Bruckner, MD;  Location: [facility] OR;  Service: Vascular;  Laterality: Right;   REVISON OF ARTERIOVENOUS FISTULA Right 03/01/2023    Procedure: INSERTION OF ARTERIOVENOUS GRAFT USING ARTEGRAFT  X 42CM;  Surgeon: Sheree Penne Bruckner, MD;  Location: [facility] OR;  Service: Vascular;  Laterality: Right;   REVISON OF ARTERIOVENOUS FISTULA Right 06/28/2023    Procedure: EXCISION OF RIGHT ARTERIOVENOUS FISTULA PSEUDOANEURYSM;  Surgeon: Sheree Penne Bruckner, MD;  Location: [facility] OR;  Service: Vascular;  Laterality: Right;   THROMBECTOMY W/ EMBOLECTOMY   01/25/2023    Procedure: THROMBECTOMY RIGHT ARTERIOVENOUS FISTULA WITH BALLOON ANGIOPLASTY;  Surgeon: Sheree Penne Bruckner, MD;  Location: [facility] OR;  Service: Vascular;;   UPPER EXTREMITY INTERVENTION   09/15/2023    Procedure: UPPER EXTREMITY INTERVENTION;  Surgeon: Gretta Bruckner PARAS, MD;  [facility];  Service: Cardiovascular;;   UPPER EXTREMITY VENOGRAPHY Bilateral 04/07/2023    Procedure: UPPER EXTREMITY VENOGRAPHY;  Surgeon: Gretta Bruckner PARAS, MD;  [facility];  Service: Cardiovascular;  Laterality: Bilateral;   VENOGRAM Right 01/25/2023    Procedure: RIGHT ARM FISTULOGRAM;  Surgeon: Sheree Penne Bruckner, MD;  Location: [facility] OR;  Service: Vascular;  Laterality: Right;   VENOUS ANGIOPLASTY   10/13/2023    Procedure: VENOUS ANGIOPLASTY;  Surgeon: Lanis Fonda BRAVO, MD;  Location: [facility];  Service: Cardiovascular;;   VENOUS ANGIOPLASTY Left 02/20/2024    Procedure: VENOUS ANGIOPLASTY;  Surgeon: Magda Debby SAILOR, MD;  Location: [facility];  Service: Cardiovascular;  Laterality: Left;  Outflow Basilic Vein   VENOUS STENT Left 02/20/2024    Procedure: VENOUS STENT;  Surgeon: Magda Debby SAILOR, MD;  Location: [facility];  Service: Cardiovascular;  Laterality: Left;  8x7.5 Viabahn  Short Social History:  Social History         Tobacco Use   Smoking status: Former      Current packs/day: 0.00      Average packs/day: 0.5 packs/day for 38.6 years (19.3 ttl pk-yrs)      Types: Cigarettes      Start date: 09/23/1985      Quit date: 05/18/2024      Years since quitting: 0.0   Smokeless tobacco: Never   Tobacco comments:      8-10 ciggs per day   Substance Use Topics   Alcohol  use: Not Currently      Comment: quit etoh about 2011. used to drink about 8-10 beers a week      [Allergies]  [Allergies] No Known Allergies         Current Outpatient Medications  Medication Sig Dispense Refill   acetaminophen  (TYLENOL ) 500 MG tablet Take 1,000 mg by mouth every 6 (six) hours as needed for mild pain (pain score 1-3), moderate pain (pain score 4-6) or headache.       albuterol  (ACCUNEB ) 1.25 MG/3ML nebulizer solution Take 1 ampule by nebulization every 4 (four) hours as needed for wheezing or shortness of breath.       aspirin  EC 81 MG tablet Take 81 mg by mouth in the morning.       atorvastatin (LIPITOR) 10 MG tablet Take 10 mg by mouth every evening.       AURYXIA 1 GM 210 MG(Fe) tablet Take 420 mg by mouth 3 (three) times daily with  meals. And 420 mg with each snack       cinacalcet  (SENSIPAR ) 60 MG tablet Take 60 mg by mouth every Monday, Wednesday, and Friday with hemodialysis.       diphenhydramine -acetaminophen  (TYLENOL  PM) 25-500 MG TABS tablet Take 1 tablet by mouth at bedtime as needed (sleep).       DULoxetine (CYMBALTA) 60 MG capsule Take 60 mg by mouth in the morning.       lidocaine -prilocaine (EMLA) cream Apply 1 Application topically daily as needed (fistula acces).       metoprolol  tartrate (LOPRESSOR ) 25 MG tablet Take 3 tablets (75 mg total) by mouth 2 (two) times daily. 180 tablet 11   midodrine (PROAMATINE) 5 MG tablet Take 5 mg by mouth every Monday, Wednesday, and Friday. (Patient not taking: Reported on 06/01/2024)       Multiple Vitamins-Minerals (MULTIVITAMIN WITH MINERALS) tablet Take 1 tablet by mouth daily. Men 50+       Omega-3 1000 MG CAPS Take 2,000 mg by mouth daily.       oxyCODONE -acetaminophen  (PERCOCET) 10-325 MG tablet Take 1 tablet by mouth every 4 (four) hours as needed for pain.       pregabalin (LYRICA) 100 MG capsule Take 100 mg by mouth 2 (two) times daily.       sevelamer carbonate (RENVELA) 800 MG tablet Take 1,600-3,200 mg by mouth See admin instructions. Take 3200 mg with meals and 1600 with snack       sildenafil (REVATIO) 20 MG tablet Take 40-60 mg by mouth daily as needed (ED).       SYMBICORT  160-4.5 MCG/ACT inhaler Inhale 2 puffs into the lungs 2 (two) times daily as needed (shortness of breath).          No current facility-administered medications for this visit.        Review of Systems  Constitutional: Positive for fatigue.  HENT: HENT negative.  Eyes: Eyes negative.  Cardiovascular: Positive for leg swelling.  GI: Gastrointestinal negative.  Musculoskeletal: Positive for leg pain.  Skin: Positive for wound.  Neurological: Neurological negative. Hematologic: Hematologic/lymphatic negative.  Psychiatric: Psychiatric negative.          Objective:     Objective    Vitals:    06/06/24 1418  BP: 119/78  Pulse: 89  Temp: 98.5 F (36.9 C)  SpO2: 94%  Weight: 259 lb (117.5 kg)  Height: 6' (1.829 m)    Body mass index is 35.13 kg/m.   Physical Exam HENT:     Head: Normocephalic.     Nose: Nose normal.  Eyes:     Pupils: Pupils are equal, round, and reactive to light.  Cardiovascular:     Pulses:          Femoral pulses are 1+ on the right side and 1+ on the left side.      Popliteal pulses are 0 on the right side and 0 on the left side.  Pulmonary:     Effort: Pulmonary effort is normal.  Musculoskeletal:     Comments: Thrill left upper arm AV fistula  Skin:    Capillary Refill: Capillary refill takes less than 2 seconds.     Comments: Pretibial wounds with eschar bilaterally  Neurological:     General: No focal deficit present.     Mental Status: He is alert.  Psychiatric:        Mood and Affect: Mood normal.       Data:       Assessment/Plan:    Assessment 54 year old male with pretibial wounds bilaterally.  Multifactorial nature but with 0 toe pressure on the left and no pulses below the femorals bilaterally.  I recommended angiography from a right common femoral approach to evaluate the flow in both lower extremities and possibly treat the left first.  Patient understands that he is high risk for major amputation and demonstrates good understanding of the risk benefits of proceeding with angiography and this will be scheduled on a nondialysis day possibly with one of my partners in the near future.       Penne Brian Colorado MD [facility]

## 2024-06-21 NOTE — Op Note (Signed)
 "   Patient name: Brian Barrera MRN: [id number] DOB: 02-Jul-1970 Sex: male  06/21/2024 Pre-operative Diagnosis: Bilateral lower extremity pretibial wounds Post-operative diagnosis:  Same Surgeon:  Lonni DOROTHA Gaskins, MD Procedure Performed: 1.  Ultrasound-guided access right common femoral artery 2.  Aortogram with catheter selection of aorta 3.  Left lower extremity arteriogram with catheter selection of the left popliteal artery and anterior tibial artery 4.  Left anterior tibial artery angioplasty including shockwave lithotripsy (3 mm x 80 mm shockwave balloon x 400 pulses and 2.5 mm x 100 mm coyote near the ankle) 5.  Right lower extremity arteriogram with runoff from the right femoral sheath 6.  Mynx closure right common femoral artery 7.  76 minutes of monitored moderate conscious sedation time  Indications: Patient is a 54 year old male with end-stage renal disease seen in the office by Dr. Sheree with bilateral lower extremity pretibial wounds.  On the left he had a toe pressure of 0.  He presents for lower extremity arteriogram with initial focus on the left leg but we also discussed right leg runoff after risks benefits discussed.  Findings:   Ultrasound-guided access right common femoral artery.  Aortogram showed widely patent infrarenal aorta with patent bilateral iliac arteries.  The renal arteries were visualized but these were diseased and he has end-stage renal disease.  Left lower extremity runoff showed a widely patent common femoral, profunda, SFA, popliteal artery.  He has two-vessel runoff with dominant runoff through the posterior tibial inline flow into the foot and also a patent peroneal although this is much smaller.  The anterior tibial had at least a moderate 60 to 70% calcified stenosis throughout the proximal one third of the vessel.  Distally there was a subtotal occlusion in the mid to distal calf.  There were collaterals at the ankle with a occluded dorsalis  pedis.  Significant small vessel disease in the foot.  Ultimately I was able to get down the anterior tibial through the diseased segment including the subtotal occlusion and got a wire into the foot.  I elected shockwave lithotripsy of the entire proximal to mid anterior tibial with a 3 mm x 80 mm shockwave lithotripsy balloon x 400 pulses.  Distally at the ankle where the artery was smaller and branched I used a 2.5 mm coyote.  Flow was noted down the anterior tibial with outflow through collaterals at the ankle at completion.  The peroneal and posterior tibial remain widely patent.  Right lower extremity runoff showed a widely patent common femoral, profunda, SFA, above and below-knee popliteal artery with two-vessel runoff in the foot through the anterior tibial and posterior tibial artery.   Procedure:  The patient was identified in the holding area and taken to room 8.  The patient was then placed supine on the table and prepped and draped in the usual sterile fashion.  A time out was called.  Patient received Versed  and fentanyl  for conscious moderate sedation.  Vital signs are monitored including heart rate, respiratory rate, oxygenation blood pressure.  I was present for all of moderate sedation.  Ultrasound was used to evaluate the right common femoral artery.  It was patent .  A digital ultrasound image was acquired.  A micropuncture needle was used to access the right common femoral artery under ultrasound guidance.  An 018 wire was advanced without resistance and a micropuncture sheath was placed.  The 018 wire was removed and a benson wire was placed.  The micropuncture sheath was exchanged for  a 5 french sheath.  An omniflush catheter was advanced over the wire to the level of L-1.  An abdominal angiogram was obtained.  Next, using the omniflush catheter and a benson wire, the aortic bifurcation was crossed and the catheter was placed into theleft external iliac artery and left runoff was  obtained.  We had difficulty visualizing the tibial vessels so used a long 100 cm straight catheter down into the left popliteal artery to get dedicated images of the tibial vessels.  Ultimately elected for left anterior tibial intervention given he has a pretibial wound with belief in angiosome.  Patient was given 100 units/kg IV heparin .  I upsized to a 5 French catapult sheath in the right groin over the aortic bifurcation into the left SFA.  Ultimately I used a V18 wire to get into the anterior tibial on the left with a long quick cross catheter got all the way down into the distal anterior tibial at the ankle.  Hand-injection here confirmed that the dorsalis pedis was occluded into the foot with small vessel disease.  Ultimately I exchanged for a [place] core wire.  I then used a 3 mm x 80 mm shockwave lithotripsy balloon and treated the entire anterior tibial from the ostium all the way down the proximal mid vessel where it was quite large using a total of 400 pulses.  Distally I used a 2.5 mm x 100 mm coyote angioplasty balloon to nominal pressure for 2 minutes as the vessel was smaller.  At completion we were able to visualize flow down the anterior tibials.  The posterior tibial and peroneal were preserved.  Wires and catheters were removed.  We then got dedicated runoff in the right lower extremity from the right femoral sheath.  The pertinent findings are noted above.  He will require no intervention in the right leg.  A Mynx closure was deployed in the right groin.  Plan: The patient is optimized in the left lower extremity.  Discussed aspirin  statin Plavix .  Right lower extremity runoff showed inline flow into the foot with no intervention needed.  Arrange follow-up in the office in 4-6 weeks.   Lonni DOROTHA Gaskins, MD [facility] Office: [phone]   "

## 2024-06-21 NOTE — Discharge Instructions (Signed)
 Femoral Site Care The following information offers guidance on how to care for yourself after your procedure. Your health care provider may also give you more specific instructions. If you have problems or questions, contact your health care provider. What can I expect after the procedure? After the procedure, it is common to have bruising and tenderness at the incision site. This usually fades within 1-2 weeks. Follow these instructions at home: Incision site care  Follow instructions from your health care provider about how to take care of your incision site. Make sure you: Wash your hands with soap and water for at least 20 seconds before and after you change your bandage (dressing). If soap and water are not available, use hand sanitizer. Remove your dressing in 24 hours. Leave stitches (sutures), skin glue, or adhesive strips in place. These skin closures may need to stay in place for 2 weeks or longer. If adhesive strip edges start to loosen and curl up, you may trim the loose edges. Do not remove adhesive strips completely unless your health care provider tells you to do that. Do not take baths, swim, or use a hot tub for at least 1 week. You may shower 24 hours after the procedure or as told by your health care provider. Gently wash the incision site with plain soap and water. Pat the area dry with a clean towel. Do not rub the site. This may cause bleeding. Do not apply powder or lotion to the site. Keep the site clean and dry. Check your femoral site every day for signs of infection. Check for: Redness, swelling, or pain. Fluid or blood. Warmth. Pus or a bad smell. Activity If you were given a sedative during the procedure, it can affect you for several hours. Do not drive or operate machinery until your health care provider says that it is safe. Rest as told by your health care provider. Avoid sitting for a long time without moving. Get up to take short walks every 1-2 hours. This  is important to improve blood flow and breathing. Ask for help if you feel weak or unsteady. Return to your normal activities as told by your health care provider. Ask your health care provider what activities are safe for you and when you can return to work. Avoid activities that take a lot of effort for the first 2-3 days after your procedure, or as long as directed. Do not lift anything that is heavier than 10 lb (4.5 kg), or the limit that you are told, until your health care provider says that it is safe. General instructions Take over-the-counter and prescription medicines only as told by your health care provider. If you will be going home right after the procedure, plan to have a responsible adult care for you for the time you are told. This is important. Keep all follow-up visits. This is important. Contact a health care provider if: You have a fever or chills. You have any of these signs of infection at your incision site: Redness, swelling, or pain. Fluid or blood. Warmth. Pus or a bad smell. Get help right away if: The incision area swells very fast. The incision area is bleeding, and the bleeding does not stop when you hold steady pressure on the area. Your leg or foot becomes pale, cool, tingly, or numb. These symptoms may represent a serious problem that is an emergency. Do not wait to see if the symptoms will go away. Get medical help right away. Call your local emergency  services (911 in the [place] Do not drive yourself to the hospital. Summary After the procedure, it is common to have bruising and tenderness that fade within 1-2 weeks. Check your femoral site every day for signs of infection. Do not lift anything that is heavier than 10 lb (4.5 kg), or the limit that you are told, until your health care provider says that it is safe. Get help right away if the incision area swells very fast, you have bleeding at the incision area that does not stop, or your leg or foot  becomes pale, cool, or numb. This information is not intended to replace advice given to you by your health care provider. Make sure you discuss any questions you have with your health care provider. Document Revised: 01/28/2021 Document Reviewed: 06/29/2020 [employer] Patient Education  2024 [employer].

## 2024-08-14 ENCOUNTER — Encounter
